# Patient Record
Sex: Female | Born: 1970 | Race: White | Hispanic: No | Smoking: Never smoker
Health system: Southern US, Community
[De-identification: ages and names within clinical notes are randomized; demographics above are authoritative.]

## PROBLEM LIST (undated history)

## (undated) DIAGNOSIS — K219 Gastro-esophageal reflux disease without esophagitis: Secondary | ICD-10-CM

## (undated) DIAGNOSIS — G629 Polyneuropathy, unspecified: Secondary | ICD-10-CM

## (undated) DIAGNOSIS — K589 Irritable bowel syndrome without diarrhea: Secondary | ICD-10-CM

## (undated) DIAGNOSIS — E119 Type 2 diabetes mellitus without complications: Secondary | ICD-10-CM

## (undated) DIAGNOSIS — N200 Calculus of kidney: Secondary | ICD-10-CM

## (undated) DIAGNOSIS — F341 Dysthymic disorder: Secondary | ICD-10-CM

## (undated) DIAGNOSIS — H269 Unspecified cataract: Secondary | ICD-10-CM

## (undated) DIAGNOSIS — E1149 Type 2 diabetes mellitus with other diabetic neurological complication: Secondary | ICD-10-CM

## (undated) DIAGNOSIS — R87629 Unspecified abnormal cytological findings in specimens from vagina: Secondary | ICD-10-CM

## (undated) DIAGNOSIS — E785 Hyperlipidemia, unspecified: Secondary | ICD-10-CM

## (undated) DIAGNOSIS — F419 Anxiety disorder, unspecified: Secondary | ICD-10-CM

## (undated) HISTORY — PX: CHOLECYSTECTOMY: SHX55

## (undated) HISTORY — PX: ABDOMINOPLASTY: SUR9

## (undated) HISTORY — DX: Type 2 diabetes mellitus with other diabetic neurological complication: E11.49

## (undated) HISTORY — DX: Gastro-esophageal reflux disease without esophagitis: K21.9

## (undated) HISTORY — DX: Unspecified abnormal cytological findings in specimens from vagina: R87.629

## (undated) HISTORY — PX: COLONOSCOPY: SHX174

## (undated) HISTORY — DX: Anxiety disorder, unspecified: F41.9

## (undated) HISTORY — DX: Irritable bowel syndrome without diarrhea: K58.9

## (undated) HISTORY — DX: Hyperlipidemia, unspecified: E78.5

## (undated) HISTORY — DX: Polyneuropathy, unspecified: G62.9

## (undated) HISTORY — DX: Unspecified cataract: H26.9

## (undated) HISTORY — DX: Dysthymic disorder: F34.1

## (undated) HISTORY — DX: Calculus of kidney: N20.0

---

## 1997-11-27 ENCOUNTER — Emergency Department (HOSPITAL_COMMUNITY): Admission: EM | Admit: 1997-11-27 | Discharge: 1997-11-27 | Payer: Self-pay | Admitting: Emergency Medicine

## 1998-10-31 ENCOUNTER — Encounter: Payer: Self-pay | Admitting: Gastroenterology

## 1998-10-31 ENCOUNTER — Ambulatory Visit (HOSPITAL_COMMUNITY): Admission: RE | Admit: 1998-10-31 | Discharge: 1998-10-31 | Payer: Self-pay | Admitting: Gastroenterology

## 1998-11-24 ENCOUNTER — Encounter: Payer: Self-pay | Admitting: Emergency Medicine

## 1998-11-24 ENCOUNTER — Emergency Department (HOSPITAL_COMMUNITY): Admission: EM | Admit: 1998-11-24 | Discharge: 1998-11-24 | Payer: Self-pay | Admitting: Emergency Medicine

## 1999-03-22 ENCOUNTER — Emergency Department (HOSPITAL_COMMUNITY): Admission: EM | Admit: 1999-03-22 | Discharge: 1999-03-23 | Payer: Self-pay | Admitting: Emergency Medicine

## 1999-09-05 ENCOUNTER — Encounter: Admission: RE | Admit: 1999-09-05 | Discharge: 1999-12-04 | Payer: Self-pay | Admitting: Internal Medicine

## 1999-09-12 ENCOUNTER — Other Ambulatory Visit: Admission: RE | Admit: 1999-09-12 | Discharge: 1999-09-12 | Payer: Self-pay | Admitting: Gastroenterology

## 2006-01-04 ENCOUNTER — Emergency Department (HOSPITAL_COMMUNITY): Admission: EM | Admit: 2006-01-04 | Discharge: 2006-01-05 | Payer: Self-pay | Admitting: Emergency Medicine

## 2007-04-13 ENCOUNTER — Emergency Department (HOSPITAL_COMMUNITY): Admission: EM | Admit: 2007-04-13 | Discharge: 2007-04-13 | Payer: Self-pay | Admitting: Emergency Medicine

## 2007-07-02 ENCOUNTER — Ambulatory Visit (HOSPITAL_COMMUNITY): Admission: RE | Admit: 2007-07-02 | Discharge: 2007-07-02 | Payer: Self-pay | Admitting: Podiatry

## 2007-07-04 ENCOUNTER — Ambulatory Visit (HOSPITAL_COMMUNITY): Admission: RE | Admit: 2007-07-04 | Discharge: 2007-07-04 | Payer: Self-pay | Admitting: Podiatry

## 2007-11-28 ENCOUNTER — Encounter (HOSPITAL_BASED_OUTPATIENT_CLINIC_OR_DEPARTMENT_OTHER): Admission: RE | Admit: 2007-11-28 | Discharge: 2007-12-24 | Payer: Self-pay | Admitting: Internal Medicine

## 2007-12-25 ENCOUNTER — Encounter (HOSPITAL_BASED_OUTPATIENT_CLINIC_OR_DEPARTMENT_OTHER): Admission: RE | Admit: 2007-12-25 | Discharge: 2008-03-22 | Payer: Self-pay | Admitting: Internal Medicine

## 2008-03-11 ENCOUNTER — Ambulatory Visit (HOSPITAL_COMMUNITY): Admission: RE | Admit: 2008-03-11 | Discharge: 2008-03-11 | Payer: Self-pay | Admitting: Internal Medicine

## 2008-06-29 ENCOUNTER — Encounter (HOSPITAL_BASED_OUTPATIENT_CLINIC_OR_DEPARTMENT_OTHER): Admission: RE | Admit: 2008-06-29 | Discharge: 2008-08-12 | Payer: Self-pay | Admitting: Internal Medicine

## 2008-12-03 ENCOUNTER — Encounter (HOSPITAL_BASED_OUTPATIENT_CLINIC_OR_DEPARTMENT_OTHER): Admission: RE | Admit: 2008-12-03 | Discharge: 2009-01-31 | Payer: Self-pay | Admitting: General Surgery

## 2009-04-29 ENCOUNTER — Encounter
Admission: RE | Admit: 2009-04-29 | Discharge: 2009-07-28 | Payer: Self-pay | Admitting: Physical Medicine & Rehabilitation

## 2009-05-02 ENCOUNTER — Ambulatory Visit: Payer: Self-pay | Admitting: Advanced Practice Midwife

## 2009-05-02 ENCOUNTER — Inpatient Hospital Stay (HOSPITAL_COMMUNITY): Admission: AD | Admit: 2009-05-02 | Discharge: 2009-05-02 | Payer: Self-pay | Admitting: Nephrology

## 2009-05-06 ENCOUNTER — Emergency Department (HOSPITAL_COMMUNITY): Admission: EM | Admit: 2009-05-06 | Discharge: 2009-05-06 | Payer: Self-pay | Admitting: Emergency Medicine

## 2009-05-06 ENCOUNTER — Ambulatory Visit: Payer: Self-pay | Admitting: Physical Medicine & Rehabilitation

## 2009-05-20 ENCOUNTER — Ambulatory Visit: Payer: Self-pay | Admitting: Physical Medicine & Rehabilitation

## 2009-06-28 ENCOUNTER — Ambulatory Visit: Payer: Self-pay | Admitting: Physical Medicine & Rehabilitation

## 2009-07-27 ENCOUNTER — Emergency Department (HOSPITAL_COMMUNITY): Admission: EM | Admit: 2009-07-27 | Discharge: 2009-07-27 | Payer: Self-pay | Admitting: Emergency Medicine

## 2009-08-03 ENCOUNTER — Encounter
Admission: RE | Admit: 2009-08-03 | Discharge: 2009-08-05 | Payer: Self-pay | Admitting: Physical Medicine & Rehabilitation

## 2009-08-05 ENCOUNTER — Ambulatory Visit: Payer: Self-pay | Admitting: Physical Medicine & Rehabilitation

## 2009-08-05 ENCOUNTER — Encounter (HOSPITAL_BASED_OUTPATIENT_CLINIC_OR_DEPARTMENT_OTHER): Admission: RE | Admit: 2009-08-05 | Discharge: 2009-11-03 | Payer: Self-pay | Admitting: Internal Medicine

## 2009-08-08 ENCOUNTER — Encounter: Payer: Self-pay | Admitting: Family Medicine

## 2009-08-08 DIAGNOSIS — F325 Major depressive disorder, single episode, in full remission: Secondary | ICD-10-CM | POA: Insufficient documentation

## 2009-08-08 DIAGNOSIS — E118 Type 2 diabetes mellitus with unspecified complications: Secondary | ICD-10-CM | POA: Insufficient documentation

## 2009-08-08 DIAGNOSIS — E1152 Type 2 diabetes mellitus with diabetic peripheral angiopathy with gangrene: Secondary | ICD-10-CM

## 2009-08-08 DIAGNOSIS — F341 Dysthymic disorder: Secondary | ICD-10-CM

## 2009-08-08 DIAGNOSIS — E1149 Type 2 diabetes mellitus with other diabetic neurological complication: Secondary | ICD-10-CM

## 2009-08-08 HISTORY — DX: Dysthymic disorder: F34.1

## 2009-08-08 HISTORY — DX: Type 2 diabetes mellitus with other diabetic neurological complication: E11.49

## 2009-08-12 ENCOUNTER — Ambulatory Visit (HOSPITAL_COMMUNITY)
Admission: RE | Admit: 2009-08-12 | Discharge: 2009-08-12 | Payer: Self-pay | Source: Home / Self Care | Admitting: Internal Medicine

## 2009-08-19 ENCOUNTER — Ambulatory Visit: Payer: Self-pay | Admitting: Family Medicine

## 2009-08-19 ENCOUNTER — Encounter: Payer: Self-pay | Admitting: Family Medicine

## 2009-09-22 ENCOUNTER — Encounter: Payer: Self-pay | Admitting: *Deleted

## 2009-09-22 ENCOUNTER — Encounter (INDEPENDENT_AMBULATORY_CARE_PROVIDER_SITE_OTHER): Payer: Self-pay | Admitting: Family Medicine

## 2009-10-05 ENCOUNTER — Ambulatory Visit (HOSPITAL_COMMUNITY): Admission: RE | Admit: 2009-10-05 | Discharge: 2009-10-05 | Payer: Self-pay | Admitting: General Surgery

## 2009-10-10 ENCOUNTER — Encounter: Payer: Self-pay | Admitting: Family Medicine

## 2009-10-11 ENCOUNTER — Encounter
Admission: RE | Admit: 2009-10-11 | Discharge: 2009-10-11 | Payer: Self-pay | Source: Home / Self Care | Admitting: Physical Medicine & Rehabilitation

## 2009-10-11 ENCOUNTER — Ambulatory Visit: Payer: Self-pay | Admitting: Physical Medicine & Rehabilitation

## 2009-12-27 ENCOUNTER — Telehealth: Payer: Self-pay | Admitting: *Deleted

## 2009-12-28 ENCOUNTER — Encounter
Admission: RE | Admit: 2009-12-28 | Discharge: 2010-03-22 | Payer: Self-pay | Source: Home / Self Care | Attending: Physical Medicine & Rehabilitation | Admitting: Physical Medicine & Rehabilitation

## 2010-01-13 ENCOUNTER — Ambulatory Visit: Payer: Self-pay | Admitting: Physical Medicine & Rehabilitation

## 2010-02-02 ENCOUNTER — Encounter: Payer: Self-pay | Admitting: Family Medicine

## 2010-02-02 ENCOUNTER — Ambulatory Visit: Payer: Self-pay | Admitting: Family Medicine

## 2010-02-02 DIAGNOSIS — M549 Dorsalgia, unspecified: Secondary | ICD-10-CM | POA: Insufficient documentation

## 2010-02-02 DIAGNOSIS — K589 Irritable bowel syndrome without diarrhea: Secondary | ICD-10-CM

## 2010-02-02 HISTORY — DX: Irritable bowel syndrome, unspecified: K58.9

## 2010-02-14 ENCOUNTER — Ambulatory Visit: Payer: Self-pay | Admitting: Physical Medicine & Rehabilitation

## 2010-02-20 ENCOUNTER — Telehealth (INDEPENDENT_AMBULATORY_CARE_PROVIDER_SITE_OTHER): Payer: Self-pay | Admitting: *Deleted

## 2010-02-23 ENCOUNTER — Encounter: Payer: Self-pay | Admitting: Family Medicine

## 2010-03-08 ENCOUNTER — Ambulatory Visit: Payer: Self-pay | Admitting: Physical Medicine & Rehabilitation

## 2010-03-27 ENCOUNTER — Encounter
Admission: RE | Admit: 2010-03-27 | Discharge: 2010-04-25 | Payer: Self-pay | Source: Home / Self Care | Attending: Physical Medicine & Rehabilitation | Admitting: Physical Medicine & Rehabilitation

## 2010-03-28 ENCOUNTER — Ambulatory Visit
Admission: RE | Admit: 2010-03-28 | Discharge: 2010-03-28 | Payer: Self-pay | Source: Home / Self Care | Attending: Physical Medicine & Rehabilitation | Admitting: Physical Medicine & Rehabilitation

## 2010-04-16 ENCOUNTER — Encounter: Payer: Self-pay | Admitting: Obstetrics

## 2010-04-16 ENCOUNTER — Encounter (HOSPITAL_BASED_OUTPATIENT_CLINIC_OR_DEPARTMENT_OTHER): Payer: Self-pay | Admitting: General Surgery

## 2010-04-16 ENCOUNTER — Encounter: Payer: Self-pay | Admitting: Family Medicine

## 2010-04-17 ENCOUNTER — Encounter: Payer: Self-pay | Admitting: Obstetrics

## 2010-04-18 ENCOUNTER — Ambulatory Visit
Admission: RE | Admit: 2010-04-18 | Discharge: 2010-04-18 | Payer: Self-pay | Source: Home / Self Care | Attending: Physical Medicine & Rehabilitation | Admitting: Physical Medicine & Rehabilitation

## 2010-04-25 NOTE — Progress Notes (Signed)
Summary: results  Phone Note Call from Patient Call back at Home Phone [phone]   Caller: Patient Summary of Call: pt is asking for results labswork Initial call taken by: De Nurse,  February 20, 2010 11:11 AM  Follow-up for Phone Call        Pt calling back to inquire why she hasn't been contacted regarding recent labs,and referrals for cardiology and  gi  Follow-up by: Abundio Miu,  February 22, 2010 3:58 PM  Additional Follow-up for Phone Call Additional follow up Details #1::        settng up the appts now.  Will forward to MD Additional Follow-up by: Jone Baseman CMA,  February 23, 2010 11:42 AM    Additional Follow-up for Phone Call Additional follow up Details #2::    LMOVM for pt to call back.  Her GI appt is @ [facility] for 12.6.11 @ 9:30am.  And we have sent the referral to [facility] and vascular for appt.  They will contact her.  Britta Mccreedy informed pt of the above. Pt awaiting MD call Follow-up by: Jone Baseman CMA,  February 23, 2010 11:56 AM   Sent letter informing her of results

## 2010-04-25 NOTE — Letter (Signed)
Summary: Generic Letter  [facility]  [address]   [place], [place] [address]   Phone: [phone]  Fax: [phone]    02/23/2010  TAMEKO HALDER [address] [place], [place]  [address]  Dear Ms. Papania,    I have reviewed your recent labs and everything looks normal, which is great news!  Your appointment with GI has been scheduled for 02-28-10 at 9:30am.  The cardiology office that we contaced will be calling you to set up an appointment soon.  Please call the office if you have further questions.       Sincerely,   Alvia Grove DO   Appended Document: Generic Letter mailed

## 2010-04-25 NOTE — Progress Notes (Signed)
Summary: refill & referral  Phone Note Call from Patient Call back at Home Phone [phone] Message from:  Patient  Refills Requested: Medication #1:  GLIPIZIDE XL 5 MG XR24H-TAB one daily  Medication #2:  GLUCOPHAGE 1000 MG TABS one by mouth two times a day [facility] [address]   Initial call taken by: De Nurse,  December 27, 2009 3:03 PM Caller: Patient Summary of Call: pt is needing to be refered to GI doc (for colonoscopy) & neurologist at [facility]- Dr Yvonna Alanis - for her diabetic neuropathy [phone]) pt has medicaid and needs auth for her to go  also needs to be referred to sleep clinic for sleep apnea and stress test this is all from previous visit late afternoon preferable  Initial call taken by: De Nurse,  December 27, 2009 3:05 PM  Follow-up for Phone Call        Called and LMOVM for pt to call back and schedule appt for all these referrals requested.  Was last seen in May and noshowed her physical. Follow-up by: Jone Baseman CMA,  December 28, 2009 12:08 PM  Additional Follow-up for Phone Call Additional follow up Details #1::        I refilled 30 days worth & asked front office to make her an appt here asap Additional Follow-up by: Golden Circle RN,  January 02, 2010 3:57 PM

## 2010-04-25 NOTE — Assessment & Plan Note (Signed)
Summary: f/up,tcb   Vital Signs:  Patient profile:   40 year old female Weight:      279.31 pounds Temp:     98.3 degrees F Pulse rate:   81 / minute BP sitting:   135 / 76  Vitals Entered By: Jone Baseman CMA (February 02, 2010 4:20 PM) CC: f/u Is Patient Diabetic? Yes Did you bring your meter with you today? No Pain Assessment Patient in pain? yes     Location: all over Intensity: 4   Primary Care Provider:  . [facility]  CC:  f/u.  History of Present Illness: 40 yo female, last seen by Dr. Donia Pounds several months ago.  Has multiple concerns today.  Ist visit with me. 1.  History of DM2 (dx 10 years ago) with diabetic neuropathy of hands and feet.  Previously seeing Dr. Jeri Cos.  Has not had labs in >1 year.  States A1c has been 5 for last few years.  Fastings 95-109 per patient.   2. Chronic pain: Unknown cause,  sees Pain Mgmt/Dr. Larna Daughters and is on Percocet and Klonopin (did not tolerate Lyrica or Tramadol).   She has a diabetic wound, R great toe. Casted  in wound clinic. No osteomylitis on Xray per patient.   3. Hx of kidney stones, seen at [facility] ER.  Pain resolved but she did not strain her urine so is not sure if stone passed (2x3x71mm).  She was put on Flomax and told to follow up with Urology.  At that ER visit there was an incidental finding of an adrenal mass (17mm, likely adenoma), needs further eval with CT w/o contrast.   4. Patient sees Dr. Clearance Coots for gyn care - due for pap and mammo.   5..Patient also c/o chronic diarrhea - states whatever she eats or drinks, she has a loose watery BM in 1 hour.  Would like GI referral for poss IBS.  She also had a C-scope many years ago which was normal. She has family h/o Colon CA (MGM). 6.. Hx of methadone use.  Requesting cardiology referral.  No chest pain, but feels she may have caused heart problems due to her chronic use.   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 One By Mouth Two Times A Day 2)  Paxil 40 Mg Tabs (Paroxetine Hcl) .Marland Kitchen.. 1 1/2 By Mouth Daily 3)  Enalapril Maleate 20 Mg Tabs (Enalapril Maleate) .... One Daily 4)  Clonazepam 1 Mg Tabs (Clonazepam) .... One Daily 5)  Paxil 40 Mg Tabs (Paroxetine Hcl) .... 1.5 Tabs By Mouth Daily 6)  Hydrocodone-Acetaminophen 5-325 Mg Tabs (Hydrocodone-Acetaminophen) .... One Tab By Mouth Qid 7)  Glipizide 5 Mg Tabs (Glipizide) .Marland Kitchen.. 1 Pill By Mouth Daily  Allergies (verified): 1)  ! Codeine 2)  ! Vancomycin  Past History:  Past Medical History: Last updated: 08/08/2009 h/o methadone use for neuropathic pain, Pain specialist took off and currently  trying new modalities (Kirsteins) T2DM with neuropathy  Cholesterol checked and pap at [facility] 2010. Mammogram at [facility] 1996  Past Surgical History: Last updated: 08/08/2009 Abdominoplasty (2000) Cholecystectomy (2003)  Social History: Last updated: 02/02/2010 Lives in [place] with daughter Lillia Abed 1995) and mother Lurena Joiner [phone]).  Has 2 dogs, 1 cat, vegetarian, caucasian, has BA, separated, renting.  Denies ever EtOH, drugs, tobacco  Risk Factors: Alcohol Use: 0 (02/02/2010)  Risk Factors: Smoking Status: never (02/02/2010)   Prevention & Chronic Care Immunizations   Influenza vaccine: Not documented    Tetanus booster: Not documented    Pneumococcal vaccine: Not  documented  Other Screening   Pap smear: Not documented    Mammogram: Not documented   Mammogram action/deferral: Ordered  (02/02/2010)  Reports requested:   Last colonoscopy report requested.   Last Pap report requested.  Smoking status: never  (02/02/2010)  Diabetes Mellitus   HgbA1C: Not documented    Eye exam: Not documented    Foot exam: Not documented   High risk foot: Not documented   Foot care education: Not documented    Urine microalbumin/creatinine ratio: Not documented    Diabetes flowsheet reviewed?: Yes   Progress toward A1C goal: At goal  Lipids   Total Cholesterol: Not documented   LDL: Not documented   LDL Direct: Not documented   HDL: Not documented   Triglycerides: Not documented  Self-Management Support :    Diabetes self-management support: Not documented   Nursing Instructions: Schedule screening mammogram (see order) Request report of last Pap Request report of last colonoscopy   Social History: Lives in [place] with daughter Lillia Abed 1995) and mother Lurena Joiner [phone]).  Has 2 dogs, 1 cat, vegetarian, caucasian, has BA, separated, renting.  Denies ever EtOH, drugs, tobacco  Review of Systems       see hpi  Physical Exam  General:  vs reviewed alert and well-developed.   Lungs:  Normal respiratory effort, chest expands symmetrically. Lungs are clear to auscultation, no crackles or wheezes. Heart:  Normal rate and regular rhythm. S1 and S2 normal without gallop, murmur, click, rub or other extra sounds. Extremities:  in splint Neurologic:  alert & oriented X3 and cranial nerves II-XII intact.   Psych:  moderately anxious and judgment fair.     Impression & Recommendations:  Problem # 1:  LONG-TERM (CURRENT) USE OF OTHER MEDICATIONS (ICD-V58.69) history of methadone use. Pt has alot of anxiety today.  Complains of chest apin in the past, not currently.  Mother present today feels that most of her pain is situational.   Orders: Cardiology Referral (Cardiology) [facility]- Est  Level 4 ([address])  Problem # 2:  BACK PAIN, CHRONIC (ICD-724.5) per pain clinic Her updated medication list for this problem includes:    Hydrocodone-acetaminophen 5-325 Mg Tabs (Hydrocodone-acetaminophen) ..... One tab by mouth qid  Orders: [facility]- Est  Level 4 ([address])  Problem # 3:  IBS (ICD-564.1) Pt endorses hx of colonc ca in multiple relatives. Referr to GI for colonoscopy. Orders: [facility]- Est  Level 4 ([address])  Problem # 4:  BENIGN NEOPLASM OF ADRENAL GLAND (ICD-227.0) will need f/u CT to re-evaluate Orders: Sed Rate (ESR)-[facility] ([address]657) ANA-[facility] ([id number])  Complete Medication List: 1)  Glucophage 1000 Mg Tabs (Metformin hcl) .... One by mouth two times a day 2)  Paxil 40 Mg Tabs (Paroxetine hcl) .Marland Kitchen.. 1 1/2 by mouth daily 3)  Enalapril Maleate 20 Mg Tabs (Enalapril maleate) .... One daily 4)  Clonazepam 1 Mg Tabs (Clonazepam) .... One  daily 5)  Paxil 40 Mg Tabs (Paroxetine hcl) .... 1.5 tabs by mouth daily 6)  Hydrocodone-acetaminophen 5-325 Mg Tabs (Hydrocodone-acetaminophen) .... One tab by mouth qid 7)  Glipizide 5 Mg Tabs (Glipizide) .Marland Kitchen.. 1 pill by mouth daily  Other Orders: Mammogram (Screening) (Mammo) Gastroenterology Referral (GI)  Patient Instructions: 1)  Please stop the topamax as we discussed. 2)  I am going to draw some labs today.  I will get in touch with you when I get the results, this may take a few days. 3)  Please get your mammogram. 4)  I will send you to GI for a colonoscopy 5)  I will also send you to cardiology for your chest discomfort 6)  Please make an appointment with Dr. Pascal Lux for CBT 7)  Please follow up with me in 4-6 weeks Prescriptions: ENALAPRIL MALEATE 20 MG TABS (ENALAPRIL MALEATE) one daily  #30 x 5   Entered and Authorized by:   Alvia Grove DO   Signed by:   Alvia Grove DO on 02/02/2010   Method used:   Electronically to        [facility] [address] # [phone]* (retail)        [address]       [place], [place]  [address]       Ph: [phone]       Fax: [phone]   RxID:   [id number] PAXIL 40 MG TABS (PAROXETINE HCL) 1 1/2 by mouth daily  #90 x 5   Entered and Authorized by:   Alvia Grove DO   Signed by:   Alvia Grove DO on 02/02/2010   Method used:   Electronically to        [facility] [address] # [phone]* (retail)       [address]       [place], [place]  [address]       Ph: [phone]       Fax: [phone]   RxID:   [id number] GLUCOPHAGE 1000 MG TABS (METFORMIN HCL) one by mouth two times a day  #60 x 5   Entered and Authorized by:   Alvia Grove DO   Signed by:   Alvia Grove DO on 02/02/2010   Method used:   Electronically to        [facility] [address] # [phone]* (retail)       [address]       [place], [place]  [address]       Ph: [phone]       Fax: [phone]   RxID:   [id number] GLIPIZIDE 5 MG TABS (GLIPIZIDE) 1 pill by mouth daily  #30 x 1   Entered and Authorized by:   Alvia Grove DO   Signed by:   Alvia Grove DO on 02/02/2010   Method used:   Electronically to        [facility] [address] # [phone]* (retail)       [address]       [place], [place]  [address]       Ph: [phone]       Fax: [phone]   RxID:   [id number]    Orders Added: 1)  Mammogram (Screening) [Mammo] 2)  Sed Rate (ESR)-[facility] [85651] 3)  [facility] [[id number]] 4)  Gastroenterology Referral [GI] 5)  Cardiology Referral [Cardiology] 6)  [facility]- Est  Level 4 [[address]]

## 2010-04-25 NOTE — Consult Note (Signed)
Summary: [facility]  [facility]   Imported By: Bradly Bienenstock 10/06/2009 13:36:24  _____________________________________________________________________  External Attachment:    Type:   Image     Comment:   External Document

## 2010-04-25 NOTE — Miscellaneous (Signed)
Summary: eye exam  Clinical Lists Changes gave NPI for pt's diabetic eye exam ([phone])...Marland KitchenMarland KitchenGolden Circle RN  September 22, 2009 4:54 PM

## 2010-04-25 NOTE — Assessment & Plan Note (Signed)
Summary: New Patient   Vital Signs:  Patient profile:   40 year old female Temp:     98.6 degrees F oral Pulse rate:   97 / minute BP sitting:   138 / 79  (left arm) Cuff size:   regular  Vitals Entered By: Garen Grams LPN (Aug 19, 2009 3:40 PM) CC: New PAtient Is Patient Diabetic? Yes Did you bring your meter with you today? No Pain Assessment Patient in pain? no        CC:  New PAtient.  History of Present Illness: Patient here to establish care.  History of DM2 (dx 10 years ago) with diabetic neuropathy of hands and feet.  Previously seeing Dr. Jeri Cos.  Has not had labs in >1 year.  States A1c has been 5 for last few years.  Fastings 95-109 per patient.  For neuropathy she sees Pain Mgmt/Dr. Larna Daughters and is on Vicodin and Klonopin (did not tolerate Lyrica or Tramadol).    She has a diabetic wound, R great toe. Casted today in wound clinic. No osteomylitis on Xray per patient.   She recently had a kidney stone, seen at [facility] ER.  Pain resolved but she did not strain her urine so is not sure if stone passed (2x3x63mm).  She was put on Flomax and told to follow up with Urology.  At that ER visit there was an incidental finding of an adrenal mass (17mm, likely adenoma), needs further eval with CT w/o contrast.   Patient sees Dr. Clearance Coots for gyn care - due for pap and mammo.   Patient also c/o chronic diarrhea - states whatever she eats or drinks, she has a loose watery BM in 1 hour.  Would like GI referral for poss IBS.  She also had a C-scope many years ago which was normal. She has family h/o Colon CA (MGM).   Habits & Providers  Alcohol-Tobacco-Diet     Tobacco Status: never  Allergies: 1)  ! Codeine 2)  ! Vancomycin  Past History:  Past Medical History: Last updated: 08/08/2009 h/o methadone use for neuropathic pain, Pain specialist took off and currently trying new modalities (Kirsteins) T2DM with neuropathy  Cholesterol checked and pap at [facility]  2010. Mammogram at [facility] 1996  Past Surgical History: Last updated: 08/08/2009 Abdominoplasty (2000) Cholecystectomy (2003)  Social History: Last updated: 08/08/2009 Lives in [place] with daughter Lillia Abed 1995) and mother Lurena Joiner [phone]).  Has 2 dogs, 1 cat, vegetarian, caucasian, has BA in ___, separated, renting.  Denies ever EtOH, drugs, tobacco   Impression & Recommendations:  Problem # 1:  DIABETES MELLITUS, TYPE II, WITH NEUROLOGICAL COMPLICATIONS (ICD-250.60) Will check labs and advise patient to keep log x 2 weeks. Will follow up in 2 weeks for results.   Her updated medication list for this problem includes:    Glucophage 1000 Mg Tabs (Metformin hcl) ..... One by mouth two times a day    Glipizide Xl 5 Mg Xr24h-tab (Glipizide) ..... One daily    Enalapril Maleate 20 Mg Tabs (Enalapril maleate) ..... One daily  Future Orders: Lipid-[facility] ([id number]) ... 08/20/2010 Comp Met-[facility] ([id number]) ... 08/20/2010 A1C-[facility] (01027) ... 08/20/2010 Vit D, 25 OH-[facility] ([id number]) ... 08/20/2010 TSH-[facility] [phone]) ... 08/20/2010 UA Microalbumin-[facility] (56433) ... 08/20/2010  Problem # 2:  BENIGN NEOPLASM OF ADRENAL GLAND (ICD-227.0) Will evaluate further with CT w/o contrast  Orders: CT without Contrast (CT w/o contrast)  Problem # 3:  IRRITABLE BOWEL SYNDROME (ICD-564.1) Requesting GI referral for poss IBS and desires C-scope  Orders: Gastroenterology Referral (GI)  Problem # 4:  OPEN WOUND OF TOE, COMPLICATED (ICD-893.1) Continue follow up at Wound Clinic  Problem # 5:  DIABETIC PERIPHERAL NEUROPATHY (ICD-250.60) Will refill Topamax  Her updated medication list for this problem includes:    Glucophage 1000 Mg Tabs (Metformin hcl) ..... One by mouth two times a day    Glipizide Xl 5 Mg Xr24h-tab (Glipizide) ..... One daily    Enalapril Maleate 20 Mg Tabs (Enalapril maleate) ..... One daily  Problem # 6:  DEPRESSION/ANXIETY (ICD-300.4) Will refill Paxil. States doing  well. No SI/HI. Also has h/o OCD - med is controlling symptoms well per patient.  Complete Medication List: 1)  Glucophage 1000 Mg Tabs (Metformin hcl) .... One by mouth two times a day 2)  Paxil 40 Mg Tabs (Paroxetine hcl) .Marland Kitchen.. 1 1/2 by mouth daily 3)  Glipizide Xl 5 Mg Xr24h-tab (Glipizide) .... One daily 4)  Enalapril Maleate 20 Mg Tabs (Enalapril maleate) .... One daily 5)  Topamax 200 Mg Tabs (Topiramate) .... One daily 6)  Clonazepam 1 Mg Tabs (Clonazepam) .... One daily 7)  Topamax 200 Mg Tabs (Topiramate) .... One tablet daily 8)  Paxil 40 Mg Tabs (Paroxetine hcl) .... 1.5 tabs by mouth daily 9)  Hydrocodone-acetaminophen 5-325 Mg Tabs (Hydrocodone-acetaminophen) .... One tab by mouth qid Prescriptions: PAXIL 40 MG TABS (PAROXETINE HCL) 1.5 tabs by mouth daily  #45 x 11   Entered and Authorized by:   Jettie Pagan MD   Signed by:   Jettie Pagan MD on 08/19/2009   Method used:   Electronically to        [facility] [address] # [phone]* (retail)       [address]       [place], [place]  [address]       Ph: [phone]       Fax: [phone]   RxID:   [id number] TOPAMAX 200 MG TABS (TOPIRAMATE) one tablet daily  #30 x 11   Entered and Authorized by:   Jettie Pagan MD   Signed by:   Jettie Pagan MD on 08/19/2009   Method used:   Electronically to        [facility] [address] # [phone]* (retail)       [address]       [place], [place]  [address]       Ph: [phone]       Fax: [phone]   RxID:   [id number]

## 2010-04-25 NOTE — Miscellaneous (Signed)
Summary: NP input  Clinical Lists Changes  Problems: Added new problem of DEPRESSION/ANXIETY (ICD-300.4) Added new problem of DIABETES MELLITUS, TYPE II, WITH NEUROLOGICAL COMPLICATIONS (ICD-250.60) Added new problem of DIABETIC PERIPHERAL NEUROPATHY (ICD-250.60) Medications: Added new medication of GLUCOPHAGE 1000 MG TABS (METFORMIN HCL) one by mouth two times a day Added new medication of PAXIL 40 MG TABS (PAROXETINE HCL) 1 1/2 by mouth daily Added new medication of GLIPIZIDE XL 5 MG XR24H-TAB (GLIPIZIDE) one daily Added new medication of ENALAPRIL MALEATE 20 MG TABS (ENALAPRIL MALEATE) one daily Added new medication of TOPAMAX 200 MG TABS (TOPIRAMATE) one daily Added new medication of CLONAZEPAM 1 MG TABS (CLONAZEPAM) one daily Allergies: Added new allergy or adverse reaction of CODEINE Added new allergy or adverse reaction of VANCOMYCIN Observations: Added new observation of STD: never (08/08/2009 17:21) Added new observation of SMOK STATUS: never (08/08/2009 17:21) Added new observation of ALCOHOL USE: 0 /day (08/08/2009 17:21) Added new observation of DRUG USE: never  (08/08/2009 17:21) Added new observation of SEATBELT USE: always % (08/08/2009 17:21) Added new observation of SOCIAL HX: Lives in [place] with daughter Lillia Abed 1995) and mother Lurena Joiner [phone]).  Has 2 dogs, 1 cat, vegetarian, caucasian, has BA in ___, separated, renting.  Denies ever EtOH, drugs, tobacco  (08/08/2009 17:21) Added new observation of PAST SURG HX: Abdominoplasty (2000) Cholecystectomy (2003)  (08/08/2009 17:21) Added new observation of NKA: F  (08/08/2009 17:21) Added new observation of PAST MED HX: h/o methadone use for neuropathic pain, Pain specialist took off and currently trying new modalities (Kirsteins) T2DM with neuropathy  Cholesterol checked and pap at [facility] 2010. Mammogram at [place] 1996  (08/08/2009 17:21) Added new observation of PAINMGMTMD: Kirsteins  (08/08/2009  17:21)      Habits & Providers  Alcohol-Tobacco-Diet     Alcohol drinks/day: 0     Tobacco Status: never  Exercise-Depression-Behavior     STD Risk: never     Drug Use: never     Seat Belt Use: always     Pain Management Provider: Kirsteins    Past History:  Past Medical History: h/o methadone use for neuropathic pain, Pain specialist took off and currently trying new modalities (Kirsteins) T2DM with neuropathy  Cholesterol checked and pap at [facility] 2010. Mammogram at [facility]  Past Surgical History: Abdominoplasty (2000) Cholecystectomy (2003)   Social History: Lives in [place] with daughter Lillia Abed 1995) and mother Lurena Joiner [phone]).  Has 2 dogs, 1 cat, vegetarian, caucasian, has BA in ___, separated, renting.  Denies ever EtOH, drugs, tobaccoSeat Belt Use:  always Drug Use:  never Smoking Status:  never STD Risk:  never

## 2010-04-25 NOTE — Consult Note (Signed)
Summary: [facility]  [facility]   Imported By: Knox Royalty 10/15/2009 13:09:55  _____________________________________________________________________  External Attachment:    Type:   Image     Comment:   External Document

## 2010-05-30 ENCOUNTER — Ambulatory Visit (HOSPITAL_BASED_OUTPATIENT_CLINIC_OR_DEPARTMENT_OTHER): Payer: Medicaid Other | Admitting: Physical Medicine & Rehabilitation

## 2010-05-30 ENCOUNTER — Encounter: Payer: Medicaid Other | Attending: Physical Medicine & Rehabilitation

## 2010-05-30 DIAGNOSIS — E119 Type 2 diabetes mellitus without complications: Secondary | ICD-10-CM | POA: Insufficient documentation

## 2010-05-30 DIAGNOSIS — R209 Unspecified disturbances of skin sensation: Secondary | ICD-10-CM | POA: Insufficient documentation

## 2010-05-30 DIAGNOSIS — M79609 Pain in unspecified limb: Secondary | ICD-10-CM

## 2010-05-30 DIAGNOSIS — G609 Hereditary and idiopathic neuropathy, unspecified: Secondary | ICD-10-CM

## 2010-05-30 DIAGNOSIS — G8929 Other chronic pain: Secondary | ICD-10-CM | POA: Insufficient documentation

## 2010-05-30 DIAGNOSIS — Z79899 Other long term (current) drug therapy: Secondary | ICD-10-CM | POA: Insufficient documentation

## 2010-06-08 ENCOUNTER — Telehealth: Payer: Self-pay | Admitting: Family Medicine

## 2010-06-08 NOTE — Telephone Encounter (Signed)
Pt has medicaid & needs an rx for new machine & test strips, needs to be sure its one medicaid covers.

## 2010-06-09 NOTE — Telephone Encounter (Signed)
Left message on patient voicemail, instructing her to call back and schedule an appointment.

## 2010-06-09 NOTE — Telephone Encounter (Signed)
Last MD seen was Alvia Grove in November of last year, will forward to Dr. Deirdre Priest.

## 2010-06-09 NOTE — Telephone Encounter (Signed)
If she has not been seen since November and has DM she needs an appt first.  Can be sched with any blue team doc thnaks [name] L

## 2010-06-13 LAB — URINE MICROSCOPIC-ADD ON

## 2010-06-13 LAB — URINALYSIS, ROUTINE W REFLEX MICROSCOPIC
Glucose, UA: NEGATIVE mg/dL
Nitrite: NEGATIVE
Protein, ur: NEGATIVE mg/dL
Specific Gravity, Urine: 1.036 — ABNORMAL HIGH (ref 1.005–1.030)
pH: 5.5 (ref 5.0–8.0)

## 2010-06-13 LAB — GC/CHLAMYDIA PROBE AMP, GENITAL: Chlamydia, DNA Probe: NEGATIVE

## 2010-06-13 LAB — CK TOTAL AND CKMB (NOT AT ARMC)
CK, MB: 0.8 ng/mL (ref 0.3–4.0)
Relative Index: INVALID (ref 0.0–2.5)
Total CK: 19 U/L (ref 7–177)

## 2010-06-13 LAB — BASIC METABOLIC PANEL
Calcium: 9.2 mg/dL (ref 8.4–10.5)
Creatinine, Ser: 1.06 mg/dL (ref 0.4–1.2)
GFR calc Af Amer: >60 mL/min (ref >60)
GFR calc non Af Amer: 57 mL/min — ABNORMAL LOW (ref >60)
Potassium: 4.1 mEq/L (ref 3.5–5.1)

## 2010-06-13 LAB — DIFFERENTIAL
Basophils Absolute: 0 10*3/uL (ref 0.0–0.1)
Monocytes Absolute: 0.6 10*3/uL (ref 0.1–1.0)

## 2010-06-13 LAB — WET PREP, GENITAL

## 2010-06-13 LAB — CBC
MCHC: 33.2 g/dL (ref 30.0–36.0)
MCV: 87.1 fL (ref 78.0–100.0)
Platelets: 315 10*3/uL (ref 150–400)
RBC: 4.87 MIL/uL (ref 3.87–5.11)
RDW: 14.6 % (ref 11.5–15.5)
WBC: 14.3 10*3/uL — ABNORMAL HIGH (ref 4.0–10.5)

## 2010-06-13 LAB — TROPONIN I: Troponin I: 0.02 ng/mL (ref 0.00–0.06)

## 2010-06-14 ENCOUNTER — Ambulatory Visit: Payer: Self-pay | Admitting: Family Medicine

## 2010-06-14 LAB — RAPID URINE DRUG SCREEN, HOSP PERFORMED
Amphetamines: NOT DETECTED
Barbiturates: NOT DETECTED
Opiates: POSITIVE — AB
Tetrahydrocannabinol: NOT DETECTED

## 2010-06-15 LAB — URINALYSIS, ROUTINE W REFLEX MICROSCOPIC
Bilirubin Urine: NEGATIVE
Hgb urine dipstick: NEGATIVE
Protein, ur: NEGATIVE mg/dL
Urobilinogen, UA: 0.2 mg/dL (ref 0.0–1.0)
pH: 5 (ref 5.0–8.0)

## 2010-06-15 LAB — DIFFERENTIAL
Basophils Relative: 0 % (ref 0–1)
Lymphocytes Relative: 27 % (ref 12–46)
Lymphs Abs: 2.5 10*3/uL (ref 0.7–4.0)
Monocytes Relative: 5 % (ref 3–12)
Neutro Abs: 6.4 10*3/uL (ref 1.7–7.7)

## 2010-06-15 LAB — D-DIMER, QUANTITATIVE: D-Dimer, Quant: <0.22 ug/mL-FEU (ref 0.00–0.48)

## 2010-06-15 LAB — BASIC METABOLIC PANEL
BUN: 10 mg/dL (ref 6–23)
Chloride: 102 mEq/L (ref 96–112)
GFR calc Af Amer: >60 mL/min (ref >60)
Glucose, Bld: 131 mg/dL — ABNORMAL HIGH (ref 70–99)
Sodium: 136 mEq/L (ref 135–145)

## 2010-06-15 LAB — URINE MICROSCOPIC-ADD ON

## 2010-06-15 LAB — CBC
Hemoglobin: 14.9 g/dL (ref 12.0–15.0)
MCHC: 34 g/dL (ref 30.0–36.0)
MCV: 88 fL (ref 78.0–100.0)
RBC: 4.99 MIL/uL (ref 3.87–5.11)
RDW: 14 % (ref 11.5–15.5)

## 2010-06-15 LAB — POCT PREGNANCY, URINE: Preg Test, Ur: NEGATIVE

## 2010-06-15 LAB — POCT CARDIAC MARKERS
CKMB, poc: <1 ng/mL — ABNORMAL LOW (ref 1.0–8.0)
Myoglobin, poc: 73 ng/mL (ref 12–200)

## 2010-06-20 ENCOUNTER — Telehealth: Payer: Self-pay | Admitting: Family Medicine

## 2010-06-20 NOTE — Telephone Encounter (Signed)
Pt scheduled for an appt on 4/20, but need a rx for a glucometer called [facility] on [address]

## 2010-06-21 MED ORDER — BLOOD GLUCOSE METER KIT: PACK | Status: AC

## 2010-06-21 NOTE — Assessment & Plan Note (Signed)
ADDENDUM  Greater than 50% of the 25-minute visit was spent in counseling and coordination of care.     Erick Colace, M.D. Electronically Signed    AEK/MedQ D:  04/18/2010 16:18:23  T:  04/19/2010 00:28:53  Job #:  [id number]

## 2010-06-30 LAB — GLUCOSE, CAPILLARY: Glucose-Capillary: 154 mg/dL — ABNORMAL HIGH (ref 70–99)

## 2010-07-05 ENCOUNTER — Ambulatory Visit (INDEPENDENT_AMBULATORY_CARE_PROVIDER_SITE_OTHER): Payer: Medicaid Other | Admitting: Family Medicine

## 2010-07-05 ENCOUNTER — Encounter: Payer: Self-pay | Admitting: Family Medicine

## 2010-07-05 VITALS — BP 135/84 | HR 90 | Temp 97.9°F | Ht 72.0 in | Wt 282.0 lb

## 2010-07-05 DIAGNOSIS — Z202 Contact with and (suspected) exposure to infections with a predominantly sexual mode of transmission: Secondary | ICD-10-CM

## 2010-07-05 DIAGNOSIS — E1149 Type 2 diabetes mellitus with other diabetic neurological complication: Secondary | ICD-10-CM

## 2010-07-05 DIAGNOSIS — J029 Acute pharyngitis, unspecified: Secondary | ICD-10-CM

## 2010-07-05 LAB — POCT RAPID STREP A (OFFICE): Rapid Strep A Screen: NEGATIVE

## 2010-07-05 LAB — GLUCOSE, CAPILLARY: Glucose-Capillary: 107 mg/dL — ABNORMAL HIGH (ref 70–99)

## 2010-07-05 MED ORDER — AZITHROMYCIN 1 G PO PACK: 1.0000 g | PACK | Freq: Once | ORAL | Status: DC

## 2010-07-05 MED ORDER — CEFTRIAXONE SODIUM 250 MG IJ SOLR: 250.0000 mg | Freq: Once | INTRAMUSCULAR | Status: DC
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
## 2010-07-05 NOTE — Patient Instructions (Addendum)
Return for your lab work Keep your appt with Dr. Lelon Perla to discuss your chronic medical problems Your A1C was 6.3%

## 2010-07-07 ENCOUNTER — Telehealth: Payer: Self-pay | Admitting: Family Medicine

## 2010-07-07 ENCOUNTER — Encounter: Payer: Medicaid Other | Attending: Physical Medicine & Rehabilitation

## 2010-07-07 ENCOUNTER — Ambulatory Visit (HOSPITAL_BASED_OUTPATIENT_CLINIC_OR_DEPARTMENT_OTHER): Payer: Medicaid Other | Admitting: Physical Medicine & Rehabilitation

## 2010-07-07 DIAGNOSIS — G8929 Other chronic pain: Secondary | ICD-10-CM | POA: Insufficient documentation

## 2010-07-07 DIAGNOSIS — E119 Type 2 diabetes mellitus without complications: Secondary | ICD-10-CM | POA: Insufficient documentation

## 2010-07-07 DIAGNOSIS — G609 Hereditary and idiopathic neuropathy, unspecified: Secondary | ICD-10-CM

## 2010-07-07 DIAGNOSIS — Z79899 Other long term (current) drug therapy: Secondary | ICD-10-CM | POA: Insufficient documentation

## 2010-07-07 DIAGNOSIS — E1349 Other specified diabetes mellitus with other diabetic neurological complication: Secondary | ICD-10-CM

## 2010-07-07 DIAGNOSIS — E1365 Other specified diabetes mellitus with hyperglycemia: Secondary | ICD-10-CM

## 2010-07-07 DIAGNOSIS — R209 Unspecified disturbances of skin sensation: Secondary | ICD-10-CM | POA: Insufficient documentation

## 2010-07-07 NOTE — Telephone Encounter (Signed)
Please let pt know her cultures were  Negative if she returns the call.

## 2010-07-09 LAB — GONOCOCCUS CULTURE: Organism ID, Bacteria: NO GROWTH

## 2010-07-10 ENCOUNTER — Other Ambulatory Visit: Payer: Self-pay | Admitting: Obstetrics

## 2010-07-10 DIAGNOSIS — Z1231 Encounter for screening mammogram for malignant neoplasm of breast: Secondary | ICD-10-CM

## 2010-07-10 NOTE — Assessment & Plan Note (Signed)
CHIEF COMPLAINT:  Pain, numbness, tingling, lower extremities greater than upper extremities.  A 40 year old female with 10-year history of numbness, tingling, started in lower extremities, progressed into the hands.  She has had chronic pain along with this.  She was diagnosed with diabetes; however, hemoglobin A1c levels have been less than 6 since 2006.  She has been on long-term metformin.  She has been experiencing progressive pain and weakness and numbness in her extremities despite good diabetic control. 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  EMG showed absent bilateral superficial peroneal, sural sensory, bilateral peroneal, and tibial motor conductions.  She had prolongation of median nerve sensory and motor distal latencies but normal ulnar, so that is in the upper extremities.  Lower extremity needle exam demonstrated 1+ positive sharp waves in the right tibialis anterior, right medial gastroc, left tibialis anterior, left medial gastroc,  bilaterally EDB, and had normal findings in the hamstring and quadriceps muscles.  She has had normal sed rate, normal ANA, normal CBC, LFTs, kidney function tests, serum B12 was normal.  Her serum protein electrophoresis showed minimal elevation in the beta 1 and beta 2.  Her pain score has been in the 7/10 range.  She has had some increasing problems with depression, anxiety.  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  She needs help with certain bathing, household duties, and shopping but is otherwise independent.  REVIEW OF SYSTEMS:  Numbness, tingling, trouble walking, spasms, depression anxiety but no suicidal thoughts.  She was approved for social security disability since last visit.  PHYSICAL EXAMINATION:  VITAL SIGNS:  Blood pressure is 128/53, pulse 80, respirations 18,  O2 sat 97% room air. GENERAL:  No acute distress.  Mood and affect appropriate. NEUROLOGIC:  She has intact proprioception in bilateral upper and lower extremities.  She has diminished pinprick sensation to the knees bilaterally but normal in the hands.  Her upper extremity strength is 5/5, deltoid, biceps, grip.  Her ankle dorsiflexor strength is 3- bilaterally and quads are 5.  She has bilateral foot and hand intrinsic atrophy with some clawing of the toes.  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  She will be going to [facility] of Neurology to further evaluate.  We will increase her oxycodone to 10 mg five times per day.  She states that her mother was afraid that the morphine was like methadone and she does not want her getting back on that kind of medicine anymore. Discussed with the patient, agrees with plan.  I will see her back in 1 month's time.  We will increase her Topamax to 200 at bedtime.     Erick Colace, M.D. Electronically Signed    AEK/MedQ D:  07/07/2010 16:33:45  T:  07/08/2010 05:07:50  Job #:  [id number]  cc:   Dr. Dan Humphreys  Dr. Leonides Schanz, MD

## 2010-07-11 ENCOUNTER — Telehealth: Payer: Self-pay | Admitting: *Deleted

## 2010-07-11 NOTE — Telephone Encounter (Signed)
Called pt. Informed of neg cx's. Arlyss Repress

## 2010-07-14 ENCOUNTER — Ambulatory Visit: Payer: Medicaid Other | Admitting: Family Medicine

## 2010-08-01 ENCOUNTER — Ambulatory Visit (HOSPITAL_COMMUNITY): Payer: Medicaid Other | Attending: Obstetrics

## 2010-08-03 ENCOUNTER — Ambulatory Visit (HOSPITAL_COMMUNITY): Payer: Medicaid Other | Admitting: Licensed Clinical Social Worker

## 2010-08-08 ENCOUNTER — Encounter: Payer: Medicaid Other | Attending: Physical Medicine & Rehabilitation

## 2010-08-08 ENCOUNTER — Ambulatory Visit (HOSPITAL_BASED_OUTPATIENT_CLINIC_OR_DEPARTMENT_OTHER): Payer: Medicaid Other | Admitting: Physical Medicine & Rehabilitation

## 2010-08-08 DIAGNOSIS — G894 Chronic pain syndrome: Secondary | ICD-10-CM

## 2010-08-08 DIAGNOSIS — R209 Unspecified disturbances of skin sensation: Secondary | ICD-10-CM | POA: Insufficient documentation

## 2010-08-08 DIAGNOSIS — G609 Hereditary and idiopathic neuropathy, unspecified: Secondary | ICD-10-CM

## 2010-08-08 DIAGNOSIS — E119 Type 2 diabetes mellitus without complications: Secondary | ICD-10-CM | POA: Insufficient documentation

## 2010-08-08 DIAGNOSIS — Z79899 Other long term (current) drug therapy: Secondary | ICD-10-CM | POA: Insufficient documentation

## 2010-08-08 DIAGNOSIS — G8929 Other chronic pain: Secondary | ICD-10-CM | POA: Insufficient documentation

## 2010-08-08 NOTE — Assessment & Plan Note (Signed)
[facility]   NAME:  KA, BENCH           ACCOUNT NO.:  [id number]   MEDICAL RECORD NO.:  [id number]      DATE OF BIRTH:  Mar 14, 1971   PHYSICIAN:  Maxwell Caul, M.D. VISIT DATE:  03/11/2008                                   OFFICE VISIT   Ms. Stallings is a lady that we have been following for a Wagner grade  2 diabetic neuropathic ulcer on the plantar aspect of her right great  toe.  She had been treated with an easy cast after the area was covered  with Puracol.  It required some debridement last week; however, she  returned to have [place] to review this today.   On examination, the easy cast was removed.  There is still some callus.  I did probe the callus and there may still be an area here that is  epithelialized, but not completely resolved.  However, this is  relatively minor.   IMPRESSION:  Wagner grade 2 diabetic foot ulcer.  I thought this was  minor enough to allow her to come out of the cast.  We recommended Dial  soap wash, Polysporin, a protective dressing Allevyn here, but she could  do a thick Band-Aid.  I have put her back in a healing sandal with a  felt offloading strip.  We will see this again after the holidays in 2  weeks.            ______________________________  Maxwell Caul, M.D.     MGR/MEDQ  D:  03/11/2008  T:  03/12/2008  Job:  [id number]

## 2010-08-08 NOTE — Group Therapy Note (Signed)
[name], [name]           ACCOUNT NO.:  [id number]   MEDICAL RECORD NO.:  [id number]         PATIENT TYPE:  HREC   LOCATION:                                 FACILITY:   PHYSICIAN:  Lenon Curt. Chilton Si, M.D.  DATE OF BIRTH:  1970-11-28                                 PROGRESS NOTE   HISTORY:  A 40 year old female returns today for further evaluation of a wound  under the right greater toe plantar surface.  The patient has had the  cast and this was removed today.  Wound does seem to have improved quite  a bit according to the patient.  There was no tenderness or pain.   PHYSICAL EXAMINATION:  Temperature 98.5, pulse 92, respirations 18, blood pressure 123/85.   The patient is diabetic.  Blood sugars today was 87 mg percent.  Wound  inspection shows measurements to be 0.2 x 0.3 x 0.2 under the right  great toe on the plantar surface.  There was some surrounding callus and  redundant skin.   Treatment:  The patient had sharp debridement with a scalpel of the  callus and redundant skin.  There was no bleeding.  The patient  tolerated the procedure well.  At the end of the procedure, the wound  was dressed with Puracol for the slight opening that was left a gauze  and Kerlix.  She will reapply bandages as needed at home.  She was  advised to leave the current bandage in place for a week and to protect  bandages against moisture.  We also recommended she use walking shoe for  offloading until seen again in 2 weeks.      Lenon Curt Chilton Si, M.D.  Electronically Signed     AGG/MEDQ  D:  01/09/2008  T:  01/10/2008  Job:  [id number]

## 2010-08-08 NOTE — Consult Note (Signed)
[name], [name]           ACCOUNT NO.:  [id number]   MEDICAL RECORD NO.:  [id number]         PATIENT TYPE:  HREC   LOCATION:  FOOT                         FACILITY:  [facility]   PHYSICIAN:  Jonelle Sports. Sevier, M.D. DATE OF BIRTH:  [birth year]   DATE OF CONSULTATION:  12/10/2007  DATE OF DISCHARGE:                                 CONSULTATION   HISTORY:  This 40 year old white female with type 2 diabetes and  apparently fairly severe peripheral neuropathy, which has been difficult  to manage and finds her currently on a methadone program as a plantar  ulcer underlying the proximal phalanx of her right hallux.  She had been  seen by podiatrist for this and they have noted that she has a tendency  toward clawing of the toes and have recommended straightening these toes  out surgically.  They have debrided the wound from time to time and at  one point, she apparently had MRSA infection and had a PICC line with an  extended series of intravenous antibiotic.  This was in April and May of  this year.   Most recently, she simply had topical management of the wound itself and  has shown no progress.  She has been in a flat healing sandal.   She is referred here now for our evaluation and advice.   PAST MEDICAL HISTORY:  Surgically, she has had cholecystectomy in 2001  and also has had an abdominoplasty for removal of panniculus in 2000.   She has had no other recent hospitalizations.   She has allergy to VANCOMYCIN.   Her regular medications include,  1. Glucophage 1000 mg b.i.d.  2. Enalapril 20 mg daily.  3. Topamax 200 mg daily.  4. Glipizide 5 mg daily.  5. Paxil 60 mg daily.  6. Methadone 10 mg t.i.d.  7. Hydrocodone 7.5/500 on a p.r.n. basis.  8. Klonopin 2 mg nightly at bedtime.   PERSONAL HISTORY:  The patient is on Medicare.  She is currently  unemployed.  She told apparently lives with her mother and is ambulatory  and unable to take care of herself.   She denies  smoking or use of alcohol.  Her methadone situation has  previously discussed.   Family history is not available in detail, but apparently is positive  for type 2 diabetes.  Her mother has also had a heart attack.  There is  history of stroke and thyroid problems in the family.   REVIEW OF SYSTEMS:  The patient is known to have poor venous circulation  on her lower extremities with no ulceration ever on this basis.  She  apparently has some narrowing of her esophagus presumably secondary to  reflux.  She does have some tendency to diarrhea.  She mentions that she  is a little bit short of breath, but apparently this relates simply to  her obese state.   PHYSICAL EXAMINATION:  VITAL SIGNS:  Blood pressure is 113/71, pulse 92,  respirations 16, temperature 98.3, recent hemoglobin A1c said to be  5.5%.  GENERAL:  The patient is obese, but in no immediate distress.  She is  pleasant, alert, adequately nourished, appears to be in no pain at the  moment.  EXTREMITIES:  Her lower extremities are free of significant edema.  Her  pulses are everywhere palpable and adequate as is her capillary filling.  She has no obvious varicosities.   Her feet are characterized by high arches and with some early clawing of  the toes, interestingly simply with support of the arch and particularly  the anterior transverse arch, the toes straightened out very nicely.  They return essentially as a normal on the right foot.  There is some  slight persistence of hammertoe on the left.  She has slight degree of  hallux rigidus at the MP joint on the right, but she is able to bend the  interphalangeal joint.   On the plantar aspect of the right foot, she has an ulcer underlying the  proximal phalanx of the toe, which measures 0.7 x 0.8 x 0.3 cm and has a  very crusty wound margin with surrounding callus.  She also has a callus  underlying the proximal phalanx of the third toe on that foot.   On the left foot,  she has preulcerative callus underlying the first  metatarsal head.   IMPRESSION:  Diabetes mellitus type 2 with peripheral neuropathy and  diabetic foot ulcer, Wagner stage II.   DISPOSITION:  The patient's wound is debrided, partial thickness,  removing all of the crusts and the skin margins to somewhat saucerized,  but previously was a deeper wound.  The adjacent callus is pared away as  well.  The wound base is reasonably clean and there is some bleeding  which requires touching up with a silver nitrate.   The preulcerative callus is underlying the proximal phalanx of the third  toe on the right and the first metatarsal head on the left are in a  preventive fashion and sharply pared.  This is without incident.   After discussion with the patient, I have recommended total contact  casting to get this wound healed, I am not clear what all the factors  have been at home to keep it traumatically healing over such an extended  period of time, but I think total contact casting would be beneficial.  She will return to this clinic this afternoon at 3 o'clock for this  purpose.  Meanwhile, we have simply posted her flat healing sandal with  a double-thickness felt pad and she will use this until return here in  the afternoon.   It is discussed with the patient that in all likelihood with proper  fitting custom inserts, she can avoid any corrective toe surgery at this  time and that we will help her obtain those once her wound is healed.   As stated above the patient will return later this date for cast  application.  She will be seen in 1 week for cast change and wound  dressing by the nurse and in 2 weeks by the physician.           ______________________________  Jonelle Sports. Cheryll Cockayne, M.D.     RES/MEDQ  D:  12/10/2007  T:  12/11/2007  Job:  [id number]   cc:   Jarome Matin, M.D.

## 2010-08-08 NOTE — Assessment & Plan Note (Signed)
[facility]   NAME:  [name], WILLIS           ACCOUNT NO.:  [id number]   MEDICAL RECORD NO.:  [id number]      DATE OF BIRTH:  1970-09-04   PHYSICIAN:  Barry Dienes. Eloise Harman, M.D.     VISIT DATE:                                   OFFICE VISIT   SUBJECTIVE:  The patient is a 40 year old Caucasian woman who has been  followed for a Wagner grade 2 diabetic neuropathic ulcer on the plantar  aspect of the right great toe.  She has been treated with an E-Z cast  after the area was covered with Puracol AG, hydrogel, and a SofSorb pad.  For some reason, she was not at her scheduled followup last week.  Out  of concern that the cast should not be on greater than 1 week.  She  removed the cast with heavy-duty scissors at home without apparent  injury and has been using a healing sandal since that time.  She has not  had significant pain in her feet or fever or chills.   OBJECTIVE:  VITAL SIGNS:  Blood pressure 119/84, pulse 96, respirations  18, temperature 98.4, most recent capillary blood glucose level was 87  with most recent hemoglobin A1c level of 5.5%.   The condition of the wounds is as follows:  Wound #1:  It is located on the plantar aspect of the right great toe  and measures 0.1 cm x 0.2 cm x 0.1 cm.  There is no significant eschar.  There is minimal necrotic skin overlying the area.  The wound base is  red with no significant drainage.  There is no exposed bone, tendon, or  muscle.  There is callus surrounding the ulcer.  Wound #2:  It lies on the plantar aspect of the left great toe.  There  was a moderate amount of callus in this area, but no ulcer was exposed.  A superficial blood blister was present.   ASSESSMENT:  1. Slowly healing right great toe neuropathic ulcer with noncompliance      with offloading measures.  2. Healed left great toe ulcer   PLAN:  After her feet were washed with soap and water, a #15 scalpel was  used to debride the left great  toe callus and the right great toe callus  and ulcer.  This was done without significant pain or bleeding,  following which Puracol covered by hydrogel was applied to the right  great toe ulcer.  An E-Z cast was then applied.  She did not bring her  shoe for the cast, so she was brought by wheelchair to her automobile.  Despite direct instruction about how to not place weight on the right  foot, she transiently did place partial weight on the base of the cast.  She was advised to continue the healing sandal to the left foot.  She  also was advised to continue applying a moisturizer to the left foot  except between the toes once daily.  She should have a physician  reevaluation in approximately 1 week.  Please note that previously her  cast had broken down in the region of the ankle and at this visit a  fiberglass wrap was used to reinforce this area.  The patient was also  given  written instructions as well on proper care of the cast.           ______________________________  Barry Dienes. Eloise Harman, M.D.     DGP/MEDQ  D:  03/02/2008  T:  03/03/2008  Job:  [id number]

## 2010-08-08 NOTE — Assessment & Plan Note (Signed)
[facility]   NAME:  [name], [name]           ACCOUNT NO.:  [id number]   MEDICAL RECORD NO.:  [id number]      DATE OF BIRTH:  [birth year]   PHYSICIAN:  Jonelle Sports. Sevier, M.D.  VISIT DATE:  06/30/2008                                   OFFICE VISIT   HISTORY:  This 40 year old white female is seen with a diabetic foot  ulcer on her right hallux which was followed here in the latter part of  last year.  When she was most recently seen here on March 11, 2009,  there was to have been appointment back in about 17 days as indicated by  the physician, but she never returned somehow thinking she had been  discharged.  Apparently, the wound had improved at that point and she  was being maintained in the healing sandal with some felt pad posting.  The wound had considerable callus around it at that time as she recalls.   Apparently, within 3-4 weeks of that visit, that is to say mid to late  January 2010, the wound broke down again with an open ulcer began to  drain.  She has persisted in treating this at home with Polysporin and  no particular attention to footwear and has noted that it has shown very  little with any improvement whatsoever.   In the past several days, she reports a low-grade fever and is not aware  of any other apparent source for that.   PHYSICAL EXAMINATION:  Blood pressure is 120/81, pulse 98, respirations  20, temperature 99.4, blood glucose at home this morning 109 mg/dL.   On the plantar aspect of the right hallux near the tuft is an ulcer  measuring 0.3 x 0.3 x 0.2 cm covered by a considerable callus  surrounding it and with a fair amount of slough in its base.  There is  some minimal redness in the area of the hallux, but this does not appear  to be any raging infection.   IMPRESSION:  Persistence or recurrence of diabetic foot ulcer Wagner  stage II, right hallux.   DISPOSITION:  The wound is debrided with removal of all the  surrounding  callus and slough from the wound base.  There does not appear to be  penetration to bone or joint.   The wound is then dressed with an application of Neosporin and dry  dressing and she is returned to her posted healing sandal.   Once the wound was debrided, it was indeed cultured and the patient  pending results of that culture has been started on cephalexin 500 mg  t.i.d., given a total of 15 of these which will run out in approximately  24 hours prior her next visit.   She is to return here in 1 week for further evaluation.   Incidentally, we did discuss the possibility of total contact casting  and I frankly made the opinion that this was what it will take to get  this lady satisfactorily healed.   She also indicated that she had previously been given prescription for  custom inserts, but had never had that filled.           ______________________________  Jonelle Sports Cheryll Cockayne, M.D.     RES/MEDQ  D:  06/30/2008  T:  07/01/2008  Job:  [id number]

## 2010-08-08 NOTE — Assessment & Plan Note (Signed)
[facility]   NAME:  [name], [name]           ACCOUNT NO.:  [id number]   MEDICAL RECORD NO.:  [id number]      DATE OF BIRTH:  13-Feb-1971   PHYSICIAN:  Maxwell Caul, M.D.      VISIT DATE:                                   OFFICE VISIT   [name] is a 40 year old woman who has a Wegener grade 2 diabetic  foot wound on the plantar aspect of her right great toe.  The wound was  initially put in a total contact cast and she seemed to be making  progression for reasons that are not really clear to me is right now the  total contact cast was not continued at some time in October.  As far as  I can tell most recently she has been applying Puracol with gauze and  Kerlix.  She is using an offloading healing shoe.   On examination, she is afebrile.  The wound itself measured 0.3 x 0.5 x  0.2.  I manually removed a large amount of redundant callus from around  the wound.  We did not require disturbance of the wound bed itself or  underlying subcutaneous tissue.  There is no evidence of infection here;  however, the wound dimensions appeared to have increased.   IMPRESSION:  Wegener grade 2 diabetic foot wound, plantar aspect of the  right great toe.   I discussed this in detail with the patient.  This seemed to be making  progress while she was in a cast.  I think there might have been  logistic reasons this was not continued rather than medical one.  The  patient agreed to reapplication of a cast for total protection of the  wound bed.  We applied this today in the clinic.  To the wound bed  itself, we continued with Puracol and hydrogel.  We will see her again  next week.           ______________________________  Maxwell Caul, M.D.     MGR/MEDQ  D:  02/06/2008  T:  02/07/2008  Job:  [id number]

## 2010-08-08 NOTE — Assessment & Plan Note (Signed)
[facility]   NAME:  [name], [name]           ACCOUNT NO.:  [id number]   MEDICAL RECORD NO.:  [id number]      DATE OF BIRTH:  October 05, 1970   PHYSICIAN:  Leonie Man, M.D.    VISIT DATE:  07/20/2008                                   OFFICE VISIT   PROBLEM:  Nonhealing diabetic foot ulcer located on the first toe of the  left foot measurement 0.4 x 0.3 x 0.2 cm.   The patient is a 40 year old female first seen in our clinic in  September 2009, and last seen here in April 2010.  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  This is located at the proximal  interphalangeal joint of the hallux.  The patient has been in contact  cast in the past without significant healing, although she thinks that  contact casting was more helpful.  She currently wears an offloading  shoe, but admits that she does not wear it religiously and frequently  walks around without it.   On examination today, the patient appears nontoxic.  Her temperature is  98.4, respirations 18, blood pressure is 90/64.  Capillary blood glucose  is not recorded.  The ulcer is relatively small and the base of the  ulcer is clean without granulations.  Probing on the ulcer does not go  to tendon or bone.   Treatment today will be Bactroban and a padded dressing after selective  debridement of the toe removing partial-thickness skin and callus from  around the ulcer.  The patient is asked to return in 1 week for  followup.      Leonie Man, M.D.  Electronically Signed     PB/MEDQ  D:  07/20/2008  T:  07/21/2008  Job:  [id number]

## 2010-08-08 NOTE — Assessment & Plan Note (Signed)
[facility]   NAME:  [name], [name]           ACCOUNT NO.:  [id number]   MEDICAL RECORD NO.:  [id number]      DATE OF BIRTH:  12-Aug-1970   PHYSICIAN:  Maxwell Caul, M.D. VISIT DATE:  02/17/2008                                   OFFICE VISIT   [name] is a patient here we are following for a Wagner grade 2  diabetic foot wound on the plantar aspect of her right great toe.  She  was put in an easy cast last week.  She tells me the cast cracked on  Sunday and she took it off.  We have been using Puracol and hydrogel in  the wound under the easy cast.   On examination, the wound itself measured 0.3 x 0.5 x 0.2, this is  unchanged from last week.  I manually removed a large amount of callus  from around the wound.  There is no evidence of active infection.  The  wound bed appears to be reasonably healthy.  She also showed me an area  on her left great toe plantar aspect that was callus.  The callus was  removed and underneath was a small open area which is also a small  Wagner grade 2 wound.   IMPRESSION:  Wagner grade 2 wounds diabetic now bilaterally on the  plantar surface of both great toes.  To the one on the right, we  continued with Puracol AG, hydrogel, a SofSorb pad and a easy cast.  The  one on the left, we simply recommended antibacterial soap wash,  Polysporin, a Kerlix wrap with a healing sandal and a felt offloading  strip.  We will see her back in a week's time.           ______________________________  Maxwell Caul, M.D.     MGR/MEDQ  D:  02/17/2008  T:  02/18/2008  Job:  [id number]

## 2010-08-08 NOTE — Assessment & Plan Note (Signed)
[facility]   NAME:  [name], [name]           ACCOUNT NO.:  [id number]   MEDICAL RECORD NO.:  [id number]      DATE OF BIRTH:  December 20, 1970   PHYSICIAN:  Maxwell Caul, M.D. VISIT DATE:  12/17/2007                                   OFFICE VISIT   Mrs. [name] is a lady we saw for the first time on December 10, 2007.  She is a type 2 diabetic with peripheral neuropathy and diabetic  foot ulcer which is on the plantar aspect of her right first toe.  This  area is reasonably insensate and she is apparently had this for a year.  She was placed in a total contact cast last time with the Bactroban and  gauze over the wound itself.  She had a fair amount of callus debrided  from the periwound area.   PHYSICAL EXAMINATION:  She is afebrile.  The wound dimensions are  roughly 0.8 x 0.4 x 0.1, perhaps somewhat improved.  The base of the  wound appears to be satisfactory.  Mild-to-moderate amount of callus was  removed from around the wound edges.  The base of the wound did not need  to be disturbed.  There was no evidence of infection.   IMPRESSION:  Wegener grade 2 diabetic foot wound as described above.  We  have applied Puracol with silver and hydrogel to the wound, a Kerlix  wrap, and she is placed back in the total contact cast.  She expressed  some reservation of the cast based on the usual difficulties with  lifestyle care issues with her children etc.  I emphasized that she had  failed other mechanism of pressure relief over the last year and she  agreed without much difficulty to go back in the cast.  We will see her  again in 1 week.           ______________________________  Maxwell Caul, M.D.     MGR/MEDQ  D:  12/17/2007  T:  12/18/2007  Job:  [id number]

## 2010-08-08 NOTE — Assessment & Plan Note (Signed)
[facility]   NAME:  [name], [name]           ACCOUNT NO.:  [id number]   MEDICAL RECORD NO.:  [id number]      DATE OF BIRTH:  06-28-1970   PHYSICIAN:  Jonelle Sports. Sevier, M.D.  VISIT DATE:  12/31/2007                                   OFFICE VISIT   HISTORY:  This 40 year old white female is followed for a diabetic foot  ulcer on the plantar aspect of her right hallux at the distal  interphalangeal crease.   This ulcer has been present for about a year but has made dramatic  improvement since she began treatments here several weeks ago.  She has  been in total contact casting with her second cast placed 2 weeks ago.  She was to return last week but did not because of transportation  difficulties.  In the meanwhile, the cast either broke or was cut along  the medial margin at the  __________ area and across the tip.  She  denies any significant odor, any drainage, any bleeding, any fever,  chills, or other systemic symptoms.   EXAMINATION:  VITAL SIGNS:  Blood pressure 110/74, pulse 82,  respirations 18, temperature 98.  The wound itself now measures 0.4 x 0.2 x 0.1 cm and this was after  selective debridement of some surrounding callus and so forth.  The  there is no evidence of deep infection.  There is a small rubbed area on  the medial aspect of that hallux and also rubbed area on the medial  aspect of the heel and posteriorly at the heel.   Likely, these latter were caused by some pistoning of the cast after it  broke.   IMPRESSION:  Diabetic foot ulcer, right hallux with improvement.   DISPOSITION:  1. The wound is debrided as indicated above with removal of      surrounding callus and also a small amount of crust extending over      the distal aspect of the wound margin.  The wound base is      relatively clean.  There area is clean, and the wound is      considerably reduced in its dimensions.  2. The wound is then dressed with an application of  Puracol Ag and      covered with a small Allevyn pad.  3. The rubbed area on the medial aspect of that hallux is also covered      with an Allevyn pad, and the heel was placed in an Allevyn heel      cup.  She will then be returned to total contact cast.   Some discussion was held with the patient about post healing footwear.  She had been recommended for a hammertoe surgery by the podiatrist, but  it seems to me it is a bit early for this as her toes straightened  reasonably well with the support of the transverse metatarsal arch.   She will be seen again here in 1 week, sooner if need be.           ______________________________  Jonelle Sports. Cheryll Cockayne, M.D.     RES/MEDQ  D:  12/31/2007  T:  12/31/2007  Job:  [id number]

## 2010-08-09 NOTE — Assessment & Plan Note (Signed)
REASON FOR VISIT:  Peripheral neuropathy pain.  CHIEF COMPLAINT:  Tiredness, depression, question medications.  A 40 year old female with history of diabetes but very well controlled on oral medications with hemoglobin A1c around 6 over the last 10 years. 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  She has decreased sensation up to the knees and has noted distal to the wrist.  EMG NCV has performed, repeat March 28, 2010, showing absent sural sensory, absent peroneal motor, absent superficial peroneal sensory, absent tibial motor but normal peroneal to the TA.  She had evidence of denervation in right TA, right medial gastroc, left TA, medial gastroc, and bilaterally EDB.  She has had normal B12, normal serum protein electrophoresis.  On serum protein electrophoresis, there was mild elevation beta-1 of 7.9 with upper reference of 7.2 in terms of percentage.  The beta-2 was also mildly elevated at 7.0 with upper limit of 6.5%.  Pain level is about 9/10, but also complaining of somnolence and depression.  Her current pain medicine is oxycodone 10 mg, she takes 5 times a day.  She is wondering whether she can take a higher dose but left often.  Her Topamax dose is 200 at bedtime.  She had dizziness, diarrhea alternating constipation.  PAST MEDICAL HISTORY:  Significant for depression and anxiety.  PHYSICAL EXAMINATION:  VITAL SIGNS:  Blood pressure 135/61, pulse 77, respirations 18, and O2 saturation 98% on room air. GENERAL;  This is a depressed appearing female in no acute distress. She appears tired but is appropriate.  She follows commands without difficulty.  She is oriented. NEUROLOGIC:  Her motor strength is give way bilateral upper and lower extremities.  She does have ankle dorsiflexor weakness to 3- bilaterally.  Her sensation is absent at the great toes bilaterally, reduced sensation at the small toes bilaterally.   She has normal sensation above the knees.  She has normal sensation above the wrist, decreased sensation but is able to distinguish which finger is touched in the hand.  She does have signs of intrinsic atrophy in the feet but not in the hands.  IMPRESSION: 1. Axonal polyneuropathy.  As noted, diabetic control has been very     good less than 6 for the hemoglobin A1c on an ongoing basis.  I am     sending her to [facility] to see if there is any other etiology of     axonal polyneuropathy that be might be explaining the progressive     nature of her illness in light of adequate diabetic control. 2. Continue oxycodone 50 mg q.i.d. 3. Wean off Topamax.  We will go down to 75 b.i.d. for day, 50 b.i.d.     for day, 25 b.i.d. for day, 25 for day, and then discontinue.  I     will see her back in 1 month.     Erick Colace, M.D. Electronically Signed    AEK/MedQ D:  08/08/2010 16:53:37  T:  08/09/2010 01:41:13  Job #:  [id number]  cc:   [facility] Department of Neuromuscular Clinic

## 2010-08-14 ENCOUNTER — Ambulatory Visit (INDEPENDENT_AMBULATORY_CARE_PROVIDER_SITE_OTHER): Payer: Medicaid Other | Admitting: Licensed Clinical Social Worker

## 2010-08-14 DIAGNOSIS — F332 Major depressive disorder, recurrent severe without psychotic features: Secondary | ICD-10-CM

## 2010-08-23 ENCOUNTER — Encounter: Payer: Self-pay | Admitting: Family Medicine

## 2010-08-28 ENCOUNTER — Ambulatory Visit: Payer: Medicaid Other | Attending: Physical Medicine & Rehabilitation | Admitting: Occupational Therapy

## 2010-09-01 ENCOUNTER — Encounter (HOSPITAL_COMMUNITY): Payer: Medicaid Other | Admitting: Licensed Clinical Social Worker

## 2010-09-05 ENCOUNTER — Ambulatory Visit (HOSPITAL_BASED_OUTPATIENT_CLINIC_OR_DEPARTMENT_OTHER): Payer: Medicaid Other | Admitting: Physical Medicine & Rehabilitation

## 2010-09-05 ENCOUNTER — Encounter: Payer: Medicaid Other | Attending: Physical Medicine & Rehabilitation

## 2010-09-05 DIAGNOSIS — R209 Unspecified disturbances of skin sensation: Secondary | ICD-10-CM | POA: Insufficient documentation

## 2010-09-05 DIAGNOSIS — Z79899 Other long term (current) drug therapy: Secondary | ICD-10-CM | POA: Insufficient documentation

## 2010-09-05 DIAGNOSIS — G8929 Other chronic pain: Secondary | ICD-10-CM | POA: Insufficient documentation

## 2010-09-05 DIAGNOSIS — G609 Hereditary and idiopathic neuropathy, unspecified: Secondary | ICD-10-CM

## 2010-09-05 DIAGNOSIS — E119 Type 2 diabetes mellitus without complications: Secondary | ICD-10-CM | POA: Insufficient documentation

## 2010-09-06 NOTE — Assessment & Plan Note (Signed)
REASON FOR VISIT:  Chronic neuropathic pain, bilateral feet.  HISTORY:  A 40 year old female, she has a history of very well controlled diabetes.  Her hemoglobin A1c really has not been above the 6 level, has been managed on metformin, glipizide, but has had progressive neuropathic pain in the lower extremities.  She was seen at [facility] Department of Neurology.  They did some additional blood work on her including HIV testing and had some urine heavy metal testing.  She reports that the physician there was asking her about her circulation.  The patient in the past has been seen in [facility] for diabetic ulcers.  She has had no new medical problems in the interval time period.  Her pain level is 8/10, but she states that overall it is better.  She could walk 10 minutes at a time.  She does not climb steps.  She does not drive.  She has numbness, tremor, tingling, trouble walking, spasms, dizziness, confusion, depression and anxiety on her review of systems.  Blood pressure 138/73, pulse 92, respirations 18 and O2 sat 98% on room air.  General, no acute distress.  Her lower extremities have decreased sensation mainly in the big toe greater than the little toe.  She has decreased pedal and posterior tibial pulses, but good skin temperature. No evidence of skin breakdown at the current time.  She has decreased EHL strength bilaterally and mildly decreased ankle dorsiflexor strength bilaterally.  She does have foot intrinsic atrophy bilaterally.  IMPRESSION: 1. Axonal polyneuropathy question etiology. 2. She is undergoing neurologic evaluation. 3. Claudication-type symptoms.  Pain with increased walking.  Given     her reduced pulses, I would like to get a vascular evaluation, Dr.     Fabienne Bruns over at [facility].  I will see her back in 1 month followup on her vascular studies.  We will continue her oxycodone 15 mg q.i.d.  We will continue Topamax 200 nightly, continue  Klonopin 1 mg nightly.  Discussed with the patient, agrees with plan.  She is to followup at [facility] in 6 months.     Erick Colace, M.D. Electronically Signed    AEK/MedQ D:  09/05/2010 16:09:17  T:  09/06/2010 05:30:38  Job #:  [id number]

## 2010-09-07 ENCOUNTER — Encounter (HOSPITAL_COMMUNITY): Payer: Medicaid Other | Admitting: Licensed Clinical Social Worker

## 2010-09-11 ENCOUNTER — Ambulatory Visit: Payer: Medicaid Other | Admitting: Family Medicine

## 2010-09-14 ENCOUNTER — Telehealth: Payer: Self-pay | Admitting: *Deleted

## 2010-09-14 NOTE — Telephone Encounter (Signed)
Received call on Physicians / Pharmacy voicemail from Dr. Carman Ching office  [facility] ) stating patient  "No Showed " for her appointment yesterday with them. Will forward message to Dr. Hulen Luster.

## 2010-09-15 ENCOUNTER — Encounter (HOSPITAL_COMMUNITY): Payer: Medicaid Other | Admitting: Licensed Clinical Social Worker

## 2010-10-03 ENCOUNTER — Ambulatory Visit (HOSPITAL_BASED_OUTPATIENT_CLINIC_OR_DEPARTMENT_OTHER): Payer: Medicaid Other | Admitting: Physical Medicine & Rehabilitation

## 2010-10-03 ENCOUNTER — Encounter: Payer: Medicaid Other | Attending: Physical Medicine & Rehabilitation

## 2010-10-03 DIAGNOSIS — G609 Hereditary and idiopathic neuropathy, unspecified: Secondary | ICD-10-CM

## 2010-10-03 DIAGNOSIS — G8929 Other chronic pain: Secondary | ICD-10-CM | POA: Insufficient documentation

## 2010-10-03 DIAGNOSIS — Z79899 Other long term (current) drug therapy: Secondary | ICD-10-CM | POA: Insufficient documentation

## 2010-10-03 DIAGNOSIS — E119 Type 2 diabetes mellitus without complications: Secondary | ICD-10-CM | POA: Insufficient documentation

## 2010-10-03 DIAGNOSIS — R209 Unspecified disturbances of skin sensation: Secondary | ICD-10-CM | POA: Insufficient documentation

## 2010-10-03 DIAGNOSIS — L8990 Pressure ulcer of unspecified site, unspecified stage: Secondary | ICD-10-CM

## 2010-10-04 NOTE — Assessment & Plan Note (Signed)
The patient is a 40 year old female with chief complaint of pain in bilateral feet.  She did not bring her pain meds today.  She has been receiving oxycodone 50 mg q.i.d.  [facility] has [facility] to [facility] to get some additional pain or neuropathy evaluation.  I have not received any results on this.  She has complained of sharp, burning, dull, stabbing, tingling, aching pain as constant.  She has had a chronically healing right great toe wound.  She has had no other new medical problems in the interval time, has better relief now that she is on oxycodone 15 q.i.d. per her report.  She took her last pill this morning.  She thinks she maybe 1 day short, I think she is 2-day short.  Other medications include Topamax 200 at bedtime as well as Klonopin one p.o. at bedtime.  Her Klonopin was last filled on July 6.  Her examination has right great toe plantar surface at the base right at the MTP open area nondraining, no erythema, some dried blood on the bandage.  She has decreased sensation to mid leg level bilaterally.  She has some intrinsic atrophy of the feet.  IMPRESSION: 1. Polyneuropathy.  Given her fairly normal hemoglobin A1c, I have     been considering other etiologies besides diabetes.  Has been seen     by [facility].  I do not have any follow up on this.     Her basic lab tests such as sed rate, B12 level, CMET, BMET, and     CBC are all normal. 2. Narcotic analgesic compliance.  May be off by 1 day in terms of her     pain medication.  Check urine drug screen.  Has been compliant thus     far, told to bring in pill for pill count in 3 weeks mid-level     clinic eval.     Erick Colace, M.D. Electronically Signed    AEK/MedQ D:  10/03/2010 16:12:09  T:  10/04/2010 00:39:38  Job #:  [id number]  cc:   [facility] Department of Neurology

## 2010-10-06 NOTE — Telephone Encounter (Signed)
noted 

## 2010-10-10 ENCOUNTER — Encounter (INDEPENDENT_AMBULATORY_CARE_PROVIDER_SITE_OTHER): Payer: Medicaid Other

## 2010-10-10 ENCOUNTER — Encounter (INDEPENDENT_AMBULATORY_CARE_PROVIDER_SITE_OTHER): Payer: Medicaid Other | Admitting: Vascular Surgery

## 2010-10-10 DIAGNOSIS — M79609 Pain in unspecified limb: Secondary | ICD-10-CM

## 2010-10-10 DIAGNOSIS — E1159 Type 2 diabetes mellitus with other circulatory complications: Secondary | ICD-10-CM

## 2010-10-10 DIAGNOSIS — L97509 Non-pressure chronic ulcer of other part of unspecified foot with unspecified severity: Secondary | ICD-10-CM

## 2010-10-11 NOTE — Consult Note (Signed)
NEW PATIENT CONSULTATION  KIRRAH, MUSTIN C DOB:  January 16, 1971                                       10/10/2010 CHART#:[id number]  Patient is a 40 year old female with chronic diabetes mellitus for the past 12 years, which is non-insulin dependent.  She has polyneuropathy, not thought to be due to the diabetes.  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  She was referred for possible vascular insufficiency.  She denies specific claudication symptoms, although she does not ambulate long distances.  She has chronic pain in her legs.  She denies any history of gangrene infection or ulceration other than this one ulcer.  CHRONIC MEDICAL PROBLEMS: 1. Type 1 diabetes type 2. 2. Polyneuropathy. 3. Depression, anxiety. 4. Increased triglycerides. 5. Negative for coronary artery disease, hypertension, COPD, or     stroke.  SOCIAL HISTORY:  She is single, has 1 child, is disabled.  Does not use tobacco or alcohol.  FAMILY HISTORY:  Negative for coronary disease, diabetes and stroke.  REVIEW OF SYSTEMS:  Positive for weight gain, dizziness, leg discomfort as noted, arthritis, joint pain, muscle pain, dyspnea on exertion, orthopnea, depression, anxiety, reflux esophagitis, diarrhea.  All other systems are negative in complete review of systems.  PHYSICAL EXAMINATION:  Blood pressure 126/63, heart rate 93, respirations 14.  General:  She is an obese, middle-aged female in no apparent distress, alert and oriented x3.  HEENT:  Normal for age.  EOMs intact.  Lungs:  Clear to auscultation.  No rhonchi or wheezing. Cardiovascular:  Regular rhythm.  No murmurs.  Carotid pulses are 3+. No audible bruits.  Abdomen:  Obese.  No palpable masses. Musculoskeletal:  Free of major deformities.  Neurologic  exam reveals decreased sensation in both lower extremities below the knees.  Skin exam reveals a punctate ulcer on the plantar surface of the right first toe measuring about 1 cm in diameter with no purulent drainage, erythema, or gangrene noted.  Both legs have 3+ femoral and 2+ popliteal pulses palpable.  Pedal pulses are not palpable but have excellent triphasic flow with the Doppler.  Today I ordered lower extremity arterial Dopplers, which I have reviewed and interpreted.  ABIs are normal in both feet with triphasic flow with ABIs greater than 1.  I reassured her regarding these findings.  She does not have a serious circulatory issue and should be treated for this trophic neuropathic ulcer in the usual fashion that she is having done at the present time.    Quita Skye Hart Rochester, M.D. Electronically Signed  JDL/MEDQ  D:  10/10/2010  T:  10/11/2010  Job:  [id number]  cc:   Erick Colace, M.D.

## 2010-10-24 ENCOUNTER — Encounter: Payer: Medicaid Other | Attending: Neurosurgery | Admitting: Neurosurgery

## 2010-10-24 DIAGNOSIS — G609 Hereditary and idiopathic neuropathy, unspecified: Secondary | ICD-10-CM

## 2010-10-24 DIAGNOSIS — M79609 Pain in unspecified limb: Secondary | ICD-10-CM | POA: Insufficient documentation

## 2010-10-25 NOTE — Assessment & Plan Note (Signed)
Account [id number].  [name] is a patient of Dr. Wynn Banker, seen for pain in her feet bilaterally.  He has questioned before whether it was diabetic neuropathy or some kind of polyneuropathy.  He still has not gotten any response from [facility] work up with Neurology.  She states her pain is not any worse.  It is about 8.  She describes this as a sharp burning to a dull stabbing pain.  It is constant and it is a stocking- glove type sensation.  General activity level is 10.  Pain is worse in the morning and evening.  Sleep patterns are poor.  All activities aggravate.  Medication tends to help.  Mobility:  She is independent. Functionality:  She is on disability.  She needs help with some of her ADLs.  REVIEW OF SYSTEMS:  Notable for the difficulties described above, as well as paresthesias, weakness, trouble with ambulation, spasm, dizziness, depression, anxiety, no suicidal thoughts, fevers, chills, and weight gain.  PAST MEDICAL HISTORY, SOCIAL HISTORY, AND FAMILY HISTORY:  Unchanged.  PHYSICAL EXAMINATION:  VITAL SIGNS:  Her blood pressure is 142/70, pulse 93, respirations 16, O2 sat is 99 on room air.  She does have diminished sensation in her lower extremities. Constitutionally, she is within normal limits.  She is oriented x3.  Her affect is bright and alert.  Her gait is mildly altered.  She does have a short pill count.  She was again warned today that if she is short again, she will be discharged.  The patient is short by at least a day and a half to 2 days, doses at a q.i.d. dosing.  IMPRESSION: 1. Polyneuropathy. 2. Question narcotic analgesic compliance.  PLAN:  We went ahead and refilled her oxycodone 50 mg one p.o. q.i.d. 120 with no refill.  Again, she understands that she is short by 1 pill. She will be discharged.  She will follow up here in 1 month.  Her questions were encouraged and answered.     [name] [name] Electronically  Signed    RLW/MedQ D:  10/24/2010 15:19:23  T:  10/25/2010 02:32:00  Job #:  [id number]

## 2010-11-07 ENCOUNTER — Ambulatory Visit (HOSPITAL_COMMUNITY): Payer: Medicaid Other | Attending: Obstetrics

## 2010-11-16 ENCOUNTER — Encounter (HOSPITAL_COMMUNITY): Payer: Medicaid Other | Admitting: Licensed Clinical Social Worker

## 2010-11-21 ENCOUNTER — Ambulatory Visit (HOSPITAL_BASED_OUTPATIENT_CLINIC_OR_DEPARTMENT_OTHER): Payer: Medicaid Other | Admitting: Physical Medicine & Rehabilitation

## 2010-11-21 ENCOUNTER — Encounter: Payer: Medicaid Other | Attending: Physical Medicine & Rehabilitation

## 2010-11-21 DIAGNOSIS — M62838 Other muscle spasm: Secondary | ICD-10-CM | POA: Insufficient documentation

## 2010-11-21 DIAGNOSIS — F341 Dysthymic disorder: Secondary | ICD-10-CM | POA: Insufficient documentation

## 2010-11-21 DIAGNOSIS — G609 Hereditary and idiopathic neuropathy, unspecified: Secondary | ICD-10-CM

## 2010-11-21 DIAGNOSIS — R209 Unspecified disturbances of skin sensation: Secondary | ICD-10-CM | POA: Insufficient documentation

## 2010-11-21 NOTE — Assessment & Plan Note (Signed)
REASON FOR VISIT:  Pain due to polyneuropathy.  A 40 year old female with polyneuropathy, questioned diabetes versus additional etiology.  No history of familial hereditary neuropathy.  She describes pain is sharp, burning, dull, stabbing, tingling and aching. She does have a neuropathic ulcer in the right great toe.  I did send her for vascular studies in bilateral lower extremities and these were reportedly all normal, which was excellent news.  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  REVIEW OF SYSTEMS:  Numbness, tingling, trouble walking to distance, spasms, depression and anxiety.  PRIMARY PHYSICIAN:  Tana Conch, MD  Blood pressure 131/69, pulse 101, respirations 16 and O2 sat 98% on room air.  The patient unable to distinguish light touch on great toe.  She has decreased proprioception at the great toes well.  She does have some light touch at the heel as well as the little toe area.  She does have intrinsic atrophy at the foot.  She does have good pulses in the foot, warm skin temperature and a neurotrophic ulcer in the plantar surface of the right great toe right at the PIP.  IMPRESSION:  Polyneuropathy thus far looking to be diabetic despite fairly normal hemoglobin A1c over time.  Other potential etiology include hereditary axonal polyneuropathy.  I would need genetic testing to confirm, although this would be unlikely to change any of her management.  PLAN:  Given that she wakes up at night with pain despite taking q.i.d. oxycodone, we will switch her to MS Contin 30 mg t.i.d.  This should be a equal analgesic dose.  Discussed with the patient, agrees with plan.  She is off the Topamax. She will continue the Klonopin 1 p.o. nightly.  She will not take the oxycodone while she is taking the MS Contin.     Erick Colace, M.D. Electronically Signed    AEK/MedQ D:  11/21/2010 16:21:48  T:  11/21/2010 20:48:04   Job #:  [id number]  cc:   Tana Conch, MD

## 2010-11-23 ENCOUNTER — Ambulatory Visit: Payer: Medicaid Other | Admitting: Family Medicine

## 2010-12-04 ENCOUNTER — Encounter: Payer: Self-pay | Admitting: Family Medicine

## 2010-12-04 ENCOUNTER — Ambulatory Visit (INDEPENDENT_AMBULATORY_CARE_PROVIDER_SITE_OTHER): Payer: Medicaid Other | Admitting: Family Medicine

## 2010-12-04 VITALS — BP 132/81 | HR 109 | Ht 71.5 in | Wt 283.0 lb

## 2010-12-04 DIAGNOSIS — F341 Dysthymic disorder: Secondary | ICD-10-CM

## 2010-12-04 DIAGNOSIS — G609 Hereditary and idiopathic neuropathy, unspecified: Secondary | ICD-10-CM

## 2010-12-04 DIAGNOSIS — K589 Irritable bowel syndrome without diarrhea: Secondary | ICD-10-CM

## 2010-12-04 DIAGNOSIS — E1140 Type 2 diabetes mellitus with diabetic neuropathy, unspecified: Secondary | ICD-10-CM | POA: Insufficient documentation

## 2010-12-04 DIAGNOSIS — Z23 Encounter for immunization: Secondary | ICD-10-CM

## 2010-12-04 DIAGNOSIS — E119 Type 2 diabetes mellitus without complications: Secondary | ICD-10-CM

## 2010-12-04 DIAGNOSIS — G629 Polyneuropathy, unspecified: Secondary | ICD-10-CM

## 2010-12-04 DIAGNOSIS — E1149 Type 2 diabetes mellitus with other diabetic neurological complication: Secondary | ICD-10-CM

## 2010-12-04 HISTORY — DX: Polyneuropathy, unspecified: G62.9

## 2010-12-04 LAB — COMPREHENSIVE METABOLIC PANEL
ALT: 21 U/L (ref 0–35)
AST: 21 U/L (ref 0–37)
Albumin: 4.2 g/dL (ref 3.5–5.2)
CO2: 22 mEq/L (ref 19–32)
Calcium: 10 mg/dL (ref 8.4–10.5)
Chloride: 100 mEq/L (ref 96–112)
Creat: 0.57 mg/dL (ref 0.50–1.10)
Potassium: 4.3 mEq/L (ref 3.5–5.3)
Total Protein: 7.7 g/dL (ref 6.0–8.3)

## 2010-12-04 LAB — LIPID PANEL: Triglycerides: 805 mg/dL — ABNORMAL HIGH (ref <150)

## 2010-12-04 MED ORDER — PAROXETINE HCL 40 MG PO TABS: ORAL_TABLET | ORAL | Status: DC

## 2010-12-04 MED ORDER — ENALAPRIL MALEATE 5 MG PO TABS: 5.0000 mg | ORAL_TABLET | Freq: Every day | ORAL | Status: DC

## 2010-12-04 MED ORDER — METFORMIN HCL 1000 MG PO TABS: 1000.0000 mg | ORAL_TABLET | Freq: Two times a day (BID) | ORAL | Status: DC

## 2010-12-04 NOTE — Patient Instructions (Signed)
It was good to see you again today. I would like to see you back in about a month. We can talk about your labs at that time (and print them out for you if you like).   I would like for you to set a goal of walking 5-10 minutes 4-5 days a week with your daughter-or consider getting involved in water aerobics. I want you to continue to speak positively about yourself. Please call dr. Pascal Lux to get an appointment.   We can talk about setting up a yearly physical at your next appointment for a full skin exam.

## 2010-12-04 NOTE — Assessment & Plan Note (Addendum)
Will continue to monitor for changes. Managed by pain clinic. Will not change MS contin at any point in order to not break pain contract. Klonopin 1 mg qhs also prescribed by pain clinic so will not change

## 2010-12-04 NOTE — Assessment & Plan Note (Signed)
Encouraged exercising with water aerobics or walking 5x per week for 10 minutes (extensive pain in legs after this time per patient). Also encouraged to increase fiber intake as has not had bloating.

## 2010-12-04 NOTE — Progress Notes (Signed)
  Subjective:    Patient ID: [name], female    DOB: 01/20/1971, 40 y.o.   MRN: [id number]  HPI [name] is a 19F with a history of type II DM with associated extensive polyneuropathhy, depression, and IBS presenting to establish care.   1. DM-last hgb A1c was 6.3 in 06/2010.  Blood Sugars-ranging from 105-112 on AM checks (only checks 2x/month)  +Neuropathy, Paresthesias (extensive). Pain managed by [facility]. Currently controlled on ms contin.  Negative for Polyuria, Polydipsia, Vision changes  Last eye exam-due in a few weeks (had exam 1 year ago). [facility]. [facility] Hypoglycemia symptoms-none  2. Depression-Patient states she has had long term body issues relating to her father yelling at her for being overweight or claiming that she needed to be a size 0. At baseline, she had some amount of fear in public related to this. When she started getting polyneuropathy related to her DM, she has been in intense pain and has become much more secluded/stays in bed. She says recent pain has been well controlled but despite this she has little motivation to get moving. She says she lays in bed all day except for the time she spends homeschooling her daughter. She says that she "feels like my life is over". 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 Feels that she will never get married. Feels judged by her weight when she goes out in public. Feels any female rejection is related to her weight/failure.  No current plans or intent but does occasionally have SI. Desires to follow up with a psychologist here.   3. Health Maintainene-followed by O[facility]/gyn for pap smears. Had a normal pap last year and has had 3 consecutive normal paps.  -Patient desires a skin check and skin tag removal.     Review of Systems-negative except per hpi      Objective:   Physical Exam  Vitals  reviewed. Constitutional: She is oriented to person, place, and time. She appears well-developed and well-nourished. No distress.  HENT:  Head: Normocephalic and atraumatic.  Neck: Normal range of motion. Neck supple.  Cardiovascular: Normal rate, regular rhythm, normal heart sounds and intact distal pulses.  Exam reveals no gallop.   No murmur heard. Pulmonary/Chest: Effort normal and breath sounds normal. No respiratory distress. She has no wheezes. She has no rales. She exhibits no tenderness.  Abdominal: Soft. Bowel sounds are normal. She exhibits no distension. There is no tenderness. There is no rebound.  Neurological: She is alert and oriented to person, place, and time. She has normal reflexes. A sensory deficit is present.       Fine touch sensation only noted on Bilateral heels ans at 10cm below tibial plateau. Gross touch still intact.   Skin: Skin is warm and dry. She is not diaphoretic.     Psychiatric: She has a normal mood and affect. Her behavior is normal. Judgment and thought content normal.          Assessment & Plan:  Will need skin check at physical exam appointment or could consider at next visit.

## 2010-12-04 NOTE — Assessment & Plan Note (Signed)
Will refill paxil today. EXTENSIVE discussion with patient about her past during this visit. Will give phq9 at next visit. Given card to follow up with Dr. Pascal Lux. Will follow up to see if she has called at next visit.

## 2010-12-04 NOTE — Assessment & Plan Note (Signed)
Repeat hgb a1c shows improvement to 6.0 from 6.3. Will continue to monitor. Neuropathy pain managed by pain clinic. Will continue metformin 1g BID. Also encouraged increased activity level for goal of weight loss and decreased insulin resistance during activity.   Discussed potentially starting an ASA but will allow patient to think about it (consider starting at next visit).  Will also check lipid panel and CMET today.

## 2010-12-06 ENCOUNTER — Telehealth: Payer: Self-pay | Admitting: Family Medicine

## 2010-12-06 ENCOUNTER — Encounter: Payer: Self-pay | Admitting: Family Medicine

## 2010-12-06 DIAGNOSIS — E781 Pure hyperglyceridemia: Secondary | ICD-10-CM

## 2010-12-06 MED ORDER — SIMVASTATIN 20 MG PO TABS: 20.0000 mg | ORAL_TABLET | Freq: Every day | ORAL | Status: DC

## 2010-12-06 NOTE — Telephone Encounter (Signed)
Called patient in regards to recent lipid panel. Called cell x2 and home x 2 with no answer. Left message for patient to call back.   Triglycerides on recent labs were approximately 800 so we will start patient on a statin to help prevent pancreatitis. LDL could no be calculated so we will check a direct LDL at next visit. Ordered simvastatin and sent to pharmacy  . Also, when patient calls back please let her know that she had a recent CBC in May which was within normal limits (she had been requesting this lab). Please let her know that her A1c had improved to 6.0 as well. We will discuss all of her labs at her next visit.

## 2010-12-06 NOTE — Telephone Encounter (Signed)
pls call pt after 2:30 on her cell phone.

## 2010-12-06 NOTE — Telephone Encounter (Signed)
Pls call  [phone]

## 2010-12-19 LAB — CBC
Platelets: 275
RDW: 14.3
WBC: 8

## 2010-12-19 LAB — HEMOGLOBIN A1C
Hgb A1c MFr Bld: 5.5
Mean Plasma Glucose: 119

## 2010-12-19 LAB — SEDIMENTATION RATE: Sed Rate: 18

## 2010-12-25 ENCOUNTER — Encounter: Payer: Medicaid Other | Attending: Physical Medicine & Rehabilitation

## 2010-12-25 ENCOUNTER — Ambulatory Visit (HOSPITAL_BASED_OUTPATIENT_CLINIC_OR_DEPARTMENT_OTHER): Payer: Medicaid Other | Admitting: Physical Medicine & Rehabilitation

## 2010-12-25 DIAGNOSIS — G609 Hereditary and idiopathic neuropathy, unspecified: Secondary | ICD-10-CM

## 2010-12-25 DIAGNOSIS — Z79899 Other long term (current) drug therapy: Secondary | ICD-10-CM | POA: Insufficient documentation

## 2010-12-25 DIAGNOSIS — G8929 Other chronic pain: Secondary | ICD-10-CM | POA: Insufficient documentation

## 2010-12-25 DIAGNOSIS — E119 Type 2 diabetes mellitus without complications: Secondary | ICD-10-CM | POA: Insufficient documentation

## 2010-12-25 DIAGNOSIS — R209 Unspecified disturbances of skin sensation: Secondary | ICD-10-CM | POA: Insufficient documentation

## 2010-12-25 NOTE — Assessment & Plan Note (Signed)
REASON FOR VISIT:  Bilateral foot pain.  HISTORY:  A 40 year old female with severe diabetic neuropathy.  She has had foot ulcer, right great toe which is chronic.  She has had EMG documentation.  She has had workup for other types of neuropathy which have been negative.  She has had vascular studies normal.  OTHER PAST HISTORY:  Obstructive uropathy due to ureteral stone.  INTERVAL HISTORY:  She has been trialed on MS Contin 30 mg t.i.d.  She had no difficulty in her transition from one medication to another.  She continues to have poor sleep on Klonopin 1 mg at bedtime.  She ran out a couple of days ago, had some extra problems with falling asleep.  Her average pain is 8.  Her pain interferes with ambulation 5-minute walking tolerance.  SOCIAL HISTORY:  She did a trip to dorm for a concert with her daughter which is the first-time she has gotten out for a long time, I congratulated her on this.  REVIEW OF SYSTEMS:  Positive for depression, anxiety, trouble walking, spasms, dizziness, numbness, tingling.  Other physicians involved in care include [facility] as well as psychologist through that practice.  PHYSICAL EXAMINATION:  VITAL SIGNS:  Her blood pressure 116/72, pulse 89, respirations 16, O2 sat 97% on room air. GENERAL:  No acute distress.  Mood and affect appropriate. MUSCULOSKELETAL:  Her feet have decreased sensation below the ankle to light touch as well as pin.  She has stocking distribution pattern.  She has good strength in the lower extremities with the exception of decreased toe flexion/extension as well as 4+ in ankle dorsiflexors. She has absent ankle jerks, but normal knee jerks bilaterally.  IMPRESSION: 1. Polyneuropathy due to diabetes. 2. Diabetic pressure ulcers, seeing Wound Care. 3. Chronic pain related to above.  PLAN:  I will start some Elavil 10 mg at bedtime in addition to her MS Contin 30 t.i.d. in addition to Klonopin 1  at bedtime.  I will see her back in 1 month, may need to go up to 25 of Elavil at that time.     Erick Colace, M.D. Electronically Signed    AEK/MedQ D:  12/25/2010 16:16:44  T:  12/25/2010 23:12:20  Job #:  [id number]

## 2010-12-26 ENCOUNTER — Ambulatory Visit: Payer: Medicaid Other | Admitting: Physical Medicine & Rehabilitation

## 2010-12-28 ENCOUNTER — Ambulatory Visit: Payer: Medicaid Other | Admitting: Physical Medicine & Rehabilitation

## 2011-01-01 ENCOUNTER — Other Ambulatory Visit: Payer: Self-pay | Admitting: Family Medicine

## 2011-01-19 ENCOUNTER — Ambulatory Visit (HOSPITAL_BASED_OUTPATIENT_CLINIC_OR_DEPARTMENT_OTHER): Payer: Medicaid Other | Admitting: Physical Medicine & Rehabilitation

## 2011-01-19 DIAGNOSIS — G609 Hereditary and idiopathic neuropathy, unspecified: Secondary | ICD-10-CM

## 2011-01-22 ENCOUNTER — Telehealth: Payer: Self-pay | Admitting: Psychology

## 2011-01-22 NOTE — Telephone Encounter (Signed)
Patient left VM to schedule [facility] appt.  Reviewed Dr. Erasmo Leventhal note.  Called patient and left VM.

## 2011-01-22 NOTE — Assessment & Plan Note (Signed)
REASON FOR VISIT:  Bilateral foot pain and numbness.  HISTORY:  A 40 year old female with polyneuropathy in lower extremities most likely etiology is diabetes, however, her diabetes has been well controlled for many years.  She has had a fall about 2 months ago but nothing since I last saw her on November 21, 2010.  She has had trials of narcotic analgesics such as methadone in the past at another clinic, oxycodone at this clinic and now was on MS Contin.  She is doing relatively well on this medication. She is off her Topamax.  We trialed her on Elavil 10 mg, she has not had any beneficial effects from this.  She has been given a prescription in the past for nortriptyline when she went to [facility], did not take the medication.  She continues on Klonopin 1 p.o. at bedtime.  She has had no new medical problems.  Her other medications include Paxil and enalapril as well as simvastatin.  She feels her pain symptoms are progressing with time.  PHYSICAL EXAMINATION:  VITAL SIGNS:  Her blood pressure is 131/73, pulse 96, respirations 16, and O2 saturation 97% on room air. GENERAL:  No acute distress.  Mood and affect appropriate.  Her sensation to vibratory sensory is absent in the toes, present at the ankles, sharp touch is absent in the foot, toe, and ankle, but present at mid calf level.  She has 3/5 ankle dorsiflexor strength bilaterally but when she ambulates she has no evidence of foot slap.  IMPRESSION:  Polyneuropathy primarily affecting the lower extremities with decreased vibratory as well as sharp touch as well as some mild to moderate motor loss.  PLAN: 1. We will continue on MS Contin. 2. Change from amitriptyline to nortriptyline and put her up at a 25     mg dose. 3. No need for AFOs just yet but will monitor for this.  I examined     her shoes, no evidence of abnormal toe wear.  She is getting some     diabetic shoes as well.  I discussed with the patient,  agrees with     plan.  She also has a right great toe ulcer which is not open     mainly callus at this point, she may benefit from debridement from     Podiatry.  She states she has done this for her in the past, she     will call them. 4. I also discussed exercise and now that her toe wound is closed.  I     think she can safely go in a pool, she will try some pool therapy     at the [facility].     Erick Colace, M.D. Electronically Signed    AEK/MedQ D:  01/19/2011 12:38:33  T:  01/19/2011 13:26:35  Job #:  [id number]

## 2011-02-07 ENCOUNTER — Encounter: Payer: Self-pay | Admitting: Family Medicine

## 2011-02-07 ENCOUNTER — Ambulatory Visit (INDEPENDENT_AMBULATORY_CARE_PROVIDER_SITE_OTHER): Payer: Medicaid Other | Admitting: Family Medicine

## 2011-02-07 VITALS — BP 133/84 | HR 121 | Temp 98.6°F | Ht 71.5 in | Wt 279.0 lb

## 2011-02-07 DIAGNOSIS — Z23 Encounter for immunization: Secondary | ICD-10-CM

## 2011-02-07 DIAGNOSIS — G609 Hereditary and idiopathic neuropathy, unspecified: Secondary | ICD-10-CM

## 2011-02-07 DIAGNOSIS — E1149 Type 2 diabetes mellitus with other diabetic neurological complication: Secondary | ICD-10-CM

## 2011-02-07 DIAGNOSIS — E669 Obesity, unspecified: Secondary | ICD-10-CM

## 2011-02-07 DIAGNOSIS — F341 Dysthymic disorder: Secondary | ICD-10-CM

## 2011-02-07 DIAGNOSIS — E119 Type 2 diabetes mellitus without complications: Secondary | ICD-10-CM

## 2011-02-07 DIAGNOSIS — IMO0001 Reserved for inherently not codable concepts without codable children: Secondary | ICD-10-CM

## 2011-02-07 DIAGNOSIS — G629 Polyneuropathy, unspecified: Secondary | ICD-10-CM

## 2011-02-07 DIAGNOSIS — K589 Irritable bowel syndrome without diarrhea: Secondary | ICD-10-CM

## 2011-02-07 DIAGNOSIS — E781 Pure hyperglyceridemia: Secondary | ICD-10-CM

## 2011-02-07 MED ORDER — PRAVASTATIN SODIUM 40 MG PO TABS: 40.0000 mg | ORAL_TABLET | Freq: Every evening | ORAL | Status: DC

## 2011-02-07 MED ORDER — ENALAPRIL MALEATE 5 MG PO TABS: 20.0000 mg | ORAL_TABLET | Freq: Every day | ORAL | Status: DC

## 2011-02-07 NOTE — Patient Instructions (Addendum)
Good to see you again, [name]! To review:  1. We are going to place a referral for physical therapy for you for gait and balance training  -in the mean time we gave you an prescription  for wheelchair and cane for temporary use.  2. You got a flu shot today.  3. Sent in a prescription for a new medication for your high triglycerides 4. Refilled enalapril at 20mg  5. Please come back in 3 months for next diabetic exam 6. Please get your diabetic shoes as prescribed by pain clinic.  7. Please start exercising at the [facility] with water aerobics.  8. Please call Dr. Pascal Lux back for an appointment.   Happy Holidays! Dr. Durene Cal

## 2011-02-10 DIAGNOSIS — IMO0001 Reserved for inherently not codable concepts without codable children: Secondary | ICD-10-CM | POA: Insufficient documentation

## 2011-02-10 DIAGNOSIS — E669 Obesity, unspecified: Secondary | ICD-10-CM | POA: Insufficient documentation

## 2011-02-10 NOTE — Assessment & Plan Note (Addendum)
Patient previously on enalapril 20 and had reported 5 at last visit. Increased to 20mg . Will need BMET on next visit.   Ulcer stable on right first toe. Patient already has referral for diabetic shoes. Encouraged patient to fill this rx.

## 2011-02-10 NOTE — Assessment & Plan Note (Addendum)
Did not tolerate simvastatin. Willing to try pravastatin. Recheck triglycerides at next visit.

## 2011-02-10 NOTE — Assessment & Plan Note (Signed)
Managed by pain clinic. Gave hand written rx for cane and rolling walker. Will refer to pt for gait and balance training.

## 2011-02-10 NOTE — Assessment & Plan Note (Addendum)
Encouraged exercise. Already on SSRI. Will try fiber as no bloating (did not try last time). Wants GI visit but will hold off until maximizing therapy with PCP.   Patient described diarrhea 4-5x per day although on last visit said once per day. Will follow.

## 2011-02-10 NOTE — Assessment & Plan Note (Signed)
Only eats 1 meal per day. Encouraged more frequent meals and increased activity. Encouraged patient to go through with plans at the [facility] for water aerobics.

## 2011-02-10 NOTE — Progress Notes (Signed)
Subjective:    Patient ID: [name], female    DOB: 1970/09/24, 40 y.o.   MRN: [id number]  HPI  [name] is a 40F with a history of type II DM with associated extensive polyneuropathhy, depression, and IBS presenting for a yearly physical but with a number of acute complaints which precludes CPE from being able to be performed today.   1. DM - neuropathy-followed by pain clinic. Patient on MS contin 30mg  TID. She says even with this regimen she has pain with every step. If she walks 10-15 minutes in one day, she says the pain is so severe that she cannot get out of bed . She says because of the pain, she has difficulty walking up the steps or enjoying things like going to a festival (although she rarely gets out of the house due to depression). Patient would like a cane that would help her at her tall stature. Also wants a rolling walker to be able to get around at festivals/shopping etc. She says the pain makes her unsteady on her feet.  -recent eye exam unremarkable.   2. Obesity-certainly contributes to patient's decreased activity level. She plans to try to exercise by starting at the [facility] and doing water aerobics. Didn't walk as previously planned due to pain.   3. Chest pain-patient has had 3/10 twinges in her sternum lasting less than 10 seconds approximately once an hour for several hours after eating last night. Says she thinks she imagined some left arm numbness because she was worried about the pain. She says once she calmed down, numbness and twinges resolved. No nausea, vomiting, shortness of breath, palpitations.   4. IBS-patient complains of several bouts of diarrhea per day. No bloating. She did not start taking fiber as previously recommended. She also has not exercised as recommended due to her pain. She wants to see a GI doctor.    5. Depression-plans to see Dr. Pascal Guerra. Was given referral at last appointment. Patient appears much more focused on polyneuropathy as cause of  her not getting out of the house, but previously mentioned how her depression/fears of people judging her for her weight kept her from leaving her home. She expresses on several occassions her desired to be married again, but still worried that these relationships would fail.   6. Health Maintainene-wants flu shot today  7. Hypertriglyceridemia-patient did not tolerate simvastatin due to leg cramping. . Willing to try Pravastatin      Review of Systems -negative except per hpi      Objective:   Physical Exam  Vitals reviewed. Constitutional: She is oriented to person, place, and time. She appears well-developed and well-nourished. No distress.  HENT:  Head: Normocephalic and atraumatic.  Neck: Normal range of motion. Neck supple.  Cardiovascular: Normal rate, regular rhythm, normal heart sounds and intact distal pulses.  Exam reveals no gallop.   No murmur heard. Pulmonary/Chest: Effort normal and breath sounds normal. No respiratory distress. She has no wheezes. She has no rales. She exhibits no tenderness.  Abdominal: Soft. Bowel sounds are normal. She exhibits no distension. There is no tenderness. There is no rebound.  Neurological: She is alert and oriented to person, place, and time. She has normal reflexes. A sensory deficit is present.       Fine touch sensation only noted on Bilateral heels ans at 10cm below tibial plateau. Gross touch still intact.   Observed patient walk and appeared to be stable on her feet without appearance of antalgic  gait.   5/5 muscle strength in lower extremities. Thin legs with appearance of some muscle wasting  in comparison to arms and central adiposity.   Skin: Skin is warm and dry. She is not diaphoretic.     Psychiatric: She has a normal mood and affect. Her behavior is normal. Judgment and thought content normal.   BP 133/84  Pulse 121  Temp(Src) 98.6 F (37 C) (Oral)  Ht 5' 11.5" (1.816 m)  Wt 279 lb (126.554 kg)  BMI 38.37 kg/m2   LMP 01/30/2011        Assessment & Plan:  Wants to have a skin exam with removal of skin tags in her inguinal area.   Atypical Chest pain-patient admits to strong anxiety component. Reviewed red flags with patient. Resolved on it's own. Not an ongoing issue.

## 2011-02-13 ENCOUNTER — Telehealth: Payer: Self-pay | Admitting: Psychology

## 2011-02-13 ENCOUNTER — Encounter: Payer: Self-pay | Admitting: Family Medicine

## 2011-02-13 NOTE — Telephone Encounter (Signed)
Patient left a VM requesting appt.  Asked that I find one in the afternoon and leave it on her VM as she is usually awake in the middle of the night and not in the day when I call.  First afternoon appt was December 3rd at 3:00.  Left this on her VM and asked her to call to confirm.

## 2011-02-19 ENCOUNTER — Encounter: Payer: Medicaid Other | Attending: Physical Medicine & Rehabilitation

## 2011-02-19 ENCOUNTER — Encounter (HOSPITAL_BASED_OUTPATIENT_CLINIC_OR_DEPARTMENT_OTHER): Payer: Medicaid Other | Attending: Internal Medicine

## 2011-02-19 ENCOUNTER — Ambulatory Visit (HOSPITAL_BASED_OUTPATIENT_CLINIC_OR_DEPARTMENT_OTHER): Payer: Medicaid Other | Admitting: Physical Medicine & Rehabilitation

## 2011-02-19 DIAGNOSIS — E1169 Type 2 diabetes mellitus with other specified complication: Secondary | ICD-10-CM | POA: Insufficient documentation

## 2011-02-19 DIAGNOSIS — G609 Hereditary and idiopathic neuropathy, unspecified: Secondary | ICD-10-CM

## 2011-02-19 DIAGNOSIS — E669 Obesity, unspecified: Secondary | ICD-10-CM | POA: Insufficient documentation

## 2011-02-19 DIAGNOSIS — Z79899 Other long term (current) drug therapy: Secondary | ICD-10-CM | POA: Insufficient documentation

## 2011-02-19 DIAGNOSIS — G8929 Other chronic pain: Secondary | ICD-10-CM | POA: Insufficient documentation

## 2011-02-19 DIAGNOSIS — R209 Unspecified disturbances of skin sensation: Secondary | ICD-10-CM | POA: Insufficient documentation

## 2011-02-19 DIAGNOSIS — L97509 Non-pressure chronic ulcer of other part of unspecified foot with unspecified severity: Secondary | ICD-10-CM | POA: Insufficient documentation

## 2011-02-19 DIAGNOSIS — E119 Type 2 diabetes mellitus without complications: Secondary | ICD-10-CM | POA: Insufficient documentation

## 2011-02-19 DIAGNOSIS — G894 Chronic pain syndrome: Secondary | ICD-10-CM

## 2011-02-19 NOTE — Progress Notes (Unsigned)
[facility]  NAME:  [name], [name]           ACCOUNT NO.:  [id number]  MEDICAL RECORD NO.:  [id number]      DATE OF BIRTH:  07-08-1970  PHYSICIAN:  Jonelle Sports. Kenesha Moshier, M.D.  VISIT DATE:  02/19/2011                                  OFFICE VISIT   HISTORY:  This 40 year old white female with obesity and type 2 diabetes has previously been seen in this clinic for diabetic foot ulcers in association with peripheral neuropathy, her last visit having been here in June 2011.  At that time, she had an infectious ulcer on the hallux of the right foot, and there may have been some question about osteomyelitis.  Hyperbaric oxygen therapy was recommended but never undertaken.  She apparently gradually healed with more conservative measures and was last seen here as mentioned some 18 months ago.  The patient says that during that time the wound never completely healed "on the surface." As a result, she has tended to keep it bandaged and only did check it every several days.  When she undressed the wound 2 nights ago, she noticed blood on the sock and also on the bandage and was surprised to find a major open wound on the plantar aspect of that toe.  She is totally unaware of any trauma and specifically denies excessive walking or anything of that nature. Fortunately, she has had no fever or systemic symptoms or anything else to suggest systemic involvement at this point.  She is here now for our evaluation and advice.  Past medical history, personal history, social history really are essentially unchanged since her last visit here.  She has had no additional surgeries or hospitalizations.  MEDICATIONS:  Remain the same which is to say metformin (she was previously on Glynase also but that has been stopped), Paxil, allopurinol, MS Contin and Klonopin for chronic pain syndrome.  ALLERGIES:  She is allergic to VANCOMYCIN and CODEINE.  The patient reports that her  diabetes is in good control and in fact that she has been recently reduced to metformin alone and has had an A1c of 6.0 within the past 6 weeks.  She denies any recent advances in her neuropathy and says that she has no complications such as nephropathy or retinopathy.  Interestingly, she is on disability.Marland Kitchen  EXAMINATION:  VITAL SIGNS:  Blood pressure is 135/84, pulse is 105 and regular, respirations are 18,  random blood glucose 103 mg/dL. GENERAL:  She is an obese but otherwise healthy-appearing early middle- aged white female in no immediate distress.  General exam is not undertaken. EXTREMITIES:  No edema.  All pulses are palpable.  She does not have a striking neuropathic appearance but does lack protective sensation in her feet.  She has some slight corn in the toes.  The right hallux is slightly rigid.  On the plantar aspect of the proximal phalanx in the right hallux is a large, deep full-thickness skin tear which appears perhaps to have resulted from some kind of shear forces and possible blister formation.  It appears relatively clean, there is no significant drainage.  The toe itself is generally and slightly reddened but there is no warmth.  IMPRESSION:  Diabetic foot ulcer Wagner stage II, right hallux, likely traumatically induced.  DISPOSITION:  The wound is debrided of  this nonviable full-thickness skin flap and the wound margins are cleaned up as well.  The wound base appears reasonably clean and healthy.  Following debridement, the wound is treated with an application of hydrogel and silver collagen and this in turn is covered with a bit of silver alginate.  She is placed in a bulky wrap in a toe sock type and into a healing sandal on that foot.  She will need either an x-ray or MRI for investigation of the question possible osteomyelitis but because she is on Medicaid, we will begin as they require with a routine x-ray.  Meanwhile, we have done a C and S of the  wound following debridement.  Followup visit will be here in 1 week.  ADDENDUM:  The patient was seen for new consultation by Dr. Jerilee Field of the peripheral vascular surgeons on October 10, 2010, at which time, she was found to have ABIs greater than 1 and normal waveforms on his examination suggesting him to conclude there was no significant underlying vascular basis for her problems.          ______________________________ Jonelle Sports Cheryll Cockayne, M.D.     RES/MEDQ  D:  02/19/2011  T:  02/19/2011  Job:  [id number]

## 2011-02-19 NOTE — Assessment & Plan Note (Signed)
This is a patient of Dr. Wynn Banker, with polyneuropathy in the lower extremities due to diabetes.  She has had some wounds on her foot.  She is followed at [facility] for a left foot wound today.  She reports no change in her pain, about 3-4.  General activity level is 10. Pain is worse in the evening and night.  Pain is worse with walking, bending, standing, and activity.  Medication helps.  She can use a cane for ambulation.  She is on disability.  Needs help with ADLs and household duties.  REVIEW OF SYSTEMS:  Notable for difficulties described above as well as some GI issues, depression, anxiety.  No suicidal thoughts or aberrant behaviors.  Last pill count on UDS consistent.  Past medical history, social history, family history unchanged.  PHYSICAL EXAMINATION:  VITAL SIGNS:  Blood pressure 143/80, pulse 115, respirations 18, O2 sats 99 on room air. MUSCULOSKELETAL:  Motor strength and sensation are intact in the upper extremities, diminished in lower extremities with motor strength, plantar flexion are not examined due to the wound on her foot.  ASSESSMENT:  Polyneuropathy with lower extremities, diminished sensation, and wounds.  PLAN:  Refill MS Contin CR 30 mg 1 p.o. t.i.d., 90 with no refill.  We are going to try oxycodone 10 mg once daily for breakthrough pain, 30 with no refill.  Her questions were encouraged and answered.  Dr. Wynn Banker will see her in a month.     [name] [name] Electronically Signed    RLW/MedQ D:  02/19/2011 15:54:31  T:  02/19/2011 23:34:37  Job #:  [id number]

## 2011-02-26 ENCOUNTER — Ambulatory Visit (INDEPENDENT_AMBULATORY_CARE_PROVIDER_SITE_OTHER): Payer: Medicaid Other | Admitting: Psychology

## 2011-02-26 ENCOUNTER — Encounter (HOSPITAL_BASED_OUTPATIENT_CLINIC_OR_DEPARTMENT_OTHER): Payer: Medicaid Other | Attending: Internal Medicine

## 2011-02-26 DIAGNOSIS — L97509 Non-pressure chronic ulcer of other part of unspecified foot with unspecified severity: Secondary | ICD-10-CM | POA: Insufficient documentation

## 2011-02-26 DIAGNOSIS — Z79899 Other long term (current) drug therapy: Secondary | ICD-10-CM | POA: Insufficient documentation

## 2011-02-26 DIAGNOSIS — E1169 Type 2 diabetes mellitus with other specified complication: Secondary | ICD-10-CM | POA: Insufficient documentation

## 2011-02-26 DIAGNOSIS — F341 Dysthymic disorder: Secondary | ICD-10-CM

## 2011-02-26 DIAGNOSIS — G609 Hereditary and idiopathic neuropathy, unspecified: Secondary | ICD-10-CM | POA: Insufficient documentation

## 2011-02-26 NOTE — Patient Instructions (Signed)
Please schedule a follow-up for 12/10 at 2:00. Please consider one or two specific behaviors you would like to change.   On a scale of 1-10, rate how important it is for you to change this behavior.  What makes it meaningful. Same scale, rate how confident you are that you can change this behavior.
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
## 2011-02-27 NOTE — Assessment & Plan Note (Addendum)
Irena is dressed in sweat pants and a loose fitting shirt.  She has a shoe cast on.  Her hair is somewhat disheveled.  She maintains good eye contact and is cooperative and attentive.  Her speech is normal in tone, rate and rhythm.  She did not report on her mood.  Affect was within normal limits.  Thought process is logical and goal directed.  Denied suicidal ideation.  Does not appear to be responding to any internal stimuli.  Able to maintain train of thought and concentrate on the questions.  Judgment and insight are average.  Goal of therapy is to "make me happy if I can and make Lillia Abed happy."  She feels as though she is failing her daughter but attempts to change this have not been successful.  She recognizes that the lack of structure in their daily lives is problematic.  They stay up late and sleep most of the day.  They eat maybe one meal per day and "everything is done in [her] bed" from eating, sleeping to socializing.  Lillia Abed sleeps in bed with her even though she has a separate room.    Agreed to schedule a follow-up appointment for December 10th at 2:00.  See patient instructions for further plan.

## 2011-03-05 ENCOUNTER — Ambulatory Visit: Payer: Medicaid Other | Admitting: Psychology

## 2011-03-14 ENCOUNTER — Ambulatory Visit (INDEPENDENT_AMBULATORY_CARE_PROVIDER_SITE_OTHER): Payer: Medicaid Other | Admitting: Psychology

## 2011-03-14 DIAGNOSIS — F341 Dysthymic disorder: Secondary | ICD-10-CM

## 2011-03-14 NOTE — Progress Notes (Signed)
[name] was about 15 minutes late for [name] appointment.  She had done [name] homework and identified two behaviors she thinks are important.  Sleep schedule is a 10 but she is only at a 4 in terms of confidence she can do it.  The other behavior was developing a study schedule for home-school for [name] daughter.  Currently they are doing nothing.  The plan is for [name] to eventually get [name] GED.  [name] reported some hurtful comments [name] brother made a few months ago and the difficulty she will have interacting with him over the holidays.  Talked a lot about behaviors listed above and what she wants to do about them.

## 2011-03-14 NOTE — Patient Instructions (Signed)
Please schedule a follow-up for:  January 10th at 2:00. You named your top two behaviors that you want to change:  Sleep schedule and study habits for [name].  We focused on the first today. Creating a boundary around your bed seems like a great first step to managing your sleep schedule.  Setting a typical bed time and wake time would also be useful.  Structure around nap times (with alarm set) would be good as well.  I know you have many barriers (pain, medication) to better sleep.  I get that.  You can make changes that should lead to improvements.  Bed is for sleeping.  Sleeping there alone is best.  Consider a family meeting to discuss. The other thing that might be helpful in the short-term is increasing your outside the house activities.  You came up with the following activities: 1) [name] 2) [name] 3) [name]

## 2011-03-14 NOTE — Assessment & Plan Note (Signed)
Report of mood is good today.  Pain is better today too and the the two go together.  She gets off track easily.  Also, is a little defensive around discussing boundaries with her daughter.  Highlighted and validated that as a common reaction to looking at parenting strategies while also providing some reality checks along the way.  She brought up at the end about wanting to meet up with a man as an activity.  Did not have time to address.  Additionally, she had questions about sleep hygeine.  Provided handout.  See patient instructions for further plan.

## 2011-03-15 ENCOUNTER — Encounter: Payer: Medicaid Other | Attending: Physical Medicine & Rehabilitation

## 2011-03-15 ENCOUNTER — Ambulatory Visit (HOSPITAL_BASED_OUTPATIENT_CLINIC_OR_DEPARTMENT_OTHER): Payer: Medicaid Other | Admitting: Physical Medicine & Rehabilitation

## 2011-03-15 DIAGNOSIS — Z79899 Other long term (current) drug therapy: Secondary | ICD-10-CM | POA: Insufficient documentation

## 2011-03-15 DIAGNOSIS — G609 Hereditary and idiopathic neuropathy, unspecified: Secondary | ICD-10-CM

## 2011-03-15 DIAGNOSIS — R209 Unspecified disturbances of skin sensation: Secondary | ICD-10-CM | POA: Insufficient documentation

## 2011-03-15 DIAGNOSIS — G8929 Other chronic pain: Secondary | ICD-10-CM | POA: Insufficient documentation

## 2011-03-15 DIAGNOSIS — E119 Type 2 diabetes mellitus without complications: Secondary | ICD-10-CM | POA: Insufficient documentation

## 2011-03-15 DIAGNOSIS — G894 Chronic pain syndrome: Secondary | ICD-10-CM

## 2011-03-16 NOTE — Assessment & Plan Note (Signed)
This patient of Dr. Wynn Banker on his schedule day he asked me to see due to time constraints.  The patient reports good relief with the oxycodone I started her on last month.  She rates her average pain is a 2.  It is a sharp to dull, stabbing, tingling and aching pain.  General activity level is 10.  Pain is worse in the evening and night.  Sleep patterns are fair.  Walking, sitting, standing and activity aggravate; medication helps.  She walks without assistance.  She does not climb steps.  She does drive functionally.  She is on disability.  She needs help with household duties and shopping.  REVIEW OF SYSTEMS:  Notable for the difficulties described above, otherwise unremarkable.  Last UDS and pill count is correct.  PAST MEDICAL HISTORY, SOCIAL HISTORY AND FAMILY HISTORY:  Unchanged.  PHYSICAL EXAMINATION:  VITAL SIGNS:  Her blood pressure is 123/57, pulse 80, respirations 20 and O2 sats 98 on room air.  EXTREMITIES:  Motor strength and sensation are intact. NEUROLOGIC:  Constitutionally, she is obese.  She is alert and oriented x3.  She has a normal gait except for podiatry issues she has on her right foot.  ASSESSMENT:  Polyneuropathy basically in the lower extremities.  PLAN: 1. Refill Klonopin 1 mg 1 p.o. at bedtime 30 with no refill. 2. OxyContin CR 30 mg 1 p.o. t.i.d. 90 with no refill 3. Oxycodone 10 mg 1 p.o. daily 30 with no refills.  Her questions     were encouraged and answered.  We will see her in a month.     [name] [name] Electronically Signed    RLW/MedQ D:  03/15/2011 13:44:27  T:  03/16/2011 08:47:23  Job #:  [id number]

## 2011-03-19 ENCOUNTER — Encounter (HOSPITAL_BASED_OUTPATIENT_CLINIC_OR_DEPARTMENT_OTHER): Payer: Medicaid Other

## 2011-04-05 ENCOUNTER — Ambulatory Visit: Payer: Medicaid Other | Admitting: Psychology

## 2011-04-09 ENCOUNTER — Encounter (HOSPITAL_BASED_OUTPATIENT_CLINIC_OR_DEPARTMENT_OTHER): Payer: Medicaid Other | Attending: Internal Medicine

## 2011-04-09 ENCOUNTER — Ambulatory Visit (HOSPITAL_COMMUNITY)
Admission: RE | Admit: 2011-04-09 | Discharge: 2011-04-09 | Disposition: A | Discharge status: Home / Self Care | Payer: Medicaid Other | Source: Ambulatory Visit | Attending: Internal Medicine | Admitting: Internal Medicine

## 2011-04-09 ENCOUNTER — Other Ambulatory Visit (HOSPITAL_BASED_OUTPATIENT_CLINIC_OR_DEPARTMENT_OTHER): Payer: Self-pay | Admitting: Internal Medicine

## 2011-04-09 DIAGNOSIS — E1169 Type 2 diabetes mellitus with other specified complication: Secondary | ICD-10-CM | POA: Insufficient documentation

## 2011-04-09 DIAGNOSIS — M869 Osteomyelitis, unspecified: Secondary | ICD-10-CM

## 2011-04-09 DIAGNOSIS — G609 Hereditary and idiopathic neuropathy, unspecified: Secondary | ICD-10-CM | POA: Insufficient documentation

## 2011-04-09 DIAGNOSIS — L97509 Non-pressure chronic ulcer of other part of unspecified foot with unspecified severity: Secondary | ICD-10-CM | POA: Insufficient documentation

## 2011-04-09 DIAGNOSIS — Z79899 Other long term (current) drug therapy: Secondary | ICD-10-CM | POA: Insufficient documentation

## 2011-04-09 DIAGNOSIS — L989 Disorder of the skin and subcutaneous tissue, unspecified: Secondary | ICD-10-CM | POA: Insufficient documentation

## 2011-04-12 ENCOUNTER — Ambulatory Visit: Payer: Medicaid Other | Admitting: Psychology

## 2011-04-12 ENCOUNTER — Telehealth: Payer: Self-pay | Admitting: Psychology

## 2011-04-12 NOTE — Telephone Encounter (Signed)
[name] left a VM canceling her appointment.  This is several in a row.  On the VM she said that she interested in coming to therapy but is not able to do so right now.  She said that she would call to reschedule when her motivation shifted.  Will let PCP know.

## 2011-04-16 ENCOUNTER — Encounter: Payer: Medicaid Other | Attending: Physical Medicine & Rehabilitation

## 2011-04-16 ENCOUNTER — Ambulatory Visit (HOSPITAL_BASED_OUTPATIENT_CLINIC_OR_DEPARTMENT_OTHER): Payer: Medicaid Other | Admitting: Physical Medicine & Rehabilitation

## 2011-04-16 DIAGNOSIS — G894 Chronic pain syndrome: Secondary | ICD-10-CM

## 2011-04-16 DIAGNOSIS — G8929 Other chronic pain: Secondary | ICD-10-CM | POA: Insufficient documentation

## 2011-04-16 DIAGNOSIS — G609 Hereditary and idiopathic neuropathy, unspecified: Secondary | ICD-10-CM

## 2011-04-16 DIAGNOSIS — Z79899 Other long term (current) drug therapy: Secondary | ICD-10-CM | POA: Insufficient documentation

## 2011-04-16 DIAGNOSIS — R209 Unspecified disturbances of skin sensation: Secondary | ICD-10-CM | POA: Insufficient documentation

## 2011-04-16 DIAGNOSIS — E119 Type 2 diabetes mellitus without complications: Secondary | ICD-10-CM | POA: Insufficient documentation

## 2011-04-17 NOTE — Assessment & Plan Note (Signed)
This is a patient of Dr. Wynn Banker seen for bilateral hand and foot pain, polyneuropathy in a stocking glove-type format.  She rates her average pain at a 5, it is a sharp, burning, dull, tingling, aching pain.  General activity level is 10.  Pain is worse in the evening and night.  Sleep patterns are poor to fair.  Pain is worse with walking, standing and activity.  Rest, medication, and massage helps.  She walks with and without assistance.  She then climb steps or drive.  She needs help with transfers.  Functionally, she is on disability and needs help with household duties.  REVIEW OF SYSTEMS:  Notable for the difficulties as described above as well as some GI issues, spasm, dizziness, depression, anxiety.  No suicidal thoughts or aberrant behaviors.  PAST MEDICAL HISTORY, SOCIAL HISTORY, AND FAMILY HISTORY:  Unchanged. Last UDS and pill count consistent.  PHYSICAL EXAMINATION:  Blood pressure is 145/56, pulse 101, respirations 16, O2 sats 95 on room air.  Motor strength is intact in the upper and lower extremities.  Sensation is diminished.  Constitutionally, she is obese.  She is alert and oriented x3.  She has normal gait.  ASSESSMENT:  Polyneuropathy of upper and lower extremities.  PLAN: 1. Refill Klonopin 1 mg 1 p.o. at bedtime, 30 with no refills. 2. MS Contin CR 30 mg 1 p.o. t.i.d., 90 with no refill. 3. Oxycodone 10 mg 1 p.o. daily, 30 with no refills.  Her questions     were encouraged and answered.  She will follow up with Dr.     Wynn Banker in a month.     Samreet Edenfield [name] Electronically Signed    RLW/MedQ D:  04/16/2011 15:51:39  T:  04/17/2011 02:19:42  Job #:  [id number]

## 2011-04-19 ENCOUNTER — Ambulatory Visit: Payer: Medicaid Other | Admitting: Psychology

## 2011-04-23 ENCOUNTER — Encounter: Payer: Self-pay | Admitting: Family Medicine

## 2011-04-23 ENCOUNTER — Other Ambulatory Visit: Payer: Self-pay | Admitting: Family Medicine

## 2011-04-23 DIAGNOSIS — G8929 Other chronic pain: Secondary | ICD-10-CM | POA: Insufficient documentation

## 2011-04-26 ENCOUNTER — Ambulatory Visit: Payer: Medicaid Other | Admitting: Psychology

## 2011-05-07 ENCOUNTER — Encounter (HOSPITAL_BASED_OUTPATIENT_CLINIC_OR_DEPARTMENT_OTHER): Payer: Medicaid Other

## 2011-05-07 DIAGNOSIS — E1169 Type 2 diabetes mellitus with other specified complication: Secondary | ICD-10-CM | POA: Insufficient documentation

## 2011-05-07 DIAGNOSIS — G609 Hereditary and idiopathic neuropathy, unspecified: Secondary | ICD-10-CM | POA: Insufficient documentation

## 2011-05-07 DIAGNOSIS — L97509 Non-pressure chronic ulcer of other part of unspecified foot with unspecified severity: Secondary | ICD-10-CM | POA: Insufficient documentation

## 2011-05-07 DIAGNOSIS — Z79899 Other long term (current) drug therapy: Secondary | ICD-10-CM | POA: Insufficient documentation

## 2011-05-11 ENCOUNTER — Ambulatory Visit (HOSPITAL_BASED_OUTPATIENT_CLINIC_OR_DEPARTMENT_OTHER): Payer: Medicaid Other | Admitting: Physical Medicine & Rehabilitation

## 2011-05-11 ENCOUNTER — Encounter: Payer: Medicaid Other | Attending: Physical Medicine & Rehabilitation

## 2011-05-11 DIAGNOSIS — E1349 Other specified diabetes mellitus with other diabetic neurological complication: Secondary | ICD-10-CM

## 2011-05-11 DIAGNOSIS — Z79899 Other long term (current) drug therapy: Secondary | ICD-10-CM | POA: Insufficient documentation

## 2011-05-11 DIAGNOSIS — E1142 Type 2 diabetes mellitus with diabetic polyneuropathy: Secondary | ICD-10-CM

## 2011-05-11 DIAGNOSIS — E119 Type 2 diabetes mellitus without complications: Secondary | ICD-10-CM | POA: Insufficient documentation

## 2011-05-11 DIAGNOSIS — E1365 Other specified diabetes mellitus with hyperglycemia: Secondary | ICD-10-CM

## 2011-05-11 DIAGNOSIS — G8929 Other chronic pain: Secondary | ICD-10-CM | POA: Insufficient documentation

## 2011-05-11 DIAGNOSIS — R209 Unspecified disturbances of skin sensation: Secondary | ICD-10-CM | POA: Insufficient documentation

## 2011-05-11 DIAGNOSIS — G609 Hereditary and idiopathic neuropathy, unspecified: Secondary | ICD-10-CM | POA: Insufficient documentation

## 2011-05-11 NOTE — Assessment & Plan Note (Signed)
REASON FOR VISIT:  Diabetic neuropathy.  A 41 year old with chronic nonhealing toe wound.  She has had no significant relief with Neurontin, Topamax, and Lyrica.  She has tried other narcotic analgesics such as methadone, Dilaudid, and oxycodone. She has done relatively well with MS Contin, but states that it makes her tired.  Her pain scores have been in the 6-7/10 range which is really at baseline for her.  She has had no new medical issues other than the foot.  She has had no progressive weakness, however, some occasional falls.  REVIEW OF SYSTEMS:  Positive for weakness, numbness, tingling, trouble walking, spasms, dizziness, depression, anxiety, nausea, diarrhea, constipation, and abdominal pain.  She sees a Veterinary surgeon for her depression.  Pain is described as sharp, burning, dull, stabbing, tingling, aching, mainly in the feet and hands.  SOCIAL HISTORY:  Separated, lives with her daughter and her mother.  No drug or alcohol use.  PHYSICAL EXAMINATION:  VITAL SIGNS:  Blood pressure 131/77, pulse 105, respiratory rate 16, O2 saturation 96% on room air, and weight 283 pounds. GENERAL:  An obese female, in no acute distress. MUSCULOSKELETAL:  She has intrinsic minus foot deformities bilaterally. At the base of the right great toe in the plantar surface, There is a grade 2 ulcer.  There is some surrounding erythema of the great toe, some bogginess in the dorsal surface.  Sensation is reduced to pinprick and light touch below the ankle.  She has decreased toe extension and flexion, but she has good ankle dorsiflex strength and plantar flexion strength.  Gait shows no evidence of toe drag or knee instability.  IMPRESSION:  Diabetic neuropathy, severe sensory loss and neuropathic pain.  PLAN:  We will set her up with Wound Center.  We will write some Keflex 500 t.i.d. and we will change her MS Contin to Opana ER 20 mg b.i.d. to see if this helps with her tiredness.  Discussed with  the patient, agrees with plan.  Nursing visit in 1 month.  We will have nursing redress her wound.  Hopefully, she can get into Wound Center soon.     Erick Colace, M.D. Electronically Signed    AEK/MedQ D:  05/11/2011 16:19:45  T:  05/11/2011 21:33:05  Job #:  [id number]  cc:   Wound Center

## 2011-05-15 ENCOUNTER — Telehealth: Payer: Self-pay | Admitting: Physical Medicine & Rehabilitation

## 2011-05-15 NOTE — Telephone Encounter (Signed)
Spoke to pt and she is experiencing some N/D. Opana is also not helping with pain. She is wanting to switch back to MS Contin. Would this be okay with you?

## 2011-05-15 NOTE — Telephone Encounter (Signed)
Opana not agreeing with her and not working.  Wants to go back to what she was on-MS Contin for breakthru pain.  Will bring Opana in that she did not use.

## 2011-05-15 NOTE — Telephone Encounter (Signed)
Lm with pt to call back and let us know exactly what kind of sx she is having.

## 2011-05-16 NOTE — Telephone Encounter (Signed)
By looking her chart I see that she was on MS Contin CR 30mg  1 tab TID #90. Her last fill was on 04/16/11. We can look at this tomorrow while you are in the office, that if you need me to look up anything further I can do that. Thanks.

## 2011-05-17 MED ORDER — MORPHINE SULFATE CR 30 MG PO TB12: 30.0000 mg | ORAL_TABLET | Freq: Three times a day (TID) | ORAL | Status: DC

## 2011-05-17 NOTE — Telephone Encounter (Signed)
Addended by: Caryl Ada on: 05/17/2011 12:38 PM   Modules accepted: Orders

## 2011-05-17 NOTE — Telephone Encounter (Signed)
Lm with patient that she will need to bring in the Opana she has left and then we will give her a refill on the MS Contin per Dr. Wynn Banker.

## 2011-05-18 ENCOUNTER — Telehealth: Payer: Self-pay | Admitting: Physical Medicine & Rehabilitation

## 2011-05-18 NOTE — Telephone Encounter (Signed)
Will be here Friday to p/u MS Contin and drop off Opana.

## 2011-05-28 ENCOUNTER — Encounter: Payer: Self-pay | Admitting: Family Medicine

## 2011-05-28 DIAGNOSIS — G629 Polyneuropathy, unspecified: Secondary | ICD-10-CM

## 2011-05-28 NOTE — Progress Notes (Signed)
Updated narcotic list. Narcotic provided by pain clinic and not to be prescribed by [facility].

## 2011-05-30 ENCOUNTER — Telehealth: Payer: Self-pay | Admitting: Psychology

## 2011-05-30 NOTE — Telephone Encounter (Signed)
[name] called to request an appointment for her daughter stating that her daughter was in a "crisis situation."  I have never met the daughter.  Called her back and left a VM letting her know that I don't see kids or adolescents in my practice here but would be happy to discuss a referral elsewhere.  Also, suggested that if the crisis involved a safety issue, she should take her daughter to [facility] or the ED for an evaluation.  Asked her to call me back at her earliest convenience.

## 2011-06-01 ENCOUNTER — Ambulatory Visit: Payer: Medicaid Other | Admitting: Family Medicine

## 2011-06-07 ENCOUNTER — Telehealth: Payer: Self-pay | Admitting: Family Medicine

## 2011-06-07 NOTE — Telephone Encounter (Signed)
Patient is wanting to speak to the nurse about a referral for a gastroenterologist.  She is schedule to see Dr. Durene Cal on 3/21.

## 2011-06-07 NOTE — Telephone Encounter (Signed)
LMOVM for pt to return call. Please find out what she needs when she calls back. Shadoe Bethel, Maryjo Rochester

## 2011-06-08 ENCOUNTER — Other Ambulatory Visit: Payer: Self-pay | Admitting: *Deleted

## 2011-06-08 MED ORDER — CLONAZEPAM 1 MG PO TABS: 1.0000 mg | ORAL_TABLET | Freq: Every evening | ORAL | Status: DC | PRN

## 2011-06-12 ENCOUNTER — Encounter: Payer: Medicaid Other | Attending: Physical Medicine & Rehabilitation | Admitting: *Deleted

## 2011-06-12 ENCOUNTER — Encounter: Payer: Self-pay | Admitting: *Deleted

## 2011-06-12 VITALS — BP 140/75 | HR 118 | Resp 18 | Ht 72.0 in | Wt 278.0 lb

## 2011-06-12 DIAGNOSIS — L989 Disorder of the skin and subcutaneous tissue, unspecified: Secondary | ICD-10-CM | POA: Insufficient documentation

## 2011-06-12 DIAGNOSIS — E1142 Type 2 diabetes mellitus with diabetic polyneuropathy: Secondary | ICD-10-CM | POA: Insufficient documentation

## 2011-06-12 DIAGNOSIS — E1149 Type 2 diabetes mellitus with other diabetic neurological complication: Secondary | ICD-10-CM

## 2011-06-12 DIAGNOSIS — G629 Polyneuropathy, unspecified: Secondary | ICD-10-CM

## 2011-06-12 DIAGNOSIS — M79609 Pain in unspecified limb: Secondary | ICD-10-CM | POA: Insufficient documentation

## 2011-06-12 DIAGNOSIS — G609 Hereditary and idiopathic neuropathy, unspecified: Secondary | ICD-10-CM

## 2011-06-12 DIAGNOSIS — G894 Chronic pain syndrome: Secondary | ICD-10-CM

## 2011-06-12 MED ORDER — OXYCODONE HCL 10 MG PO TABS: 10.0000 mg | ORAL_TABLET | Freq: Every day | ORAL | Status: DC

## 2011-06-12 MED ORDER — MORPHINE SULFATE CR 30 MG PO TB12: 30.0000 mg | ORAL_TABLET | Freq: Three times a day (TID) | ORAL | Status: DC

## 2011-06-12 NOTE — Progress Notes (Signed)
States foot wound is healing poorly and wound care center are considering casting her lower leg to minimize contact. She has gone through this procedure before. She wants to discuss need for monthly office visits with Dr Wynn Banker next month. No other questions voiced.

## 2011-06-14 ENCOUNTER — Encounter: Payer: Self-pay | Admitting: Family Medicine

## 2011-06-14 ENCOUNTER — Ambulatory Visit (INDEPENDENT_AMBULATORY_CARE_PROVIDER_SITE_OTHER): Payer: Medicaid Other | Admitting: Family Medicine

## 2011-06-14 VITALS — BP 120/82 | HR 105 | Temp 98.7°F | Ht 72.0 in | Wt 277.0 lb

## 2011-06-14 DIAGNOSIS — E781 Pure hyperglyceridemia: Secondary | ICD-10-CM

## 2011-06-14 DIAGNOSIS — E1149 Type 2 diabetes mellitus with other diabetic neurological complication: Secondary | ICD-10-CM

## 2011-06-14 DIAGNOSIS — L97509 Non-pressure chronic ulcer of other part of unspecified foot with unspecified severity: Secondary | ICD-10-CM | POA: Insufficient documentation

## 2011-06-14 DIAGNOSIS — E1142 Type 2 diabetes mellitus with diabetic polyneuropathy: Secondary | ICD-10-CM

## 2011-06-14 DIAGNOSIS — K589 Irritable bowel syndrome without diarrhea: Secondary | ICD-10-CM

## 2011-06-14 DIAGNOSIS — L97409 Non-pressure chronic ulcer of unspecified heel and midfoot with unspecified severity: Secondary | ICD-10-CM

## 2011-06-14 LAB — COMPREHENSIVE METABOLIC PANEL
Alkaline Phosphatase: 48 U/L (ref 39–117)
BUN: 10 mg/dL (ref 6–23)
CO2: 22 mEq/L (ref 19–32)
Creat: 0.53 mg/dL (ref 0.50–1.10)
Glucose, Bld: 124 mg/dL — ABNORMAL HIGH (ref 70–99)
Total Bilirubin: 0.5 mg/dL (ref 0.3–1.2)

## 2011-06-14 NOTE — Assessment & Plan Note (Signed)
a1c at goal of less than 7. Patient will attempt to improve diet as she has been drinking some sodas. Medication compliant. WIll check BMET today as on lisinopril for renal protection.

## 2011-06-14 NOTE — Assessment & Plan Note (Addendum)
Patient adamant about referral and has seen GI previously. Will refer. 4 diarrhea BM per day. Already on SSRI, not willing to exercise, already has high fiber diet.

## 2011-06-14 NOTE — Assessment & Plan Note (Signed)
Will check triglycerides today as well as direct LDL (goal <100). Goal for triglycerides would be less than 500. May need additional therapy.

## 2011-06-14 NOTE — Patient Instructions (Signed)
Dear  Placido Sou,   It was great to see you today. Thank you for coming to clinic.   1. For your chronic diarrhea and possible IBS, I am going to place a referral for gastroenterology.  2. I am going to send you for labs today to check your cholesterol and kidney and liver function. I will send a letter with results or call if any changes need to be made.  3. For your foot wound, I want you to follow up with wound care within the next week. If any expanding redness, increasing pain, or fevers, you should call us to be seen immediately.   Please follow up in clinic in 12 weeks . Please call earlier if you have any questions or concerns.   Sincerely,  Dr. Tana Conch

## 2011-06-14 NOTE — Progress Notes (Signed)
  Subjective:    Patient ID: [name], female    DOB: Jun 14, 1970, 41 y.o.   MRN: [id number]  HPI 1. IBS history-patient on SSRI. Has barriers to exercising and is not willing to attempt. ALready has high fiber diet as eats vegetarian diet. Patient was seen by [facility] in past and requests to see Dr. Randa Evens again. She says she has been having 4 BMs watery per day for last few months. Has some mild cramping which is sometimes relieved by BM. DIscussed making changes from PCP perspective but patient adamant about having GI referral.  2. DIABETES Medications taking and tolerating-yes Blood Sugars per patient-fasting-low 100s Hypoglycemia symptoms (shaky, sweaty, hungry,  anxious, tremor, palpitations, confusion, behavior change)-yes, but anxiety related. Has checked sugars and >100 typically.   Last eye exam- within 7 months Last foot exam-monthly with pain management Last microalbumin/on ace inhibitor-on ace Daily foot monitoring-yes  ROS- (-)Polyuria, (-) Polydipsia,  (+, normal for patient)nocturia,  (-) Vision changes, (+) feet or hand numbness/pain/tingling  Diabetic Labs:  Lab Results  Component Value Date   HGBA1C 6.6 06/14/2011   HGBA1C 6.0 12/04/2010   HGBA1C 6.3 07/05/2010   3. Chronic foot ulcer-patient has been seeing wound care for wound at base of right toe.   4. Hypertriglyceridemia-patient tolerating statin. Interested in knowing if triglycerides have improved.  Review of Systems negative except as noted in HPI      Objective:   Physical Exam BP 120/82  Pulse 105  Temp(Src) 98.7 F (37.1 C) (Oral)  Ht 6' (1.829 m)  Wt 277 lb (125.646 kg)  BMI 37.57 kg/m2  LMP 06/11/2011 Constitutional: She is oriented to person, place, and time. She appears well-developed and well-nourished. No distress.  Head: Normocephalic and atraumatic.  Neck: Normal range of motion. Neck supple.  Cardiovascular: Normal rate, regular rhythm, normal heart sounds and intact distal  pulses.  Exam reveals no gallop.  No murmur heard. Pulmonary/Chest: Effort normal and breath sounds normal. No respiratory distress. She has no wheezes. She has no rales. She exhibits no tenderness.  Abdominal: Soft. Bowel sounds are normal. She exhibits no distension. Very mild diffusee tenderness. There is no rebound.  Neurological: She is alert and oriented to person, place, and time. She has normal reflexes at knees but no reflex at heel elicited. A sensory deficit is present with reduced sensation.      Fine touch sensation only noted on Bilateral heels ans at 10cm below tibial plateau. Gross touch still intact.   Skin: Skin is warm and dry. She is not diaphoretic. At base of right first toe, chronic ulceration noted to be worsened from previous. Epidermis has eroded in a 3 x2 cm area. 1+ pulses DP and PT noted.      Assessment & Plan:

## 2011-06-14 NOTE — Assessment & Plan Note (Signed)
Attempted to upload photo but unable to do so. Patient has been seen by wound care in previous weeks but has decided to stop going. Strongly encouraged patient to follow up with wound care center in next week. Told patient there is potential for her to lose her foot or toes if she doesn't follow up regularly.

## 2011-06-15 ENCOUNTER — Telehealth: Payer: Self-pay | Admitting: Family Medicine

## 2011-06-15 ENCOUNTER — Encounter: Payer: Self-pay | Admitting: Family Medicine

## 2011-06-15 DIAGNOSIS — E781 Pure hyperglyceridemia: Secondary | ICD-10-CM

## 2011-06-15 LAB — TRIGLYCERIDES: Triglycerides: 605 mg/dL — ABNORMAL HIGH (ref <150)

## 2011-06-15 MED ORDER — FENOFIBRATE 48 MG PO TABS: 48.0000 mg | ORAL_TABLET | Freq: Every day | ORAL | Status: DC

## 2011-06-15 NOTE — Assessment & Plan Note (Signed)
Triglyceride elevated still > 500. Will start fenofibrate at this time and recheck in 2-3 months.

## 2011-06-15 NOTE — Telephone Encounter (Signed)
Called patient and left voicemail that would send rx to pharmacy for hypertriglyceridemia.

## 2011-06-22 ENCOUNTER — Other Ambulatory Visit: Payer: Self-pay | Admitting: Family Medicine

## 2011-06-25 NOTE — Telephone Encounter (Signed)
Gave 90 day supply of metformin instead of monthly supply per patient request.

## 2011-07-09 ENCOUNTER — Other Ambulatory Visit: Payer: Self-pay | Admitting: *Deleted

## 2011-07-09 MED ORDER — CLONAZEPAM 1 MG PO TABS: 1.0000 mg | ORAL_TABLET | Freq: Every evening | ORAL | Status: DC | PRN

## 2011-07-13 ENCOUNTER — Ambulatory Visit (HOSPITAL_BASED_OUTPATIENT_CLINIC_OR_DEPARTMENT_OTHER): Payer: Medicaid Other | Admitting: Physical Medicine & Rehabilitation

## 2011-07-13 ENCOUNTER — Encounter (HOSPITAL_BASED_OUTPATIENT_CLINIC_OR_DEPARTMENT_OTHER): Payer: Medicaid Other | Attending: General Surgery

## 2011-07-13 ENCOUNTER — Encounter: Payer: Medicaid Other | Attending: Physical Medicine & Rehabilitation

## 2011-07-13 ENCOUNTER — Encounter: Payer: Self-pay | Admitting: Physical Medicine & Rehabilitation

## 2011-07-13 VITALS — BP 135/58 | HR 103 | Ht 72.0 in | Wt 279.0 lb

## 2011-07-13 DIAGNOSIS — E669 Obesity, unspecified: Secondary | ICD-10-CM | POA: Insufficient documentation

## 2011-07-13 DIAGNOSIS — G609 Hereditary and idiopathic neuropathy, unspecified: Secondary | ICD-10-CM | POA: Insufficient documentation

## 2011-07-13 DIAGNOSIS — Z79899 Other long term (current) drug therapy: Secondary | ICD-10-CM | POA: Insufficient documentation

## 2011-07-13 DIAGNOSIS — G8929 Other chronic pain: Secondary | ICD-10-CM | POA: Insufficient documentation

## 2011-07-13 DIAGNOSIS — L97509 Non-pressure chronic ulcer of other part of unspecified foot with unspecified severity: Secondary | ICD-10-CM | POA: Insufficient documentation

## 2011-07-13 DIAGNOSIS — E1169 Type 2 diabetes mellitus with other specified complication: Secondary | ICD-10-CM | POA: Insufficient documentation

## 2011-07-13 DIAGNOSIS — G629 Polyneuropathy, unspecified: Secondary | ICD-10-CM

## 2011-07-13 DIAGNOSIS — R209 Unspecified disturbances of skin sensation: Secondary | ICD-10-CM | POA: Insufficient documentation

## 2011-07-13 DIAGNOSIS — E119 Type 2 diabetes mellitus without complications: Secondary | ICD-10-CM | POA: Insufficient documentation

## 2011-07-13 MED ORDER — MORPHINE SULFATE CR 30 MG PO TB12: 30.0000 mg | ORAL_TABLET | Freq: Three times a day (TID) | ORAL | Status: DC

## 2011-07-13 NOTE — Progress Notes (Signed)
  Subjective:    Patient ID: [name], female    DOB: [birth year], 41 y.o.   MRN: [id number]  HPI Still problems with sleep. Does better with 2 mg of Clonopin. Has been on this in the past. Taking some breakthrough medicine for the last month. Back on morphine sulfate extended release 30 mg 3 times per day which has been more effective than Opana  Has an appointment with wound clinic today. This is her first appointment recently Pain Inventory Average Pain 5 Pain Right Now 5 My pain is constant, sharp, burning, dull, stabbing, tingling and aching  In the last 24 hours, has pain interfered with the following? General activity 10 Relation with others 10 Enjoyment of life 10 What TIME of day is your pain at its worst? night evening Sleep (in general) Poor  Pain is worse with: walking, standing and some activites Pain improves with: medication Relief from Meds: 8  Mobility walk without assistance walk with assistance use a cane ability to climb steps?  no do you drive?  no  Function disabled: date disabled   Neuro/Psych spasms dizziness depression anxiety  Prior Studies Any changes since last visit?  no  Physicians involved in your care Any changes since last visit?  no  Review of Systems  Constitutional: Negative.   HENT: Negative.   Eyes: Negative.   Respiratory: Negative.   Cardiovascular: Negative.   Gastrointestinal: Negative.   Genitourinary: Negative.   Musculoskeletal: Negative.   Skin: Negative.   Neurological: Positive for dizziness.  Hematological: Negative.   Psychiatric/Behavioral: Positive for dysphoric mood.       Objective:   Physical Exam  Constitutional: She is oriented to person, place, and time. She appears well-developed.  Neurological: She is alert and oriented to person, place, and time. She displays atrophy. A sensory deficit is present. She exhibits normal muscle tone. Gait abnormal.  Reflex Scores:      Patellar reflexes  are 0 on the right side and 0 on the left side.      Achilles reflexes are 0 on the right side and 0 on the left side.      Lateral foot intrinsic atrophy. No tibialis anterior atrophy noted The patient has decreased sensation to pinprick, light touch and proprioception in both feet. Pulses are normal Right great toe is bandaged.  Skin: Skin is warm. No erythema.  Psychiatric: She has a normal mood and affect.          Assessment & Plan:  #1. Severe diabetic polyneuropathy. It is interesting in that her diabetes has been well managed but her neuropathy has been progressive. We've looked for other etiologies and has been [facility] without any additional diagnoses.  She has not done well with the typical medications for neuropathic pain. She's done the best with morphine thus far. Will also bump up her Klonopin to 2 mg at night but discontinued the oxycodone breakthrough medicine.  We discussed the potential interactions between morphine and other CNS depressant medications

## 2011-07-13 NOTE — Progress Notes (Signed)
[facility]  NAME:  [name], [name] NO.:  [id number]  MEDICAL RECORD NO.:  [id number]      DATE OF BIRTH:  1970-09-08  PHYSICIAN:  Ardath Sax, M.D.           VISIT DATE:                                  OFFICE VISIT   [name] is a morbidly obese 41 year old female who is a diabetic and has had problems with a diabetic ulcer on the plantar aspect of her right great toe, right now it is about 7 mm in diameter. She has been treated successfully with collagen and a total contact cast in the past.  Today, I debrided some of the callus, and we put on a collagen dressing, and she will come back Monday for a total contact cast to be applied.  She otherwise is reasonably healthy.  She is on Klonopin and metformin and previous statin.  She says she is allergic to codeine and vancomycin.  She has had her gallbladder removed, and she has been trying to diet and handle her diabetes better, but she is still quite obese.  She is afebrile today, and her blood pressure is 109/74, her temperature is 98.6, pulse is 90, and she was weighed about 300 pounds, so we are going to have her come back Monday and do a total contact cast, and also treat her with collagen.     Ardath Sax, M.D.     PP/MEDQ  D:  07/13/2011  T:  07/13/2011  Job:  [id number]

## 2011-07-13 NOTE — Patient Instructions (Signed)
Followup in wound care center Discontinue oxycodone May increase her Klonopin to 2 tablets at night that is a total of 2 mg. I would expect that you will run out early. Her next prescription for Klonopin should be for 2 mg

## 2011-07-15 ENCOUNTER — Telehealth: Payer: Self-pay | Admitting: Family Medicine

## 2011-07-15 NOTE — Telephone Encounter (Signed)
Left voicemail: called to see if patient had followed up with wound care for chronic diabetic foot ulcer. Emphasized importance of follow up.   Tana Conch, MD, PGY1 07/15/2011 4:03 PM

## 2011-07-23 ENCOUNTER — Telehealth: Payer: Self-pay | Admitting: Physical Medicine & Rehabilitation

## 2011-07-23 NOTE — Telephone Encounter (Signed)
Refill on Klonopin.  There had been a change in Rx.

## 2011-07-24 MED ORDER — CLONAZEPAM 2 MG PO TABS: 2.0000 mg | ORAL_TABLET | Freq: Every evening | ORAL | Status: DC | PRN

## 2011-07-24 NOTE — Telephone Encounter (Signed)
New rx has been called in, pt aware.

## 2011-08-10 ENCOUNTER — Encounter (HOSPITAL_BASED_OUTPATIENT_CLINIC_OR_DEPARTMENT_OTHER): Payer: Medicaid Other | Attending: General Surgery

## 2011-08-10 DIAGNOSIS — E1169 Type 2 diabetes mellitus with other specified complication: Secondary | ICD-10-CM | POA: Insufficient documentation

## 2011-08-10 DIAGNOSIS — L97509 Non-pressure chronic ulcer of other part of unspecified foot with unspecified severity: Secondary | ICD-10-CM | POA: Insufficient documentation

## 2011-08-10 DIAGNOSIS — E669 Obesity, unspecified: Secondary | ICD-10-CM | POA: Insufficient documentation

## 2011-08-11 ENCOUNTER — Other Ambulatory Visit: Payer: Self-pay | Admitting: Family Medicine

## 2011-08-11 DIAGNOSIS — F32A Depression, unspecified: Secondary | ICD-10-CM

## 2011-08-11 DIAGNOSIS — F329 Major depressive disorder, single episode, unspecified: Secondary | ICD-10-CM

## 2011-08-12 NOTE — Telephone Encounter (Signed)
Refilled years worth of Paxil.

## 2011-08-13 ENCOUNTER — Encounter: Payer: Medicaid Other | Attending: Physical Medicine & Rehabilitation | Admitting: Physical Medicine and Rehabilitation

## 2011-08-13 ENCOUNTER — Encounter: Payer: Self-pay | Admitting: Physical Medicine and Rehabilitation

## 2011-08-13 VITALS — BP 142/56 | HR 110 | Resp 14 | Ht 72.0 in | Wt 280.0 lb

## 2011-08-13 DIAGNOSIS — G589 Mononeuropathy, unspecified: Secondary | ICD-10-CM

## 2011-08-13 DIAGNOSIS — L97509 Non-pressure chronic ulcer of other part of unspecified foot with unspecified severity: Secondary | ICD-10-CM | POA: Insufficient documentation

## 2011-08-13 DIAGNOSIS — M79609 Pain in unspecified limb: Secondary | ICD-10-CM | POA: Insufficient documentation

## 2011-08-13 DIAGNOSIS — R29898 Other symptoms and signs involving the musculoskeletal system: Secondary | ICD-10-CM | POA: Insufficient documentation

## 2011-08-13 DIAGNOSIS — R209 Unspecified disturbances of skin sensation: Secondary | ICD-10-CM | POA: Insufficient documentation

## 2011-08-13 DIAGNOSIS — G609 Hereditary and idiopathic neuropathy, unspecified: Secondary | ICD-10-CM | POA: Insufficient documentation

## 2011-08-13 DIAGNOSIS — G629 Polyneuropathy, unspecified: Secondary | ICD-10-CM

## 2011-08-13 MED ORDER — MORPHINE SULFATE ER 30 MG PO TBCR: 30.0000 mg | EXTENDED_RELEASE_TABLET | Freq: Three times a day (TID) | ORAL | Status: DC

## 2011-08-13 MED ORDER — CLONAZEPAM 2 MG PO TABS: 2.0000 mg | ORAL_TABLET | Freq: Every evening | ORAL | Status: DC | PRN

## 2011-08-13 NOTE — Progress Notes (Deleted)
  Subjective:    Patient ID: [name], female    DOB: 1970/11/21, 41 y.o.   MRN: [id number]  HPI Still problems with sleep. Does better with 2 mg of Clonopin. Has been on this in the past.She is taking morphine sulfate extended release 30 mg 3 times per day which has been more effective than Opana. She states, that the current medications controlling her symptoms. She still sees wound care for her open right toe. Her DM is very well controlled.  Pain Inventory Average Pain 5 Pain Right Now 4 My pain is sharp, burning, dull, stabbing, tingling and aching  In the last 24 hours, has pain interfered with the following? General activity 10 Relation with others 10 Enjoyment of life 10 What TIME of day is your pain at its worst? evening Sleep (in general) Poor  Pain is worse with: walking and standing Pain improves with: rest and medication Relief from Meds: 6  Mobility ability to climb steps?  no do you drive?  no needs help with transfers Do you have any goals in this area?  no  Function I need assistance with the following:  bathing, meal prep, household duties and shopping  Neuro/Psych weakness numbness trouble walking spasms dizziness  Prior Studies Any changes since last visit?  no  Physicians involved in your care Any changes since last visit?  no  Review of Systems  HENT: Negative.   Eyes: Negative.   Respiratory: Negative.   Cardiovascular: Negative.   Gastrointestinal: Negative.   Genitourinary: Negative.   Musculoskeletal: Positive for gait problem.  Skin: Negative.   Neurological: Positive for dizziness, weakness and numbness.  Hematological: Negative.   Psychiatric/Behavioral: Negative.        Objective:   Physical Exam  Constitutional: She is oriented to person, place, and time. She appears well-developed.  Neurological: She is alert and oriented to person, place, and time. She displays atrophy. A sensory deficit is present. She exhibits  normal muscle tone. Gait abnormal.  Reflex Scores:  Patellar reflexes are 2+ on the right side and 2+ on the left side.  Achilles reflexes trace on the right side and on the left side. Lateral foot intrinsic atrophy. No tibialis anterior atrophy noted The patient has decreased sensation to pinprick, light touch  in both feet, proprioception ok bilateral. Pulses are normal Right great toe is bandaged.  Skin: Skin is warm. No erythema.  Psychiatric: She has a normal mood and affect Not able to heel or toe walk not able to tandem walk.      Assessment & Plan:  Patient with peripheral neuropathy. Chronic right toe ulcer.  Pain is controlled with current medication. Advised patient to exercise, to improve her walking.

## 2011-08-13 NOTE — Patient Instructions (Signed)
Continue the medication. Try to be as active as pain permits.Follow up in 1 month.

## 2011-08-16 ENCOUNTER — Other Ambulatory Visit: Payer: Self-pay | Admitting: Family Medicine

## 2011-08-16 NOTE — Telephone Encounter (Signed)
Refilled metformin

## 2011-08-22 ENCOUNTER — Other Ambulatory Visit: Payer: Self-pay | Admitting: Physical Medicine & Rehabilitation

## 2011-08-23 ENCOUNTER — Telehealth: Payer: Self-pay

## 2011-08-23 MED ORDER — CLONAZEPAM 2 MG PO TABS: 2.0000 mg | ORAL_TABLET | Freq: Every evening | ORAL | Status: DC | PRN

## 2011-08-23 NOTE — Telephone Encounter (Signed)
Rx has been called in, pt aware. 

## 2011-08-23 NOTE — Telephone Encounter (Signed)
Pt would like refill on klonopin.  

## 2011-09-12 ENCOUNTER — Encounter: Payer: Medicaid Other | Attending: Physical Medicine & Rehabilitation | Admitting: Physical Medicine and Rehabilitation

## 2011-09-12 ENCOUNTER — Encounter: Payer: Self-pay | Admitting: Physical Medicine and Rehabilitation

## 2011-09-12 VITALS — BP 143/66 | HR 100 | Resp 16 | Ht 72.0 in | Wt 274.0 lb

## 2011-09-12 DIAGNOSIS — G609 Hereditary and idiopathic neuropathy, unspecified: Secondary | ICD-10-CM | POA: Insufficient documentation

## 2011-09-12 DIAGNOSIS — F3289 Other specified depressive episodes: Secondary | ICD-10-CM | POA: Insufficient documentation

## 2011-09-12 DIAGNOSIS — E119 Type 2 diabetes mellitus without complications: Secondary | ICD-10-CM | POA: Insufficient documentation

## 2011-09-12 DIAGNOSIS — M79671 Pain in right foot: Secondary | ICD-10-CM

## 2011-09-12 DIAGNOSIS — F329 Major depressive disorder, single episode, unspecified: Secondary | ICD-10-CM | POA: Insufficient documentation

## 2011-09-12 DIAGNOSIS — M79643 Pain in unspecified hand: Secondary | ICD-10-CM

## 2011-09-12 DIAGNOSIS — F411 Generalized anxiety disorder: Secondary | ICD-10-CM | POA: Insufficient documentation

## 2011-09-12 DIAGNOSIS — R209 Unspecified disturbances of skin sensation: Secondary | ICD-10-CM | POA: Insufficient documentation

## 2011-09-12 DIAGNOSIS — M79609 Pain in unspecified limb: Secondary | ICD-10-CM | POA: Insufficient documentation

## 2011-09-12 DIAGNOSIS — M79672 Pain in left foot: Secondary | ICD-10-CM

## 2011-09-12 DIAGNOSIS — L97509 Non-pressure chronic ulcer of other part of unspecified foot with unspecified severity: Secondary | ICD-10-CM | POA: Insufficient documentation

## 2011-09-12 DIAGNOSIS — R279 Unspecified lack of coordination: Secondary | ICD-10-CM | POA: Insufficient documentation

## 2011-09-12 DIAGNOSIS — G894 Chronic pain syndrome: Secondary | ICD-10-CM | POA: Insufficient documentation

## 2011-09-12 MED ORDER — CLONAZEPAM 2 MG PO TABS: 2.0000 mg | ORAL_TABLET | Freq: Every evening | ORAL | Status: DC | PRN

## 2011-09-12 MED ORDER — MORPHINE SULFATE ER 30 MG PO TBCR: 30.0000 mg | EXTENDED_RELEASE_TABLET | Freq: Three times a day (TID) | ORAL | Status: DC

## 2011-09-12 NOTE — Patient Instructions (Signed)
Continue with exercising, start with pilates DVD. Continue with medication

## 2011-09-12 NOTE — Progress Notes (Signed)
Subjective:    Patient ID: [name], female    DOB: 01/24/1971, 41 y.o.   MRN: [id number]  HPI The patient complains about chronic hand and feet pain . The patient also complains about numbness and tingling in her hands and feet.. The problem has been stable. Fell after stepping in a deep hole, without severe injury.States, that she has difficulty with her balance. The patient states that she sees wound care for her chronic ulcer in her toe, and that it has improved some. Pain Inventory Average Pain 4 Pain Right Now 4 My pain is sharp, burning, dull, stabbing, tingling and aching  In the last 24 hours, has pain interfered with the following? General activity 10 Relation with others 10 Enjoyment of life 10 What TIME of day is your pain at its worst? Evening and Night Sleep (in general) Fair  Pain is worse with: walking, standing and some activites Pain improves with: medication Relief from Meds: 8  Mobility walk without assistance walk with assistance use a cane how many minutes can you walk? 15 ability to climb steps?  no do you drive?  no  Function disabled: date disabled  I need assistance with the following:  bathing, meal prep, household duties and shopping  Neuro/Psych weakness numbness tingling trouble walking spasms dizziness confusion  Prior Studies Any changes since last visit?  no  Physicians involved in your care Any changes since last visit?  no   No family history on file. History   Social History  . Marital Status: Legally Separated    Spouse Name: N/A    Number of Children: N/A  . Years of Education: N/A   Social History Main Topics  . Smoking status: Never Smoker   . Smokeless tobacco: None  . Alcohol Use: None  . Drug Use: None  . Sexually Active: None   Other Topics Concern  . None   Social History Narrative  . None   No past surgical history on file. Past Medical History  Diagnosis Date  . Type II or unspecified  type diabetes mellitus with neurological manifestations, not stated as uncontrolled 08/08/2009  . DEPRESSION/ANXIETY 08/08/2009  . IBS 02/02/2010  . Polyneuropathy 12/04/2010   BP 143/66  Pulse 100  Resp 16  Ht 6' (1.829 m)  Wt 274 lb (124.286 kg)  BMI 37.16 kg/m2  SpO2 95%      Review of Systems  HENT: Negative.   Eyes: Negative.   Respiratory: Negative.   Cardiovascular: Negative.   Gastrointestinal: Negative.   Genitourinary: Negative.   Musculoskeletal: Negative.   Skin: Negative.   Neurological: Positive for weakness and numbness.  Hematological: Negative.   Psychiatric/Behavioral: Negative.        Objective:   Physical Exam  Constitutional: She is oriented to person, place, and time. She appears well-developed.  Neurological: She is alert and oriented to person, place, and time. She displays atrophy. A sensory deficit is present. She exhibits normal muscle tone. Gait abnormal.  Reflex Scores:  Patellar reflexes are 2+ on the right side and 2+ on the left side.  Achilles reflexes trace on the right side and on the left side. Lateral foot intrinsic atrophy. No tibialis anterior atrophy noted The patient has decreased sensation to pinprick, light touch in both feet, proprioception ok bilateral. Pulses are normal Right great toe is bandaged.  Skin: Skin is warm. No erythema.  Psychiatric: She has a normal mood and affect  Not able to heel or toe walk not  able to tandem walk.       Assessment & Plan:  This is a 41 year old female with 1. Chronic pain syndrome 2. Polyneuropathy 3. Chronic foot ulcer 4. Diabetes 5. Anxiety,  and depression Plan : Continue with medication, continue with exercising, advised patient to start Pilates with the DVD she owns, would like to order physical therapy for balance training, but the patient feels uncomfortable  and anxious to go out of her house. Patient should followup with wound care for her toe. Follow up in 1 month

## 2011-09-13 NOTE — Progress Notes (Signed)
  Subjective:    Patient ID: [name], female    DOB: Dec 15, 1970, 41 y.o.   MRN: [id number]  Hand Pain  Associated symptoms include numbness.  Foot Pain Associated symptoms include numbness and weakness.   Still problems with sleep. Does better with 2 mg of Clonopin. Has been on this in the past.She is taking morphine sulfate extended release 30 mg 3 times per day which has been more effective than Opana. She states, that the current medications controlling her symptoms. She still sees wound care for her open right toe. Her DM is very well controlled.  Pain Inventory Average Pain 5 Pain Right Now 4 My pain is sharp, burning, dull, stabbing, tingling and aching  In the last 24 hours, has pain interfered with the following? General activity 10 Relation with others 10 Enjoyment of life 10 What TIME of day is your pain at its worst? evening Sleep (in general) Poor  Pain is worse with: walking and standing Pain improves with: rest and medication Relief from Meds: 6  Mobility ability to climb steps?  no do you drive?  no needs help with transfers Do you have any goals in this area?  no  Function I need assistance with the following:  bathing, meal prep, household duties and shopping  Neuro/Psych weakness numbness trouble walking spasms dizziness  Prior Studies Any changes since last visit?  no  Physicians involved in your care Any changes since last visit?  no  Review of Systems  HENT: Negative.   Eyes: Negative.   Respiratory: Negative.   Cardiovascular: Negative.   Gastrointestinal: Negative.   Genitourinary: Negative.   Musculoskeletal: Positive for gait problem.  Skin: Negative.   Neurological: Positive for dizziness, weakness and numbness.  Hematological: Negative.   Psychiatric/Behavioral: Negative.        Objective:   Physical Exam  Constitutional: She is oriented to person, place, and time. She appears well-developed.  Neurological: She  is alert and oriented to person, place, and time. She displays atrophy. A sensory deficit is present. She exhibits normal muscle tone. Gait abnormal.  Reflex Scores:  Patellar reflexes are 2+ on the right side and 2+ on the left side.  Achilles reflexes trace on the right side and on the left side. Lateral foot intrinsic atrophy. No tibialis anterior atrophy noted The patient has decreased sensation to pinprick, light touch  in both feet, proprioception ok bilateral. Pulses are normal Right great toe is bandaged.  Skin: Skin is warm. No erythema.  Psychiatric: She has a normal mood and affect Not able to heel or toe walk not able to tandem walk.      Assessment & Plan:  Patient with peripheral neuropathy. Chronic right toe ulcer.  Pain is controlled with current medication. Advised patient to exercise, to improve her walking.

## 2011-09-16 ENCOUNTER — Other Ambulatory Visit: Payer: Self-pay | Admitting: Nephrology

## 2011-10-10 ENCOUNTER — Encounter: Payer: Medicaid Other | Attending: Family Medicine | Admitting: Physical Medicine and Rehabilitation

## 2011-10-10 ENCOUNTER — Encounter: Payer: Self-pay | Admitting: Physical Medicine and Rehabilitation

## 2011-10-10 VITALS — BP 142/83 | HR 104 | Resp 14 | Ht 72.0 in | Wt 270.0 lb

## 2011-10-10 DIAGNOSIS — M79609 Pain in unspecified limb: Secondary | ICD-10-CM | POA: Insufficient documentation

## 2011-10-10 DIAGNOSIS — E119 Type 2 diabetes mellitus without complications: Secondary | ICD-10-CM | POA: Insufficient documentation

## 2011-10-10 DIAGNOSIS — L97509 Non-pressure chronic ulcer of other part of unspecified foot with unspecified severity: Secondary | ICD-10-CM | POA: Insufficient documentation

## 2011-10-10 DIAGNOSIS — M79643 Pain in unspecified hand: Secondary | ICD-10-CM

## 2011-10-10 DIAGNOSIS — R209 Unspecified disturbances of skin sensation: Secondary | ICD-10-CM | POA: Insufficient documentation

## 2011-10-10 DIAGNOSIS — F329 Major depressive disorder, single episode, unspecified: Secondary | ICD-10-CM | POA: Insufficient documentation

## 2011-10-10 DIAGNOSIS — F3289 Other specified depressive episodes: Secondary | ICD-10-CM | POA: Insufficient documentation

## 2011-10-10 DIAGNOSIS — G609 Hereditary and idiopathic neuropathy, unspecified: Secondary | ICD-10-CM | POA: Insufficient documentation

## 2011-10-10 DIAGNOSIS — G589 Mononeuropathy, unspecified: Secondary | ICD-10-CM

## 2011-10-10 DIAGNOSIS — G629 Polyneuropathy, unspecified: Secondary | ICD-10-CM

## 2011-10-10 DIAGNOSIS — F411 Generalized anxiety disorder: Secondary | ICD-10-CM | POA: Insufficient documentation

## 2011-10-10 DIAGNOSIS — G894 Chronic pain syndrome: Secondary | ICD-10-CM | POA: Insufficient documentation

## 2011-10-10 MED ORDER — MORPHINE SULFATE ER 30 MG PO TBCR: 30.0000 mg | EXTENDED_RELEASE_TABLET | Freq: Three times a day (TID) | ORAL | Status: DC

## 2011-10-10 MED ORDER — CLONAZEPAM 2 MG PO TABS: 2.0000 mg | ORAL_TABLET | Freq: Every evening | ORAL | Status: DC | PRN

## 2011-10-10 NOTE — Progress Notes (Signed)
Subjective:    Patient ID: [name], female    DOB: 1971-03-11, 41 y.o.   MRN: [id number]  HPI The patient complains about chronic hand and feet pain . The patient also complains about numbness and tingling in her hands and feet.  The problem has been stable.  Pain Inventory Average Pain 4 Pain Right Now 4 My pain is sharp, burning, dull, stabbing, tingling and aching  In the last 24 hours, has pain interfered with the following? General activity 9 Relation with others 9 Enjoyment of life 9 What TIME of day is your pain at its worst? night Sleep (in general) Fair  Pain is worse with: walking, standing and some activites Pain improves with: rest, heat/ice and medication Relief from Meds: 8  Mobility walk without assistance walk with assistance use a cane how many minutes can you walk? 10 ability to climb steps?  no do you drive?  yes needs help with transfers Do you have any goals in this area?  yes  Function disabled: date disabled 2000 I need assistance with the following:  bathing, meal prep, household duties and shopping Do you have any goals in this area?  yes  Neuro/Psych weakness numbness tremor tingling trouble walking spasms dizziness depression anxiety  Prior Studies Any changes since last visit?  no  Physicians involved in your care Any changes since last visit?  no   History reviewed. No pertinent family history. History   Social History  . Marital Status: Legally Separated    Spouse Name: N/A    Number of Children: N/A  . Years of Education: N/A   Social History Main Topics  . Smoking status: Never Smoker   . Smokeless tobacco: None  . Alcohol Use: None  . Drug Use: None  . Sexually Active: None   Other Topics Concern  . None   Social History Narrative  . None   History reviewed. No pertinent past surgical history. Past Medical History  Diagnosis Date  . Type II or unspecified type diabetes mellitus with  neurological manifestations, not stated as uncontrolled 08/08/2009  . DEPRESSION/ANXIETY 08/08/2009  . IBS 02/02/2010  . Polyneuropathy 12/04/2010   BP 142/83  Pulse 104  Resp 14  Ht 6' (1.829 m)  Wt 270 lb (122.471 kg)  BMI 36.62 kg/m2  SpO2 97%  LMP 09/21/2011     Review of Systems  Gastrointestinal: Positive for abdominal pain and diarrhea.  Musculoskeletal: Positive for myalgias, arthralgias and gait problem.  Neurological: Positive for dizziness, tremors, weakness and numbness.  Psychiatric/Behavioral: Positive for dysphoric mood. The patient is nervous/anxious.   All other systems reviewed and are negative.       Objective:   Physical Exam Constitutional: She is oriented to person, place, and time. She appears well-developed.  Neurological: She is alert and oriented to person, place, and time. She displays atrophy. A sensory deficit is present. She exhibits normal muscle tone. Gait abnormal.  Reflex Scores:  Patellar reflexes are 2+ on the right side and 2+ on the left side.  Achilles reflexes trace on the right side and on the left side. Lateral foot intrinsic atrophy. No tibialis anterior atrophy noted The patient has decreased sensation to pinprick, light touch in both feet, proprioception ok bilateral. Pulses are normal Right great toe is bandaged.  Skin: Skin is warm. No erythema.  Psychiatric: She has a normal mood and affect  Not able to heel or toe walk not able to tandem walk.  Assessment & Plan:  This is a 41 year old female with  1. Chronic pain syndrome  2. Polyneuropathy  3. Chronic foot ulcer, looks much better today, almost closed completely.  4. Diabetes  5. Anxiety, and depression  Plan :  Continue with medication, continue with exercising, advised patient to start Pilates with the DVD she owns, would like to order physical therapy for balance training, but the patient feels uncomfortable and anxious to go out of her house.  Follow up  in 1 month

## 2011-10-10 NOTE — Patient Instructions (Signed)
Continue with trying to exercises regulary.

## 2011-10-11 ENCOUNTER — Other Ambulatory Visit: Payer: Self-pay | Admitting: Family Medicine

## 2011-10-11 ENCOUNTER — Encounter: Payer: Self-pay | Admitting: *Deleted

## 2011-10-11 MED ORDER — ENALAPRIL MALEATE 20 MG PO TABS: 20.0000 mg | ORAL_TABLET | Freq: Every day | ORAL | Status: DC

## 2011-10-11 NOTE — Telephone Encounter (Signed)
Refilled enalapril

## 2011-10-11 NOTE — Addendum Note (Signed)
Addended by: Caryl Ada on: 10/11/2011 10:49 AM   Modules accepted: Orders

## 2011-10-11 NOTE — Telephone Encounter (Signed)
This encounter was created in error - please disregard.

## 2011-11-05 ENCOUNTER — Encounter (HOSPITAL_BASED_OUTPATIENT_CLINIC_OR_DEPARTMENT_OTHER): Payer: Medicaid Other

## 2011-11-09 ENCOUNTER — Encounter: Payer: Self-pay | Admitting: Physical Medicine and Rehabilitation

## 2011-11-09 ENCOUNTER — Encounter
Payer: Medicaid Other | Attending: Physical Medicine and Rehabilitation | Admitting: Physical Medicine and Rehabilitation

## 2011-11-09 VITALS — BP 125/66 | HR 94 | Resp 14 | Ht 72.0 in | Wt 267.0 lb

## 2011-11-09 DIAGNOSIS — G8929 Other chronic pain: Secondary | ICD-10-CM

## 2011-11-09 DIAGNOSIS — F3289 Other specified depressive episodes: Secondary | ICD-10-CM | POA: Insufficient documentation

## 2011-11-09 DIAGNOSIS — G629 Polyneuropathy, unspecified: Secondary | ICD-10-CM

## 2011-11-09 DIAGNOSIS — G609 Hereditary and idiopathic neuropathy, unspecified: Secondary | ICD-10-CM

## 2011-11-09 DIAGNOSIS — L97509 Non-pressure chronic ulcer of other part of unspecified foot with unspecified severity: Secondary | ICD-10-CM | POA: Insufficient documentation

## 2011-11-09 DIAGNOSIS — F329 Major depressive disorder, single episode, unspecified: Secondary | ICD-10-CM | POA: Insufficient documentation

## 2011-11-09 DIAGNOSIS — F411 Generalized anxiety disorder: Secondary | ICD-10-CM | POA: Insufficient documentation

## 2011-11-09 DIAGNOSIS — E1149 Type 2 diabetes mellitus with other diabetic neurological complication: Secondary | ICD-10-CM | POA: Insufficient documentation

## 2011-11-09 DIAGNOSIS — M79609 Pain in unspecified limb: Secondary | ICD-10-CM | POA: Insufficient documentation

## 2011-11-09 DIAGNOSIS — G5600 Carpal tunnel syndrome, unspecified upper limb: Secondary | ICD-10-CM

## 2011-11-09 DIAGNOSIS — G894 Chronic pain syndrome: Secondary | ICD-10-CM | POA: Insufficient documentation

## 2011-11-09 DIAGNOSIS — R209 Unspecified disturbances of skin sensation: Secondary | ICD-10-CM | POA: Insufficient documentation

## 2011-11-09 DIAGNOSIS — E1142 Type 2 diabetes mellitus with diabetic polyneuropathy: Secondary | ICD-10-CM | POA: Insufficient documentation

## 2011-11-09 MED ORDER — MORPHINE SULFATE ER 30 MG PO TBCR: 30.0000 mg | EXTENDED_RELEASE_TABLET | Freq: Three times a day (TID) | ORAL | Status: DC

## 2011-11-09 NOTE — Progress Notes (Signed)
Subjective:    Patient ID: [name], female    DOB: 1970-08-18, 41 y.o.   MRN: [id number]  HPI The patient complains about chronic hand and feet pain . The patient also complains about numbness and tingling in her hands and feet.  The problem has been stable.Except her carpal tunnel has gotten worse. Patient is in the middle of a move right now.  Pain Inventory Average Pain 4 Pain Right Now 7 My pain is intermittent, constant, sharp, burning, dull, stabbing, tingling and aching  In the last 24 hours, has pain interfered with the following? General activity 10 Relation with others 10 Enjoyment of life 10 What TIME of day is your pain at its worst? evening Sleep (in general) Poor  Pain is worse with: walking, standing and some activites Pain improves with: rest, heat/ice and medication Relief from Meds: 6  Mobility walk without assistance walk with assistance use a cane how many minutes can you walk? 15-20 ability to climb steps?  yes do you drive?  yes needs help with transfers transfers alone Do you have any goals in this area?  yes  Function disabled: date disabled 2005 I need assistance with the following:  bathing, meal prep, household duties and shopping Do you have any goals in this area?  yes  Neuro/Psych weakness numbness tremor tingling trouble walking spasms dizziness depression anxiety  Prior Studies Any changes since last visit?  no  Physicians involved in your care Any changes since last visit?  no   History reviewed. No pertinent family history. History   Social History  . Marital Status: Legally Separated    Spouse Name: N/A    Number of Children: N/A  . Years of Education: N/A   Social History Main Topics  . Smoking status: Never Smoker   . Smokeless tobacco: None  . Alcohol Use: None  . Drug Use: None  . Sexually Active: None   Other Topics Concern  . None   Social History Narrative  . None   Past Surgical  History  Procedure Date  . Cholecystectomy   . Abdominoplasty    Past Medical History  Diagnosis Date  . 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 DEPRESSION/ANXIETY 08/08/2009  . IBS 02/02/2010  . Polyneuropathy 12/04/2010   BP 125/66  Pulse 94  Resp 14  Ht 6' (1.829 m)  Wt 267 lb (121.11 kg)  BMI 36.21 kg/m2  SpO2 97%     Review of Systems  Musculoskeletal: Positive for myalgias and arthralgias.  Neurological: Positive for dizziness, tremors, weakness and numbness.  Psychiatric/Behavioral: Positive for dysphoric mood. The patient is nervous/anxious.   All other systems reviewed and are negative.       Objective:   Physical Exam Constitutional: She is oriented to person, place, and time. She appears well-developed.  Neurological: She is alert and oriented to person, place, and time. She displays atrophy. A sensory deficit is present. She exhibits normal muscle tone. Gait abnormal.  Reflex Scores:  Patellar reflexes are 2+ on the right side and 2+ on the left side.  Achilles reflexes trace on the right side and on the left side. Lateral foot intrinsic atrophy. No tibialis anterior atrophy noted The patient has decreased sensation to pinprick, light touch in both feet, proprioception ok bilateral. Pulses are normal Right great toe is bandaged.  Skin: Skin is warm. No erythema.  Psychiatric: She has a normal mood and affect  Not able to heel or toe walk not able to tandem walk.        Assessment & Plan:  This is a 41 year old female with  1. Chronic pain syndrome  2. Polyneuropathy  3. Chronic foot ulcer, looks much better today, almost closed completely.  4. Diabetes  5. Anxiety, and depression  Plan :  Continue with medication, continue with exercising, advised patient to start Pilates with the DVD she owns, would like to order physical therapy for balance training, but the patient  feels uncomfortable and anxious to go out of her house. Patient is in the middle of a move, and does not have much time to exercises right now. Patient would like to add Oxycodone for breakthrough pain, she wants to discuss this with Dr. Doroteo Bradford. Referral to hand surgery, for increased carpal tunnel symptoms. Follow up in 1 month

## 2011-11-09 NOTE — Patient Instructions (Signed)
Try to start exercising after you moved.

## 2011-11-16 ENCOUNTER — Other Ambulatory Visit: Payer: Self-pay | Admitting: *Deleted

## 2011-11-16 MED ORDER — CLONAZEPAM 2 MG PO TABS: 2.0000 mg | ORAL_TABLET | Freq: Every evening | ORAL | Status: DC | PRN

## 2011-12-07 ENCOUNTER — Encounter: Payer: Medicaid Other | Attending: Physical Medicine & Rehabilitation

## 2011-12-07 ENCOUNTER — Ambulatory Visit (HOSPITAL_BASED_OUTPATIENT_CLINIC_OR_DEPARTMENT_OTHER): Payer: Medicaid Other | Admitting: Physical Medicine & Rehabilitation

## 2011-12-07 ENCOUNTER — Encounter: Payer: Self-pay | Admitting: Physical Medicine & Rehabilitation

## 2011-12-07 VITALS — BP 144/83 | HR 96 | Resp 14 | Ht 72.0 in | Wt 267.0 lb

## 2011-12-07 DIAGNOSIS — L97509 Non-pressure chronic ulcer of other part of unspecified foot with unspecified severity: Secondary | ICD-10-CM | POA: Insufficient documentation

## 2011-12-07 DIAGNOSIS — E119 Type 2 diabetes mellitus without complications: Secondary | ICD-10-CM | POA: Insufficient documentation

## 2011-12-07 DIAGNOSIS — R209 Unspecified disturbances of skin sensation: Secondary | ICD-10-CM | POA: Insufficient documentation

## 2011-12-07 DIAGNOSIS — R279 Unspecified lack of coordination: Secondary | ICD-10-CM | POA: Insufficient documentation

## 2011-12-07 DIAGNOSIS — M79609 Pain in unspecified limb: Secondary | ICD-10-CM | POA: Insufficient documentation

## 2011-12-07 DIAGNOSIS — F329 Major depressive disorder, single episode, unspecified: Secondary | ICD-10-CM | POA: Insufficient documentation

## 2011-12-07 DIAGNOSIS — F3289 Other specified depressive episodes: Secondary | ICD-10-CM | POA: Insufficient documentation

## 2011-12-07 DIAGNOSIS — G894 Chronic pain syndrome: Secondary | ICD-10-CM | POA: Insufficient documentation

## 2011-12-07 DIAGNOSIS — D1801 Hemangioma of skin and subcutaneous tissue: Secondary | ICD-10-CM

## 2011-12-07 DIAGNOSIS — F411 Generalized anxiety disorder: Secondary | ICD-10-CM | POA: Insufficient documentation

## 2011-12-07 DIAGNOSIS — G609 Hereditary and idiopathic neuropathy, unspecified: Secondary | ICD-10-CM | POA: Insufficient documentation

## 2011-12-07 MED ORDER — MORPHINE SULFATE ER 30 MG PO TBCR: 30.0000 mg | EXTENDED_RELEASE_TABLET | Freq: Three times a day (TID) | ORAL | Status: DC

## 2011-12-07 MED ORDER — CLONAZEPAM 2 MG PO TABS: 2.0000 mg | ORAL_TABLET | Freq: Every evening | ORAL | Status: DC | PRN

## 2011-12-07 NOTE — Patient Instructions (Signed)
Cont current meds Vascular surgeon to evaluate leg lesions

## 2011-12-07 NOTE — Progress Notes (Signed)
Subjective:    Patient ID: [name], female    DOB: [birth year], 41 y.o.   MRN: [id number]  HPI No new changes in pain. Her great toe ulcer has healed. She is now in the process of moving to another apartment. She has a new pharmacy. Pain Inventory Average Pain 6 Pain Right Now 6 My pain is intermittent, sharp, burning, dull, stabbing, tingling and aching  In the last 24 hours, has pain interfered with the following? General activity 10 Relation with others 10 Enjoyment of life 10 What TIME of day is your pain at its worst? varies Sleep (in general) Fair  Pain is worse with: some activites Pain improves with: pacing activities Relief from Meds: 7  Mobility walk without assistance walk with assistance use a cane how many minutes can you walk? 10 ability to climb steps?  no do you drive?  no transfers alone Do you have any goals in this area?  yes  Function disabled: date disabled  I need assistance with the following:  bathing, meal prep, household duties and shopping Do you have any goals in this area?  yes  Neuro/Psych weakness numbness tremor tingling trouble walking spasms dizziness depression anxiety  Prior Studies Any changes since last visit?  no  Physicians involved in your care Any changes since last visit?  no   History reviewed. No pertinent family history. History   Social History  . Marital Status: Legally Separated    Spouse Name: N/A    Number of Children: N/A  . Years of Education: N/A   Social History Main Topics  . Smoking status: Never Smoker   . Smokeless tobacco: None  . Alcohol Use: None  . Drug Use: None  . Sexually Active: None   Other Topics Concern  . None   Social History Narrative  . None   Past Surgical History  Procedure Date  . Cholecystectomy   . Abdominoplasty    Past Medical History  Diagnosis Date  . 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  as uncontrolled 08/08/2009  . DEPRESSION/ANXIETY 08/08/2009  . IBS 02/02/2010  . Polyneuropathy 12/04/2010   BP 144/83  Pulse 96  Resp 14  Ht 6' (1.829 m)  Wt 267 lb (121.11 kg)  BMI 36.21 kg/m2  SpO2 97%     Review of Systems  Musculoskeletal: Positive for myalgias and arthralgias.  Neurological: Positive for weakness and numbness.  Psychiatric/Behavioral: Positive for dysphoric mood. The patient is nervous/anxious.   All other systems reviewed and are negative.       Objective:   Physical Exam  Constitutional: She is oriented to person, place, and time. She appears well-developed.  Neurological: She is alert and oriented to person, place, and time. She displays atrophy. A sensory deficit is present. She exhibits normal muscle tone. Gait abnormal.  Reflex Scores:  Patellar reflexes are 0 on the right side and 0 on the left side.  Achilles reflexes are 0 on the right side and 0 on the left side. Lateral foot intrinsic atrophy. No tibialis anterior atrophy noted The patient has decreased sensation to pinprick, light touch and proprioception in both feet. Pulses are normal Right great toe Has a plantar callus but no open areas.  Skin: Skin is warm. No erythema. There is a pretibial lesion that is nontender and soft to palpation approximately 4 cm long 1 cm wide and raised 1/2-1 cm. There is no evidence of skin discoloration. Psychiatric: She has  a normal mood and affect      Assessment & Plan:  1. Severe diabetic polyneuropathy. It is interesting in that her diabetes has been well managed but her neuropathy has been progressive. We've looked for other etiologies and has [facility] without any additional diagnoses.  She has not done well with the typical medications for neuropathic pain. She's done the best with morphine thus far. She is sleeping better with Klonopin 2 mg each bedtime. No signs of them are drug behavior No signs of sedation or  other side effects. PA visit in one month

## 2011-12-19 ENCOUNTER — Telehealth: Payer: Self-pay | Admitting: *Deleted

## 2011-12-19 MED ORDER — CLONAZEPAM 2 MG PO TABS: 2.0000 mg | ORAL_TABLET | Freq: Every evening | ORAL | Status: DC | PRN

## 2011-12-19 NOTE — Telephone Encounter (Signed)
Refill klonopin. [facility]. Medication was refilled but could not reach [facility] because no answer and no VM set up on her new home #

## 2011-12-25 ENCOUNTER — Encounter: Payer: Self-pay | Admitting: Vascular Surgery

## 2011-12-26 ENCOUNTER — Encounter: Payer: Medicaid Other | Admitting: Vascular Surgery

## 2012-01-01 ENCOUNTER — Ambulatory Visit: Payer: Medicaid Other | Admitting: Physical Medicine & Rehabilitation

## 2012-01-02 ENCOUNTER — Encounter: Payer: Self-pay | Admitting: Physical Medicine and Rehabilitation

## 2012-01-02 ENCOUNTER — Encounter
Payer: Medicaid Other | Attending: Physical Medicine and Rehabilitation | Admitting: Physical Medicine and Rehabilitation

## 2012-01-02 VITALS — BP 124/76 | HR 90 | Resp 14 | Ht 72.0 in | Wt 270.6 lb

## 2012-01-02 DIAGNOSIS — G8929 Other chronic pain: Secondary | ICD-10-CM

## 2012-01-02 DIAGNOSIS — F3289 Other specified depressive episodes: Secondary | ICD-10-CM | POA: Insufficient documentation

## 2012-01-02 DIAGNOSIS — F411 Generalized anxiety disorder: Secondary | ICD-10-CM | POA: Insufficient documentation

## 2012-01-02 DIAGNOSIS — G894 Chronic pain syndrome: Secondary | ICD-10-CM | POA: Insufficient documentation

## 2012-01-02 DIAGNOSIS — M79609 Pain in unspecified limb: Secondary | ICD-10-CM | POA: Insufficient documentation

## 2012-01-02 DIAGNOSIS — L97509 Non-pressure chronic ulcer of other part of unspecified foot with unspecified severity: Secondary | ICD-10-CM | POA: Insufficient documentation

## 2012-01-02 DIAGNOSIS — G629 Polyneuropathy, unspecified: Secondary | ICD-10-CM

## 2012-01-02 DIAGNOSIS — E119 Type 2 diabetes mellitus without complications: Secondary | ICD-10-CM | POA: Insufficient documentation

## 2012-01-02 DIAGNOSIS — F329 Major depressive disorder, single episode, unspecified: Secondary | ICD-10-CM | POA: Insufficient documentation

## 2012-01-02 DIAGNOSIS — G609 Hereditary and idiopathic neuropathy, unspecified: Secondary | ICD-10-CM

## 2012-01-02 MED ORDER — CLONAZEPAM 2 MG PO TABS: 2.0000 mg | ORAL_TABLET | Freq: Every evening | ORAL | Status: DC | PRN

## 2012-01-02 MED ORDER — MORPHINE SULFATE ER 30 MG PO TBCR: 30.0000 mg | EXTENDED_RELEASE_TABLET | Freq: Three times a day (TID) | ORAL | Status: DC

## 2012-01-02 NOTE — Progress Notes (Signed)
Subjective:    Patient ID: [name], female    DOB: 1970/08/25, 41 y.o.   MRN: [id number]  HPI The patient complains about chronic hand and feet pain . The patient also complains about numbness and tingling in her hands and feet.  The problem has been stable.Except her carpal tunnel has gotten worse referred her to a hand surgeon 2 month ago, patient has not followed up. Patient is still in the middle of a move right now.  Pain Inventory Average Pain 6 Pain Right Now 4 My pain is constant, sharp, burning, dull, stabbing, tingling and aching  In the last 24 hours, has pain interfered with the following? General activity 10 Relation with others 10 Enjoyment of life 10 What TIME of day is your pain at its worst? evening& night  Sleep (in general) Poor  Pain is worse with: walking, standing and some activites Pain improves with: medication Relief from Meds: 7  Mobility walk without assistance how many minutes can you walk? 15 ability to climb steps?  no do you drive?  no  Function disabled: date disabled 2004 I need assistance with the following:  bathing, meal prep, household duties and shopping  Neuro/Psych weakness numbness tremor tingling trouble walking spasms dizziness confusion depression anxiety  Prior Studies Any changes since last visit?  no  Physicians involved in your care Any changes since last visit?  no   History reviewed. No pertinent family history. History   Social History  . Marital Status: Legally Separated    Spouse Name: N/A    Number of Children: N/A  . Years of Education: N/A   Social History Main Topics  . Smoking status: Never Smoker   . Smokeless tobacco: Never Used  . Alcohol Use: None  . Drug Use: None  . Sexually Active: None   Other Topics Concern  . None   Social History Narrative  . None   Past Surgical History  Procedure Date  . Cholecystectomy   . Abdominoplasty    Past Medical History    Diagnosis Date  . 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) 08/08/2009  . DEPRESSION/ANXIETY 08/08/2009  . IBS 02/02/2010  . Polyneuropathy 12/04/2010   BP 124/76  Pulse 90  Resp 14  Ht 6' (1.829 m)  Wt 270 lb 9.6 oz (122.743 kg)  BMI 36.70 kg/m2  SpO2 97%    Review of Systems  Gastrointestinal: Positive for nausea, abdominal pain and diarrhea.  Musculoskeletal:       Arm and leg pain  Neurological: Positive for dizziness, tremors, weakness and numbness.       Tingling  Psychiatric/Behavioral: Positive for confusion and dysphoric mood. The patient is nervous/anxious.   All other systems reviewed and are negative.       Objective:   Physical Exam Constitutional: She is oriented to person, place, and time. She appears well-developed.  Neurological: She is alert and oriented to person, place, and time. She displays atrophy. A sensory deficit is present. She exhibits normal muscle tone and strength grossly 5/5. Gait abnormal.  Reflex Scores:  Patellar reflexes are 2+ on the right side and 2+ on the left side.  Achilles reflexes trace on the right side and on the left side. Lateral foot intrinsic atrophy. No tibialis anterior atrophy noted The patient has decreased sensation to pinprick, light touch in both feet, proprioception ok bilateral. Pulses are normal Right great toe sore has healed. Skin: Skin is warm.  No erythema.  Psychiatric: She has a normal mood and affect  Not able to heel or toe walk not able to tandem walk.        Assessment & Plan:  This is a 41 year old female with  1. Chronic pain syndrome  2. Polyneuropathy  3. Chronic foot ulcer, looks much better today, almost closed completely.  4. Diabetes  5. Anxiety, and depression  Plan :  Continue with medication, continue with exercising, advised patient to start Pilates with the DVD she owns, would like to order physical therapy for  balance training, but the patient feels uncomfortable and anxious to go out of her house. Patient is in the middle of a move, and does not have much time to exercises right now, but she states, that she has walked some, I encouraged her to continue with her walking and try to do this regularly.    Referral to hand surgery, for increased carpal tunnel symptoms 2 month ago, patient has not followed up on this.  Follow up in 1 month

## 2012-01-02 NOTE — Patient Instructions (Signed)
Try to be more active, with walking and doing exercises.

## 2012-01-29 ENCOUNTER — Ambulatory Visit: Payer: Medicaid Other | Admitting: Physical Medicine & Rehabilitation

## 2012-02-05 ENCOUNTER — Encounter
Payer: Medicaid Other | Attending: Physical Medicine and Rehabilitation | Admitting: Physical Medicine and Rehabilitation

## 2012-02-05 ENCOUNTER — Encounter: Payer: Self-pay | Admitting: Physical Medicine and Rehabilitation

## 2012-02-05 VITALS — BP 141/77 | HR 100 | Resp 14 | Ht 72.0 in | Wt 268.2 lb

## 2012-02-05 DIAGNOSIS — R209 Unspecified disturbances of skin sensation: Secondary | ICD-10-CM | POA: Insufficient documentation

## 2012-02-05 DIAGNOSIS — M79609 Pain in unspecified limb: Secondary | ICD-10-CM | POA: Insufficient documentation

## 2012-02-05 DIAGNOSIS — G629 Polyneuropathy, unspecified: Secondary | ICD-10-CM

## 2012-02-05 DIAGNOSIS — F411 Generalized anxiety disorder: Secondary | ICD-10-CM | POA: Insufficient documentation

## 2012-02-05 DIAGNOSIS — G894 Chronic pain syndrome: Secondary | ICD-10-CM | POA: Insufficient documentation

## 2012-02-05 DIAGNOSIS — G609 Hereditary and idiopathic neuropathy, unspecified: Secondary | ICD-10-CM | POA: Insufficient documentation

## 2012-02-05 DIAGNOSIS — F3289 Other specified depressive episodes: Secondary | ICD-10-CM | POA: Insufficient documentation

## 2012-02-05 DIAGNOSIS — E1149 Type 2 diabetes mellitus with other diabetic neurological complication: Secondary | ICD-10-CM | POA: Insufficient documentation

## 2012-02-05 DIAGNOSIS — G5600 Carpal tunnel syndrome, unspecified upper limb: Secondary | ICD-10-CM | POA: Insufficient documentation

## 2012-02-05 DIAGNOSIS — L97509 Non-pressure chronic ulcer of other part of unspecified foot with unspecified severity: Secondary | ICD-10-CM | POA: Insufficient documentation

## 2012-02-05 DIAGNOSIS — F329 Major depressive disorder, single episode, unspecified: Secondary | ICD-10-CM | POA: Insufficient documentation

## 2012-02-05 MED ORDER — MORPHINE SULFATE ER 30 MG PO TBCR: 30.0000 mg | EXTENDED_RELEASE_TABLET | Freq: Three times a day (TID) | ORAL | Status: DC

## 2012-02-05 NOTE — Patient Instructions (Signed)
Continue walking, try to be as active as tolerated.

## 2012-02-05 NOTE — Progress Notes (Signed)
Subjective:    Patient ID: [name], female    DOB: May 05, 1970, 41 y.o.   MRN: [id number]  HPI The patient complains about chronic hand and feet pain . The patient also complains about numbness and tingling in her hands and feet.  The problem has been stable.Except her carpal tunnel has gotten worse referred her to a hand surgeon 2 month ago, patient has not followed up. She has an appointment for a sleep study, and after this she has to have a colonoscopy.   Pain Inventory Average Pain 5 Pain Right Now 5 My pain is constant, sharp, burning, dull, stabbing, tingling and aching  In the last 24 hours, has pain interfered with the following? General activity 10 Relation with others 10 Enjoyment of life 10 What TIME of day is your pain at its worst? evening and night Sleep (in general) Poor  Pain is worse with: walking, standing and some activites Pain improves with: medication Relief from Meds: 8  Mobility walk without assistance use a cane how many minutes can you walk? 10 ability to climb steps?  no do you drive?  no  Function disabled: date disabled  I need assistance with the following:  bathing, meal prep, household duties and shopping  Neuro/Psych weakness numbness tingling trouble walking spasms dizziness depression anxiety  Prior Studies Any changes since last visit?  no  Physicians involved in your care Any changes since last visit?  no   History reviewed. No pertinent family history. History   Social History  . Marital Status: Legally Separated    Spouse Name: N/A    Number of Children: N/A  . Years of Education: N/A   Social History Main Topics  . Smoking status: Never Smoker   . Smokeless tobacco: Never Used  . Alcohol Use: None  . Drug Use: None  . Sexually Active: None   Other Topics Concern  . None   Social History Narrative  . None   Past Surgical History  Procedure Date  . Cholecystectomy   . Abdominoplasty     Past Medical History  Diagnosis Date  . 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) 08/08/2009  . DEPRESSION/ANXIETY 08/08/2009  . IBS 02/02/2010  . Polyneuropathy 12/04/2010   BP 141/77  Pulse 100  Resp 14  Ht 6' (1.829 m)  Wt 268 lb 3.2 oz (121.655 kg)  BMI 36.37 kg/m2  SpO2 97%   Review of Systems  Gastrointestinal: Positive for abdominal pain, diarrhea and constipation.  Musculoskeletal: Positive for gait problem.       Spasms  Neurological: Positive for dizziness, weakness and numbness.       Tingling  Psychiatric/Behavioral: Positive for dysphoric mood. The patient is nervous/anxious.   All other systems reviewed and are negative.       Objective:   Physical Exam Constitutional: She is oriented to person, place, and time. She appears well-developed.  Neurological: She is alert and oriented to person, place, and time. She displays atrophy. A sensory deficit is present. She exhibits normal muscle tone and strength grossly 5/5. Gait abnormal.  Reflex Scores:  Patellar reflexes are 2+ on the right side and 2+ on the left side.  Achilles reflexes trace on the right side and on the left side. Lateral foot intrinsic atrophy. No tibialis anterior atrophy noted The patient has decreased sensation to pinprick, light touch in both feet, proprioception ok bilateral. Pulses are normal Right great toe sore has healed.  Skin: Skin is warm. No erythema.  Psychiatric: She has a normal mood and affect  Not able to heel or toe walk not able to tandem walk.        Assessment & Plan:  This is a 41 year old female with  1. Chronic pain syndrome  2. Polyneuropathy  3. Chronic foot ulcer, looks much better today, almost closed completely.  4. Diabetes  5. Anxiety, and depression  Plan :  Continue with medication, continue with exercising, advised patient to start Pilates with the DVD she owns, would like to  order physical therapy for balance training, but the patient feels uncomfortable and anxious to go out of her house. Patient is in the middle of a move, and does not have much time to exercises right now, but she states, that she has walked some, I encouraged her to continue with her walking and try to do this regularly.  Referral to hand surgery, for increased carpal tunnel symptoms 2 month ago, patient has not followed up on this. She states, that she has to schedule an appointment for a sleep study and colonoscopy first, because of other issues. Follow up in 1 month

## 2012-02-13 ENCOUNTER — Encounter: Payer: Medicaid Other | Admitting: Family Medicine

## 2012-02-19 ENCOUNTER — Telehealth: Payer: Self-pay | Admitting: *Deleted

## 2012-02-19 MED ORDER — CLONAZEPAM 2 MG PO TABS: 2.0000 mg | ORAL_TABLET | Freq: Every evening | ORAL | Status: DC | PRN

## 2012-02-19 NOTE — Telephone Encounter (Signed)
[facility]  [place]  Notified Laylynn that refilled.

## 2012-02-21 ENCOUNTER — Other Ambulatory Visit: Payer: Self-pay | Admitting: Family Medicine

## 2012-03-05 ENCOUNTER — Encounter: Payer: Self-pay | Admitting: Physical Medicine and Rehabilitation

## 2012-03-05 ENCOUNTER — Encounter
Payer: Medicaid Other | Attending: Physical Medicine and Rehabilitation | Admitting: Physical Medicine and Rehabilitation

## 2012-03-05 ENCOUNTER — Other Ambulatory Visit: Payer: Self-pay | Admitting: Family Medicine

## 2012-03-05 VITALS — BP 146/70 | HR 114 | Resp 14 | Ht 70.0 in | Wt 269.0 lb

## 2012-03-05 DIAGNOSIS — M79609 Pain in unspecified limb: Secondary | ICD-10-CM | POA: Insufficient documentation

## 2012-03-05 DIAGNOSIS — G8929 Other chronic pain: Secondary | ICD-10-CM

## 2012-03-05 DIAGNOSIS — F3289 Other specified depressive episodes: Secondary | ICD-10-CM | POA: Insufficient documentation

## 2012-03-05 DIAGNOSIS — F411 Generalized anxiety disorder: Secondary | ICD-10-CM | POA: Insufficient documentation

## 2012-03-05 DIAGNOSIS — R209 Unspecified disturbances of skin sensation: Secondary | ICD-10-CM | POA: Insufficient documentation

## 2012-03-05 DIAGNOSIS — Z5181 Encounter for therapeutic drug level monitoring: Secondary | ICD-10-CM

## 2012-03-05 DIAGNOSIS — G609 Hereditary and idiopathic neuropathy, unspecified: Secondary | ICD-10-CM

## 2012-03-05 DIAGNOSIS — G629 Polyneuropathy, unspecified: Secondary | ICD-10-CM

## 2012-03-05 DIAGNOSIS — G894 Chronic pain syndrome: Secondary | ICD-10-CM | POA: Insufficient documentation

## 2012-03-05 DIAGNOSIS — L97509 Non-pressure chronic ulcer of other part of unspecified foot with unspecified severity: Secondary | ICD-10-CM | POA: Insufficient documentation

## 2012-03-05 DIAGNOSIS — E119 Type 2 diabetes mellitus without complications: Secondary | ICD-10-CM | POA: Insufficient documentation

## 2012-03-05 DIAGNOSIS — F329 Major depressive disorder, single episode, unspecified: Secondary | ICD-10-CM | POA: Insufficient documentation

## 2012-03-05 MED ORDER — MORPHINE SULFATE ER 30 MG PO TBCR: 30.0000 mg | EXTENDED_RELEASE_TABLET | Freq: Three times a day (TID) | ORAL | Status: DC

## 2012-03-05 NOTE — Patient Instructions (Signed)
Try to stay active, continue with your walking regularly.

## 2012-03-05 NOTE — Progress Notes (Signed)
Subjective:    Patient ID: [name], female    DOB: [birth year], 41 y.o.   MRN: [id number]  HPI The patient complains about chronic hand and feet pain . The patient also complains about numbness and tingling in her hands and feet.  The problem has been stable.Except her carpal tunnel has gotten worse referred her to a hand surgeon 2 month ago, patient has not followed up. She has an appointment for a sleep study, and after this she has to have a colonoscopy, she did not go to her appointment and did not reschedule.   Pain Inventory Average Pain 10 Pain Right Now 10 My pain is constant, sharp, burning, dull, stabbing, tingling and aching  In the last 24 hours, has pain interfered with the following? General activity 10 Relation with others 10 Enjoyment of life 10 What TIME of day is your pain at its worst? night Sleep (in general) Fair  Pain is worse with: walking, bending, sitting, standing and some activites Pain improves with: rest and medication Relief from Meds: 7  Mobility walk without assistance how many minutes can you walk? 10 ability to climb steps?  no do you drive?  no  Function disabled: date disabled  I need assistance with the following:  bathing, meal prep, household duties and shopping  Neuro/Psych weakness numbness tingling trouble walking dizziness depression anxiety  Prior Studies Any changes since last visit?  no  Physicians involved in your care Any changes since last visit?  no   History reviewed. No pertinent family history. History   Social History  . Marital Status: Legally Separated    Spouse Name: N/A    Number of Children: N/A  . Years of Education: N/A   Social History Main Topics  . Smoking status: Never Smoker   . Smokeless tobacco: Never Used  . Alcohol Use: None  . Drug Use: None  . Sexually Active: None   Other Topics Concern  . None   Social History Narrative  . None   Past Surgical History   Procedure Date  . Cholecystectomy   . Abdominoplasty    Past Medical History  Diagnosis Date  . 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) 08/08/2009  . DEPRESSION/ANXIETY 08/08/2009  . IBS 02/02/2010  . Polyneuropathy 12/04/2010   BP 146/70  Pulse 114  Resp 14  Ht 5\' 10"  (1.778 m)  Wt 269 lb (122.018 kg)  BMI 38.60 kg/m2  SpO2 98%   Review of Systems  Musculoskeletal: Positive for gait problem.       Spasms  Neurological: Positive for weakness and numbness.       Tingling  Psychiatric/Behavioral: Positive for dysphoric mood. The patient is nervous/anxious.   All other systems reviewed and are negative.       Objective:   Physical Exam Constitutional: She is oriented to person, place, and time. She appears well-developed.  Neurological: She is alert and oriented to person, place, and time. She displays atrophy. A sensory deficit is present. She exhibits normal muscle tone and strength grossly 5/5. Gait abnormal.  Reflex Scores:  Patellar reflexes are 2+ on the right side and 2+ on the left side.  Achilles reflexes trace on the right side and on the left side. Lateral foot intrinsic atrophy. No tibialis anterior atrophy noted The patient has decreased sensation to pinprick, light touch in both feet, proprioception ok bilateral. Pulses are normal Right great toe sore has healed.  Skin:  Skin is warm. No erythema.  Psychiatric: She has a normal mood and affect  Not able to heel or toe walk not able to tandem walk.        Assessment & Plan:  This is a 41 year old female with  1. Chronic pain syndrome  2. Polyneuropathy  3. Chronic foot ulcer, looks much better today, almost closed completely.  4. Diabetes  5. Anxiety, and depression  Plan :  Continue with medication, continue with exercising, advised patient to start Pilates with the DVD she owns, would like to order physical therapy for  balance training, but the patient feels uncomfortable and anxious to go out of her house. Patient is in the middle of a move, and does not have much time to exercises right now, but she states, that she has walked some, I encouraged her to continue with her walking and try to do this regularly.  Referral to hand surgery, for increased carpal tunnel symptoms 2 month ago, patient has not followed up on this. She states, that she has to schedule an appointment for a sleep study and colonoscopy first, because of other issues, she did not reschedule those appointments. Discussed that she should see a psychologist, to help with her depression and being unmotivated. Follow up in 1 month

## 2012-03-20 ENCOUNTER — Telehealth: Payer: Self-pay | Admitting: *Deleted

## 2012-03-20 MED ORDER — CLONAZEPAM 2 MG PO TABS: 2.0000 mg | ORAL_TABLET | Freq: Every evening | ORAL | Status: DC | PRN

## 2012-03-20 NOTE — Telephone Encounter (Signed)
Refill on Klonopin. Refilled and Verlon Au notified.

## 2012-04-03 ENCOUNTER — Telehealth: Payer: Self-pay | Admitting: *Deleted

## 2012-04-03 ENCOUNTER — Encounter: Payer: Medicaid Other | Admitting: Physical Medicine and Rehabilitation

## 2012-04-03 MED ORDER — MORPHINE SULFATE ER 30 MG PO TBCR: 30.0000 mg | EXTENDED_RELEASE_TABLET | Freq: Three times a day (TID) | ORAL | Status: DC

## 2012-04-03 NOTE — Telephone Encounter (Signed)
Printed for Karen to sign 

## 2012-04-03 NOTE — Telephone Encounter (Signed)
Patient needs prescription for Morphine. Has an appointment on Monday to see Clydie Braun. Will run out of medicine before then.

## 2012-04-03 NOTE — Telephone Encounter (Signed)
Notified of prescription ready

## 2012-04-07 ENCOUNTER — Encounter: Payer: Self-pay | Admitting: Physical Medicine and Rehabilitation

## 2012-04-07 ENCOUNTER — Encounter
Payer: Medicaid Other | Attending: Physical Medicine and Rehabilitation | Admitting: Physical Medicine and Rehabilitation

## 2012-04-07 VITALS — BP 136/78 | HR 102 | Resp 16 | Ht 70.0 in | Wt 271.0 lb

## 2012-04-07 DIAGNOSIS — G8929 Other chronic pain: Secondary | ICD-10-CM

## 2012-04-07 DIAGNOSIS — L97509 Non-pressure chronic ulcer of other part of unspecified foot with unspecified severity: Secondary | ICD-10-CM | POA: Insufficient documentation

## 2012-04-07 DIAGNOSIS — G629 Polyneuropathy, unspecified: Secondary | ICD-10-CM

## 2012-04-07 DIAGNOSIS — F329 Major depressive disorder, single episode, unspecified: Secondary | ICD-10-CM | POA: Insufficient documentation

## 2012-04-07 DIAGNOSIS — E119 Type 2 diabetes mellitus without complications: Secondary | ICD-10-CM | POA: Insufficient documentation

## 2012-04-07 DIAGNOSIS — R229 Localized swelling, mass and lump, unspecified: Secondary | ICD-10-CM | POA: Insufficient documentation

## 2012-04-07 DIAGNOSIS — G894 Chronic pain syndrome: Secondary | ICD-10-CM | POA: Insufficient documentation

## 2012-04-07 DIAGNOSIS — G609 Hereditary and idiopathic neuropathy, unspecified: Secondary | ICD-10-CM

## 2012-04-07 DIAGNOSIS — F3289 Other specified depressive episodes: Secondary | ICD-10-CM | POA: Insufficient documentation

## 2012-04-07 DIAGNOSIS — F411 Generalized anxiety disorder: Secondary | ICD-10-CM | POA: Insufficient documentation

## 2012-04-07 NOTE — Patient Instructions (Signed)
Try to walk more regularly.

## 2012-04-07 NOTE — Progress Notes (Signed)
Subjective:    Patient ID: [name], female    DOB: [birth year], 42 y.o.   MRN: [id number]  HPI The patient complains about chronic hand and feet pain . The patient also complains about numbness and tingling in her hands and feet.  The problem has been stable.Except her carpal tunnel has gotten worse referred her to a hand surgeon 2 month ago, patient has not followed up. She has an appointment for a sleep study, and after this she has to have a colonoscopy, she did not go to her appointment and did not reschedule. The patient complains about a little "bump" in her right medial thigh.  Pain Inventory Average Pain 4 Pain Right Now 4 My pain is sharp, burning, dull, stabbing, tingling and aching  In the last 24 hours, has pain interfered with the following? General activity 10 Relation with others 10 Enjoyment of life 10 What TIME of day is your pain at its worst? evening Sleep (in general) Fair  Pain is worse with: walking, standing and some activites Pain improves with: rest and medication Relief from Meds: 7  Mobility Do you have any goals in this area?  yes  Function disabled: date disabled  I need assistance with the following:  toileting, household duties and shopping Do you have any goals in this area?  yes  Neuro/Psych weakness numbness tremor tingling trouble walking spasms dizziness depression anxiety  Prior Studies Any changes since last visit?  no  Physicians involved in your care Any changes since last visit?  no   History reviewed. No pertinent family history. History   Social History  . Marital Status: Legally Separated    Spouse Name: N/A    Number of Children: N/A  . Years of Education: N/A   Social History Main Topics  . Smoking status: Never Smoker   . Smokeless tobacco: Never Used  . Alcohol Use: None  . Drug Use: None  . Sexually Active: None   Other Topics Concern  . None   Social History Narrative  . None   Past  Surgical History  Procedure Date  . Cholecystectomy   . Abdominoplasty    Past Medical History  Diagnosis Date  . 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) 08/08/2009  . DEPRESSION/ANXIETY 08/08/2009  . IBS 02/02/2010  . Polyneuropathy 12/04/2010   BP 136/78  Pulse 102  Resp 16  Ht 5\' 10"  (1.778 m)  Wt 271 lb (122.925 kg)  BMI 38.88 kg/m2  SpO2 98%  LMP 03/07/2012    Review of Systems  Musculoskeletal: Positive for myalgias, arthralgias and gait problem.  Neurological: Positive for dizziness, tremors, weakness and numbness.       Tingling, spasms  Psychiatric/Behavioral: Positive for dysphoric mood. The patient is nervous/anxious.   All other systems reviewed and are negative.       Objective:   Physical Exam Constitutional: She is oriented to person, place, and time. She appears well-developed.  Neurological: She is alert and oriented to person, place, and time. She displays atrophy. A sensory deficit is present. She exhibits normal muscle tone and strength grossly 5/5. Gait abnormal.  Reflex Scores:  Patellar reflexes are 2+ on the right side and 2+ on the left side.  Achilles reflexes trace on the right side and on the left side. Lateral foot intrinsic atrophy. No tibialis anterior atrophy noted The patient has decreased sensation to pinprick, light touch in both feet, proprioception ok bilateral. Pulses  are normal Right great toe sore has healed.  Skin: Skin is warm. No erythema.  Psychiatric: She has a normal mood and affect  Not able to heel or toe walk not able to tandem walk. Soft small mass in right upper medial thigh,( patient has several small "bumps" on her legs)        Assessment & Plan:  This is a 42 year old female with  1. Chronic pain syndrome  2. Polyneuropathy  3. Chronic foot ulcer, looks much better today, almost closed completely.  4. Diabetes  5. Anxiety, and  depression  6. Soft mass on right medial upper thigh, most likely lipoma, advised patient to follow up on this with her PCP next Wednesday at her appointment, he might refer her to general surgery. Plan :  Continue with medication, continue with exercising, advised patient to start Pilates with the DVD she owns, would like to order physical therapy for balance training, but the patient feels uncomfortable and anxious to go out of her house. I encouraged her to continue with her walking and try to do this regularly.  Referral to hand surgery, for increased carpal tunnel symptoms 2 month ago, patient has not followed up on this. She states, that she has to reschedule an appointment for a sleep study and colonoscopy first, because of other issues, she did not reschedule those appointments. Discussed that she should see a psychologist, to help with her depression and being unmotivated.  Medication was refilled last week Follow up in 1 month

## 2012-04-09 ENCOUNTER — Ambulatory Visit: Payer: Medicaid Other | Admitting: Family Medicine

## 2012-04-12 ENCOUNTER — Other Ambulatory Visit: Payer: Self-pay | Admitting: Family Medicine

## 2012-05-05 ENCOUNTER — Encounter: Payer: Medicaid Other | Attending: Physical Medicine and Rehabilitation

## 2012-05-05 ENCOUNTER — Encounter: Payer: Self-pay | Admitting: Physical Medicine & Rehabilitation

## 2012-05-05 ENCOUNTER — Ambulatory Visit (HOSPITAL_BASED_OUTPATIENT_CLINIC_OR_DEPARTMENT_OTHER): Payer: Medicaid Other | Admitting: Physical Medicine & Rehabilitation

## 2012-05-05 VITALS — BP 142/79 | HR 91 | Resp 14 | Ht 72.0 in | Wt 273.6 lb

## 2012-05-05 DIAGNOSIS — E1149 Type 2 diabetes mellitus with other diabetic neurological complication: Secondary | ICD-10-CM

## 2012-05-05 DIAGNOSIS — E1142 Type 2 diabetes mellitus with diabetic polyneuropathy: Secondary | ICD-10-CM

## 2012-05-05 DIAGNOSIS — F411 Generalized anxiety disorder: Secondary | ICD-10-CM | POA: Insufficient documentation

## 2012-05-05 DIAGNOSIS — G894 Chronic pain syndrome: Secondary | ICD-10-CM | POA: Insufficient documentation

## 2012-05-05 DIAGNOSIS — F329 Major depressive disorder, single episode, unspecified: Secondary | ICD-10-CM | POA: Insufficient documentation

## 2012-05-05 DIAGNOSIS — M79609 Pain in unspecified limb: Secondary | ICD-10-CM | POA: Insufficient documentation

## 2012-05-05 DIAGNOSIS — G609 Hereditary and idiopathic neuropathy, unspecified: Secondary | ICD-10-CM | POA: Insufficient documentation

## 2012-05-05 DIAGNOSIS — F3289 Other specified depressive episodes: Secondary | ICD-10-CM | POA: Insufficient documentation

## 2012-05-05 DIAGNOSIS — E1140 Type 2 diabetes mellitus with diabetic neuropathy, unspecified: Secondary | ICD-10-CM

## 2012-05-05 DIAGNOSIS — G5600 Carpal tunnel syndrome, unspecified upper limb: Secondary | ICD-10-CM | POA: Insufficient documentation

## 2012-05-05 DIAGNOSIS — R209 Unspecified disturbances of skin sensation: Secondary | ICD-10-CM | POA: Insufficient documentation

## 2012-05-05 DIAGNOSIS — L97509 Non-pressure chronic ulcer of other part of unspecified foot with unspecified severity: Secondary | ICD-10-CM | POA: Insufficient documentation

## 2012-05-05 MED ORDER — MORPHINE SULFATE ER 30 MG PO TBCR: 30.0000 mg | EXTENDED_RELEASE_TABLET | Freq: Three times a day (TID) | ORAL | Status: DC

## 2012-05-05 NOTE — Patient Instructions (Signed)
PT for balance exercise and equipment

## 2012-05-05 NOTE — Progress Notes (Signed)
Subjective:    Patient ID: [name], female    DOB: 11-19-1970, 42 y.o.   MRN: [id number]  HPI Increasing problems with balance.  Fearful in the bathroom Still with burning pain in feet only partially relieved with morphine Has moderately severe depression , discussed taper off paxil (takes for OCD) and trial of cymbalta.  Pt is reluctant to do this Pain Inventory Average Pain 7 Pain Right Now 7 My pain is constant, sharp, burning, dull, stabbing, tingling and aching  In the last 24 hours, has pain interfered with the following? General activity 10 Relation with others 10 Enjoyment of life 10 What TIME of day is your pain at its worst? evening Sleep (in general) Fair  Pain is worse with: walking, bending, standing and some activites Pain improves with: rest and medication Relief from Meds: 5  Mobility walk without assistance how many minutes can you walk? 5-10 ability to climb steps?  no do you drive?  no  Function disabled: date disabled see chart I need assistance with the following:  bathing and household duties  Neuro/Psych weakness numbness tremor tingling trouble walking spasms dizziness confusion depression anxiety  Prior Studies Any changes since last visit?  no  Physicians involved in your care Any changes since last visit?  no   History reviewed. No pertinent family history. History   Social History  . Marital Status: Legally Separated    Spouse Name: N/A    Number of Children: N/A  . Years of Education: N/A   Social History Main Topics  . Smoking status: Never Smoker   . Smokeless tobacco: Never Used  . Alcohol Use: None  . Drug Use: None  . Sexually Active: None   Other Topics Concern  . None   Social History Narrative  . None   Past Surgical History  Procedure Laterality Date  . Cholecystectomy    . Abdominoplasty     Past Medical History  Diagnosis Date  . Type II or unspecified type diabetes mellitus with  neurological manifestations, 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) 08/08/2009  . DEPRESSION/ANXIETY 08/08/2009  . IBS 02/02/2010  . Polyneuropathy 12/04/2010   BP 142/79  Pulse 91  Resp 14  Ht 6' (1.829 m)  Wt 273 lb 9.6 oz (124.104 kg)  BMI 37.1 kg/m2  SpO2 97%  LMP 03/07/2012    Review of Systems  Musculoskeletal: Positive for gait problem.       Spasms  Neurological: Positive for tremors, weakness and numbness.       Tingling  Psychiatric/Behavioral: Positive for confusion and dysphoric mood. The patient is nervous/anxious.   All other systems reviewed and are negative.       Objective:   Physical Exam  Constitutional: She is oriented to person, place, and time. She appears well-developed.  Neurological: She is alert and oriented to person, place, and time. She displays atrophy. A sensory deficit is present. She exhibits normal muscle tone. Gait abnormal.  Reflex Scores:  Patellar reflexes are 0 on the right side and 0 on the left side.  Achilles reflexes are 0 on the right side and 0 on the left side. Lateral foot intrinsic atrophy. No tibialis anterior atrophy noted The patient has decreased sensation to pinprick, light touch and proprioception in both feet. Pulses are normal Right great toe Has a plantar callus but no open areas.  Skin: Skin is warm. No erythema. There are multiple pretibial lesions  that is nontender and soft to palpation approximately There is no  evidence of skin discoloration. Psychiatric: She has a normal mood and affect       Assessment & Plan:  1. Severe diabetic polyneuropathy.Declining balanceIt is interesting in that her diabetes has been well managed but her neuropathy has been progressive. We've looked for other etiologies and has [facility] without any additional diagnoses.  She has not done well with the typical medications for neuropathic pain. She's done the best with morphine thus far. She is sleeping  better with Klonopin 2 mg each bedtime.  Will send to PT to eval for adaptive equipment, tub bench cane etc Discussed SCS, DVD given No signs of them are drug behavior  No signs of sedation or other side effects.  PA visit in one month

## 2012-05-06 ENCOUNTER — Other Ambulatory Visit: Payer: Self-pay | Admitting: Family Medicine

## 2012-05-08 ENCOUNTER — Ambulatory Visit: Payer: Medicaid Other | Admitting: Physical Medicine and Rehabilitation

## 2012-05-22 ENCOUNTER — Telehealth: Payer: Self-pay | Admitting: Family Medicine

## 2012-05-22 NOTE — Telephone Encounter (Signed)
Pt is needing test strips & lancets for her meter - she has an appt 3/13 She has not had to get refill in a long time  Accucheck [facility] [address]

## 2012-05-23 MED ORDER — GLUCOSE BLOOD VI STRP: ORAL_STRIP | Status: DC

## 2012-05-23 MED ORDER — ACCU-CHEK MULTICLIX LANCETS MISC: Status: DC

## 2012-05-23 NOTE — Telephone Encounter (Signed)
Rx sent to pharmacy   

## 2012-05-25 ENCOUNTER — Other Ambulatory Visit: Payer: Self-pay | Admitting: Family Medicine

## 2012-05-26 ENCOUNTER — Telehealth: Payer: Self-pay

## 2012-05-26 MED ORDER — CLONAZEPAM 2 MG PO TABS: 2.0000 mg | ORAL_TABLET | Freq: Every evening | ORAL | Status: DC | PRN

## 2012-05-26 NOTE — Telephone Encounter (Signed)
Patient called for klonopin refill.  Refill was called into pharmacy.  Patient aware.

## 2012-05-29 ENCOUNTER — Encounter: Payer: Self-pay | Admitting: Physical Medicine and Rehabilitation

## 2012-05-29 ENCOUNTER — Encounter
Payer: Medicaid Other | Attending: Physical Medicine and Rehabilitation | Admitting: Physical Medicine and Rehabilitation

## 2012-05-29 VITALS — BP 135/84 | HR 100 | Resp 14 | Ht 72.0 in | Wt 273.0 lb

## 2012-05-29 DIAGNOSIS — E1142 Type 2 diabetes mellitus with diabetic polyneuropathy: Secondary | ICD-10-CM

## 2012-05-29 DIAGNOSIS — G5600 Carpal tunnel syndrome, unspecified upper limb: Secondary | ICD-10-CM | POA: Insufficient documentation

## 2012-05-29 DIAGNOSIS — E119 Type 2 diabetes mellitus without complications: Secondary | ICD-10-CM | POA: Insufficient documentation

## 2012-05-29 DIAGNOSIS — F411 Generalized anxiety disorder: Secondary | ICD-10-CM | POA: Insufficient documentation

## 2012-05-29 DIAGNOSIS — R209 Unspecified disturbances of skin sensation: Secondary | ICD-10-CM | POA: Insufficient documentation

## 2012-05-29 DIAGNOSIS — E1149 Type 2 diabetes mellitus with other diabetic neurological complication: Secondary | ICD-10-CM | POA: Insufficient documentation

## 2012-05-29 DIAGNOSIS — G894 Chronic pain syndrome: Secondary | ICD-10-CM | POA: Insufficient documentation

## 2012-05-29 DIAGNOSIS — F329 Major depressive disorder, single episode, unspecified: Secondary | ICD-10-CM | POA: Insufficient documentation

## 2012-05-29 DIAGNOSIS — G609 Hereditary and idiopathic neuropathy, unspecified: Secondary | ICD-10-CM | POA: Insufficient documentation

## 2012-05-29 DIAGNOSIS — M79609 Pain in unspecified limb: Secondary | ICD-10-CM | POA: Insufficient documentation

## 2012-05-29 DIAGNOSIS — L97509 Non-pressure chronic ulcer of other part of unspecified foot with unspecified severity: Secondary | ICD-10-CM | POA: Insufficient documentation

## 2012-05-29 DIAGNOSIS — F3289 Other specified depressive episodes: Secondary | ICD-10-CM | POA: Insufficient documentation

## 2012-05-29 DIAGNOSIS — R229 Localized swelling, mass and lump, unspecified: Secondary | ICD-10-CM | POA: Insufficient documentation

## 2012-05-29 MED ORDER — MORPHINE SULFATE ER 30 MG PO TBCR: 30.0000 mg | EXTENDED_RELEASE_TABLET | Freq: Three times a day (TID) | ORAL | Status: DC

## 2012-05-29 NOTE — Progress Notes (Signed)
Subjective:    Patient ID: SINIYAH EVANGELIST, female    DOB: 1970/10/05, 42 y.o.   MRN: [id number]  HPI The patient complains about chronic hand and feet pain . The patient also complains about numbness and tingling in her hands and feet.  The problem has been stable.Except her carpal tunnel has gotten worse referred her to a hand surgeon 2 month ago, patient has not followed up. She has an appointment for a sleep study, and after this she has to have a colonoscopy, she did not go to her appointment and did not reschedule.  The patient reports that nobody from PT has called her, Dr. Doroteo Bradford ordered PT at her last visit.  Pain Inventory Average Pain 8 Pain Right Now 8 My pain is intermittent, constant, sharp, burning, dull, stabbing, tingling and aching  In the last 24 hours, has pain interfered with the following? General activity 10 Relation with others 10 Enjoyment of life 10 What TIME of day is your pain at its worst? evening Sleep (in general) Poor  Pain is worse with: walking, bending, inactivity and some activites Pain improves with: rest, therapy/exercise and medication Relief from Meds: 7  Mobility walk without assistance walk with assistance use a cane how many minutes can you walk? 5-10 ability to climb steps?  no do you drive?  no needs help with transfers Do you have any goals in this area?  yes  Function disabled: date disabled see chart I need assistance with the following:  bathing, household duties and shopping Do you have any goals in this area?  yes  Neuro/Psych weakness numbness tremor tingling trouble walking spasms dizziness depression anxiety  Prior Studies Any changes since last visit?  no  Physicians involved in your care Any changes since last visit?  no   History reviewed. No pertinent family history. History   Social History  . Marital Status: Legally Separated    Spouse Name: N/A    Number of Children: N/A  . Years of  Education: N/A   Social History Main Topics  . Smoking status: Never Smoker   . Smokeless tobacco: Never Used  . Alcohol Use: None  . Drug Use: None  . Sexually Active: None   Other Topics Concern  . None   Social History Narrative  . None   Past Surgical History  Procedure Laterality Date  . Cholecystectomy    . Abdominoplasty     Past Medical History  Diagnosis Date  . 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) 08/08/2009  . DEPRESSION/ANXIETY 08/08/2009  . IBS 02/02/2010  . Polyneuropathy 12/04/2010   BP 135/84  Pulse 100  Resp 14  Ht 6' (1.829 m)  Wt 273 lb (123.832 kg)  BMI 37.02 kg/m2  SpO2 97%  LMP 05/10/2012     Review of Systems  Musculoskeletal: Positive for myalgias, back pain, arthralgias and gait problem.  Neurological: Positive for tremors, weakness and numbness.  Psychiatric/Behavioral: Positive for dysphoric mood. The patient is nervous/anxious.   All other systems reviewed and are negative.       Objective:   Physical Exam Constitutional: She is oriented to person, place, and time. She appears well-developed.  Neurological: She is alert and oriented to person, place, and time. She displays atrophy. A sensory deficit is present. She exhibits normal muscle tone and strength grossly 5/5. Gait abnormal.  Reflex Scores:  Patellar reflexes are 2+ on the right side and 2+ on the left  side.  Achilles reflexes trace on the right side and on the left side. Lateral foot intrinsic atrophy. No tibialis anterior atrophy noted The patient has decreased sensation to pinprick, light touch in both feet, proprioception ok bilateral. Pulses are normal Right great toe sore has healed.  Skin: Skin is warm. No erythema.  Psychiatric: She has a normal mood and affect  Not able to heel or toe walk not able to tandem walk.  Soft small mass in right upper medial thigh,( patient has several small  "bumps" on her legs)          Assessment & Plan:  This is a 42 year old female with  1. Chronic pain syndrome  2. Polyneuropathy  3. Chronic foot ulcer, looks much better today, almost closed completely.  4. Diabetes  5. Anxiety, and depression  6. Soft mass on right medial upper thigh, most likely lipoma, advised patient to follow up on this with her PCP at her next appointment, he might refer her to general surgery.  Plan :  Continue with medication, continue with exercising, advised patient to start Pilates with the DVD she owns, physical therapy was ordered at her last visit, she states, that she did not get a call from them to make an appointment.  I encouraged her to continue with her walking and try to do this regularly.  Referral to hand surgery, for increased carpal tunnel symptoms 2 month ago, patient has not followed up on this. She states, that she has to reschedule an appointment for a sleep study and colonoscopy first, because of other issues, she did not reschedule those appointments. Discussed that she should see a psychologist, to help with her depression and being unmotivated. Patient has watched the SCS DVD, and is very interested to do the SCS trial. She has medicaid, I will ask Dr. Doroteo Bradford whether he knows to whom we could refer her, basically who would do this trial for a medicaid patient.  Medication refilled.  Follow up in 1 month

## 2012-05-29 NOTE — Patient Instructions (Signed)
Stay as active as pain permits 

## 2012-05-29 NOTE — Progress Notes (Signed)
Clydie Braun, I am starting to do trials in conjunction with Dr Ollen Bowl

## 2012-06-05 ENCOUNTER — Encounter: Payer: Medicaid Other | Admitting: Family Medicine

## 2012-06-25 ENCOUNTER — Other Ambulatory Visit: Payer: Self-pay | Admitting: Family Medicine

## 2012-06-25 NOTE — Telephone Encounter (Signed)
Pt informed and appt made for next Friday. Fleeger, [name]

## 2012-06-26 ENCOUNTER — Encounter
Payer: Medicaid Other | Attending: Physical Medicine and Rehabilitation | Admitting: Physical Medicine and Rehabilitation

## 2012-06-26 DIAGNOSIS — L97509 Non-pressure chronic ulcer of other part of unspecified foot with unspecified severity: Secondary | ICD-10-CM | POA: Insufficient documentation

## 2012-06-26 DIAGNOSIS — G894 Chronic pain syndrome: Secondary | ICD-10-CM | POA: Insufficient documentation

## 2012-06-26 DIAGNOSIS — F329 Major depressive disorder, single episode, unspecified: Secondary | ICD-10-CM | POA: Insufficient documentation

## 2012-06-26 DIAGNOSIS — G609 Hereditary and idiopathic neuropathy, unspecified: Secondary | ICD-10-CM | POA: Insufficient documentation

## 2012-06-26 DIAGNOSIS — E119 Type 2 diabetes mellitus without complications: Secondary | ICD-10-CM | POA: Insufficient documentation

## 2012-06-26 DIAGNOSIS — F411 Generalized anxiety disorder: Secondary | ICD-10-CM | POA: Insufficient documentation

## 2012-06-26 DIAGNOSIS — R229 Localized swelling, mass and lump, unspecified: Secondary | ICD-10-CM | POA: Insufficient documentation

## 2012-06-26 DIAGNOSIS — F3289 Other specified depressive episodes: Secondary | ICD-10-CM | POA: Insufficient documentation

## 2012-07-01 ENCOUNTER — Encounter: Payer: Self-pay | Admitting: Physical Medicine and Rehabilitation

## 2012-07-01 ENCOUNTER — Encounter (HOSPITAL_BASED_OUTPATIENT_CLINIC_OR_DEPARTMENT_OTHER): Payer: Medicaid Other | Admitting: Physical Medicine and Rehabilitation

## 2012-07-01 VITALS — BP 130/73 | HR 105 | Resp 16 | Ht 72.0 in | Wt 272.0 lb

## 2012-07-01 DIAGNOSIS — G894 Chronic pain syndrome: Secondary | ICD-10-CM

## 2012-07-01 DIAGNOSIS — G629 Polyneuropathy, unspecified: Secondary | ICD-10-CM

## 2012-07-01 DIAGNOSIS — G609 Hereditary and idiopathic neuropathy, unspecified: Secondary | ICD-10-CM

## 2012-07-01 MED ORDER — MORPHINE SULFATE ER 30 MG PO TBCR: 30.0000 mg | EXTENDED_RELEASE_TABLET | Freq: Three times a day (TID) | ORAL | Status: DC

## 2012-07-01 NOTE — Patient Instructions (Signed)
Try to stay as active as tolerated 

## 2012-07-01 NOTE — Progress Notes (Signed)
Subjective:    Patient ID: [name], female    DOB: 02-03-1971, 42 y.o.   MRN: [id number]  HPI The patient complains about chronic hand and feet pain . The patient also complains about numbness and tingling in her hands and feet.  The problem has been stable.Except her carpal tunnel has gotten worse referred her to a hand surgeon 2 month ago, patient has not followed up. She has an appointment for a sleep study, and after this she has to have a colonoscopy, she did not go to her appointment and did not reschedule.  The patient reports that nobody from PT has called her again, Dr. Doroteo Bradford ordered PT 2 month ago.   Pain Inventory Average Pain 6 Pain Right Now 4 My pain is intermittent, constant, sharp, burning, dull, stabbing, tingling and aching  In the last 24 hours, has pain interfered with the following? General activity 10 Relation with others 10 Enjoyment of life 10 What TIME of day is your pain at its worst? evening Sleep (in general) Poor  Pain is worse with: walking, bending, standing and some activites Pain improves with: rest and medication Relief from Meds: 6  Mobility walk without assistance walk with assistance use a cane how many minutes can you walk? 10 min. ability to climb steps?  no do you drive?  no  Function disabled: date disabled 2007 I need assistance with the following:  bathing, meal prep, household duties and shopping Do you have any goals in this area?  yes  Neuro/Psych bladder control problems weakness numbness tremor tingling trouble walking spasms dizziness depression anxiety  Prior Studies Any changes since last visit?  no  Physicians involved in your care Any changes since last visit?  no   History reviewed. No pertinent family history. History   Social History  . Marital Status: Legally Separated    Spouse Name: N/A    Number of Children: N/A  . Years of Education: N/A   Social History Main Topics  .  Smoking status: Never Smoker   . Smokeless tobacco: Never Used  . Alcohol Use: None  . Drug Use: None  . Sexually Active: None   Other Topics Concern  . None   Social History Narrative  . None   Past Surgical History  Procedure Laterality Date  . Cholecystectomy    . Abdominoplasty     Past Medical History  Diagnosis Date  . 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) 08/08/2009  . DEPRESSION/ANXIETY 08/08/2009  . IBS 02/02/2010  . Polyneuropathy 12/04/2010   BP 130/73  Pulse 105  Resp 16  Ht 6' (1.829 m)  Wt 292 lb 12.8 oz (132.813 kg)  BMI 39.7 kg/m2  SpO2 95%  LMP 06/07/2012      Review of Systems  Gastrointestinal: Positive for abdominal pain and diarrhea.  Musculoskeletal: Positive for gait problem.  Neurological: Positive for dizziness, tremors, weakness and numbness.       Tingling,spasms  Psychiatric/Behavioral: Positive for dysphoric mood and agitation.  All other systems reviewed and are negative.       Objective:   Physical Exam Constitutional: She is oriented to person, place, and time. She appears well-developed.  Neurological: She is alert and oriented to person, place, and time. She displays atrophy. A sensory deficit is present. She exhibits normal muscle tone and strength grossly 5/5. Gait abnormal.  Reflex Scores:  Patellar reflexes are 2+ on the right side and 2+ on  the left side.  Achilles reflexes trace on the right side and on the left side. Lateral foot intrinsic atrophy. No tibialis anterior atrophy noted The patient has decreased sensation to pinprick, light touch in both feet, proprioception ok bilateral. Pulses are normal Right great toe sore has healed.  Skin: Skin is warm. No erythema.  Psychiatric: She has a normal mood and affect  Not able to heel or toe walk not able to tandem walk.  Soft small mass in right upper medial thigh,( patient has several small  "bumps" on her legs)        Assessment & Plan:  This is a 43 year old female with  1. Chronic pain syndrome  2. Polyneuropathy  3. Chronic foot ulcer, looks much better today, almost closed completely.  4. Diabetes  5. Anxiety, and depression  6. Soft mass on right medial upper thigh, most likely lipoma, advised patient to follow up on this with her PCP at her next appointment, he might refer her to general surgery.  Plan :  Continue with medication, continue with exercising, advised patient to start Pilates with the DVD she owns, physical therapy was ordered at her last visit, she states, that she did not get a call from them to make an appointment.After we followed up on this , PT did not call her again, this time [name] was at the front, and stated that she will make sure , somebody will call the patient to schedule an appointment.  I encouraged her to continue with her walking and try to do this regularly.  Referral to hand surgery, for increased carpal tunnel symptoms 3 month ago, patient has not followed up on this. She states, that she has to reschedule an appointment for a sleep study and colonoscopy first, because of other issues, she did not reschedule those appointments. Discussed that she should see a psychologist, to help with her depression and being unmotivated.  Patient has watched the SCS DVD, and is very interested to do the SCS trial. She has medicaid,at this point I do not know whether somebody would do the trial for a patient who only has medicaid.  Medication refilled.  Follow up in 1 month

## 2012-07-04 ENCOUNTER — Ambulatory Visit: Payer: Medicaid Other | Admitting: Family Medicine

## 2012-07-24 ENCOUNTER — Other Ambulatory Visit: Payer: Self-pay | Admitting: *Deleted

## 2012-07-24 MED ORDER — CLONAZEPAM 2 MG PO TABS: 2.0000 mg | ORAL_TABLET | Freq: Every evening | ORAL | Status: DC | PRN

## 2012-07-24 NOTE — Telephone Encounter (Signed)
Clonazepam refilled per fax request from pharmacy.

## 2012-07-25 ENCOUNTER — Telehealth: Payer: Self-pay | Admitting: Family Medicine

## 2012-07-25 MED ORDER — METFORMIN HCL 1000 MG PO TABS: 1000.0000 mg | ORAL_TABLET | Freq: Every day | ORAL | Status: DC

## 2012-07-25 NOTE — Telephone Encounter (Signed)
1 final refill given. Patient needs to follow up and keep appointment on 5/22.

## 2012-07-25 NOTE — Telephone Encounter (Signed)
Pt advised of med at ph armacy

## 2012-07-25 NOTE — Telephone Encounter (Signed)
Pt has made an appt for 5/22 and will be out of her metformin today - she has promised to come to appt if Dr Durene Cal will refill her med one more time.

## 2012-07-28 ENCOUNTER — Ambulatory Visit (HOSPITAL_BASED_OUTPATIENT_CLINIC_OR_DEPARTMENT_OTHER): Payer: Medicaid Other | Admitting: Physical Medicine & Rehabilitation

## 2012-07-28 ENCOUNTER — Encounter: Payer: Medicaid Other | Attending: Physical Medicine and Rehabilitation

## 2012-07-28 ENCOUNTER — Encounter: Payer: Self-pay | Admitting: Physical Medicine & Rehabilitation

## 2012-07-28 VITALS — BP 147/82 | HR 92 | Resp 17 | Ht 70.0 in | Wt 276.0 lb

## 2012-07-28 DIAGNOSIS — L97509 Non-pressure chronic ulcer of other part of unspecified foot with unspecified severity: Secondary | ICD-10-CM | POA: Insufficient documentation

## 2012-07-28 DIAGNOSIS — G894 Chronic pain syndrome: Secondary | ICD-10-CM

## 2012-07-28 DIAGNOSIS — G609 Hereditary and idiopathic neuropathy, unspecified: Secondary | ICD-10-CM | POA: Insufficient documentation

## 2012-07-28 DIAGNOSIS — R209 Unspecified disturbances of skin sensation: Secondary | ICD-10-CM | POA: Insufficient documentation

## 2012-07-28 DIAGNOSIS — M79609 Pain in unspecified limb: Secondary | ICD-10-CM | POA: Insufficient documentation

## 2012-07-28 DIAGNOSIS — F411 Generalized anxiety disorder: Secondary | ICD-10-CM | POA: Insufficient documentation

## 2012-07-28 DIAGNOSIS — E1149 Type 2 diabetes mellitus with other diabetic neurological complication: Secondary | ICD-10-CM | POA: Insufficient documentation

## 2012-07-28 DIAGNOSIS — Z5181 Encounter for therapeutic drug level monitoring: Secondary | ICD-10-CM

## 2012-07-28 DIAGNOSIS — F329 Major depressive disorder, single episode, unspecified: Secondary | ICD-10-CM | POA: Insufficient documentation

## 2012-07-28 DIAGNOSIS — Z79899 Other long term (current) drug therapy: Secondary | ICD-10-CM

## 2012-07-28 DIAGNOSIS — F3289 Other specified depressive episodes: Secondary | ICD-10-CM | POA: Insufficient documentation

## 2012-07-28 DIAGNOSIS — G629 Polyneuropathy, unspecified: Secondary | ICD-10-CM

## 2012-07-28 DIAGNOSIS — G5600 Carpal tunnel syndrome, unspecified upper limb: Secondary | ICD-10-CM | POA: Insufficient documentation

## 2012-07-28 MED ORDER — MORPHINE SULFATE ER 30 MG PO TBCR: 30.0000 mg | EXTENDED_RELEASE_TABLET | Freq: Three times a day (TID) | ORAL | Status: DC

## 2012-07-28 NOTE — Progress Notes (Signed)
Subjective:    Patient ID: [name], female    DOB: [birth year], 42 y.o.   MRN: [id number]  HPI Has had some back pain over last couple weeks. No apparent injury. No other new medical problems. Still has burning pain and sometimes coldness sensation in the feet. No open areas. Pain Inventory Average Pain 6 Pain Right Now 6 My pain is constant, sharp, burning, dull, stabbing, tingling and aching  In the last 24 hours, has pain interfered with the following? General activity 10 Relation with others 10 Enjoyment of life 10 What TIME of day is your pain at its worst? evening,night Sleep (in general) Poor  Pain is worse with: walking, standing and some activites Pain improves with: rest, heat/ice, medication and injections Relief from Meds: 7  Mobility walk without assistance walk with assistance use a cane how many minutes can you walk? 10 ability to climb steps?  no do you drive?  no needs help with transfers Do you have any goals in this area?  yes  Function disabled: date disabled na I need assistance with the following:  bathing, household duties and shopping Do you have any goals in this area?  yes  Neuro/Psych weakness numbness tingling trouble walking spasms dizziness depression anxiety  Prior Studies Any changes since last visit?  no  Physicians involved in your care Any changes since last visit?  no   History reviewed. No pertinent family history. History   Social History  . Marital Status: Legally Separated    Spouse Name: N/A    Number of Children: N/A  . Years of Education: N/A   Social History Main Topics  . Smoking status: Never Smoker   . Smokeless tobacco: Never Used  . Alcohol Use: None  . Drug Use: None  . Sexually Active: None   Other Topics Concern  . None   Social History Narrative  . None   Past Surgical History  Procedure Laterality Date  . Cholecystectomy    . Abdominoplasty     Past Medical History   Diagnosis Date  . 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) 08/08/2009  . DEPRESSION/ANXIETY 08/08/2009  . IBS 02/02/2010  . Polyneuropathy 12/04/2010   BP 147/82  Pulse 92  Resp 17  Ht 5\' 10"  (1.778 m)  Wt 276 lb (125.193 kg)  BMI 39.6 kg/m2  SpO2 97%  LMP 07/06/2012     Review of Systems  Constitutional: Positive for chills and fatigue.  Gastrointestinal: Positive for nausea, vomiting, abdominal pain, diarrhea and constipation.  Musculoskeletal: Positive for gait problem.  Neurological: Positive for dizziness, weakness and numbness.       Tingling,spasms.  Psychiatric/Behavioral: Positive for dysphoric mood and agitation.  All other systems reviewed and are negative.       Objective:   Physical Exam  Absent pinprick in the right foot ankle and shin area. Does have normal pinprick at the knee. Left foot has decreased pinprick in the great toe but intact in the little toe. Also intact at the ankle but has a  spotty distribution  Intrinsic minus deformity of both feet. Decreased toe flexor and extensor strength. Ankle dorsiflexor and plantar flexor strength is good Skin shows no evidence of breakdown she does have callus on the plantar surface of her right first MTP Ambulates without evidence of toe drag or knee instability    Assessment & Plan:  1. Diabetic polyneuropathy severe with pain. She does not tolerate anticonvulsant-type  medications. She has been functional on morphine sulfate 30 mg 3 times a day. We have discussed spinal cord stimulation, Funding may be an issue. We discussed exercise general fitness. Recommend swimming in the pool walking in the pool or stationary bicycling.

## 2012-07-28 NOTE — Patient Instructions (Signed)
Recommend stationary bicycling or swimming

## 2012-08-07 ENCOUNTER — Other Ambulatory Visit: Payer: Self-pay | Admitting: Family Medicine

## 2012-08-08 ENCOUNTER — Other Ambulatory Visit: Payer: Self-pay | Admitting: *Deleted

## 2012-08-08 DIAGNOSIS — F32A Depression, unspecified: Secondary | ICD-10-CM

## 2012-08-08 DIAGNOSIS — F329 Major depressive disorder, single episode, unspecified: Secondary | ICD-10-CM

## 2012-08-08 MED ORDER — PAROXETINE HCL 40 MG PO TABS: ORAL_TABLET | ORAL | Status: DC

## 2012-08-08 NOTE — Telephone Encounter (Signed)
Covering box for Dr. Durene Cal. Refilling paxil x 2 months and pt has f/u appt with him 5/22.

## 2012-08-08 NOTE — Telephone Encounter (Addendum)
Covering Dr. Erasmo Leventhal box. Patient has appointment 5/22 with Dr. Durene Cal and has been told she needs to follow up prior to more refills. Refilled pravachol x 1 month and refused metformin as just rx'ed metformin 5/2.

## 2012-08-14 ENCOUNTER — Ambulatory Visit (INDEPENDENT_AMBULATORY_CARE_PROVIDER_SITE_OTHER): Payer: Medicaid Other | Admitting: Family Medicine

## 2012-08-14 ENCOUNTER — Encounter: Payer: Self-pay | Admitting: Family Medicine

## 2012-08-14 VITALS — BP 139/80 | HR 99 | Temp 99.3°F | Ht 72.0 in | Wt 270.4 lb

## 2012-08-14 DIAGNOSIS — R5381 Other malaise: Secondary | ICD-10-CM

## 2012-08-14 DIAGNOSIS — F3289 Other specified depressive episodes: Secondary | ICD-10-CM

## 2012-08-14 DIAGNOSIS — F329 Major depressive disorder, single episode, unspecified: Secondary | ICD-10-CM

## 2012-08-14 DIAGNOSIS — F32A Depression, unspecified: Secondary | ICD-10-CM

## 2012-08-14 DIAGNOSIS — R0989 Other specified symptoms and signs involving the circulatory and respiratory systems: Secondary | ICD-10-CM

## 2012-08-14 DIAGNOSIS — E119 Type 2 diabetes mellitus without complications: Secondary | ICD-10-CM

## 2012-08-14 DIAGNOSIS — R0609 Other forms of dyspnea: Secondary | ICD-10-CM

## 2012-08-14 DIAGNOSIS — E1149 Type 2 diabetes mellitus with other diabetic neurological complication: Secondary | ICD-10-CM

## 2012-08-14 DIAGNOSIS — E785 Hyperlipidemia, unspecified: Secondary | ICD-10-CM

## 2012-08-14 DIAGNOSIS — R0683 Snoring: Secondary | ICD-10-CM

## 2012-08-14 DIAGNOSIS — Z23 Encounter for immunization: Secondary | ICD-10-CM

## 2012-08-14 DIAGNOSIS — E1159 Type 2 diabetes mellitus with other circulatory complications: Secondary | ICD-10-CM | POA: Insufficient documentation

## 2012-08-14 DIAGNOSIS — I10 Essential (primary) hypertension: Secondary | ICD-10-CM

## 2012-08-14 MED ORDER — ENALAPRIL MALEATE 20 MG PO TABS: 20.0000 mg | ORAL_TABLET | Freq: Every day | ORAL | Status: DC

## 2012-08-14 MED ORDER — PRAVASTATIN SODIUM 40 MG PO TABS: 40.0000 mg | ORAL_TABLET | Freq: Every evening | ORAL | Status: DC

## 2012-08-14 MED ORDER — METFORMIN HCL 1000 MG PO TABS: 1000.0000 mg | ORAL_TABLET | Freq: Two times a day (BID) | ORAL | Status: DC

## 2012-08-14 MED ORDER — METFORMIN HCL 1000 MG PO TABS: 1000.0000 mg | ORAL_TABLET | Freq: Every day | ORAL | Status: DC

## 2012-08-14 MED ORDER — PAROXETINE HCL 40 MG PO TABS: ORAL_TABLET | ORAL | Status: DC

## 2012-08-14 NOTE — Progress Notes (Signed)
Subjective:   #  DIABETES Type II Medications taking and tolerating-yes, metformin 1000mg  BID Blood Sugars per patient-fasting around 110 Diet-vegetarian but focuses heavily on carbs (lots of pasta as trying to go Vegan) Regular Exercise-no  Last eye exam-12/27/10. Has visit scheduled in 1 week.  Last foot exam-06/13/12, will repeat 08/14/12 Last microalbumin/on ace inhibitor-on ace-Inhibitor Pneumovax-today On Aspirin-will start On statin-yes, few days out of it Daily foot monitoring-yesyes  ROS- drinks a lot of green tea intentionally but then gets up to pee at night. Denies Vision changes. Has known neuropathy so has  feet and hand numbness/pain/tingling. Denies Hypoglycemia symptoms (shaky, sweaty, hungry,  anxious, tremor, palpitations, confusion, behavior change).   Hemoglobin a1c:  Lab Results  Component Value Date   HGBA1C 6.6 06/14/2011   HGBA1C 6.0 12/04/2010   HGBA1C 6.3 07/05/2010   # Hypertension- BP Readings from Last 3 Encounters:  08/14/12 139/80  07/28/12 147/82  07/01/12 130/73  Home BP monitoring-no Compliant with medications-yes without side effects Denies any CP, SOB, blurry vision, LE edema, transient weakness, orthopnea, PND. HA from morphine.   #Snoring/concern for OSA Snores regularly and wakes daughter.  Daytime somnolence/fatigue. Occasionally wakes up gasping for air but denies orthopnea. No shortness of breath as above or DOE.    ROS--See HPI  Past Medical History . Chronic pain 04/23/2011  . Obesity, Class II, BMI 35-39.9, with comorbidity 02/10/2011  . Hypertriglyceridemia 12/06/2010  . Polyneuropathy 12/04/2010  . IBS 02/02/2010  . Diabetes Mellitus Type II, controlled but with neurological manifestations 08/08/2009  . DEPRESSION/ANXIETY 08/08/2009  Reviewed problem list.  Medications- reviewed and updated Chief complaint-noted  Objective: BP 139/80  Pulse 99  Temp(Src) 99.3 F (37.4 C) (Oral)  Ht 6' (1.829 m)  Wt 270 lb 6.4 oz  (122.653 kg)  BMI 36.66 kg/m2  LMP 07/06/2012 Gen: NAD, resting comfortably in chair, obese CV: RRR no murmurs rubs or gallops Lungs: CTAB no crackles, wheeze, rhonchi Skin: warm, dry Neuro: grossly normal, moves all extremities DM foot exam: no sensation to monofilament until about 2 inches above right medial malleolus and about 8 inches above left medial malleolus, DP and PT pulses 1+. Callous formation at base of 1st toe bilaterally. Crusting, dry skin noted at crease of first toe as well.   Assessment/Plan:  Health Maintenance Due  Topic Date Due  . Pneumococcal Polysaccharide Vaccine (#1)-given today 04/12/1970  . Ophthalmology Exam -scheduled for 1 week 12/27/2011  Gyn issues-followed by GYN for breast exams and pap smears. Not sexually active.   #Snoring/concern for OSA Will get split night sleep study to evaluate for OSA.

## 2012-08-14 NOTE — Assessment & Plan Note (Signed)
Well controlled on enalapril but creeping up. Monitor q6 months.

## 2012-08-14 NOTE — Assessment & Plan Note (Addendum)
Well controlled per report. Will get a1c. HM up to date with pneumovax today and if gets eye exam as planned next week. Advised to start aspirin.   Wrote in AVS to at least follow up q6 months

## 2012-08-14 NOTE — Patient Instructions (Addendum)
Great to see you again today!  1. Sounds like your diabetes is well controlled, we will let you know what your a1c is when we get your other labs or you can wait for it.  2. Come back for fasting labs. You will get a letter if your labs are normal or do not require medication changes, if any other follow up is needed, we will call you.  3. You got pneumovax today.  4. I will get you set up for a sleep study.  5. Please call for mammogram 6. Continue enalapril for blood pressure.   See me at my next available to follow up on your abdominal pain-we can possibly cut your skin tags off at that visit,  Dr. Durene Cal

## 2012-08-15 ENCOUNTER — Telehealth: Payer: Self-pay | Admitting: *Deleted

## 2012-08-15 NOTE — Addendum Note (Signed)
Addended by: Farrell Ours on: 08/15/2012 01:45 PM   Modules accepted: Orders

## 2012-08-15 NOTE — Telephone Encounter (Signed)
TC to patient, LMOVM.  See MD note below.  Maryanna Stuber, Darlyne Russian, CMA

## 2012-08-15 NOTE — Telephone Encounter (Signed)
Message copied by Radene Ou on Fri Aug 15, 2012 11:03 AM ------      Message from: Shelva Majestic      Created: Fri Aug 15, 2012  9:36 AM       Alc unchanged from previous at 6.6. No changes to medications. Nursing staff to inform patient. ------

## 2012-08-19 ENCOUNTER — Other Ambulatory Visit: Payer: Medicaid Other

## 2012-08-25 ENCOUNTER — Encounter: Payer: Self-pay | Admitting: Physical Medicine and Rehabilitation

## 2012-08-25 ENCOUNTER — Encounter
Payer: Medicaid Other | Attending: Physical Medicine and Rehabilitation | Admitting: Physical Medicine and Rehabilitation

## 2012-08-25 VITALS — BP 129/74 | HR 91 | Temp 97.6°F | Resp 14 | Ht 72.0 in | Wt 274.0 lb

## 2012-08-25 DIAGNOSIS — F3289 Other specified depressive episodes: Secondary | ICD-10-CM | POA: Insufficient documentation

## 2012-08-25 DIAGNOSIS — F329 Major depressive disorder, single episode, unspecified: Secondary | ICD-10-CM | POA: Insufficient documentation

## 2012-08-25 DIAGNOSIS — E119 Type 2 diabetes mellitus without complications: Secondary | ICD-10-CM | POA: Insufficient documentation

## 2012-08-25 DIAGNOSIS — G5600 Carpal tunnel syndrome, unspecified upper limb: Secondary | ICD-10-CM | POA: Insufficient documentation

## 2012-08-25 DIAGNOSIS — F411 Generalized anxiety disorder: Secondary | ICD-10-CM | POA: Insufficient documentation

## 2012-08-25 DIAGNOSIS — R229 Localized swelling, mass and lump, unspecified: Secondary | ICD-10-CM | POA: Insufficient documentation

## 2012-08-25 DIAGNOSIS — L97509 Non-pressure chronic ulcer of other part of unspecified foot with unspecified severity: Secondary | ICD-10-CM | POA: Insufficient documentation

## 2012-08-25 DIAGNOSIS — G894 Chronic pain syndrome: Secondary | ICD-10-CM | POA: Insufficient documentation

## 2012-08-25 DIAGNOSIS — G609 Hereditary and idiopathic neuropathy, unspecified: Secondary | ICD-10-CM

## 2012-08-25 DIAGNOSIS — R209 Unspecified disturbances of skin sensation: Secondary | ICD-10-CM | POA: Insufficient documentation

## 2012-08-25 DIAGNOSIS — G629 Polyneuropathy, unspecified: Secondary | ICD-10-CM

## 2012-08-25 DIAGNOSIS — M79609 Pain in unspecified limb: Secondary | ICD-10-CM | POA: Insufficient documentation

## 2012-08-25 MED ORDER — MORPHINE SULFATE ER 30 MG PO TBCR: 30.0000 mg | EXTENDED_RELEASE_TABLET | Freq: Three times a day (TID) | ORAL | Status: DC

## 2012-08-25 NOTE — Patient Instructions (Signed)
Try to stay as active as tolerated 

## 2012-08-25 NOTE — Progress Notes (Signed)
Subjective:    Patient ID: [name], female    DOB: [birth year], 41 y.o.   MRN: [id number]  HPI The patient complains about chronic hand and feet pain . The patient also complains about numbness and tingling in her hands and feet.  The problem has been stable.Except her carpal tunnel has gotten worse referred her to a hand surgeon 2 month ago, patient has not followed up. She has an appointment for a sleep study, and after this she has to have a colonoscopy, she did not go to her appointment and did not reschedule.  The patient reports that nobody from PT has called her again, Dr. Doroteo Bradford ordered PT 2 month ago.We talked to [name] about that at her last visit, and [name] stated that she will tell the PT office when she gets over there, the patient states today, that nobody has called her still.   Pain Inventory Average Pain 7 Pain Right Now 6 My pain is constant, sharp, burning, dull, stabbing, tingling and aching  In the last 24 hours, has pain interfered with the following? General activity 10 Relation with others 10 Enjoyment of life 10 What TIME of day is your pain at its worst? evening and night Sleep (in general) Poor  Pain is worse with: walking, standing and some activites Pain improves with: rest, therapy/exercise, medication and massage Relief from Meds: 7  Mobility walk without assistance walk with assistance use a cane how many minutes can you walk? 10 ability to climb steps?  no do you drive?  no Do you have any goals in this area?  yes  Function disabled: date disabled . I need assistance with the following:  bathing, toileting, meal prep, household duties and shopping Do you have any goals in this area?  yes  Neuro/Psych weakness numbness tremor tingling trouble walking spasms dizziness depression anxiety  Prior Studies Any changes since last visit?  no  Physicians involved in your care Any changes since last visit?  no   History  reviewed. No pertinent family history. History   Social History  . Marital Status: Legally Separated    Spouse Name: N/A    Number of Children: N/A  . Years of Education: N/A   Social History Main Topics  . Smoking status: Never Smoker   . Smokeless tobacco: Never Used  . Alcohol Use: None  . Drug Use: None  . Sexually Active: None   Other Topics Concern  . None   Social History Narrative  . None   Past Surgical History  Procedure Laterality Date  . Cholecystectomy    . Abdominoplasty     Past Medical History  Diagnosis Date  . 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) 08/08/2009  . DEPRESSION/ANXIETY 08/08/2009  . IBS 02/02/2010  . Polyneuropathy 12/04/2010   BP 129/74  Pulse 91  Temp(Src) 97.6 F (36.4 C)  Resp 14  Ht 6' (1.829 m)  Wt 274 lb (124.286 kg)  BMI 37.15 kg/m2  SpO2 98%  LMP 07/04/2012     Review of Systems  Constitutional: Positive for diaphoresis.  Gastrointestinal: Positive for nausea, vomiting, abdominal pain and constipation.  Neurological: Positive for dizziness, tremors, weakness and numbness.  Psychiatric/Behavioral: Positive for dysphoric mood. The patient is nervous/anxious.   All other systems reviewed and are negative.       Objective:   Physical Exam Constitutional: She is oriented to person, place, and time. She appears well-developed.  Neurological: She  is alert and oriented to person, place, and time. She displays atrophy. A sensory deficit is present. She exhibits normal muscle tone and strength grossly 5/5. Gait abnormal.  Reflex Scores:  Patellar reflexes are 2+ on the right side and 2+ on the left side.  Achilles reflexes trace on the right side and on the left side. Lateral foot intrinsic atrophy. No tibialis anterior atrophy noted The patient has decreased sensation to pinprick, light touch in both feet, proprioception ok bilateral. Pulses are  normal Right great toe sore has healed.  Skin: Skin is warm. No erythema.  Psychiatric: She has a normal mood and affect  Not able to heel or toe walk not able to tandem walk.  Soft small mass in right upper medial thigh,( patient has several small "bumps" on her legs)        Assessment & Plan:  This is a 42 year old female with  1. Chronic pain syndrome  2. Polyneuropathy  3. Chronic foot ulcer, looks much better today, almost closed completely.  4. Diabetes  5. Anxiety, and depression  6. Soft mass on right medial upper thigh, most likely lipoma, advised patient to follow up on this with her PCP at her next appointment, he might refer her to general surgery.  Plan :  Continue with medication, continue with exercising, advised patient to start Pilates with the DVD she owns, physical therapy was ordered at her last visit, she states, that she did not get a call from them to make an appointment.After we followed up on this , PT did not call her again, this time [name] was at the front, and stated that she will make sure , somebody will call the patient to schedule an appointment. The patient states, that she still did not get a call I encouraged her to continue with her walking and try to do this regularly.  Referral to hand surgery, for increased carpal tunnel symptoms 3 month ago, patient has not followed up on this. She states, that she has to reschedule an appointment for a sleep study and colonoscopy first, because of other issues, she did not reschedule those appointments. Discussed that she should see a psychologist, to help with her depression and being unmotivated.  Patient has watched the SCS DVD, and is very interested to do the SCS trial. She has medicaid,at this point I do not know whether somebody would do the trial for a patient who only has medicaid.  Medication refilled.  Follow up in 1 month

## 2012-09-17 ENCOUNTER — Ambulatory Visit (HOSPITAL_BASED_OUTPATIENT_CLINIC_OR_DEPARTMENT_OTHER): Payer: Medicaid Other

## 2012-09-23 ENCOUNTER — Other Ambulatory Visit: Payer: Self-pay | Admitting: Physical Medicine and Rehabilitation

## 2012-09-24 ENCOUNTER — Encounter: Payer: Medicaid Other | Admitting: Physical Medicine and Rehabilitation

## 2012-09-25 ENCOUNTER — Ambulatory Visit (HOSPITAL_BASED_OUTPATIENT_CLINIC_OR_DEPARTMENT_OTHER): Payer: Medicaid Other | Admitting: Physical Medicine & Rehabilitation

## 2012-09-25 ENCOUNTER — Encounter: Payer: Self-pay | Admitting: Physical Medicine & Rehabilitation

## 2012-09-25 ENCOUNTER — Encounter: Payer: Medicaid Other | Attending: Physical Medicine and Rehabilitation

## 2012-09-25 VITALS — BP 168/71 | HR 98 | Resp 17 | Ht 72.0 in | Wt 275.0 lb

## 2012-09-25 DIAGNOSIS — M545 Low back pain, unspecified: Secondary | ICD-10-CM

## 2012-09-25 DIAGNOSIS — E1149 Type 2 diabetes mellitus with other diabetic neurological complication: Secondary | ICD-10-CM

## 2012-09-25 DIAGNOSIS — G894 Chronic pain syndrome: Secondary | ICD-10-CM | POA: Insufficient documentation

## 2012-09-25 DIAGNOSIS — G5600 Carpal tunnel syndrome, unspecified upper limb: Secondary | ICD-10-CM | POA: Insufficient documentation

## 2012-09-25 DIAGNOSIS — L97509 Non-pressure chronic ulcer of other part of unspecified foot with unspecified severity: Secondary | ICD-10-CM | POA: Insufficient documentation

## 2012-09-25 DIAGNOSIS — M79609 Pain in unspecified limb: Secondary | ICD-10-CM | POA: Insufficient documentation

## 2012-09-25 DIAGNOSIS — R209 Unspecified disturbances of skin sensation: Secondary | ICD-10-CM | POA: Insufficient documentation

## 2012-09-25 DIAGNOSIS — F329 Major depressive disorder, single episode, unspecified: Secondary | ICD-10-CM | POA: Insufficient documentation

## 2012-09-25 DIAGNOSIS — F3289 Other specified depressive episodes: Secondary | ICD-10-CM | POA: Insufficient documentation

## 2012-09-25 DIAGNOSIS — G8929 Other chronic pain: Secondary | ICD-10-CM

## 2012-09-25 DIAGNOSIS — G609 Hereditary and idiopathic neuropathy, unspecified: Secondary | ICD-10-CM | POA: Insufficient documentation

## 2012-09-25 DIAGNOSIS — F411 Generalized anxiety disorder: Secondary | ICD-10-CM | POA: Insufficient documentation

## 2012-09-25 MED ORDER — TOPIRAMATE 25 MG PO TABS: 25.0000 mg | ORAL_TABLET | Freq: Two times a day (BID) | ORAL | Status: DC

## 2012-09-25 MED ORDER — MORPHINE SULFATE ER 30 MG PO TBCR: 30.0000 mg | EXTENDED_RELEASE_TABLET | Freq: Three times a day (TID) | ORAL | Status: DC

## 2012-09-25 NOTE — Progress Notes (Signed)
Subjective:    Patient ID: [name], female    DOB: 04-Jul-1970, 42 y.o.   MRN: [id number]  HPI Scheduled for sleep study 7/20 PT ordered but not scheduled  Intermittent back pain after patient started to mobilize Has access to swimming pool  Balance problems, cannot stand without holding onto something  Pain Inventory Average Pain 6 Pain Right Now 6 My pain is intermittent, constant, sharp, burning, dull, stabbing, tingling and aching  In the last 24 hours, has pain interfered with the following? General activity 10 Relation with others 10 Enjoyment of life 10 What TIME of day is your pain at its worst? evening,night Sleep (in general) Poor  Pain is worse with: walking, inactivity, standing and some activites Pain improves with: rest and medication Relief from Meds: 7  Mobility walk without assistance walk with assistance use a cane how many minutes can you walk? 10 ability to climb steps?  no do you drive?  yes needs help with transfers Do you have any goals in this area?  yes  Function disabled: date disabled na Do you have any goals in this area?  yes  Neuro/Psych No problems in this area  Prior Studies Any changes since last visit?  no  Physicians involved in your care Any changes since last visit?  no   History reviewed. No pertinent family history. History   Social History  . Marital Status: Legally Separated    Spouse Name: N/A    Number of Children: N/A  . Years of Education: N/A   Social History Main Topics  . Smoking status: Never Smoker   . Smokeless tobacco: Never Used  . Alcohol Use: None  . Drug Use: None  . Sexually Active: None   Other Topics Concern  . None   Social History Narrative  . None   Past Surgical History  Procedure Laterality Date  . Cholecystectomy    . Abdominoplasty     Past Medical History  Diagnosis Date  . Type II or unspecified type diabetes mellitus with neurological manifestations, not  stated 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) 08/08/2009  . DEPRESSION/ANXIETY 08/08/2009  . IBS 02/02/2010  . Polyneuropathy 12/04/2010   BP 168/71  Pulse 98  Resp 17  Ht 6' (1.829 m)  Wt 275 lb (124.739 kg)  BMI 37.29 kg/m2  SpO2 97%  LMP 08/28/2012     Review of Systems  All other systems reviewed and are negative.       Objective:   Physical Exam  Nursing note and vitals reviewed. Musculoskeletal:       Lumbar back: She exhibits tenderness.  Lumbar paraspinal tenderness   lumbar range of motion limited by balance Absent pinprick in the right foot ankle and shin area. Does have normal pinprick at the knee.  Left foot has decreased pinprick in the great toe but intact in the little toe. Also intact at the ankle but has a spotty distribution  Intrinsic minus deformity of both feet.  Decreased toe flexor and extensor strength. Ankle dorsiflexor and plantar flexor strength is good  Skin shows no evidence of breakdown she does have callus on the plantar surface of her right first MTP        Assessment & Plan:  1. Severe diabetic polyneuropathy with pain. She has been functional on morphine sulfate controlled-release 30 mg 3 times per day.  Retrial Topamax. She's not sure how much this helps but will track this over time. We discussed increasing activity level.  We discussed using  pull in the summer. Water walking.  2. Low back pain this is mainly muscular. No signs of her date July A. Poor mobility and sedentary lifestyle most obvious cause. We'll send to physical therapy.  3. 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 Refer to physical therapy  4. Morbid obesity contributing to above hopefully Topamax will reduce appetite as a side effect

## 2012-09-25 NOTE — Patient Instructions (Signed)
May need to increase topamax to 50mg  next month

## 2012-10-02 ENCOUNTER — Other Ambulatory Visit: Payer: Self-pay

## 2012-10-09 ENCOUNTER — Other Ambulatory Visit: Payer: Self-pay | Admitting: *Deleted

## 2012-10-09 DIAGNOSIS — IMO0001 Reserved for inherently not codable concepts without codable children: Secondary | ICD-10-CM

## 2012-10-09 MED ORDER — NORGESTIM-ETH ESTRAD TRIPHASIC 0.18/0.215/0.25 MG-25 MCG PO TABS: 1.0000 | ORAL_TABLET | Freq: Every day | ORAL | Status: DC

## 2012-10-12 ENCOUNTER — Ambulatory Visit (HOSPITAL_BASED_OUTPATIENT_CLINIC_OR_DEPARTMENT_OTHER): Payer: Medicaid Other | Attending: Family Medicine

## 2012-10-23 ENCOUNTER — Encounter: Payer: Medicaid Other | Admitting: Physical Medicine and Rehabilitation

## 2012-10-23 ENCOUNTER — Other Ambulatory Visit: Payer: Self-pay

## 2012-10-23 MED ORDER — MORPHINE SULFATE ER 30 MG PO TBCR: 30.0000 mg | EXTENDED_RELEASE_TABLET | Freq: Three times a day (TID) | ORAL | Status: DC

## 2012-10-27 ENCOUNTER — Other Ambulatory Visit: Payer: Self-pay

## 2012-10-27 DIAGNOSIS — Z1231 Encounter for screening mammogram for malignant neoplasm of breast: Secondary | ICD-10-CM

## 2012-10-29 ENCOUNTER — Other Ambulatory Visit: Payer: Self-pay

## 2012-10-29 MED ORDER — TOPIRAMATE 25 MG PO TABS: 25.0000 mg | ORAL_TABLET | Freq: Two times a day (BID) | ORAL | Status: DC

## 2012-10-30 ENCOUNTER — Ambulatory Visit: Payer: Self-pay | Admitting: Obstetrics

## 2012-11-10 ENCOUNTER — Ambulatory Visit: Payer: Medicaid Other

## 2012-11-18 ENCOUNTER — Ambulatory Visit: Payer: Self-pay | Admitting: Obstetrics

## 2012-11-19 ENCOUNTER — Other Ambulatory Visit: Payer: Self-pay | Admitting: Physical Medicine and Rehabilitation

## 2012-11-21 ENCOUNTER — Encounter: Payer: Self-pay | Admitting: Physical Medicine and Rehabilitation

## 2012-11-21 ENCOUNTER — Encounter
Payer: Medicaid Other | Attending: Physical Medicine and Rehabilitation | Admitting: Physical Medicine and Rehabilitation

## 2012-11-21 VITALS — BP 146/80 | HR 96 | Resp 14 | Ht 72.0 in | Wt 275.0 lb

## 2012-11-21 DIAGNOSIS — M79609 Pain in unspecified limb: Secondary | ICD-10-CM | POA: Insufficient documentation

## 2012-11-21 DIAGNOSIS — G629 Polyneuropathy, unspecified: Secondary | ICD-10-CM

## 2012-11-21 DIAGNOSIS — R209 Unspecified disturbances of skin sensation: Secondary | ICD-10-CM | POA: Insufficient documentation

## 2012-11-21 DIAGNOSIS — G609 Hereditary and idiopathic neuropathy, unspecified: Secondary | ICD-10-CM | POA: Insufficient documentation

## 2012-11-21 DIAGNOSIS — F411 Generalized anxiety disorder: Secondary | ICD-10-CM | POA: Insufficient documentation

## 2012-11-21 DIAGNOSIS — L97509 Non-pressure chronic ulcer of other part of unspecified foot with unspecified severity: Secondary | ICD-10-CM | POA: Insufficient documentation

## 2012-11-21 DIAGNOSIS — G894 Chronic pain syndrome: Secondary | ICD-10-CM | POA: Insufficient documentation

## 2012-11-21 DIAGNOSIS — F3289 Other specified depressive episodes: Secondary | ICD-10-CM | POA: Insufficient documentation

## 2012-11-21 DIAGNOSIS — E119 Type 2 diabetes mellitus without complications: Secondary | ICD-10-CM | POA: Insufficient documentation

## 2012-11-21 DIAGNOSIS — G5600 Carpal tunnel syndrome, unspecified upper limb: Secondary | ICD-10-CM | POA: Insufficient documentation

## 2012-11-21 DIAGNOSIS — L989 Disorder of the skin and subcutaneous tissue, unspecified: Secondary | ICD-10-CM | POA: Insufficient documentation

## 2012-11-21 DIAGNOSIS — F329 Major depressive disorder, single episode, unspecified: Secondary | ICD-10-CM | POA: Insufficient documentation

## 2012-11-21 MED ORDER — MORPHINE SULFATE ER 30 MG PO TBCR: 30.0000 mg | EXTENDED_RELEASE_TABLET | Freq: Three times a day (TID) | ORAL | Status: DC

## 2012-11-21 NOTE — Patient Instructions (Signed)
Try to stay as active as tolerated 

## 2012-11-21 NOTE — Progress Notes (Signed)
Subjective:    Patient ID: [name], female    DOB: 20-Nov-1970, 42 y.o.   MRN: [id number]  HPI The patient complains about chronic hand and feet pain . The patient also complains about numbness and tingling in her hands and feet.  The problem has been stable.Except her carpal tunnel has gotten worse referred her to a hand surgeon 2 month ago, patient has not followed up. She has an appointment for a sleep study, and after this she has to have a colonoscopy, she did not go to her appointment and did not reschedule.  The patient reports that nobody from PT has called her again, Dr. Doroteo Bradford ordered PT 2 month ago.We talked to [name] about that at her last visit, and [name] stated that she will tell the PT office when she gets over there, the patient states today, that nobody has called her still. She has not started PT yet.  Pain Inventory Average Pain 8 Pain Right Now 6 My pain is sharp, burning, dull, stabbing, tingling and aching  In the last 24 hours, has pain interfered with the following? General activity 10 Relation with others 10 Enjoyment of life 10 What TIME of day is your pain at its worst? evening, night Sleep (in general) Poor  Pain is worse with: walking, standing and some activites Pain improves with: rest and medication Relief from Meds: 6  Mobility walk without assistance walk with assistance use a cane how many minutes can you walk? 5-10 ability to climb steps?  no do you drive?  no needs help with transfers Do you have any goals in this area?  yes  Function disabled: date disabled na I need assistance with the following:  bathing, meal prep, household duties and shopping Do you have any goals in this area?  yes  Neuro/Psych weakness numbness tingling trouble walking spasms depression anxiety  Prior Studies Any changes since last visit?  no  Physicians involved in your care Any changes since last visit?  no   History reviewed. No  pertinent family history. History   Social History  . Marital Status: Legally Separated    Spouse Name: N/A    Number of Children: N/A  . Years of Education: N/A   Social History Main Topics  . Smoking status: Never Smoker   . Smokeless tobacco: Never Used  . Alcohol Use: None  . Drug Use: None  . Sexual Activity: None   Other Topics Concern  . None   Social History Narrative  . None   Past Surgical History  Procedure Laterality Date  . Cholecystectomy    . Abdominoplasty     Past Medical History  Diagnosis Date  . 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) 08/08/2009  . DEPRESSION/ANXIETY 08/08/2009  . IBS 02/02/2010  . Polyneuropathy 12/04/2010   BP 146/80  Pulse 96  Resp 14  Ht 6' (1.829 m)  Wt 275 lb (124.739 kg)  BMI 37.29 kg/m2  SpO2 98%  LMP 10/25/2012     Review of Systems  Gastrointestinal: Positive for nausea, abdominal pain and diarrhea.  Musculoskeletal: Positive for gait problem.  Neurological: Positive for weakness and numbness.       Spasms, tingling  Psychiatric/Behavioral: Positive for dysphoric mood. The patient is nervous/anxious.   All other systems reviewed and are negative.       Objective:   Physical Exam Constitutional: She is oriented to person, place, and time. She appears well-developed.  Neurological: She is alert and oriented to person, place, and time. She displays atrophy. A sensory deficit is present. She exhibits normal muscle tone and strength grossly 5/5. Gait abnormal.  Reflex Scores:  Patellar reflexes are 2+ on the right side and 2+ on the left side.  Achilles reflexes trace on the right side and on the left side. Lateral foot intrinsic atrophy. No tibialis anterior atrophy noted The patient has decreased sensation to pinprick, light touch in both feet, proprioception ok bilateral. Pulses are normal Left great toe sore has healed, but redness  around it, temperature is increased, and it is swollen..  Skin: Skin is warm. No erythema.  Psychiatric: She has a normal mood and affect  Not able to heel or toe walk not able to tandem walk.  Soft small mass in right upper medial thigh,( patient has several small "bumps" on her legs)        Assessment & Plan:  This is a 42 year old female with  1. Chronic pain syndrome  2. Polyneuropathy  3. Chronic foot ulcer  4. Diabetes  5. Anxiety, and depression  6. Closed sore under left great toe, swollen, redness around it, increased in temperature, Patient states, that she will follow up with the wound care center today, or at lest call them today for an appointment.  Plan :  Continue with medication, continue with exercising, advised patient to start Pilates with the DVD she owns, physical therapy was ordered at her last visit, she states, that she did not get a call from them to make an appointment.After we followed up on this , PT did not call her again, this time [name] was at the front, and stated that she will make sure , somebody will call the patient to schedule an appointment. The patient states, that she still did not get a call , did not start PT I encouraged her to continue with her walking and try to do this regularly.  Referral to hand surgery, for increased carpal tunnel symptoms 3 month ago, patient has not followed up on this. She states, that she has to reschedule an appointment for a sleep study and colonoscopy first, because of other issues, she did not reschedule those appointments. Discussed that she should see a psychologist, to help with her depression and being unmotivated.  Patient has watched the SCS DVD, and is very interested to do the SCS trial. She has medicaid,at this point I do not know whether somebody would do the trial for a patient who only has medicaid.  Medication refilled.  Follow up in 1 month

## 2012-11-25 ENCOUNTER — Ambulatory Visit: Payer: Medicaid Other | Admitting: Family Medicine

## 2012-11-27 ENCOUNTER — Ambulatory Visit (HOSPITAL_BASED_OUTPATIENT_CLINIC_OR_DEPARTMENT_OTHER): Payer: Medicaid Other | Attending: Family Medicine

## 2012-11-27 ENCOUNTER — Other Ambulatory Visit: Payer: Self-pay | Admitting: Family Medicine

## 2012-12-01 ENCOUNTER — Ambulatory Visit: Payer: Medicaid Other

## 2012-12-10 ENCOUNTER — Encounter (HOSPITAL_BASED_OUTPATIENT_CLINIC_OR_DEPARTMENT_OTHER): Payer: Medicaid Other

## 2012-12-11 ENCOUNTER — Encounter: Payer: Self-pay | Admitting: Family Medicine

## 2012-12-11 ENCOUNTER — Ambulatory Visit: Payer: Medicaid Other | Admitting: Family Medicine

## 2012-12-11 ENCOUNTER — Ambulatory Visit (INDEPENDENT_AMBULATORY_CARE_PROVIDER_SITE_OTHER): Payer: Medicaid Other | Admitting: Family Medicine

## 2012-12-11 ENCOUNTER — Other Ambulatory Visit (HOSPITAL_COMMUNITY)
Admission: RE | Admit: 2012-12-11 | Discharge: 2012-12-11 | Disposition: A | Discharge status: Home / Self Care | Payer: Medicaid Other | Source: Ambulatory Visit | Attending: Family Medicine | Admitting: Family Medicine

## 2012-12-11 VITALS — BP 102/78 | HR 72 | Temp 98.4°F | Ht 72.0 in | Wt 268.0 lb

## 2012-12-11 DIAGNOSIS — Z113 Encounter for screening for infections with a predominantly sexual mode of transmission: Secondary | ICD-10-CM | POA: Insufficient documentation

## 2012-12-11 DIAGNOSIS — D179 Benign lipomatous neoplasm, unspecified: Secondary | ICD-10-CM

## 2012-12-11 DIAGNOSIS — Z20828 Contact with and (suspected) exposure to other viral communicable diseases: Secondary | ICD-10-CM

## 2012-12-11 DIAGNOSIS — L97409 Non-pressure chronic ulcer of unspecified heel and midfoot with unspecified severity: Secondary | ICD-10-CM

## 2012-12-11 DIAGNOSIS — Z23 Encounter for immunization: Secondary | ICD-10-CM

## 2012-12-11 DIAGNOSIS — N912 Amenorrhea, unspecified: Secondary | ICD-10-CM

## 2012-12-11 DIAGNOSIS — L97519 Non-pressure chronic ulcer of other part of right foot with unspecified severity: Secondary | ICD-10-CM

## 2012-12-11 DIAGNOSIS — E1149 Type 2 diabetes mellitus with other diabetic neurological complication: Secondary | ICD-10-CM

## 2012-12-11 DIAGNOSIS — E781 Pure hyperglyceridemia: Secondary | ICD-10-CM

## 2012-12-11 DIAGNOSIS — F341 Dysthymic disorder: Secondary | ICD-10-CM

## 2012-12-11 DIAGNOSIS — N760 Acute vaginitis: Secondary | ICD-10-CM | POA: Insufficient documentation

## 2012-12-11 DIAGNOSIS — Z202 Contact with and (suspected) exposure to infections with a predominantly sexual mode of transmission: Secondary | ICD-10-CM

## 2012-12-11 LAB — POCT URINE PREGNANCY: Preg Test, Ur: NEGATIVE

## 2012-12-11 LAB — POCT GLYCOSYLATED HEMOGLOBIN (HGB A1C): Hemoglobin A1C: 5.9

## 2012-12-11 NOTE — Assessment & Plan Note (Signed)
Check lipid profile today. Previously elevated.

## 2012-12-11 NOTE — Assessment & Plan Note (Signed)
Previous ulcer at crease of 1st toe has now healed with slight crusting. Patient now with ulcer at base of 1st toe but very small 2-37mm in size x2 with dried picked at skin surrounding it. Patient states cannot afford diabetic shoes. Needs offloading. She opts to return to wound care center.

## 2012-12-11 NOTE — Assessment & Plan Note (Signed)
Screening phq9 of 14 and extremely difficult rating. Asked patient to return to discuss this.

## 2012-12-11 NOTE — Progress Notes (Signed)
[facility] Tana Conch, MD Phone: [phone]  Subjective:  Chief complaint-noted  # DIABETES Type II/foot ulcer Medications taking and tolerating-yes, metformin alone Diet-vegan Regular Exercise-completely sedentary   On Aspirin-yes On statin-yes Daily foot monitoring-yes, has noted small ulceration at base of right first toe. Patient states no fever/chills/expanding redness. She has picked aroudn the area on multiple occasions and removed dry skin. 2 very small open areas 2-48mm each.   ROS- Denies Polyuria,Polydipsia, nocturia, Vision changes. Denies  Hypoglycemia symptoms (shaky, sweaty, hungry, weak anxious, tremor, palpitations, confusion, behavior change).   Hemoglobin a1c:  Lab Results  Component Value Date   HGBA1C 5.9 12/11/2012   HGBA1C 6.6 08/14/2012   HGBA1C 6.6 06/14/2011   # Hypertriglyceridemia-no chest pain or shortness of breath. COmpliant with statin.   # Enlarging mass right thigh ? lipoma Has bene there for at least 3 years but slowly growing. No pain or tenderness. Seen by pain management and thought to be lipoma. DUe to growth, patient is concerned and would like removed  ROS--See HPI  Past Medical History Patient Active Problem List   Diagnosis Date Noted  . Essential hypertension, benign 08/14/2012    Priority: Medium  . Chronic pain 04/23/2011    Priority: Medium  . Diabetes Mellitus Type II, controlled but with neurological manifestations 08/08/2009    Priority: Medium  . DEPRESSION/ANXIETY 08/08/2009    Priority: Medium  . Chronic foot ulcer 06/14/2011  . Obesity, Class II, BMI 35-39.9, with comorbidity 02/10/2011  . Hypertriglyceridemia 12/06/2010  . Polyneuropathy 12/04/2010  . IBS 02/02/2010   Reviewed problem list.  Medications- reviewed and updated Current Outpatient Prescriptions on File Prior to Visit  Medication Sig Dispense Refill  . aspirin 81 MG tablet Take 81 mg by mouth daily.      Marland Kitchen b complex  vitamins capsule Take 1 capsule by mouth daily.      . clonazePAM (KLONOPIN) 2 MG tablet TAKE 1 TABLET BY MOUTH AT BEDTIME  30 tablet  0  . enalapril (VASOTEC) 20 MG tablet Take 1 tablet (20 mg total) by mouth daily.  90 tablet  3  . glucose blood test strip Check 3 times daily.  100 each  12  . Lancets (ACCU-CHEK MULTICLIX) lancets Check sugar 3 x daily  100 each  11  . metFORMIN (GLUCOPHAGE) 1000 MG tablet Take 1 tablet (1,000 mg total) by mouth 2 (two) times daily with a meal. Please ignore script for daily dosing. Supposed to be BID.  180 tablet  3  . morphine (MS CONTIN) 30 MG 12 hr tablet Take 1 tablet (30 mg total) by mouth 3 (three) times daily.  90 tablet  0  . PARoxetine (PAXIL) 40 MG tablet TAKE ONE AND ONE-HALF TABLETS BY MOUTH ONCE A DAY  45 tablet  11  . pravastatin (PRAVACHOL) 40 MG tablet Take 1 tablet (40 mg total) by mouth every evening.  90 tablet  3   No current facility-administered medications on file prior to visit.    Objective: BP 102/78  Pulse 72  Temp(Src) 98.4 F (36.9 C) (Oral)  Ht 6' (1.829 m)  Wt 268 lb (121.564 kg)  BMI 36.34 kg/m2  LMP 11/24/2012 Gen: NAD, resting comfortably on table CV: RRR no murmurs rubs or gallops Lungs: CTAB no crackles, wheeze, rhonchi Skin: warm, dry Neuro: grossly normal, moves all extremities Ext: right thigh with 6 x 6 cm mobile mass (not discrete like typical lipoma) Foot exam-base of 1st toe with 2-3  mm small ulceration with surrounding dry skin and on outside portion of dry skin 1x2 cm small ulceration.   Assessment/Plan:  Patient also had unprotected sex in last 3 months and >30 days since LMP. Upreg negative. STD testing performed. Labs completed from last visit as well.   # Enlarging mass right thigh ? Lipoma Patient requests surgical referral due to likely enlarging lipoma and desire for removal. I have placed this referral.

## 2012-12-11 NOTE — Assessment & Plan Note (Signed)
Well controlled. Continue current meds. Metformin alone.

## 2012-12-11 NOTE — Patient Instructions (Addendum)
1. For your labs, I will send you a letter if there are no medication changes needed. I will call you if we need to discuss your lab results. For example, if pregnancy test was positive, we would call.  2. We evaluated the area on your leg and we are going to send you to general surgery to consider removal.  3. Please go to the wound care center to follow up on your foot wound.  4. Diabetes looks great with a1c down to 5.9 from 6.6.   Health Maintenance Due  Topic Date Due  . Influenza Vaccine -today 10/24/2012   See me every 3 months at least. If able, I would also like for you to come back before then to discuss your depression.  Dr. Dayna Ramus you have a great day!

## 2012-12-12 LAB — HIV ANTIBODY (ROUTINE TESTING W REFLEX): HIV: NONREACTIVE

## 2012-12-12 NOTE — Progress Notes (Signed)
Referral was placed in [place] [facility] workqueue.  Darlis Wragg,CMA

## 2012-12-15 ENCOUNTER — Other Ambulatory Visit: Payer: Self-pay | Admitting: Physical Medicine and Rehabilitation

## 2012-12-17 ENCOUNTER — Telehealth: Payer: Self-pay

## 2012-12-17 NOTE — Telephone Encounter (Signed)
Patient is requesting a refill on Clonazepam. She said she would be out before her appt. on Friday.

## 2012-12-18 ENCOUNTER — Ambulatory Visit: Payer: Medicaid Other | Admitting: Obstetrics

## 2012-12-18 ENCOUNTER — Ambulatory Visit (INDEPENDENT_AMBULATORY_CARE_PROVIDER_SITE_OTHER): Payer: Medicaid Other | Admitting: Surgery

## 2012-12-18 MED ORDER — CLONAZEPAM 2 MG PO TABS: ORAL_TABLET | ORAL | Status: DC

## 2012-12-18 NOTE — Telephone Encounter (Signed)
Clonazepam refill called into [facility]. Left patient a voicemail to make her aware.

## 2012-12-19 ENCOUNTER — Encounter: Payer: Self-pay | Admitting: Physical Medicine and Rehabilitation

## 2012-12-19 ENCOUNTER — Encounter
Payer: Medicaid Other | Attending: Physical Medicine and Rehabilitation | Admitting: Physical Medicine and Rehabilitation

## 2012-12-19 VITALS — BP 151/80 | HR 101 | Resp 14 | Ht 72.0 in | Wt 270.0 lb

## 2012-12-19 DIAGNOSIS — Z5181 Encounter for therapeutic drug level monitoring: Secondary | ICD-10-CM

## 2012-12-19 DIAGNOSIS — G629 Polyneuropathy, unspecified: Secondary | ICD-10-CM

## 2012-12-19 DIAGNOSIS — M79609 Pain in unspecified limb: Secondary | ICD-10-CM | POA: Insufficient documentation

## 2012-12-19 DIAGNOSIS — Z79899 Other long term (current) drug therapy: Secondary | ICD-10-CM

## 2012-12-19 DIAGNOSIS — F3289 Other specified depressive episodes: Secondary | ICD-10-CM | POA: Insufficient documentation

## 2012-12-19 DIAGNOSIS — F329 Major depressive disorder, single episode, unspecified: Secondary | ICD-10-CM | POA: Insufficient documentation

## 2012-12-19 DIAGNOSIS — G5600 Carpal tunnel syndrome, unspecified upper limb: Secondary | ICD-10-CM | POA: Insufficient documentation

## 2012-12-19 DIAGNOSIS — M545 Low back pain, unspecified: Secondary | ICD-10-CM

## 2012-12-19 DIAGNOSIS — L97509 Non-pressure chronic ulcer of other part of unspecified foot with unspecified severity: Secondary | ICD-10-CM | POA: Insufficient documentation

## 2012-12-19 DIAGNOSIS — R209 Unspecified disturbances of skin sensation: Secondary | ICD-10-CM | POA: Insufficient documentation

## 2012-12-19 DIAGNOSIS — F411 Generalized anxiety disorder: Secondary | ICD-10-CM | POA: Insufficient documentation

## 2012-12-19 DIAGNOSIS — G609 Hereditary and idiopathic neuropathy, unspecified: Secondary | ICD-10-CM

## 2012-12-19 DIAGNOSIS — E1149 Type 2 diabetes mellitus with other diabetic neurological complication: Secondary | ICD-10-CM | POA: Insufficient documentation

## 2012-12-19 DIAGNOSIS — G894 Chronic pain syndrome: Secondary | ICD-10-CM | POA: Insufficient documentation

## 2012-12-19 MED ORDER — MORPHINE SULFATE ER 30 MG PO TBCR: 30.0000 mg | EXTENDED_RELEASE_TABLET | Freq: Three times a day (TID) | ORAL | Status: DC

## 2012-12-19 NOTE — Patient Instructions (Signed)
Continue with staying as active as tolerated 

## 2012-12-19 NOTE — Progress Notes (Signed)
Subjective:    Patient ID: [name], female    DOB: Apr 11, 1970, 42 y.o.   MRN: [id number]  HPI The patient complains about chronic hand and feet pain . The patient also complains about numbness and tingling in her hands and feet.  The problem has been stable.Except her carpal tunnel has gotten worse referred her to a hand surgeon 2 month ago, patient has not followed up. She has an appointment for a sleep study, and after this she has to have a colonoscopy, she did not go to her appointment and did not reschedule.  The patient reports that nobody from PT has called her again, Dr. Doroteo Bradford ordered PT 2 month ago.We talked to [name] about that at her last visit, and [name] stated that she will tell the PT office when she gets over there, the patient states today, that finally somebody from PT has called her .But she has no transportation at this time.   Pain Inventory Average Pain 6 Pain Right Now 6 My pain is constant, sharp, burning, dull, stabbing, tingling and aching  In the last 24 hours, has pain interfered with the following? General activity 10 Relation with others 10 Enjoyment of life 10 What TIME of day is your pain at its worst? evening Sleep (in general) Poor  Pain is worse with: walking, bending, standing and some activites Pain improves with: rest and medication Relief from Meds: 7  Mobility walk without assistance walk with assistance use a cane how many minutes can you walk? 10 ability to climb steps?  no do you drive?  no needs help with transfers Do you have any goals in this area?  yes  Function disabled: date disabled . I need assistance with the following:  bathing, household duties and shopping Do you have any goals in this area?  yes  Neuro/Psych trouble walking  Prior Studies Any changes since last visit?  no  Physicians involved in your care Any changes since last visit?  no   History reviewed. No pertinent family history. History    Social History  . Marital Status: Legally Separated    Spouse Name: N/A    Number of Children: N/A  . Years of Education: N/A   Social History Main Topics  . Smoking status: Never Smoker   . Smokeless tobacco: Never Used  . Alcohol Use: None  . Drug Use: None  . Sexual Activity: None   Other Topics Concern  . None   Social History Narrative  . None   Past Surgical History  Procedure Laterality Date  . Cholecystectomy    . Abdominoplasty     Past Medical History  Diagnosis Date  . 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) 08/08/2009  . DEPRESSION/ANXIETY 08/08/2009  . IBS 02/02/2010  . Polyneuropathy 12/04/2010   BP 151/80  Pulse 101  Resp 14  Ht 6' (1.829 m)  Wt 270 lb (122.471 kg)  BMI 36.61 kg/m2  SpO2 100%  LMP 11/24/2012     Review of Systems  Constitutional: Positive for diaphoresis.  Gastrointestinal: Positive for nausea, abdominal pain and diarrhea.  Genitourinary: Positive for difficulty urinating.  Musculoskeletal: Positive for gait problem.  All other systems reviewed and are negative.       Objective:   Physical Exam Constitutional: She is oriented to person, place, and time. She appears well-developed.  Neurological: She is alert and oriented to person, place, and time. She displays atrophy. A sensory deficit  is present. She exhibits normal muscle tone and strength grossly 5/5. Gait abnormal.  Reflex Scores:  Patellar reflexes are 2+ on the right side and 2+ on the left side.  Achilles reflexes trace on the right side and on the left side. Lateral foot intrinsic atrophy. No tibialis anterior atrophy noted The patient has decreased sensation to pinprick, light touch in both feet, proprioception ok bilateral. Pulses are normal  Skin: Skin is warm. No erythema.  Psychiatric: She has a normal mood and affect  Not able to heel or toe walk not able to tandem walk.  Soft  small mass in right upper medial thigh,( patient has several small "bumps" on her legs)        Assessment & Plan:  This is a 42 year old female with  1. Chronic pain syndrome  2. Polyneuropathy  3. Chronic foot ulcer  4. Diabetes  5. Anxiety, and depression    Plan :  Continue with medication, continue with exercising, advised patient to start Pilates with the DVD she owns, physical therapy was ordered at her last visit, she states, that she did not get a call from them to make an appointment.After we followed up on this , PT did call her, but at this time she has issues to get transportation to the PT office.  I encouraged her to continue with her walking and try to do this regularly.  Referral to hand surgery, for increased carpal tunnel symptoms 3 month ago, patient has not followed up on this. She states, that she has to reschedule an appointment for a sleep study and colonoscopy first, because of other issues, she did not reschedule those appointments. Discussed that she should see a psychologist, to help with her depression and being unmotivated.  Patient has watched the SCS DVD, and is very interested to do the SCS trial. She has medicaid,at this point I do not know whether somebody would do the trial for a patient who only has medicaid.  Medication refilled.  Follow up in 1 month

## 2012-12-22 ENCOUNTER — Telehealth: Payer: Self-pay | Admitting: Family Medicine

## 2012-12-22 NOTE — Telephone Encounter (Signed)
Pt wants lab test reorder such as lipid panel Please advise

## 2012-12-22 NOTE — Telephone Encounter (Signed)
Left message for pt to call back and make a lab appt.  Future order already in for lipid panel.  Dae Highley,CMA

## 2012-12-25 ENCOUNTER — Encounter (INDEPENDENT_AMBULATORY_CARE_PROVIDER_SITE_OTHER): Payer: Self-pay | Admitting: Surgery

## 2012-12-26 ENCOUNTER — Other Ambulatory Visit: Payer: Medicaid Other

## 2012-12-30 ENCOUNTER — Telehealth: Payer: Self-pay

## 2012-12-30 NOTE — Telephone Encounter (Signed)
Left message for patient to call office regarding her urine drug screen.

## 2012-12-30 NOTE — Telephone Encounter (Signed)
Message copied by Judd Gaudier on Tue Dec 30, 2012 12:20 PM ------      Message from: Su Monks      Created: Tue Dec 30, 2012 11:49 AM       Please ask patient for explanation ------

## 2013-01-01 ENCOUNTER — Telehealth: Payer: Self-pay

## 2013-01-01 NOTE — Telephone Encounter (Signed)
Tried to contact patient again regarding her urine drug screen.  Left message for her to return call.

## 2013-01-06 ENCOUNTER — Other Ambulatory Visit: Payer: Medicaid Other

## 2013-01-16 ENCOUNTER — Ambulatory Visit: Payer: Medicaid Other | Admitting: Physical Medicine & Rehabilitation

## 2013-01-20 ENCOUNTER — Encounter
Payer: Medicaid Other | Attending: Physical Medicine and Rehabilitation | Admitting: Physical Medicine and Rehabilitation

## 2013-01-20 ENCOUNTER — Encounter: Payer: Self-pay | Admitting: Physical Medicine and Rehabilitation

## 2013-01-20 VITALS — BP 131/77 | HR 103 | Resp 14 | Ht 72.0 in | Wt 273.0 lb

## 2013-01-20 DIAGNOSIS — F411 Generalized anxiety disorder: Secondary | ICD-10-CM | POA: Insufficient documentation

## 2013-01-20 DIAGNOSIS — G609 Hereditary and idiopathic neuropathy, unspecified: Secondary | ICD-10-CM | POA: Insufficient documentation

## 2013-01-20 DIAGNOSIS — E1140 Type 2 diabetes mellitus with diabetic neuropathy, unspecified: Secondary | ICD-10-CM

## 2013-01-20 DIAGNOSIS — E1142 Type 2 diabetes mellitus with diabetic polyneuropathy: Secondary | ICD-10-CM | POA: Insufficient documentation

## 2013-01-20 DIAGNOSIS — R209 Unspecified disturbances of skin sensation: Secondary | ICD-10-CM | POA: Insufficient documentation

## 2013-01-20 DIAGNOSIS — L97509 Non-pressure chronic ulcer of other part of unspecified foot with unspecified severity: Secondary | ICD-10-CM | POA: Insufficient documentation

## 2013-01-20 DIAGNOSIS — M79609 Pain in unspecified limb: Secondary | ICD-10-CM | POA: Insufficient documentation

## 2013-01-20 DIAGNOSIS — G5600 Carpal tunnel syndrome, unspecified upper limb: Secondary | ICD-10-CM | POA: Insufficient documentation

## 2013-01-20 DIAGNOSIS — F329 Major depressive disorder, single episode, unspecified: Secondary | ICD-10-CM | POA: Insufficient documentation

## 2013-01-20 DIAGNOSIS — G894 Chronic pain syndrome: Secondary | ICD-10-CM

## 2013-01-20 DIAGNOSIS — F3289 Other specified depressive episodes: Secondary | ICD-10-CM | POA: Insufficient documentation

## 2013-01-20 DIAGNOSIS — E1149 Type 2 diabetes mellitus with other diabetic neurological complication: Secondary | ICD-10-CM | POA: Insufficient documentation

## 2013-01-20 MED ORDER — MORPHINE SULFATE ER 30 MG PO TBCR: 30.0000 mg | EXTENDED_RELEASE_TABLET | Freq: Three times a day (TID) | ORAL | Status: DC

## 2013-01-20 NOTE — Patient Instructions (Signed)
Continue with your walking program. 

## 2013-01-20 NOTE — Progress Notes (Signed)
Subjective:    Patient ID: [name], female    DOB: 11-01-1970, 42 y.o.   MRN: [id number]  HPI The patient complains about chronic hand and feet pain . The patient also complains about numbness and tingling in her hands and feet.  The problem has been stable.Except her carpal tunnel has gotten worse referred her to a hand surgeon 2 month ago, patient has not followed up. She has an appointment for a sleep study, and after this she has to have a colonoscopy, she did not go to her appointment and did not reschedule.  The patient reports that nobody from PT has called her again, Dr. Doroteo Bradford ordered PT 2 month ago.We talked to [name] about that at her last visit, and [name] stated that she will tell the PT office when she gets over there, the patient states today, that finally somebody from PT has called her .But she has no transportation at this time.   Pain Inventory Average Pain 7 Pain Right Now 7 My pain is constant, sharp, burning, dull, stabbing, tingling and aching  In the last 24 hours, has pain interfered with the following? General activity 10 Relation with others 10 Enjoyment of life 10 What TIME of day is your pain at its worst? evening Sleep (in general) Poor  Pain is worse with: walking, bending, standing and some activites Pain improves with: medication Relief from Meds: 8  Mobility walk without assistance walk with assistance use a cane how many minutes can you walk? 5-10 ability to climb steps?  no do you drive?  no needs help with transfers transfers alone Do you have any goals in this area?  yes  Function disabled: date disabled . I need assistance with the following:  bathing, household duties and shopping Do you have any goals in this area?  yes  Neuro/Psych numbness spasms dizziness depression anxiety  Prior Studies Any changes since last visit?  no  Physicians involved in your care Any changes since last visit?  no   History  reviewed. No pertinent family history. History   Social History  . Marital Status: Legally Separated    Spouse Name: N/A    Number of Children: N/A  . Years of Education: N/A   Social History Main Topics  . Smoking status: Never Smoker   . Smokeless tobacco: Never Used  . Alcohol Use: None  . Drug Use: None  . Sexual Activity: None   Other Topics Concern  . None   Social History Narrative  . None   Past Surgical History  Procedure Laterality Date  . Cholecystectomy    . Abdominoplasty     Past Medical History  Diagnosis Date  . 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) 08/08/2009  . DEPRESSION/ANXIETY 08/08/2009  . IBS 02/02/2010  . Polyneuropathy 12/04/2010   BP 131/77  Pulse 103  Resp 14  Ht 6' (1.829 m)  Wt 273 lb (123.832 kg)  BMI 37.02 kg/m2  SpO2 97%     Review of Systems  Neurological: Positive for dizziness and numbness.  Psychiatric/Behavioral: Positive for dysphoric mood. The patient is nervous/anxious.   All other systems reviewed and are negative.       Objective:   Physical Exam Constitutional: She is oriented to person, place, and time. She appears well-developed.  Neurological: She is alert and oriented to person, place, and time. She displays atrophy. A sensory deficit is present. She exhibits normal muscle tone and  strength grossly 5/5. Gait abnormal.  Reflex Scores:  Patellar reflexes are 2+ on the right side and 2+ on the left side.  Achilles reflexes trace on the right side and on the left side. Lateral foot intrinsic atrophy. No tibialis anterior atrophy noted The patient has decreased sensation to pinprick, light touch in both feet, proprioception ok bilateral. Pulses are normal  Skin: Skin is warm. No erythema.  Psychiatric: She has a normal mood and affect  Not able to heel or toe walk not able to tandem walk.  Soft small mass in right upper medial thigh,(  patient has several small "bumps" on her legs)        Assessment & Plan:  This is a 42 year old female with  1. Chronic pain syndrome  2. Polyneuropathy  3. Chronic foot ulcer  4. Diabetes  5. Anxiety, and depression  Plan :  Continue with medication, continue with exercising, advised patient to start Pilates with the DVD she owns, physical therapy was ordered at her last visit, she states, that she did not get a call from them to make an appointment.After we followed up on this , PT did call her, but at this time she still has issues to get transportation to the PT office.  I encouraged her to continue with her walking and try to do this regularly.  Referral to hand surgery, for increased carpal tunnel symptoms 3 month ago, patient has not followed up on this. She states, that she has to reschedule an appointment for a sleep study and colonoscopy first, because of other issues, she did not reschedule those appointments. Discussed that she should see a psychologist, to help with her depression and being unmotivated.  The patient had an inconsistent UDS last time, Tramadol showed up in her UDS and she does not have a prescription, she states that her mother might have given her one of hers by accident. Talked to Dr. Doroteo Bradford about this,we will give her another chance, if she ever has any inconsistencies again, we will d/c her. Patient has watched the SCS DVD, and is very interested to do the SCS trial. She has medicaid,at this point I do not know whether somebody would do the trial for a patient who only has medicaid.  Medication refilled.  Follow up in 1 month

## 2013-01-23 ENCOUNTER — Telehealth: Payer: Self-pay

## 2013-01-23 NOTE — Telephone Encounter (Signed)
Ok, thanks for [facility], good job. I will tell her at her next visit that it would be very unlikely that it was a false positive

## 2013-01-23 NOTE — Telephone Encounter (Signed)
Patient called and wanted to speak with you. I told patient I would take a message- patient wanted to inform you that after researching on the Internet she found that Tizanidine could cause a "false-positive" result for Tramadol in her UDS. I called [employer] risk [employer] and a worker in Toxicology told me the test they run on all the UDS are very sensitive and accurate. He also said that it would be very unlikely for a false-positive to occur, and definitely not when patient had Tramadol and the metabolite in urine. Called stated "she just wanted to clear her name".

## 2013-01-29 ENCOUNTER — Other Ambulatory Visit: Payer: Medicaid Other

## 2013-02-17 ENCOUNTER — Encounter: Payer: Self-pay | Admitting: Physical Medicine and Rehabilitation

## 2013-02-17 ENCOUNTER — Encounter
Payer: Medicaid Other | Attending: Physical Medicine and Rehabilitation | Admitting: Physical Medicine and Rehabilitation

## 2013-02-17 VITALS — BP 126/73 | HR 97 | Resp 14 | Ht 72.0 in | Wt 272.2 lb

## 2013-02-17 DIAGNOSIS — F329 Major depressive disorder, single episode, unspecified: Secondary | ICD-10-CM | POA: Insufficient documentation

## 2013-02-17 DIAGNOSIS — G609 Hereditary and idiopathic neuropathy, unspecified: Secondary | ICD-10-CM | POA: Insufficient documentation

## 2013-02-17 DIAGNOSIS — Z5181 Encounter for therapeutic drug level monitoring: Secondary | ICD-10-CM

## 2013-02-17 DIAGNOSIS — E119 Type 2 diabetes mellitus without complications: Secondary | ICD-10-CM | POA: Insufficient documentation

## 2013-02-17 DIAGNOSIS — Z79899 Other long term (current) drug therapy: Secondary | ICD-10-CM

## 2013-02-17 DIAGNOSIS — F3289 Other specified depressive episodes: Secondary | ICD-10-CM | POA: Insufficient documentation

## 2013-02-17 DIAGNOSIS — F411 Generalized anxiety disorder: Secondary | ICD-10-CM | POA: Insufficient documentation

## 2013-02-17 DIAGNOSIS — G894 Chronic pain syndrome: Secondary | ICD-10-CM

## 2013-02-17 DIAGNOSIS — E1149 Type 2 diabetes mellitus with other diabetic neurological complication: Secondary | ICD-10-CM

## 2013-02-17 DIAGNOSIS — E1142 Type 2 diabetes mellitus with diabetic polyneuropathy: Secondary | ICD-10-CM

## 2013-02-17 DIAGNOSIS — L97509 Non-pressure chronic ulcer of other part of unspecified foot with unspecified severity: Secondary | ICD-10-CM | POA: Insufficient documentation

## 2013-02-17 MED ORDER — MORPHINE SULFATE ER 30 MG PO TBCR: 30.0000 mg | EXTENDED_RELEASE_TABLET | Freq: Three times a day (TID) | ORAL | Status: DC

## 2013-02-17 NOTE — Patient Instructions (Signed)
Try to stay as active as tolerated 

## 2013-02-17 NOTE — Progress Notes (Signed)
Subjective:    Patient ID: [name], female    DOB: 12/05/1970, 42 y.o.   MRN: [id number]  HPI The patient complains about chronic hand and feet pain . The patient also complains about numbness and tingling in her hands and feet.  The problem has been stable.Except her carpal tunnel has gotten worse referred her to a hand surgeon 2 month ago, patient has not followed up. She has an appointment for a sleep study, and after this she has to have a colonoscopy, she did not go to her appointment and did not reschedule.  The patient reports that nobody from PT has called her again, Dr. Doroteo Bradford ordered PT 2 month ago.We talked to [name] about that at her last visit, and [name] stated that she will tell the PT office when she gets over there, the patient states today, that finally somebody from PT has called her .But she has no transportation at this time.   Pain Inventory Average Pain 8 Pain Right Now 8 My pain is constant, sharp, burning, dull, stabbing, tingling and aching  In the last 24 hours, has pain interfered with the following? General activity 10 Relation with others 10 Enjoyment of life 10 What TIME of day is your pain at its worst? evening Sleep (in general) Poor  Pain is worse with: walking, bending, standing and some activites Pain improves with: rest, medication and massage Relief from Meds: 5  Mobility walk without assistance walk with assistance use a cane how many minutes can you walk? 5-10 ability to climb steps?  no do you drive?  no needs help with transfers transfers alone Do you have any goals in this area?  yes  Function disabled: date disabled 2005 I need assistance with the following:  bathing, meal prep, household duties and shopping Do you have any goals in this area?  yes  Neuro/Psych weakness numbness tremor tingling trouble walking spasms dizziness depression anxiety  Prior Studies Any changes since last visit?  no  Physicians  involved in your care Any changes since last visit?  no   History reviewed. No pertinent family history. History   Social History  . Marital Status: Legally Separated    Spouse Name: N/A    Number of Children: N/A  . Years of Education: N/A   Social History Main Topics  . Smoking status: Never Smoker   . Smokeless tobacco: Never Used  . Alcohol Use: None  . Drug Use: None  . Sexual Activity: None   Other Topics Concern  . None   Social History Narrative  . None   Past Surgical History  Procedure Laterality Date  . Cholecystectomy    . Abdominoplasty     Past Medical History  Diagnosis Date  . 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) 08/08/2009  . DEPRESSION/ANXIETY 08/08/2009  . IBS 02/02/2010  . Polyneuropathy 12/04/2010   BP 126/73  Pulse 97  Resp 14  Ht 6' (1.829 m)  Wt 272 lb 3.2 oz (123.469 kg)  BMI 36.91 kg/m2  SpO2 99%      Review of Systems  Constitutional: Positive for fever.  Gastrointestinal: Positive for nausea, vomiting, abdominal pain and diarrhea.  Neurological: Positive for tremors, weakness and numbness.       Spasms, tingling  Psychiatric/Behavioral: Positive for dysphoric mood. The patient is nervous/anxious.   All other systems reviewed and are negative.       Objective:   Physical Exam Constitutional:  She is oriented to person, place, and time. She appears well-developed.  Neurological: She is alert and oriented to person, place, and time. She displays atrophy. A sensory deficit is present. She exhibits normal muscle tone and strength grossly 5/5. Gait abnormal.  Reflex Scores:  Patellar reflexes are 2+ on the right side and 2+ on the left side.  Achilles reflexes trace on the right side and on the left side. Lateral foot intrinsic atrophy. No tibialis anterior atrophy noted The patient has decreased sensation to pinprick, light touch in both feet,  proprioception ok bilateral. Pulses are normal  Skin: Skin is warm. No erythema.  Psychiatric: She has a normal mood and affect  Not able to heel or toe walk not able to tandem walk.  Soft small mass in right upper medial thigh,( patient has several small "bumps" on her legs)        Assessment & Plan:  This is a 42 year old female with  1. Chronic pain syndrome  2. Polyneuropathy  3. Chronic foot ulcer  4. Diabetes  5. Anxiety, and depression  Plan :  Continue with medication, continue with exercising, advised patient to start Pilates with the DVD she owns, physical therapy was ordered at her last visit, she states, that she did not get a call from them to make an appointment.After we followed up on this , PT did call her, but at this time she still has issues to get transportation to the PT office.  I encouraged her to continue with her walking and try to do this regularly.  Referral to hand surgery, for increased carpal tunnel symptoms 3 month ago, patient has not followed up on this. She states, that she has to reschedule an appointment for a sleep study and colonoscopy first, because of other issues, she did not reschedule those appointments. Discussed that she should see a psychologist, to help with her depression and being unmotivated.  The patient had an inconsistent UDS last time, Tramadol showed up in her UDS and she does not have a prescription, she states that her mother might have given her one of hers by accident. Talked to Dr. Doroteo Bradford about this,we will give her another chance, if she ever has any inconsistencies again, we will d/c her.  Patient has watched the SCS DVD, and is very interested to do the SCS trial. She has medicaid,at this point I do not know whether somebody would do the trial for a patient who only has medicaid.  Medication refilled.  Follow up in 1 month

## 2013-02-23 ENCOUNTER — Telehealth: Payer: Self-pay

## 2013-02-23 NOTE — Telephone Encounter (Signed)
Chl Mychart After Visit Questionnaire    Question 02/19/2013 4:05 PM   How are you feeling after your recent visit? Still have pain   Does the recommended course of treatment seem to be helping your symptoms? Some but not all   Are you experiencing any side effects from your recommended treatment? Yes. Lethargy, depression.   is there anything else you would like to ask your physician? I would like to discuss getting something for breakthrough pain.     Please advise

## 2013-02-23 NOTE — Telephone Encounter (Signed)
We discussed this several times, she also talked to Dr. Doroteo Bradford about this before, at this time we decided not to increase her pain medication

## 2013-03-12 ENCOUNTER — Other Ambulatory Visit: Payer: Self-pay | Admitting: *Deleted

## 2013-03-12 ENCOUNTER — Other Ambulatory Visit: Payer: Self-pay

## 2013-03-12 ENCOUNTER — Other Ambulatory Visit: Payer: Self-pay | Admitting: Physical Medicine & Rehabilitation

## 2013-03-12 MED ORDER — MORPHINE SULFATE ER 30 MG PO TBCR: 30.0000 mg | EXTENDED_RELEASE_TABLET | Freq: Three times a day (TID) | ORAL | Status: DC

## 2013-03-12 MED ORDER — CLONAZEPAM 2 MG PO TABS: ORAL_TABLET | ORAL | Status: DC

## 2013-03-12 NOTE — Telephone Encounter (Signed)
RX printed early for controlled medication for the visit with RN on 03/23/13 (to be signed by MD) 

## 2013-03-13 ENCOUNTER — Telehealth: Payer: Self-pay | Admitting: *Deleted

## 2013-03-13 NOTE — Telephone Encounter (Signed)
LMOVM for pt to return call.  Please schedule for fasting labs to have her cholesterol checked. Fleeger, Maryjo Rochester

## 2013-03-16 ENCOUNTER — Encounter: Payer: Medicaid Other | Attending: Physical Medicine & Rehabilitation | Admitting: *Deleted

## 2013-03-16 ENCOUNTER — Encounter: Payer: Self-pay | Admitting: *Deleted

## 2013-03-16 ENCOUNTER — Telehealth: Payer: Self-pay

## 2013-03-16 VITALS — BP 136/63 | HR 89 | Resp 14

## 2013-03-16 DIAGNOSIS — G894 Chronic pain syndrome: Secondary | ICD-10-CM | POA: Insufficient documentation

## 2013-03-16 DIAGNOSIS — G629 Polyneuropathy, unspecified: Secondary | ICD-10-CM

## 2013-03-16 DIAGNOSIS — G8929 Other chronic pain: Secondary | ICD-10-CM

## 2013-03-16 DIAGNOSIS — L97509 Non-pressure chronic ulcer of other part of unspecified foot with unspecified severity: Secondary | ICD-10-CM | POA: Insufficient documentation

## 2013-03-16 DIAGNOSIS — G609 Hereditary and idiopathic neuropathy, unspecified: Secondary | ICD-10-CM | POA: Insufficient documentation

## 2013-03-16 DIAGNOSIS — E119 Type 2 diabetes mellitus without complications: Secondary | ICD-10-CM | POA: Insufficient documentation

## 2013-03-16 DIAGNOSIS — F341 Dysthymic disorder: Secondary | ICD-10-CM

## 2013-03-16 NOTE — Progress Notes (Signed)
Here for pill count and medication refills. MS Contin 30 mg # 90 Fill date 02/18/13   Today NV#10.  Pill count appropriate.  No change in pain levels or medication list.  Follow up in one month with RN for med refill and pill count.

## 2013-03-16 NOTE — Telephone Encounter (Signed)
Patient called requesting her morphine refill.  She says she will be out before her next appointment.  Left message for patient to have her come in for RN visit today at 1.

## 2013-03-16 NOTE — Patient Instructions (Signed)
Follow up one month with RN for pill count and med refill 

## 2013-04-09 ENCOUNTER — Other Ambulatory Visit: Payer: Self-pay | Admitting: *Deleted

## 2013-04-09 ENCOUNTER — Ambulatory Visit: Payer: Medicaid Other | Admitting: Obstetrics

## 2013-04-09 MED ORDER — MORPHINE SULFATE ER 30 MG PO TBCR: 30.0000 mg | EXTENDED_RELEASE_TABLET | Freq: Three times a day (TID) | ORAL | Status: DC

## 2013-04-09 NOTE — Telephone Encounter (Signed)
RX printed early for controlled medication for the visit with RN on 04/14/13 (to be signed by MD) 

## 2013-04-16 ENCOUNTER — Encounter: Payer: Medicaid Other | Attending: Physical Medicine and Rehabilitation

## 2013-04-16 ENCOUNTER — Encounter: Payer: Self-pay | Admitting: Physical Medicine & Rehabilitation

## 2013-04-16 ENCOUNTER — Ambulatory Visit (HOSPITAL_BASED_OUTPATIENT_CLINIC_OR_DEPARTMENT_OTHER): Payer: Medicaid Other | Admitting: Physical Medicine & Rehabilitation

## 2013-04-16 VITALS — BP 125/74 | HR 104 | Resp 14 | Ht 72.0 in | Wt 279.0 lb

## 2013-04-16 DIAGNOSIS — F411 Generalized anxiety disorder: Secondary | ICD-10-CM | POA: Insufficient documentation

## 2013-04-16 DIAGNOSIS — E119 Type 2 diabetes mellitus without complications: Secondary | ICD-10-CM | POA: Insufficient documentation

## 2013-04-16 DIAGNOSIS — G609 Hereditary and idiopathic neuropathy, unspecified: Secondary | ICD-10-CM | POA: Insufficient documentation

## 2013-04-16 DIAGNOSIS — F329 Major depressive disorder, single episode, unspecified: Secondary | ICD-10-CM | POA: Insufficient documentation

## 2013-04-16 DIAGNOSIS — L97509 Non-pressure chronic ulcer of other part of unspecified foot with unspecified severity: Secondary | ICD-10-CM | POA: Insufficient documentation

## 2013-04-16 DIAGNOSIS — F3289 Other specified depressive episodes: Secondary | ICD-10-CM | POA: Insufficient documentation

## 2013-04-16 DIAGNOSIS — Z79899 Other long term (current) drug therapy: Secondary | ICD-10-CM

## 2013-04-16 DIAGNOSIS — G894 Chronic pain syndrome: Secondary | ICD-10-CM | POA: Insufficient documentation

## 2013-04-16 DIAGNOSIS — G629 Polyneuropathy, unspecified: Secondary | ICD-10-CM

## 2013-04-16 DIAGNOSIS — Z5181 Encounter for therapeutic drug level monitoring: Secondary | ICD-10-CM

## 2013-04-16 NOTE — Patient Instructions (Signed)
Thyroid studies discuss with PCP  [employer] exercies may do circuit 15 reps, minimal rest break

## 2013-04-16 NOTE — Progress Notes (Signed)
Subjective:    Patient ID: [name], female    DOB: 1971/02/04, 43 y.o.   MRN: [id number]  HPI Returns today with more overall aches and pains. Complains of reducing her activity level slowly over time. No open areas on her feet but she notes increasing callouses on both feet. BMI elevated Pain Inventory Average Pain 8 Pain Right Now 8 My pain is constant, sharp, burning, dull, stabbing, tingling and aching  In the last 24 hours, has pain interfered with the following? General activity 10 Relation with others 10 Enjoyment of life 10 What TIME of day is your pain at its worst? evening Sleep (in general) Poor  Pain is worse with: walking, bending, sitting, standing and some activites Pain improves with: rest and medication Relief from Meds: 5  Mobility walk without assistance walk with assistance how many minutes can you walk? 5 ability to climb steps?  no do you drive?  no needs help with transfers Do you have any goals in this area?  yes  Function disabled: date disabled na I need assistance with the following:  dressing, bathing, meal prep, household duties and shopping Do you have any goals in this area?  yes  Neuro/Psych weakness numbness tremor tingling trouble walking spasms dizziness depression anxiety  Prior Studies Any changes since last visit?  no  Physicians involved in your care Any changes since last visit?  no   History reviewed. No pertinent family history. History   Social History  . Marital Status: Legally Separated    Spouse Name: N/A    Number of Children: N/A  . Years of Education: N/A   Social History Main Topics  . Smoking status: Never Smoker   . Smokeless tobacco: Never Used  . Alcohol Use: None  . Drug Use: None  . Sexual Activity: None   Other Topics Concern  . None   Social History Narrative  . None   Past Surgical History  Procedure Laterality Date  . Cholecystectomy    . Abdominoplasty     Past  Medical History  Diagnosis Date  . 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 DEPRESSION/ANXIETY 08/08/2009  . IBS 02/02/2010  . Polyneuropathy 12/04/2010   BP 125/74  Pulse 104  Resp 14  Ht 6' (1.829 m)  Wt 279 lb (126.554 kg)  BMI 37.83 kg/m2  SpO2 97%     Review of Systems  Gastrointestinal: Positive for nausea, abdominal pain and diarrhea.  Musculoskeletal: Positive for gait problem.  Skin: Positive for rash.  Neurological: Positive for tremors, weakness and numbness.       Tingling, sapsms  Psychiatric/Behavioral: Positive for dysphoric mood. The patient is nervous/anxious.   All other systems reviewed and are negative.       Objective:   Physical Exam  Reduced sensation to pinprick and light touch in both feet with relative sparing of the little toes. Motor strength is 2 minus toe flexion and extension 3 minus ankle dorsiflexion and extension 4/5 bilateral knee extension  Palpation for fibromyalgia tender points 3/18  Straight leg raising test is negative  Extremities show high arched feet with intrinsic atrophy. Heavy calluses on the plantar aspect of the metatarsal heads mainly towards the first MTP bilaterally also at the great toe IP joint right greater than left      Assessment & Plan:   1. Severe peripheral polyneuropathy of undetermined etiology. She has both motor and sensory symptoms.  This is a painful neuropathy. Skin checked looks good today however recommend podiatry for ongoing toenail and foot care  2. Diffuse body pain does not meet diagnostic criteria for fibromyalgia. I feel this is mainly due to reduced activity levels and that she would benefit from aquatic therapy or light resistance circuit training and including some stationary bicycling  Repeat urine drug screen today he had positive tramadol 3 or 4 months ago. Denies any tramadol use. We'll continue MS Contin  30mg  po BID #60

## 2013-04-22 ENCOUNTER — Ambulatory Visit: Payer: Medicaid Other | Admitting: Family Medicine

## 2013-05-14 ENCOUNTER — Encounter: Payer: Medicaid Other | Admitting: Family Medicine

## 2013-05-15 ENCOUNTER — Ambulatory Visit (HOSPITAL_BASED_OUTPATIENT_CLINIC_OR_DEPARTMENT_OTHER): Payer: Medicaid Other | Admitting: Physical Medicine & Rehabilitation

## 2013-05-15 ENCOUNTER — Encounter: Payer: Self-pay | Admitting: Physical Medicine & Rehabilitation

## 2013-05-15 ENCOUNTER — Encounter: Payer: Medicaid Other | Attending: Physical Medicine and Rehabilitation

## 2013-05-15 VITALS — BP 144/82 | HR 100 | Resp 14 | Ht 72.0 in | Wt 278.0 lb

## 2013-05-15 DIAGNOSIS — M79642 Pain in left hand: Secondary | ICD-10-CM

## 2013-05-15 DIAGNOSIS — F411 Generalized anxiety disorder: Secondary | ICD-10-CM | POA: Insufficient documentation

## 2013-05-15 DIAGNOSIS — G609 Hereditary and idiopathic neuropathy, unspecified: Secondary | ICD-10-CM | POA: Insufficient documentation

## 2013-05-15 DIAGNOSIS — G8929 Other chronic pain: Secondary | ICD-10-CM

## 2013-05-15 DIAGNOSIS — L97509 Non-pressure chronic ulcer of other part of unspecified foot with unspecified severity: Secondary | ICD-10-CM | POA: Insufficient documentation

## 2013-05-15 DIAGNOSIS — F329 Major depressive disorder, single episode, unspecified: Secondary | ICD-10-CM | POA: Insufficient documentation

## 2013-05-15 DIAGNOSIS — F3289 Other specified depressive episodes: Secondary | ICD-10-CM | POA: Insufficient documentation

## 2013-05-15 DIAGNOSIS — E1149 Type 2 diabetes mellitus with other diabetic neurological complication: Secondary | ICD-10-CM

## 2013-05-15 DIAGNOSIS — M79641 Pain in right hand: Secondary | ICD-10-CM

## 2013-05-15 DIAGNOSIS — E119 Type 2 diabetes mellitus without complications: Secondary | ICD-10-CM | POA: Insufficient documentation

## 2013-05-15 DIAGNOSIS — G894 Chronic pain syndrome: Secondary | ICD-10-CM | POA: Insufficient documentation

## 2013-05-15 DIAGNOSIS — M79609 Pain in unspecified limb: Secondary | ICD-10-CM

## 2013-05-15 MED ORDER — MORPHINE SULFATE ER 30 MG PO TBCR: 30.0000 mg | EXTENDED_RELEASE_TABLET | Freq: Three times a day (TID) | ORAL | Status: DC

## 2013-05-15 MED ORDER — TIZANIDINE HCL 2 MG PO TABS: 2.0000 mg | ORAL_TABLET | Freq: Two times a day (BID) | ORAL | Status: DC | PRN

## 2013-05-15 NOTE — Patient Instructions (Signed)
Tizanidine tablets or capsules What is this medicine? TIZANIDINE (tye ZAN i deen) helps to relieve muscle spasms. It may be used to help in the treatment of multiple sclerosis and spinal cord injury. This medicine may be used for other purposes; ask your health care provider or pharmacist if you have questions. COMMON BRAND NAME(S): Zanaflex What should I tell my health care provider before I take this medicine? They need to know if you have any of these conditions: -kidney disease -liver disease -low blood pressure -mental disorder -an unusual or allergic reaction to tizanidine, other medicines, lactose (tablets only), foods, dyes, or preservatives -pregnant or trying to get pregnant -breast-feeding How should I use this medicine? Take this medicine by mouth with a full glass of water. Take this medicine on an empty stomach, at least 30 minutes before or 2 hours after food. Do not take with food unless you talk with your doctor. Follow the directions on the prescription label. Take your medicine at regular intervals. Do not take your medicine more often than directed. Do not stop taking except on your doctor's advice. Suddenly stopping the medicine can be very dangerous. Talk to your pediatrician regarding the use of this medicine in children. Patients over 76 years old may have a stronger reaction and need a smaller dose. Overdosage: If you think you have taken too much of this medicine contact a poison control center or emergency room at once. NOTE: This medicine is only for you. Do not share this medicine with others. What if I miss a dose? If you miss a dose, take it as soon as you can. If it is almost time for your next dose, take only that dose. Do not take double or extra doses. What may interact with this medicine? Do not take this medicine with any of the following medications: -ciprofloxacin -clonidine -fluvoxamine -guanabenz -guanfacine -methyldopa This medicine may also  interact with the following medications: -acyclovir -alcohol -antihistamines -baclofen -barbiturates like phenobarbital -benzodiazepines -cimetidine -famotidine -female hormones, like estrogens or progestins and birth control pills -medicines for high blood pressure -medicines for irregular heartbeat -medicines for pain like codeine, morphine, and hydrocodone -medicines for sleep -rofecoxib -some antibiotics like levofloxacin, ofloxacin -ticlopidine -zileuton This list may not describe all possible interactions. Give your health care provider a list of all the medicines, herbs, non-prescription drugs, or dietary supplements you use. Also tell them if you smoke, drink alcohol, or use illegal drugs. Some items may interact with your medicine. What should I watch for while using this medicine? You may get drowsy or dizzy. Do not drive, use machinery, or do anything that needs mental alertness until you know how this medicine affects you. Do not stand or sit up quickly, especially if you are an older patient. This reduces the risk of dizzy or fainting spells. Alcohol may interfere with the effect of this medicine. Avoid alcoholic drinks. Your mouth may get dry. Chewing sugarless gum or sucking hard candy, and drinking plenty of water may help. Contact your doctor if the problem does not go away or is severe. What side effects may I notice from receiving this medicine? Side effects that you should report to your doctor or health care professional as soon as possible: -allergic reactions like skin rash, itching or hives, swelling of the face, lips, or tongue -blurred vision -fainting spells -hallucinations -nausea or vomiting -nervousness -redness, blistering, peeling or loosening of the skin, including inside the mouth -slow or irregular heartbeat, palpitations, or chest pain -yellowing of  the skin or eyes Side effects that usually do not require medical attention (report to your doctor  or health care professional if they continue or are bothersome): -dizziness -drowsiness -dry mouth -tiredness or weakness This list may not describe all possible side effects. Call your doctor for medical advice about side effects. You may report side effects to FDA at 1-800-FDA-1088. Where should I keep my medicine? Keep out of the reach of children. Store at room temperature between 15 and 30 degrees C (59 and 86 degrees F). Throw away any unused medicine after the expiration date. NOTE: This sheet is a summary. It may not cover all possible information. If you have questions about this medicine, talk to your doctor, pharmacist, or health care provider.  2014, Elsevier/Gold Standard. (2007-11-27 12:38:02)  as-needed basis up to twice a day do not take together with Klonopin due to excessive sedation

## 2013-05-15 NOTE — Progress Notes (Signed)
Subjective:    Patient ID: [name], female    DOB: 07-28-1970, 43 y.o.   MRN: [id number]  HPI Patient with abdominal discomfort and diarrhea. History of IBS has followup with gastroenterologist next month.  Burning bilateral foot pain Has aching in both arms, no trauma to that area The arm pain seems different than the leg pain. It involves the hands as well as the forearms and even above the elbow. No neck pain. She gets occasional jumping or twitching of her legs. Increasing numbness and pain  Pain Inventory Average Pain 9 Pain Right Now 10 My pain is constant, sharp, burning, dull, stabbing, tingling, aching and other  In the last 24 hours, has pain interfered with the following? General activity 10 Relation with others 10 Enjoyment of life 10 What TIME of day is your pain at its worst? evening, night Sleep (in general) Poor  Pain is worse with: walking, bending, sitting, standing and some activites Pain improves with: medication Relief from Meds: 5  Mobility walk without assistance walk with assistance use a cane how many minutes can you walk? 5 ability to climb steps?  no do you drive?  no needs help with transfers Do you have any goals in this area?  yes  Function disabled: date disabled na I need assistance with the following:  bathing, meal prep, household duties and shopping Do you have any goals in this area?  yes  Neuro/Psych weakness numbness tremor tingling trouble walking spasms dizziness confusion depression anxiety  Prior Studies Any changes since last visit?  no  Physicians involved in your care Any changes since last visit?  no   History reviewed. No pertinent family history. History   Social History  . Marital Status: Legally Separated    Spouse Name: N/A    Number of Children: N/A  . Years of Education: N/A   Social History Main Topics  . Smoking status: Never Smoker   . Smokeless tobacco: Never Used  .  Alcohol Use: None  . Drug Use: None  . Sexual Activity: None   Other Topics Concern  . None   Social History Narrative  . None   Past Surgical History  Procedure Laterality Date  . Cholecystectomy    . Abdominoplasty     Past Medical History  Diagnosis Date  . 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 DEPRESSION/ANXIETY 08/08/2009  . IBS 02/02/2010  . Polyneuropathy 12/04/2010   BP 144/82  Pulse 100  Resp 14  Ht 6' (1.829 m)  Wt 278 lb (126.1 kg)  BMI 37.70 kg/m2  SpO2 98%  Opioid Risk Score:   Fall Risk Score: High Fall Risk (>13 points) (pt educated on fall risk, pt already given brochure)   Review of Systems  Musculoskeletal: Positive for gait problem.  Neurological: Positive for tremors, weakness and numbness.       Tingling, spasms  Psychiatric/Behavioral: Positive for confusion and dysphoric mood. The patient is nervous/anxious.   All other systems reviewed and are negative.       Objective:   Physical Exam Decreased sensation to pinprick below the knees bilateral  Decreased proprioception right great toe intact at the ankle Normal proprioception left great toe and ankle Skin on the feet shows no evidence of breakdown. There is calcification on the plantar surface as well as around the great toe plantar surface area  Upper extremity sensation reduced in the median nerve distribution on  the right. On the left the entire hand has decreased pinprick sensation  Deep tendon reflexes are absent at the knees and ankles.     Assessment & Plan:  1. Severe peripheral polyneuropathy of undetermined etiology. She has both motor and sensory symptoms. This is a painful neuropathy. Skin checked looks good today however recommend podiatry for ongoing toenail and foot care  2. bilateral progressive upper extremity pain. She has a known history of carpal tunnel however since her polyneuropathy  has progressed to the knees it is also possible that this is starting to affect the upper extremities. We'll need to repeat EMG and CV to further assess Repeat urine drug screen was consistent  We'll continue MS Contin 30mg  po BID #60  Add tizanidine 2mg  BID, patient tries him from her mother and this was beneficial for some of her muscle twitching. We talked about potential risks and side effects Over half of the 25 min visit was spent counseling and coordinating care.

## 2013-05-21 ENCOUNTER — Ambulatory Visit: Payer: Medicaid Other | Admitting: Obstetrics

## 2013-06-05 ENCOUNTER — Other Ambulatory Visit: Payer: Self-pay | Admitting: *Deleted

## 2013-06-05 MED ORDER — MORPHINE SULFATE ER 30 MG PO TBCR: 30.0000 mg | EXTENDED_RELEASE_TABLET | Freq: Three times a day (TID) | ORAL | Status: DC

## 2013-06-05 NOTE — Telephone Encounter (Signed)
RX printed for MD to sign for RN visit 06/09/13 

## 2013-06-10 ENCOUNTER — Telehealth: Payer: Self-pay | Admitting: Family Medicine

## 2013-06-10 NOTE — Telephone Encounter (Signed)
Spoke with patient.  She has old orders from May 2014.  They are CBC, BMET and LIPIDs.  Advised I would message MD and see if he still wanted these or wanted to add anything else.   She gave me permission to leave a detailed message when I call back. [name], Salome Spotted

## 2013-06-10 NOTE — Telephone Encounter (Signed)
Pt would like to have her blood work done prior to her appt 3/21. She says there was an order for blood work to be done sometime ago and it was never done. She would like a nurse to call to discuss this

## 2013-06-12 ENCOUNTER — Encounter: Payer: Self-pay | Admitting: *Deleted

## 2013-06-12 ENCOUNTER — Other Ambulatory Visit: Payer: Self-pay | Admitting: Physical Medicine & Rehabilitation

## 2013-06-12 ENCOUNTER — Encounter: Payer: Medicaid Other | Attending: Physical Medicine & Rehabilitation | Admitting: *Deleted

## 2013-06-12 VITALS — BP 152/75 | HR 98 | Resp 14

## 2013-06-12 DIAGNOSIS — G629 Polyneuropathy, unspecified: Secondary | ICD-10-CM

## 2013-06-12 DIAGNOSIS — E1142 Type 2 diabetes mellitus with diabetic polyneuropathy: Secondary | ICD-10-CM | POA: Insufficient documentation

## 2013-06-12 DIAGNOSIS — E1149 Type 2 diabetes mellitus with other diabetic neurological complication: Secondary | ICD-10-CM | POA: Insufficient documentation

## 2013-06-12 DIAGNOSIS — G5600 Carpal tunnel syndrome, unspecified upper limb: Secondary | ICD-10-CM | POA: Insufficient documentation

## 2013-06-12 DIAGNOSIS — G8929 Other chronic pain: Secondary | ICD-10-CM

## 2013-06-12 NOTE — Patient Instructions (Signed)
Follow up with Dr Letta Pate 07/10/13

## 2013-06-12 NOTE — Progress Notes (Signed)
Here for pill count and medication refills.MS Contin 30 mg # 90   Fill date  05/15/13   Today NV# 5  VSS   Addley has had no recent falls though she has had some in the past.  I have given her the handout for fall prevention in the home.  Her pill count was appropriate and  Refill was given.  She said that the muscle relaxer Dr Letta Pate gave her last has really helped.She will return next month for an appt with Dr Letta Pate

## 2013-06-12 NOTE — Telephone Encounter (Signed)
LM for pt.  Informed her to make a lab appt to get this done. Jazmin Hartsell,CMA

## 2013-06-12 NOTE — Telephone Encounter (Signed)
Those labs are fine as previously ordered. Do not see clear reason for additional labs although they may be found during next visit.

## 2013-06-23 ENCOUNTER — Ambulatory Visit: Payer: Medicaid Other | Admitting: Family Medicine

## 2013-07-02 ENCOUNTER — Other Ambulatory Visit: Payer: Self-pay

## 2013-07-03 ENCOUNTER — Other Ambulatory Visit: Payer: Self-pay

## 2013-07-03 MED ORDER — MORPHINE SULFATE ER 30 MG PO TBCR: 30.0000 mg | EXTENDED_RELEASE_TABLET | Freq: Three times a day (TID) | ORAL | Status: DC

## 2013-07-03 NOTE — Telephone Encounter (Signed)
Morphine rx printed for 07/07/2013 appointment with nurse practitioner. Provider needs to sign due to medicaid.

## 2013-07-07 ENCOUNTER — Encounter: Payer: Medicaid Other | Admitting: Registered Nurse

## 2013-07-10 ENCOUNTER — Encounter: Payer: Medicaid Other | Attending: Physical Medicine & Rehabilitation | Admitting: *Deleted

## 2013-07-10 ENCOUNTER — Encounter: Payer: Self-pay | Admitting: *Deleted

## 2013-07-10 ENCOUNTER — Other Ambulatory Visit: Payer: Medicaid Other

## 2013-07-10 ENCOUNTER — Ambulatory Visit: Payer: Medicaid Other | Admitting: Physical Medicine & Rehabilitation

## 2013-07-10 VITALS — BP 144/78 | HR 94 | Resp 14

## 2013-07-10 DIAGNOSIS — E1149 Type 2 diabetes mellitus with other diabetic neurological complication: Secondary | ICD-10-CM | POA: Insufficient documentation

## 2013-07-10 DIAGNOSIS — G5600 Carpal tunnel syndrome, unspecified upper limb: Secondary | ICD-10-CM | POA: Insufficient documentation

## 2013-07-10 DIAGNOSIS — G8929 Other chronic pain: Secondary | ICD-10-CM

## 2013-07-10 DIAGNOSIS — G629 Polyneuropathy, unspecified: Secondary | ICD-10-CM

## 2013-07-10 DIAGNOSIS — E1142 Type 2 diabetes mellitus with diabetic polyneuropathy: Secondary | ICD-10-CM | POA: Insufficient documentation

## 2013-07-10 MED ORDER — TIZANIDINE HCL 2 MG PO TABS: 2.0000 mg | ORAL_TABLET | Freq: Two times a day (BID) | ORAL | Status: DC | PRN

## 2013-07-10 NOTE — Progress Notes (Signed)
Here for pill count and medication refills. MS Contin 30 mg #90 Fill date 06/14/13   Today NV#  9  Pill count appropriate.  Refill given.  I electronically sent refill on tizanidine to pharmacy.  She has had another fall 2 days ago.  She did not injure herself but says her knees are a little scuffed up. She was educated on fall risks and a handout given for fall prevention in the home at previous visit.  She will return in one month for an appointment already scheduled with Dr Letta Pate 08/11/13.

## 2013-07-10 NOTE — Patient Instructions (Signed)
Fu DR Letta Pate 08/11/13

## 2013-07-20 ENCOUNTER — Ambulatory Visit: Payer: Medicaid Other | Admitting: Registered Nurse

## 2013-08-04 ENCOUNTER — Other Ambulatory Visit: Payer: Self-pay | Admitting: Family Medicine

## 2013-08-11 ENCOUNTER — Ambulatory Visit: Payer: Medicaid Other | Admitting: Physical Medicine & Rehabilitation

## 2013-08-13 ENCOUNTER — Encounter: Payer: Medicaid Other | Attending: Physical Medicine & Rehabilitation | Admitting: Registered Nurse

## 2013-08-13 ENCOUNTER — Encounter: Payer: Self-pay | Admitting: Registered Nurse

## 2013-08-13 VITALS — BP 145/78 | HR 99 | Resp 14 | Ht 72.0 in | Wt 283.0 lb

## 2013-08-13 DIAGNOSIS — M79609 Pain in unspecified limb: Secondary | ICD-10-CM

## 2013-08-13 DIAGNOSIS — E1142 Type 2 diabetes mellitus with diabetic polyneuropathy: Secondary | ICD-10-CM | POA: Insufficient documentation

## 2013-08-13 DIAGNOSIS — G609 Hereditary and idiopathic neuropathy, unspecified: Secondary | ICD-10-CM

## 2013-08-13 DIAGNOSIS — G629 Polyneuropathy, unspecified: Secondary | ICD-10-CM

## 2013-08-13 DIAGNOSIS — F32A Depression, unspecified: Secondary | ICD-10-CM

## 2013-08-13 DIAGNOSIS — Z5181 Encounter for therapeutic drug level monitoring: Secondary | ICD-10-CM

## 2013-08-13 DIAGNOSIS — F329 Major depressive disorder, single episode, unspecified: Secondary | ICD-10-CM

## 2013-08-13 DIAGNOSIS — E1149 Type 2 diabetes mellitus with other diabetic neurological complication: Secondary | ICD-10-CM | POA: Insufficient documentation

## 2013-08-13 DIAGNOSIS — M79641 Pain in right hand: Secondary | ICD-10-CM

## 2013-08-13 DIAGNOSIS — G8929 Other chronic pain: Secondary | ICD-10-CM

## 2013-08-13 DIAGNOSIS — M79642 Pain in left hand: Secondary | ICD-10-CM

## 2013-08-13 DIAGNOSIS — Z79899 Other long term (current) drug therapy: Secondary | ICD-10-CM

## 2013-08-13 DIAGNOSIS — G5600 Carpal tunnel syndrome, unspecified upper limb: Secondary | ICD-10-CM | POA: Insufficient documentation

## 2013-08-13 DIAGNOSIS — F3289 Other specified depressive episodes: Secondary | ICD-10-CM

## 2013-08-13 MED ORDER — MORPHINE SULFATE ER 30 MG PO TBCR: 30.0000 mg | EXTENDED_RELEASE_TABLET | Freq: Three times a day (TID) | ORAL | Status: DC

## 2013-08-13 MED ORDER — TIZANIDINE HCL 2 MG PO TABS: 2.0000 mg | ORAL_TABLET | Freq: Three times a day (TID) | ORAL | Status: DC

## 2013-08-13 NOTE — Progress Notes (Signed)
Subjective:    Patient ID: [name], female    DOB: 1970-04-05, 43 y.o.   MRN: [id number]  HPI: [name] is a 43 year old female who returns for follow up for chronic pain and medication refill. She says her pain is located in her hands, legs and feet. She has been having spasms and states " the zanaflex helps tremendously dose changed to three times a week. She rates her pain 8. She doesn't follow a exercise regime, she walks in her house, mailbox and does her own groceries. She has been encouraged to become more active she lives a sedentary lifestyle. She missed her EMG appointment with Dr. Letta Pate due to chronic diarrhea she will reschedule, Also missed her colonoscopy appointment and will reschedule. She was encouraged to F/U with her GI issues especially with family history of colon cancer, she verbalized understanding. She admits she is more depressed with her overall life and condition, spoke to her about seeking help at [facility] pamphlet give. Emotional support given. She verbalizes understanding. She denies wanting to harm herself or others. Pain Inventory Average Pain 8 Pain Right Now 8 My pain is constant, sharp, burning, dull, stabbing, tingling and aching  In the last 24 hours, has pain interfered with the following? General activity 10 Relation with others 10 Enjoyment of life 10 What TIME of day is your pain at its worst? evening, night Sleep (in general) Poor  Pain is worse with: walking, bending, sitting, standing and some activites Pain improves with: medication Relief from Meds: 6  Mobility walk without assistance walk with assistance use a cane how many minutes can you walk? 5 ability to climb steps?  no do you drive?  no needs help with transfers Do you have any goals in this area?  yes  Function disabled: date disabled na I need assistance with the following:  bathing, household duties and shopping Do you have any goals  in this area?  yes  Neuro/Psych weakness numbness tremor tingling trouble walking spasms dizziness depression anxiety  Prior Studies Any changes since last visit?  no  Physicians involved in your care Any changes since last visit?  no   History reviewed. No pertinent family history. History   Social History  . Marital Status: Legally Separated    Spouse Name: N/A    Number of Children: N/A  . Years of Education: N/A   Social History Main Topics  . Smoking status: Never Smoker   . Smokeless tobacco: Never Used  . Alcohol Use: None  . Drug Use: None  . Sexual Activity: None   Other Topics Concern  . None   Social History Narrative  . None   Past Surgical History  Procedure Laterality Date  . Cholecystectomy    . Abdominoplasty     Past Medical History  Diagnosis Date  . 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 DEPRESSION/ANXIETY 08/08/2009  . IBS 02/02/2010  . Polyneuropathy 12/04/2010   BP 145/78  Pulse 99  Resp 14  Ht 6' (1.829 m)  Wt 283 lb (128.368 kg)  BMI 38.37 kg/m2  SpO2 100%  Opioid Risk Score:   Fall Risk Score: High Fall Risk (>13 points) (pt educated on fall risk, brochure given to pt previously)    Review of Systems  Constitutional: Positive for fever, chills and diaphoresis.  Gastrointestinal: Positive for nausea, vomiting, abdominal pain and diarrhea.  Musculoskeletal: Positive  for back pain.  Neurological: Positive for tremors, weakness and numbness.       Tingling, spasms  Psychiatric/Behavioral: The patient is nervous/anxious.        Depression  All other systems reviewed and are negative.      Objective:   Physical Exam  Nursing note and vitals reviewed. Constitutional: She is oriented to person, place, and time. She appears well-developed and well-nourished.  Obese  HENT:  Head: Normocephalic and atraumatic.  Neck: Normal range of motion.  Neck supple.  Cardiovascular: Normal rate, regular rhythm and normal heart sounds.   Pulmonary/Chest: Effort normal and breath sounds normal.  Musculoskeletal:  Normal Muscle Bulk and Muscle Testing reveals: Upper and Lower Extremities Full ROM and Muscle Strength 5/5. Back without spinal or paraspinal tenderness noted. Arises from chair with ease. Narrow based gait/ walks with straight Cane.   Neurological: She is alert and oriented to person, place, and time.  Skin: Skin is warm and dry.  Psychiatric: She has a normal mood and affect.  Affect flat          Assessment & Plan:  1. Severe peripheral polyneuropathy of undetermined etiology.: Refilled: MS Contin 30 mg one tablet three times a day#90 2. Bilateral progressive upper extremity pain. She has a known history of carpal tunnel: Missed her EMG appointment. She will reschedule today. Continue with current medication regime. 3. Muscle Spasms: Zanaflex has been effective, will increase to 3 x a day.  4. Depression: Pamphlet given for [facility]: Encouraged Counseling: She was open to the suggestion and emotional support given.  30 minutes of face to face patient care time spent during this visit. All questions were encouraged and answered.   F/U in 1 month

## 2013-08-20 ENCOUNTER — Ambulatory Visit (INDEPENDENT_AMBULATORY_CARE_PROVIDER_SITE_OTHER): Payer: Medicaid Other | Admitting: Family Medicine

## 2013-08-20 VITALS — Temp 98.3°F | Wt 274.0 lb

## 2013-08-20 DIAGNOSIS — E781 Pure hyperglyceridemia: Secondary | ICD-10-CM

## 2013-08-20 DIAGNOSIS — R109 Unspecified abdominal pain: Secondary | ICD-10-CM

## 2013-08-20 DIAGNOSIS — E119 Type 2 diabetes mellitus without complications: Secondary | ICD-10-CM

## 2013-08-20 DIAGNOSIS — K589 Irritable bowel syndrome without diarrhea: Secondary | ICD-10-CM

## 2013-08-20 LAB — CBC
HCT: 44.3 % (ref 36.0–46.0)
Hemoglobin: 15.4 g/dL — ABNORMAL HIGH (ref 12.0–15.0)
MCH: 28.9 pg (ref 26.0–34.0)
MCHC: 34.8 g/dL (ref 30.0–36.0)
MCV: 83.1 fL (ref 78.0–100.0)
PLATELETS: 367 10*3/uL (ref 150–400)
RBC: 5.33 MIL/uL — ABNORMAL HIGH (ref 3.87–5.11)
RDW: 15 % (ref 11.5–15.5)
WBC: 10.9 10*3/uL — ABNORMAL HIGH (ref 4.0–10.5)

## 2013-08-20 LAB — LIPASE: LIPASE: 33 U/L (ref 0–75)

## 2013-08-20 LAB — COMPREHENSIVE METABOLIC PANEL
ALBUMIN: 4.6 g/dL (ref 3.5–5.2)
ALK PHOS: 46 U/L (ref 39–117)
ALT: 25 U/L (ref 0–35)
AST: 25 U/L (ref 0–37)
BILIRUBIN TOTAL: 0.5 mg/dL (ref 0.2–1.2)
BUN: 12 mg/dL (ref 6–23)
CO2: 19 mEq/L (ref 19–32)
Calcium: 10.2 mg/dL (ref 8.4–10.5)
Chloride: 98 mEq/L (ref 96–112)
Creat: 0.67 mg/dL (ref 0.50–1.10)
GLUCOSE: 139 mg/dL — AB (ref 70–99)
POTASSIUM: 4.4 meq/L (ref 3.5–5.3)
SODIUM: 135 meq/L (ref 135–145)
Total Protein: 7.7 g/dL (ref 6.0–8.3)

## 2013-08-20 LAB — LIPID PANEL
Cholesterol: 238 mg/dL — ABNORMAL HIGH (ref 0–200)
HDL: 38 mg/dL — ABNORMAL LOW (ref >39)
Total CHOL/HDL Ratio: 6.3 Ratio
Triglycerides: 1085 mg/dL — ABNORMAL HIGH (ref <150)

## 2013-08-20 LAB — POCT GLYCOSYLATED HEMOGLOBIN (HGB A1C): HEMOGLOBIN A1C: 6.8

## 2013-08-20 NOTE — Patient Instructions (Addendum)
I am sorry you are dealing with worsening abdominal pain. I am going to send my thoughts including labs to Dr. Oletta Lamas once available. I am worried about your narcotics as well as your diabetes  For your labs, I will send you a letter if there are no medication changes needed. I will call you if we need to discuss your lab results.  Orders Placed This Encounter  Procedures  . CBC  . Comprehensive metabolic panel  . Lipid panel  . Lipase  . POCT glycosylated hemoglobin (Hb A1C)   We need to speak more about your diabetes as well as your chronic concerns of tinnitus and tingling in R eye. We will likely need 2 separate visits to cover these as well as other  Health Maintenance Due  Topic Date Due  . Pap Smear  01/02/2013  . Hemoglobin A1c  06/10/2013  . Foot Exam  08/14/2013   Thanks, Dr. Yong Channel

## 2013-08-20 NOTE — Assessment & Plan Note (Signed)
I suspect chronic diarrhea may be IBS related but will ask for Dr. Oletta Lamas input on this subject. DId order lipase, cmet, cbc today to evaluate for potential causes. Also hypertriglyceridemia places at risk for pancreatitis but i doubt that diagnosis at this time. With worsening abdominal pain and nausea I am primarily concerned about narcotic bowel syndrome as well as possible diabetic gastroparesis. Would think gastric emptying study would be useful but doubt could get patient off narcotics for this to be helpful. Escalating doses of narcotics are surely not beneficial to patient in this area though and would ask for GI's input on if we need to deescalate through pain managements help to help improve bowel function and chronic pain of GI region. Will have this note as well as labs faxed when available and await follow up with GI.

## 2013-08-20 NOTE — Progress Notes (Signed)
Garret Reddish, MD Phone: [phone]  Subjective:   [name] is a 43 y.o. year old female patient (who has unfortunately cancelled on same day or no showed to last 3 appointments at least due to chronic pain and social phobia and desire to not leave the house) who presents with the following:  Chronic Abdominal Pain and Diarrhea  Patient admits today to me that she does not carry a formal diagnosis of IBS from a physician but instead that she gave herself this diagnosis years ago. SHe had previously told me a prior doctor had diagnosed her. She does states she had a colonoscopy within last 10-15 years by Dr. Oletta Lamas of [facility] and that this was normal. SHe missed one follow up appointment and is scheduled to see him during the first week of June. She states she has had diarrhea een from her teenage years. She states ypically it is 1-2 x a day but recently it has been 5x a day over last 2 months or so. She has had no diet changes. She also has worsening abdominal pain to left in middle of left side of abdomen that will catch her for 30 seconds at least once a day if not several times. She also has pain "over her ribs" on bilateral saides. THe pain on the left side of abdomen rated as severe 8-9/10 pain. It has been progressing over last month but has been a chronic issue. She states she has chronic nausea which makes her eat less but does not vomit.   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 She mentiones to me that pain management doctor may go up on her medicine due to her neuropathy. She also carried diabetes diagnosis for 10-15 years. Her symptoms have worsened on narcotics.   Patient also requests annual labs today.   ROS- no fever/chills/vomiting. No melena or BRBPR. Denies any constipation in last 10 years.  Family History-colon cancer in grandmother (not at young age)  Past Medical History- Patient Active Problem  List   Diagnosis Date Noted  . Essential hypertension, benign 08/14/2012    Priority: Medium  . Chronic pain 04/23/2011    Priority: Medium  . Diabetes Mellitus Type II, controlled but with neurological manifestations 08/08/2009    Priority: Medium  . DEPRESSION/ANXIETY 08/08/2009    Priority: Medium  . Chronic foot ulcer 06/14/2011  . Obesity, Class II, BMI 35-39.9, with comorbidity 02/10/2011  . Hypertriglyceridemia 12/06/2010  . Polyneuropathy 12/04/2010  . IBS 02/02/2010   Medications- reviewed and updated Current Outpatient Prescriptions  Medication Sig Dispense Refill  . ACCU-CHEK AVIVA PLUS test strip CHECK 3 TIMES DAILY.  100 each  3  . aspirin 81 MG tablet Take 81 mg by mouth daily.      Marland Kitchen b complex vitamins capsule Take 1 capsule by mouth daily.      . clonazePAM (KLONOPIN) 2 MG tablet TAKE 1 TABLET BY MOUTH AT BEDTIME  30 tablet  2  . enalapril (VASOTEC) 20 MG tablet Take 1 tablet (20 mg total) by mouth daily.  90 tablet  3  . Lancets (ACCU-CHEK MULTICLIX) lancets CHECK SUGAR 3 X DAILY  102 each  3  . metFORMIN (GLUCOPHAGE) 1000 MG tablet Take 1 tablet (1,000 mg total) by mouth 2 (two) times daily with a meal. Please ignore script for daily dosing. Supposed to be BID.  180 tablet  3  . morphine (MS CONTIN) 30  MG 12 hr tablet Take 1 tablet (30 mg total) by mouth 3 (three) times daily.  90 tablet  0  . PARoxetine (PAXIL) 40 MG tablet TAKE ONE AND ONE-HALF TABLETS BY MOUTH ONCE A DAY  45 tablet  11  . pravastatin (PRAVACHOL) 40 MG tablet Take 1 tablet (40 mg total) by mouth every evening.  90 tablet  3  . tiZANidine (ZANAFLEX) 2 MG tablet Take 1 tablet (2 mg total) by mouth 3 (three) times daily.  90 tablet  3   No current facility-administered medications for this visit.    Objective: Temp(Src) 98.3 F (36.8 C) (Oral)  Wt 274 lb (124.286 kg)  LMP 07/25/2013 Gen: NAD, resting comfortably on table CV: RRR no murmurs rubs or gallops Lungs: CTAB no crackles, wheeze,  rhonchi Abdomen: soft//nondistended/normal bowel sounds. Morbidly obese. No rebound or guarding. Has pain in RUQ and LUQ mild. Has pain to palpation of left side of abdomen at mid portion of abdomen as well. After paplation of this, complains of severe pain through rest of visit Ext: no edema Skin: warm, dry, no rash   Assessment/Plan:  ? IBS vs. Narcotic bowel vs. Diabetic gastroparesis Chronic abdominal pain (recently worsening) Chronic diarrhea (recently worsening) No red flags today for acute issue. I also doubt malignancy. I suspect chronic diarrhea may be IBS related but will ask for Dr. Oletta Lamas input on this subject. DId order lipase, cmet, cbc today to evaluate for potential causes. Also hypertriglyceridemia places at risk for pancreatitis but i doubt that diagnosis at this time. With worsening abdominal pain and nausea I am primarily concerned about narcotic bowel syndrome as well as possible diabetic gastroparesis. Would think gastric emptying study would be useful but doubt could get patient off narcotics for this to be helpful. Escalating doses of narcotics are surely not beneficial to patient in this area though and would ask for GI's input on if we need to deescalate through pain managements help to help improve bowel function and chronic pain of GI region. Will have this note as well as labs faxed when available and await follow up with GI.   See AVS for return visit plans  Orders Placed This Encounter  Procedures  . CBC  . Comprehensive metabolic panel  . Lipid panel  . Lipase  . POCT glycosylated hemoglobin (Hb A1C)

## 2013-08-21 ENCOUNTER — Telehealth: Payer: Self-pay | Admitting: Family Medicine

## 2013-08-21 ENCOUNTER — Other Ambulatory Visit: Payer: Self-pay | Admitting: Family Medicine

## 2013-08-21 DIAGNOSIS — E781 Pure hyperglyceridemia: Secondary | ICD-10-CM

## 2013-08-21 DIAGNOSIS — K589 Irritable bowel syndrome without diarrhea: Secondary | ICD-10-CM

## 2013-08-21 MED ORDER — FENOFIBRATE 40 MG PO TABS: 40.0000 mg | ORAL_TABLET | Freq: Every day | ORAL | Status: DC

## 2013-08-21 MED ORDER — PROMETHAZINE HCL 25 MG PO TABS: 25.0000 mg | ORAL_TABLET | Freq: Three times a day (TID) | ORAL | Status: DC | PRN

## 2013-08-21 NOTE — Telephone Encounter (Signed)
Abd [place] ordered per patient request (will eval gallstones). Blue team to schedule.  Add phenergan Follow up with GI  Triglycerides-see problem oriented charting

## 2013-08-21 NOTE — Assessment & Plan Note (Signed)
Continue pravastatin as well as start fenofibrate given triglcyerides >1000

## 2013-08-22 ENCOUNTER — Other Ambulatory Visit: Payer: Self-pay | Admitting: Family Medicine

## 2013-08-24 ENCOUNTER — Other Ambulatory Visit: Payer: Self-pay | Admitting: *Deleted

## 2013-08-24 MED ORDER — METFORMIN HCL 1000 MG PO TABS: ORAL_TABLET | ORAL | Status: DC

## 2013-08-24 MED ORDER — PRAVASTATIN SODIUM 40 MG PO TABS: ORAL_TABLET | ORAL | Status: DC

## 2013-08-24 NOTE — Telephone Encounter (Signed)
LM for patient to call us back so we can schedule this appt for her at her convenience.  Abbott Jasinski,CMA

## 2013-08-24 NOTE — Telephone Encounter (Signed)
Tried to call patient again to find out available times for u/s but phone was not in service.  Will try again tomorrow and then will send a letter.  Fleeta Kunde,CMA

## 2013-08-25 ENCOUNTER — Encounter: Payer: Self-pay | Admitting: *Deleted

## 2013-08-25 NOTE — Telephone Encounter (Signed)
LM for patient to call back again.  Will send a mychart message to her for available times and dates for ultrasound. Kayler Rise,CMA

## 2013-09-07 ENCOUNTER — Other Ambulatory Visit: Payer: Self-pay | Admitting: Physical Medicine & Rehabilitation

## 2013-09-07 ENCOUNTER — Other Ambulatory Visit: Payer: Self-pay

## 2013-09-07 MED ORDER — CLONAZEPAM 2 MG PO TABS: ORAL_TABLET | ORAL | Status: DC

## 2013-09-09 ENCOUNTER — Telehealth: Payer: Self-pay | Admitting: Physical Medicine & Rehabilitation

## 2013-09-09 NOTE — Telephone Encounter (Signed)
Left message advising patient that the only appointment available is 745 09/10/13.

## 2013-09-09 NOTE — Telephone Encounter (Signed)
Pt will be out of med as of today will like to know if she can come in a day early like 06.18.2015.[name] does not have a afternoon appointment to give the patient... Please call pt to advise

## 2013-09-11 ENCOUNTER — Encounter: Payer: Self-pay | Admitting: Registered Nurse

## 2013-09-11 ENCOUNTER — Encounter: Payer: Medicaid Other | Attending: Physical Medicine & Rehabilitation | Admitting: Registered Nurse

## 2013-09-11 VITALS — BP 159/89 | HR 103 | Resp 14 | Ht 72.0 in | Wt 284.0 lb

## 2013-09-11 DIAGNOSIS — E1142 Type 2 diabetes mellitus with diabetic polyneuropathy: Secondary | ICD-10-CM | POA: Insufficient documentation

## 2013-09-11 DIAGNOSIS — M79641 Pain in right hand: Secondary | ICD-10-CM

## 2013-09-11 DIAGNOSIS — M79609 Pain in unspecified limb: Secondary | ICD-10-CM

## 2013-09-11 DIAGNOSIS — F32A Depression, unspecified: Secondary | ICD-10-CM

## 2013-09-11 DIAGNOSIS — F329 Major depressive disorder, single episode, unspecified: Secondary | ICD-10-CM

## 2013-09-11 DIAGNOSIS — G629 Polyneuropathy, unspecified: Secondary | ICD-10-CM

## 2013-09-11 DIAGNOSIS — G609 Hereditary and idiopathic neuropathy, unspecified: Secondary | ICD-10-CM

## 2013-09-11 DIAGNOSIS — E1149 Type 2 diabetes mellitus with other diabetic neurological complication: Secondary | ICD-10-CM

## 2013-09-11 DIAGNOSIS — Z79899 Other long term (current) drug therapy: Secondary | ICD-10-CM

## 2013-09-11 DIAGNOSIS — G5600 Carpal tunnel syndrome, unspecified upper limb: Secondary | ICD-10-CM | POA: Insufficient documentation

## 2013-09-11 DIAGNOSIS — F3289 Other specified depressive episodes: Secondary | ICD-10-CM

## 2013-09-11 DIAGNOSIS — M79642 Pain in left hand: Secondary | ICD-10-CM

## 2013-09-11 DIAGNOSIS — Z5181 Encounter for therapeutic drug level monitoring: Secondary | ICD-10-CM

## 2013-09-11 DIAGNOSIS — G8929 Other chronic pain: Secondary | ICD-10-CM

## 2013-09-11 MED ORDER — MORPHINE SULFATE ER 30 MG PO TBCR: 30.0000 mg | EXTENDED_RELEASE_TABLET | Freq: Three times a day (TID) | ORAL | Status: DC

## 2013-09-11 NOTE — Progress Notes (Signed)
Subjective:    Patient ID: [name], female    DOB: Oct 11, 1970, 43 y.o.   MRN: [id number]  HPI: [name] is a 43 year old female who returns for follow up for chronic pain and medication refill. She says her pain is located in her bilateral hands and feet. She rates her pain 8. She does not follow an exercise routine. Encouraged her to start becoming more active, demonstrated chair exercises and she verbalized understanding. Encourage her to seek counseling for herself as well as her daughter. [name] admits to feeling depressed and she voiced concerned about her daughter anxiety disorder. She lives a sedentary lifestyle, and was encouraged to start participating with various activities. She verbalizes understanding.  Pain Inventory Average Pain 8 Pain Right Now 8 My pain is constant, sharp, burning, dull, stabbing, tingling and aching  In the last 24 hours, has pain interfered with the following? General activity 10 Relation with others 10 Enjoyment of life 10 What TIME of day is your pain at its worst? evening, night Sleep (in general) Poor  Pain is worse with: walking, bending, sitting, standing and some activites Pain improves with: rest and medication Relief from Meds: 7  Mobility walk without assistance walk with assistance use a cane how many minutes can you walk? 5 ability to climb steps?  no do you drive?  no needs help with transfers Do you have any goals in this area?  yes  Function disabled: date disabled na I need assistance with the following:  bathing, meal prep, household duties and shopping Do you have any goals in this area?  yes  Neuro/Psych bowel control problems weakness numbness tingling trouble walking spasms dizziness depression anxiety  Prior Studies Any changes since last visit?  no  Physicians involved in your care Any changes since last visit?  no   History reviewed. No pertinent family history. History    Social History  . Marital Status: Legally Separated    Spouse Name: N/A    Number of Children: N/A  . Years of Education: N/A   Social History Main Topics  . Smoking status: Never Smoker   . Smokeless tobacco: Never Used  . Alcohol Use: None  . Drug Use: None  . Sexual Activity: None   Other Topics Concern  . None   Social History Narrative  . None   Past Surgical History  Procedure Laterality Date  . Cholecystectomy    . Abdominoplasty     Past Medical History  Diagnosis Date  . 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 DEPRESSION/ANXIETY 08/08/2009  . IBS 02/02/2010  . Polyneuropathy 12/04/2010   BP 159/89  Pulse 103  Resp 14  Ht 6' (1.829 m)  Wt 284 lb (128.822 kg)  BMI 38.51 kg/m2  SpO2 97%  LMP 08/25/2013  Opioid Risk Score:   Fall Risk Score: High Fall Risk (>13 points) (pt educated on fall risk, brochure given to pt previously)   Review of Systems  Constitutional: Positive for chills and diaphoresis.  Respiratory: Positive for shortness of breath.   Gastrointestinal: Positive for nausea, abdominal pain and diarrhea.  Genitourinary:       Bowel control problems  Neurological: Positive for dizziness, weakness and numbness.       Tingling, spasms  Psychiatric/Behavioral: The patient is nervous/anxious.        Depression  All other systems reviewed and are negative.  Objective:   Physical Exam  Nursing note and vitals reviewed. Constitutional: She is oriented to person, place, and time. She appears well-developed and well-nourished.  HENT:  Head: Normocephalic and atraumatic.  Neck: Normal range of motion. Neck supple.  Cardiovascular: Normal rate and regular rhythm.   Pulmonary/Chest: Effort normal and breath sounds normal.  Musculoskeletal:  Normal Muscle Bulk and Muscle Testing Reveals:  Upper Extremities: Full ROM and Muscle Strength  Right 5/5 and Left  4/5 Back without spinal or Paraspinal Tenderness Lower Extremities: Full ROM and Muscle Strength 5/5 Arises from chair with ease Narrow Based gait    Neurological: She is alert and oriented to person, place, and time.  Skin: Skin is warm and dry.  Psychiatric:  Affect Flat          Assessment & Plan:  1. Severe peripheral polyneuropathy of undetermined etiology.:  Refilled: MS Contin 30 mg one tablet three times a day#90  2. Bilateral progressive upper extremity pain. She has a known history of carpal tunnel: EMG scheduled for 7/15 with Dr. Letta Pate. Continue with current medication regime.  3. Muscle Spasms: Continue Zanaflex has been effective 4. Depression:  Encouraged Counseling: She was open to the suggestion and emotional support given.   30 minutes of face to face patient care time spent during this visit. All questions were encouraged and answered.   F/U in 1 month

## 2013-09-15 ENCOUNTER — Encounter: Payer: Self-pay | Admitting: *Deleted

## 2013-09-23 ENCOUNTER — Other Ambulatory Visit: Payer: Self-pay | Admitting: Family Medicine

## 2013-10-08 ENCOUNTER — Encounter: Payer: Medicaid Other | Attending: Physical Medicine & Rehabilitation

## 2013-10-08 ENCOUNTER — Ambulatory Visit (HOSPITAL_BASED_OUTPATIENT_CLINIC_OR_DEPARTMENT_OTHER): Payer: Medicaid Other | Admitting: Physical Medicine & Rehabilitation

## 2013-10-08 ENCOUNTER — Encounter: Payer: Self-pay | Admitting: Physical Medicine & Rehabilitation

## 2013-10-08 DIAGNOSIS — E1149 Type 2 diabetes mellitus with other diabetic neurological complication: Secondary | ICD-10-CM | POA: Insufficient documentation

## 2013-10-08 DIAGNOSIS — Z5181 Encounter for therapeutic drug level monitoring: Secondary | ICD-10-CM

## 2013-10-08 DIAGNOSIS — M79609 Pain in unspecified limb: Secondary | ICD-10-CM

## 2013-10-08 DIAGNOSIS — G5600 Carpal tunnel syndrome, unspecified upper limb: Secondary | ICD-10-CM | POA: Insufficient documentation

## 2013-10-08 DIAGNOSIS — E1142 Type 2 diabetes mellitus with diabetic polyneuropathy: Secondary | ICD-10-CM | POA: Insufficient documentation

## 2013-10-08 DIAGNOSIS — M79643 Pain in unspecified hand: Secondary | ICD-10-CM

## 2013-10-08 DIAGNOSIS — Z79899 Other long term (current) drug therapy: Secondary | ICD-10-CM

## 2013-10-08 DIAGNOSIS — G629 Polyneuropathy, unspecified: Secondary | ICD-10-CM

## 2013-10-08 DIAGNOSIS — G609 Hereditary and idiopathic neuropathy, unspecified: Secondary | ICD-10-CM

## 2013-10-08 DIAGNOSIS — G8929 Other chronic pain: Secondary | ICD-10-CM

## 2013-10-08 MED ORDER — MORPHINE SULFATE ER 30 MG PO TBCR: 30.0000 mg | EXTENDED_RELEASE_TABLET | Freq: Three times a day (TID) | ORAL | Status: DC

## 2013-10-08 NOTE — Patient Instructions (Signed)
There is evidence of polyneuropathy now affecting both hands. This can cause weakness as well as numbness as well as pain in the hands.

## 2013-10-08 NOTE — Progress Notes (Signed)
EMG performed 10/08/2013.  See EMG report under media tab.  Overall impression that there is a progression of the underlying polyneuropathy now involving both median and ulnar nerves sensory and motor are

## 2013-10-20 ENCOUNTER — Ambulatory Visit: Payer: Medicaid Other | Admitting: Obstetrics

## 2013-11-09 ENCOUNTER — Telehealth: Payer: Self-pay | Admitting: *Deleted

## 2013-11-09 NOTE — Telephone Encounter (Signed)
[employer] called because she has discussed with Dr Letta Pate previoulsy about increasing her morphine but opted to wait.  Now she is definitely wanting to increas.  She has an appt with Zella Ball on Wednesday and is hoping she can communicated with Dr Letta Pate before her appt so Zella Ball can given her the new dose.  I explained Kirsteins is out of the office Monday and Tuesday and does not come in routinely on Wednesday but Zella Ball can contact him about changes.

## 2013-11-10 ENCOUNTER — Ambulatory Visit: Payer: Medicaid Other | Admitting: Physical Medicine & Rehabilitation

## 2013-11-10 ENCOUNTER — Emergency Department: Payer: Self-pay | Admitting: Emergency Medicine

## 2013-11-10 LAB — BASIC METABOLIC PANEL
ANION GAP: 16 (ref 7–16)
BUN: 10 mg/dL (ref 7–18)
CO2: 21 mmol/L (ref 21–32)
Calcium, Total: 8.6 mg/dL (ref 8.5–10.1)
Chloride: 102 mmol/L (ref 98–107)
Creatinine: 0.84 mg/dL (ref 0.60–1.30)
EGFR (African American): >60
EGFR (Non-African Amer.): >60
GLUCOSE: 145 mg/dL — AB (ref 65–99)
OSMOLALITY: 279 (ref 275–301)
Potassium: 3.9 mmol/L (ref 3.5–5.1)
Sodium: 139 mmol/L (ref 136–145)

## 2013-11-10 LAB — URINALYSIS, COMPLETE
Bilirubin,UR: NEGATIVE
Glucose,UR: NEGATIVE mg/dL (ref 0–75)
LEUKOCYTE ESTERASE: NEGATIVE
Nitrite: NEGATIVE
PH: 5 (ref 4.5–8.0)
Protein: 100
RBC,UR: 590 /HPF (ref 0–5)
Specific Gravity: 1.03 (ref 1.003–1.030)
Squamous Epithelial: 4

## 2013-11-10 LAB — CBC WITH DIFFERENTIAL/PLATELET
BASOS PCT: 0.6 %
Basophil #: 0.1 10*3/uL (ref 0.0–0.1)
Eosinophil #: 0.3 10*3/uL (ref 0.0–0.7)
Eosinophil %: 3.1 %
HCT: 44.1 % (ref 35.0–47.0)
HGB: 14.2 g/dL (ref 12.0–16.0)
LYMPHS ABS: 1.3 10*3/uL (ref 1.0–3.6)
Lymphocyte %: 14 %
MCH: 28.4 pg (ref 26.0–34.0)
MCHC: 32.1 g/dL (ref 32.0–36.0)
MCV: 89 fL (ref 80–100)
MONO ABS: 0.5 x10 3/mm (ref 0.2–0.9)
Monocyte %: 5.2 %
NEUTROS PCT: 77.1 %
Neutrophil #: 7.2 10*3/uL — ABNORMAL HIGH (ref 1.4–6.5)
PLATELETS: 266 10*3/uL (ref 150–440)
RBC: 4.98 10*6/uL (ref 3.80–5.20)
RDW: 14.7 % — ABNORMAL HIGH (ref 11.5–14.5)
WBC: 9.4 10*3/uL (ref 3.6–11.0)

## 2013-11-11 ENCOUNTER — Other Ambulatory Visit: Payer: Self-pay

## 2013-11-11 ENCOUNTER — Encounter: Payer: Medicaid Other | Admitting: Registered Nurse

## 2013-11-11 ENCOUNTER — Ambulatory Visit: Payer: Medicaid Other | Admitting: Obstetrics

## 2013-11-11 MED ORDER — MORPHINE SULFATE ER 30 MG PO TBCR: 30.0000 mg | EXTENDED_RELEASE_TABLET | Freq: Three times a day (TID) | ORAL | Status: DC

## 2013-11-11 NOTE — Telephone Encounter (Signed)
Patient states she has a kidney stone and is unable to come to her appt today. RX printed for Weinert to sign. Patient's mother will pick up RX for her. Patient is aware she must come to her appt next month.

## 2013-11-26 ENCOUNTER — Telehealth: Payer: Self-pay

## 2013-11-26 NOTE — Telephone Encounter (Signed)
Patient is requesting a increase in dose of Morphine. Please advise.

## 2013-11-26 NOTE — Telephone Encounter (Signed)
Patient has a FU appt with Zella Ball on 9/14. Attempted to contact patient to see if she could come in on 9/18 @ 1:30 to see Dr. Letta Pate. Per Dr. Letta Pate patient would need a FU visit for any medication changes.  Scheduled patient for 9/18. Can cancel appt if she is not able to keep it.

## 2013-11-26 NOTE — Telephone Encounter (Signed)
Needs follow up visit for any med changes

## 2013-11-30 ENCOUNTER — Other Ambulatory Visit: Payer: Self-pay | Admitting: Physical Medicine & Rehabilitation

## 2013-12-01 ENCOUNTER — Other Ambulatory Visit: Payer: Self-pay

## 2013-12-01 MED ORDER — CLONAZEPAM 2 MG PO TABS: ORAL_TABLET | ORAL | Status: DC

## 2013-12-03 ENCOUNTER — Ambulatory Visit: Payer: Medicaid Other | Admitting: Obstetrics

## 2013-12-07 ENCOUNTER — Encounter: Payer: Medicaid Other | Admitting: Registered Nurse

## 2013-12-09 ENCOUNTER — Encounter: Payer: Self-pay | Admitting: Physical Medicine & Rehabilitation

## 2013-12-11 ENCOUNTER — Encounter: Payer: Medicaid Other | Attending: Physical Medicine & Rehabilitation

## 2013-12-11 ENCOUNTER — Ambulatory Visit (HOSPITAL_BASED_OUTPATIENT_CLINIC_OR_DEPARTMENT_OTHER): Payer: Medicaid Other | Admitting: Physical Medicine & Rehabilitation

## 2013-12-11 ENCOUNTER — Encounter: Payer: Self-pay | Admitting: Physical Medicine & Rehabilitation

## 2013-12-11 VITALS — BP 147/74 | HR 98 | Resp 14 | Wt 281.4 lb

## 2013-12-11 DIAGNOSIS — G609 Hereditary and idiopathic neuropathy, unspecified: Secondary | ICD-10-CM

## 2013-12-11 DIAGNOSIS — E1149 Type 2 diabetes mellitus with other diabetic neurological complication: Secondary | ICD-10-CM | POA: Diagnosis not present

## 2013-12-11 DIAGNOSIS — G5600 Carpal tunnel syndrome, unspecified upper limb: Secondary | ICD-10-CM | POA: Insufficient documentation

## 2013-12-11 DIAGNOSIS — E1142 Type 2 diabetes mellitus with diabetic polyneuropathy: Secondary | ICD-10-CM | POA: Insufficient documentation

## 2013-12-11 DIAGNOSIS — G629 Polyneuropathy, unspecified: Secondary | ICD-10-CM

## 2013-12-11 MED ORDER — MORPHINE SULFATE ER 30 MG PO TBCR: 30.0000 mg | EXTENDED_RELEASE_TABLET | Freq: Three times a day (TID) | ORAL | Status: DC

## 2013-12-11 MED ORDER — MORPHINE SULFATE 15 MG PO TABS: 15.0000 mg | ORAL_TABLET | Freq: Four times a day (QID) | ORAL | Status: DC | PRN

## 2013-12-11 NOTE — Progress Notes (Signed)
   Subjective:    Patient ID: [name], female    DOB: 1970-05-01, 43 y.o.   MRN: [id number]  HPI  Patient has increases in pain after certain activities such as walking longer distances. This occurs approximately 4 days per week. Continues to have some numbness in the hands. Discussed EMG results.  Latest hemoglobin A1c 6.8.   Pain Inventory Average Pain 8 Pain Right Now 8 My pain is constant, sharp, burning, dull, stabbing, tingling and aching  In the last 24 hours, has pain interfered with the following? General activity 10 Relation with others 10 Enjoyment of life 10 What TIME of day is your pain at its worst? evening, night Sleep (in general) Poor  Pain is worse with: walking, bending and standing Pain improves with: rest and medication Relief from Meds: 6  Mobility walk without assistance  Function disabled: date disabled 2012  Neuro/Psych weakness numbness tremor tingling spasms dizziness depression anxiety  Prior Studies Any changes since last visit?  no  Physicians involved in your care Any changes since last visit?  no   History reviewed. No pertinent family history. History   Social History  . Marital Status: Legally Separated    Spouse Name: N/A    Number of Children: N/A  . Years of Education: N/A   Social History Main Topics  . Smoking status: Never Smoker   . Smokeless tobacco: Never Used  . Alcohol Use: None  . Drug Use: None  . Sexual Activity: None   Other Topics Concern  . None   Social History Narrative  . None   Past Surgical History  Procedure Laterality Date  . Cholecystectomy    . Abdominoplasty     Past Medical History  Diagnosis Date  . 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 DEPRESSION/ANXIETY 08/08/2009  . IBS 02/02/2010  . Polyneuropathy 12/04/2010   BP 147/74  Pulse 98  Resp 14  Wt 281 lb 6.4 oz (127.642 kg)   SpO2 96%  Opioid Risk Score:   Fall Risk Score: Low Fall Risk (0-5 points)   Review of Systems     Objective:   Physical Exam  Feet show no evidence of open areas. Callus formation on the plantar surface of the right great toe greater than left great toe  Sensation absent to light touch in the toes. Present at the medial malleolus. Proprioception absent at the toes present at the ankle Negative Tinel's at the wrist Motor strength in the hands is 4/5 Motor strength in the lower since 5/5 proximal and trace to extension 3 minus ankle dorsiflexion 3 minus to flexion  Pedal pulses are reduced       Assessment & Plan:  #1. Chronic polyneuropathy with pain. Has not responded well to gabapentin or Lyrica. Has fair relief with MS Contin 30 mg 3 times a day but has some breakthrough pain was several times a week.  Will write a breakthrough dose 20 tablets of MSIR 15 mg per month  May use Zanaflex at night if needed  Followup 1 month  2. Median and ulnar neuropathies. May have carpal tunnel. Will check ultrasound of the median nerve at the wrist, inject if evidence of nerve swelling proximal to the carpal tunnel

## 2013-12-22 ENCOUNTER — Telehealth: Payer: Self-pay | Admitting: *Deleted

## 2013-12-22 MED ORDER — TIZANIDINE HCL 2 MG PO TABS: 2.0000 mg | ORAL_TABLET | Freq: Three times a day (TID) | ORAL | Status: DC

## 2013-12-22 NOTE — Telephone Encounter (Signed)
Called pharmacy for refill - Tizanidine HCL 2mg  tabs. Take 1 tab TID

## 2013-12-30 ENCOUNTER — Ambulatory Visit: Payer: Medicaid Other | Admitting: Family Medicine

## 2014-01-06 ENCOUNTER — Encounter: Payer: Medicaid Other | Admitting: Registered Nurse

## 2014-01-08 ENCOUNTER — Encounter: Payer: Self-pay | Admitting: Registered Nurse

## 2014-01-08 ENCOUNTER — Ambulatory Visit: Payer: Medicaid Other | Admitting: Registered Nurse

## 2014-01-08 ENCOUNTER — Encounter: Payer: Medicaid Other | Attending: Physical Medicine & Rehabilitation | Admitting: Registered Nurse

## 2014-01-08 VITALS — BP 148/94 | HR 86 | Resp 14 | Ht 70.0 in | Wt 280.0 lb

## 2014-01-08 DIAGNOSIS — M25561 Pain in right knee: Secondary | ICD-10-CM | POA: Diagnosis present

## 2014-01-08 DIAGNOSIS — G894 Chronic pain syndrome: Secondary | ICD-10-CM

## 2014-01-08 DIAGNOSIS — Z79899 Other long term (current) drug therapy: Secondary | ICD-10-CM

## 2014-01-08 DIAGNOSIS — G629 Polyneuropathy, unspecified: Secondary | ICD-10-CM

## 2014-01-08 DIAGNOSIS — Z5181 Encounter for therapeutic drug level monitoring: Secondary | ICD-10-CM

## 2014-01-08 DIAGNOSIS — M25562 Pain in left knee: Secondary | ICD-10-CM | POA: Diagnosis present

## 2014-01-08 MED ORDER — MORPHINE SULFATE 15 MG PO TABS: 15.0000 mg | ORAL_TABLET | Freq: Four times a day (QID) | ORAL | Status: DC | PRN

## 2014-01-08 MED ORDER — MORPHINE SULFATE ER 30 MG PO TBCR: 30.0000 mg | EXTENDED_RELEASE_TABLET | Freq: Three times a day (TID) | ORAL | Status: DC

## 2014-01-08 NOTE — Progress Notes (Signed)
Subjective:    Patient ID: [name], female    DOB: 1970-04-14, 43 y.o.   MRN: [id number]  HPI: [name] is a 43 year old female who returns for follow up for chronic pain and medication refill. She says her pain is located in her bilateral hands and feet. She rates her pain 7. She has not followed an exercise routine. Encouraged to increase her activity.  She states she fell 2 weeks ago, tripped over her puppy pads in the kitchen. Landed on her knees and complaining of excruciating pain. X-ray's ordered.  Average Pain 7 Pain Right Now 7 My pain is constant, sharp, burning, dull, stabbing, tingling and aching  In the last 24 hours, has pain interfered with the following? General activity 10 Relation with others 10 Enjoyment of life 10 What TIME of day is your pain at its worst? evening and night Sleep (in general) Poor  Pain is worse with: walking, bending, standing and some activites Pain improves with: rest and medication Relief from Meds: 6  Mobility walk without assistance how many minutes can you walk? 5 minute ability to climb steps?  no do you drive?  no  Function disabled: date disabled .  Neuro/Psych weakness numbness tingling trouble walking spasms dizziness depression anxiety  Prior Studies Any changes since last visit?  no  Physicians involved in your care Any changes since last visit?  no   No family history on file. History   Social History  . Marital Status: Legally Separated    Spouse Name: N/A    Number of Children: N/A  . Years of Education: N/A   Social History Main Topics  . Smoking status: Never Smoker   . Smokeless tobacco: Never Used  . Alcohol Use: None  . Drug Use: None  . Sexual Activity: None   Other Topics Concern  . None   Social History Narrative  . None   Past Surgical History  Procedure Laterality Date  . Cholecystectomy    . Abdominoplasty     Past Medical History  Diagnosis Date    . 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 DEPRESSION/ANXIETY 08/08/2009  . IBS 02/02/2010  . Polyneuropathy 12/04/2010   BP 148/94  Pulse 86  Resp 14  Ht 5\' 10"  (1.778 m)  Wt 280 lb (127.007 kg)  BMI 40.18 kg/m2  SpO2 98%  Opioid Risk Score:   Fall Risk Score: Moderate Fall Risk (6-13 points)   Review of Systems     Objective:   Physical Exam  Nursing note and vitals reviewed. Constitutional: She is oriented to person, place, and time. She appears well-developed and well-nourished.  HENT:  Head: Normocephalic and atraumatic.  Neck: Normal range of motion. Neck supple.  Cardiovascular: Normal rate and regular rhythm.   Pulmonary/Chest: Effort normal and breath sounds normal.  Musculoskeletal:  Normal Muscle Bulk and Muscle Testing Reveals:  Upper Extremities: Full ROM and Muscle strength 5/5 Lower Extremities: Full ROM and Muscle Strength 5/5 Bilateral Lower Extremities Flexion Produces Pain into Patella's Arises from chair with ease Narrow based gait  Neurological: She is alert and oriented to person, place, and time.  Skin: Skin is warm and dry.  Psychiatric: She has a normal mood and affect.          Assessment & Plan:  1. Severe peripheral polyneuropathy of undetermined etiology.:  Refilled: MS Contin 30 mg one tablet three times a  day#90 and MSIR 15 mg one tablet every 6 hours as needed #20. 2. Bilateral progressive upper extremity pain. She has a known history of carpal tunnel: Bilateral US of median nerve with Dr. Letta Pate. Continue with current medication regime.  3. Muscle Spasms: Continue Zanaflex has been effective   20 minutes of face to face patient care time spent during this visit. All questions were encouraged and answered.  F/U in 1 month

## 2014-01-11 LAB — HM DIABETES EYE EXAM

## 2014-01-19 ENCOUNTER — Ambulatory Visit: Payer: Medicaid Other | Admitting: Physical Medicine & Rehabilitation

## 2014-01-26 ENCOUNTER — Other Ambulatory Visit: Payer: Self-pay | Admitting: *Deleted

## 2014-01-26 MED ORDER — PAROXETINE HCL 40 MG PO TABS: ORAL_TABLET | ORAL | Status: DC

## 2014-02-05 ENCOUNTER — Encounter: Payer: Self-pay | Admitting: Registered Nurse

## 2014-02-05 ENCOUNTER — Encounter: Payer: Medicaid Other | Attending: Physical Medicine & Rehabilitation | Admitting: Registered Nurse

## 2014-02-05 ENCOUNTER — Telehealth: Payer: Self-pay | Admitting: *Deleted

## 2014-02-05 VITALS — BP 151/80 | HR 101 | Resp 14 | Ht 72.0 in | Wt 287.0 lb

## 2014-02-05 DIAGNOSIS — Z5181 Encounter for therapeutic drug level monitoring: Secondary | ICD-10-CM | POA: Diagnosis present

## 2014-02-05 DIAGNOSIS — G894 Chronic pain syndrome: Secondary | ICD-10-CM

## 2014-02-05 DIAGNOSIS — M79642 Pain in left hand: Secondary | ICD-10-CM

## 2014-02-05 DIAGNOSIS — M25562 Pain in left knee: Secondary | ICD-10-CM | POA: Insufficient documentation

## 2014-02-05 DIAGNOSIS — G629 Polyneuropathy, unspecified: Secondary | ICD-10-CM

## 2014-02-05 DIAGNOSIS — M25561 Pain in right knee: Secondary | ICD-10-CM | POA: Diagnosis present

## 2014-02-05 DIAGNOSIS — Z79899 Other long term (current) drug therapy: Secondary | ICD-10-CM | POA: Diagnosis present

## 2014-02-05 DIAGNOSIS — M79641 Pain in right hand: Secondary | ICD-10-CM

## 2014-02-05 MED ORDER — MORPHINE SULFATE ER 30 MG PO TBCR: 30.0000 mg | EXTENDED_RELEASE_TABLET | Freq: Three times a day (TID) | ORAL | Status: DC

## 2014-02-05 MED ORDER — MORPHINE SULFATE 15 MG PO TABS: 15.0000 mg | ORAL_TABLET | Freq: Four times a day (QID) | ORAL | Status: DC | PRN

## 2014-02-05 NOTE — Telephone Encounter (Signed)
Called yesterday, has an appt. 02/05/2014 today with Zella Ball at 3:30 p. Was hoping Zella Ball would confer with Dr. Letta Pate about her meds prior to her visit. Breakthrough medication is not working, she is hoping for a dose increase with her morphine 30 mg, requesting one more tablet a day

## 2014-02-05 NOTE — Progress Notes (Signed)
Subjective:    Patient ID: [name], female    DOB: 1970-06-23, 43 y.o.   MRN: [id number]  HPI: [name] is a 43 year old female who returns for follow up for chronic pain and medication refill. She says her pain is located in her bilateral hands, bilateral knees, bilateral lower extremities and feet. She rates her pain 10. She has not followed an exercise routine. She is living a sedentary lifestyle. Encouraged to increase her activity. She called office this week due to increase intensity of pain in her hands. We will increase her  MSIR to 30 tablets. She is scheduled on 02/11/2014 for US of the medial neve (bilaterally)with Dr. Letta Pate. She verbalizes understanding. She didn't go for the x-rays that were ordered on her bilateral knees last month. She states" she will go this weekend".  Pain Inventory Average Pain 10 Pain Right Now 10 My pain is constant, sharp, burning, dull, stabbing, tingling and aching  In the last 24 hours, has pain interfered with the following? General activity 10 Relation with others 10 Enjoyment of life 10 What TIME of day is your pain at its worst? morning, daytime, evening, night Sleep (in general) Poor  Pain is worse with: walking, bending, standing, unsure and some activites Pain improves with: therapy/exercise and medication Relief from Meds: 5  Mobility walk without assistance walk with assistance use a cane how many minutes can you walk? 5 ability to climb steps?  no do you drive?  no needs help with transfers transfers alone Do you have any goals in this area?  yes  Function disabled: date disabled . I need assistance with the following:  bathing, household duties and shopping Do you have any goals in this area?  yes  Neuro/Psych weakness numbness tremor tingling trouble walking spasms dizziness depression anxiety  Prior Studies Any changes since last visit?  no  Physicians involved in your  care Any changes since last visit?  no   History reviewed. No pertinent family history. History   Social History  . Marital Status: Legally Separated    Spouse Name: N/A    Number of Children: N/A  . Years of Education: N/A   Social History Main Topics  . Smoking status: Never Smoker   . Smokeless tobacco: Never Used  . Alcohol Use: None  . Drug Use: None  . Sexual Activity: None   Other Topics Concern  . None   Social History Narrative   Past Surgical History  Procedure Laterality Date  . Cholecystectomy    . Abdominoplasty     Past Medical History  Diagnosis Date  . 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 DEPRESSION/ANXIETY 08/08/2009  . IBS 02/02/2010  . Polyneuropathy 12/04/2010   Pulse 101  Resp 14  Ht 6' (1.829 m)  Wt 287 lb (130.182 kg)  BMI 38.92 kg/m2  SpO2 97%  Opioid Risk Score:   Fall Risk Score: High Fall Risk (>13 points) (pt. states she has received fall safety pamphlets) Review of Systems  Musculoskeletal: Positive for myalgias, joint swelling and arthralgias.  Neurological: Positive for tremors, weakness and numbness.       Spasm, tingling  Psychiatric/Behavioral: Positive for dysphoric mood. The patient is nervous/anxious.        Objective:   Physical Exam  Constitutional: She is oriented to person, place, and time. She appears well-developed and well-nourished.  HENT:  Head: Normocephalic and atraumatic.  Neck: Normal range of motion. Neck supple.  Cardiovascular: Normal rate and regular rhythm.   Pulmonary/Chest: Effort normal and breath sounds normal.  Musculoskeletal:  Normal Muscle Bulk and Muscle Testing Reveals: Upper Extremities: Full ROM and Muscle Strength 5/5 Lumbar Paraspinal Tenderness: L-3- L-5 Lower Extremities: Full ROM and Muscle Strength 5/5 Bilateral Lower Extremities Flexion Produces Pain into Patella. Arises from chair with  ease Narrow Based Gait   Neurological: She is alert and oriented to person, place, and time.  Skin: Skin is warm and dry.  Psychiatric: She has a normal mood and affect.  Nursing note and vitals reviewed.         Assessment & Plan:  1. Severe peripheral polyneuropathy of undetermined etiology.:  Refilled: MS Contin 30 mg one tablet three times a day#90 and MSIR 15 mg one tablet every 6 hours as needed increased #30. 2. Bilateral progressive upper extremity pain. She has a known history of carpal tunnel:On 02/11/2014 Bilateral US of median nerve with Dr. Letta Pate. Continue with current medication regime.  3. Muscle Spasms: Continue Zanaflex.   20 minutes of face to face patient care time spent during this visit. All questions were encouraged and answered.   F/U in 1 month

## 2014-02-09 NOTE — Telephone Encounter (Signed)
Pt's appointments have been made and change talked with patient personally at 145 pm 02/09/2014.Marland Kitchen She asked about the RX dosage. I asked her to please give clinical a little more time they are aware of her message

## 2014-02-09 NOTE — Telephone Encounter (Signed)
According to pt, she says Zella Ball agreed to increase her breakthrough medication (morphine)  to 30 mg per day, however she claims that her rx only supports one tablet a day, She also says she cannot make her ultrasound appt with Dr. Letta Pate, would like to reschedule on another day in the afternoon

## 2014-02-09 NOTE — Telephone Encounter (Signed)
Patient has history of cancelling appointments after getting RX.  On 02/05/14 we wrote her an RX for 30 for the bridge until her next appointment.  I called her back and explained that we will no longer be giving out RX unless patient is seen in the office.  I told her that she needs to keep her appointment on 02/11/14 @ 1:30 with AK for [place] to discuss changes in her medications

## 2014-02-11 ENCOUNTER — Ambulatory Visit: Payer: Medicaid Other | Admitting: Physical Medicine & Rehabilitation

## 2014-02-17 ENCOUNTER — Other Ambulatory Visit: Payer: Self-pay | Admitting: *Deleted

## 2014-02-21 MED ORDER — PAROXETINE HCL 40 MG PO TABS: ORAL_TABLET | ORAL | Status: DC

## 2014-02-22 ENCOUNTER — Other Ambulatory Visit: Payer: Self-pay | Admitting: Physical Medicine & Rehabilitation

## 2014-02-22 ENCOUNTER — Other Ambulatory Visit: Payer: Self-pay | Admitting: *Deleted

## 2014-02-23 ENCOUNTER — Other Ambulatory Visit: Payer: Self-pay | Admitting: *Deleted

## 2014-02-24 ENCOUNTER — Other Ambulatory Visit: Payer: Self-pay | Admitting: Physical Medicine & Rehabilitation

## 2014-02-24 ENCOUNTER — Telehealth: Payer: Self-pay | Admitting: *Deleted

## 2014-02-24 MED ORDER — PRAVASTATIN SODIUM 40 MG PO TABS: ORAL_TABLET | ORAL | Status: DC

## 2014-02-24 NOTE — Telephone Encounter (Signed)
Calling for [place].  She was supposed o be  Increased to 30 mg on her BTM msir and after she got home she had been given the 15 mg .  She took two of the pills to equal 30 mg but is now going to be out of med and will need a new rx.  In looking back at the notes Zella Ball increased the to #30 tablets, not 30 mg  From the #20 tablets she was getting.  I called her back and told her she will need to see Dr Letta Pate.  She has an upcoming appt with Zella Ball on 03/08/14 but she needs to see the MD for changes in her medications.  I gave her a verbal warning about her habit of frequent cancellations and that we have and will discharge patients on that basis and I wanted to caution her about doing this in the future.  She thought because it was sometimes 3 days in advance it was acceptable but I explained that that is not typically the case but even so with a provider booked weeks and months ahead of time and appts hard to schedule for pts to see him, this creates holes in his schedules that often cannot be filled at the last minute.  She verbalized understanding.  I will send a message to the scheduling dept to try and get her in to see Dr Letta Pate asap.

## 2014-02-24 NOTE — Telephone Encounter (Signed)
Please see if you can get Keitha in to see Dr Letta Pate before 03/08/14 appt with Zella Ball.  She is requesting med changes and needs to see MD.  See previous note.

## 2014-02-25 ENCOUNTER — Emergency Department: Payer: Self-pay | Admitting: Emergency Medicine

## 2014-02-28 LAB — BETA STREP CULTURE(ARMC)

## 2014-03-01 ENCOUNTER — Other Ambulatory Visit: Payer: Medicaid Other

## 2014-03-08 ENCOUNTER — Encounter: Payer: Medicaid Other | Attending: Physical Medicine & Rehabilitation | Admitting: Registered Nurse

## 2014-03-08 ENCOUNTER — Encounter: Payer: Self-pay | Admitting: Registered Nurse

## 2014-03-08 VITALS — BP 142/78 | HR 91 | Resp 14 | Ht 72.0 in | Wt 288.0 lb

## 2014-03-08 DIAGNOSIS — G894 Chronic pain syndrome: Secondary | ICD-10-CM | POA: Insufficient documentation

## 2014-03-08 DIAGNOSIS — M25562 Pain in left knee: Secondary | ICD-10-CM | POA: Insufficient documentation

## 2014-03-08 DIAGNOSIS — M25561 Pain in right knee: Secondary | ICD-10-CM | POA: Insufficient documentation

## 2014-03-08 DIAGNOSIS — M79642 Pain in left hand: Secondary | ICD-10-CM

## 2014-03-08 DIAGNOSIS — Z79899 Other long term (current) drug therapy: Secondary | ICD-10-CM

## 2014-03-08 DIAGNOSIS — Z5181 Encounter for therapeutic drug level monitoring: Secondary | ICD-10-CM | POA: Diagnosis present

## 2014-03-08 DIAGNOSIS — M79641 Pain in right hand: Secondary | ICD-10-CM

## 2014-03-08 DIAGNOSIS — G629 Polyneuropathy, unspecified: Secondary | ICD-10-CM

## 2014-03-08 MED ORDER — MORPHINE SULFATE 15 MG PO TABS: 15.0000 mg | ORAL_TABLET | Freq: Four times a day (QID) | ORAL | Status: DC | PRN

## 2014-03-08 MED ORDER — MORPHINE SULFATE ER 30 MG PO TBCR: 30.0000 mg | EXTENDED_RELEASE_TABLET | Freq: Three times a day (TID) | ORAL | Status: DC

## 2014-03-08 NOTE — Progress Notes (Signed)
Subjective:    Patient ID: [name], female    DOB: [birth year], 43 y.o.   MRN: [id number]  HPI:[name] is a 43 year old female who returns for follow up for chronic pain and medication refill. She says her pain is located in her bilateral hands, bilateral lower extremities and feet. She rates her pain 5. Her current exercise regime is walking, she states" she is getting out of the house more". Encouraged to continue to increase her activity and she verbalizes understanding.  Pain Inventory Average Pain 5 Pain Right Now 5 My pain is constant, sharp, burning, dull, stabbing, tingling and aching  In the last 24 hours, has pain interfered with the following? General activity 10 Relation with others 10 Enjoyment of life 10 What TIME of day is your pain at its worst? all Sleep (in general) Poor  Pain is worse with: walking, bending, sitting, standing and some activites Pain improves with: rest and medication Relief from Meds: 5  Mobility walk without assistance walk with assistance use a cane how many minutes can you walk? 5 ability to climb steps?  no do you drive?  no Do you have any goals in this area?  yes  Function disabled: date disabled . I need assistance with the following:  bathing, household duties and shopping  Neuro/Psych weakness numbness tremor tingling trouble walking spasms dizziness confusion depression anxiety  Prior Studies Any changes since last visit?  no  Physicians involved in your care Any changes since last visit?  no   History reviewed. No pertinent family history. History   Social History  . Marital Status: Legally Separated    Spouse Name: N/A    Number of Children: N/A  . Years of Education: N/A   Social History Main Topics  . Smoking status: Never Smoker   . Smokeless tobacco: Never Used  . Alcohol Use: None  . Drug Use: None  . Sexual Activity: None   Other Topics Concern  . None   Social  History Narrative   Past Surgical History  Procedure Laterality Date  . Cholecystectomy    . Abdominoplasty     Past Medical History  Diagnosis Date  . 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 DEPRESSION/ANXIETY 08/08/2009  . IBS 02/02/2010  . Polyneuropathy 12/04/2010   BP 142/78 mmHg  Pulse 91  Resp 14  Ht 6' (1.829 m)  Wt 288 lb (130.636 kg)  BMI 39.05 kg/m2  SpO2 99%  Opioid Risk Score:   Fall Risk Score:   Review of Systems  Constitutional: Positive for unexpected weight change.       Night sweats weght gain  Gastrointestinal: Positive for nausea, vomiting, abdominal pain and diarrhea.  Neurological: Positive for tremors, weakness and numbness.       Tingling spasms  Psychiatric/Behavioral: Positive for confusion and dysphoric mood. The patient is nervous/anxious.   All other systems reviewed and are negative.      Objective:   Physical Exam  Constitutional: She is oriented to person, place, and time. She appears well-developed and well-nourished.  HENT:  Head: Normocephalic and atraumatic.  Neck: Normal range of motion. Neck supple.  Cardiovascular: Normal rate and regular rhythm.   Pulmonary/Chest: Effort normal and breath sounds normal.  Musculoskeletal:  Normal Muscle Bulk and Muscle testing Reveals: Upper Extremities: Full ROM and Muscle strength 5/5 Lower Extremities: Full ROM and Muscle strength 5/5 Arises from chair with  ease Narrow based gait  Neurological: She is alert and oriented to person, place, and time.  Skin: Skin is warm and dry.  Psychiatric: She has a normal mood and affect.  Nursing note and vitals reviewed.         Assessment & Plan:  1. Severe peripheral polyneuropathy of undetermined etiology.:  Refilled: MS Contin 30 mg one tablet three times a day#90 and MSIR 15 mg one tablet every 6 hours as needed increased #30. 2. Bilateral progressive upper  extremity pain. She has a known history of carpal tunnel:On 03/30/2014 scheduled for Bilateral US of median nerve with Dr. Letta Pate. Continue with current medication regime.  3. Muscle Spasms: Continue Zanaflex.   20 minutes of face to face patient care time spent during this visit. All questions were encouraged and answered.   F/U in 1 month

## 2014-03-28 ENCOUNTER — Other Ambulatory Visit: Payer: Self-pay | Admitting: Registered Nurse

## 2014-03-28 ENCOUNTER — Other Ambulatory Visit: Payer: Self-pay | Admitting: Family Medicine

## 2014-03-30 ENCOUNTER — Encounter: Payer: Medicaid Other | Attending: Physical Medicine & Rehabilitation

## 2014-03-30 ENCOUNTER — Other Ambulatory Visit: Payer: Self-pay | Admitting: Physical Medicine & Rehabilitation

## 2014-03-30 ENCOUNTER — Ambulatory Visit (HOSPITAL_BASED_OUTPATIENT_CLINIC_OR_DEPARTMENT_OTHER): Payer: Medicaid Other | Admitting: Physical Medicine & Rehabilitation

## 2014-03-30 ENCOUNTER — Encounter: Payer: Self-pay | Admitting: Physical Medicine & Rehabilitation

## 2014-03-30 VITALS — BP 148/84 | HR 114 | Resp 14

## 2014-03-30 DIAGNOSIS — G894 Chronic pain syndrome: Secondary | ICD-10-CM | POA: Diagnosis present

## 2014-03-30 DIAGNOSIS — Z79899 Other long term (current) drug therapy: Secondary | ICD-10-CM

## 2014-03-30 DIAGNOSIS — M25561 Pain in right knee: Secondary | ICD-10-CM | POA: Diagnosis present

## 2014-03-30 DIAGNOSIS — M25562 Pain in left knee: Secondary | ICD-10-CM | POA: Insufficient documentation

## 2014-03-30 DIAGNOSIS — Z5181 Encounter for therapeutic drug level monitoring: Secondary | ICD-10-CM

## 2014-03-30 DIAGNOSIS — R208 Other disturbances of skin sensation: Secondary | ICD-10-CM | POA: Diagnosis not present

## 2014-03-30 MED ORDER — MORPHINE SULFATE ER 30 MG PO TBCR: 30.0000 mg | EXTENDED_RELEASE_TABLET | Freq: Three times a day (TID) | ORAL | Status: DC

## 2014-03-30 MED ORDER — MORPHINE SULFATE 15 MG PO TABS: 15.0000 mg | ORAL_TABLET | Freq: Every day | ORAL | Status: DC | PRN

## 2014-03-30 NOTE — Patient Instructions (Signed)
The results from today demonstrate that there is no evidence of carpal tunnel compression of the median nerve. Your hand symptoms are most likely related to your polyneuropathy and will not improve after a cortisone injection into the carpal tunnel.  You also have a higher number of tablets for your breakthrough medicine so that you can take 2 tablets on bad days and 1 tablet on good days #45 do not exceed this for 1 month supply

## 2014-03-30 NOTE — Progress Notes (Signed)
Patient returns today complaining of neuropathy pain in hands and feet. She has breakthrough medication that she takes on an as-needed basis. Her regular pain medication is MS Contin 30 g 3 times per day. The MS Contin IR 15 mg 1 tablet per day however she takes sometimes 2 tablets per day. This is much more helpful on her days when she has severe pain. She's been tried previously on medications such as gabapentin and Lyrica without much relief.  Examination intrinsic atrophy of both hands and feet. Ambulates wide-based gait Gen. No acute distress Mental status no evidence of drowsiness she is alert and oriented.  1. Chronic pain syndrome from severe polyneuropathy affecting bilateral lower extremities and starting to affect upper extremities.  2. Increasing bilateral hand pain question carpal tunnel syndrome because of her polyneuropathy cannot accurately assess with EMG/NCV therefore recommend bilateral upper extremity ultrasound to see whether she may benefit from corticosteroid injection of the carpal tunnels.   Bilateral upper extremity ultrasound Indication dysesthesias of both hands in the median nerve distribution Limited images at bilateral wrists. Cross sectional area of the right median nerve at the distal wrist crease 1.37 cmSquared Cross-sectional area of the right median nerve at 10 cm proximal to distal wrist crease 1.54 cmSquared  Cross-sectional area of the left median nerve at the distal wrist crease 1.27 cmSquared Cross-sectional area of the left median nerve X cm proximal to distal wrist crease 1.59 cmSquared  Impression no evidence of median nerve enlargement proximal to flexor retinaculum, therefore no ultrasonic evidence of carpal tunnel compression

## 2014-03-31 LAB — PMP ALCOHOL METABOLITE (ETG): Ethyl Glucuronide (EtG): NEGATIVE ng/mL

## 2014-04-02 LAB — BENZODIAZEPINES (GC/LC/MS), URINE
ALPRAZOLAMU: NEGATIVE ng/mL (ref <25)
CLONAZEPAU: 236 ng/mL (ref <25)
Flurazepam metabolite (GC/LC/MS), ur confirm: NEGATIVE ng/mL (ref <50)
LORAZEPAMU: NEGATIVE ng/mL (ref <50)
Midazolam (GC/LC/MS), ur confirm: NEGATIVE ng/mL (ref <50)
Nordiazepam (GC/LC/MS), ur confirm: NEGATIVE ng/mL (ref <50)
Oxazepam (GC/LC/MS), ur confirm: NEGATIVE ng/mL (ref <50)
TEMAZEPAMU: NEGATIVE ng/mL (ref <50)
TRIAZOLAMU: NEGATIVE ng/mL (ref <50)

## 2014-04-02 LAB — OPIATES/OPIOIDS (LC/MS-MS)
Codeine Urine: NEGATIVE ng/mL (ref <50)
Hydrocodone: NEGATIVE ng/mL (ref <50)
Hydromorphone: 345 ng/mL (ref <50)
Morphine Urine: >75000 ng/mL (ref <50)
NORHYDROCODONE, UR: NEGATIVE ng/mL (ref <50)
Noroxycodone, Ur: NEGATIVE ng/mL (ref <50)
Oxycodone, ur: NEGATIVE ng/mL (ref <50)
Oxymorphone: NEGATIVE ng/mL (ref <50)

## 2014-04-03 LAB — PRESCRIPTION MONITORING PROFILE (SOLSTAS)
Amphetamine/Meth: NEGATIVE ng/mL
BARBITURATE SCREEN, URINE: NEGATIVE ng/mL
BUPRENORPHINE, URINE: NEGATIVE ng/mL
CARISOPRODOL, URINE: NEGATIVE ng/mL
COCAINE METABOLITES: NEGATIVE ng/mL
Cannabinoid Scrn, Ur: NEGATIVE ng/mL
Creatinine, Urine: 209.39 mg/dL (ref >20.0)
FENTANYL URINE: NEGATIVE ng/mL
MDMA URINE: NEGATIVE ng/mL
MEPERIDINE UR: NEGATIVE ng/mL
Methadone Screen, Urine: NEGATIVE ng/mL
Nitrites, Initial: NEGATIVE ug/mL
Oxycodone Screen, Ur: NEGATIVE ng/mL
Propoxyphene: NEGATIVE ng/mL
TRAMADOL UR: NEGATIVE ng/mL
Tapentadol, urine: NEGATIVE ng/mL
Zolpidem, Urine: NEGATIVE ng/mL
pH, Initial: 5.2 pH (ref 4.5–8.9)

## 2014-04-05 ENCOUNTER — Ambulatory Visit: Payer: Medicaid Other | Admitting: Obstetrics

## 2014-04-05 NOTE — Progress Notes (Signed)
Urine drug screen for this encounter is consistent for prescribed medication 

## 2014-04-07 ENCOUNTER — Encounter: Payer: Medicaid Other | Admitting: Family Medicine

## 2014-04-09 ENCOUNTER — Encounter: Payer: Self-pay | Admitting: Physical Medicine & Rehabilitation

## 2014-04-16 ENCOUNTER — Encounter: Payer: Medicaid Other | Admitting: Family Medicine

## 2014-04-19 ENCOUNTER — Other Ambulatory Visit: Payer: Self-pay | Admitting: Registered Nurse

## 2014-04-29 ENCOUNTER — Telehealth: Payer: Self-pay

## 2014-04-29 NOTE — Telephone Encounter (Signed)
[name] [phone]  Magda Paganini left message that she needs a refill on her morphine (MSIR) 15 MG tablet, call her when it is ready to pick up.

## 2014-04-29 NOTE — Telephone Encounter (Signed)
She needs to be seen.  [name] has an appt and she can be put in that spot and cancel the appt for 05/10/14.  (she needs to be sure she is scheduling her follow ups within the 30 day window of her rx)

## 2014-05-04 ENCOUNTER — Encounter: Payer: Self-pay | Admitting: Registered Nurse

## 2014-05-04 ENCOUNTER — Encounter: Payer: Medicaid Other | Attending: Physical Medicine & Rehabilitation | Admitting: Registered Nurse

## 2014-05-04 VITALS — BP 119/68 | HR 100 | Resp 14

## 2014-05-04 DIAGNOSIS — Z5181 Encounter for therapeutic drug level monitoring: Secondary | ICD-10-CM | POA: Diagnosis present

## 2014-05-04 DIAGNOSIS — M25561 Pain in right knee: Secondary | ICD-10-CM | POA: Insufficient documentation

## 2014-05-04 DIAGNOSIS — Z79899 Other long term (current) drug therapy: Secondary | ICD-10-CM | POA: Diagnosis present

## 2014-05-04 DIAGNOSIS — M25562 Pain in left knee: Secondary | ICD-10-CM

## 2014-05-04 DIAGNOSIS — G894 Chronic pain syndrome: Secondary | ICD-10-CM

## 2014-05-04 DIAGNOSIS — M79642 Pain in left hand: Secondary | ICD-10-CM

## 2014-05-04 DIAGNOSIS — M79641 Pain in right hand: Secondary | ICD-10-CM

## 2014-05-04 DIAGNOSIS — G629 Polyneuropathy, unspecified: Secondary | ICD-10-CM

## 2014-05-04 DIAGNOSIS — E1140 Type 2 diabetes mellitus with diabetic neuropathy, unspecified: Secondary | ICD-10-CM

## 2014-05-04 MED ORDER — MORPHINE SULFATE 15 MG PO TABS: 15.0000 mg | ORAL_TABLET | Freq: Every day | ORAL | Status: DC | PRN

## 2014-05-04 MED ORDER — MORPHINE SULFATE ER 30 MG PO TBCR: 30.0000 mg | EXTENDED_RELEASE_TABLET | Freq: Three times a day (TID) | ORAL | Status: DC

## 2014-05-04 NOTE — Progress Notes (Signed)
Subjective:    Patient ID: [name], female    DOB: 06/09/1970, 44 y.o.   MRN: [id number]  HPI: [name] is a 44 year old female who returns for follow up for chronic pain and medication refill. She says her pain is located in her bilateral hands and bilateral feet. She rates her pain 9. Her current exercise regime is walking. Complaining of increase intensity of knee pain she didn't obtain the X-rays that were ordered. Re-ordered today. Encouraged her to become involved with outside activities and counseling. She verbalizes understanding. She states she has a boyfriend now.  S/P Ultrasound of median nerve no evidence of carpal tunnel compression. She verbalizes understanding.  Pain Inventory Average Pain 8 Pain Right Now 9 My pain is constant, sharp, burning, dull, stabbing, tingling and aching  In the last 24 hours, has pain interfered with the following? General activity 9 Relation with others 9 Enjoyment of life 9 What TIME of day is your pain at its worst? evening and night Sleep (in general) Poor  Pain is worse with: walking, bending, sitting, standing and some activites Pain improves with: rest and medication Relief from Meds: 7  Mobility walk without assistance how many minutes can you walk? 5-10  Function disabled: date disabled .  Neuro/Psych weakness numbness tremor tingling trouble walking spasms depression anxiety  Prior Studies Any changes since last visit?  no  Physicians involved in your care Any changes since last visit?  no   History reviewed. No pertinent family history. History   Social History  . Marital Status: Legally Separated    Spouse Name: N/A    Number of Children: N/A  . Years of Education: N/A   Social History Main Topics  . Smoking status: Never Smoker   . Smokeless tobacco: Never Used  . Alcohol Use: None  . Drug Use: None  . Sexual Activity: None   Other Topics Concern  . None   Social  History Narrative   Past Surgical History  Procedure Laterality Date  . Cholecystectomy    . Abdominoplasty     Past Medical History  Diagnosis Date  . 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 DEPRESSION/ANXIETY 08/08/2009  . IBS 02/02/2010  . Polyneuropathy 12/04/2010   BP 119/68 mmHg  Pulse 100  Resp 14  SpO2 98%  Opioid Risk Score:   Fall Risk Score: Low Fall Risk (0-5 points)  Review of Systems  HENT: Negative.   Eyes: Negative.   Respiratory: Negative.   Cardiovascular: Negative.   Gastrointestinal: Negative.   Endocrine: Negative.   Genitourinary: Negative.   Musculoskeletal: Positive for myalgias, back pain and arthralgias.  Allergic/Immunologic: Negative.   Neurological: Positive for tremors, weakness and numbness.       Trouble walking, spasms, tingling  Hematological: Negative.   Psychiatric/Behavioral: Positive for dysphoric mood. The patient is nervous/anxious.        Objective:   Physical Exam  Constitutional: She is oriented to person, place, and time. She appears well-developed and well-nourished.  HENT:  Head: Normocephalic and atraumatic.  Neck: Normal range of motion. Neck supple.  Cardiovascular: Normal rate and regular rhythm.   Pulmonary/Chest: Effort normal and breath sounds normal.  Musculoskeletal:  Normal Muscle Bulk and Muscle Testing Reveals: Upper Extremities: Full ROM and Muscle Strength 5/5 Lower Extremities: Full ROM and Muscle Strength 5/5 Lower Extremities Bilateral Flexion Produces Pain into Patella's Arises from chair with ease  Narrow Based Gait  Neurological: She is alert and oriented to person, place, and time.  Skin: Skin is warm and dry.  Psychiatric: She has a normal mood and affect.  Nursing note and vitals reviewed.         Assessment & Plan:  1. Severe peripheral polyneuropathy of undetermined etiology.:  Refilled: MS Contin 30 mg  one tablet three times a day#90 and MSIR 15 mg one- two tablet's daily as needed # 45. 2. Bilateral progressive upper extremity pain.  Continue with current medication regime.  3. Muscle Spasms: Continue Zanaflex.   20 minutes of face to face patient care time spent during this visit. All questions were encouraged and answered.   F/U in 1 month

## 2014-05-10 ENCOUNTER — Ambulatory Visit: Payer: Medicaid Other | Admitting: Registered Nurse

## 2014-05-20 ENCOUNTER — Encounter: Payer: Medicaid Other | Admitting: Family Medicine

## 2014-06-01 ENCOUNTER — Ambulatory Visit (HOSPITAL_BASED_OUTPATIENT_CLINIC_OR_DEPARTMENT_OTHER): Payer: Medicaid Other | Admitting: Physical Medicine & Rehabilitation

## 2014-06-01 ENCOUNTER — Encounter: Payer: Self-pay | Admitting: Physical Medicine & Rehabilitation

## 2014-06-01 ENCOUNTER — Encounter: Payer: Medicaid Other | Attending: Physical Medicine & Rehabilitation

## 2014-06-01 VITALS — BP 155/82 | HR 105 | Resp 16

## 2014-06-01 DIAGNOSIS — M25561 Pain in right knee: Secondary | ICD-10-CM | POA: Diagnosis present

## 2014-06-01 DIAGNOSIS — Z5181 Encounter for therapeutic drug level monitoring: Secondary | ICD-10-CM | POA: Insufficient documentation

## 2014-06-01 DIAGNOSIS — Z79899 Other long term (current) drug therapy: Secondary | ICD-10-CM | POA: Diagnosis present

## 2014-06-01 DIAGNOSIS — G894 Chronic pain syndrome: Secondary | ICD-10-CM | POA: Insufficient documentation

## 2014-06-01 DIAGNOSIS — M25562 Pain in left knee: Secondary | ICD-10-CM | POA: Diagnosis present

## 2014-06-01 DIAGNOSIS — G629 Polyneuropathy, unspecified: Secondary | ICD-10-CM

## 2014-06-01 MED ORDER — MORPHINE SULFATE ER 30 MG PO TBCR: 30.0000 mg | EXTENDED_RELEASE_TABLET | Freq: Three times a day (TID) | ORAL | Status: DC

## 2014-06-01 MED ORDER — TAPENTADOL HCL 50 MG PO TABS: 50.0000 mg | ORAL_TABLET | Freq: Two times a day (BID) | ORAL | Status: DC

## 2014-06-01 NOTE — Progress Notes (Signed)
Subjective:    Patient ID: [name], female    DOB: 1970-12-07, 44 y.o.   MRN: [id number]  HPI Patient feels like her symptoms have gotten worse in terms of hand and foot pain. We discussed the ultrasound test, there was no evidence of carpal tunnel, the increased symptoms in the upper limbs is likely progression of neuropathy  Patient has been on stable dose of morphine, Pain scores are still around 8 Has been tried in the past on oxycodone, methadone, and hydromorphone.    Has not responded to Lyrica or gabapentin in the past Has not tried Nucynta  No longer requiring wound center, No open areas on feet Pain Inventory Average Pain 8 Pain Right Now 8 My pain is constant, sharp, burning, dull, stabbing, tingling and aching  In the last 24 hours, has pain interfered with the following? General activity 10 Relation with others 10 Enjoyment of life 10 What TIME of day is your pain at its worst? evening and night Sleep (in general) Fair  Pain is worse with: walking, bending, sitting, inactivity, standing and some activites Pain improves with: medication and injections Relief from Meds: 5  Mobility walk without assistance walk with assistance use a cane how many minutes can you walk? 5-10 ability to climb steps?  no do you drive?  yes rarely  Function disabled: date disabled . I need assistance with the following:  meal prep, household duties and shopping  Neuro/Psych weakness numbness tremor tingling trouble walking spasms dizziness depression anxiety  Prior Studies Any changes since last visit?  no  Physicians involved in your care Any changes since last visit?  no   History reviewed. No pertinent family history. History   Social History  . Marital Status: Legally Separated    Spouse Name: N/A  . Number of Children: N/A  . Years of Education: N/A   Social History Main Topics  . Smoking status: Never Smoker   . Smokeless tobacco: Never  Used  . Alcohol Use: Not on file  . Drug Use: Not on file  . Sexual Activity: Not on file   Other Topics Concern  . None   Social History Narrative   Past Surgical History  Procedure Laterality Date  . Cholecystectomy    . Abdominoplasty     Past Medical History  Diagnosis Date  . 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 DEPRESSION/ANXIETY 08/08/2009  . IBS 02/02/2010  . Polyneuropathy 12/04/2010   BP 155/82 mmHg  Pulse 105  Resp 16  SpO2 97%  Opioid Risk Score:   Fall Risk Score: Moderate Fall Risk (6-13 points) (previoulsy educated and given handout)  Review of Systems  Constitutional: Positive for diaphoresis.  Gastrointestinal: Positive for nausea, abdominal pain, diarrhea and constipation.  Endocrine:       High blood sugars  Musculoskeletal: Positive for gait problem.  Neurological: Positive for dizziness, tremors, weakness and numbness.       Tingling  Psychiatric/Behavioral: Positive for dysphoric mood. The patient is nervous/anxious.   All other systems reviewed and are negative.      Objective:   Physical Exam  Constitutional: She is oriented to person, place, and time. She appears well-developed and well-nourished.  Neurological: She is alert and oriented to person, place, and time.  Psychiatric: She has a normal mood and affect.  Nursing note and vitals reviewed.  Skin shows no evidence of breakdown in bilateral lower extremities Extremity shows  intrinsic atrophy of both feet with hammertoes Motor strength is 3 minus in the toe flexors and toe extensors bilaterally Ankle dorsiflexor plantar flexors 5/5 bilaterally Sensation absent to pinprick and light touch bilateral toes intact at medial malleolus Proprioception absent in the great toe       Assessment & Plan:  1. Diabetic polyneuropathy with chronic pain, despite good diabetic control appears to be progressing.  Having increasing upper extremity symptoms, She has mainly sensory symptoms, motor weakness is confined to toe flexors and extensors due to intrinsic foot muscle weakness. Do not think the patient requires ankle-foot orthoses at this time  Advised strict footcare, patient has been doing better with this has not required wound center for at least 6 months  Her chronic neuropathic pain has been difficult to control. She has tried numerous medications including methadone, oxycodone, hydrocodone, hydromorphone as well as the morphine. She has also tried Lyrica and gabapentin without benefit Recommend continue morphine extended release 30 g 3 times a day Will trial Nucynta 50 mg twice a day for breakthrough pain Return to clinic one month  Return to clinic one month

## 2014-06-02 ENCOUNTER — Emergency Department: Payer: Self-pay | Admitting: Emergency Medicine

## 2014-06-14 ENCOUNTER — Ambulatory Visit: Payer: Medicaid Other | Admitting: Obstetrics

## 2014-06-15 ENCOUNTER — Telehealth: Payer: Self-pay | Admitting: Family Medicine

## 2014-06-15 NOTE — Telephone Encounter (Signed)
Will forward to MD to see if he will place referral for her irritable.  This was previously placed in 2013. Jazmin Hartsell,CMA

## 2014-06-15 NOTE — Telephone Encounter (Signed)
Pt wants to know if a new referral can be placed for her to go back to her GI doctor, says its an ongoing issue, pt has an upcoming appointment but was hoping to get in with her GI doctor before her appt here. Pt sees Dr. Oletta Lamas at [place].

## 2014-06-17 ENCOUNTER — Ambulatory Visit: Payer: Medicaid Other | Admitting: Obstetrics

## 2014-06-21 ENCOUNTER — Other Ambulatory Visit: Payer: Self-pay | Admitting: Family Medicine

## 2014-06-21 DIAGNOSIS — K589 Irritable bowel syndrome without diarrhea: Secondary | ICD-10-CM

## 2014-06-21 NOTE — Telephone Encounter (Signed)
Referral placed.

## 2014-06-22 ENCOUNTER — Other Ambulatory Visit: Payer: Self-pay | Admitting: Family Medicine

## 2014-06-22 ENCOUNTER — Other Ambulatory Visit: Payer: Self-pay | Admitting: Physical Medicine & Rehabilitation

## 2014-06-22 DIAGNOSIS — I10 Essential (primary) hypertension: Secondary | ICD-10-CM

## 2014-06-24 ENCOUNTER — Telehealth: Payer: Self-pay | Admitting: *Deleted

## 2014-06-24 DIAGNOSIS — B3731 Acute candidiasis of vulva and vagina: Secondary | ICD-10-CM

## 2014-06-24 DIAGNOSIS — B373 Candidiasis of vulva and vagina: Secondary | ICD-10-CM

## 2014-06-24 MED ORDER — FLUCONAZOLE 150 MG PO TABS: 150.0000 mg | ORAL_TABLET | ORAL | Status: DC

## 2014-06-24 NOTE — Telephone Encounter (Signed)
Patient is calling to request Diflucan for yeast. Patient has an appointment for her annual in June.

## 2014-06-30 ENCOUNTER — Encounter: Payer: Self-pay | Admitting: Registered Nurse

## 2014-06-30 ENCOUNTER — Encounter: Payer: Medicaid Other | Attending: Physical Medicine & Rehabilitation | Admitting: Registered Nurse

## 2014-06-30 VITALS — BP 139/65 | HR 91 | Resp 14

## 2014-06-30 DIAGNOSIS — Z5181 Encounter for therapeutic drug level monitoring: Secondary | ICD-10-CM | POA: Diagnosis present

## 2014-06-30 DIAGNOSIS — M25562 Pain in left knee: Secondary | ICD-10-CM | POA: Insufficient documentation

## 2014-06-30 DIAGNOSIS — M79642 Pain in left hand: Secondary | ICD-10-CM

## 2014-06-30 DIAGNOSIS — M25561 Pain in right knee: Secondary | ICD-10-CM | POA: Insufficient documentation

## 2014-06-30 DIAGNOSIS — M545 Low back pain, unspecified: Secondary | ICD-10-CM

## 2014-06-30 DIAGNOSIS — G629 Polyneuropathy, unspecified: Secondary | ICD-10-CM | POA: Diagnosis not present

## 2014-06-30 DIAGNOSIS — M79641 Pain in right hand: Secondary | ICD-10-CM

## 2014-06-30 DIAGNOSIS — G894 Chronic pain syndrome: Secondary | ICD-10-CM | POA: Insufficient documentation

## 2014-06-30 DIAGNOSIS — E1140 Type 2 diabetes mellitus with diabetic neuropathy, unspecified: Secondary | ICD-10-CM

## 2014-06-30 DIAGNOSIS — Z79899 Other long term (current) drug therapy: Secondary | ICD-10-CM | POA: Insufficient documentation

## 2014-06-30 DIAGNOSIS — G8929 Other chronic pain: Secondary | ICD-10-CM

## 2014-06-30 MED ORDER — MORPHINE SULFATE ER 30 MG PO TBCR: 30.0000 mg | EXTENDED_RELEASE_TABLET | Freq: Three times a day (TID) | ORAL | Status: DC

## 2014-06-30 MED ORDER — MORPHINE SULFATE 15 MG PO TABS: ORAL_TABLET | ORAL | Status: DC

## 2014-06-30 NOTE — Progress Notes (Signed)
Subjective:    Patient ID: [name], female    DOB: 02/18/1971, 44 y.o.   MRN: [id number]  HPI: [name] is a 44 year old female who returns for follow up for chronic pain and medication refill. She says her pain is located in her bilateral hands, lower back and bilateral feet. Lower extremity pain radiating into feet.She rates her pain 7. She's not following her  current exercise regime due to increase intensity of lower back pain. She was started on Nucynta last month her GI distressed increased and she has been experiencing unbearable headaches and nausea. She would like to resume her MSIR. Nucynta was destroyed and MSIR will be resumed. She verbalizes understanding.  She has an appointment with [facility] Dr. Oletta Lamas.  Pain Inventory Average Pain 7 Pain Right Now 7 My pain is constant, sharp, burning, dull, stabbing, tingling and aching  In the last 24 hours, has pain interfered with the following? General activity 10 Relation with others 10 Enjoyment of life 10 What TIME of day is your pain at its worst? evening,night Sleep (in general) Fair  Pain is worse with: walking, bending, sitting, standing and some activites Pain improves with: rest and medication Relief from Meds: 7  Mobility walk without assistance walk with assistance use a cane how many minutes can you walk? 5 ability to climb steps?  no do you drive?  yes Do you have any goals in this area?  yes  Function disabled: date disabled . I need assistance with the following:  meal prep, household duties and shopping Do you have any goals in this area?  yes  Neuro/Psych weakness numbness tremor tingling trouble walking spasms dizziness depression anxiety  Prior Studies Any changes since last visit?  no  Physicians involved in your care Any changes since last visit?  no   History reviewed. No pertinent family history. History   Social History  . Marital  Status: Legally Separated    Spouse Name: N/A  . Number of Children: N/A  . Years of Education: N/A   Social History Main Topics  . Smoking status: Never Smoker   . Smokeless tobacco: Never Used  . Alcohol Use: Not on file  . Drug Use: Not on file  . Sexual Activity: Not on file   Other Topics Concern  . None   Social History Narrative   Past Surgical History  Procedure Laterality Date  . Cholecystectomy    . Abdominoplasty     Past Medical History  Diagnosis Date  . 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 DEPRESSION/ANXIETY 08/08/2009  . IBS 02/02/2010  . Polyneuropathy 12/04/2010   BP 139/65 mmHg  Pulse 91  Resp 14  SpO2 98%  Opioid Risk Score:   Fall Risk Score: Moderate Fall Risk (6-13 points)`1  Depression screen PHQ 2/9  Depression screen PHQ 2/9 12/11/2012  Decreased Interest 3  Down, Depressed, Hopeless 3  PHQ - 2 Score 6     Review of Systems  Constitutional:       Night sweats Weight gain  Gastrointestinal: Positive for nausea, diarrhea and constipation.  Neurological: Positive for dizziness, tremors, weakness and numbness.       Tingling Spasms   Psychiatric/Behavioral: Positive for dysphoric mood. The patient is nervous/anxious.   All other systems reviewed and are negative.      Objective:   Physical Exam  Constitutional: She is oriented to person,  place, and time. She appears well-developed and well-nourished.  HENT:  Head: Normocephalic and atraumatic.  Neck: Normal range of motion. Neck supple.  Cardiovascular: Normal rate and regular rhythm.   Pulmonary/Chest: Effort normal and breath sounds normal.  Musculoskeletal:  Normal Muscle Bulk and Muscle Testing Reveals: Upper Extremities: Full ROM and Muscle strength 5/5 Lumbar Paraspinal Tenderness: L-3- L-5 Lower Extremities: Full ROM and Muscle strength 5/5 Arises from chair with ease Antalgic gait    Neurological: She is alert and oriented to person, place, and time.  Skin: Skin is warm and dry.  Psychiatric: She has a normal mood and affect.  Nursing note and vitals reviewed.         Assessment & Plan:  1. Severe peripheral polyneuropathy of undetermined etiology.:  Refilled: MS Contin 30 mg one tablet three times a day#90 and MSIR 15 mg one- two tablet's daily as needed, Increased to  # 55. 2. Bilateral progressive upper extremity pain. Continue with current medication regime.  3. Muscle Spasms: Continue Zanaflex.  4. Acute Exacerbation of chronic low back pain: RX: Lumbar X-ray 20 minutes of face to face patient care time spent during this visit. All questions were encouraged and answered.   F/U in 1 month

## 2014-07-01 ENCOUNTER — Encounter: Payer: Self-pay | Admitting: Family Medicine

## 2014-07-01 ENCOUNTER — Ambulatory Visit (INDEPENDENT_AMBULATORY_CARE_PROVIDER_SITE_OTHER): Payer: Medicaid Other | Admitting: Family Medicine

## 2014-07-01 VITALS — BP 124/80 | HR 92 | Temp 98.8°F | Ht 72.0 in | Wt 285.2 lb

## 2014-07-01 DIAGNOSIS — E781 Pure hyperglyceridemia: Secondary | ICD-10-CM | POA: Diagnosis not present

## 2014-07-01 DIAGNOSIS — D1720 Benign lipomatous neoplasm of skin and subcutaneous tissue of unspecified limb: Secondary | ICD-10-CM

## 2014-07-01 DIAGNOSIS — I10 Essential (primary) hypertension: Secondary | ICD-10-CM | POA: Diagnosis not present

## 2014-07-01 DIAGNOSIS — E119 Type 2 diabetes mellitus without complications: Secondary | ICD-10-CM

## 2014-07-01 DIAGNOSIS — M799 Soft tissue disorder, unspecified: Secondary | ICD-10-CM | POA: Diagnosis not present

## 2014-07-01 DIAGNOSIS — M7989 Other specified soft tissue disorders: Secondary | ICD-10-CM | POA: Insufficient documentation

## 2014-07-01 LAB — LIPID PANEL
CHOL/HDL RATIO: 4.7 ratio
Cholesterol: 145 mg/dL (ref 0–200)
HDL: 31 mg/dL — AB (ref >=46)
Triglycerides: 575 mg/dL — ABNORMAL HIGH (ref <150)

## 2014-07-01 LAB — COMPREHENSIVE METABOLIC PANEL
ALBUMIN: 4.1 g/dL (ref 3.5–5.2)
ALT: 23 U/L (ref 0–35)
AST: 25 U/L (ref 0–37)
Alkaline Phosphatase: 39 U/L (ref 39–117)
BILIRUBIN TOTAL: 0.3 mg/dL (ref 0.2–1.2)
BUN: 12 mg/dL (ref 6–23)
CO2: 23 meq/L (ref 19–32)
Calcium: 9.1 mg/dL (ref 8.4–10.5)
Chloride: 98 mEq/L (ref 96–112)
Creat: 0.47 mg/dL — ABNORMAL LOW (ref 0.50–1.10)
GLUCOSE: 134 mg/dL — AB (ref 70–99)
POTASSIUM: 5.1 meq/L (ref 3.5–5.3)
SODIUM: 136 meq/L (ref 135–145)
TOTAL PROTEIN: 7.2 g/dL (ref 6.0–8.3)

## 2014-07-01 LAB — CBC
HCT: 41.6 % (ref 36.0–46.0)
Hemoglobin: 13.7 g/dL (ref 12.0–15.0)
MCH: 27.8 pg (ref 26.0–34.0)
MCHC: 32.9 g/dL (ref 30.0–36.0)
MCV: 84.6 fL (ref 78.0–100.0)
MPV: 10.8 fL (ref 8.6–12.4)
Platelets: 282 10*3/uL (ref 150–400)
RBC: 4.92 MIL/uL (ref 3.87–5.11)
RDW: 15.2 % (ref 11.5–15.5)
WBC: 9.1 10*3/uL (ref 4.0–10.5)

## 2014-07-01 LAB — POCT GLYCOSYLATED HEMOGLOBIN (HGB A1C): HEMOGLOBIN A1C: 8.1

## 2014-07-01 MED ORDER — ATORVASTATIN CALCIUM 40 MG PO TABS: 40.0000 mg | ORAL_TABLET | Freq: Every day | ORAL | Status: DC

## 2014-07-01 NOTE — Assessment & Plan Note (Signed)
Most likely benign etiology as unchanged for the past several years - Refer to dermatology for biopsy and further evaluation

## 2014-07-01 NOTE — Patient Instructions (Addendum)
It was great seeing you today.   I have order some labs today to check your diabetes and electrolytes. I will send you a letter with the results, or call you if we need to make any changes to your current therapies.  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  Please take this at night   Please bring all your medications to every doctors visit  Sign up for My Chart to have easy access to your labs results, and communication with your Primary care physician.  Next Appointment  Please make an appointment with Dr Berkley Harvey in 6 months for diabetes and hypertension, or sooner if your have other concerns  Please make appointment for your Pap smear at your convenience   I look forward to talking with you again at our next visit. If you have any questions or concerns before then, please call the clinic at [phone].  Take Care,   Dr Phill Myron

## 2014-07-01 NOTE — Progress Notes (Signed)
  Patient name: [name] MRN [id number]  Date of birth: 1970-10-17  CC & HPI:  [name] is a 44 y.o. female presenting today for DM, HTN, hypertriglyceridemia, and lumps on lower legs.   DIABETES  Lab Results  Component Value Date   HGBA1C 8.1 07/01/2014    Symptoms of Hypoglycemia? no  Comorbid Symptoms:  Yes-  Neuropathy: no Vision problems  CHRONIC HYPERTENSION BP Readings from Last 3 Encounters:  07/01/14 124/80  06/30/14 139/65  06/01/14 155/82    Disease Monitoring  Chest pain: no   Dyspnea: no   Claudication: no   HLD Lab Results  Component Value Date   LDLCALC NOT CALC 08/20/2013    Medication Compliance: yes  Side Effects?: NO RUQ pain; jaundice  Medication compliance: yes   Preventitive Healthcare:  Smoking: non smoker   Diet: Vegetarian  Exercise: Doesn't exercise due to chronic neuropathy   Salt Restriction: < 1500 mg daily  Lumps on lower legs - She complains of multiple small soft subcutaneous lumps on lower extremities - Reports they are stable over the past couple years - Nonpainful - No previous biopsies or evaluations - She requests dermatology referral today  ROS: See HPI    Objective Findings:  Vitals: BP 124/80 mmHg  Pulse 92  Temp(Src) 98.8 F (37.1 C) (Oral)  Ht 6' (1.829 m)  Wt 285 lb 4 oz (129.389 kg)  BMI 38.68 kg/m2  LMP 06/09/2014  Gen: NAD CV: RRR w/o m/r/g, pulses +2 b/l Resp: CTAB w/ normal respiratory effort LE: Multiple bilateral subcutaneous soft tissue masses that are nontender and non-fixed on bilateral lower extremities Feet: Mild calluses on bilateral plantar first metatarsal heads  Assessment & Plan:   Please See Problem Focused Assessment & Plan

## 2014-07-01 NOTE — Assessment & Plan Note (Signed)
Repeat lipid panel; she reports that she is fasting today - Switch statin to Lipitor

## 2014-07-01 NOTE — Assessment & Plan Note (Signed)
BP well controlled today - Check CMET & CBC - Continue enalapril

## 2014-07-01 NOTE — Assessment & Plan Note (Addendum)
Check A1c - Foot exam completed today: Mild calluses on bilateral plantar first metatarsal heads - Patient declines PCV13 - Check urine microalbumin

## 2014-07-02 LAB — MICROALBUMIN, URINE: MICROALB UR: 14.8 mg/dL — AB (ref <2.0)

## 2014-07-02 NOTE — Progress Notes (Signed)
I was preceptor the day of this visit.   

## 2014-07-08 ENCOUNTER — Encounter: Payer: Self-pay | Admitting: Family Medicine

## 2014-07-08 ENCOUNTER — Telehealth: Payer: Self-pay | Admitting: *Deleted

## 2014-07-08 ENCOUNTER — Telehealth: Payer: Self-pay | Admitting: Family Medicine

## 2014-07-08 MED ORDER — MORPHINE SULFATE ER 30 MG PO TBCR: 30.0000 mg | EXTENDED_RELEASE_TABLET | Freq: Three times a day (TID) | ORAL | Status: DC

## 2014-07-08 NOTE — Telephone Encounter (Signed)
Sent letter with lab results indicating blood sugar and TGs remain elevated. Will have patient schedule visit in next 1-2 months to discuss next treatment options.

## 2014-07-08 NOTE — Telephone Encounter (Signed)
Advised pt as directed below and verbalized understanding. She stated that her TGs went down 500pts since last time and that she is not sure about continuing to take Lipitor. She said because she had an episode where she blacked out for 1-2 seconds while lying down and not sure if it was related to Lipitor (only happened once). She wants to try and stick to what she has been doing before to see if this will help. She was concerned about the level for her urine microalbumin and what the number meant as well. Please advise.  Simuel Stebner, CMA.

## 2014-07-08 NOTE — Telephone Encounter (Signed)
James has a problem in that she got the [facility] Rx filled and had cut the extended release off and put the Rx in her night stand but cannot find it now.  She is asking for a new rx be issued.  If she finds it she will turn in the addiotional RX

## 2014-07-08 NOTE — Telephone Encounter (Signed)
Return [name] call. She misplaced her MS Contin 30 mg script. I reviewed the [place] Controlled Substance Reporting System. The last time MS Contin was filled was on on 06/09/14. She was instructed to pick up the script. Also instructed if she finds the previous script  Return it  to the office. She verbalizes understanding. She will pick up script today instructed to come no later than 3:30. She verbalizes understanding.

## 2014-07-26 ENCOUNTER — Other Ambulatory Visit: Payer: Self-pay | Admitting: Family Medicine

## 2014-07-26 ENCOUNTER — Other Ambulatory Visit: Payer: Self-pay | Admitting: Physical Medicine & Rehabilitation

## 2014-07-28 ENCOUNTER — Other Ambulatory Visit: Payer: Self-pay | Admitting: *Deleted

## 2014-07-28 MED ORDER — CLONAZEPAM 2 MG PO TABS: 2.0000 mg | ORAL_TABLET | Freq: Every day | ORAL | Status: DC

## 2014-07-28 NOTE — Telephone Encounter (Signed)
Recd electronic refill request for Klonopin 2 mg tablets - take 1 tablet at bedtime # 30  #RF 2- phoned in

## 2014-08-04 ENCOUNTER — Encounter: Payer: Self-pay | Admitting: Registered Nurse

## 2014-08-04 ENCOUNTER — Encounter: Payer: Medicaid Other | Attending: Physical Medicine & Rehabilitation | Admitting: Registered Nurse

## 2014-08-04 VITALS — BP 148/84 | HR 99 | Resp 14

## 2014-08-04 DIAGNOSIS — E1140 Type 2 diabetes mellitus with diabetic neuropathy, unspecified: Secondary | ICD-10-CM

## 2014-08-04 DIAGNOSIS — Z79899 Other long term (current) drug therapy: Secondary | ICD-10-CM | POA: Diagnosis present

## 2014-08-04 DIAGNOSIS — Z5181 Encounter for therapeutic drug level monitoring: Secondary | ICD-10-CM | POA: Insufficient documentation

## 2014-08-04 DIAGNOSIS — G629 Polyneuropathy, unspecified: Secondary | ICD-10-CM | POA: Diagnosis not present

## 2014-08-04 DIAGNOSIS — G894 Chronic pain syndrome: Secondary | ICD-10-CM | POA: Diagnosis not present

## 2014-08-04 DIAGNOSIS — M25561 Pain in right knee: Secondary | ICD-10-CM | POA: Insufficient documentation

## 2014-08-04 DIAGNOSIS — M25562 Pain in left knee: Secondary | ICD-10-CM | POA: Insufficient documentation

## 2014-08-04 DIAGNOSIS — M79642 Pain in left hand: Secondary | ICD-10-CM

## 2014-08-04 DIAGNOSIS — M79641 Pain in right hand: Secondary | ICD-10-CM

## 2014-08-04 MED ORDER — MORPHINE SULFATE ER 30 MG PO TBCR: 30.0000 mg | EXTENDED_RELEASE_TABLET | Freq: Three times a day (TID) | ORAL | Status: DC

## 2014-08-04 MED ORDER — MORPHINE SULFATE 15 MG PO TABS: ORAL_TABLET | ORAL | Status: DC

## 2014-08-04 NOTE — Progress Notes (Signed)
Subjective:    Patient ID: [name], female    DOB: [birth year], 44 y.o.   MRN: [id number]  HPI: [name] is a 44 year old female who returns for follow up for chronic pain and medication refill. She says her pain is located in her bilateral hands and bilateral feet.She rates her pain 8. Her current exercise regime is performing stretching exercises using the bands . She arrived with elevated blood pressure, blood pressure re-checked 153/73. Encouraged to monitor she's on vasotec and is compliant with her medications.   She is scheduled for endoscopy and colonoscopy she sates in June.  Pain Inventory Average Pain 8 Pain Right Now 8 My pain is constant, sharp, burning, dull, stabbing, tingling and aching  In the last 24 hours, has pain interfered with the following? General activity 10 Relation with others 10 Enjoyment of life 10 What TIME of day is your pain at its worst? evening, night Sleep (in general) Poor  Pain is worse with: walking, bending, sitting, standing and some activites Pain improves with: rest, medication and message Relief from Meds: 7  Mobility walk without assistance walk with assistance use a cane how many minutes can you walk? 5 ability to climb steps?  no needs help with transfers transfers alone Do you have any goals in this area?  yes  Function disabled: date disabled . I need assistance with the following:  bathing, meal prep, household duties and shopping  Neuro/Psych weakness numbness tremor tingling trouble walking spasms dizziness depression anxiety  Prior Studies Any changes since last visit?  no  Physicians involved in your care Any changes since last visit?  yes gastroenteroligist, Dr. Oletta Lamas   History reviewed. No pertinent family history. History   Social History  . Marital Status: Legally Separated    Spouse Name: N/A  . Number of Children: N/A  . Years of Education: N/A   Social History  Main Topics  . Smoking status: Never Smoker   . Smokeless tobacco: Never Used  . Alcohol Use: Not on file  . Drug Use: Not on file  . Sexual Activity: Not on file   Other Topics Concern  . None   Social History Narrative   Past Surgical History  Procedure Laterality Date  . Cholecystectomy    . Abdominoplasty     Past Medical History  Diagnosis Date  . 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 DEPRESSION/ANXIETY 08/08/2009  . IBS 02/02/2010  . Polyneuropathy 12/04/2010   BP 148/84 mmHg  Pulse 99  Resp 14  SpO2 98%  Opioid Risk Score:   Fall Risk Score: Moderate Fall Risk (6-13 points) (patient previously educated)`1  Depression screen PHQ 2/9  Depression screen [facility] 2/9 07/01/2014 12/11/2012  Decreased Interest 2 3  Down, Depressed, Hopeless 2 3  PHQ - 2 Score 4 6  Altered sleeping 3 -  Tired, decreased energy 3 -  Change in appetite 2 -  Feeling bad or failure about yourself  3 -  Trouble concentrating 1 -  Moving slowly or fidgety/restless 0 -  Suicidal thoughts 1 -  PHQ-9 Score 17 -  Difficult doing work/chores Extremely dIfficult -     Review of Systems  Constitutional:       Night sweats  Gastrointestinal: Positive for nausea, abdominal pain, diarrhea and constipation.  Endocrine:       High blood sugar   Neurological: Positive for dizziness, tremors, weakness  and numbness.       Tingling Spasms   Psychiatric/Behavioral: Positive for dysphoric mood. The patient is nervous/anxious.   All other systems reviewed and are negative.      Objective:   Physical Exam  Constitutional: She is oriented to person, place, and time. She appears well-developed and well-nourished.  HENT:  Head: Normocephalic and atraumatic.  Neck: Normal range of motion. Neck supple.  Cardiovascular: Normal rate and regular rhythm.   Pulmonary/Chest: Effort normal and breath sounds normal.    Musculoskeletal:  Normal Muscle Bulk and Muscle Testing Reveals: Upper Extremities: Full ROM and Muscle Strength 5/5 Lower Extremities: Full ROM and Muscle Strength 5/5 Arises from chair with ease Narrow Based gait.  Neurological: She is alert and oriented to person, place, and time.  Skin: Skin is warm and dry.  Nursing note and vitals reviewed.         Assessment & Plan:  1. Severe peripheral polyneuropathy of undetermined etiology.:  Refilled: MS Contin 30 mg one tablet three times a day#90 and MSIR 15 mg one- two tablet's daily as needed # 55. 2. Bilateral progressive upper extremity hand pain. Continue with current medication regime. Tight Glucose Control. Adhere to healthy diet regime. 3. Muscle Spasms: Continue Zanaflex.    20 minutes of face to face patient care time spent during this visit. All questions were encouraged and answered.   F/U in 1 month

## 2014-08-08 ENCOUNTER — Other Ambulatory Visit: Payer: Self-pay | Admitting: Physical Medicine & Rehabilitation

## 2014-08-11 ENCOUNTER — Telehealth: Payer: Self-pay | Admitting: Family Medicine

## 2014-08-11 DIAGNOSIS — E119 Type 2 diabetes mellitus without complications: Secondary | ICD-10-CM

## 2014-08-11 NOTE — Telephone Encounter (Signed)
Blood sugar was 225 fasting, and 325- 2 hrs after eating a banana. Has really been watching what she was eating She is concerned about this Please advise

## 2014-08-11 NOTE — Telephone Encounter (Signed)
Spoke with pt and she stated that she has not been checking BS as often as she should but knows when she was here at office last time A1C was high. She stated that the last few times she checked BS have been higher as stated below. She stated that she has some dry mouth and lightheadedness but not sure if this is related to all the other meds she takes as well. Denies blurred vision. She wants to know if she can try taking Glipizide along with Metformin that she is currently on (as she took Glipizide in the past) to see if this will help with BS. Please advise.  Araminta Zorn,CMA.

## 2014-08-12 ENCOUNTER — Other Ambulatory Visit: Payer: Self-pay | Admitting: *Deleted

## 2014-08-12 MED ORDER — GLUCOSE BLOOD VI STRP: ORAL_STRIP | Status: DC

## 2014-08-12 MED ORDER — GLIPIZIDE 10 MG PO TABS: 10.0000 mg | ORAL_TABLET | Freq: Two times a day (BID) | ORAL | Status: DC

## 2014-08-12 MED ORDER — ACCU-CHEK MULTICLIX LANCETS MISC: Status: DC

## 2014-08-12 NOTE — Telephone Encounter (Signed)
Pt voiced understanding on medication instructions. Jazmin Hartsell,CMA

## 2014-08-12 NOTE — Telephone Encounter (Signed)
LM for patient to call back. Jazmin Hartsell,CMA  

## 2014-08-31 ENCOUNTER — Encounter: Payer: Medicaid Other | Attending: Physical Medicine & Rehabilitation | Admitting: Registered Nurse

## 2014-08-31 ENCOUNTER — Encounter: Payer: Self-pay | Admitting: Registered Nurse

## 2014-08-31 VITALS — BP 119/72 | HR 102 | Resp 16

## 2014-08-31 DIAGNOSIS — Z5181 Encounter for therapeutic drug level monitoring: Secondary | ICD-10-CM | POA: Insufficient documentation

## 2014-08-31 DIAGNOSIS — E1140 Type 2 diabetes mellitus with diabetic neuropathy, unspecified: Secondary | ICD-10-CM | POA: Diagnosis not present

## 2014-08-31 DIAGNOSIS — G629 Polyneuropathy, unspecified: Secondary | ICD-10-CM

## 2014-08-31 DIAGNOSIS — G894 Chronic pain syndrome: Secondary | ICD-10-CM | POA: Insufficient documentation

## 2014-08-31 DIAGNOSIS — Z79899 Other long term (current) drug therapy: Secondary | ICD-10-CM | POA: Diagnosis not present

## 2014-08-31 DIAGNOSIS — M25561 Pain in right knee: Secondary | ICD-10-CM | POA: Diagnosis present

## 2014-08-31 DIAGNOSIS — M79641 Pain in right hand: Secondary | ICD-10-CM

## 2014-08-31 DIAGNOSIS — M25562 Pain in left knee: Secondary | ICD-10-CM | POA: Diagnosis present

## 2014-08-31 DIAGNOSIS — M79642 Pain in left hand: Secondary | ICD-10-CM

## 2014-08-31 MED ORDER — MORPHINE SULFATE ER 30 MG PO TBCR: 30.0000 mg | EXTENDED_RELEASE_TABLET | Freq: Three times a day (TID) | ORAL | Status: DC

## 2014-08-31 MED ORDER — MORPHINE SULFATE 15 MG PO TABS: ORAL_TABLET | ORAL | Status: DC

## 2014-08-31 NOTE — Progress Notes (Signed)
Subjective:    Patient ID: [name], female    DOB: 05/13/1970, 44 y.o.   MRN: [id number]  HPI: [name] is a 44 year old female who returns for follow up for chronic pain and medication refill. She says her pain is located in her bilateral hands and bilateral feet.She rates her pain 8. Her current exercise regime is performing stretching exercises using the bands . Arrived tachycardic apical pulse checked 94.  Pain Inventory Average Pain 8 Pain Right Now 8 My pain is constant, sharp, burning, dull, stabbing, tingling and aching  In the last 24 hours, has pain interfered with the following? General activity 10 Relation with others 10 Enjoyment of life 10 What TIME of day is your pain at its worst? daytime and evening Sleep (in general) Fair  Pain is worse with: walking, bending, sitting, standing and some activites Pain improves with: rest and medication Relief from Meds: 9  Mobility walk without assistance walk with assistance use a cane how many minutes can you walk? 5-10 ability to climb steps?  no do you drive?  yes  Function disabled: date disabled . I need assistance with the following:  bathing, meal prep, household duties and shopping  Neuro/Psych weakness numbness tremor tingling trouble walking spasms dizziness depression anxiety  Prior Studies Any changes since last visit?  no  Physicians involved in your care Any changes since last visit?  no   History reviewed. No pertinent family history. History   Social History  . Marital Status: Legally Separated    Spouse Name: N/A  . Number of Children: N/A  . Years of Education: N/A   Social History Main Topics  . Smoking status: Never Smoker   . Smokeless tobacco: Never Used  . Alcohol Use: Not on file  . Drug Use: Not on file  . Sexual Activity: Not on file   Other Topics Concern  . None   Social History Narrative   Past Surgical History  Procedure Laterality  Date  . Cholecystectomy    . Abdominoplasty     Past Medical History  Diagnosis Date  . 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 DEPRESSION/ANXIETY 08/08/2009  . IBS 02/02/2010  . Polyneuropathy 12/04/2010   BP 119/72 mmHg  Pulse 102  Resp 16  SpO2 97%  Opioid Risk Score:   Fall Risk Score: Moderate Fall Risk (6-13 points) (previously educated and given handout)`1  Depression screen PHQ 2/9  Depression screen [facility] 2/9 07/01/2014 12/11/2012  Decreased Interest 2 3  Down, Depressed, Hopeless 2 3  PHQ - 2 Score 4 6  Altered sleeping 3 -  Tired, decreased energy 3 -  Change in appetite 2 -  Feeling bad or failure about yourself  3 -  Trouble concentrating 1 -  Moving slowly or fidgety/restless 0 -  Suicidal thoughts 1 -  PHQ-9 Score 17 -  Difficult doing work/chores Extremely dIfficult -     Review of Systems  Musculoskeletal: Positive for gait problem.       Spasms  Neurological: Positive for tremors, weakness and numbness.       Tingling  All other systems reviewed and are negative.      Objective:   Physical Exam  Constitutional: She is oriented to person, place, and time. She appears well-developed and well-nourished.  HENT:  Head: Normocephalic and atraumatic.  Neck: Normal range of motion. Neck supple.  Cardiovascular: Normal rate  and regular rhythm.   Pulmonary/Chest: Effort normal and breath sounds normal.  Musculoskeletal:  Normal Muscle Bulk and Muscle Testing Reveals: Upper Extremities: Full ROM and Muscle Strength 5/5 Lower Extremities: Full ROM and Muscle Strength 5/5 Arises from chair with ease Narrow based gait  Neurological: She is alert and oriented to person, place, and time.  Skin: Skin is warm and dry.  Psychiatric: She has a normal mood and affect.  Nursing note and vitals reviewed.         Assessment & Plan:  1. Severe peripheral polyneuropathy of  undetermined etiology.:  Refilled: MS Contin 30 mg one tablet three times a day#90 and MSIR 15 mg one- two tablet's daily as needed # 60.Second script given to accommodate scheduled appointment. 2. Bilateral progressive upper extremity hand pain. Continue with current medication regime. Tight Glucose Control. Adhere to healthy diet regime. 3. Muscle Spasms: Continue Zanaflex.    20 minutes of face to face patient care time spent during this visit. All questions were encouraged and answered.   F/U in 1 month

## 2014-09-01 ENCOUNTER — Other Ambulatory Visit: Payer: Self-pay | Admitting: Physical Medicine & Rehabilitation

## 2014-09-02 ENCOUNTER — Other Ambulatory Visit: Payer: Self-pay | Admitting: Family Medicine

## 2014-09-02 ENCOUNTER — Other Ambulatory Visit: Payer: Self-pay | Admitting: *Deleted

## 2014-09-02 LAB — PMP ALCOHOL METABOLITE (ETG): Ethyl Glucuronide (EtG): NEGATIVE ng/mL

## 2014-09-02 MED ORDER — METFORMIN HCL 1000 MG PO TABS: ORAL_TABLET | ORAL | Status: DC

## 2014-09-02 NOTE — Telephone Encounter (Signed)
Refill given for metformin x 1 month.  Pt is due for 3 month follow up in July.  Derl Barrow, RN

## 2014-09-02 NOTE — Telephone Encounter (Signed)
Pt is out of medication.  Derl Barrow, RN

## 2014-09-03 NOTE — Telephone Encounter (Signed)
Letter mailed to patient for follow up. Madiline Saffran,CMA

## 2014-09-06 LAB — OPIATES/OPIOIDS (LC/MS-MS)
CODEINE URINE: NEGATIVE ng/mL (ref <50)
Hydrocodone: NEGATIVE ng/mL (ref <50)
Hydromorphone: 134 ng/mL (ref <50)
Morphine Urine: >50000 ng/mL (ref <50)
Norhydrocodone, Ur: NEGATIVE ng/mL (ref <50)
Noroxycodone, Ur: NEGATIVE ng/mL (ref <50)
OXYMORPHONE, URINE: NEGATIVE ng/mL (ref <50)
Oxycodone, ur: NEGATIVE ng/mL (ref <50)

## 2014-09-06 LAB — BENZODIAZEPINES (GC/LC/MS), URINE
ALPRAZOLAMU: NEGATIVE ng/mL (ref <25)
Clonazepam metabolite (GC/LC/MS), ur confirm: 356 ng/mL — AB (ref <25)
FLURAZEPAMU: NEGATIVE ng/mL (ref <50)
LORAZEPAMU: NEGATIVE ng/mL (ref <50)
Midazolam (GC/LC/MS), ur confirm: NEGATIVE ng/mL (ref <50)
Nordiazepam (GC/LC/MS), ur confirm: NEGATIVE ng/mL (ref <50)
Oxazepam (GC/LC/MS), ur confirm: NEGATIVE ng/mL (ref <50)
TRIAZOLAMU: NEGATIVE ng/mL (ref <50)
Temazepam (GC/LC/MS), ur confirm: NEGATIVE ng/mL (ref <50)

## 2014-09-07 LAB — PRESCRIPTION MONITORING PROFILE (SOLSTAS)
AMPHETAMINE/METH: NEGATIVE ng/mL
BUPRENORPHINE, URINE: NEGATIVE ng/mL
Barbiturate Screen, Urine: NEGATIVE ng/mL
CANNABINOID SCRN UR: NEGATIVE ng/mL
CARISOPRODOL, URINE: NEGATIVE ng/mL
COCAINE METABOLITES: NEGATIVE ng/mL
CREATININE, URINE: 142.14 mg/dL (ref >20.0)
ECSTASY: NEGATIVE ng/mL
Fentanyl, Ur: NEGATIVE ng/mL
MEPERIDINE UR: NEGATIVE ng/mL
Methadone Screen, Urine: NEGATIVE ng/mL
Nitrites, Initial: NEGATIVE ug/mL
OXYCODONE SCRN UR: NEGATIVE ng/mL
Propoxyphene: NEGATIVE ng/mL
TRAMADOL UR: NEGATIVE ng/mL
Tapentadol, urine: NEGATIVE ng/mL
Zolpidem, Urine: NEGATIVE ng/mL
pH, Initial: 5.2 pH (ref 4.5–8.9)

## 2014-09-08 NOTE — Progress Notes (Signed)
Urine drug screen for this encounter is consistent for prescribed medication 

## 2014-09-15 ENCOUNTER — Ambulatory Visit: Payer: Medicaid Other | Admitting: Obstetrics

## 2014-09-26 ENCOUNTER — Other Ambulatory Visit: Payer: Self-pay | Admitting: Family Medicine

## 2014-09-26 DIAGNOSIS — E118 Type 2 diabetes mellitus with unspecified complications: Secondary | ICD-10-CM

## 2014-10-11 ENCOUNTER — Ambulatory Visit: Payer: Medicaid Other | Admitting: Family Medicine

## 2014-10-12 ENCOUNTER — Encounter: Payer: Medicaid Other | Admitting: Registered Nurse

## 2014-10-13 ENCOUNTER — Encounter: Payer: Self-pay | Admitting: *Deleted

## 2014-10-20 ENCOUNTER — Encounter: Payer: Medicaid Other | Attending: Physical Medicine & Rehabilitation | Admitting: Registered Nurse

## 2014-10-20 ENCOUNTER — Encounter: Payer: Self-pay | Admitting: Registered Nurse

## 2014-10-20 VITALS — BP 128/78 | HR 94 | Resp 14

## 2014-10-20 DIAGNOSIS — Z5181 Encounter for therapeutic drug level monitoring: Secondary | ICD-10-CM | POA: Diagnosis present

## 2014-10-20 DIAGNOSIS — G894 Chronic pain syndrome: Secondary | ICD-10-CM | POA: Diagnosis not present

## 2014-10-20 DIAGNOSIS — E1140 Type 2 diabetes mellitus with diabetic neuropathy, unspecified: Secondary | ICD-10-CM

## 2014-10-20 DIAGNOSIS — M25562 Pain in left knee: Secondary | ICD-10-CM | POA: Insufficient documentation

## 2014-10-20 DIAGNOSIS — G629 Polyneuropathy, unspecified: Secondary | ICD-10-CM | POA: Diagnosis not present

## 2014-10-20 DIAGNOSIS — Z79899 Other long term (current) drug therapy: Secondary | ICD-10-CM | POA: Insufficient documentation

## 2014-10-20 DIAGNOSIS — M25561 Pain in right knee: Secondary | ICD-10-CM | POA: Insufficient documentation

## 2014-10-20 MED ORDER — MORPHINE SULFATE ER 30 MG PO TBCR: 30.0000 mg | EXTENDED_RELEASE_TABLET | Freq: Three times a day (TID) | ORAL | Status: DC

## 2014-10-20 MED ORDER — MORPHINE SULFATE 15 MG PO TABS
ORAL_TABLET | ORAL | Status: DC
Start: 2014-10-20 — End: 2014-11-22

## 2014-10-20 NOTE — Progress Notes (Signed)
Subjective:    Patient ID: [name], female    DOB: 07-29-1970, 44 y.o.   MRN: [id number]  HPI : [name] is a 44 year old female who returns for follow up for chronic pain and medication refill. She says her pain is located in her bilateral hands and bilateral feet.She rates her pain 7. Her current exercise regime is walking. Also states she had a fall last month she was walking in the storage unit lost her footing and fell on her buttocks. Her boyfriend helped her up didn't seek medical attention. Educated on Falls Prevention she verbalizes understanding. Also discuss being on time for appointments and adhere to scheduled appointments she verbalizes understanding. Realizes cancellation of appointment's will result in discharge from office she verbalizes understanding.   Pain Inventory Average Pain 7 Pain Right Now 7 My pain is constant, sharp, burning, dull, stabbing, tingling and aching  In the last 24 hours, has pain interfered with the following? General activity 9 Relation with others 9 Enjoyment of life 9 What TIME of day is your pain at its worst? daytime and evening Sleep (in general) Poor  Pain is worse with: walking, bending, sitting, standing and some activites Pain improves with: medication Relief from Meds: 6  Mobility walk without assistance how many minutes can you walk? 10 ability to climb steps?  no do you drive?  no  Function disabled: date disabled .  Neuro/Psych weakness numbness tremor tingling trouble walking spasms dizziness depression anxiety  Prior Studies Any changes since last visit?  no  Physicians involved in your care Any changes since last visit?  no   Family History  Problem Relation Age of Onset  . Stroke Maternal Grandmother   . Colon cancer Maternal Grandmother   . Heart Problems Paternal Grandmother   . Dementia Paternal Grandmother   . Heart attack Paternal Grandfather    History   Social  History  . Marital Status: Legally Separated    Spouse Name: N/A  . Number of Children: N/A  . Years of Education: N/A   Social History Main Topics  . Smoking status: Never Smoker   . Smokeless tobacco: Never Used  . Alcohol Use: Not on file  . Drug Use: Not on file  . Sexual Activity: Not on file   Other Topics Concern  . None   Social History Narrative   Past Surgical History  Procedure Laterality Date  . Cholecystectomy    . Abdominoplasty     Past Medical History  Diagnosis Date  . 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 DEPRESSION/ANXIETY 08/08/2009  . IBS 02/02/2010  . Polyneuropathy 12/04/2010   BP 128/78 mmHg  Pulse 94  Resp 14  SpO2 97%  Opioid Risk Score:   Fall Risk Score:  `1  Depression screen PHQ 2/9  Depression screen [facility] 2/9 07/01/2014 12/11/2012  Decreased Interest 2 3  Down, Depressed, Hopeless 2 3  PHQ - 2 Score 4 6  Altered sleeping 3 -  Tired, decreased energy 3 -  Change in appetite 2 -  Feeling bad or failure about yourself  3 -  Trouble concentrating 1 -  Moving slowly or fidgety/restless 0 -  Suicidal thoughts 1 -  PHQ-9 Score 17 -  Difficult doing work/chores Extremely dIfficult -     Review of Systems  Constitutional:       Night sweats  HENT: Negative.   Eyes:  Negative.   Respiratory: Negative.   Cardiovascular: Negative.   Gastrointestinal: Positive for nausea, vomiting, abdominal pain, diarrhea and constipation.  Endocrine: Negative.   Genitourinary: Negative.   Musculoskeletal: Positive for myalgias and arthralgias.       Neuropathic pain bilateral hands/feet Chronic knee pain bilateral Left shoulder and arm pain  Skin: Negative.   Allergic/Immunologic: Negative.   Neurological: Positive for dizziness, tremors, weakness and numbness.       Trouble walking, tingling, spasms  Hematological: Negative.   Psychiatric/Behavioral: Positive for  dysphoric mood. The patient is nervous/anxious.        Objective:   Physical Exam  Constitutional: She is oriented to person, place, and time. She appears well-developed and well-nourished.  HENT:  Head: Normocephalic and atraumatic.  Neck: Normal range of motion. Neck supple.  Cardiovascular: Normal rate and regular rhythm.   Pulmonary/Chest: Effort normal and breath sounds normal.  Musculoskeletal:  Normal Muscle Bulk and Muscle Testing Reveals: Upper Extremities: Full ROM and Muscle Strength 5/5 Lower Extremities: Full ROM and Muscle Strength 5/5 Arises from chair with ease Narrow Based Gait  Neurological: She is alert and oriented to person, place, and time.  Skin: Skin is warm and dry.  Psychiatric: She has a normal mood and affect.  Nursing note and vitals reviewed.         Assessment & Plan:  1. Severe peripheral polyneuropathy of undetermined etiology.:  Refilled: MS Contin 30 mg one tablet three times a day#90 and MSIR 15 mg one- two tablet's daily as needed # 60. 2. Bilateral progressive upper extremity hand pain. Continue with current medication regime. Tight Glucose Control. Adhere to healthy diet regime. 3. Muscle Spasms: Continue Zanaflex.    20 minutes of face to face patient care time spent during this visit. All questions were encouraged and answered.   F/U in 1 month

## 2014-10-22 ENCOUNTER — Other Ambulatory Visit: Payer: Self-pay | Admitting: *Deleted

## 2014-10-22 ENCOUNTER — Other Ambulatory Visit: Payer: Self-pay | Admitting: Family Medicine

## 2014-10-22 MED ORDER — ENALAPRIL MALEATE 20 MG PO TABS: ORAL_TABLET | ORAL | Status: DC

## 2014-10-25 ENCOUNTER — Ambulatory Visit: Payer: Medicaid Other | Admitting: Obstetrics

## 2014-11-15 ENCOUNTER — Ambulatory Visit: Payer: Medicaid Other | Admitting: Family Medicine

## 2014-11-15 ENCOUNTER — Other Ambulatory Visit: Payer: Self-pay | Admitting: *Deleted

## 2014-11-15 DIAGNOSIS — E119 Type 2 diabetes mellitus without complications: Secondary | ICD-10-CM

## 2014-11-15 MED ORDER — GLIPIZIDE 10 MG PO TABS: 10.0000 mg | ORAL_TABLET | Freq: Two times a day (BID) | ORAL | Status: DC

## 2014-11-18 ENCOUNTER — Other Ambulatory Visit: Payer: Self-pay | Admitting: Physical Medicine & Rehabilitation

## 2014-11-22 ENCOUNTER — Encounter: Payer: Medicaid Other | Attending: Physical Medicine & Rehabilitation | Admitting: Registered Nurse

## 2014-11-22 ENCOUNTER — Encounter: Payer: Self-pay | Admitting: Registered Nurse

## 2014-11-22 VITALS — BP 142/83 | HR 106

## 2014-11-22 DIAGNOSIS — Z5181 Encounter for therapeutic drug level monitoring: Secondary | ICD-10-CM | POA: Diagnosis present

## 2014-11-22 DIAGNOSIS — E1140 Type 2 diabetes mellitus with diabetic neuropathy, unspecified: Secondary | ICD-10-CM | POA: Diagnosis not present

## 2014-11-22 DIAGNOSIS — M25562 Pain in left knee: Secondary | ICD-10-CM | POA: Insufficient documentation

## 2014-11-22 DIAGNOSIS — G629 Polyneuropathy, unspecified: Secondary | ICD-10-CM | POA: Diagnosis not present

## 2014-11-22 DIAGNOSIS — M25561 Pain in right knee: Secondary | ICD-10-CM | POA: Insufficient documentation

## 2014-11-22 DIAGNOSIS — G894 Chronic pain syndrome: Secondary | ICD-10-CM | POA: Diagnosis present

## 2014-11-22 DIAGNOSIS — Z79899 Other long term (current) drug therapy: Secondary | ICD-10-CM | POA: Diagnosis present

## 2014-11-22 MED ORDER — MORPHINE SULFATE ER 30 MG PO TBCR: 30.0000 mg | EXTENDED_RELEASE_TABLET | Freq: Three times a day (TID) | ORAL | Status: DC

## 2014-11-22 MED ORDER — TIZANIDINE HCL 2 MG PO TABS: ORAL_TABLET | ORAL | Status: DC

## 2014-11-22 MED ORDER — CLONAZEPAM 2 MG PO TABS: 2.0000 mg | ORAL_TABLET | Freq: Every day | ORAL | Status: DC

## 2014-11-22 MED ORDER — MORPHINE SULFATE 15 MG PO TABS: ORAL_TABLET | ORAL | Status: DC

## 2014-11-22 NOTE — Progress Notes (Signed)
Subjective:    Patient ID: [name], female    DOB: 05-01-1970, 44 y.o.   MRN: [id number]  HPI: [name] is a 44 year old female who returns for follow up for chronic pain and medication refill. She says her pain is located in her bilateral hands and bilateral feet.She rates her pain 10. Her current exercise regime is walking. Also states she has become active. Arrived tachycardic apical pulse 100.  Pain Inventory Average Pain 10 Pain Right Now na My pain is constant, sharp, burning, dull, stabbing, tingling and aching  In the last 24 hours, has pain interfered with the following? General activity 10 Relation with others 10 Enjoyment of life 10 What TIME of day is your pain at its worst? morning, daytime, evening, night Sleep (in general) Poor  Pain is worse with: walking, bending and standing Pain improves with: rest and medication Relief from Meds: na  Mobility walk without assistance walk with assistance use a cane how many minutes can you walk? 95min ability to climb steps?  no do you drive?  no Do you have any goals in this area?  yes  Function disabled: date disabled na I need assistance with the following:  meal prep and household duties Do you have any goals in this area?  yes  Neuro/Psych weakness numbness tremor tingling trouble walking spasms dizziness anxiety  Prior Studies Any changes since last visit?  no  Physicians involved in your care Any changes since last visit?  no   Family History  Problem Relation Age of Onset  . Stroke Maternal Grandmother   . Colon cancer Maternal Grandmother   . Heart Problems Paternal Grandmother   . Dementia Paternal Grandmother   . Heart attack Paternal Grandfather    Social History   Social History  . Marital Status: Legally Separated    Spouse Name: N/A  . Number of Children: N/A  . Years of Education: N/A   Social History Main Topics  . Smoking status: Never Smoker   .  Smokeless tobacco: Never Used  . Alcohol Use: None  . Drug Use: None  . Sexual Activity: Not Asked   Other Topics Concern  . None   Social History Narrative   Past Surgical History  Procedure Laterality Date  . Cholecystectomy    . Abdominoplasty     Past Medical History  Diagnosis Date  . 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 DEPRESSION/ANXIETY 08/08/2009  . IBS 02/02/2010  . Polyneuropathy 12/04/2010   BP 142/83 mmHg  Pulse 106  SpO2 96%  Opioid Risk Score:   Fall Risk Score:  `1  Depression screen PHQ 2/9  Depression screen [facility] 2/9 11/22/2014 07/01/2014 12/11/2012  Decreased Interest 3 2 3   Down, Depressed, Hopeless 3 2 3   PHQ - 2 Score 6 4 6   Altered sleeping - 3 -  Tired, decreased energy - 3 -  Change in appetite - 2 -  Feeling bad or failure about yourself  - 3 -  Trouble concentrating - 1 -  Moving slowly or fidgety/restless - 0 -  Suicidal thoughts - 1 -  PHQ-9 Score - 17 -  Difficult doing work/chores - Extremely dIfficult -      Review of Systems  Constitutional:       Night sweats  Gastrointestinal: Positive for nausea, abdominal pain, diarrhea and constipation.  All other systems reviewed and are negative.  Objective:   Physical Exam  Constitutional: She is oriented to person, place, and time. She appears well-developed and well-nourished.  HENT:  Head: Normocephalic and atraumatic.  Neck: Normal range of motion. Neck supple.  Cardiovascular: Normal rate and regular rhythm.   Pulmonary/Chest: Effort normal and breath sounds normal.  Musculoskeletal:  Normal Muscle Bulk and Muscle Testing Reveals: Upper Extremities: Full ROM and Muscle Strength 5/5 Lower Extremities: Full ROM and Muscle Strength 5/5 Arises from chair with ease Narrow Based Gait  Neurological: She is alert and oriented to person, place, and time.  Skin: Skin is warm and dry.  Psychiatric:  She has a normal mood and affect.  Nursing note and vitals reviewed.         Assessment & Plan:  1. Severe peripheral polyneuropathy of undetermined etiology.:  Refilled: MS Contin 30 mg one tablet three times a day#90 and MSIR 15 mg one- two tablet's daily as needed # 60. 2. Bilateral progressive upper extremity hand pain. Continue with current medication regime. Tight Glucose Control. Adhere to healthy diet regime. 3. Muscle Spasms: Continue Zanaflex.    20 minutes of face to face patient care time spent during this visit. All questions were encouraged and answered.   F/U in 1 month

## 2014-11-24 ENCOUNTER — Encounter: Payer: Self-pay | Admitting: Internal Medicine

## 2014-12-13 ENCOUNTER — Encounter: Payer: Medicaid Other | Admitting: Family Medicine

## 2014-12-17 ENCOUNTER — Encounter: Payer: Medicaid Other | Attending: Physical Medicine & Rehabilitation | Admitting: Registered Nurse

## 2014-12-17 ENCOUNTER — Encounter: Payer: Self-pay | Admitting: Registered Nurse

## 2014-12-17 VITALS — BP 141/71 | HR 89

## 2014-12-17 DIAGNOSIS — G629 Polyneuropathy, unspecified: Secondary | ICD-10-CM | POA: Diagnosis not present

## 2014-12-17 DIAGNOSIS — Z79899 Other long term (current) drug therapy: Secondary | ICD-10-CM | POA: Diagnosis present

## 2014-12-17 DIAGNOSIS — G894 Chronic pain syndrome: Secondary | ICD-10-CM | POA: Diagnosis not present

## 2014-12-17 DIAGNOSIS — Z5181 Encounter for therapeutic drug level monitoring: Secondary | ICD-10-CM | POA: Insufficient documentation

## 2014-12-17 DIAGNOSIS — M25561 Pain in right knee: Secondary | ICD-10-CM | POA: Diagnosis present

## 2014-12-17 DIAGNOSIS — M25562 Pain in left knee: Secondary | ICD-10-CM | POA: Diagnosis present

## 2014-12-17 DIAGNOSIS — E1140 Type 2 diabetes mellitus with diabetic neuropathy, unspecified: Secondary | ICD-10-CM | POA: Diagnosis not present

## 2014-12-17 MED ORDER — MORPHINE SULFATE ER 30 MG PO TBCR: 30.0000 mg | EXTENDED_RELEASE_TABLET | Freq: Three times a day (TID) | ORAL | Status: DC

## 2014-12-17 MED ORDER — MORPHINE SULFATE 15 MG PO TABS: ORAL_TABLET | ORAL | Status: DC

## 2014-12-17 NOTE — Progress Notes (Signed)
Subjective:    Patient ID: [name], female    DOB: 11-Aug-1970, 44 y.o.   MRN: [id number]  HPI: [name] is a 44 year old female who returns for follow up for chronic pain and medication refill. She says her pain is located in her bilateral hands and bilateral feet.She rates her pain 8. Her current exercise regime is walking.  Pain Inventory Average Pain 8 Pain Right Now 8 My pain is constant, sharp, burning, dull, stabbing, tingling and aching  In the last 24 hours, has pain interfered with the following? General activity 9 Relation with others 9 Enjoyment of life 9 What TIME of day is your pain at its worst? evening, night Sleep (in general) Poor  Pain is worse with: na Pain improves with: na Relief from Meds: na  Mobility walk with assistance use a cane how many minutes can you walk? 5 ability to climb steps?  no do you drive?  no needs help with transfers transfers alone Do you have any goals in this area?  yes  Function I need assistance with the following:  bathing, meal prep, household duties and shopping Do you have any goals in this area?  yes  Neuro/Psych weakness numbness tremor tingling trouble walking spasms dizziness depression anxiety  Prior Studies Any changes since last visit?  no  Physicians involved in your care Any changes since last visit?  no   Family History  Problem Relation Age of Onset  . Stroke Maternal Grandmother   . Colon cancer Maternal Grandmother   . Heart Problems Paternal Grandmother   . Dementia Paternal Grandmother   . Heart attack Paternal Grandfather    Social History   Social History  . Marital Status: Legally Separated    Spouse Name: N/A  . Number of Children: N/A  . Years of Education: N/A   Social History Main Topics  . Smoking status: Never Smoker   . Smokeless tobacco: Never Used  . Alcohol Use: None  . Drug Use: None  . Sexual Activity: Not Asked   Other Topics  Concern  . None   Social History Narrative   Past Surgical History  Procedure Laterality Date  . Cholecystectomy    . Abdominoplasty     Past Medical History  Diagnosis Date  . 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 DEPRESSION/ANXIETY 08/08/2009  . IBS 02/02/2010  . Polyneuropathy 12/04/2010   BP 141/71 mmHg  Pulse 89  SpO2 99%  Opioid Risk Score:   Fall Risk Score:  `1  Depression screen PHQ 2/9  Depression screen [facility] 2/9 12/17/2014 11/22/2014 07/01/2014 12/11/2012  Decreased Interest 3 3 2 3   Down, Depressed, Hopeless 3 3 2 3   PHQ - 2 Score 6 6 4 6   Altered sleeping - - 3 -  Tired, decreased energy - - 3 -  Change in appetite - - 2 -  Feeling bad or failure about yourself  - - 3 -  Trouble concentrating - - 1 -  Moving slowly or fidgety/restless - - 0 -  Suicidal thoughts - - 1 -  PHQ-9 Score - - 17 -  Difficult doing work/chores - - Extremely dIfficult -     Review of Systems  All other systems reviewed and are negative.      Objective:   Physical Exam  Constitutional: She is oriented to person, place, and time. She appears well-developed and well-nourished.  HENT:  Head: Normocephalic and atraumatic.  Neck: Normal range of motion. Neck supple.  Cardiovascular: Normal rate and regular rhythm.   Pulmonary/Chest: Effort normal and breath sounds normal.  Musculoskeletal:  Normal Muscle Bulk and muscle testing Reveals: Upper Extremities: Full ROM and Muscle Strength 5/5 Lower Extremities: Full ROM and Muscle Strength 5/5 Arises from chair with ease Narrow Based Gait  Neurological: She is alert and oriented to person, place, and time.  Skin: Skin is warm and dry.  Psychiatric: She has a normal mood and affect.  Nursing note and vitals reviewed.         Assessment & Plan:  1. Severe peripheral polyneuropathy of undetermined etiology.:  Refilled: MS Contin 30 mg one tablet  three times a day#90 and MSIR 15 mg one- two tablet's daily as needed # 60. 2. Bilateral progressive upper extremity hand pain. Continue with current medication regime. Tight Glucose Control. Adhere to healthy diet regime. 3. Muscle Spasms: Continue Zanaflex.    20 minutes of face to face patient care time spent during this visit. All questions were encouraged and answered.   F/U in 1 month

## 2014-12-27 ENCOUNTER — Other Ambulatory Visit: Payer: Self-pay | Admitting: *Deleted

## 2014-12-27 MED ORDER — PRAVASTATIN SODIUM 40 MG PO TABS: ORAL_TABLET | ORAL | Status: DC

## 2015-01-02 ENCOUNTER — Other Ambulatory Visit: Payer: Self-pay | Admitting: Family Medicine

## 2015-01-05 ENCOUNTER — Other Ambulatory Visit: Payer: Self-pay | Admitting: Family Medicine

## 2015-01-05 MED ORDER — METFORMIN HCL 1000 MG PO TABS: ORAL_TABLET | ORAL | Status: DC

## 2015-01-05 NOTE — Telephone Encounter (Signed)
Patient has an appt on 02/01/15 with you. Jazmin Hartsell,CMA

## 2015-01-17 ENCOUNTER — Encounter: Payer: Self-pay | Admitting: Registered Nurse

## 2015-01-17 ENCOUNTER — Other Ambulatory Visit: Payer: Self-pay | Admitting: Family Medicine

## 2015-01-17 ENCOUNTER — Encounter: Payer: Medicaid Other | Attending: Physical Medicine & Rehabilitation | Admitting: Registered Nurse

## 2015-01-17 VITALS — BP 149/77 | HR 90 | Resp 14

## 2015-01-17 DIAGNOSIS — G8929 Other chronic pain: Secondary | ICD-10-CM | POA: Diagnosis not present

## 2015-01-17 DIAGNOSIS — G894 Chronic pain syndrome: Secondary | ICD-10-CM | POA: Insufficient documentation

## 2015-01-17 DIAGNOSIS — M25561 Pain in right knee: Secondary | ICD-10-CM | POA: Insufficient documentation

## 2015-01-17 DIAGNOSIS — Z79899 Other long term (current) drug therapy: Secondary | ICD-10-CM | POA: Diagnosis present

## 2015-01-17 DIAGNOSIS — Z5181 Encounter for therapeutic drug level monitoring: Secondary | ICD-10-CM

## 2015-01-17 DIAGNOSIS — G629 Polyneuropathy, unspecified: Secondary | ICD-10-CM

## 2015-01-17 DIAGNOSIS — M25562 Pain in left knee: Secondary | ICD-10-CM | POA: Insufficient documentation

## 2015-01-17 DIAGNOSIS — E1140 Type 2 diabetes mellitus with diabetic neuropathy, unspecified: Secondary | ICD-10-CM | POA: Diagnosis not present

## 2015-01-17 MED ORDER — MORPHINE SULFATE ER 30 MG PO TBCR: 30.0000 mg | EXTENDED_RELEASE_TABLET | Freq: Three times a day (TID) | ORAL | Status: DC

## 2015-01-17 MED ORDER — MORPHINE SULFATE 15 MG PO TABS: ORAL_TABLET | ORAL | Status: DC

## 2015-01-17 NOTE — Progress Notes (Signed)
Subjective:    Patient ID: [name], female    DOB: 12-Jun-1970, 44 y.o.   MRN: [id number]  HPI: [name] is a 44 year old female who returns for follow up for chronic pain and medication refill. She says her pain is located in her bilateral hands and bilateral feet.She rates her pain 7. Her current exercise regime is walking. Ms Contin and MSIR script discarded due to entry error.  Pain Inventory Average Pain 8 Pain Right Now 7 My pain is constant, sharp, burning, dull, stabbing, tingling and aching  In the last 24 hours, has pain interfered with the following? General activity 9 Relation with others 9 Enjoyment of life 9 What TIME of day is your pain at its worst? morning, evening  Sleep (in general) Fair  Pain is worse with: walking, bending, sitting, standing and some activites Pain improves with: medication Relief from Meds: 7  Mobility walk without assistance walk with assistance use a cane how many minutes can you walk? 5-10 ability to climb steps?  no do you drive?  no needs help with transfers transfers alone Do you have any goals in this area?  yes  Function disabled: date disabled . I need assistance with the following:  toileting, meal prep, household duties and shopping Do you have any goals in this area?  yes  Neuro/Psych weakness numbness tremor tingling trouble walking spasms dizziness depression anxiety  Prior Studies Any changes since last visit?  no  Physicians involved in your care Any changes since last visit?  no   Family History  Problem Relation Age of Onset  . Stroke Maternal Grandmother   . Colon cancer Maternal Grandmother   . Heart Problems Paternal Grandmother   . Dementia Paternal Grandmother   . Heart attack Paternal Grandfather    Social History   Social History  . Marital Status: Legally Separated    Spouse Name: N/A  . Number of Children: N/A  . Years of Education: N/A   Social  History Main Topics  . Smoking status: Never Smoker   . Smokeless tobacco: Never Used  . Alcohol Use: None  . Drug Use: None  . Sexual Activity: Not Asked   Other Topics Concern  . None   Social History Narrative   Past Surgical History  Procedure Laterality Date  . Cholecystectomy    . Abdominoplasty     Past Medical History  Diagnosis Date  . 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 DEPRESSION/ANXIETY 08/08/2009  . IBS 02/02/2010  . Polyneuropathy ([place]) 12/04/2010   BP 149/77 mmHg  Pulse 90  Resp 14  SpO2 91%  Opioid Risk Score:   Fall Risk Score:  `1  Depression screen PHQ 2/9  Depression screen [facility] 2/9 12/17/2014 11/22/2014 07/01/2014 12/11/2012  Decreased Interest 3 3 2 3   Down, Depressed, Hopeless 3 3 2 3   PHQ - 2 Score 6 6 4 6   Altered sleeping - - 3 -  Tired, decreased energy - - 3 -  Change in appetite - - 2 -  Feeling bad or failure about yourself  - - 3 -  Trouble concentrating - - 1 -  Moving slowly or fidgety/restless - - 0 -  Suicidal thoughts - - 1 -  PHQ-9 Score - - 17 -  Difficult doing work/chores - - Extremely dIfficult -     Review of Systems  Constitutional: Positive for diaphoresis and  unexpected weight change.  Gastrointestinal: Positive for nausea, vomiting, abdominal pain, diarrhea and constipation.  Musculoskeletal: Positive for gait problem.  Neurological: Positive for dizziness, tremors, weakness and numbness.       Tingling Spasms   Psychiatric/Behavioral: Positive for dysphoric mood. The patient is nervous/anxious.   All other systems reviewed and are negative.      Objective:   Physical Exam  Constitutional: She is oriented to person, place, and time. She appears well-developed and well-nourished.  HENT:  Head: Normocephalic and atraumatic.  Neck: Normal range of motion. Neck supple.  Cardiovascular: Normal rate and regular rhythm.     Pulmonary/Chest: Effort normal and breath sounds normal.  Musculoskeletal:  Normal Muscle Bulk and Muscle Testing Reveals: Upper Extremities: Full ROM and Muscle Strength 5/5 Lower Extremities: Full ROM and Muscle Strength 5/5 Arises from chair slowly Antalgic Gait  Neurological: She is alert and oriented to person, place, and time.  Skin: Skin is warm and dry.  Psychiatric: She has a normal mood and affect.  Nursing note and vitals reviewed.         Assessment & Plan:  1. Severe peripheral polyneuropathy of undetermined etiology.:  Refilled: MS Contin 30 mg one tablet three times a day#90 and MSIR 15 mg one- two tablet's daily as needed # 60. 2. Bilateral progressive upper extremity hand pain. Continue with current medication regime. Tight Glucose Control. Adhere to healthy diet regime. 3. Muscle Spasms: Continue Zanaflex.    20 minutes of face to face patient care time spent during this visit. All questions were encouraged and answered.   F/U in 1 month

## 2015-01-18 ENCOUNTER — Other Ambulatory Visit: Payer: Self-pay | Admitting: Physical Medicine & Rehabilitation

## 2015-01-18 ENCOUNTER — Other Ambulatory Visit: Payer: Self-pay | Admitting: Family Medicine

## 2015-01-18 NOTE — Telephone Encounter (Signed)
Will forward to MD.  Patient was supposed to follow up before any refills last month but didn't show up.  She is scheduled for 02/01/15 with PCP. Jazmin Hartsell,CMA

## 2015-01-19 LAB — PMP ALCOHOL METABOLITE (ETG): Ethyl Glucuronide (EtG): NEGATIVE ng/mL

## 2015-01-22 LAB — BENZODIAZEPINES (GC/LC/MS), URINE
Alprazolam metabolite (GC/LC/MS), ur confirm: NEGATIVE ng/mL (ref <25)
CLONAZEPAU: 399 ng/mL — AB (ref <25)
FLURAZEPAMU: NEGATIVE ng/mL (ref <50)
Lorazepam (GC/LC/MS), ur confirm: NEGATIVE ng/mL (ref <50)
Midazolam (GC/LC/MS), ur confirm: NEGATIVE ng/mL (ref <50)
NORDIAZEPAMU: NEGATIVE ng/mL (ref <50)
Oxazepam (GC/LC/MS), ur confirm: NEGATIVE ng/mL (ref <50)
TRIAZOLAMU: NEGATIVE ng/mL (ref <50)
Temazepam (GC/LC/MS), ur confirm: NEGATIVE ng/mL (ref <50)

## 2015-01-22 LAB — OPIATES/OPIOIDS (LC/MS-MS)
Codeine Urine: NEGATIVE ng/mL (ref <50)
HYDROCODONE: NEGATIVE ng/mL (ref <50)
HYDROMORPHONE: NEGATIVE ng/mL — AB (ref <50)
Morphine Urine: 37708 ng/mL (ref <50)
NORHYDROCODONE, UR: NEGATIVE ng/mL (ref <50)
NOROXYCODONE, UR: NEGATIVE ng/mL (ref <50)
OXYCODONE, UR: NEGATIVE ng/mL (ref <50)
Oxymorphone: NEGATIVE ng/mL (ref <50)

## 2015-01-25 LAB — PRESCRIPTION MONITORING PROFILE (SOLSTAS)
Amphetamine/Meth: NEGATIVE ng/mL
BARBITURATE SCREEN, URINE: NEGATIVE ng/mL
Buprenorphine, Urine: NEGATIVE ng/mL
CARISOPRODOL, URINE: NEGATIVE ng/mL
COCAINE METABOLITES: NEGATIVE ng/mL
Cannabinoid Scrn, Ur: NEGATIVE ng/mL
Creatinine, Urine: 113.66 mg/dL (ref >20.0)
ECSTASY: NEGATIVE ng/mL
FENTANYL URINE: NEGATIVE ng/mL
METHADONE SCREEN, URINE: NEGATIVE ng/mL
Meperidine, Ur: NEGATIVE ng/mL
NITRITES URINE, INITIAL: NEGATIVE ug/mL
OXYCODONE SCRN UR: NEGATIVE ng/mL
PROPOXYPHENE: NEGATIVE ng/mL
TAPENTADOLUR: NEGATIVE ng/mL
TRAMADOL UR: NEGATIVE ng/mL
Zolpidem, Urine: NEGATIVE ng/mL
pH, Initial: 4.9 pH (ref 4.5–8.9)

## 2015-01-26 ENCOUNTER — Telehealth: Payer: Self-pay | Admitting: *Deleted

## 2015-01-26 NOTE — Telephone Encounter (Signed)
Return Ms. Hunkele called no answer. Left message to return the call.

## 2015-01-26 NOTE — Telephone Encounter (Signed)
Call back for [facility], pt reports that new schedule for taking her pain medication is not making a difference.  Please call pt back

## 2015-01-27 ENCOUNTER — Telehealth: Payer: Self-pay | Admitting: Registered Nurse

## 2015-01-27 NOTE — Telephone Encounter (Signed)
Spoke with Dr. Letta Pate regarding [name] question. At this time medications will remain the same. We will repeat her PHQ-9 . Placed a call to Ms. Mehringer left message to return the call.

## 2015-01-27 NOTE — Telephone Encounter (Signed)
[name] return the call. She is aware no changes will be made to analgesics and verbalizes understanding. She has an appointment with Dr. Letta Pate on 11/21.

## 2015-02-01 ENCOUNTER — Encounter: Payer: Self-pay | Admitting: Family Medicine

## 2015-02-01 ENCOUNTER — Ambulatory Visit (INDEPENDENT_AMBULATORY_CARE_PROVIDER_SITE_OTHER): Payer: Medicaid Other | Admitting: Family Medicine

## 2015-02-01 ENCOUNTER — Encounter: Payer: Medicaid Other | Admitting: Family Medicine

## 2015-02-01 VITALS — BP 128/80 | HR 114 | Temp 97.9°F | Ht 72.0 in | Wt 285.0 lb

## 2015-02-01 DIAGNOSIS — F341 Dysthymic disorder: Secondary | ICD-10-CM

## 2015-02-01 DIAGNOSIS — K580 Irritable bowel syndrome with diarrhea: Secondary | ICD-10-CM

## 2015-02-01 DIAGNOSIS — E119 Type 2 diabetes mellitus without complications: Secondary | ICD-10-CM

## 2015-02-01 DIAGNOSIS — R5382 Chronic fatigue, unspecified: Secondary | ICD-10-CM | POA: Diagnosis not present

## 2015-02-01 DIAGNOSIS — I10 Essential (primary) hypertension: Secondary | ICD-10-CM | POA: Diagnosis not present

## 2015-02-01 DIAGNOSIS — N926 Irregular menstruation, unspecified: Secondary | ICD-10-CM | POA: Diagnosis not present

## 2015-02-01 DIAGNOSIS — R5383 Other fatigue: Secondary | ICD-10-CM | POA: Insufficient documentation

## 2015-02-01 DIAGNOSIS — R1084 Generalized abdominal pain: Secondary | ICD-10-CM

## 2015-02-01 DIAGNOSIS — Z23 Encounter for immunization: Secondary | ICD-10-CM | POA: Diagnosis present

## 2015-02-01 LAB — CBC WITH DIFFERENTIAL/PLATELET
BASOS PCT: 0 % (ref 0–1)
Basophils Absolute: 0 10*3/uL (ref 0.0–0.1)
Eosinophils Absolute: 0.3 10*3/uL (ref 0.0–0.7)
Eosinophils Relative: 2 % (ref 0–5)
HCT: 45.1 % (ref 36.0–46.0)
HEMOGLOBIN: 14.7 g/dL (ref 12.0–15.0)
Lymphocytes Relative: 29 % (ref 12–46)
Lymphs Abs: 4.1 10*3/uL — ABNORMAL HIGH (ref 0.7–4.0)
MCH: 28 pg (ref 26.0–34.0)
MCHC: 32.6 g/dL (ref 30.0–36.0)
MCV: 85.9 fL (ref 78.0–100.0)
MONO ABS: 0.7 10*3/uL (ref 0.1–1.0)
MPV: 10.7 fL (ref 8.6–12.4)
Monocytes Relative: 5 % (ref 3–12)
NEUTROS ABS: 9 10*3/uL — AB (ref 1.7–7.7)
NEUTROS PCT: 64 % (ref 43–77)
PLATELETS: 355 10*3/uL (ref 150–400)
RBC: 5.25 MIL/uL — AB (ref 3.87–5.11)
RDW: 14.8 % (ref 11.5–15.5)
WBC: 14.1 10*3/uL — AB (ref 4.0–10.5)

## 2015-02-01 LAB — POCT GLYCOSYLATED HEMOGLOBIN (HGB A1C): HEMOGLOBIN A1C: 7.4

## 2015-02-01 LAB — POCT URINE PREGNANCY: PREG TEST UR: NEGATIVE

## 2015-02-01 NOTE — Patient Instructions (Signed)
It was great seeing you today.   I have order some labs today to check your fatigue. I will send you a letter with the results, or call you if we need to make any changes to your current therapies.  I have referred you to gastroenterology for further assessment of your abdominal pain   Please bring all your medications to every doctors visit  Sign up for My Chart to have easy access to your labs results, and communication with your Primary care physician.  Next Appointment  Please call to make an appointment with Dr Berkley Harvey in 3 months for diabetes   I look forward to talking with you again at our next visit. If you have any questions or concerns before then, please call the clinic at [phone].  Take Care,   Dr Phill Myron

## 2015-02-01 NOTE — Assessment & Plan Note (Signed)
Suspect chronic abdominal pain related to IBS and depression as well as chronic opioid therapy - Referred to GI ([facility]) for further evaluation and possible colonoscopy

## 2015-02-01 NOTE — Assessment & Plan Note (Signed)
Check A1c - Encourage optho follow up

## 2015-02-01 NOTE — Assessment & Plan Note (Signed)
Most likely multifactorial due to anxiety/depression, diabetes, chronic pain / chronic opioid therapy. Could be related to menopause - check upreg, A1c, TSH, CBC, CMP

## 2015-02-01 NOTE — Progress Notes (Signed)
  Patient name: [name] MRN [id number]  Date of birth: Nov 12, 1970  CC & HPI:  [name] is a 44 y.o. female presenting today for fatigue, abdominal pain and desire for pregnancy test.   Fatigue  - reports fatigue for years but has been worse over the past month; stays in bed 20 hours a day - Associated with decreased energy, interest, concentration - Has chronic depression, anxiety and depression - on paxil  - previously seen by psychology but not interested in see anyone right now - her daughter has similar symptoms and refuses to leave the house - on chronic narcotics for neuropathy - Reports LMP is 6 days late and has been having unprotected sex   Abdominal pain - chronic diffuse abdominal pain - previous diagnosed with IBS with diarrhea - denies fevers, chills,  - Reports vomiting 1 week ago but none since - chronic nausea  - previous referred to [facility] but no showed for multiple appointments so discharged from practice  ROS: See HPI   Medical & Surgical Hx:  Reviewed  Medications & Allergies: Reviewed  Social History: Reviewed:   Objective Findings:  Vitals: BP 128/80 mmHg  Pulse 114  Temp(Src) 97.9 F (36.6 C) (Oral)  Ht 6' (1.829 m)  Wt 285 lb (129.275 kg)  BMI 38.64 kg/m2  Gen: NAD; Obese CV: RRR w/o m/r/g, pulses +2 b/l Resp: CTAB w/ normal respiratory effort GI: No skin changes; BS + x 4 quads; Mild diffuse tenderness; NO masses, No CVA tenderness  Assessment & Plan:   Fatigue Most likely multifactorial due to anxiety/depression, diabetes, chronic pain / chronic opioid therapy. Could be related to menopause - check upreg, A1c, TSH, CBC, CMP   IBS Suspect chronic abdominal pain related to IBS and depression as well as chronic opioid therapy - Referred to GI ([facility]) for further evaluation and possible colonoscopy  DM2 (diabetes mellitus, type 2) Check A1c - Encourage optho follow up

## 2015-02-02 ENCOUNTER — Telehealth: Payer: Self-pay | Admitting: Family Medicine

## 2015-02-02 LAB — COMPREHENSIVE METABOLIC PANEL
ALK PHOS: 47 U/L (ref 33–115)
ALT: 20 U/L (ref 6–29)
AST: 16 U/L (ref 10–35)
Albumin: 4.3 g/dL (ref 3.6–5.1)
BUN: 11 mg/dL (ref 7–25)
CALCIUM: 10.2 mg/dL (ref 8.6–10.2)
CO2: 20 mmol/L (ref 20–31)
Chloride: 96 mmol/L — ABNORMAL LOW (ref 98–110)
Creat: 0.49 mg/dL — ABNORMAL LOW (ref 0.50–1.10)
Glucose, Bld: 268 mg/dL — ABNORMAL HIGH (ref 65–99)
POTASSIUM: 4.3 mmol/L (ref 3.5–5.3)
Sodium: 135 mmol/L (ref 135–146)
TOTAL PROTEIN: 7.6 g/dL (ref 6.1–8.1)
Total Bilirubin: 0.3 mg/dL (ref 0.2–1.2)

## 2015-02-02 LAB — TSH: TSH: 2.272 u[IU]/mL (ref 0.350–4.500)

## 2015-02-02 NOTE — Telephone Encounter (Signed)
Patient called back and results given for u-preg and A1C.  She states that we can just send her a mychart message when results from other labs are interpreted. Jazmin Hartsell,CMA

## 2015-02-02 NOTE — Telephone Encounter (Signed)
Will forward to MD. Jaquann Guarisco,CMA  

## 2015-02-02 NOTE — Telephone Encounter (Signed)
Would like results. Can leave message on answering machine

## 2015-02-03 ENCOUNTER — Ambulatory Visit: Payer: Medicaid Other | Admitting: Obstetrics

## 2015-02-03 NOTE — Progress Notes (Signed)
Urine drug screen for this encounter is consistent for prescribed medication 

## 2015-02-06 ENCOUNTER — Encounter: Payer: Self-pay | Admitting: Family Medicine

## 2015-02-08 ENCOUNTER — Encounter: Payer: Self-pay | Admitting: Family Medicine

## 2015-02-08 ENCOUNTER — Other Ambulatory Visit: Payer: Self-pay | Admitting: Family Medicine

## 2015-02-08 NOTE — Telephone Encounter (Signed)
Will forward to PCP for MyChart concern and to referral coordinator about GI referral. Traniya Prichett, CMA.

## 2015-02-10 ENCOUNTER — Other Ambulatory Visit: Payer: Self-pay | Admitting: Family Medicine

## 2015-02-10 ENCOUNTER — Telehealth: Payer: Self-pay | Admitting: Family Medicine

## 2015-02-10 ENCOUNTER — Telehealth: Payer: Self-pay | Admitting: *Deleted

## 2015-02-10 DIAGNOSIS — B373 Candidiasis of vulva and vagina: Secondary | ICD-10-CM

## 2015-02-10 DIAGNOSIS — B3731 Acute candidiasis of vulva and vagina: Secondary | ICD-10-CM

## 2015-02-10 MED ORDER — FLUCONAZOLE 150 MG PO TABS: 150.0000 mg | ORAL_TABLET | ORAL | Status: DC

## 2015-02-10 NOTE — Telephone Encounter (Signed)
Patient would like to receive correspondence from her recent "patient email" regarding a high WBC reading and her lipid panel has not been uploaded to [facility] as of yet. Please contact patient at the earliest convenience. Thank you, Fonda Kinder, ASA

## 2015-02-10 NOTE — Telephone Encounter (Signed)
Patient called and stated she has scheduled an appointment in January and she would like a yeast treatment for now. Patient is requesting Diflucan. OK for 1 refill per Dr Jodi Mourning

## 2015-02-10 NOTE — Telephone Encounter (Signed)
Will forward to PCP and MD covering for PCP for review of recent lab results. Eduar Kumpf, CMA.

## 2015-02-14 ENCOUNTER — Encounter: Payer: Self-pay | Admitting: Physical Medicine & Rehabilitation

## 2015-02-14 ENCOUNTER — Encounter: Payer: Medicaid Other | Attending: Physical Medicine & Rehabilitation

## 2015-02-14 ENCOUNTER — Ambulatory Visit (HOSPITAL_BASED_OUTPATIENT_CLINIC_OR_DEPARTMENT_OTHER): Payer: Medicaid Other | Admitting: Physical Medicine & Rehabilitation

## 2015-02-14 VITALS — BP 147/80 | HR 99

## 2015-02-14 DIAGNOSIS — M25561 Pain in right knee: Secondary | ICD-10-CM | POA: Insufficient documentation

## 2015-02-14 DIAGNOSIS — F341 Dysthymic disorder: Secondary | ICD-10-CM | POA: Diagnosis not present

## 2015-02-14 DIAGNOSIS — Z79899 Other long term (current) drug therapy: Secondary | ICD-10-CM | POA: Insufficient documentation

## 2015-02-14 DIAGNOSIS — G629 Polyneuropathy, unspecified: Secondary | ICD-10-CM | POA: Diagnosis not present

## 2015-02-14 DIAGNOSIS — Z5181 Encounter for therapeutic drug level monitoring: Secondary | ICD-10-CM | POA: Insufficient documentation

## 2015-02-14 DIAGNOSIS — M25562 Pain in left knee: Secondary | ICD-10-CM | POA: Diagnosis present

## 2015-02-14 DIAGNOSIS — G894 Chronic pain syndrome: Secondary | ICD-10-CM | POA: Diagnosis present

## 2015-02-14 MED ORDER — CLONAZEPAM 0.5 MG PO TABS: 1.5000 mg | ORAL_TABLET | Freq: Every day | ORAL | Status: DC

## 2015-02-14 MED ORDER — MORPHINE SULFATE 15 MG PO TABS: ORAL_TABLET | ORAL | Status: DC

## 2015-02-14 MED ORDER — MORPHINE SULFATE ER 30 MG PO TBCR: 30.0000 mg | EXTENDED_RELEASE_TABLET | Freq: Three times a day (TID) | ORAL | Status: DC

## 2015-02-14 NOTE — Telephone Encounter (Signed)
Spoke with patient about her questions.  1. Leukocytosis: I explained to her that this was a marker of sometimes of infection or stress reaction 2. Referral for counseling: Advised that she can either follow-up with Dr. Gwenlyn Saran or schedule a follow-up with Dr. Berkley Harvey and then see one of the PhD candidates in integrative care 3. Referral for IBS: A referral was made to gastroenterology on 11 8 and she has yet to hear from them  Rosemarie Ax, MD PGY-3, [facility] 02/14/2015, 3:30 PM

## 2015-02-14 NOTE — Progress Notes (Signed)
Subjective:    Patient ID: [name], female    DOB: 01/11/1971, 43 y.o.   MRN: [id number]  HPI 44 yo female, With chronic neuropathic pain bilateral lower extremities. She initially had minimal elevation of her blood sugars but has developed increasing problems with blood sugar management. She is on metformin managed by her primary physician. She has been gaining weight over time and probably increased by 10 pounds since my last visit 8 months ago. She's had depressed mood and has spoken to her primary care physician about this. There is apparently a psychologist in the family practice clinic that she can see. She has not received an appointment yet for this.  She had been trialed on Nucynta last time I saw her for neuropathic pain but this was not effective and she had side effect of headache. In addition the patient has some concerns about the Klonopin. She is currently taking 2 mg at night along with morphine 30 mg and Zanaflex 2 mg. We discussed the CDC recommendations of no benzodiazepines with opioids and also her total dose of opioids which exceeds 100 mg of morphine per day     Pain Inventory Average Pain 8 Pain Right Now 9 My pain is constant, sharp, burning, dull, stabbing, tingling and aching  In the last 24 hours, has pain interfered with the following? General activity 9 Relation with others 10 Enjoyment of life 10 What TIME of day is your pain at its worst? evening Sleep (in general) Fair  Pain is worse with: walking, bending, sitting, standing and some activites Pain improves with: NA Relief from Meds: 6  Mobility walk without assistance walk with assistance use a cane how many minutes can you walk? 5-10 ability to climb steps?  no do you drive?  no needs help with transfers Do you have any goals in this area?  yes  Function disabled: date disabled na I need assistance with the following:  bathing, household duties and shopping Do you have any  goals in this area?  yes  Neuro/Psych weakness numbness tingling trouble walking spasms dizziness depression anxiety  Prior Studies Any changes since last visit?  no  Physicians involved in your care Any changes since last visit?  no   Family History  Problem Relation Age of Onset  . Stroke Maternal Grandmother   . Colon cancer Maternal Grandmother   . Heart Problems Paternal Grandmother   . Dementia Paternal Grandmother   . Heart attack Paternal Grandfather    Social History   Social History  . Marital Status: Legally Separated    Spouse Name: N/A  . Number of Children: N/A  . Years of Education: N/A   Social History Main Topics  . Smoking status: Never Smoker   . Smokeless tobacco: Never Used  . Alcohol Use: None  . Drug Use: None  . Sexual Activity: Not Asked   Other Topics Concern  . None   Social History Narrative   Past Surgical History  Procedure Laterality Date  . Cholecystectomy    . Abdominoplasty     Past Medical History  Diagnosis Date  . 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 DEPRESSION/ANXIETY 08/08/2009  . IBS 02/02/2010  . Polyneuropathy ([place]) 12/04/2010   BP 147/80 mmHg  Pulse 99  SpO2 100%  Opioid Risk Score:   Fall Risk Score:  `1  Depression screen PHQ 2/9  Depression screen [facility] 2/9 02/14/2015 02/01/2015 12/17/2014  11/22/2014 07/01/2014 12/11/2012  Decreased Interest 2 0 3 3 2 3   Down, Depressed, Hopeless 3 0 3 3 2 3   PHQ - 2 Score 5 0 6 6 4 6   Altered sleeping 2 - - - 3 -  Tired, decreased energy 3 - - - 3 -  Change in appetite 1 - - - 2 -  Feeling bad or failure about yourself  3 - - - 3 -  Trouble concentrating 1 - - - 1 -  Moving slowly or fidgety/restless 0 - - - 0 -  Suicidal thoughts 1 - - - 1 -  PHQ-9 Score 16 - - - 17 -  Difficult doing work/chores Very difficult - - - Extremely dIfficult -     Review of Systems  Constitutional:  Positive for diaphoresis.  Gastrointestinal: Positive for nausea, vomiting, abdominal pain, diarrhea and constipation.  Endocrine:       High Blood Sugar  Musculoskeletal: Positive for gait problem.       Spasms  Neurological: Positive for dizziness and numbness.       Tingling  Psychiatric/Behavioral: The patient is nervous/anxious.        Depression  All other systems reviewed and are negative.      Objective:   Physical Exam  Constitutional: She appears well-developed.  Obese  HENT:  Head: Normocephalic and atraumatic.  Eyes: Conjunctivae and EOM are normal. Pupils are equal, round, and reactive to light.  Neck: Normal range of motion.  Neurological: She is alert.  4/5 strength bilateral ankle dorsiflexors 4 minus at the toe flexors and extensors 5/5 in the hip flexors knee extensors 5/5 in bilateral upper extremities Sensation is reduced to light touch in bilateral lower limbs Below the knees. She has absent pinprick sensation in the feet including the toes and medial malleolus she does have preserved sensation at the lateral malleolus.  Skin:  Foot inspection no evidence of breakdown. She does have calluses on the plantar aspect of her great toes bilaterally  Psychiatric: She has a normal mood and affect.  Nursing note and vitals reviewed.  Reduced proprioception bilateral great toes       Assessment & Plan:  1.  Painful diabetic neuropathy,She has fair relief from morphine. She has tried Lyrica as well as gabapentin. She has tried other opioids including methadone which caused excessive sedation. No significant improvement with Nucynta and also had headache side effect. At this point I do not have any other medications that she should be trialed on. Given that she is on 120 mg of morphine per day would not recommend increasing this. We'll continue the following regimen morphine extended release 30 mg 3 times a day and morphine immediate release 30 mg once a day. Continue  opioid monitoring program. This consists of regular clinic visits, examinations, urine drug screen, pill counts as well as use of [place] controlled substance reporting System.Last UDS 01/18/2015 was consistent  In regards to her Klonopin this is mainly for sleep and anxiety. Will reduce from 2 mg to 1.5 mg given the associated risks of cope prescribing both benzodiazepines and narcotic  Over half of the 25 min visit was spent counseling and coordinating care. analgesics.  2.  Depression plans to f/u with psych at the [facility] practice center, PHQ-9 still elevated at 16  currently on Paxil 40 mg a day

## 2015-02-16 ENCOUNTER — Other Ambulatory Visit: Payer: Self-pay | Admitting: Family Medicine

## 2015-02-16 NOTE — Telephone Encounter (Signed)
Refill request from pharmacy. Will forward to PCP for review. Ariv Penrod, CMA. 

## 2015-02-22 ENCOUNTER — Telehealth: Payer: Self-pay

## 2015-02-22 ENCOUNTER — Telehealth: Payer: Self-pay | Admitting: Psychology

## 2015-02-22 NOTE — Telephone Encounter (Signed)
Patient called and left a VM requesting an appointment.  Called her back and left a VM.

## 2015-02-22 NOTE — Telephone Encounter (Signed)
-----   Message from Dow Adolph sent at 02/21/2015  4:46 PM EST ----- Rachel Bo put a note in the Patient's referral that he forwarded the records to Dr. Havery Moros on 02-03-15 from [place] ----- Message -----    From: Margreta Journey, [place]: 02/21/2015   4:34 PM      To: Dow Adolph  Have you received records on this patient?   ----- Message -----    From: Dow Adolph    Sent: 02/21/2015   3:54 PM      To: Margreta Journey, CMA  Pt wants to know if Dr. Havery Moros has reviewed her records that were forwarded to him on 02-03-15  [phone]

## 2015-02-22 NOTE — Telephone Encounter (Signed)
Dr. Havery Moros. Have you received and reviewed pt's notes from [facility]?

## 2015-02-23 NOTE — Telephone Encounter (Signed)
I will check on this. No worries. If I need to request it again I will and I will let you know when I receive it.

## 2015-02-23 NOTE — Telephone Encounter (Signed)
When I get back in the office I will have to look at my desk. Can you check and see if you see it there? If not, we may have to put out another request. I am seeing an old note from their clinic but no endoscopy reports, which is what we may need. Thanks

## 2015-03-08 ENCOUNTER — Other Ambulatory Visit: Payer: Self-pay | Admitting: Registered Nurse

## 2015-03-08 ENCOUNTER — Other Ambulatory Visit: Payer: Self-pay | Admitting: Family Medicine

## 2015-03-08 ENCOUNTER — Other Ambulatory Visit: Payer: Self-pay | Admitting: Physical Medicine & Rehabilitation

## 2015-03-11 ENCOUNTER — Ambulatory Visit: Payer: Medicaid Other | Admitting: Family Medicine

## 2015-03-16 ENCOUNTER — Encounter: Payer: Self-pay | Admitting: Registered Nurse

## 2015-03-16 ENCOUNTER — Encounter: Payer: Medicaid Other | Attending: Physical Medicine & Rehabilitation | Admitting: Registered Nurse

## 2015-03-16 VITALS — BP 142/52 | HR 97

## 2015-03-16 DIAGNOSIS — E1140 Type 2 diabetes mellitus with diabetic neuropathy, unspecified: Secondary | ICD-10-CM | POA: Diagnosis not present

## 2015-03-16 DIAGNOSIS — F341 Dysthymic disorder: Secondary | ICD-10-CM

## 2015-03-16 DIAGNOSIS — M25561 Pain in right knee: Secondary | ICD-10-CM | POA: Insufficient documentation

## 2015-03-16 DIAGNOSIS — Z5181 Encounter for therapeutic drug level monitoring: Secondary | ICD-10-CM

## 2015-03-16 DIAGNOSIS — G894 Chronic pain syndrome: Secondary | ICD-10-CM | POA: Diagnosis present

## 2015-03-16 DIAGNOSIS — G8929 Other chronic pain: Secondary | ICD-10-CM | POA: Diagnosis not present

## 2015-03-16 DIAGNOSIS — Z79899 Other long term (current) drug therapy: Secondary | ICD-10-CM | POA: Insufficient documentation

## 2015-03-16 DIAGNOSIS — M25562 Pain in left knee: Secondary | ICD-10-CM | POA: Diagnosis present

## 2015-03-16 MED ORDER — MORPHINE SULFATE ER 30 MG PO TBCR: 30.0000 mg | EXTENDED_RELEASE_TABLET | Freq: Three times a day (TID) | ORAL | Status: DC

## 2015-03-16 MED ORDER — MORPHINE SULFATE 15 MG PO TABS: ORAL_TABLET | ORAL | Status: DC

## 2015-03-16 NOTE — Progress Notes (Signed)
Subjective:    Patient ID: [name], female    DOB: 07/12/1970, 44 y.o.   MRN: [id number]  HPI: [name] is a 44 year old female who returns for follow up for chronic pain and medication refill. She says her pain is located in her bilateral hands and bilateral feet.She rates her pain 8. Her current exercise regime is walking. Also states occassionally she has been having chest pain, she denies chest pain or SOB at this time.No distress noted. Instructed to call 911 if this occurs again and to speak with her PCP she verbalizes understanding. She will be starting counseling in January with Dr. Gwenlyn Saran . Arrived hypertensive she is compliant with her anti-hypertensive's.  Pain Inventory Average Pain 8 Pain Right Now 8 My pain is constant, sharp, burning, dull, stabbing, tingling and aching  In the last 24 hours, has pain interfered with the following? General activity 9 Relation with others 9 Enjoyment of life 9 What TIME of day is your pain at its worst? evening Sleep (in general) Poor  Pain is worse with: walking, bending, standing and some activites Pain improves with: NA Relief from Meds: 9  Mobility walk without assistance walk with assistance use a cane how many minutes can you walk? 5 ability to climb steps?  no do you drive?  no Do you have any goals in this area?  yes  Function disabled: date disabled NA I need assistance with the following:  bathing, household duties and shopping Do you have any goals in this area?  yes  Neuro/Psych weakness numbness tremor tingling trouble walking spasms dizziness depression anxiety suicidal thoughts  Prior Studies Any changes since last visit?  no  Physicians involved in your care Any changes since last visit?  no   Family History  Problem Relation Age of Onset  . Stroke Maternal Grandmother   . Colon cancer Maternal Grandmother   . Heart Problems Paternal Grandmother   . Dementia  Paternal Grandmother   . Heart attack Paternal Grandfather    Social History   Social History  . Marital Status: Legally Separated    Spouse Name: N/A  . Number of Children: N/A  . Years of Education: N/A   Social History Main Topics  . Smoking status: Never Smoker   . Smokeless tobacco: Never Used  . Alcohol Use: None  . Drug Use: None  . Sexual Activity: Not Asked   Other Topics Concern  . None   Social History Narrative   Past Surgical History  Procedure Laterality Date  . Cholecystectomy    . Abdominoplasty     Past Medical History  Diagnosis Date  . 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 DEPRESSION/ANXIETY 08/08/2009  . IBS 02/02/2010  . Polyneuropathy ([place]) 12/04/2010   BP 132/59 mmHg  Pulse 101  SpO2 98%  Opioid Risk Score:   Fall Risk Score:  `1  Depression screen PHQ 2/9  Depression screen [facility] 2/9 02/14/2015 02/01/2015 12/17/2014 11/22/2014 07/01/2014 12/11/2012  Decreased Interest 2 0 3 3 2 3   Down, Depressed, Hopeless 3 0 3 3 2 3   PHQ - 2 Score 5 0 6 6 4 6   Altered sleeping 2 - - - 3 -  Tired, decreased energy 3 - - - 3 -  Change in appetite 1 - - - 2 -  Feeling bad or failure about yourself  3 - - - 3 -  Trouble  concentrating 1 - - - 1 -  Moving slowly or fidgety/restless 0 - - - 0 -  Suicidal thoughts 1 - - - 1 -  PHQ-9 Score 16 - - - 17 -  Difficult doing work/chores Very difficult - - - Extremely dIfficult -     Review of Systems  Constitutional: Positive for diaphoresis.  Gastrointestinal: Positive for nausea, abdominal pain, diarrhea and constipation.  Musculoskeletal: Positive for gait problem.       Spasms  Neurological: Positive for dizziness, tremors, weakness and numbness.       TIngling  Psychiatric/Behavioral: Positive for confusion. The patient is nervous/anxious.        Depression  All other systems reviewed and are negative.      Objective:    Physical Exam  Constitutional: She is oriented to person, place, and time. She appears well-developed and well-nourished.  HENT:  Head: Normocephalic and atraumatic.  Neck: Normal range of motion. Neck supple.  Cardiovascular: Normal rate and regular rhythm.   Pulmonary/Chest: Effort normal and breath sounds normal.  Musculoskeletal:  Normal Muscle Bulk and Muscle testing Reveals: Upper Extremities: Full ROM and Muscle Strength 5/5 Lower Extremities: Full ROM and Muscle Strength 5/5 Arises from chair with ease Narrow Based Gait  Neurological: She is alert and oriented to person, place, and time.  Skin: Skin is warm and dry.  Psychiatric: She has a normal mood and affect.  Nursing note and vitals reviewed.         Assessment & Plan:  1. Severe peripheral polyneuropathy of undetermined etiology.:  Refilled: MS Contin 30 mg one tablet three times a day#90 and MSIR 15 mg one- two tablet's daily as needed # 60. 2. Bilateral progressive upper extremity hand pain. Continue with current medication regime. Tight Glucose Control. Adhere to healthy diet regime. 3. Muscle Spasms: Continue Zanaflex.    20 minutes of face to face patient care time spent during this visit. All questions were encouraged and answered.   F/U in 1 month

## 2015-03-18 ENCOUNTER — Other Ambulatory Visit: Payer: Self-pay | Admitting: Family Medicine

## 2015-03-22 NOTE — Telephone Encounter (Signed)
2nd request.  Christia Coaxum L, RN  

## 2015-04-04 ENCOUNTER — Ambulatory Visit: Payer: Medicaid Other | Admitting: Obstetrics

## 2015-04-07 ENCOUNTER — Telehealth: Payer: Self-pay | Admitting: Gastroenterology

## 2015-04-07 NOTE — Telephone Encounter (Signed)
Dr. Havery Moros has approved records - Needs OV first.  Left message for call back.

## 2015-04-17 ENCOUNTER — Other Ambulatory Visit: Payer: Self-pay | Admitting: Physical Medicine & Rehabilitation

## 2015-04-18 ENCOUNTER — Ambulatory Visit: Payer: Medicaid Other | Admitting: Obstetrics

## 2015-04-19 ENCOUNTER — Other Ambulatory Visit: Payer: Self-pay | Admitting: Physical Medicine & Rehabilitation

## 2015-04-19 NOTE — Telephone Encounter (Signed)
rec'd electronic request for klonopin refill.  Read Dr. Letta Pate last note. Stated that patient had concerns over taking Klonopin. Dr. Letta Pate, equally, has concerns about taking benzodiazepines along with opioids.  Patient has an appointment with Zella Ball tomorrow.   Please advise

## 2015-04-20 ENCOUNTER — Encounter: Payer: Medicaid Other | Attending: Physical Medicine & Rehabilitation | Admitting: Registered Nurse

## 2015-04-20 ENCOUNTER — Encounter: Payer: Self-pay | Admitting: Registered Nurse

## 2015-04-20 VITALS — BP 146/54 | HR 101 | Resp 16

## 2015-04-20 DIAGNOSIS — G894 Chronic pain syndrome: Secondary | ICD-10-CM | POA: Diagnosis not present

## 2015-04-20 DIAGNOSIS — M25562 Pain in left knee: Secondary | ICD-10-CM | POA: Insufficient documentation

## 2015-04-20 DIAGNOSIS — G629 Polyneuropathy, unspecified: Secondary | ICD-10-CM

## 2015-04-20 DIAGNOSIS — Z79899 Other long term (current) drug therapy: Secondary | ICD-10-CM | POA: Insufficient documentation

## 2015-04-20 DIAGNOSIS — F341 Dysthymic disorder: Secondary | ICD-10-CM

## 2015-04-20 DIAGNOSIS — M25561 Pain in right knee: Secondary | ICD-10-CM | POA: Diagnosis present

## 2015-04-20 DIAGNOSIS — Z5181 Encounter for therapeutic drug level monitoring: Secondary | ICD-10-CM | POA: Diagnosis present

## 2015-04-20 MED ORDER — MORPHINE SULFATE ER 30 MG PO TBCR: 30.0000 mg | EXTENDED_RELEASE_TABLET | Freq: Three times a day (TID) | ORAL | Status: DC

## 2015-04-20 MED ORDER — MORPHINE SULFATE 15 MG PO TABS: ORAL_TABLET | ORAL | Status: DC

## 2015-04-20 MED ORDER — CLONAZEPAM 0.5 MG PO TABS: 1.5000 mg | ORAL_TABLET | Freq: Every day | ORAL | Status: DC

## 2015-04-20 NOTE — Progress Notes (Signed)
Subjective:    Patient ID: [name], female    DOB: 22-Jun-1970, 45 y.o.   MRN: [id number]  HPI: [name] is a 45 year old female who returns for follow up for chronic pain and medication refill. She says her pain is located in her bilateral hands, bilateral lower extremities and bilateral feet.She rates her pain 8. Her current exercise regime is walking.  Pain Inventory Average Pain 8 Pain Right Now 8 My pain is constant, sharp, burning, dull, stabbing, tingling and aching  In the last 24 hours, has pain interfered with the following? General activity 9 Relation with others 9 Enjoyment of life 9 What TIME of day is your pain at its worst? morning Sleep (in general) Poor  Pain is worse with: walking, bending, sitting, standing and some activites Pain improves with: medication Relief from Meds: 7  Mobility walk without assistance walk with assistance use a cane how many minutes can you walk? 5 ability to climb steps?  yes do you drive?  yes Do you have any goals in this area?  yes  Function disabled: date disabled . I need assistance with the following:  bathing, meal prep, household duties and shopping Do you have any goals in this area?  yes  Neuro/Psych weakness numbness tremor tingling trouble walking spasms dizziness depression anxiety  Prior Studies Any changes since last visit?  no  Physicians involved in your care Any changes since last visit?  no   Family History  Problem Relation Age of Onset  . Stroke Maternal Grandmother   . Colon cancer Maternal Grandmother   . Heart Problems Paternal Grandmother   . Dementia Paternal Grandmother   . Heart attack Paternal Grandfather    Social History   Social History  . Marital Status: Legally Separated    Spouse Name: N/A  . Number of Children: N/A  . Years of Education: N/A   Social History Main Topics  . Smoking status: Never Smoker   . Smokeless tobacco: Never Used  .  Alcohol Use: None  . Drug Use: None  . Sexual Activity: Not Asked   Other Topics Concern  . None   Social History Narrative   Past Surgical History  Procedure Laterality Date  . Cholecystectomy    . Abdominoplasty     Past Medical History  Diagnosis Date  . 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 DEPRESSION/ANXIETY 08/08/2009  . IBS 02/02/2010  . Polyneuropathy ([place]) 12/04/2010   BP 146/54 mmHg  Pulse 101  Resp 16  SpO2 96%  Opioid Risk Score:   Fall Risk Score:  `1  Depression screen PHQ 2/9  Depression screen [facility] 2/9 02/14/2015 02/01/2015 12/17/2014 11/22/2014 07/01/2014 12/11/2012  Decreased Interest 2 0 3 3 2 3   Down, Depressed, Hopeless 3 0 3 3 2 3   PHQ - 2 Score 5 0 6 6 4 6   Altered sleeping 2 - - - 3 -  Tired, decreased energy 3 - - - 3 -  Change in appetite 1 - - - 2 -  Feeling bad or failure about yourself  3 - - - 3 -  Trouble concentrating 1 - - - 1 -  Moving slowly or fidgety/restless 0 - - - 0 -  Suicidal thoughts 1 - - - 1 -  PHQ-9 Score 16 - - - 17 -  Difficult doing work/chores Very difficult - - - Extremely dIfficult -  Review of Systems  Constitutional: Positive for chills and diaphoresis.  Gastrointestinal: Positive for nausea, abdominal pain, diarrhea and constipation.  Endocrine:       High blood sugar  All other systems reviewed and are negative.      Objective:   Physical Exam  Constitutional: She is oriented to person, place, and time. She appears well-developed and well-nourished.  HENT:  Head: Normocephalic and atraumatic.  Neck: Normal range of motion. Neck supple.  Cardiovascular: Normal rate and regular rhythm.   Pulmonary/Chest: Effort normal and breath sounds normal.  Musculoskeletal:  Normal Muscle Bulk and Muscle Testing Reveals: Upper Extremities: Full ROM and Muscle Strength 5/5 Lumbar Paraspinal Tenderness: L-3- L-5 Lower Extremities: Full  ROM and Muscle Strength 5/5 Arises from chair with ease Narrow Based Gait  Neurological: She is alert and oriented to person, place, and time.  Skin: Skin is warm and dry.  Psychiatric: She has a normal mood and affect.  Nursing note and vitals reviewed.         Assessment & Plan:  1. Severe peripheral polyneuropathy of undetermined etiology.:  Refilled: MS Contin 30 mg one tablet three times a day#90 and MSIR 15 mg one- two tablet's daily as needed # 60. 2. Bilateral progressive upper extremity hand pain. Continue with current medication regime. Tight Glucose Control. Adhere to healthy diet regime. 3. Muscle Spasms: Continue Zanaflex.  4. Depression/Anxiety: Continue Klonopin. Waiting on Psychiatry appointment  20 minutes of face to face patient care time spent during this visit. All questions were encouraged and answered.   F/U in 1 month

## 2015-04-22 ENCOUNTER — Ambulatory Visit: Payer: Medicaid Other | Admitting: Family Medicine

## 2015-04-27 ENCOUNTER — Other Ambulatory Visit: Payer: Self-pay | Admitting: Family Medicine

## 2015-05-05 ENCOUNTER — Ambulatory Visit: Payer: Medicaid Other | Admitting: Obstetrics

## 2015-05-24 ENCOUNTER — Encounter: Payer: Self-pay | Admitting: Registered Nurse

## 2015-05-24 ENCOUNTER — Encounter: Payer: Medicaid Other | Attending: Physical Medicine & Rehabilitation | Admitting: Registered Nurse

## 2015-05-24 VITALS — BP 151/85 | HR 99

## 2015-05-24 DIAGNOSIS — M25561 Pain in right knee: Secondary | ICD-10-CM | POA: Diagnosis present

## 2015-05-24 DIAGNOSIS — M25562 Pain in left knee: Secondary | ICD-10-CM | POA: Insufficient documentation

## 2015-05-24 DIAGNOSIS — G629 Polyneuropathy, unspecified: Secondary | ICD-10-CM | POA: Diagnosis not present

## 2015-05-24 DIAGNOSIS — Z79899 Other long term (current) drug therapy: Secondary | ICD-10-CM | POA: Insufficient documentation

## 2015-05-24 DIAGNOSIS — G894 Chronic pain syndrome: Secondary | ICD-10-CM | POA: Insufficient documentation

## 2015-05-24 DIAGNOSIS — R270 Ataxia, unspecified: Secondary | ICD-10-CM

## 2015-05-24 DIAGNOSIS — R278 Other lack of coordination: Secondary | ICD-10-CM

## 2015-05-24 DIAGNOSIS — Z5181 Encounter for therapeutic drug level monitoring: Secondary | ICD-10-CM | POA: Diagnosis present

## 2015-05-24 DIAGNOSIS — F341 Dysthymic disorder: Secondary | ICD-10-CM

## 2015-05-24 MED ORDER — MORPHINE SULFATE 15 MG PO TABS: ORAL_TABLET | ORAL | Status: DC

## 2015-05-24 MED ORDER — MORPHINE SULFATE ER 30 MG PO TBCR: 30.0000 mg | EXTENDED_RELEASE_TABLET | Freq: Three times a day (TID) | ORAL | Status: DC

## 2015-05-24 NOTE — Progress Notes (Signed)
Subjective:    Patient ID: [name], female    DOB: 03/25/1971, 45 y.o.   MRN: [id number]  HPI: [name] is a 45 year old female who returns for follow up for chronic pain and medication refill. She states her pain is located in her bilateral hands, bilateral lower extremities and bilateral feet.She rates her pain 10. Her current exercise regime is walking. Also states her pain has intensified and her analgesics wears off in a few hours. She has been encouraged to increase her outdoor activity and HEP. She verbalizes understanding. Also states she has noticed increase loss of coordination, she states she has fallen in the past no recent falls at this time will place a referral to neurology. She verbalizes understanding.  Pain Inventory Average Pain 10 Pain Right Now 10 My pain is constant, sharp, burning, dull, stabbing, tingling and aching  In the last 24 hours, has pain interfered with the following? General activity 10 Relation with others 10 Enjoyment of life 10 What TIME of day is your pain at its worst? All times Sleep (in general) Poor  Pain is worse with: walking, bending, sitting, inactivity, standing, unsure, some activites and . Pain improves with: medication Relief from Meds: 3  Mobility Do you have any goals in this area?  yes  Function disabled: date disabled . I need assistance with the following:  bathing, meal prep, household duties and shopping Do you have any goals in this area?  yes  Neuro/Psych weakness numbness tremor tingling trouble walking spasms dizziness depression  Prior Studies Any changes since last visit?  no  Physicians involved in your care Any changes since last visit?  no   Family History  Problem Relation Age of Onset  . Stroke Maternal Grandmother   . Colon cancer Maternal Grandmother   . Heart Problems Paternal Grandmother   . Dementia Paternal Grandmother   . Heart attack Paternal Grandfather     Social History   Social History  . Marital Status: Legally Separated    Spouse Name: N/A  . Number of Children: N/A  . Years of Education: N/A   Social History Main Topics  . Smoking status: Never Smoker   . Smokeless tobacco: Never Used  . Alcohol Use: None  . Drug Use: None  . Sexual Activity: Not Asked   Other Topics Concern  . None   Social History Narrative   Past Surgical History  Procedure Laterality Date  . Cholecystectomy    . Abdominoplasty     Past Medical History  Diagnosis Date  . 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 DEPRESSION/ANXIETY 08/08/2009  . IBS 02/02/2010  . Polyneuropathy ([place]) 12/04/2010   There were no vitals taken for this visit.  Opioid Risk Score:   Fall Risk Score:  `1  Depression screen PHQ 2/9  Depression screen [facility] 2/9 02/14/2015 02/01/2015 12/17/2014 11/22/2014 07/01/2014 12/11/2012  Decreased Interest 2 0 3 3 2 3   Down, Depressed, Hopeless 3 0 3 3 2 3   PHQ - 2 Score 5 0 6 6 4 6   Altered sleeping 2 - - - 3 -  Tired, decreased energy 3 - - - 3 -  Change in appetite 1 - - - 2 -  Feeling bad or failure about yourself  3 - - - 3 -  Trouble concentrating 1 - - - 1 -  Moving slowly or fidgety/restless 0 - - -  0 -  Suicidal thoughts 1 - - - 1 -  PHQ-9 Score 16 - - - 17 -  Difficult doing work/chores Very difficult - - - Extremely dIfficult -     Review of Systems  All other systems reviewed and are negative.      Objective:   Physical Exam  Constitutional: She is oriented to person, place, and time. She appears well-developed and well-nourished.  HENT:  Head: Normocephalic and atraumatic.  Neck: Normal range of motion. Neck supple.  Cardiovascular: Normal rate and regular rhythm.   Pulmonary/Chest: Effort normal and breath sounds normal.  Musculoskeletal:  Normal Muscle Bulk and Muscle Testing Reveals: Upper Extremities: Full ROM and Muscle  Strength 5/5 Lower Extremities: Full ROM and Muscle Strength 5/5 Arises from chair with ease Narrow Based Gait  Neurological: She is alert and oriented to person, place, and time.  Skin: Skin is warm and dry.  Psychiatric: She has a normal mood and affect.  Nursing note and vitals reviewed.         Assessment & Plan:  1. Severe peripheral polyneuropathy of undetermined etiology.:  Refilled: MS Contin 30 mg one tablet three times a day#90 and MSIR 15 mg one- two tablet's daily as needed # 60. 2. Bilateral progressive upper extremity hand pain. Continue with current medication regime. Tight Glucose Control. Adhere to healthy diet regime. 3. Muscle Spasms: Continue Zanaflex.  4. Depression/Anxiety: Continue Klonopin. Waiting on Psychiatry appointment 5. Loss of Coordination: Referral Neurology  20 minutes of face to face patient care time spent during this visit. All questions were encouraged and answered.   F/U in 1 month

## 2015-05-26 ENCOUNTER — Telehealth: Payer: Self-pay | Admitting: Family Medicine

## 2015-05-26 NOTE — Telephone Encounter (Signed)
Need referrals for dermatology at [facility] if possible,orthopedic and sleep study with Elvina Sidle.  Need to have provider give order to have study from home.  Also need orthopedic referral.

## 2015-05-26 NOTE — Telephone Encounter (Signed)
Please call and have her make an appointment for evaluation.  Referrals can be done if needed at that appointment

## 2015-05-26 NOTE — Telephone Encounter (Signed)
Spoke with patient and she scheduled an appt for 06-13-15. Jazmin Hartsell,CMA

## 2015-05-27 ENCOUNTER — Ambulatory Visit: Payer: Medicaid Other | Admitting: Certified Nurse Midwife

## 2015-05-28 ENCOUNTER — Other Ambulatory Visit: Payer: Self-pay | Admitting: Family Medicine

## 2015-05-28 LAB — 6-ACETYLMORPHINE,TOXASSURE ADD
6-ACETYLMORPHINE: NEGATIVE
6-ACETYLMORPHINE: NOT DETECTED ng/mg{creat}

## 2015-05-28 LAB — TOXASSURE SELECT,+ANTIDEPR,UR: PDF: 0

## 2015-06-01 ENCOUNTER — Ambulatory Visit: Payer: Medicaid Other | Admitting: Certified Nurse Midwife

## 2015-06-06 ENCOUNTER — Ambulatory Visit: Payer: Medicaid Other | Admitting: Gastroenterology

## 2015-06-06 ENCOUNTER — Encounter (HOSPITAL_COMMUNITY): Payer: Self-pay | Admitting: Family Medicine

## 2015-06-06 ENCOUNTER — Emergency Department (HOSPITAL_COMMUNITY)
Admission: EM | Admit: 2015-06-06 | Discharge: 2015-06-06 | Disposition: A | Discharge status: Home / Self Care | Payer: Medicaid Other | Attending: Emergency Medicine | Admitting: Emergency Medicine

## 2015-06-06 DIAGNOSIS — E1149 Type 2 diabetes mellitus with other diabetic neurological complication: Secondary | ICD-10-CM | POA: Insufficient documentation

## 2015-06-06 DIAGNOSIS — G8929 Other chronic pain: Secondary | ICD-10-CM | POA: Diagnosis not present

## 2015-06-06 DIAGNOSIS — R109 Unspecified abdominal pain: Secondary | ICD-10-CM | POA: Diagnosis present

## 2015-06-06 DIAGNOSIS — R350 Frequency of micturition: Secondary | ICD-10-CM | POA: Diagnosis not present

## 2015-06-06 DIAGNOSIS — Z8669 Personal history of other diseases of the nervous system and sense organs: Secondary | ICD-10-CM | POA: Insufficient documentation

## 2015-06-06 DIAGNOSIS — Z79899 Other long term (current) drug therapy: Secondary | ICD-10-CM | POA: Insufficient documentation

## 2015-06-06 DIAGNOSIS — M549 Dorsalgia, unspecified: Secondary | ICD-10-CM | POA: Diagnosis not present

## 2015-06-06 DIAGNOSIS — Z9049 Acquired absence of other specified parts of digestive tract: Secondary | ICD-10-CM | POA: Diagnosis not present

## 2015-06-06 DIAGNOSIS — Z8719 Personal history of other diseases of the digestive system: Secondary | ICD-10-CM | POA: Diagnosis not present

## 2015-06-06 DIAGNOSIS — Z7984 Long term (current) use of oral hypoglycemic drugs: Secondary | ICD-10-CM | POA: Insufficient documentation

## 2015-06-06 LAB — COMPREHENSIVE METABOLIC PANEL
ALK PHOS: 42 U/L (ref 38–126)
ALT: 34 U/L (ref 14–54)
ANION GAP: 17 — AB (ref 5–15)
AST: 49 U/L — ABNORMAL HIGH (ref 15–41)
Albumin: 3.6 g/dL (ref 3.5–5.0)
BILIRUBIN TOTAL: 0.4 mg/dL (ref 0.3–1.2)
BUN: 9 mg/dL (ref 6–20)
CALCIUM: 10 mg/dL (ref 8.9–10.3)
CO2: 23 mmol/L (ref 22–32)
CREATININE: 0.49 mg/dL (ref 0.44–1.00)
Chloride: 102 mmol/L (ref 101–111)
Glucose, Bld: 150 mg/dL — ABNORMAL HIGH (ref 65–99)
Potassium: 4.1 mmol/L (ref 3.5–5.1)
SODIUM: 142 mmol/L (ref 135–145)
TOTAL PROTEIN: 7 g/dL (ref 6.5–8.1)

## 2015-06-06 LAB — URINALYSIS, ROUTINE W REFLEX MICROSCOPIC
BILIRUBIN URINE: NEGATIVE
Glucose, UA: NEGATIVE mg/dL
Hgb urine dipstick: NEGATIVE
Ketones, ur: 15 mg/dL — AB
Leukocytes, UA: NEGATIVE
NITRITE: NEGATIVE
PROTEIN: 100 mg/dL — AB
SPECIFIC GRAVITY, URINE: 1.025 (ref 1.005–1.030)
pH: 5 (ref 5.0–8.0)

## 2015-06-06 LAB — CBC
HCT: 43 % (ref 36.0–46.0)
HEMOGLOBIN: 14 g/dL (ref 12.0–15.0)
MCH: 27.8 pg (ref 26.0–34.0)
MCHC: 32.6 g/dL (ref 30.0–36.0)
MCV: 85.3 fL (ref 78.0–100.0)
PLATELETS: 299 10*3/uL (ref 150–400)
RBC: 5.04 MIL/uL (ref 3.87–5.11)
RDW: 14.4 % (ref 11.5–15.5)
WBC: 9.9 10*3/uL (ref 4.0–10.5)

## 2015-06-06 LAB — URINE MICROSCOPIC-ADD ON: RBC / HPF: NONE SEEN RBC/hpf (ref 0–5)

## 2015-06-06 LAB — LIPASE, BLOOD: Lipase: 43 U/L (ref 11–51)

## 2015-06-06 MED ORDER — KETOROLAC TROMETHAMINE 15 MG/ML IJ SOLN: 15.0000 mg | Freq: Once | INTRAMUSCULAR | Status: DC

## 2015-06-06 MED ORDER — KETOROLAC TROMETHAMINE 30 MG/ML IJ SOLN
30.0000 mg | Freq: Once | INTRAMUSCULAR | Status: AC
  Administered 2015-06-06: 30 mg via INTRAMUSCULAR
  Filled 2015-06-06: qty 1

## 2015-06-06 NOTE — ED Notes (Signed)
Pt here for flank pain and frequent urination for a few days. sts hx of stones.

## 2015-06-06 NOTE — ED Notes (Signed)
Explained to patient delay, patient reports being ready to go now. Passed on to provide, he acknowledges.

## 2015-06-06 NOTE — ED Notes (Signed)
Dr. Ashok Cordia at the bedside.

## 2015-06-06 NOTE — Care Management (Signed)
Patient presented to [facility] ED with back pain. ED CM noted patient to be active at the [facility] Dr. Berkley Harvey PCP last seen, 05/26/15 and has appt 3/20 at 3p.  She is also active with [facility] for chronic pain last seen 05/24/15. Patient is covered by Medicaid. ED CM met with patient, confirmed information.  Patient presented to [facility] ED with back pain, ED evaluation is pending. Patient reminded of follow up with [facility] 3/20 verbalized understanding. Updated ED Resident Margaretann Loveless MD.

## 2015-06-06 NOTE — Discharge Instructions (Signed)
Flank Pain °Flank pain is pain in your side. The flank is the area of your side between your upper belly (abdomen) and your back. Pain in this area can be caused by many different things. °HOME CARE °Home care and treatment will depend on the cause of your pain. °· Rest as told by your doctor. °· Drink enough fluids to keep your pee (urine) clear or pale yellow.   °· Only take medicine as told by your doctor. °· Tell your doctor about any changes in your pain. °· Follow up with your doctor. °GET HELP RIGHT AWAY IF:  °· Your pain does not get better with medicine.   °· You have new symptoms or your symptoms get worse. °· Your pain gets worse.   °· You have belly (abdominal) pain.   °· You are short of breath.   °· You always feel sick to your stomach (nauseous).   °· You keep throwing up (vomiting).   °· You have puffiness (swelling) in your belly.   °· You feel light-headed or you pass out (faint).   °· You have blood in your pee. °· You have a fever or lasting symptoms for more than 2-3 days. °· You have a fever and your symptoms suddenly get worse. °MAKE SURE YOU:  °· Understand these instructions. °· Will watch your condition. °· Will get help right away if you are not doing well or get worse. °  °This information is not intended to replace advice given to you by your health care provider. Make sure you discuss any questions you have with your health care provider. °  °Document Released: 12/20/2007 Document Revised: 04/02/2014 Document Reviewed: 10/25/2011 °Elsevier Interactive Patient Education ©2016 [employer] ° °

## 2015-06-06 NOTE — ED Provider Notes (Signed)
CSN: [id number]     Arrival date & time 06/06/15  1224 History   First MD Initiated Contact with Patient 06/06/15 1828     Chief Complaint  Patient presents with  . Flank Pain     (Consider location/radiation/quality/duration/timing/severity/associated sxs/prior Treatment) Patient is a 45 y.o. female presenting with flank pain. The history is provided by the patient.  Flank Pain This is a new problem. Episode onset: 2 days ago. The problem occurs constantly. The problem has been gradually worsening. Associated symptoms include abdominal pain. Pertinent negatives include no arthralgias, chest pain, chills, coughing, diaphoresis, fatigue, fever, headaches, myalgias, nausea, rash, sore throat, vomiting or weakness. Urinary symptoms:  frequency but no dysuria. Exacerbated by: movement, palpation. She has tried nothing for the symptoms.    Past Medical History  Diagnosis Date  . 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 DEPRESSION/ANXIETY 08/08/2009  . IBS 02/02/2010  . Polyneuropathy ([place]) 12/04/2010   Past Surgical History  Procedure Laterality Date  . Cholecystectomy    . Abdominoplasty     Family History  Problem Relation Age of Onset  . Stroke Maternal Grandmother   . Colon cancer Maternal Grandmother   . Heart Problems Paternal Grandmother   . Dementia Paternal Grandmother   . Heart attack Paternal Grandfather    Social History  Substance Use Topics  . Smoking status: Never Smoker   . Smokeless tobacco: Never Used  . Alcohol Use: None   OB History    No data available     Review of Systems  Constitutional: Negative for fever, chills, diaphoresis, activity change, appetite change and fatigue.  HENT: Negative for facial swelling, rhinorrhea, sore throat, trouble swallowing and voice change.   Eyes: Negative for photophobia, pain and visual disturbance.  Respiratory: Negative for cough,  shortness of breath, wheezing and stridor.   Cardiovascular: Negative for chest pain, palpitations and leg swelling.  Gastrointestinal: Positive for abdominal pain. Negative for nausea, vomiting, constipation and anal bleeding.  Endocrine: Negative.   Genitourinary: Positive for frequency and flank pain. Negative for dysuria, urgency, decreased urine volume, vaginal bleeding, vaginal discharge, vaginal pain and pelvic pain.  Musculoskeletal: Positive for back pain. Negative for myalgias and arthralgias.  Skin: Negative.  Negative for rash.  Allergic/Immunologic: Negative.   Neurological: Negative for dizziness, tremors, syncope, weakness and headaches.  Psychiatric/Behavioral: Negative for suicidal ideas, sleep disturbance and self-injury.  All other systems reviewed and are negative.     Allergies  Codeine; Nucynta; and Vancomycin  Home Medications   Prior to Admission medications   Medication Sig Start Date End Date Taking? Authorizing Provider  aspirin 81 MG tablet Take 81 mg by mouth daily.   Yes Historical Provider, MD  b complex vitamins capsule Take 1 capsule by mouth daily.   Yes Historical Provider, MD  clonazePAM (KLONOPIN) 0.5 MG tablet Take 3 tablets (1.5 mg total) by mouth at bedtime. 04/20/15  Yes Bayard Hugger, NP  enalapril (VASOTEC) 20 MG tablet TAKE 1 TABLET (20 MG TOTAL) BY MOUTH DAILY. 06/22/14  Yes Olam Idler, MD  glipiZIDE (GLUCOTROL) 10 MG tablet TAKE 1 TABLET BY MOUTH TWICE DAILY WITH MEALS 02/14/15  Yes Olam Idler, MD  metFORMIN (GLUCOPHAGE) 1000 MG tablet TAKE 1 TABLET BY MOUTH TWICE DAILY WITH MEALS 03/09/15  Yes Olam Idler, MD  morphine (MS CONTIN) 30 MG 12 hr tablet Take 1 tablet (30 mg total) by mouth 3 (three) times daily.  05/24/15  Yes Bayard Hugger, NP  morphine (MSIR) 15 MG tablet Take 1- 2 Tablets  Daily  as needed for severe pain ( 15- 30 mg) 05/24/15  Yes Bayard Hugger, NP  PARoxetine (PAXIL) 40 MG tablet TAKE 1 AND 1/2 TABLETS BY MOUTH  EVERY DAY 02/16/15  Yes Olam Idler, MD  pravastatin (PRAVACHOL) 40 MG tablet TAKE 1 TABLET(40 MG) BY MOUTH EVERY EVENING 05/30/15  Yes Olam Idler, MD  tiZANidine (ZANAFLEX) 2 MG tablet TAKE 1 TABLET(2 MG) BY MOUTH THREE TIMES DAILY 03/09/15  Yes Charlett Blake, MD   BP 143/66 mmHg  Pulse 87  Temp(Src) 98.7 F (37.1 C) (Oral)  Resp 18  Ht 6' (1.829 m)  Wt 131.543 kg  BMI 39.32 kg/m2  SpO2 98% Physical Exam  Constitutional: She is oriented to person, place, and time. She appears well-developed and well-nourished. No distress.  HENT:  Head: Normocephalic and atraumatic.  Right Ear: External ear normal.  Left Ear: External ear normal.  Mouth/Throat: Oropharynx is clear and moist. No oropharyngeal exudate.  Eyes: Conjunctivae and EOM are normal. Pupils are equal, round, and reactive to light. No scleral icterus.  Neck: Normal range of motion. Neck supple. No JVD present. No tracheal deviation present. No thyromegaly present.  Cardiovascular: Normal rate, regular rhythm and intact distal pulses.  Exam reveals no gallop and no friction rub.   No murmur heard. Pulmonary/Chest: Effort normal and breath sounds normal. No respiratory distress. She has no wheezes. She has no rales.  Abdominal: Soft. Bowel sounds are normal. She exhibits no distension. There is tenderness (left CVA tenderness. No other abdominal tenderness).  Musculoskeletal: Normal range of motion. She exhibits no edema.  Neurological: She is alert and oriented to person, place, and time. No cranial nerve deficit. She exhibits normal muscle tone. Coordination normal.  Skin: Skin is warm and dry. She is not diaphoretic. No pallor.  Psychiatric: She has a normal mood and affect. She expresses no homicidal and no suicidal ideation. She expresses no suicidal plans and no homicidal plans.  Nursing note and vitals reviewed.   ED Course  Procedures (including critical care time) Labs Review Labs Reviewed  COMPREHENSIVE  METABOLIC PANEL - Abnormal; Notable for the following:    Glucose, Bld 150 (*)    AST 49 (*)    Anion gap 17 (*)    All other components within normal limits  URINALYSIS, ROUTINE W REFLEX MICROSCOPIC (NOT AT [facility]) - Abnormal; Notable for the following:    APPearance HAZY (*)    Ketones, ur 15 (*)    Protein, ur 100 (*)    All other components within normal limits  URINE MICROSCOPIC-ADD ON - Abnormal; Notable for the following:    Squamous Epithelial / LPF 0-5 (*)    Bacteria, UA RARE (*)    All other components within normal limits  LIPASE, BLOOD  CBC    Imaging Review No results found. I have personally reviewed and evaluated these images and lab results as part of my medical decision-making.   EKG Interpretation None      MDM   Final diagnoses:  Left flank pain    The patient is a 45 year old female with a history of chronic pain who presents with left flank pain for the past 2 days. She denies any trauma or other obvious cause of pain. Patient reports some urinary frequency but denies any dysuria or fever. While patient does have history of kidney stone she reports  this pain is very different from her previous kidney stone pain. Physical exam benign as above with patient with muscular tenderness to palpation but no CVA tenderness. Benign abdomen. Do not suspect kidney stone based on no CVA tenderness and no blood in urine. Kidney function normal and no signs of infection on urinalysis. Patient was given Toradol with moderate relief of pain. Feel patient likely has muscle skeletal pain.  I estimate there is LOW risk for ACUTE APPENDICITIS, BOWEL OBSTRUCTION, CHOLECYSTITIS, DIVERTICULITIS, INCARCERATED HERNIA, MESENTERIC ISCHEMIA, PANCREATITIS, or PERFORATED BOWEL or ULCER, thus I consider the discharge disposition reasonable. Also, there is no evidence or peritonitis, sepsis or toxicity. We have discussed the diagnosis and risks and agree with discharging home to follow-up with  their primary doctor. We also discussed returning to the Emergency Department immediately if new or worsening symptoms occur and have discussed the symptoms which are most concerning (e.g., bloody stool, fever, changing or worsening pain, vomiting) that necessitate immediate return.   Patient seen with attending, Dr. Ashok Cordia, who oversaw clinical decision making.     Margaretann Loveless, MD 06/06/15 2012  Lajean Saver, MD 06/07/15 [phone]

## 2015-06-06 NOTE — ED Notes (Signed)
Dr. Rhina Brackett MD at bedside.

## 2015-06-06 NOTE — ED Notes (Signed)
Pt reports taking 30mg  ER morphine approx 1730.

## 2015-06-07 NOTE — Progress Notes (Signed)
Urine drug screen for this encounter is consistent for prescribed medication 

## 2015-06-13 ENCOUNTER — Ambulatory Visit: Payer: Medicaid Other | Admitting: Family Medicine

## 2015-06-13 ENCOUNTER — Other Ambulatory Visit: Payer: Self-pay | Admitting: Physical Medicine & Rehabilitation

## 2015-06-13 ENCOUNTER — Other Ambulatory Visit: Payer: Self-pay | Admitting: Family Medicine

## 2015-06-16 ENCOUNTER — Other Ambulatory Visit: Payer: Self-pay | Admitting: Family Medicine

## 2015-06-16 ENCOUNTER — Other Ambulatory Visit: Payer: Self-pay | Admitting: Registered Nurse

## 2015-06-19 ENCOUNTER — Other Ambulatory Visit: Payer: Self-pay | Admitting: Family Medicine

## 2015-06-21 ENCOUNTER — Encounter: Payer: Medicaid Other | Attending: Physical Medicine & Rehabilitation | Admitting: Registered Nurse

## 2015-06-21 ENCOUNTER — Encounter: Payer: Self-pay | Admitting: Registered Nurse

## 2015-06-21 VITALS — BP 148/81 | HR 98

## 2015-06-21 DIAGNOSIS — R270 Ataxia, unspecified: Secondary | ICD-10-CM

## 2015-06-21 DIAGNOSIS — M25562 Pain in left knee: Secondary | ICD-10-CM | POA: Diagnosis present

## 2015-06-21 DIAGNOSIS — G894 Chronic pain syndrome: Secondary | ICD-10-CM | POA: Diagnosis not present

## 2015-06-21 DIAGNOSIS — Z79899 Other long term (current) drug therapy: Secondary | ICD-10-CM

## 2015-06-21 DIAGNOSIS — F341 Dysthymic disorder: Secondary | ICD-10-CM | POA: Diagnosis not present

## 2015-06-21 DIAGNOSIS — Z5181 Encounter for therapeutic drug level monitoring: Secondary | ICD-10-CM | POA: Insufficient documentation

## 2015-06-21 DIAGNOSIS — G629 Polyneuropathy, unspecified: Secondary | ICD-10-CM

## 2015-06-21 DIAGNOSIS — M25561 Pain in right knee: Secondary | ICD-10-CM | POA: Diagnosis present

## 2015-06-21 DIAGNOSIS — R278 Other lack of coordination: Secondary | ICD-10-CM

## 2015-06-21 MED ORDER — MORPHINE SULFATE 15 MG PO TABS: ORAL_TABLET | ORAL | Status: DC

## 2015-06-21 MED ORDER — MORPHINE SULFATE ER 30 MG PO TBCR: 30.0000 mg | EXTENDED_RELEASE_TABLET | Freq: Three times a day (TID) | ORAL | Status: DC

## 2015-06-21 MED ORDER — CLONAZEPAM 0.5 MG PO TABS: 1.5000 mg | ORAL_TABLET | Freq: Every day | ORAL | Status: DC

## 2015-06-21 NOTE — Progress Notes (Signed)
Subjective:    Patient ID: [name], female    DOB: 10-Aug-1970, 45 y.o.   MRN: [id number]  HPI: [name] is a 45 year old female who returns for follow up for chronic pain and medication refill. She states her pain is located in her bilateral hands, bilateral lower extremities and bilateral feet.She rates her pain 8. Her current exercise regime is walking. Also states she went to [facility] ED on 06/06/15 for flank pain.  Pain Inventory Average Pain 8 Pain Right Now 8 My pain is constant, sharp, burning, dull, stabbing, tingling and aching  In the last 24 hours, has pain interfered with the following? General activity 8 Relation with others 8 Enjoyment of life 8 What TIME of day is your pain at its worst? morning Sleep (in general) .  Pain is worse with: walking, bending, standing and some activites Pain improves with: rest Relief from Meds: 6  Mobility use a cane how many minutes can you walk? 5 ability to climb steps?  no do you drive?  no use a wheelchair Do you have any goals in this area?  yes  Function disabled: date disabled . I need assistance with the following:  bathing, household duties and shopping Do you have any goals in this area?  yes  Neuro/Psych No problems in this area  Prior Studies Any changes since last visit?  no  Physicians involved in your care Any changes since last visit?  no   Family History  Problem Relation Age of Onset  . Stroke Maternal Grandmother   . Colon cancer Maternal Grandmother   . Heart Problems Paternal Grandmother   . Dementia Paternal Grandmother   . Heart attack Paternal Grandfather    Social History   Social History  . Marital Status: Legally Separated    Spouse Name: N/A  . Number of Children: N/A  . Years of Education: N/A   Social History Main Topics  . Smoking status: Never Smoker   . Smokeless tobacco: Never Used  . Alcohol Use: None  . Drug Use: None  . Sexual Activity:  Not Asked   Other Topics Concern  . None   Social History Narrative   Past Surgical History  Procedure Laterality Date  . Cholecystectomy    . Abdominoplasty     Past Medical History  Diagnosis Date  . 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 DEPRESSION/ANXIETY 08/08/2009  . IBS 02/02/2010  . Polyneuropathy ([place]) 12/04/2010   There were no vitals taken for this visit.  Opioid Risk Score:   Fall Risk Score:  `1  Depression screen PHQ 2/9  Depression screen [facility] 2/9 02/14/2015 02/01/2015 12/17/2014 11/22/2014 07/01/2014 12/11/2012  Decreased Interest 2 0 3 3 2 3   Down, Depressed, Hopeless 3 0 3 3 2 3   PHQ - 2 Score 5 0 6 6 4 6   Altered sleeping 2 - - - 3 -  Tired, decreased energy 3 - - - 3 -  Change in appetite 1 - - - 2 -  Feeling bad or failure about yourself  3 - - - 3 -  Trouble concentrating 1 - - - 1 -  Moving slowly or fidgety/restless 0 - - - 0 -  Suicidal thoughts 1 - - - 1 -  PHQ-9 Score 16 - - - 17 -  Difficult doing work/chores Very difficult - - - Extremely dIfficult -  Review of Systems     Objective:   Physical Exam  Constitutional: She is oriented to person, place, and time. She appears well-developed and well-nourished.  HENT:  Head: Normocephalic and atraumatic.  Neck: Normal range of motion. Neck supple.  Cardiovascular: Normal rate and regular rhythm.   Pulmonary/Chest: Effort normal and breath sounds normal.  Musculoskeletal:  Normal Muscle Bulk and Muscle Testing Reveals: Upper Extremities: Full ROM and Muscle Strength 5/5 Lower Extremities: Full ROM and Muscle Strength 5/5 Arises from chair with ease Narrow Based Gait  Neurological: She is alert and oriented to person, place, and time.  Skin: Skin is warm and dry.  Psychiatric: She has a normal mood and affect.  Nursing note and vitals reviewed.         Assessment & Plan:  1. Severe peripheral  polyneuropathy of undetermined etiology.:  Refilled: MS Contin 30 mg one tablet three times a day#90 and MSIR 15 mg one- two tablet's daily as needed # 60. 2. Bilateral progressive upper extremity hand pain. Continue with current medication regime. Tight Glucose Control. Adhere to healthy diet regime. 3. Muscle Spasms: Continue Zanaflex.  4. Depression/Anxiety: Continue Klonopin. Waiting on Psychiatry appointment 5. Loss of Coordination: Referral Neurology has been made, she is awaiting an appointment.  20 minutes of face to face patient care time spent during this visit. All questions were encouraged and answered.   F/U in 1 month

## 2015-06-21 NOTE — Patient Instructions (Signed)
[facility]  [phone]

## 2015-07-05 NOTE — Telephone Encounter (Signed)
Medication was ordered, at visit.

## 2015-07-13 ENCOUNTER — Encounter: Payer: Self-pay | Admitting: Certified Nurse Midwife

## 2015-07-13 ENCOUNTER — Ambulatory Visit (INDEPENDENT_AMBULATORY_CARE_PROVIDER_SITE_OTHER): Payer: Medicaid Other | Admitting: Certified Nurse Midwife

## 2015-07-13 VITALS — BP 160/92 | Wt 291.0 lb

## 2015-07-13 DIAGNOSIS — N939 Abnormal uterine and vaginal bleeding, unspecified: Secondary | ICD-10-CM | POA: Insufficient documentation

## 2015-07-13 DIAGNOSIS — Z113 Encounter for screening for infections with a predominantly sexual mode of transmission: Secondary | ICD-10-CM

## 2015-07-13 DIAGNOSIS — Z01419 Encounter for gynecological examination (general) (routine) without abnormal findings: Secondary | ICD-10-CM

## 2015-07-13 DIAGNOSIS — Z794 Long term (current) use of insulin: Secondary | ICD-10-CM

## 2015-07-13 DIAGNOSIS — Z0000 Encounter for general adult medical examination without abnormal findings: Secondary | ICD-10-CM | POA: Diagnosis not present

## 2015-07-13 DIAGNOSIS — E1140 Type 2 diabetes mellitus with diabetic neuropathy, unspecified: Secondary | ICD-10-CM

## 2015-07-13 NOTE — Progress Notes (Signed)
Patient ID: [name], female   DOB: [birth year], 45 y.o.   MRN: [id number]    Subjective:     [name] is a 45 y.o. female here for a routine exam.  Current complaints: AUB with periods, large clots.  Has DM and peripheral neuropathy.  Monthly periods, lasting 4-5 days, last period had large clots.    Has had lower left pelvic pain off and on for years.  Desires STD screening without HIV.    Personal health questionnaire:  Is patient Ashkenazi Jewish, have a family history of breast and/or ovarian cancer: no Is there a family history of uterine cancer diagnosed at age < 45, gastrointestinal cancer, urinary tract cancer, family member who is a Field seismologist syndrome-associated carrier: yes Is the patient overweight and hypertensive, family history of diabetes, personal history of gestational diabetes, preeclampsia or PCOS: yes Is patient over 39, have PCOS,  family history of premature CHD under age 61, diabetes, smoke, have hypertension or peripheral artery disease:  yes At any time, has a partner hit, kicked or otherwise hurt or frightened you?: no Over the past 2 weeks, have you felt down, depressed or hopeless?: no Over the past 2 weeks, have you felt little interest or pleasure in doing things?:no   Gynecologic History Patient's last menstrual period was 07/04/2015. Contraception: none Last Pap: unknown. Results were: normal according to the patient Last mammogram: unknown. Results were: normal according to the patient, has not had one since she was 45 years of age.    Obstetric History OB History  No data available    Past Medical History  Diagnosis Date  . 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 DEPRESSION/ANXIETY 08/08/2009  . IBS 02/02/2010  . Polyneuropathy ([place]) 12/04/2010    Past Surgical History  Procedure Laterality Date  . Cholecystectomy    . Abdominoplasty       Current  outpatient prescriptions:  .  aspirin 81 MG tablet, Take 81 mg by mouth daily., Disp: , Rfl:  .  b complex vitamins capsule, Take 1 capsule by mouth daily., Disp: , Rfl:  .  clonazePAM (KLONOPIN) 0.5 MG tablet, Take 3 tablets (1.5 mg total) by mouth at bedtime., Disp: 90 tablet, Rfl: 2 .  clonazePAM (KLONOPIN) 2 MG tablet, TAKE 1 TABLET BY MOUTH AT BEDTIME, Disp: 30 tablet, Rfl: 0 .  enalapril (VASOTEC) 20 MG tablet, TAKE 1 TABLET (20 MG TOTAL) BY MOUTH DAILY., Disp: 90 tablet, Rfl: 0 .  glipiZIDE (GLUCOTROL) 10 MG tablet, TAKE 1 TABLET BY MOUTH TWICE DAILY WITH MEALS, Disp: 180 tablet, Rfl: 1 .  metFORMIN (GLUCOPHAGE) 1000 MG tablet, TAKE 1 TABLET BY MOUTH TWICE DAILY WITH MEALS, Disp: 180 tablet, Rfl: 0 .  morphine (MS CONTIN) 30 MG 12 hr tablet, Take 1 tablet (30 mg total) by mouth 3 (three) times daily., Disp: 90 tablet, Rfl: 0 .  morphine (MSIR) 15 MG tablet, Take 1- 2 Tablets  Daily  as needed for severe pain ( 15- 30 mg), Disp: 60 tablet, Rfl: 0 .  PARoxetine (PAXIL) 40 MG tablet, TAKE 1 AND 1/2 TABLETS BY MOUTH EVERY DAY, Disp: 135 tablet, Rfl: 0 .  pravastatin (PRAVACHOL) 40 MG tablet, TAKE 1 TABLET(40 MG) BY MOUTH EVERY EVENING, Disp: 90 tablet, Rfl: 0 .  tiZANidine (ZANAFLEX) 2 MG tablet, TAKE 1 TABLET BY MOUTH THREE TIMES DAILY, Disp: 270 tablet, Rfl: 0 Allergies  Allergen Reactions  . Codeine  confusion  . Nucynta [Tapentadol] Other (See Comments)    headaches  . 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 Smoking status: Never Smoker   . Smokeless tobacco: Never Used  . Alcohol Use: No    Family History  Problem Relation Age of Onset  . Stroke Maternal Grandmother   . Colon cancer Maternal Grandmother   . Heart Problems Paternal Grandmother   . Dementia Paternal Grandmother   . Heart attack Paternal Grandfather       Review of Systems  Constitutional: negative for fatigue and weight loss Respiratory: negative for cough and  wheezing Cardiovascular: negative for chest pain, fatigue and palpitations Gastrointestinal: negative for abdominal pain and change in bowel habits Musculoskeletal:negative for myalgias Neurological: negative for gait problems and tremors Behavioral/Psych: negative for abusive relationship, depression Endocrine: negative for temperature intolerance   Genitourinary:+ for abnormal menstrual periods, negative for genital lesions, hot flashes, sexual problems and vaginal discharge Integument/breast: negative for breast lump, breast tenderness, nipple discharge and skin lesion(s)    Objective:       Wt 291 lb (131.997 kg)  LMP 07/04/2015 General:   alert  Skin:   no rash or abnormalities  Lungs:   clear to auscultation bilaterally  Heart:   regular rate and rhythm, S1, S2 normal, no murmur, click, rub or gallop  Breasts:   normal without suspicious masses, skin or nipple changes or axillary nodes  Abdomen:  normal findings: no organomegaly, soft, non-tender and no hernia  obese  Pelvis:  External genitalia: normal general appearance Urinary system: urethral meatus normal and bladder without fullness, nontender Vaginal: normal without tenderness, induration or masses Cervix: normal appearance Adnexa: normal bimanual exam Uterus: anteverted and non-tender, slightly enlarged in size   Lab Review Urine pregnancy test Labs reviewed yes Radiologic studies reviewed no  50% of 30 min visit spent on counseling and coordination of care.   Assessment:    Healthy female exam.   STD screening exam  DM2: uncontrolled  H/O neuropathy  AUB  Plan:    Education reviewed: calcium supplements, depression evaluation, low fat, low cholesterol diet, safe sex/STD prevention, self breast exams, skin cancer screening, weight bearing exercise and health maitanence. Contraception: condoms. Mammogram ordered. Follow up in: 1 month after pelvic US.   No orders of the defined types were placed in  this encounter.   Orders Placed This Encounter  Procedures  . NuSwab Vaginitis Plus (VG+)   Need to obtain previous records Possible management options include:  Dr. Zigmund Daniel GYN Follow up as needed.

## 2015-07-14 LAB — HEPATITIS B SURFACE ANTIGEN: Hepatitis B Surface Ag: NEGATIVE

## 2015-07-14 LAB — HEPATITIS C ANTIBODY: Hep C Virus Ab: <0.1 s/co ratio (ref 0.0–0.9)

## 2015-07-14 LAB — RPR: RPR Ser Ql: NONREACTIVE

## 2015-07-15 LAB — PAP IG AND HPV HIGH-RISK
HPV, HIGH-RISK: NEGATIVE
PAP SMEAR COMMENT: 0

## 2015-07-16 LAB — NUSWAB VAGINITIS PLUS (VG+)
CANDIDA GLABRATA, NAA: NEGATIVE
CHLAMYDIA TRACHOMATIS, NAA: NEGATIVE
Candida albicans, NAA: NEGATIVE
Neisseria gonorrhoeae, NAA: NEGATIVE
TRICH VAG BY NAA: NEGATIVE

## 2015-07-18 ENCOUNTER — Ambulatory Visit (INDEPENDENT_AMBULATORY_CARE_PROVIDER_SITE_OTHER): Payer: Medicaid Other | Admitting: Family Medicine

## 2015-07-18 ENCOUNTER — Encounter: Payer: Self-pay | Admitting: Family Medicine

## 2015-07-18 VITALS — BP 126/50 | HR 87 | Temp 99.6°F | Ht 72.0 in | Wt 293.0 lb

## 2015-07-18 DIAGNOSIS — G4710 Hypersomnia, unspecified: Secondary | ICD-10-CM

## 2015-07-18 DIAGNOSIS — K580 Irritable bowel syndrome with diarrhea: Secondary | ICD-10-CM | POA: Diagnosis not present

## 2015-07-18 DIAGNOSIS — E119 Type 2 diabetes mellitus without complications: Secondary | ICD-10-CM | POA: Diagnosis not present

## 2015-07-18 DIAGNOSIS — E781 Pure hyperglyceridemia: Secondary | ICD-10-CM

## 2015-07-18 DIAGNOSIS — IMO0001 Reserved for inherently not codable concepts without codable children: Secondary | ICD-10-CM

## 2015-07-18 DIAGNOSIS — R400 Somnolence: Secondary | ICD-10-CM | POA: Insufficient documentation

## 2015-07-18 LAB — POCT GLYCOSYLATED HEMOGLOBIN (HGB A1C): Hemoglobin A1C: 8.2

## 2015-07-18 NOTE — Patient Instructions (Signed)
I Referred you to gastroenterology for your colonoscopy and evaluation of IBS.   I referred you for the home sleep study.  Please let me know if you have not heard about scheduling this in the next 1-2 weeks.   I ordered blood work to evaluate your cholesterol and diabetes.  I will notify you with these results.   Please make an appointment if you continued to have dizziness and wish to discuss this further.

## 2015-07-19 LAB — LIPID PANEL
Cholesterol: 169 mg/dL (ref 125–200)
HDL: 37 mg/dL — ABNORMAL LOW (ref >=46)
Total CHOL/HDL Ratio: 4.6 Ratio (ref <=5.0)
Triglycerides: 678 mg/dL — ABNORMAL HIGH (ref <150)

## 2015-07-19 LAB — MICROALBUMIN, URINE: MICROALB UR: 9.2 mg/dL

## 2015-07-20 ENCOUNTER — Other Ambulatory Visit: Payer: Self-pay | Admitting: Family Medicine

## 2015-07-20 ENCOUNTER — Ambulatory Visit (HOSPITAL_COMMUNITY): Payer: Medicaid Other

## 2015-07-21 NOTE — Assessment & Plan Note (Signed)
Home sleep study ordered

## 2015-07-21 NOTE — Assessment & Plan Note (Signed)
Rereferred to our GI for colonoscopy and abdominal pain likely secondary to IBS and chronic opioid therapy

## 2015-07-21 NOTE — Progress Notes (Signed)
  Patient name: [name] MRN [id number]  Date of birth: 03-15-1971  CC & HPI:  [name] is a 45 y.o. female presenting today for DM, abdominal pain, daytime somnolence, and Hypertriglyceridemia.   DM - She reports her blood sugars under 200 - Says she was referred to endocrinology by her gynecologist, and has appointment scheduled  Abdominal pain - Reports continued generalize abdominal pain associated with diarrhea - Request referral to our GI for colonoscopy and follow-up for IBS  Daytime somnolence - Reports erratic sleep schedule; says she is unable to perform split night sleep study - Request home sleep study for evaluation of pressure, sleep apnea - Reports nocturnal snoring and choking  Hypertriglyceridemia - Reports compliance with pravastatin 40 mg daily at bedtime  Smoking History Noted  Objective Findings:  Vitals: BP 126/50 mmHg  Pulse 87  Temp(Src) 99.6 F (37.6 C) (Oral)  Ht 6' (1.829 m)  Wt 293 lb (132.904 kg)  BMI 39.73 kg/m2  SpO2 99%  LMP 07/04/2015  Gen: NAD; Obese CV: RRR w/o m/r/g, pulses +2 b/l Resp: CTAB w/ normal respiratory effort  Assessment & Plan:   IBS Rereferred to our GI for colonoscopy and abdominal pain likely secondary to IBS and chronic opioid therapy   DM2 (diabetes mellitus, type 2) She reports she has upcoming appointment with endocrinologist and will follow with them for her diabetes care  Hypertriglyceridemia Lipid panel  Somnolence, daytime Home sleep study ordered

## 2015-07-21 NOTE — Assessment & Plan Note (Signed)
She reports she has upcoming appointment with endocrinologist and will follow with them for her diabetes care

## 2015-07-21 NOTE — Assessment & Plan Note (Signed)
Lipid panel 

## 2015-07-25 ENCOUNTER — Encounter: Payer: Self-pay | Admitting: Family Medicine

## 2015-07-25 ENCOUNTER — Encounter: Payer: Medicaid Other | Attending: Physical Medicine & Rehabilitation | Admitting: Registered Nurse

## 2015-07-25 ENCOUNTER — Encounter: Payer: Self-pay | Admitting: Registered Nurse

## 2015-07-25 VITALS — BP 133/75 | HR 103 | Resp 14

## 2015-07-25 DIAGNOSIS — M25561 Pain in right knee: Secondary | ICD-10-CM | POA: Insufficient documentation

## 2015-07-25 DIAGNOSIS — G894 Chronic pain syndrome: Secondary | ICD-10-CM | POA: Diagnosis not present

## 2015-07-25 DIAGNOSIS — F341 Dysthymic disorder: Secondary | ICD-10-CM | POA: Diagnosis not present

## 2015-07-25 DIAGNOSIS — Z5181 Encounter for therapeutic drug level monitoring: Secondary | ICD-10-CM | POA: Insufficient documentation

## 2015-07-25 DIAGNOSIS — G629 Polyneuropathy, unspecified: Secondary | ICD-10-CM | POA: Diagnosis not present

## 2015-07-25 DIAGNOSIS — M25562 Pain in left knee: Secondary | ICD-10-CM | POA: Diagnosis present

## 2015-07-25 DIAGNOSIS — Z79899 Other long term (current) drug therapy: Secondary | ICD-10-CM | POA: Diagnosis present

## 2015-07-25 MED ORDER — MORPHINE SULFATE 15 MG PO TABS: ORAL_TABLET | ORAL | Status: DC

## 2015-07-25 MED ORDER — MORPHINE SULFATE ER 30 MG PO TBCR: 30.0000 mg | EXTENDED_RELEASE_TABLET | Freq: Three times a day (TID) | ORAL | Status: DC

## 2015-07-25 MED ORDER — MORPHINE SULFATE ER 30 MG PO TBCR
30.0000 mg | EXTENDED_RELEASE_TABLET | Freq: Three times a day (TID) | ORAL | Status: DC
Start: 2015-07-25 — End: 2015-07-25

## 2015-07-25 NOTE — Progress Notes (Signed)
Subjective:    Patient ID: [name], female    DOB: 12-14-1970, 45 y.o.   MRN: [id number]  HPI: [name] is a 45 year old female who returns for follow up for chronic pain and medication refill. She states her pain is located in her bilateral hands, bilateral lower extremities, bilateral knees and bilateral feet.She rates her pain 8. Her current exercise regime is walking.  Pain Inventory Average Pain 7 Pain Right Now 8 My pain is intermittent, constant, sharp, burning, dull, stabbing, tingling and aching  In the last 24 hours, has pain interfered with the following? General activity 10 Relation with others 9 Enjoyment of life 10 What TIME of day is your pain at its worst? all Sleep (in general) Poor  Pain is worse with: walking, sitting, standing and some activites Pain improves with: rest and medication Relief from Meds: 6  Mobility walk without assistance walk with assistance use a cane how many minutes can you walk? 5 ability to climb steps?  no do you drive?  no use a wheelchair needs help with transfers transfers alone Do you have any goals in this area?  yes  Function disabled: date disabled . I need assistance with the following:  meal prep, household duties and shopping Do you have any goals in this area?  yes  Neuro/Psych weakness numbness tingling trouble walking spasms dizziness depression anxiety  Prior Studies Any changes since last visit?  no  Physicians involved in your care Any changes since last visit?  no   Family History  Problem Relation Age of Onset  . Stroke Maternal Grandmother   . Colon cancer Maternal Grandmother   . Heart Problems Paternal Grandmother   . Dementia Paternal Grandmother   . Heart attack Paternal Grandfather    Social History   Social History  . Marital Status: Legally Separated    Spouse Name: N/A  . Number of Children: N/A  . Years of Education: N/A   Social History Main  Topics  . Smoking status: Never Smoker   . Smokeless tobacco: Never Used  . Alcohol Use: No  . Drug Use: No     Comment: rx opiods  . Sexual Activity: Not Asked   Other Topics Concern  . None   Social History Narrative   Past Surgical History  Procedure Laterality Date  . Cholecystectomy    . Abdominoplasty     Past Medical History  Diagnosis Date  . 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 DEPRESSION/ANXIETY 08/08/2009  . IBS 02/02/2010  . Polyneuropathy ([place]) 12/04/2010   BP 133/75 mmHg  Pulse 103  Resp 14  SpO2 98%  LMP 07/04/2015  Opioid Risk Score:   Fall Risk Score:  `1  Depression screen PHQ 2/9  Depression screen [facility] 2/9 07/18/2015 02/14/2015 02/01/2015 12/17/2014 11/22/2014 07/01/2014 12/11/2012  Decreased Interest 0 2 0 3 3 2 3   Down, Depressed, Hopeless 0 3 0 3 3 2 3   PHQ - 2 Score 0 5 0 6 6 4 6   Altered sleeping - 2 - - - 3 -  Tired, decreased energy - 3 - - - 3 -  Change in appetite - 1 - - - 2 -  Feeling bad or failure about yourself  - 3 - - - 3 -  Trouble concentrating - 1 - - - 1 -  Moving slowly or fidgety/restless - 0 - - - 0 -  Suicidal thoughts - 1 - - - 1 -  PHQ-9 Score - 16 - - - 17 -  Difficult doing work/chores - Very difficult - - - Extremely dIfficult -     Review of Systems  Constitutional: Positive for fever, chills and diaphoresis.  Gastrointestinal: Positive for nausea, vomiting, abdominal pain, diarrhea and constipation.  Endocrine:       High blood sugar       Objective:   Physical Exam  Constitutional: She is oriented to person, place, and time. She appears well-developed and well-nourished.  HENT:  Head: Normocephalic and atraumatic.  Neck: Normal range of motion. Neck supple.  Cardiovascular: Normal rate and regular rhythm.   Pulmonary/Chest: Effort normal and breath sounds normal.  Musculoskeletal:  Normal Muscle Bulk and Muscle Testing  Reveals: Upper Extremities: Full ROM and Muscle Strength 5/5 Lower Extremities: Full ROM and Muscle Strength 5/5 Arises from chair with ease using straight cane support Narrow Based Gait  Neurological: She is alert and oriented to person, place, and time.  Skin: Skin is warm and dry.  Psychiatric: She has a normal mood and affect.  Nursing note and vitals reviewed.         Assessment & Plan:  1. Severe peripheral polyneuropathy of undetermined etiology.:  Refilled: MS Contin 30 mg one tablet three times a day#90 and MSIR 15 mg one- two tablet's daily as needed # 60. We will continue the opioid monitoring program, this consists of regular clinic visits, examinations, urine drug screen, pill counts as well as use of [place] Controlled Substance Reporting System. 2. Bilateral progressive upper extremity hand pain. Continue with current medication regime. Tight Glucose Control. Adhere to healthy diet regime. 3. Muscle Spasms: Continue Zanaflex.  4. Depression/Anxiety: Continue Klonopin. Waiting on Psychiatry appointment.  20 minutes of face to face patient care time spent during this visit. All questions were encouraged and answered.   F/U in 1 month

## 2015-07-28 ENCOUNTER — Ambulatory Visit (HOSPITAL_COMMUNITY)
Admission: RE | Admit: 2015-07-28 | Discharge: 2015-07-28 | Disposition: A | Discharge status: Home / Self Care | Payer: Medicaid Other | Source: Ambulatory Visit | Attending: Certified Nurse Midwife | Admitting: Certified Nurse Midwife

## 2015-07-28 DIAGNOSIS — N939 Abnormal uterine and vaginal bleeding, unspecified: Secondary | ICD-10-CM | POA: Diagnosis not present

## 2015-07-28 DIAGNOSIS — D259 Leiomyoma of uterus, unspecified: Secondary | ICD-10-CM | POA: Diagnosis not present

## 2015-08-01 ENCOUNTER — Telehealth: Payer: Self-pay | Admitting: *Deleted

## 2015-08-01 NOTE — Telephone Encounter (Signed)
Patient is calling for her [place] results. Request that you call- permission left to leave message on answering machine.

## 2015-08-02 ENCOUNTER — Other Ambulatory Visit: Payer: Self-pay | Admitting: Certified Nurse Midwife

## 2015-08-02 DIAGNOSIS — D259 Leiomyoma of uterus, unspecified: Secondary | ICD-10-CM

## 2015-08-02 NOTE — Telephone Encounter (Signed)
Left VM message.  Has small fibroid.  Recommended Mirena IUD.

## 2015-08-02 NOTE — Progress Notes (Signed)
Left message on VM about recent pelvic US.  Small fibroid noted.  Recommended Mirena IUD.  Encouraged patient to call clinic back to schedule an appointment to discuss.

## 2015-08-11 ENCOUNTER — Other Ambulatory Visit: Payer: Self-pay | Admitting: Family Medicine

## 2015-08-11 ENCOUNTER — Telehealth: Payer: Self-pay | Admitting: Family Medicine

## 2015-08-11 DIAGNOSIS — R400 Somnolence: Secondary | ICD-10-CM

## 2015-08-11 NOTE — Telephone Encounter (Signed)
Need referral for sleep study at [facility]

## 2015-08-11 NOTE — Telephone Encounter (Signed)
Will forward to MD.  

## 2015-08-17 ENCOUNTER — Ambulatory Visit: Payer: Medicaid Other | Admitting: "Endocrinology

## 2015-08-24 ENCOUNTER — Ambulatory Visit: Payer: Medicaid Other | Admitting: Neurology

## 2015-08-26 ENCOUNTER — Encounter: Payer: Medicaid Other | Attending: Physical Medicine & Rehabilitation | Admitting: Registered Nurse

## 2015-08-26 ENCOUNTER — Encounter: Payer: Self-pay | Admitting: Registered Nurse

## 2015-08-26 VITALS — BP 96/65 | HR 97 | Resp 16

## 2015-08-26 DIAGNOSIS — Z79899 Other long term (current) drug therapy: Secondary | ICD-10-CM | POA: Diagnosis present

## 2015-08-26 DIAGNOSIS — G629 Polyneuropathy, unspecified: Secondary | ICD-10-CM | POA: Diagnosis not present

## 2015-08-26 DIAGNOSIS — M25561 Pain in right knee: Secondary | ICD-10-CM | POA: Insufficient documentation

## 2015-08-26 DIAGNOSIS — M25562 Pain in left knee: Secondary | ICD-10-CM | POA: Diagnosis present

## 2015-08-26 DIAGNOSIS — Z5181 Encounter for therapeutic drug level monitoring: Secondary | ICD-10-CM | POA: Insufficient documentation

## 2015-08-26 DIAGNOSIS — G894 Chronic pain syndrome: Secondary | ICD-10-CM | POA: Diagnosis present

## 2015-08-26 DIAGNOSIS — F341 Dysthymic disorder: Secondary | ICD-10-CM

## 2015-08-26 MED ORDER — MORPHINE SULFATE ER 30 MG PO TBCR: 30.0000 mg | EXTENDED_RELEASE_TABLET | Freq: Three times a day (TID) | ORAL | Status: DC

## 2015-08-26 MED ORDER — MORPHINE SULFATE 15 MG PO TABS: ORAL_TABLET | ORAL | Status: DC

## 2015-08-26 NOTE — Progress Notes (Signed)
Subjective:    Patient ID: [name], female    DOB: [birth year], 45 y.o.   MRN: [id number]  HPI: [name] is a 45 year old female who returns for follow up for chronic pain and medication refill. She states her pain is located in her bilateral hands, bilateral lower extremities, bilateral knees and bilateral feet.She rates her pain 10.  States she and her daughter attended an event in [place] last week and she over extended herself. Encouraged not to over extend herself with outside activities she verbalizes understanding.Her current exercise regime is walking.  Pain Inventory Average Pain 10 Pain Right Now 10 My pain is constant, sharp, burning, dull, stabbing, tingling and aching  In the last 24 hours, has pain interfered with the following? General activity 10 Relation with others 10 Enjoyment of life 10 What TIME of day is your pain at its worst? ALL Sleep (in general) Fair  Pain is worse with: walking, standing and some activites Pain improves with: medication Relief from Meds: 4  Mobility walk without assistance walk with assistance use a cane how many minutes can you walk? 5 ability to climb steps?  yes do you drive?  yes Do you have any goals in this area?  yes  Function disabled: date disabled NA I need assistance with the following:  bathing, meal prep, household duties and shopping Do you have any goals in this area?  yes  Neuro/Psych weakness numbness tremor tingling trouble walking spasms dizziness depression anxiety  Prior Studies Any changes since last visit?  no  Physicians involved in your care Any changes since last visit?  no   Family History  Problem Relation Age of Onset  . Stroke Maternal Grandmother   . Colon cancer Maternal Grandmother   . Heart Problems Paternal Grandmother   . Dementia Paternal Grandmother   . Heart attack Paternal Grandfather    Social History   Social History  . Marital Status:  Legally Separated    Spouse Name: N/A  . Number of Children: N/A  . Years of Education: N/A   Social History Main Topics  . Smoking status: Never Smoker   . Smokeless tobacco: Never Used  . Alcohol Use: No  . Drug Use: No     Comment: rx opiods  . Sexual Activity: Not Asked   Other Topics Concern  . None   Social History Narrative   Past Surgical History  Procedure Laterality Date  . Cholecystectomy    . Abdominoplasty     Past Medical History  Diagnosis Date  . 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 DEPRESSION/ANXIETY 08/08/2009  . IBS 02/02/2010  . Polyneuropathy ([place]) 12/04/2010   BP 96/65 mmHg  Pulse 97  Resp 16  SpO2 98%  Opioid Risk Score:   Fall Risk Score:  `1  Depression screen PHQ 2/9  Depression screen [facility] 2/9 08/26/2015 07/18/2015 02/14/2015 02/01/2015 12/17/2014 11/22/2014 07/01/2014  Decreased Interest 2 0 2 0 3 3 2   Down, Depressed, Hopeless 1 0 3 0 3 3 2   PHQ - 2 Score 3 0 5 0 6 6 4   Altered sleeping - - 2 - - - 3  Tired, decreased energy - - 3 - - - 3  Change in appetite - - 1 - - - 2  Feeling bad or failure about yourself  - - 3 - - - 3  Trouble concentrating - - 1 - - -  1  Moving slowly or fidgety/restless - - 0 - - - 0  Suicidal thoughts - - 1 - - - 1  PHQ-9 Score - - 16 - - - 17  Difficult doing work/chores - - Very difficult - - - Extremely dIfficult       Review of Systems  All other systems reviewed and are negative.      Objective:   Physical Exam  Constitutional: She is oriented to person, place, and time. She appears well-developed and well-nourished.  HENT:  Head: Normocephalic and atraumatic.  Neck: Normal range of motion. Neck supple.  Cardiovascular: Normal rate and regular rhythm.   Pulmonary/Chest: Effort normal and breath sounds normal.  Musculoskeletal:  Normal Muscle Bulk and Muscle Testing Reveals: Upper Extremities: Full ROM and Muscle  Strength 5/5 Lower Extremities: Full ROM and Muscle Strength 5/5 Bilateral Lower Extremities Flexion Produces Pain into Lower Extremities and Bilateral Feet Arises from chair slowly Antalgic Gait transferred to wheelchair Escorted to lobby via wheelchair with staff  Neurological: She is alert and oriented to person, place, and time.  Skin: Skin is warm and dry.  Psychiatric: She has a normal mood and affect.  Nursing note and vitals reviewed.         Assessment & Plan:  1. Severe peripheral polyneuropathy of undetermined etiology.:  Refilled: MS Contin 30 mg one tablet three times a day#90 and MSIR 15 mg one- two tablet's daily as needed # 60. Second script given to accommodate scheduled appointment. We will continue the opioid monitoring program, this consists of regular clinic visits, examinations, urine drug screen, pill counts as well as use of [place] Controlled Substance Reporting System. 2. Bilateral progressive upper extremity hand pain. Continue with current medication regime. Tight Glucose Control. Adhere to healthy diet regime. 3. Muscle Spasms: Continue Zanaflex.   4. Depression/Anxiety: Continue Klonopin. Waiting on Psychiatry appointment.  20 minutes of face to face patient care time spent during this visit. All questions were encouraged and answered.   F/U in 1 month

## 2015-09-01 ENCOUNTER — Other Ambulatory Visit: Payer: Self-pay | Admitting: Family Medicine

## 2015-09-02 LAB — 6-ACETYLMORPHINE,TOXASSURE ADD
6-ACETYLMORPHINE: NEGATIVE
6-acetylmorphine: NOT DETECTED ng/mg creat

## 2015-09-02 LAB — TOXASSURE SELECT,+ANTIDEPR,UR: PDF: 0

## 2015-09-14 NOTE — Progress Notes (Signed)
Urine drug screen for this encounter is consistent for prescribed medication 

## 2015-09-15 ENCOUNTER — Other Ambulatory Visit: Payer: Self-pay | Admitting: Family Medicine

## 2015-09-15 NOTE — Telephone Encounter (Signed)
Will forward to MD to advise. Esther Bradstreet,CMA  

## 2015-09-16 ENCOUNTER — Other Ambulatory Visit: Payer: Self-pay | Admitting: Registered Nurse

## 2015-09-27 ENCOUNTER — Other Ambulatory Visit: Payer: Self-pay | Admitting: Physical Medicine & Rehabilitation

## 2015-09-28 ENCOUNTER — Other Ambulatory Visit (INDEPENDENT_AMBULATORY_CARE_PROVIDER_SITE_OTHER): Payer: Medicaid Other

## 2015-09-28 ENCOUNTER — Encounter: Payer: Self-pay | Admitting: Gastroenterology

## 2015-09-28 ENCOUNTER — Ambulatory Visit (INDEPENDENT_AMBULATORY_CARE_PROVIDER_SITE_OTHER): Payer: Medicaid Other | Admitting: Gastroenterology

## 2015-09-28 VITALS — BP 134/84 | HR 88 | Ht 72.0 in | Wt 293.0 lb

## 2015-09-28 DIAGNOSIS — K529 Noninfective gastroenteritis and colitis, unspecified: Secondary | ICD-10-CM

## 2015-09-28 DIAGNOSIS — R140 Abdominal distension (gaseous): Secondary | ICD-10-CM

## 2015-09-28 DIAGNOSIS — R740 Nonspecific elevation of levels of transaminase and lactic acid dehydrogenase [LDH]: Secondary | ICD-10-CM

## 2015-09-28 DIAGNOSIS — R194 Change in bowel habit: Secondary | ICD-10-CM

## 2015-09-28 DIAGNOSIS — R7401 Elevation of levels of liver transaminase levels: Secondary | ICD-10-CM

## 2015-09-28 LAB — HEPATIC FUNCTION PANEL
ALBUMIN: 4.2 g/dL (ref 3.5–5.2)
ALT: 23 U/L (ref 0–35)
AST: 20 U/L (ref 0–37)
Alkaline Phosphatase: 38 U/L — ABNORMAL LOW (ref 39–117)
BILIRUBIN DIRECT: 0 mg/dL (ref 0.0–0.3)
TOTAL PROTEIN: 7.4 g/dL (ref 6.0–8.3)
Total Bilirubin: 0.3 mg/dL (ref 0.2–1.2)

## 2015-09-28 LAB — IGA: IgA: 312 mg/dL (ref 68–378)

## 2015-09-28 MED ORDER — DICYCLOMINE HCL 10 MG PO CAPS: 10.0000 mg | ORAL_CAPSULE | Freq: Three times a day (TID) | ORAL | Status: DC | PRN

## 2015-09-28 MED ORDER — RIFAXIMIN 550 MG PO TABS: 550.0000 mg | ORAL_TABLET | Freq: Three times a day (TID) | ORAL | Status: DC

## 2015-09-28 NOTE — Patient Instructions (Signed)
Your physician has requested that you go to the basement for lab work before leaving today  We have sent the following medications to your pharmacy for you to pick up at your convenience:  Bentyl, [phone]

## 2015-09-28 NOTE — Progress Notes (Signed)
HPI :  45 y/o female here for evaluation for chronic diarrhea with abdominal pains. New patient to our clinic. She has a history of DM and polyneuropathy with chronic pain for which she takes chronic narcotics,   Patient reports ongoing symptoms for several years, since she was a child. She has chronic loose stools, usually after she eats she has the urge to have a bowel movement. She otherwise has rare constipation. She reports roughly 3-4 BMs when having loose stools, and then other times she won't have a bowel movement for a few days but this is less common. Previously it was mostly just loose and watery stools. She has not had any blood in the stools. She has bloating and cramping with her bowels that bother her, as well as sharp pains in her lower abdomen. She feels this fairly frequently. A bowel movement will reliably relieve her discomfort. She has some ongoing nausea, but no vomiting. She thinks no significant change in weight in the past year. She has had some urgency but no incontinence episodes. She has had tried immodium in the past for which helps slow her down a little.   She has severe polyneuropathy and chronic pain for which she takes narcotics pain. She takes morphine 30mg  TID with breakthrough PRN. On average roughly 120mg  total per day. Most of her pain is in her extremities. She has had diarrhea despite chronic narcotic use.   Grandmother had colon cancer had colon cancer, she thinks diagnosed in her 54-70s or so. Brother had colon polyps.   She has had a prior EGD, colonoscopy remotely in 2001. Colonoscopy pathology is available, not report, with random biopsies obtained showing no evidence of microscopic colitis.   She was seen with Dr. Oletta Lamas at [place] previously who had recommended EGD and colonoscopy last year but she did not follow through with it. She has a sleep study pending prior to having anesthesia done.   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  She has a history of cholecystectomy in 2001.  Past Medical History  Diagnosis Date  . 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 DEPRESSION/ANXIETY 08/08/2009  . IBS 02/02/2010  . Polyneuropathy ([place]) 12/04/2010  . Renal stones      Past Surgical History  Procedure Laterality Date  . Cholecystectomy    . Abdominoplasty     Family History  Problem Relation Age of Onset  . Stroke Maternal Grandmother   . Colon cancer Maternal Grandmother   . Heart Problems Paternal Grandmother   . Dementia Paternal Grandmother   . Heart attack Paternal Grandfather    Social History  Substance Use Topics  . Smoking status: Never Smoker   . Smokeless tobacco: Never Used  . Alcohol Use: No   Current Outpatient Prescriptions  Medication Sig Dispense Refill  . aspirin 81 MG tablet Take 81 mg by mouth daily.    Marland Kitchen b complex vitamins capsule Take 1 capsule by mouth daily.    . clonazePAM (KLONOPIN) 0.5 MG tablet TAKE 3 TABLETS BY MOUTH AT BEDTIME 90 tablet 2  . enalapril (VASOTEC) 20 MG tablet TAKE 1 TABLET (20 MG TOTAL) BY MOUTH DAILY. 90 tablet 0  . glipiZIDE (GLUCOTROL) 10 MG tablet TAKE 1 TABLET BY MOUTH TWICE DAILY WITH MEALS  180 tablet 1  . metFORMIN (GLUCOPHAGE) 1000 MG tablet TAKE 1 TABLET BY MOUTH TWICE DAILY WITH MEALS 180 tablet 0  . morphine (MS CONTIN) 30 MG 12 hr tablet Take 1 tablet (30 mg total) by mouth 3 (three) times daily. 90 tablet 0  . morphine (MSIR) 15 MG tablet Take 1- 2 Tablets  Daily  as needed for severe pain ( 15- 30 mg) 60 tablet 0  . PARoxetine (PAXIL) 40 MG tablet TAKE 1 AND 1/2 TABLETS BY MOUTH EVERY DAY 45 tablet 2  . pravastatin (PRAVACHOL) 40 MG tablet TAKE 1 TABLET BY MOUTH EVERY EVENING 90 tablet 3  . tiZANidine (ZANAFLEX) 2 MG tablet TAKE 1 TABLET BY MOUTH THREE  TIMES DAILY 270 tablet 0  . dicyclomine (BENTYL) 10 MG capsule Take 1 capsule (10 mg total) by mouth every 8 (eight) hours as needed for spasms. 30 capsule 3  . rifaximin (XIFAXAN) 550 MG TABS tablet Take 1 tablet (550 mg total) by mouth 3 (three) times daily. 42 tablet 0   No current facility-administered medications for this visit.   Allergies  Allergen Reactions  . Codeine     confusion  . Nucynta [Tapentadol] Other (See Comments)    headaches  . Vancomycin     Red Man's syndrome     Review of Systems: All systems reviewed and negative except where noted in HPI.   Lab Results  Component Value Date   WBC 9.9 06/06/2015   HGB 14.0 06/06/2015   HCT 43.0 06/06/2015   MCV 85.3 06/06/2015   PLT 299 06/06/2015    Lab Results  Component Value Date   CREATININE 0.49 06/06/2015   BUN 9 06/06/2015   NA 142 06/06/2015   K 4.1 06/06/2015   CL 102 06/06/2015   CO2 23 06/06/2015    Lab Results  Component Value Date   ALT 34 06/06/2015   AST 49* 06/06/2015   ALKPHOS 42 06/06/2015   BILITOT 0.4 06/06/2015     Physical Exam: BP 134/84 mmHg  Pulse 88  Ht 6' (1.829 m)  Wt 293 lb (132.904 kg)  BMI 39.73 kg/m2 Constitutional: Pleasant,well-developed, female in no acute distress. HEENT: Normocephalic and atraumatic. Conjunctivae are normal. No scleral icterus. Neck supple.  Cardiovascular: Normal rate, regular rhythm.  Pulmonary/chest: Effort normal and breath sounds normal. No wheezing, rales or rhonchi.  Abdominal: Soft, obese / protuberant, nontender. Bowel sounds active throughout. There are no masses palpable. No hepatomegaly. Extremities: no edema Lymphadenopathy: No cervical adenopathy noted. Neurological: Alert and oriented to person place and time. Skin: Skin is warm and dry. No rashes noted. Psychiatric: Normal mood and affect. Behavior is normal.   ASSESSMENT AND PLAN: 45 y/o female with history as above, presenting for the following issues:  Altered bowel  habits / bloating: I suspect she has IBS, perhaps a component of narcotic bowel as well. We discussed both of these entities. Symptoms longstanding, colonoscopy in 2001 showed no evidence of IBD or microscopic colitis but she has not had this evaluated since that time. I will check fecal calprotectin to ensure normal and celiac labs to ensure normal. I counseled her I don't think she needs a colonoscopy at this time but will await fecal calprotectin and her course. I recommend treating her for IBS and see how she does. I discussed a low FODMOP diet with her and provided her a handout about this. I will otherwise give her bentyl to use PRN for cramps, and will try a 2 week  course of Rifaximin to see if this helps. If her symptoms persist she should let us know, we will otherwise let her know results of labs. Long term, managing her chronic pain with other modalities than narcotics may help improve her bowel function and pain.   Elevated AST - one time elevation, will repeat to see if this persists, and if so, pursue further workup with labs and imaging. She agreed.   Cheyenne Cellar, MD [facility] Pager [phone]  CC: Olam Idler, MD

## 2015-09-29 ENCOUNTER — Telehealth: Payer: Self-pay | Admitting: Gastroenterology

## 2015-09-29 LAB — TISSUE TRANSGLUTAMINASE, IGA: Tissue Transglutaminase Ab, IgA: 1 U/mL (ref <4)

## 2015-09-29 NOTE — Telephone Encounter (Signed)
Faxed notes and insurance card as requested

## 2015-10-04 ENCOUNTER — Ambulatory Visit (HOSPITAL_BASED_OUTPATIENT_CLINIC_OR_DEPARTMENT_OTHER): Payer: Medicaid Other | Admitting: Physical Medicine & Rehabilitation

## 2015-10-04 ENCOUNTER — Encounter: Payer: Medicaid Other | Attending: Physical Medicine & Rehabilitation

## 2015-10-04 ENCOUNTER — Encounter: Payer: Self-pay | Admitting: Physical Medicine & Rehabilitation

## 2015-10-04 VITALS — BP 131/85 | HR 91 | Resp 16

## 2015-10-04 DIAGNOSIS — M25561 Pain in right knee: Secondary | ICD-10-CM | POA: Insufficient documentation

## 2015-10-04 DIAGNOSIS — M25562 Pain in left knee: Secondary | ICD-10-CM | POA: Insufficient documentation

## 2015-10-04 DIAGNOSIS — G894 Chronic pain syndrome: Secondary | ICD-10-CM | POA: Insufficient documentation

## 2015-10-04 DIAGNOSIS — Z5181 Encounter for therapeutic drug level monitoring: Secondary | ICD-10-CM | POA: Diagnosis present

## 2015-10-04 DIAGNOSIS — Z79899 Other long term (current) drug therapy: Secondary | ICD-10-CM | POA: Insufficient documentation

## 2015-10-04 DIAGNOSIS — G629 Polyneuropathy, unspecified: Secondary | ICD-10-CM | POA: Diagnosis not present

## 2015-10-04 MED ORDER — MORPHINE SULFATE ER 30 MG PO TBCR: 30.0000 mg | EXTENDED_RELEASE_TABLET | Freq: Three times a day (TID) | ORAL | Status: DC

## 2015-10-04 MED ORDER — MORPHINE SULFATE 15 MG PO TABS: ORAL_TABLET | ORAL | Status: DC

## 2015-10-04 NOTE — Patient Instructions (Signed)
Biow[employer] trial Need to check insurance

## 2015-10-04 NOTE — Progress Notes (Signed)
Subjective:    Patient ID: [name], female    DOB: 26-Nov-1970, 45 y.o.   MRN: [id number]  HPI     45 year old female with long history of peripheral polyneuropathy. She is only diagnosed with diabetes several years later. She's had workup for her polyneuropathy without causes other than diabetes. Her hemoglobin A1c has been creeping up lately. She has tried several treatments and medications for her chronic lower extremity pain. She has started to develop hand pain as well.  Interval history, seen by gastroenterology and diagnosed with irritable bowel syndrome  Has previously tried neurontin, amitriptyline, topamax, methadone x 10 years, oxycodone, physical therapy ([facility] about 3 years ago-anadyne? Therapy). Currently on MS contin with moderate relief. She feels like the breakthrough medication is not helping as long as it used to. She takes the MS Contin on a 3 times a day basis. She has also tried Nucynta this gave her a headache. We discussed her high-dose regimen and the increased risk of addiction, side effects, overdose. Also this risk is compounded by being on Klonopin. Pain Inventory Average Pain 10 Pain Right Now 10 My pain is constant, sharp, burning, dull, stabbing, tingling and aching  In the last 24 hours, has pain interfered with the following? General activity 10 Relation with others 10 Enjoyment of life 10 What TIME of day is your pain at its worst? morning, evening  Sleep (in general) Poor  Pain is worse with: walking, bending, sitting, standing and some activites Pain improves with: rest and medication Relief from Meds: 6  Mobility walk without assistance walk with assistance use a cane how many minutes can you walk? 5 ability to climb steps?  no do you drive?  no needs help with transfers transfers alone Do you have any goals in this area?  yes  Function disabled: date disabled . I need assistance with the following:  bathing, meal  prep, household duties and shopping Do you have any goals in this area?  yes  Neuro/Psych weakness numbness tingling trouble walking spasms dizziness depression anxiety  Prior Studies Any changes since last visit?  no  Physicians involved in your care Any changes since last visit?  no   Family History  Problem Relation Age of Onset  . Stroke Maternal Grandmother   . Colon cancer Maternal Grandmother   . Heart Problems Paternal Grandmother   . Dementia Paternal Grandmother   . Heart attack Paternal Grandfather    Social History   Social History  . Marital Status: Legally Separated    Spouse Name: N/A  . Number of Children: N/A  . Years of Education: N/A   Occupational History  . Disabled    Social History Main Topics  . Smoking status: Never Smoker   . Smokeless tobacco: Never Used  . Alcohol Use: No  . Drug Use: No     Comment: rx opiods  . Sexual Activity: Not Asked   Other Topics Concern  . None   Social History Narrative   Past Surgical History  Procedure Laterality Date  . Cholecystectomy    . Abdominoplasty     Past Medical History  Diagnosis Date  . 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 DEPRESSION/ANXIETY 08/08/2009  . IBS 02/02/2010  . Polyneuropathy ([place]) 12/04/2010  . Renal stones    BP 131/85 mmHg  Pulse 91  Resp 16  SpO2 96%  Opioid Risk Score:   Fall Risk  Score:  `1  Depression screen PHQ 2/9  Depression screen [facility] 2/9 08/26/2015 07/18/2015 02/14/2015 02/01/2015 12/17/2014 11/22/2014 07/01/2014  Decreased Interest 2 0 2 0 3 3 2   Down, Depressed, Hopeless 1 0 3 0 3 3 2   PHQ - 2 Score 3 0 5 0 6 6 4   Altered sleeping - - 2 - - - 3  Tired, decreased energy - - 3 - - - 3  Change in appetite - - 1 - - - 2  Feeling bad or failure about yourself  - - 3 - - - 3  Trouble concentrating - - 1 - - - 1  Moving slowly or fidgety/restless - - 0 - - - 0  Suicidal  thoughts - - 1 - - - 1  PHQ-9 Score - - 16 - - - 17  Difficult doing work/chores - - Very difficult - - - Extremely dIfficult     Review of Systems  HENT: Negative.   Eyes: Negative.   Respiratory: Negative.   Cardiovascular: Negative.   Gastrointestinal: Negative.   Endocrine: Negative.   Genitourinary: Negative.   Musculoskeletal: Positive for gait problem.       Spasms  Allergic/Immunologic: Negative.   Neurological: Positive for dizziness, weakness and numbness.       Tingling  Hematological: Negative.   Psychiatric/Behavioral: Positive for dysphoric mood. The patient is nervous/anxious.   All other systems reviewed and are negative.      Objective:   Physical Exam  Constitutional: She is oriented to person, place, and time. She appears well-developed and well-nourished.  HENT:  Head: Normocephalic and atraumatic.  Eyes: Conjunctivae and EOM are normal. Pupils are equal, round, and reactive to light.  Neck: Normal range of motion.  Neurological: She is alert and oriented to person, place, and time.  Psychiatric: She has a normal mood and affect.  Nursing note and vitals reviewed.  Gait is without evidence of toe drag or knee instability. There is no lower extremity swelling.       Assessment & Plan:  1. Peripheral polyneuropathy with chronic lower extremity pain. We spent the majority of her visit discussing treatment options. We discussed her narcotic analgesics and I do not favor going up on any of her total dosage. Could consider switching her from MS Contin 30 mg 3 times a day and MSIR 15 twice a day to MS Contin 30 mg 4 times a day. This is somewhat atypical for a patient to only get about 6 hours of relief from MS Contin.  For now we will keep her regimen unchanged. Would repeat UDS with any change in dosage   We specifically discussed spinal cord stimulation which is somewhat controversial with peripheral neuropathy. We discussed peripheral nerve  stimulation which is available in several newer technologies. I do think she would be a good candidate for this. Overall we are trying to avoid any escalation in her opioid dose given that she is on high dose. She has tried numerous other adjunct medications without much relief. I do not think epidural injections would be of much use. Potentially tibial nerve blocks could be helpful but would not anticipate any long-term relief with these.  We'll get preauthorization for PENS. Nurse practitioner visit one month  Over half of the 25 min visit was spent counseling and coordinating care.

## 2015-10-20 ENCOUNTER — Other Ambulatory Visit: Payer: Self-pay | Admitting: Obstetrics

## 2015-10-20 DIAGNOSIS — B3731 Acute candidiasis of vulva and vagina: Secondary | ICD-10-CM

## 2015-10-20 DIAGNOSIS — B373 Candidiasis of vulva and vagina: Secondary | ICD-10-CM

## 2015-10-21 NOTE — Telephone Encounter (Signed)
Patient is requesting Diflucan refill- she is diabetic. Rx request approved

## 2015-10-24 ENCOUNTER — Encounter: Payer: Self-pay | Admitting: Neurology

## 2015-10-24 ENCOUNTER — Other Ambulatory Visit (INDEPENDENT_AMBULATORY_CARE_PROVIDER_SITE_OTHER): Payer: Medicaid Other

## 2015-10-24 ENCOUNTER — Ambulatory Visit (INDEPENDENT_AMBULATORY_CARE_PROVIDER_SITE_OTHER): Payer: Medicaid Other | Admitting: Neurology

## 2015-10-24 VITALS — BP 126/68 | HR 86 | Ht 72.0 in | Wt 294.0 lb

## 2015-10-24 DIAGNOSIS — E119 Type 2 diabetes mellitus without complications: Secondary | ICD-10-CM

## 2015-10-24 DIAGNOSIS — G603 Idiopathic progressive neuropathy: Secondary | ICD-10-CM

## 2015-10-24 DIAGNOSIS — Z794 Long term (current) use of insulin: Secondary | ICD-10-CM

## 2015-10-24 LAB — FOLATE: Folate: >23.7 ng/mL (ref >5.9)

## 2015-10-24 NOTE — Patient Instructions (Signed)
We will screen for common causes of polyneuropathy, such as ANA, Sed Rate, SPEP/IFE, ACE and folate.  We will repeat NCV-EMG to evaluate progression of peripheral neuropathy  Try to work on getting diabetes under control  Follow up after testing.

## 2015-10-24 NOTE — Progress Notes (Signed)
NEUROLOGY CONSULTATION NOTE  [name] MRN: [id number] DOB: 04/11/1970  Referring provider: Danella Sensing, NP Primary care provider: Olene Floss, MD  Reason for consult:  neuropathy  HISTORY OF PRESENT ILLNESS: [name] is a 45 year old right-handed woman with type 2 diabetes, idiopathic polyneuropathy, chronic pain syndrome, and dysthymic disorder who presents for loss of coordination.  History obtained by patient and PM&R notes.  She began experiencing symptoms of neuropathy around 2000.  At first, she experienced numbness and tingling in the feet.  It subsequently progressed to stabbing pain in the feet.  The pain and numbness has since spread to just below the knees and includes the hands as well..  She had a NCV-EMG of the upper extremities performed on 10/08/13 to evaluate worsening hand numbness and spasms.  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   Hgb A1c from 07/18/15 was 8.2.   Prior labs include HIV negative, RPR nonreactive, Sed Rate 18, B12 900, folate >1348, TSH 3.206.  She also reports Lyme and Celiac panel were negative.  She has remote history of gestational diabetes, so she has been monitored for diabetes for several years.  About a year after she was diagnosed with neuropathy, she was diagnosed with type 2 diabetes.  Diabetes had been controlled for many years with A1c in the 5 range, until a year ago, in which A1c was 8.1.   As per the chart, she had a remote head CT on 11/24/98 for left sided numbness, which was "unremarkable".  She has tried gabapentin, Lyrica and amitriptyline for pain.  She currently sees a pain specialist and takes morphine.  She is hesitant about taking Cymbalta because she doesn't want to discontinue her Paxil. She ambulates with cane.  PAST MEDICAL HISTORY: Past Medical History:  Diagnosis Date  . DEPRESSION/ANXIETY  08/08/2009  . IBS 02/02/2010  . Polyneuropathy ([place]) 12/04/2010  . Renal stones   . 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 as uncontrolled 08/08/2009    PAST SURGICAL HISTORY: Past Surgical History:  Procedure Laterality Date  . ABDOMINOPLASTY    . CHOLECYSTECTOMY      MEDICATIONS: Current Outpatient Prescriptions on File Prior to Visit  Medication Sig Dispense Refill  . aspirin 81 MG tablet Take 81 mg by mouth daily.    Marland Kitchen b complex vitamins capsule Take 1 capsule by mouth daily.    . clonazePAM (KLONOPIN) 0.5 MG tablet TAKE 3 TABLETS BY MOUTH AT BEDTIME 90 tablet 2  . dicyclomine (BENTYL) 10 MG capsule Take 1 capsule (10 mg total) by mouth every 8 (eight) hours as needed for spasms. 30 capsule 3  . enalapril (VASOTEC) 20 MG tablet TAKE 1 TABLET (20 MG TOTAL) BY MOUTH DAILY. 90 tablet 0  . fluconazole (DIFLUCAN) 150 MG tablet TAKE 1 TABLET BY MOUTH EVERY OTHER DAY 2 tablet 0  . glipiZIDE (GLUCOTROL) 10 MG tablet TAKE 1 TABLET BY MOUTH TWICE DAILY WITH MEALS 180 tablet 1  . metFORMIN (GLUCOPHAGE) 1000 MG tablet TAKE 1 TABLET BY MOUTH TWICE DAILY WITH MEALS 180 tablet 0  . morphine (MS CONTIN) 30 MG 12 hr tablet Take 1 tablet (30 mg total) by mouth 3 (three) times daily. 90 tablet 0  . morphine (MSIR) 15 MG tablet Take 1- 2 Tablets  Daily  as needed for severe pain ( 15- 30 mg) 60 tablet 0  . PARoxetine (PAXIL) 40 MG  tablet TAKE 1 AND 1/2 TABLETS BY MOUTH EVERY DAY 45 tablet 2  . pravastatin (PRAVACHOL) 40 MG tablet TAKE 1 TABLET BY MOUTH EVERY EVENING 90 tablet 3  . tiZANidine (ZANAFLEX) 2 MG tablet TAKE 1 TABLET BY MOUTH THREE TIMES DAILY 270 tablet 0  . rifaximin (XIFAXAN) 550 MG TABS tablet Take 1 tablet (550 mg total) by mouth 3 (three) times daily. 42 tablet 0   No current facility-administered medications on file prior to visit.     ALLERGIES: Allergies  Allergen Reactions  . Codeine     confusion  . Nucynta [Tapentadol]  Other (See Comments)    headaches  . Vancomycin     Red Man's syndrome    FAMILY HISTORY: Family History  Problem Relation Age of Onset  . Stroke Maternal Grandmother   . Colon cancer Maternal Grandmother   . Heart Problems Paternal Grandmother   . Dementia Paternal Grandmother   . Heart attack Paternal Grandfather     SOCIAL HISTORY: Social History   Social History  . Marital status: Legally Separated    Spouse name: N/A  . Number of children: N/A  . Years of education: N/A   Occupational History  . Disabled    Social History Main Topics  . Smoking status: Never Smoker  . Smokeless tobacco: Never Used  . Alcohol use No  . Drug use: No     Comment: rx opiods  . Sexual activity: Not on file   Other Topics Concern  . Not on file   Social History Narrative  . No narrative on file    REVIEW OF SYSTEMS: Constitutional: No fevers, chills, or sweats, no generalized fatigue, change in appetite Eyes: No visual changes, double vision, eye pain Ear, nose and throat: No hearing loss, ear pain, nasal congestion, sore throat Cardiovascular: No chest pain, palpitations Respiratory:  No shortness of breath at rest or with exertion, wheezes GastrointestinaI: No nausea, vomiting, diarrhea, abdominal pain, fecal incontinence Genitourinary:  No dysuria, urinary retention or frequency Musculoskeletal:  No neck pain, back pain Integumentary: No rash, pruritus, skin lesions Neurological: as above Psychiatric: No depression, insomnia, anxiety Endocrine: No palpitations, fatigue, diaphoresis, mood swings, change in appetite, change in weight, increased thirst Hematologic/Lymphatic:  No purpura, petechiae. Allergic/Immunologic: no itchy/runny eyes, nasal congestion, recent allergic reactions, rashes  PHYSICAL EXAM: Vitals:   10/24/15 1459  BP: 126/68  Pulse: 86   General: No acute distress.  Patient appears well-groomed.  Head:  Normocephalic/atraumatic Eyes:  fundi examined  but not visualized Neck: supple, no paraspinal tenderness, full range of motion Back: No paraspinal tenderness Heart: regular rate and rhythm Lungs: Clear to auscultation bilaterally. Vascular: No carotid bruits. Neurological Exam: Mental status: alert and oriented to person, place, and time, recent and remote memory intact, fund of knowledge intact, attention and concentration intact, speech fluent and not dysarthric, language intact. Cranial nerves: CN I: not tested CN II: pupils equal, round and reactive to light, visual fields intact CN III, IV, VI:  full range of motion, no nystagmus, no ptosis CN V: facial sensation intact CN VII: upper and lower face symmetric CN VIII: hearing intact CN IX, X: gag intact, uvula midline CN XI: sternocleidomastoid and trapezius muscles intact CN XII: tongue midline Bulk & Tone: normal, no fasciculations. Motor:  5/5 throughout  Sensation:  Decreased pinprick sensation in hands up to wrists and feet up to below knees.  Decreased vibration sensation in toes and ankles.  Proprioception loss in feet. Deep Tendon Reflexes:  2+ throughout, except absent in ankles, toes downgoing.  Finger to nose testing:  Without dysmetria.  Heel to shin:  Without dysmetria.  Gait:  Ataxic.  Able to turn but unable to tandem walk. Romberg positive.  IMPRESSION: Idiopathic polyneuropathy Type 2 diabetes  PLAN: 1.  Will recheck NCV-EMG to evaluate progression and severity 2.  Will check ANA, Sed Rate, SPEP/IFE, folate 3.  Follow up after testing  Thank you for allowing me to take part in the care of this patient.  Metta Clines, DO  CC:  Olene Floss, MD  Danella Sensing, NP

## 2015-10-24 NOTE — Progress Notes (Signed)
Chart forwarded.  

## 2015-10-25 LAB — ANA: Anti Nuclear Antibody(ANA): NEGATIVE

## 2015-10-25 LAB — ANGIOTENSIN CONVERTING ENZYME: Angiotensin-Converting Enzyme: 10 U/L (ref 8–52)

## 2015-10-26 ENCOUNTER — Other Ambulatory Visit: Payer: Self-pay | Admitting: Family Medicine

## 2015-10-26 ENCOUNTER — Telehealth: Payer: Self-pay | Admitting: Gastroenterology

## 2015-10-26 LAB — PROTEIN ELECTROPHORESIS, SERUM
ALBUMIN ELP: 3.9 g/dL (ref 3.8–4.8)
ALPHA-2-GLOBULIN: 0.8 g/dL (ref 0.5–0.9)
Alpha-1-Globulin: 0.3 g/dL (ref 0.2–0.3)
BETA 2: 0.5 g/dL (ref 0.2–0.5)
BETA GLOBULIN: 0.6 g/dL (ref 0.4–0.6)
Gamma Globulin: 0.9 g/dL (ref 0.8–1.7)
Total Protein, Serum Electrophoresis: 6.9 g/dL (ref 6.1–8.1)

## 2015-10-26 LAB — IMMUNOFIXATION ELECTROPHORESIS
IGM, SERUM: 79 mg/dL (ref 48–271)
IgA: 303 mg/dL (ref 81–463)
IgG (Immunoglobin G), Serum: 923 mg/dL (ref 694–1618)

## 2015-10-27 ENCOUNTER — Other Ambulatory Visit: Payer: Self-pay | Admitting: Internal Medicine

## 2015-10-28 ENCOUNTER — Telehealth: Payer: Self-pay

## 2015-10-28 NOTE — Telephone Encounter (Signed)
-----   Message from Pieter Partridge, DO sent at 10/26/2015  6:44 PM EDT ----- Labs are normal

## 2015-10-28 NOTE — Telephone Encounter (Signed)
Sent via mychart

## 2015-10-31 ENCOUNTER — Other Ambulatory Visit: Payer: Self-pay | Admitting: Internal Medicine

## 2015-10-31 MED ORDER — PAROXETINE HCL 40 MG PO TABS: 60.0000 mg | ORAL_TABLET | Freq: Every day | ORAL | 0 refills | Status: DC

## 2015-10-31 NOTE — Progress Notes (Signed)
Patient requested 90 day supply. Filled Rx.

## 2015-10-31 NOTE — Telephone Encounter (Signed)
Contacted pt. She states she has gotten messages from [facility]. She will call them back today.

## 2015-11-01 ENCOUNTER — Encounter: Payer: Medicaid Other | Admitting: Neurology

## 2015-11-03 ENCOUNTER — Other Ambulatory Visit: Payer: Self-pay | Admitting: Family Medicine

## 2015-11-04 ENCOUNTER — Encounter: Payer: Medicaid Other | Admitting: Registered Nurse

## 2015-11-08 ENCOUNTER — Encounter: Payer: Medicaid Other | Attending: Physical Medicine & Rehabilitation | Admitting: Registered Nurse

## 2015-11-08 ENCOUNTER — Encounter: Payer: Self-pay | Admitting: Registered Nurse

## 2015-11-08 VITALS — BP 130/83 | HR 94 | Temp 98.7°F | Resp 16

## 2015-11-08 DIAGNOSIS — G629 Polyneuropathy, unspecified: Secondary | ICD-10-CM | POA: Diagnosis not present

## 2015-11-08 DIAGNOSIS — Z79899 Other long term (current) drug therapy: Secondary | ICD-10-CM | POA: Diagnosis present

## 2015-11-08 DIAGNOSIS — Z5181 Encounter for therapeutic drug level monitoring: Secondary | ICD-10-CM

## 2015-11-08 DIAGNOSIS — F341 Dysthymic disorder: Secondary | ICD-10-CM | POA: Diagnosis not present

## 2015-11-08 DIAGNOSIS — G894 Chronic pain syndrome: Secondary | ICD-10-CM | POA: Diagnosis not present

## 2015-11-08 DIAGNOSIS — M25561 Pain in right knee: Secondary | ICD-10-CM | POA: Diagnosis present

## 2015-11-08 DIAGNOSIS — M25562 Pain in left knee: Secondary | ICD-10-CM | POA: Diagnosis present

## 2015-11-08 MED ORDER — MORPHINE SULFATE 15 MG PO TABS: ORAL_TABLET | ORAL | 0 refills | Status: DC

## 2015-11-08 MED ORDER — MORPHINE SULFATE ER 30 MG PO TBCR
30.0000 mg | EXTENDED_RELEASE_TABLET | Freq: Three times a day (TID) | ORAL | 0 refills | Status: DC
Start: 2015-11-08 — End: 2015-12-19

## 2015-11-08 NOTE — Progress Notes (Signed)
Subjective:    Patient ID: [name], female    DOB: Dec 01, 1970, 45 y.o.   MRN: [id number]  HPI: [name] is a 45 year old female who returns for follow up for chronic pain and medication refill. She states her pain is located in her bilateral hands, bilateral lower extremities and bilateral feet.She rates her pain 7.  Her current exercise regime is walking.  Pain Inventory Average Pain 8 Pain Right Now 7 My pain is constant, sharp, burning, dull, stabbing, tingling and aching  In the last 24 hours, has pain interfered with the following? General activity 10 Relation with others 9 Enjoyment of life 10 What TIME of day is your pain at its worst? morning Sleep (in general) Fair  Pain is worse with: walking, bending, sitting, standing and some activites Pain improves with: rest, medication and TENS Relief from Meds: 5  Mobility walk without assistance walk with assistance use a cane how many minutes can you walk? 5 ability to climb steps?  no do you drive?  no needs help with transfers transfers alone Do you have any goals in this area?  yes  Function disabled: date disabled . Do you have any goals in this area?  yes  Neuro/Psych No problems in this area  Prior Studies Any changes since last visit?  no  Physicians involved in your care Any changes since last visit?  no   Family History  Problem Relation Age of Onset  . Stroke Maternal Grandmother   . Colon cancer Maternal Grandmother   . Heart Problems Paternal Grandmother   . Dementia Paternal Grandmother   . Heart attack Paternal Grandfather    Social History   Social History  . Marital status: Legally Separated    Spouse name: N/A  . Number of children: N/A  . Years of education: N/A   Occupational History  . Disabled    Social History Main Topics  . Smoking status: Never Smoker  . Smokeless tobacco: Never Used  . Alcohol use No  . Drug use: No     Comment: rx opiods    . Sexual activity: Not Asked   Other Topics Concern  . None   Social History Narrative  . None   Past Surgical History:  Procedure Laterality Date  . ABDOMINOPLASTY    . CHOLECYSTECTOMY     Past Medical History:  Diagnosis Date  . DEPRESSION/ANXIETY 08/08/2009  . IBS 02/02/2010  . Polyneuropathy ([place]) 12/04/2010  . Renal stones   . 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 as uncontrolled 08/08/2009   BP 130/83 (BP Location: Right Arm, Patient Position: Sitting, Cuff Size: Large)   Pulse 94   Temp 98.7 F (37.1 C) (Oral)   Resp 16   SpO2 98%   Opioid Risk Score:   Fall Risk Score:  `1  Depression screen PHQ 2/9  Depression screen [facility] 2/9 08/26/2015 07/18/2015 02/14/2015 02/01/2015 12/17/2014 11/22/2014 07/01/2014  Decreased Interest 2 0 2 0 3 3 2   Down, Depressed, Hopeless 1 0 3 0 3 3 2   PHQ - 2 Score 3 0 5 0 6 6 4   Altered sleeping - - 2 - - - 3  Tired, decreased energy - - 3 - - - 3  Change in appetite - - 1 - - - 2  Feeling bad or failure about yourself  - - 3 - - - 3  Trouble concentrating - - 1 - - -  1  Moving slowly or fidgety/restless - - 0 - - - 0  Suicidal thoughts - - 1 - - - 1  PHQ-9 Score - - 16 - - - 17  Difficult doing work/chores - - Very difficult - - - Extremely dIfficult  Some recent data might be hidden    Review of Systems  Constitutional: Negative.   HENT: Negative.   Eyes: Negative.   Respiratory: Negative.   Cardiovascular: Negative.   Gastrointestinal: Negative.   Endocrine: Negative.   Genitourinary: Negative.   Allergic/Immunologic: Negative.   Neurological: Negative.   Hematological: Negative.   Psychiatric/Behavioral: Negative.   All other systems reviewed and are negative.      Objective:   Physical Exam  Constitutional: She is oriented to person, place, and time. She appears well-developed and well-nourished.  HENT:  Head: Normocephalic and atraumatic.  Neck: Normal range of  motion. Neck supple.  Cardiovascular: Normal rate and regular rhythm.   Pulmonary/Chest: Effort normal and breath sounds normal.  Musculoskeletal:  Normal Muscle Bulk and Muscle Testing Reveals: Upper Extremities: Full ROM and Muscle Strength 5/5 Lower Extremities: Full ROM and Muscle Strength 5/5 Arises from Table slowly Narrow Based Gait  Neurological: She is alert and oriented to person, place, and time.  Skin: Skin is warm and dry.  Psychiatric: She has a normal mood and affect.  Nursing note and vitals reviewed.         Assessment & Plan:  1. Severe peripheral polyneuropathy of undetermined etiology.:  Refilled: MS Contin 30 mg one tablet three times a day#90 and MSIR 15 mg one- two tablet's daily as needed # 60.  We will continue the opioid monitoring program, this consists of regular clinic visits, examinations, urine drug screen, pill counts as well as use of [place] Controlled Substance Reporting System. 2. Bilateral progressive upper extremity hand pain. Continue with current medication regime. Tight Glucose Control. Adhere to healthy diet regime. 3. Muscle Spasms: Continue Zanaflex.  4. Depression/Anxiety: Continue Paxil and  Klonopin.  20 minutes of face to face patient care time spent during this visit. All questions were encouraged and answered.   F/U in 1 month

## 2015-11-25 ENCOUNTER — Other Ambulatory Visit: Payer: Self-pay | Admitting: Internal Medicine

## 2015-11-25 MED ORDER — GLUCOSE BLOOD VI STRP: ORAL_STRIP | 3 refills | Status: DC

## 2015-11-25 NOTE — Telephone Encounter (Signed)
Needs refill on diabetic test strips.  [facility] on [address].  If she is ready for a new meter, she would like to get one.

## 2015-11-30 ENCOUNTER — Telehealth: Payer: Self-pay | Admitting: *Deleted

## 2015-11-30 NOTE — Telephone Encounter (Signed)
Submitted prior authorization to [facility] for MSContin 30 mg ER (TID) and MSIR 15mg  (BID). Does not exceed the 120 mg daily maximum

## 2015-12-01 ENCOUNTER — Other Ambulatory Visit: Payer: Self-pay | Admitting: Internal Medicine

## 2015-12-16 ENCOUNTER — Telehealth: Payer: Self-pay | Admitting: Physical Medicine & Rehabilitation

## 2015-12-16 MED ORDER — CLONAZEPAM 0.5 MG PO TABS: 1.5000 mg | ORAL_TABLET | Freq: Every day | ORAL | 0 refills | Status: DC

## 2015-12-16 NOTE — Telephone Encounter (Signed)
Patient needs a refill on Klonopin.  Please call patient when this is called into pharmacy.

## 2015-12-16 NOTE — Telephone Encounter (Signed)
rx phoned in, patient notified

## 2015-12-19 ENCOUNTER — Encounter: Payer: Medicaid Other | Attending: Physical Medicine & Rehabilitation | Admitting: Registered Nurse

## 2015-12-19 ENCOUNTER — Encounter: Payer: Self-pay | Admitting: Registered Nurse

## 2015-12-19 VITALS — BP 123/81 | HR 112 | Resp 16

## 2015-12-19 DIAGNOSIS — G629 Polyneuropathy, unspecified: Secondary | ICD-10-CM | POA: Diagnosis not present

## 2015-12-19 DIAGNOSIS — Z79899 Other long term (current) drug therapy: Secondary | ICD-10-CM | POA: Diagnosis present

## 2015-12-19 DIAGNOSIS — G894 Chronic pain syndrome: Secondary | ICD-10-CM

## 2015-12-19 DIAGNOSIS — M25562 Pain in left knee: Secondary | ICD-10-CM | POA: Diagnosis present

## 2015-12-19 DIAGNOSIS — Z5181 Encounter for therapeutic drug level monitoring: Secondary | ICD-10-CM

## 2015-12-19 DIAGNOSIS — F341 Dysthymic disorder: Secondary | ICD-10-CM | POA: Diagnosis not present

## 2015-12-19 DIAGNOSIS — M25561 Pain in right knee: Secondary | ICD-10-CM | POA: Insufficient documentation

## 2015-12-19 MED ORDER — MORPHINE SULFATE 15 MG PO TABS: ORAL_TABLET | ORAL | 0 refills | Status: DC

## 2015-12-19 MED ORDER — MORPHINE SULFATE ER 30 MG PO TBCR: 30.0000 mg | EXTENDED_RELEASE_TABLET | Freq: Three times a day (TID) | ORAL | 0 refills | Status: DC

## 2015-12-19 NOTE — Progress Notes (Signed)
Subjective:    Patient ID: [name], female    DOB: Jan 31, 1971, 45 y.o.   MRN: [id number]  HPI:  [name] is a 45 year old female who returns for follow up for chronic pain and medication refill. She states her pain is located in her bilateral hands, bilateral lower extremities and bilateral feet.She rates her pain 5. Her current exercise regime is walking.  Pain Inventory Average Pain 7 Pain Right Now 5 My pain is constant, sharp, burning, dull, stabbing, tingling and aching  In the last 24 hours, has pain interfered with the following? General activity 10 Relation with others 9 Enjoyment of life 9 What TIME of day is your pain at its worst? morning, evening  Sleep (in general) Poor  Pain is worse with: walking, bending and standing Pain improves with: rest and medication Relief from Meds: 6  Mobility walk without assistance walk with assistance use a cane how many minutes can you walk? 5 ability to climb steps?  no do you drive?  no Do you have any goals in this area?  yes  Function disabled: date disabled . Do you have any goals in this area?  no  Neuro/Psych weakness numbness tremor tingling trouble walking spasms dizziness depression anxiety  Prior Studies Any changes since last visit?  no  Physicians involved in your care Any changes since last visit?  no   Family History  Problem Relation Age of Onset  . Stroke Maternal Grandmother   . Colon cancer Maternal Grandmother   . Heart Problems Paternal Grandmother   . Dementia Paternal Grandmother   . Heart attack Paternal Grandfather    Social History   Social History  . Marital status: Legally Separated    Spouse name: N/A  . Number of children: N/A  . Years of education: N/A   Occupational History  . Disabled    Social History Main Topics  . Smoking status: Never Smoker  . Smokeless tobacco: Never Used  . Alcohol use No  . Drug use: No     Comment: rx  opiods  . Sexual activity: Not Asked   Other Topics Concern  . None   Social History Narrative  . None   Past Surgical History:  Procedure Laterality Date  . ABDOMINOPLASTY    . CHOLECYSTECTOMY     Past Medical History:  Diagnosis Date  . DEPRESSION/ANXIETY 08/08/2009  . IBS 02/02/2010  . Polyneuropathy ([place]) 12/04/2010  . Renal stones   . 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 as uncontrolled 08/08/2009   BP 123/81 (BP Location: Right Arm, Patient Position: Sitting, Cuff Size: Large)   Pulse (!) 112   Resp 16   SpO2 98%   Opioid Risk Score:   Fall Risk Score:  `1  Depression screen PHQ 2/9  Depression screen [facility] 2/9 08/26/2015 07/18/2015 02/14/2015 02/01/2015 12/17/2014 11/22/2014 07/01/2014  Decreased Interest 2 0 2 0 3 3 2   Down, Depressed, Hopeless 1 0 3 0 3 3 2   PHQ - 2 Score 3 0 5 0 6 6 4   Altered sleeping - - 2 - - - 3  Tired, decreased energy - - 3 - - - 3  Change in appetite - - 1 - - - 2  Feeling bad or failure about yourself  - - 3 - - - 3  Trouble concentrating - - 1 - - - 1  Moving slowly or fidgety/restless - - 0 - - -  0  Suicidal thoughts - - 1 - - - 1  PHQ-9 Score - - 16 - - - 17  Difficult doing work/chores - - Very difficult - - - Extremely dIfficult  Some recent data might be hidden    Review of Systems  HENT: Negative.   Respiratory: Negative.   Cardiovascular: Negative.   Gastrointestinal: Negative.   Genitourinary: Negative.   Musculoskeletal: Positive for arthralgias and gait problem.  Neurological: Positive for dizziness, tremors, weakness and numbness.       Tingling   Psychiatric/Behavioral: Positive for dysphoric mood. The patient is nervous/anxious.   All other systems reviewed and are negative.      Objective:   Physical Exam  Constitutional: She is oriented to person, place, and time. She appears well-developed and well-nourished.  HENT:  Head: Normocephalic and atraumatic.    Neck: Normal range of motion. Neck supple.  Cardiovascular: Normal rate and regular rhythm.   Pulmonary/Chest: Effort normal and breath sounds normal.  Musculoskeletal:  Normal Muscle Bulk and Muscle Testing Reveals: Upper Extremities: Full ROM and Muscle Strength 4/5 Lower Extremities: Full ROM and Muscle Strength 5/5 Arises from table with ease Narrow Based Gait  Neurological: She is alert and oriented to person, place, and time.  Skin: Skin is warm and dry.  Psychiatric: She has a normal mood and affect.  Nursing note and vitals reviewed.         Assessment & Plan:  1. Severe peripheral polyneuropathy of undetermined etiology.:  Refilled: MS Contin 30 mg one tablet three times a day#90 and MSIR 15 mg one- two tablet's daily as needed # 60. We will continue the opioid monitoring program, this consists of regular clinic visits, examinations, urine drug screen, pill counts as well as use of [place] Controlled Substance Reporting System. 2. Bilateral progressive upper extremity hand pain. Continue with current medication regime. Tight Glucose Control. Adhere to healthy diet regime. 3. Muscle Spasms: Continue Zanaflex.  4. Depression/Anxiety: Continue Paxil and  Klonopin.  20 minutes of face to face patient care time spent during this visit. All questions were encouraged and answered.   F/U in 1 month

## 2015-12-24 ENCOUNTER — Other Ambulatory Visit: Payer: Self-pay | Admitting: Physical Medicine & Rehabilitation

## 2015-12-29 ENCOUNTER — Other Ambulatory Visit: Payer: Self-pay | Admitting: Family Medicine

## 2016-01-17 ENCOUNTER — Encounter: Payer: Medicaid Other | Admitting: Registered Nurse

## 2016-01-18 ENCOUNTER — Telehealth: Payer: Self-pay | Admitting: Physical Medicine & Rehabilitation

## 2016-01-18 MED ORDER — CLONAZEPAM 0.5 MG PO TABS: 1.5000 mg | ORAL_TABLET | Freq: Every day | ORAL | 0 refills | Status: DC

## 2016-01-18 NOTE — Telephone Encounter (Signed)
Patient needs a refill on Klonopin called into her pharmacy.  Please let her know when this is done.

## 2016-01-18 NOTE — Telephone Encounter (Signed)
Pt's chart reviewed, rx phoned into pt's preferred pharmacy, pt notified

## 2016-01-23 ENCOUNTER — Ambulatory Visit (HOSPITAL_BASED_OUTPATIENT_CLINIC_OR_DEPARTMENT_OTHER): Payer: Medicaid Other | Admitting: Physical Medicine & Rehabilitation

## 2016-01-23 ENCOUNTER — Encounter: Payer: Medicaid Other | Attending: Physical Medicine & Rehabilitation

## 2016-01-23 ENCOUNTER — Encounter: Payer: Self-pay | Admitting: Physical Medicine & Rehabilitation

## 2016-01-23 VITALS — BP 130/87 | HR 96 | Resp 14

## 2016-01-23 DIAGNOSIS — M25561 Pain in right knee: Secondary | ICD-10-CM | POA: Diagnosis present

## 2016-01-23 DIAGNOSIS — Z79899 Other long term (current) drug therapy: Secondary | ICD-10-CM | POA: Insufficient documentation

## 2016-01-23 DIAGNOSIS — G894 Chronic pain syndrome: Secondary | ICD-10-CM | POA: Insufficient documentation

## 2016-01-23 DIAGNOSIS — R209 Unspecified disturbances of skin sensation: Secondary | ICD-10-CM

## 2016-01-23 DIAGNOSIS — E1140 Type 2 diabetes mellitus with diabetic neuropathy, unspecified: Secondary | ICD-10-CM | POA: Diagnosis not present

## 2016-01-23 DIAGNOSIS — Z5181 Encounter for therapeutic drug level monitoring: Secondary | ICD-10-CM | POA: Diagnosis not present

## 2016-01-23 DIAGNOSIS — M25562 Pain in left knee: Secondary | ICD-10-CM | POA: Diagnosis present

## 2016-01-23 MED ORDER — TAPENTADOL HCL ER 150 MG PO TB12: 150.0000 mg | ORAL_TABLET | Freq: Two times a day (BID) | ORAL | 0 refills | Status: DC

## 2016-01-23 MED ORDER — MORPHINE SULFATE 30 MG PO TABS: ORAL_TABLET | ORAL | 0 refills | Status: DC

## 2016-01-23 NOTE — Patient Instructions (Signed)
Will try Nucynta ER for 1 week. We'll have you come back and we will determine whether this will be continued or whether we will need to switch back to the MS Contin. For now, we'll keep the MSIR at 30 mg per day  Referral placed a vein and vascular surgery specialists to repeat lower extremity vascular studies

## 2016-01-23 NOTE — Progress Notes (Signed)
Subjective:    Patient ID: [name], female    DOB: 1970-08-19, 45 y.o.   MRN: [id number]  HPI Complaining of increasing hand pain. Has not had any trauma. Patient has a history of diabetic neuropathy. She's had some progression in terms of numbness as well as weakness. Over the course of several years. Also notes that her feet are very cold and toes turn bluish. She's had vascular surgery evaluation several years ago, which looked okay. Nonsmoker, diabetic control is worse than it has been. Hemoglobin A1c around 8. EMG 10/08/2013 was reviewed which showed sensorimotor neuropathy affecting both median and ulnar nerves. This was felt to likely be a progression of the polyneuropathy rather than compressive neuropathies.  Patient feet. Her feet are also cold and bluish. She has had prior arterial Dopplers at the vascular surgery office, which showed no evidence of lower extremity arterial stenosis. However, this was several years ago Patient is a nonsmoker Pain Inventory Average Pain 9 Pain Right Now 9 My pain is constant, sharp, burning, dull, stabbing, tingling and aching  In the last 24 hours, has pain interfered with the following? General activity 10 Relation with others 10 Enjoyment of life 10 What TIME of day is your pain at its worst? morning,evening Sleep (in general) Poor  Pain is worse with: walking, bending, sitting and standing Pain improves with: medication Relief from Meds: 5  Mobility walk without assistance walk with assistance use a cane how many minutes can you walk? 5 ability to climb steps?  no do you drive?  no Do you have any goals in this area?  yes  Function disabled: date disabled n/a I need assistance with the following:  meal prep, household duties and shopping Do you have any goals in this area?  yes  Neuro/Psych weakness numbness tremor tingling trouble walking spasms dizziness depression anxiety  Prior Studies Any changes  since last visit?  no  Physicians involved in your care Any changes since last visit?  no   Family History  Problem Relation Age of Onset  . Stroke Maternal Grandmother   . Colon cancer Maternal Grandmother   . Heart Problems Paternal Grandmother   . Dementia Paternal Grandmother   . Heart attack Paternal Grandfather    Social History   Social History  . Marital status: Legally Separated    Spouse name: N/A  . Number of children: N/A  . Years of education: N/A   Occupational History  . Disabled    Social History Main Topics  . Smoking status: Never Smoker  . Smokeless tobacco: Never Used  . Alcohol use No  . Drug use: No     Comment: rx opiods  . Sexual activity: Not Asked   Other Topics Concern  . None   Social History Narrative  . None   Past Surgical History:  Procedure Laterality Date  . ABDOMINOPLASTY    . CHOLECYSTECTOMY     Past Medical History:  Diagnosis Date  . DEPRESSION/ANXIETY 08/08/2009  . IBS 02/02/2010  . Polyneuropathy ([place]) 12/04/2010  . Renal stones   . 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) 08/08/2009   BP 130/87   Pulse 96   Resp 14   SpO2 96%   Opioid Risk Score:   Fall Risk Score:  `1  Depression screen PHQ 2/9  Depression screen [facility] 2/9 08/26/2015 07/18/2015 02/14/2015 02/01/2015 12/17/2014 11/22/2014 07/01/2014  Decreased Interest 2 0 2 0 3 3  2  Down, Depressed, Hopeless 1 0 3 0 3 3 2   PHQ - 2 Score 3 0 5 0 6 6 4   Altered sleeping - - 2 - - - 3  Tired, decreased energy - - 3 - - - 3  Change in appetite - - 1 - - - 2  Feeling bad or failure about yourself  - - 3 - - - 3  Trouble concentrating - - 1 - - - 1  Moving slowly or fidgety/restless - - 0 - - - 0  Suicidal thoughts - - 1 - - - 1  PHQ-9 Score - - 16 - - - 17  Difficult doing work/chores - - Very difficult - - - Extremely dIfficult  Some recent data might be hidden    Review of Systems    Constitutional: Positive for chills.       Night sweats  Gastrointestinal: Positive for abdominal pain, constipation, diarrhea and nausea.  Skin: Positive for rash.  All other systems reviewed and are negative.      Objective:   Physical Exam  Constitutional: She is oriented to person, place, and time. She appears well-developed and well-nourished.  HENT:  Head: Normocephalic and atraumatic.  Eyes: Conjunctivae and EOM are normal. Pupils are equal, round, and reactive to light.  Neck: Normal range of motion.  Neurological: She is alert and oriented to person, place, and time.  Skin: Skin is warm and dry.  Psychiatric: She has a normal mood and affect.  Nursing note and vitals reviewed.  Positive reverse Phalen Bilateral foot intrinsic atrophy. She has minimal toe flexion and extension. She has 3+, ankle dorsiflexion, plantarflexion. Briefly, he is able to come up on tiptoes or rock back on heels  No lesions in the lower extremities Feet have no evidence of pedal edema. There is some cyanosis at the toes, but they blanch. No pain with palpation. Her feet are cool overall, her pedal pulses are reduced  Assessment & Plan:  1. 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 She has tried a number of opioid and non-opioid pain medication such as gabapentin, Lyrica, methadone, oxycodone, and is currently on morphine. We discussed other treatment options. She tried Nucynta in the past and complained of nausea and some headache. However, she was being worked up for GI issues. At the current time and she continued to have similar symptoms even after coming off this medication. She only used it as immediate release breakthrough medication. Have calculated equal analgesic dose of 90 mg of morphine and this would be Nucynta ER 150 mg twice a day, will write for this. We'll need Medicaid prior off. She has a week's worth of morphine extended release 30 mg 3 times a day while this  process takes place   We'll give one week supply and reassess in clinic.  Continue morphine IR. We'll switch from 15 mg twice a day to 30 mg once a day, which is essentially how she takes it anyway.   Over half of the 25 min visit was spent counseling and coordinating care.

## 2016-01-25 ENCOUNTER — Telehealth: Payer: Self-pay | Admitting: Neurology

## 2016-01-25 ENCOUNTER — Other Ambulatory Visit: Payer: Self-pay | Admitting: Certified Nurse Midwife

## 2016-01-25 ENCOUNTER — Telehealth: Payer: Self-pay | Admitting: *Deleted

## 2016-01-25 DIAGNOSIS — Z1231 Encounter for screening mammogram for malignant neoplasm of breast: Secondary | ICD-10-CM

## 2016-01-25 NOTE — Telephone Encounter (Signed)
[name] called and said she read up about Nucynta and she is on an antidepressant and is concerned about the serotonin syndrome side effect. She has not filled and thinks she wants to stay on the what she was taking. Please advise

## 2016-01-26 NOTE — Telephone Encounter (Signed)
I left message for [place] with information

## 2016-01-26 NOTE — Telephone Encounter (Signed)
I have spoken to Dr who has done research with this med and feels it is safe to take with serotonergic agents since its main effect is on norepinephrene

## 2016-01-30 ENCOUNTER — Other Ambulatory Visit: Payer: Self-pay | Admitting: Family Medicine

## 2016-01-30 LAB — TOXASSURE SELECT,+ANTIDEPR,UR

## 2016-01-30 LAB — 6-ACETYLMORPHINE,TOXASSURE ADD
6-ACETYLMORPHINE: NEGATIVE
6-acetylmorphine: NOT DETECTED ng/mg creat

## 2016-01-31 ENCOUNTER — Telehealth: Payer: Self-pay | Admitting: Physical Medicine & Rehabilitation

## 2016-01-31 ENCOUNTER — Telehealth: Payer: Self-pay | Admitting: Internal Medicine

## 2016-01-31 ENCOUNTER — Other Ambulatory Visit: Payer: Self-pay | Admitting: *Deleted

## 2016-01-31 DIAGNOSIS — I10 Essential (primary) hypertension: Secondary | ICD-10-CM

## 2016-01-31 MED ORDER — MORPHINE SULFATE ER 30 MG PO T12A: 30.0000 mg | EXTENDED_RELEASE_TABLET | Freq: Three times a day (TID) | ORAL | 0 refills | Status: DC

## 2016-01-31 NOTE — Telephone Encounter (Signed)
Patient needs prior authorization on Nucynta. States her pharmacy sent it to our office.  Please call patient.

## 2016-01-31 NOTE — Telephone Encounter (Signed)
RX written for small amount of Morphine sulfate ER to cover her until PA approved.

## 2016-01-31 NOTE — Telephone Encounter (Signed)
PA submitted to [facility] Tracks for Nucynta ER 150 mg #60

## 2016-02-01 ENCOUNTER — Ambulatory Visit: Payer: Medicaid Other | Admitting: Neurology

## 2016-02-01 NOTE — Telephone Encounter (Signed)
Nucynta ER approved by [place] TRACK[facility] 02/01/2016-01/26/2017  Left message informing [place] of approval.

## 2016-02-01 NOTE — Telephone Encounter (Signed)
error 

## 2016-02-01 NOTE — Progress Notes (Signed)
Urine drug screen for this encounter is consistent for prescribed medication 

## 2016-02-03 NOTE — Telephone Encounter (Signed)
Pt states medication Nucynta is not working, causing cold sweats. Pt would like to go back on Morphine. Please advise. Thanks! ep

## 2016-02-03 NOTE — Telephone Encounter (Signed)
Returned call to patient.  States she called the wrong office and meant to call the pain clinic (Dr. Letta Pate).  Can disregard message and she will call the pain clinic.  Burna Forts, BSN, RN-BC

## 2016-02-06 ENCOUNTER — Ambulatory Visit: Payer: Medicaid Other | Admitting: Internal Medicine

## 2016-02-06 ENCOUNTER — Telehealth: Payer: Self-pay | Admitting: Physical Medicine & Rehabilitation

## 2016-02-06 NOTE — Telephone Encounter (Signed)
She says nucynta not working for her and wants to go back on her morphine.

## 2016-02-06 NOTE — Telephone Encounter (Signed)
patient is having issues with nucynta - wants to go back on old meds -  Patient stated this was a trial basis and she would prefer not to take the new medication -  Can her old meds be called in today??

## 2016-02-06 NOTE — Telephone Encounter (Signed)
Before we give up on this. I would like her to try to take it 3 times a day

## 2016-02-07 MED ORDER — MORPHINE SULFATE ER 30 MG PO T12A: 30.0000 mg | EXTENDED_RELEASE_TABLET | Freq: Three times a day (TID) | ORAL | 0 refills | Status: DC

## 2016-02-07 MED ORDER — MORPHINE SULFATE 30 MG PO TABS: ORAL_TABLET | ORAL | 0 refills | Status: DC

## 2016-02-07 NOTE — Telephone Encounter (Signed)
[place] notified.

## 2016-02-07 NOTE — Telephone Encounter (Signed)
May resume MS Contin 30 mg 3 times a day, #90. May resume MS IR, 30 mg,#30 one daily at bedtime

## 2016-02-07 NOTE — Telephone Encounter (Signed)
I called [name] and she says she is having itching and headaches and also doesn't have but a couple of pills left anyway (#14 given ) -- by [place] it was filled 02/01/16. Please advise

## 2016-02-09 ENCOUNTER — Encounter: Payer: Medicaid Other | Admitting: Neurology

## 2016-02-10 ENCOUNTER — Other Ambulatory Visit: Payer: Self-pay | Admitting: *Deleted

## 2016-02-10 MED ORDER — MORPHINE SULFATE ER 30 MG PO T12A: 30.0000 mg | EXTENDED_RELEASE_TABLET | Freq: Three times a day (TID) | ORAL | 0 refills | Status: DC

## 2016-02-15 ENCOUNTER — Other Ambulatory Visit: Payer: Self-pay | Admitting: Physical Medicine & Rehabilitation

## 2016-02-15 NOTE — Telephone Encounter (Signed)
[place] [facility]ACK[facility] PA resubmitted for Morphine Sulfate ER 30 mg #90.  Approved 02/10/16-02/04/2017

## 2016-02-21 ENCOUNTER — Ambulatory Visit (HOSPITAL_BASED_OUTPATIENT_CLINIC_OR_DEPARTMENT_OTHER): Payer: Medicaid Other | Admitting: Physical Medicine & Rehabilitation

## 2016-02-21 ENCOUNTER — Encounter: Payer: Medicaid Other | Attending: Physical Medicine & Rehabilitation

## 2016-02-21 ENCOUNTER — Encounter: Payer: Self-pay | Admitting: Physical Medicine & Rehabilitation

## 2016-02-21 VITALS — BP 137/86 | HR 93 | Resp 14

## 2016-02-21 DIAGNOSIS — E1140 Type 2 diabetes mellitus with diabetic neuropathy, unspecified: Secondary | ICD-10-CM

## 2016-02-21 DIAGNOSIS — Z5181 Encounter for therapeutic drug level monitoring: Secondary | ICD-10-CM | POA: Diagnosis present

## 2016-02-21 DIAGNOSIS — M25561 Pain in right knee: Secondary | ICD-10-CM | POA: Diagnosis present

## 2016-02-21 DIAGNOSIS — M25562 Pain in left knee: Secondary | ICD-10-CM | POA: Insufficient documentation

## 2016-02-21 DIAGNOSIS — Z79899 Other long term (current) drug therapy: Secondary | ICD-10-CM | POA: Diagnosis present

## 2016-02-21 DIAGNOSIS — G894 Chronic pain syndrome: Secondary | ICD-10-CM | POA: Diagnosis not present

## 2016-02-21 NOTE — Progress Notes (Signed)
Subjective:    Patient ID: [name], female    DOB: 12-09-1970, 45 y.o.   MRN: [id number]  HPI Tried Nucynta ER 150mg  BID which caused sweats, and headache and itching.  Pain was not relieved as well as with Morphine. Has EMG testing with Neuro but has had to rescheduled Pt no longer drives due to foot numbness and weakness.   Pain Inventory Average Pain 7 Pain Right Now 7 My pain is constant, sharp, burning, dull, stabbing, tingling and aching  In the last 24 hours, has pain interfered with the following? General activity 9 Relation with others 9 Enjoyment of life 9 What TIME of day is your pain at its worst? evening Sleep (in general) Fair  Pain is worse with: walking, bending, sitting, inactivity, standing and some activites Pain improves with: rest and medication Relief from Meds: 6  Mobility walk without assistance walk with assistance use a cane ability to climb steps?  no do you drive?  no  Function disabled: date disabled .  Neuro/Psych weakness numbness tremor tingling trouble walking spasms dizziness confusion depression anxiety  Prior Studies Any changes since last visit?  no  Physicians involved in your care Any changes since last visit?  no   Family History  Problem Relation Age of Onset  . Stroke Maternal Grandmother   . Colon cancer Maternal Grandmother   . Heart Problems Paternal Grandmother   . Dementia Paternal Grandmother   . Heart attack Paternal Grandfather    Social History   Social History  . Marital status: Legally Separated    Spouse name: N/A  . Number of children: N/A  . Years of education: N/A   Occupational History  . Disabled    Social History Main Topics  . Smoking status: Never Smoker  . Smokeless tobacco: Never Used  . Alcohol use No  . Drug use: No     Comment: rx opiods  . Sexual activity: Not Asked   Other Topics Concern  . None   Social History Narrative  . None   Past Surgical  History:  Procedure Laterality Date  . ABDOMINOPLASTY    . CHOLECYSTECTOMY     Past Medical History:  Diagnosis Date  . DEPRESSION/ANXIETY 08/08/2009  . IBS 02/02/2010  . Polyneuropathy ([place]) 12/04/2010  . Renal stones   . 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) 08/08/2009   BP 137/86   Pulse 93   Resp 14   SpO2 96%   Opioid Risk Score:   Fall Risk Score:  `1  Depression screen PHQ 2/9  Depression screen [facility] 2/9 08/26/2015 07/18/2015 02/14/2015 02/01/2015 12/17/2014 11/22/2014 07/01/2014  Decreased Interest 2 0 2 0 3 3 2   Down, Depressed, Hopeless 1 0 3 0 3 3 2   PHQ - 2 Score 3 0 5 0 6 6 4   Altered sleeping - - 2 - - - 3  Tired, decreased energy - - 3 - - - 3  Change in appetite - - 1 - - - 2  Feeling bad or failure about yourself  - - 3 - - - 3  Trouble concentrating - - 1 - - - 1  Moving slowly or fidgety/restless - - 0 - - - 0  Suicidal thoughts - - 1 - - - 1  PHQ-9 Score - - 16 - - - 17  Difficult doing work/chores - - Very difficult - - - Extremely dIfficult  Some recent data  might be hidden    Review of Systems  Constitutional: Positive for chills and diaphoresis.  HENT: Negative.   Eyes: Negative.   Respiratory: Positive for apnea.   Cardiovascular: Negative.   Gastrointestinal: Positive for abdominal pain, constipation, diarrhea and nausea.  Endocrine: Negative.   Genitourinary: Negative.   Musculoskeletal: Negative.   Skin: Negative.   Allergic/Immunologic: Negative.   Neurological: Negative.   Hematological: Negative.   Psychiatric/Behavioral: Negative.   All other systems reviewed and are negative.      Objective:   Physical Exam  Constitutional: She is oriented to person, place, and time. She appears well-developed and well-nourished.  HENT:  Head: Normocephalic and atraumatic.  Eyes: Conjunctivae and EOM are normal. Pupils are equal, round, and reactive to light.  Neurological:  She is alert and oriented to person, place, and time.  Psychiatric: She has a normal mood and affect.  Nursing note and vitals reviewed.  Negative tinel's Sensation reduced below the knees bilaterally and below wrist bilaterally Mild hand intrinsic atrophy, moderate foot intrinsic atrophy. Motor strength is 4 minus hand intrinsics 4 at grip 5 at bicep, tricep 5 at hip flexor, knee extensor, 2 minus 2 flexion-extension 3 minus, ankle dorsiflexion, extension     Assessment & Plan:  1.  Progressive polyneuropathy. This predated her diagnosis of diabetes and has worsened despite good diabetic control  She has had progressive weakness and numbness over the period of 6 years She has previously failed trials of gabapentin, Lyrica, Cymbalta, nortriptyline and most recently Nucynta. Patient has been on other narcotic analgesics such as methadone and Dilaudid in the past prescribed by other physicians. Has tried other nonnarcotic medication such as capsaicin cream  We'll continue current dose of MS Contin 30mg , 3 times a day, continue MSIR 30 mg once per day  2. Progressive hand symptoms. Has complained of this for several years going back into the records, but this appears to be worsening. She has clinical exam that is mixed, she does have positive reverse Phalen's, negative Tinel's and diffuse pinprick abnormalities in a glove distribution. As discussed with patient would be difficult to tease this out even with EMG. We'll empirically trial right carpal injection next month  Charlett Blake M.D. [facility] FAAPM&R (Sports Med, Neuromuscular Med) Diplomate [place] Board of Electrodiagnostic Med

## 2016-02-21 NOTE — Patient Instructions (Addendum)
Call [facility] Dr Oneida Alar or Dr Darlin Priestly office  Please ask front desk to check on referral to vascular surgery   Please ask front desk to change the 12/ 19 2017 appointment to a right carpal tunnel injection

## 2016-02-27 ENCOUNTER — Ambulatory Visit: Payer: Medicaid Other

## 2016-03-02 ENCOUNTER — Ambulatory Visit: Payer: Medicaid Other | Admitting: Neurology

## 2016-03-06 ENCOUNTER — Other Ambulatory Visit: Payer: Self-pay | Admitting: *Deleted

## 2016-03-06 DIAGNOSIS — R0989 Other specified symptoms and signs involving the circulatory and respiratory systems: Secondary | ICD-10-CM

## 2016-03-07 ENCOUNTER — Other Ambulatory Visit: Payer: Self-pay | Admitting: Internal Medicine

## 2016-03-09 ENCOUNTER — Telehealth: Payer: Self-pay | Admitting: *Deleted

## 2016-03-09 MED ORDER — MORPHINE SULFATE ER 30 MG PO T12A
30.0000 mg | EXTENDED_RELEASE_TABLET | Freq: Three times a day (TID) | ORAL | 0 refills | Status: DC
Start: 2016-03-09 — End: 2016-04-09

## 2016-03-09 NOTE — Telephone Encounter (Signed)
[name] is going to be out of her ER Morphine sulfate before her 12/19 17 appt.  It was filled 02/10/16. I have printed a new Rx for [name] to sign and she will pick it up this afternoon.

## 2016-03-13 ENCOUNTER — Ambulatory Visit (HOSPITAL_BASED_OUTPATIENT_CLINIC_OR_DEPARTMENT_OTHER): Payer: Medicaid Other | Admitting: Physical Medicine & Rehabilitation

## 2016-03-13 ENCOUNTER — Encounter: Payer: Self-pay | Admitting: Physical Medicine & Rehabilitation

## 2016-03-13 ENCOUNTER — Encounter: Payer: Medicaid Other | Attending: Physical Medicine & Rehabilitation

## 2016-03-13 VITALS — BP 140/85 | HR 97

## 2016-03-13 DIAGNOSIS — G5601 Carpal tunnel syndrome, right upper limb: Secondary | ICD-10-CM

## 2016-03-13 DIAGNOSIS — G894 Chronic pain syndrome: Secondary | ICD-10-CM | POA: Insufficient documentation

## 2016-03-13 DIAGNOSIS — Z79899 Other long term (current) drug therapy: Secondary | ICD-10-CM | POA: Diagnosis present

## 2016-03-13 DIAGNOSIS — M25562 Pain in left knee: Secondary | ICD-10-CM | POA: Insufficient documentation

## 2016-03-13 DIAGNOSIS — Z5181 Encounter for therapeutic drug level monitoring: Secondary | ICD-10-CM | POA: Diagnosis present

## 2016-03-13 DIAGNOSIS — M25561 Pain in right knee: Secondary | ICD-10-CM | POA: Insufficient documentation

## 2016-03-13 MED ORDER — MORPHINE SULFATE 30 MG PO TABS: ORAL_TABLET | ORAL | 0 refills | Status: DC

## 2016-03-13 NOTE — Patient Instructions (Signed)
If this injection is helpful, please tell Zella Ball next month and she can set you up with a left-sided carpal tunnel injection

## 2016-03-13 NOTE — Progress Notes (Signed)
Carpal tunnel injection Right with ultrasound guidance)  Indication: Median neuropathy at the wrist documented by ultrasound , cross sectional area of 15 mm and interfering with sleep and other functional activities. Symptoms are not relieved by conservative care.  Informed consent was obtained after describing risks and benefits of the procedure with the patient. These include bleeding bruising and infection as well as nerve injury. Patient elected to proceed and has given written consent. The distal wrist crease was marked and prepped with Betadine. A 30-gauge 1/2 inch needle was inserted and 0.25 ML's of 1% lidocaine injected into the skin and subcutaneous tissue. Then a 27-gauge 1/2 inch needle was inserted into the carpal tunnel and 0.25 mL of depomedrol 40 mg per mL was injected. Patient tolerated procedure well. Post procedure instructions given.

## 2016-03-27 ENCOUNTER — Other Ambulatory Visit: Payer: Self-pay | Admitting: Physical Medicine & Rehabilitation

## 2016-04-02 ENCOUNTER — Ambulatory Visit: Payer: Medicaid Other

## 2016-04-06 NOTE — Telephone Encounter (Signed)
Complete

## 2016-04-07 ENCOUNTER — Other Ambulatory Visit: Payer: Self-pay | Admitting: Physical Medicine & Rehabilitation

## 2016-04-09 ENCOUNTER — Encounter: Payer: Medicaid Other | Attending: Physical Medicine & Rehabilitation | Admitting: Registered Nurse

## 2016-04-09 ENCOUNTER — Encounter: Payer: Self-pay | Admitting: Registered Nurse

## 2016-04-09 VITALS — BP 120/87 | HR 90 | Resp 14

## 2016-04-09 DIAGNOSIS — G894 Chronic pain syndrome: Secondary | ICD-10-CM

## 2016-04-09 DIAGNOSIS — G629 Polyneuropathy, unspecified: Secondary | ICD-10-CM

## 2016-04-09 DIAGNOSIS — M25561 Pain in right knee: Secondary | ICD-10-CM | POA: Diagnosis present

## 2016-04-09 DIAGNOSIS — Z5181 Encounter for therapeutic drug level monitoring: Secondary | ICD-10-CM | POA: Insufficient documentation

## 2016-04-09 DIAGNOSIS — E1140 Type 2 diabetes mellitus with diabetic neuropathy, unspecified: Secondary | ICD-10-CM | POA: Diagnosis not present

## 2016-04-09 DIAGNOSIS — M25562 Pain in left knee: Secondary | ICD-10-CM | POA: Diagnosis present

## 2016-04-09 DIAGNOSIS — Z79899 Other long term (current) drug therapy: Secondary | ICD-10-CM | POA: Insufficient documentation

## 2016-04-09 MED ORDER — MORPHINE SULFATE 30 MG PO TABS: ORAL_TABLET | ORAL | 0 refills | Status: DC

## 2016-04-09 MED ORDER — MORPHINE SULFATE ER 30 MG PO T12A: 30.0000 mg | EXTENDED_RELEASE_TABLET | Freq: Three times a day (TID) | ORAL | 0 refills | Status: DC

## 2016-04-09 NOTE — Progress Notes (Signed)
Subjective:    Patient ID: [name], female    DOB: [birth year], 46 y.o.   MRN: [id number]  HPI: [name] is a 46 year old female who returns for follow up appointment for chronic pain and medication refill. She states her pain is located in her bilateral hands and bilateral lower extremities. She rates her pain 7. Her current exercise regime is walking. S/P Carpal Tunnel Injection with no relief noted.    Pain Inventory Average Pain 7 Pain Right Now 7 My pain is constant, sharp, burning, dull, stabbing, tingling and aching  In the last 24 hours, has pain interfered with the following? General activity 8 Relation with others 8 Enjoyment of life 8 What TIME of day is your pain at its worst? morning, night Sleep (in general) Poor  Pain is worse with: walking, bending, sitting, standing and some activites Pain improves with: rest and medication Relief from Meds: 6  Mobility walk with assistance use a cane Do you have any goals in this area?  yes  Function disabled: date disabled . I need assistance with the following:  meal prep, household duties and shopping Do you have any goals in this area?  yes  Neuro/Psych No problems in this area  Prior Studies Any changes since last visit?  no  Physicians involved in your care Any changes since last visit?  no   Family History  Problem Relation Age of Onset  . Stroke Maternal Grandmother   . Colon cancer Maternal Grandmother   . Heart Problems Paternal Grandmother   . Dementia Paternal Grandmother   . Heart attack Paternal Grandfather    Social History   Social History  . Marital status: Legally Separated    Spouse name: N/A  . Number of children: N/A  . Years of education: N/A   Occupational History  . Disabled    Social History Main Topics  . Smoking status: Never Smoker  . Smokeless tobacco: Never Used  . Alcohol use No  . Drug use: No     Comment: rx opiods  . Sexual activity:  Not Asked   Other Topics Concern  . None   Social History Narrative  . None   Past Surgical History:  Procedure Laterality Date  . ABDOMINOPLASTY    . CHOLECYSTECTOMY     Past Medical History:  Diagnosis Date  . DEPRESSION/ANXIETY 08/08/2009  . IBS 02/02/2010  . Polyneuropathy ([place]) 12/04/2010  . Renal stones   . 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) 08/08/2009   BP 120/87   Pulse 90   Resp 14   SpO2 95%   Opioid Risk Score:   Fall Risk Score:  `1  Depression screen PHQ 2/9  Depression screen [facility] 2/9 08/26/2015 07/18/2015 02/14/2015 02/01/2015 12/17/2014 11/22/2014 07/01/2014  Decreased Interest 2 0 2 0 3 3 2   Down, Depressed, Hopeless 1 0 3 0 3 3 2   PHQ - 2 Score 3 0 5 0 6 6 4   Altered sleeping - - 2 - - - 3  Tired, decreased energy - - 3 - - - 3  Change in appetite - - 1 - - - 2  Feeling bad or failure about yourself  - - 3 - - - 3  Trouble concentrating - - 1 - - - 1  Moving slowly or fidgety/restless - - 0 - - - 0  Suicidal thoughts - - 1 - - - 1  PHQ-9 Score - - 16 - - - 17  Difficult doing work/chores - - Very difficult - - - Extremely dIfficult  Some recent data might be hidden    Review of Systems  Constitutional: Negative.   HENT: Negative.   Eyes: Negative.   Respiratory: Negative.   Cardiovascular: Negative.   Gastrointestinal: Negative.   Endocrine: Negative.   Genitourinary: Negative.   Musculoskeletal: Positive for arthralgias.  Skin: Negative.   Allergic/Immunologic: Negative.   Neurological: Negative.   Hematological: Negative.   Psychiatric/Behavioral: Negative.   All other systems reviewed and are negative.      Objective:   Physical Exam  Constitutional: She is oriented to person, place, and time. She appears well-developed and well-nourished.  HENT:  Head: Normocephalic and atraumatic.  Neck: Normal range of motion. Neck supple.  Cardiovascular: Normal rate and  regular rhythm.   Pulmonary/Chest: Effort normal and breath sounds normal.  Musculoskeletal:  Normal Muscle Bulk and Muscle Testing Reveals: Upper Extremities: Full ROM and Muscle Strength 5/5 Lower Extremities: Full ROM and Muscle Strength 5/5 Arises from Table Slowly  Antalgic Gait  Neurological: She is alert and oriented to person, place, and time.  Skin: Skin is warm and dry.  Psychiatric: She has a normal mood and affect.  Nursing note and vitals reviewed.         Assessment & Plan:  1. Severe peripheral polyneuropathy of undetermined etiology.:  Refilled: MS Contin 30 mg one tablet three times a day#90 and MSIR 30 mg one tablet at HS. We will continue the opioid monitoring program, this consists of regular clinic visits, examinations, urine drug screen, pill counts as well as use of [place] Controlled Substance Reporting System. 2. Bilateral progressive upper extremity hand pain. Continue with current medication regime. Tight Glucose Control. Adhere to healthy diet regime. 3. Muscle Spasms: Continue Zanaflex.  4. Depression/Anxiety: Continue Paxil and Klonopin.  20 minutes of face to face patient care time spent during this visit. All questions were encouraged and answered.   F/U in 1 month

## 2016-04-10 ENCOUNTER — Encounter: Payer: Self-pay | Admitting: Vascular Surgery

## 2016-04-10 ENCOUNTER — Encounter: Payer: Medicaid Other | Admitting: Registered Nurse

## 2016-04-12 ENCOUNTER — Encounter (HOSPITAL_COMMUNITY): Payer: Medicaid Other

## 2016-04-12 ENCOUNTER — Encounter: Payer: Medicaid Other | Admitting: Vascular Surgery

## 2016-04-15 ENCOUNTER — Other Ambulatory Visit: Payer: Self-pay | Admitting: Internal Medicine

## 2016-04-19 ENCOUNTER — Ambulatory Visit: Payer: Medicaid Other | Admitting: Internal Medicine

## 2016-04-19 ENCOUNTER — Other Ambulatory Visit: Payer: Medicaid Other

## 2016-04-19 DIAGNOSIS — R194 Change in bowel habit: Secondary | ICD-10-CM

## 2016-04-19 DIAGNOSIS — R140 Abdominal distension (gaseous): Secondary | ICD-10-CM

## 2016-04-19 DIAGNOSIS — R7401 Elevation of levels of liver transaminase levels: Secondary | ICD-10-CM

## 2016-04-19 DIAGNOSIS — R740 Nonspecific elevation of levels of transaminase and lactic acid dehydrogenase [LDH]: Secondary | ICD-10-CM

## 2016-04-20 ENCOUNTER — Ambulatory Visit: Payer: Medicaid Other | Admitting: Internal Medicine

## 2016-04-20 NOTE — Progress Notes (Deleted)
   [facility] Phone: [phone]   Date of Visit: 04/20/2016   HPI:  [name] is a 46 y.o. female presenting to clinic today for same day appointment. PCP: Darci Needle, MD Concerns today include:    ROS: See HPI.  [place]:  PMH: HTN DM2 HLD Depression/Anxiety Obesity   PHYSICAL EXAM: There were no vitals taken for this visit. Gen: *** HEENT: *** Heart: *** Lungs: *** Neuro: *** Ext: ***  ASSESSMENT/PLAN:  Health maintenance:  -***  No problem-specific Assessment & Plan notes found for this encounter.  FOLLOW UP: Follow up in *** for ***  Smiley Houseman, MD PGY [place]

## 2016-04-25 LAB — CALPROTECTIN, FECAL: Calprotectin, Fecal: 18 ug/g (ref 0–120)

## 2016-04-26 ENCOUNTER — Telehealth: Payer: Self-pay

## 2016-04-26 NOTE — Telephone Encounter (Signed)
Informed pt of the results. She states that she continues to have diarrhea qd-qod and has generalized sharp abdominal pain. She said you had mentioned having a colon and she also states that her brother has a hx of benign colon polyps.

## 2016-04-26 NOTE — Telephone Encounter (Signed)
-----   Message from Manus Gunning, MD sent at 04/26/2016  7:52 AM EST ----- [name] can you please relay to this patient that her fecal calprotectin lab is negative. This was ordered this past July but it appears she just submitted it now. This result makes it very unlikely she has inflammatory diarrhea, and I suspect she more than likely has IBS. I previously treated her with course of Rifaximin over the summer, hopefully this provided some benefit. She tested negative for celiac.   Can you let her know results of her test, and if her symptoms persist she can see me back in the clinic for reassessment. Thanks

## 2016-04-26 NOTE — Telephone Encounter (Signed)
If her symptoms persists and she wishes to have a colonoscopy we can do that, in light of her family history, although lactoferrin makes it unlikely she has inflammatory etiology. I suspect she more likely has IBS and if she would prefer to see me in clinic first we can discuss options. thanks

## 2016-04-27 ENCOUNTER — Telehealth: Payer: Self-pay | Admitting: Registered Nurse

## 2016-04-27 DIAGNOSIS — F3289 Other specified depressive episodes: Secondary | ICD-10-CM

## 2016-04-27 DIAGNOSIS — F411 Generalized anxiety disorder: Secondary | ICD-10-CM

## 2016-04-27 NOTE — Telephone Encounter (Signed)
Left message for pt. Please schedule an appt in office with Dr Havery Moros if she wishes or book her for a colon. Thanks.

## 2016-04-27 NOTE — Telephone Encounter (Signed)
Referral for Dr. Sima Matas

## 2016-04-30 ENCOUNTER — Other Ambulatory Visit: Payer: Self-pay | Admitting: Internal Medicine

## 2016-05-01 ENCOUNTER — Telehealth: Payer: Self-pay

## 2016-05-01 ENCOUNTER — Encounter: Payer: Self-pay | Admitting: Gastroenterology

## 2016-05-01 NOTE — Telephone Encounter (Signed)
Left patient a message to call back. Her actual result is in the normal range, what she is seeing all the ranges.

## 2016-05-02 ENCOUNTER — Encounter (HOSPITAL_COMMUNITY): Payer: Medicaid Other

## 2016-05-04 ENCOUNTER — Encounter: Payer: Self-pay | Admitting: Registered Nurse

## 2016-05-04 ENCOUNTER — Encounter: Payer: Medicaid Other | Attending: Physical Medicine & Rehabilitation | Admitting: Registered Nurse

## 2016-05-04 VITALS — BP 142/75 | HR 98 | Resp 14

## 2016-05-04 DIAGNOSIS — Z5181 Encounter for therapeutic drug level monitoring: Secondary | ICD-10-CM | POA: Diagnosis present

## 2016-05-04 DIAGNOSIS — E1140 Type 2 diabetes mellitus with diabetic neuropathy, unspecified: Secondary | ICD-10-CM

## 2016-05-04 DIAGNOSIS — F411 Generalized anxiety disorder: Secondary | ICD-10-CM

## 2016-05-04 DIAGNOSIS — G629 Polyneuropathy, unspecified: Secondary | ICD-10-CM

## 2016-05-04 DIAGNOSIS — M25561 Pain in right knee: Secondary | ICD-10-CM | POA: Diagnosis present

## 2016-05-04 DIAGNOSIS — M25562 Pain in left knee: Secondary | ICD-10-CM | POA: Diagnosis present

## 2016-05-04 DIAGNOSIS — G894 Chronic pain syndrome: Secondary | ICD-10-CM | POA: Diagnosis not present

## 2016-05-04 DIAGNOSIS — Z79899 Other long term (current) drug therapy: Secondary | ICD-10-CM | POA: Diagnosis present

## 2016-05-04 DIAGNOSIS — M62838 Other muscle spasm: Secondary | ICD-10-CM

## 2016-05-04 DIAGNOSIS — F3289 Other specified depressive episodes: Secondary | ICD-10-CM

## 2016-05-04 MED ORDER — MORPHINE SULFATE ER 30 MG PO T12A: 30.0000 mg | EXTENDED_RELEASE_TABLET | Freq: Three times a day (TID) | ORAL | 0 refills | Status: DC

## 2016-05-04 MED ORDER — MORPHINE SULFATE 30 MG PO TABS: ORAL_TABLET | ORAL | 0 refills | Status: DC

## 2016-05-04 NOTE — Progress Notes (Signed)
Subjective:    Patient ID: [name], female    DOB: 14-Sep-1970, 46 y.o.   MRN: [id number]  HPI: [name] is a 46 year old female who returns for follow up appointment for chronic pain and medication refill. She states her pain is located in her bilateral hands, bilateral lower extremities and bilateral feet. She rates her pain 6. Her current exercise regime is walking.  Pain Inventory Average Pain 7 Pain Right Now 6 My pain is constant, sharp, burning, dull, stabbing, tingling and aching  In the last 24 hours, has pain interfered with the following? General activity 10 Relation with others 10 Enjoyment of life 10 What TIME of day is your pain at its worst? morning, evening Sleep (in general) Fair  Pain is worse with: walking, bending, standing and some activites Pain improves with: rest and medication Relief from Meds: 7  Mobility walk without assistance walk with assistance use a cane how many minutes can you walk? 5-10 ability to climb steps?  no do you drive?  no needs help with transfers transfers alone Do you have any goals in this area?  yes  Function disabled: date disabled . I need assistance with the following:  household duties and shopping Do you have any goals in this area?  yes  Neuro/Psych weakness numbness tingling trouble walking spasms dizziness depression anxiety  Prior Studies Any changes since last visit?  no  Physicians involved in your care Any changes since last visit?  no   Family History  Problem Relation Age of Onset  . Stroke Maternal Grandmother   . Colon cancer Maternal Grandmother   . Heart Problems Paternal Grandmother   . Dementia Paternal Grandmother   . Heart attack Paternal Grandfather    Social History   Social History  . Marital status: Legally Separated    Spouse name: N/A  . Number of children: N/A  . Years of education: N/A   Occupational History  . Disabled    Social History  Main Topics  . Smoking status: Never Smoker  . Smokeless tobacco: Never Used  . Alcohol use No  . Drug use: No     Comment: rx opiods  . Sexual activity: Not on file   Other Topics Concern  . Not on file   Social History Narrative  . No narrative on file   Past Surgical History:  Procedure Laterality Date  . ABDOMINOPLASTY    . CHOLECYSTECTOMY     Past Medical History:  Diagnosis Date  . DEPRESSION/ANXIETY 08/08/2009  . IBS 02/02/2010  . Polyneuropathy ([place]) 12/04/2010  . Renal stones   . 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  Opioid Risk Score:   Fall Risk Score:  `1  Depression screen PHQ 2/9  Depression screen [facility] 2/9 08/26/2015 07/18/2015 02/14/2015 02/01/2015 12/17/2014 11/22/2014 07/01/2014  Decreased Interest 2 0 2 0 3 3 2   Down, Depressed, Hopeless 1 0 3 0 3 3 2   PHQ - 2 Score 3 0 5 0 6 6 4   Altered sleeping - - 2 - - - 3  Tired, decreased energy - - 3 - - - 3  Change in appetite - - 1 - - - 2  Feeling bad or failure about yourself  - - 3 - - - 3  Trouble concentrating - - 1 - - - 1  Moving slowly  or fidgety/restless - - 0 - - - 0  Suicidal thoughts - - 1 - - - 1  PHQ-9 Score - - 16 - - - 17  Difficult doing work/chores - - Very difficult - - - Extremely dIfficult  Some recent data might be hidden    Review of Systems  HENT: Negative.   Eyes: Negative.   Respiratory: Negative.   Cardiovascular: Negative.   Gastrointestinal: Negative.   Endocrine: Negative.   Genitourinary: Negative.   Musculoskeletal: Positive for arthralgias and gait problem.       Spasms   Skin: Negative.   Neurological: Positive for dizziness, weakness and numbness.       Tingling   Psychiatric/Behavioral: The patient is nervous/anxious.   All other systems reviewed and are negative.      Objective:   Physical Exam  Constitutional: She is oriented  to person, place, and time. She appears well-developed and well-nourished.  HENT:  Head: Normocephalic and atraumatic.  Neck: Normal range of motion. Neck supple.  Cardiovascular: Normal rate and regular rhythm.   Pulmonary/Chest: Effort normal and breath sounds normal.  Musculoskeletal:  Normal Muscle Bulk and Muscle Testing Reveals: Upper Extremities: Full ROM and Muscle Strength 5/5 Lower Extremities: Full ROM and Muscle Strength 5/5 Arises from Table Slowly Narrow Based Gait  Neurological: She is alert and oriented to person, place, and time.  Skin: Skin is warm and dry.  Psychiatric: She has a normal mood and affect.  Nursing note and vitals reviewed.         Assessment & Plan:  1. Severe peripheral polyneuropathy of undetermined etiology.: 05/04/2016 Refilled: MS Contin 30 mg one tablet three times a day#90 and MSIR 30 mg one tablet at HS. We will continue the opioid monitoring program, this consists of regular clinic visits, examinations, urine drug screen, pill counts as well as use of [place] Controlled Substance Reporting System. 2. Bilateral progressive upper extremity hand pain/ Polyneuropathy. Continue with current medication regime. Tight Glucose Control. Adhere to healthy diet regime.05/04/2016 3. Muscle Spasms: Continue Zanaflex. 05/04/2016 4. Depression/Anxiety: Continue Paxil and Klonopin. 05/04/2016  20 minutes of face to face patient care time was spent during this visit. All questions were encouraged and answered.  F/U in 1 month

## 2016-05-10 ENCOUNTER — Encounter: Payer: Medicaid Other | Admitting: Neurology

## 2016-05-11 LAB — TOXASSURE SELECT,+ANTIDEPR,UR

## 2016-05-11 LAB — 6-ACETYLMORPHINE,TOXASSURE ADD
6-ACETYLMORPHINE: NEGATIVE
6-ACETYLMORPHINE: NOT DETECTED ng/mg{creat}

## 2016-05-11 NOTE — Telephone Encounter (Signed)
Left message for pt to return call. Does she want to be seen in clinic or book colon?

## 2016-05-15 ENCOUNTER — Ambulatory Visit: Payer: Medicaid Other | Admitting: Obstetrics and Gynecology

## 2016-05-15 ENCOUNTER — Encounter: Payer: Self-pay | Admitting: Obstetrics and Gynecology

## 2016-05-15 NOTE — Progress Notes (Signed)
Patient did not keep GYN appointment for 05/15/2016.  Marilu Rylander, Jr MD Attending [facility] (Faculty Practice)   

## 2016-05-16 ENCOUNTER — Other Ambulatory Visit: Payer: Self-pay | Admitting: Physical Medicine & Rehabilitation

## 2016-05-16 ENCOUNTER — Other Ambulatory Visit: Payer: Self-pay

## 2016-05-16 NOTE — Telephone Encounter (Signed)
Printed script for klonopin was called into the pharmacy.

## 2016-05-18 NOTE — Progress Notes (Signed)
Urine drug screen for this encounter is consistent for prescribed medication 

## 2016-05-20 ENCOUNTER — Other Ambulatory Visit: Payer: Self-pay | Admitting: Internal Medicine

## 2016-05-21 ENCOUNTER — Other Ambulatory Visit: Payer: Self-pay | Admitting: Internal Medicine

## 2016-06-01 ENCOUNTER — Encounter: Payer: Medicaid Other | Admitting: Registered Nurse

## 2016-06-05 ENCOUNTER — Encounter: Payer: Medicaid Other | Attending: Physical Medicine & Rehabilitation | Admitting: Registered Nurse

## 2016-06-05 ENCOUNTER — Encounter: Payer: Self-pay | Admitting: Registered Nurse

## 2016-06-05 VITALS — BP 154/91 | HR 102

## 2016-06-05 DIAGNOSIS — G629 Polyneuropathy, unspecified: Secondary | ICD-10-CM

## 2016-06-05 DIAGNOSIS — F3289 Other specified depressive episodes: Secondary | ICD-10-CM

## 2016-06-05 DIAGNOSIS — F411 Generalized anxiety disorder: Secondary | ICD-10-CM | POA: Diagnosis not present

## 2016-06-05 DIAGNOSIS — E1140 Type 2 diabetes mellitus with diabetic neuropathy, unspecified: Secondary | ICD-10-CM | POA: Diagnosis not present

## 2016-06-05 DIAGNOSIS — Z79899 Other long term (current) drug therapy: Secondary | ICD-10-CM | POA: Diagnosis present

## 2016-06-05 DIAGNOSIS — M25561 Pain in right knee: Secondary | ICD-10-CM | POA: Insufficient documentation

## 2016-06-05 DIAGNOSIS — Z5181 Encounter for therapeutic drug level monitoring: Secondary | ICD-10-CM | POA: Diagnosis present

## 2016-06-05 DIAGNOSIS — G894 Chronic pain syndrome: Secondary | ICD-10-CM | POA: Diagnosis not present

## 2016-06-05 DIAGNOSIS — M25562 Pain in left knee: Secondary | ICD-10-CM | POA: Diagnosis present

## 2016-06-05 MED ORDER — MORPHINE SULFATE 30 MG PO TABS: ORAL_TABLET | ORAL | 0 refills | Status: DC

## 2016-06-05 MED ORDER — MORPHINE SULFATE ER 30 MG PO T12A: 30.0000 mg | EXTENDED_RELEASE_TABLET | Freq: Three times a day (TID) | ORAL | 0 refills | Status: DC

## 2016-06-05 MED ORDER — CLONAZEPAM 0.5 MG PO TABS
1.5000 mg | ORAL_TABLET | Freq: Every day | ORAL | 0 refills | Status: DC
Start: 2016-06-05 — End: 2016-07-04

## 2016-06-05 NOTE — Progress Notes (Signed)
Subjective:    Patient ID: [name], female    DOB: July 08, 1970, 46 y.o.   MRN: [id number]  HPI: [name] is a 46 year old female who returns for follow up appointment for chronic pain and medication refill. She states her pain is located in her bilateral hands, bilateral lower extremities R>L and bilateral feet. She rates her pain 8. Her current exercise regime is walking.  Pain Inventory Average Pain 7 Pain Right Now 8 My pain is constant, sharp, burning, dull, stabbing, tingling and aching  In the last 24 hours, has pain interfered with the following? General activity 10 Relation with others 10 Enjoyment of life 10 What TIME of day is your pain at its worst? morning and evening Sleep (in general) .  Pain is worse with: . Pain improves with: medication Relief from Meds: 5  Mobility walk without assistance walk with assistance use a cane ability to climb steps?  no do you drive?  no  Function disabled: date disabled . Do you have any goals in this area?  no  Neuro/Psych weakness numbness tremor tingling trouble walking spasms dizziness depression anxiety  Prior Studies Any changes since last visit?  no  Physicians involved in your care Any changes since last visit?  no   Family History  Problem Relation Age of Onset  . Stroke Maternal Grandmother   . Colon cancer Maternal Grandmother   . Heart Problems Paternal Grandmother   . Dementia Paternal Grandmother   . Heart attack Paternal Grandfather    Social History   Social History  . Marital status: Legally Separated    Spouse name: N/A  . Number of children: N/A  . Years of education: N/A   Occupational History  . Disabled    Social History Main Topics  . Smoking status: Never Smoker  . Smokeless tobacco: Never Used  . Alcohol use No  . Drug use: No     Comment: rx opiods  . Sexual activity: Not on file   Other Topics Concern  . Not on file   Social History  Narrative  . No narrative on file   Past Surgical History:  Procedure Laterality Date  . ABDOMINOPLASTY    . CHOLECYSTECTOMY     Past Medical History:  Diagnosis Date  . DEPRESSION/ANXIETY 08/08/2009  . IBS 02/02/2010  . Polyneuropathy ([place]) 12/04/2010  . Renal stones   . 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  Opioid Risk Score:   Fall Risk Score:  `1  Depression screen PHQ 2/9  Depression screen [facility] 2/9 08/26/2015 07/18/2015 02/14/2015 02/01/2015 12/17/2014 11/22/2014 07/01/2014  Decreased Interest 2 0 2 0 3 3 2   Down, Depressed, Hopeless 1 0 3 0 3 3 2   PHQ - 2 Score 3 0 5 0 6 6 4   Altered sleeping - - 2 - - - 3  Tired, decreased energy - - 3 - - - 3  Change in appetite - - 1 - - - 2  Feeling bad or failure about yourself  - - 3 - - - 3  Trouble concentrating - - 1 - - - 1  Moving slowly or fidgety/restless - - 0 - - - 0  Suicidal thoughts - - 1 - - - 1  PHQ-9 Score - - 16 - - - 17  Difficult doing work/chores - - Very difficult - - -  Extremely dIfficult  Some recent data might be hidden     Review of Systems  Constitutional: Negative.   HENT: Negative.   Eyes: Negative.   Respiratory: Negative.   Cardiovascular: Negative.   Gastrointestinal: Negative.   Endocrine: Negative.   Genitourinary: Negative.   Musculoskeletal: Negative.   Skin: Negative.   Allergic/Immunologic: Negative.   Neurological: Negative.   Hematological: Negative.   Psychiatric/Behavioral: Negative.   All other systems reviewed and are negative.      Objective:   Physical Exam  Constitutional: She is oriented to person, place, and time. She appears well-developed and well-nourished.  HENT:  Head: Normocephalic and atraumatic.  Neck: Normal range of motion. Neck supple.  Cardiovascular: Normal rate and regular rhythm.   Pulmonary/Chest: Effort normal and breath  sounds normal.  Musculoskeletal:  Normal Muscle Bulk and Muscle Testing Reveals: Upper Extremities: Full ROM and Muscle Strength 5/5 Lower Extremities: Full ROM and Muscle Strength 5/5 Arises from Table with ease Narrow Based Gait  Neurological: She is alert and oriented to person, place, and time.  Skin: Skin is warm and dry.  Psychiatric: She has a normal mood and affect.  Nursing note and vitals reviewed.         Assessment & Plan:  1. Severe peripheral polyneuropathy of undetermined etiology.: 06/06/2016 Refilled: Morphabond 30 mg one tablet three times a day#90 and MSIR 30mg  one tablet at HS. We will continue the opioid monitoring program, this consists of regular clinic visits, examinations, urine drug screen, pill counts as well as use of [place] Controlled Substance Reporting System. 2. Bilateral progressive upper extremity hand pain/ Polyneuropathy. Continue with current medication regime. Tight Glucose Control. Adhere to healthy diet regime.06/06/2016 3. Muscle Spasms: Continue Zanaflex. 06/06/2016 4. Depression/Anxiety: Continue Paxil and Klonopin. 06/06/2016  20 minutes of face to face patient care time was spent during this visit. All questions were encouraged and answered.  F/U in 1 month

## 2016-06-06 ENCOUNTER — Telehealth: Payer: Self-pay | Admitting: Registered Nurse

## 2016-06-06 NOTE — Telephone Encounter (Signed)
On 06/06/2016 the [place] was reviewed no conflict was seen on the [place] with multiple prescribers. Ms. Onofre has a signed narcotic contract with our office. If there were any discrepancies this would have been reported to her physician.

## 2016-06-21 ENCOUNTER — Encounter (HOSPITAL_BASED_OUTPATIENT_CLINIC_OR_DEPARTMENT_OTHER): Payer: Medicaid Other | Admitting: Psychology

## 2016-06-21 DIAGNOSIS — G894 Chronic pain syndrome: Secondary | ICD-10-CM

## 2016-06-21 DIAGNOSIS — F331 Major depressive disorder, recurrent, moderate: Secondary | ICD-10-CM

## 2016-06-21 DIAGNOSIS — G629 Polyneuropathy, unspecified: Secondary | ICD-10-CM | POA: Diagnosis not present

## 2016-06-21 DIAGNOSIS — F411 Generalized anxiety disorder: Secondary | ICD-10-CM

## 2016-06-27 NOTE — Progress Notes (Signed)
Neuropsychological Consultation   Patient:   [name]   DOB:   12/13/1970  MR Number:  [id number]  Location:  [facility] [address] [place] [id number] [place] [id number] Dept: [phone]           Date of Service:   06/21/2016  Start Time:   2 PM End Time:   3 PM  Provider/Observer:  Ilean Skill, Psy.D.       Clinical Neuropsychologist       Billing Code/Service: [phone] 4 Units  Chief Complaint:    The patient describes significant issues related to depression anxiety. She has been dealing with chronic pain disorder and neuropathy producing a lot of pain and discomfort in her feet and hands. The patient has been dealing with anxiety and depression for some time and reports that it really worsened after her daughter was born in 2001. She then developed neuropathy in her hands and feet. The patient reports that numerous interventions have been attempted including medications such as gabapentin and Lyrica. However, she reports that those medications had little or no benefit to her as far as her pain symptoms. She was placed on methadone for some time and felt that it really helped and now she is taking morphine to treat pain. Her treating physicians are doing the best to keep opiate medications to a minimum.  The patient does a long history of disruptive behaviors and depression going back even before her daughter was born. She reports that one time that she was diagnosed with borderline personality disorder. She reports that she was always very reckless and impulsive. The patient reports that she was married for 86 years but that this marriage was quite chaotic. However, she reports that her husband at the time was a substance abuse/attic and a sociopath. She reports that she does have a long and good relationship with her daughter and does have some long-term friends.  She reports that her interpersonal issues go back to her teen years.  Reason for Service:  The patient was referred by Danella Sensing, nurse practitioner for neuropsychological/psychotherapeutic interventions.  Current Status:  The patient reports moderate to significant symptoms of depression, anxiety, mood changes, sleep disturbance, racing thoughts, insomnia, loss of interest, irritability, excessive worrying, suicidal thoughts at times, low energy, panic attacks, ritualized or other obsessive and compulsive behaviors, and poor concentration. She reports that because of her chronic pain she lives a very sedentary life style and is unable to complete goals are stay on schedule.  Reliability of Information: Information is derived from a review of available medical records as well as a 1 hour clinical interview with the patient.  Behavioral Observation: [name]  presents as a 46 y.o.-year-old Right  Female who appeared her stated age. her dress was Appropriate and she was Well Groomed and her manners were Appropriate to the situation.  her participation was indicative of Appropriate behaviors.  There were  physical disabilities noted due to apparent pain symptoms.  she displayed an appropriate level of cooperation and motivation.     Interactions:    Active Appropriate and Attentive  Attention:   within normal limits and attention span and concentration were age appropriate  Memory:   within normal limits; recent and remote memory intact  Visuo-spatial:  within normal limits  Speech (Volume):  normal  Speech:   normal; normal  Thought Process:  Coherent and Relevant  Though  Content:  WNL; thought content was focused on questions at hand but she did acknowledge times of suicidal ideation she denied any current goal, plan, or attention. She does contract for safety and we reviewed appropriate responses of the suicidal ideations or become intrusive.  Orientation:   person, place,  time/date and situation  Judgment:   Fair  Planning:   Fair  Affect:    Anxious, Depressed and Irritable  Mood:    Anxious and Depressed  Insight:   Fair  Intelligence:   normal  Marital Status/Living: The patient is separated from her husband for 5 years. They have not divorce because she does not know exactly where he is and she has had no financial motivation or other reasons to finalizing a divorce. She currently lives with her nephew and her daughter. Her daughter is a 48 year old female. The patient was born in [place] and grew up in [place]. Her parents divorced are both living. Her mother lives in [place] and her father lives in [place]. She reports that she sees her mother often and only sees her father on holidays.  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  Substance Use:  No concerns of substance abuse are reported.  The patient has been treated with opiates for pain in the past and took methadone for some time and is currently taking morphine. However, the patient reports that she is always been quite attentive to following prescribed protocols for these medications. The patient denies other substance abuse.  Education:   Secretary/administrator  the patient received her bachelors degree in [place] from the [employer].  Medical History:   Past Medical History:  Diagnosis Date  . DEPRESSION/ANXIETY 08/08/2009  . IBS 02/02/2010  . Polyneuropathy ([place]) 12/04/2010  . Renal stones   . 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) 08/08/2009            Abuse/Trauma  History: Patient has not been in depth about any history of abuse or trauma but she does acknowledge a very difficult marriage that lasted for 5 years and describes her estranged husband as being an addict and sociopath. She is not legally divorced from him yet because of the lack of a acute knee, financial reasons and the fact that she is not really sure where he lives. She has no contact with  Psychiatric History:  The patient acknowledges a long psychiatric history she has been treated for depression and anxiety and interpersonal issues in the past. The patient reports that she had 2 suicide attempts when she was 46 years old and again when she was 46 years old he was diagnosed with borderline personality disorder. She reports that she is not currently suicidal in any way. She has had numerous psychiatric interventions to the years and the most recent was with Dr. Alvia Grove in [place].  Family Med/Psych History:  Family History  Problem Relation Age of Onset  . Stroke Maternal Grandmother   . Colon cancer Maternal Grandmother   . Heart Problems Paternal Grandmother   . Dementia Paternal Grandmother   . Heart  attack Paternal Grandfather     Risk of Suicide/Violence: low the patient does admit a history of suicidal ideation aside attempts when she was as of age. Time, they diagnosed with borderline personality disorder. She denies any current or recent suicidal ideation or suicide attempts.  Impression/DX:  The patient has a long history of depression anxiety and when she was a teenager and young adult and episodes of severe depression. She been diagnosed with borderline personality disorder in the past and has had 2 previous suicide attempts when she was a teenager or young adult. He denies any recent symptoms of suicidal ideation. The patient does have a long good relationship with her daughter and had a 29 or 5 year marriage even though her husband was described as an addict and  sociopath. The patient has developed significant neuropathy in her hands and feet and is experiencing severe pain that the only intervention that is helped according to the patient has been opiates and they're currently using long acting opiates to treat at the minimal level needed to address her pain.   Disposition/Plan:  We will set the patient up for individual psychotherapeutic interventions to coordinate facilitate ongoing medical interventions for her severe neuropathy.  Diagnosis:    Polyneuropathy (HCC)  Chronic pain syndrome  Generalized anxiety disorder  Moderate episode of recurrent major depressive disorder ([place])         Electronically Signed   _______________________ Ilean Skill, Psy.D.

## 2016-07-02 ENCOUNTER — Other Ambulatory Visit: Payer: Self-pay | Admitting: Physical Medicine & Rehabilitation

## 2016-07-02 ENCOUNTER — Other Ambulatory Visit: Payer: Self-pay | Admitting: Internal Medicine

## 2016-07-02 ENCOUNTER — Encounter: Payer: Medicaid Other | Admitting: Registered Nurse

## 2016-07-04 ENCOUNTER — Encounter: Payer: Medicaid Other | Attending: Physical Medicine & Rehabilitation | Admitting: Registered Nurse

## 2016-07-04 ENCOUNTER — Encounter: Payer: Self-pay | Admitting: Registered Nurse

## 2016-07-04 ENCOUNTER — Other Ambulatory Visit: Payer: Self-pay | Admitting: Internal Medicine

## 2016-07-04 VITALS — BP 142/91 | HR 99

## 2016-07-04 DIAGNOSIS — G629 Polyneuropathy, unspecified: Secondary | ICD-10-CM

## 2016-07-04 DIAGNOSIS — Z79899 Other long term (current) drug therapy: Secondary | ICD-10-CM

## 2016-07-04 DIAGNOSIS — M25561 Pain in right knee: Secondary | ICD-10-CM | POA: Insufficient documentation

## 2016-07-04 DIAGNOSIS — M62838 Other muscle spasm: Secondary | ICD-10-CM | POA: Diagnosis not present

## 2016-07-04 DIAGNOSIS — M25562 Pain in left knee: Secondary | ICD-10-CM | POA: Diagnosis present

## 2016-07-04 DIAGNOSIS — F3289 Other specified depressive episodes: Secondary | ICD-10-CM | POA: Diagnosis not present

## 2016-07-04 DIAGNOSIS — Z5181 Encounter for therapeutic drug level monitoring: Secondary | ICD-10-CM

## 2016-07-04 DIAGNOSIS — F411 Generalized anxiety disorder: Secondary | ICD-10-CM

## 2016-07-04 DIAGNOSIS — G894 Chronic pain syndrome: Secondary | ICD-10-CM

## 2016-07-04 MED ORDER — MORPHINE SULFATE 30 MG PO TABS: ORAL_TABLET | ORAL | 0 refills | Status: DC

## 2016-07-04 MED ORDER — CLONAZEPAM 0.5 MG PO TABS: 1.5000 mg | ORAL_TABLET | Freq: Every day | ORAL | 0 refills | Status: DC

## 2016-07-04 MED ORDER — MORPHINE SULFATE ER 30 MG PO T12A: 30.0000 mg | EXTENDED_RELEASE_TABLET | Freq: Three times a day (TID) | ORAL | 0 refills | Status: DC

## 2016-07-04 NOTE — Progress Notes (Signed)
Subjective:    Patient ID: [name], female    DOB: 04-19-1970, 46 y.o.   MRN: [id number]  HPI: [name] is a 46year old female who returns for follow up appointment for chronic pain and medication refill. She states her pain is located in her bilateral hands,bilateral lower extremities R>L and bilateral feet. She rates her pain 7. Her current exercise regime is walking, also states she has increase her outside activities.  [name] last UDS was on 05/04/2016, it was consistent.  Pain Inventory Average Pain 8 Pain Right Now 7 My pain is constant, sharp, burning, dull, stabbing, tingling and aching  In the last 24 hours, has pain interfered with the following? General activity 9 Relation with others 9 Enjoyment of life 9 What TIME of day is your pain at its worst? morning and evening Sleep (in general) Fair  Pain is worse with: walking, bending and standing Pain improves with: rest and medication Relief from Meds: 6  Mobility walk without assistance walk with assistance use a cane how many minutes can you walk? 5-10 ability to climb steps?  no do you drive?  no use a wheelchair needs help with transfers transfers alone  Function disabled: date disabled . I need assistance with the following:  meal prep, household duties and shopping  Neuro/Psych weakness numbness tremor tingling trouble walking spasms dizziness depression anxiety  Prior Studies Any changes since last visit?  no  Physicians involved in your care Any changes since last visit?  no   Family History  Problem Relation Age of Onset  . Stroke Maternal Grandmother   . Colon cancer Maternal Grandmother   . Heart Problems Paternal Grandmother   . Dementia Paternal Grandmother   . Heart attack Paternal Grandfather    Social History   Social History  . Marital status: Legally Separated    Spouse name: N/A  . Number of children: N/A  . Years of education:  N/A   Occupational History  . Disabled    Social History Main Topics  . Smoking status: Never Smoker  . Smokeless tobacco: Never Used  . Alcohol use No  . Drug use: No     Comment: rx opiods  . Sexual activity: Not Asked   Other Topics Concern  . None   Social History Narrative  . None   Past Surgical History:  Procedure Laterality Date  . ABDOMINOPLASTY    . CHOLECYSTECTOMY     Past Medical History:  Diagnosis Date  . DEPRESSION/ANXIETY 08/08/2009  . IBS 02/02/2010  . Polyneuropathy ([place]) 12/04/2010  . Renal stones   . 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) 08/08/2009   BP (!) 142/91   Pulse 99   SpO2 98%   Opioid Risk Score:   Fall Risk Score:  `1  Depression screen PHQ 2/9  Depression screen [facility] 2/9 07/04/2016 08/26/2015 07/18/2015 02/14/2015 02/01/2015 12/17/2014 11/22/2014  Decreased Interest 2 2 0 2 0 3 3  Down, Depressed, Hopeless 1 1 0 3 0 3 3  PHQ - 2 Score 3 3 0 5 0 6 6  Altered sleeping - - - 2 - - -  Tired, decreased energy - - - 3 - - -  Change in appetite - - - 1 - - -  Feeling bad or failure about yourself  - - - 3 - - -  Trouble concentrating - - - 1 - - -  Moving  slowly or fidgety/restless - - - 0 - - -  Suicidal thoughts - - - 1 - - -  PHQ-9 Score - - - 16 - - -  Difficult doing work/chores - - - Very difficult - - -  Some recent data might be hidden    Review of Systems  Constitutional: Negative.   HENT: Negative.   Eyes: Negative.   Respiratory: Negative.   Cardiovascular: Negative.   Gastrointestinal: Negative.   Endocrine: Negative.   Genitourinary: Negative.   Musculoskeletal: Negative.   Skin: Negative.   Allergic/Immunologic: Negative.   Neurological: Negative.   Hematological: Negative.   Psychiatric/Behavioral: Negative.   All other systems reviewed and are negative.      Objective:   Physical Exam  Constitutional: She is oriented to person, place,  and time. She appears well-developed and well-nourished.  HENT:  Head: Normocephalic and atraumatic.  Neck: Normal range of motion. Neck supple.  Cardiovascular: Normal rate and regular rhythm.   Pulmonary/Chest: Effort normal and breath sounds normal.  Musculoskeletal:  Normal Muscle Bulk and Muscle Testing Reveals:  Upper Extremities: Full ROM and Muscle Strength 5/5 Lower Extremities: Full ROM and Muscle Strength 5/5 Arises from Table with ease, using straight cane for support Antalgic Gait  Neurological: She is alert and oriented to person, place, and time.  Skin: Skin is warm and dry.  Psychiatric: She has a normal mood and affect.  Nursing note and vitals reviewed.         Assessment & Plan:  1. Severe peripheral polyneuropathy of undetermined etiology.: 07/04/2016 Refilled: Morphabond 30 mg one tablet three times a day#90 and MSIR 30mg  one tablet at HS. We will continue the opioid monitoring program, this consists of regular clinic visits, examinations, urine drug screen, pill counts as well as use of [place] Controlled Substance Reporting System. 2. Bilateral progressive upper extremity hand pain/ Polyneuropathy. Continue with current medication regime. Tight Glucose Control. Adhere to healthy diet regime.07/04/2016 3. Muscle Spasms: Continue Zanaflex. 07/04/2016 4. Depression/Anxiety: Continue Paxil and Klonopin. 07/04/2016  20 minutes of face to face patient care time was spent during this visit. All questions were encouraged and answered.   F/U in 1 month

## 2016-07-13 ENCOUNTER — Other Ambulatory Visit: Payer: Self-pay | Admitting: Internal Medicine

## 2016-07-17 ENCOUNTER — Encounter: Payer: Medicaid Other | Admitting: Internal Medicine

## 2016-07-17 ENCOUNTER — Ambulatory Visit: Payer: Medicaid Other | Admitting: Internal Medicine

## 2016-07-25 ENCOUNTER — Other Ambulatory Visit: Payer: Self-pay | Admitting: Internal Medicine

## 2016-07-31 ENCOUNTER — Other Ambulatory Visit: Payer: Self-pay | Admitting: Internal Medicine

## 2016-08-03 ENCOUNTER — Encounter: Payer: Medicaid Other | Attending: Physical Medicine & Rehabilitation | Admitting: Registered Nurse

## 2016-08-03 ENCOUNTER — Other Ambulatory Visit: Payer: Self-pay | Admitting: *Deleted

## 2016-08-03 ENCOUNTER — Encounter: Payer: Self-pay | Admitting: Registered Nurse

## 2016-08-03 VITALS — BP 134/75 | HR 89

## 2016-08-03 DIAGNOSIS — M25561 Pain in right knee: Secondary | ICD-10-CM | POA: Insufficient documentation

## 2016-08-03 DIAGNOSIS — M62838 Other muscle spasm: Secondary | ICD-10-CM | POA: Diagnosis not present

## 2016-08-03 DIAGNOSIS — F3289 Other specified depressive episodes: Secondary | ICD-10-CM

## 2016-08-03 DIAGNOSIS — F411 Generalized anxiety disorder: Secondary | ICD-10-CM | POA: Diagnosis not present

## 2016-08-03 DIAGNOSIS — Z79899 Other long term (current) drug therapy: Secondary | ICD-10-CM | POA: Diagnosis present

## 2016-08-03 DIAGNOSIS — G629 Polyneuropathy, unspecified: Secondary | ICD-10-CM

## 2016-08-03 DIAGNOSIS — G894 Chronic pain syndrome: Secondary | ICD-10-CM

## 2016-08-03 DIAGNOSIS — M25562 Pain in left knee: Secondary | ICD-10-CM | POA: Diagnosis present

## 2016-08-03 DIAGNOSIS — Z5181 Encounter for therapeutic drug level monitoring: Secondary | ICD-10-CM

## 2016-08-03 MED ORDER — MORPHINE SULFATE 30 MG PO TABS: ORAL_TABLET | ORAL | 0 refills | Status: DC

## 2016-08-03 MED ORDER — MORPHINE SULFATE ER 30 MG PO T12A: 30.0000 mg | EXTENDED_RELEASE_TABLET | Freq: Three times a day (TID) | ORAL | 0 refills | Status: DC

## 2016-08-03 NOTE — Progress Notes (Signed)
Subjective:    Patient ID: [name], female    DOB: [birth year], 46 y.o.   MRN: [id number]  HPI: [name] is a 46year old female who returns for follow up appointment for chronic pain and medication refill. She states her pain is located in her bilateral hands,bilateral lower extremities R>Land bilateral feet. She rates her pain 9. Her current exercise regime is walking.  [name] last UDS was on 05/04/2016, it was consistent.   Pain Inventory Average Pain 8 Pain Right Now 9 My pain is constant, sharp, burning, dull, stabbing and tingling  In the last 24 hours, has pain interfered with the following? General activity 9 Relation with others 9 Enjoyment of life 9 What TIME of day is your pain at its worst? morning and night Sleep (in general) Poor  Pain is worse with: walking, bending, sitting, standing and some activites Pain improves with: rest, medication and other Relief from Meds: 7  Mobility use a cane how many minutes can you walk? 5-10 ability to climb steps?  no do you drive?  no Do you have any goals in this area?  yes  Function disabled: date disabled . I need assistance with the following:  bathing, meal prep, household duties and shopping Do you have any goals in this area?  yes  Neuro/Psych weakness numbness tremor tingling trouble walking spasms dizziness depression anxiety  Prior Studies Any changes since last visit?  no  Physicians involved in your care Any changes since last visit?  no   Family History  Problem Relation Age of Onset  . Stroke Maternal Grandmother   . Colon cancer Maternal Grandmother   . Heart Problems Paternal Grandmother   . Dementia Paternal Grandmother   . Heart attack Paternal Grandfather    Social History   Social History  . Marital status: Legally Separated    Spouse name: N/A  . Number of children: N/A  . Years of education: N/A   Occupational History  . Disabled     Social History Main Topics  . Smoking status: Never Smoker  . Smokeless tobacco: Never Used  . Alcohol use No  . Drug use: No     Comment: rx opiods  . Sexual activity: Not on file   Other Topics Concern  . Not on file   Social History Narrative  . No narrative on file   Past Surgical History:  Procedure Laterality Date  . ABDOMINOPLASTY    . CHOLECYSTECTOMY     Past Medical History:  Diagnosis Date  . DEPRESSION/ANXIETY 08/08/2009  . IBS 02/02/2010  . Polyneuropathy 12/04/2010  . Renal stones   . 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  Opioid Risk Score:   Fall Risk Score:  `1  Depression screen PHQ 2/9  Depression screen [facility] 2/9 07/04/2016 08/26/2015 07/18/2015 02/14/2015 02/01/2015 12/17/2014 11/22/2014  Decreased Interest 2 2 0 2 0 3 3  Down, Depressed, Hopeless 1 1 0 3 0 3 3  PHQ - 2 Score 3 3 0 5 0 6 6  Altered sleeping - - - 2 - - -  Tired, decreased energy - - - 3 - - -  Change in appetite - - - 1 - - -  Feeling bad or failure about yourself  - - - 3 - - -  Trouble concentrating - - - 1 - - -  Moving slowly or fidgety/restless - - - 0 - - -  Suicidal thoughts - - - 1 - - -  PHQ-9 Score - - - 16 - - -  Difficult doing work/chores - - - Very difficult - - -  Some recent data might be hidden    Review of Systems  Constitutional: Positive for chills and diaphoresis.  HENT: Negative.   Eyes: Negative.   Respiratory: Negative.   Cardiovascular: Negative.   Gastrointestinal: Positive for abdominal pain, constipation, diarrhea, nausea and vomiting.  Endocrine: Negative.   Genitourinary: Negative.   Musculoskeletal: Positive for myalgias.  Skin: Positive for rash.  Allergic/Immunologic: Negative.   Neurological: Positive for dizziness, tremors, weakness and numbness.  Hematological: Negative.   Psychiatric/Behavioral: Positive for  dysphoric mood. The patient is nervous/anxious.        Objective:   Physical Exam  Constitutional: She is oriented to person, place, and time. She appears well-developed and well-nourished.  HENT:  Head: Normocephalic and atraumatic.  Neck: Normal range of motion. Neck supple.  Cardiovascular: Normal rate and regular rhythm.   Pulmonary/Chest: Effort normal and breath sounds normal.  Musculoskeletal:  Normal Muscle Bulk and Muscle Testing Reveals: Upper Extremities: Full ROM and Muscle Strength 5/5 Lower Extremities: Full ROM and Muscle Strength 5/5 Arises from Table with ease Narrow Based Gait    Neurological: She is alert and oriented to person, place, and time.  Skin: Skin is warm and dry.  Psychiatric: She has a normal mood and affect.  Nursing note and vitals reviewed.         Assessment & Plan:  1. Severe peripheral polyneuropathy of undetermined etiology.: 08/03/2016 Refilled: Morphabond30 mg one tablet three times a day#90 and MSIR 30mg  one tablet at HS. We will continue the opioid monitoring program, this consists of regular clinic visits, examinations, urine drug screen, pill counts as well as use of [place] Controlled Substance Reporting System. 2. Bilateral progressive upper extremity hand pain/ Polyneuropathy. Continue with current medication regime. Tight Glucose Control. Adhere to healthy diet regime.08/03/2016 3. Muscle Spasms: Continue Zanaflex. 08/03/2016 4. Depression/Anxiety: Continue Paxil and Klonopin. 08/03/2016  20 minutes of face to face patient care time was spent during this visit. All questions were encouraged and answered.  F/U in 1 month

## 2016-08-09 LAB — 6-ACETYLMORPHINE,TOXASSURE ADD
6-ACETYLMORPHINE: NEGATIVE
6-acetylmorphine: NOT DETECTED ng/mg creat

## 2016-08-09 LAB — TOXASSURE SELECT,+ANTIDEPR,UR

## 2016-08-10 ENCOUNTER — Telehealth: Payer: Self-pay | Admitting: *Deleted

## 2016-08-10 NOTE — Telephone Encounter (Signed)
Urine drug screen for this encounter is consistent for prescribed medication 

## 2016-08-17 ENCOUNTER — Other Ambulatory Visit: Payer: Self-pay | Admitting: Registered Nurse

## 2016-08-17 ENCOUNTER — Other Ambulatory Visit: Payer: Self-pay | Admitting: Internal Medicine

## 2016-08-23 ENCOUNTER — Ambulatory Visit: Payer: Medicaid Other | Admitting: Internal Medicine

## 2016-08-27 ENCOUNTER — Other Ambulatory Visit: Payer: Self-pay | Admitting: Family Medicine

## 2016-08-27 ENCOUNTER — Other Ambulatory Visit: Payer: Self-pay | Admitting: Internal Medicine

## 2016-08-30 ENCOUNTER — Encounter: Payer: Self-pay | Admitting: Physical Medicine & Rehabilitation

## 2016-08-30 ENCOUNTER — Encounter: Payer: Medicaid Other | Attending: Physical Medicine & Rehabilitation

## 2016-08-30 ENCOUNTER — Ambulatory Visit (HOSPITAL_BASED_OUTPATIENT_CLINIC_OR_DEPARTMENT_OTHER): Payer: Medicaid Other | Admitting: Physical Medicine & Rehabilitation

## 2016-08-30 VITALS — BP 147/88 | HR 87

## 2016-08-30 DIAGNOSIS — G894 Chronic pain syndrome: Secondary | ICD-10-CM | POA: Insufficient documentation

## 2016-08-30 DIAGNOSIS — Z5181 Encounter for therapeutic drug level monitoring: Secondary | ICD-10-CM | POA: Insufficient documentation

## 2016-08-30 DIAGNOSIS — M25561 Pain in right knee: Secondary | ICD-10-CM | POA: Diagnosis present

## 2016-08-30 DIAGNOSIS — M79641 Pain in right hand: Secondary | ICD-10-CM | POA: Diagnosis not present

## 2016-08-30 DIAGNOSIS — Z79899 Other long term (current) drug therapy: Secondary | ICD-10-CM | POA: Diagnosis present

## 2016-08-30 DIAGNOSIS — M25562 Pain in left knee: Secondary | ICD-10-CM | POA: Insufficient documentation

## 2016-08-30 DIAGNOSIS — G8929 Other chronic pain: Secondary | ICD-10-CM | POA: Diagnosis not present

## 2016-08-30 DIAGNOSIS — E1140 Type 2 diabetes mellitus with diabetic neuropathy, unspecified: Secondary | ICD-10-CM

## 2016-08-30 MED ORDER — MORPHINE SULFATE 30 MG PO TABS: ORAL_TABLET | ORAL | 0 refills | Status: DC

## 2016-08-30 MED ORDER — MORPHINE SULFATE ER 30 MG PO T12A: 30.0000 mg | EXTENDED_RELEASE_TABLET | Freq: Three times a day (TID) | ORAL | 0 refills | Status: DC

## 2016-08-30 NOTE — Patient Instructions (Signed)
Increased risk of side effects and addiction when we exceed 90 mg of morphine per day, Your current dose is 120 mg of morphine per day. This is considered high dose  Ask primary care physician if you can be converted from Paxil to Cymbalta

## 2016-08-30 NOTE — Progress Notes (Signed)
Subjective:    Patient ID: [name], female    DOB: 1970-07-07, 46 y.o.   MRN: [id number]  HPI  46 year old female with history of polyneuropathy. which  was diagnosed prior to the onset of her diabetes. Additional workup by 2 neurologists has not revealed any other etiologies    Patient complains of increasing bilateral hand pain   described as numbness and burning but also feels like deeper bone  pain  No trauma to the hand area. Reviewed EMG/NCV which demonstrated polyneuropathy affecting the upper extremities. Prior carpal tunnel injection on the right, performed 6 months ago was not helpful.   Patient also asking about increase in pain medications. We reviewed that she is already on high dose morphine, 120 mg per day. Discussed that with increased doses there is increased risk of side effects as well as addiction.  Pain Inventory Average Pain 7 Pain Right Now 7 My pain is constant, sharp, burning, dull, stabbing, tingling and aching  In the last 24 hours, has pain interfered with the following? General activity 9 Relation with others 9 Enjoyment of life 9 What TIME of day is your pain at its worst? evening Sleep (in general) Poor  Pain is worse with: walking, bending, standing and some activites Pain improves with: rest and medication Relief from Meds: 5  Mobility walk without assistance walk with assistance use a cane ability to climb steps?  no do you drive?  no  Function disabled: date disabled . I need assistance with the following:  bathing, meal prep, household duties and shopping  Neuro/Psych weakness numbness tremor tingling trouble walking spasms dizziness depression anxiety  Prior Studies Any changes since last visit?  no  Physicians involved in your care Any changes since last visit?  no   Family History  Problem Relation Age of Onset  . Stroke Maternal Grandmother   . Colon cancer Maternal Grandmother   . Heart Problems  Paternal Grandmother   . Dementia Paternal Grandmother   . Heart attack Paternal Grandfather    Social History   Social History  . Marital status: Legally Separated    Spouse name: N/A  . Number of children: N/A  . Years of education: N/A   Occupational History  . Disabled    Social History Main Topics  . Smoking status: Never Smoker  . Smokeless tobacco: Never Used  . Alcohol use No  . Drug use: No     Comment: rx opiods  . Sexual activity: Not on file   Other Topics Concern  . Not on file   Social History Narrative  . No narrative on file   Past Surgical History:  Procedure Laterality Date  . ABDOMINOPLASTY    . CHOLECYSTECTOMY     Past Medical History:  Diagnosis Date  . DEPRESSION/ANXIETY 08/08/2009  . IBS 02/02/2010  . Polyneuropathy 12/04/2010  . Renal stones   . 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) 08/08/2009   BP (!) 147/88   Pulse 87   SpO2 97%   Opioid Risk Score:   Fall Risk Score:  `1  Depression screen PHQ 2/9  Depression screen [facility] 2/9 07/04/2016 08/26/2015 07/18/2015 02/14/2015 02/01/2015 12/17/2014 11/22/2014  Decreased Interest 2 2 0 2 0 3 3  Down, Depressed, Hopeless 1 1 0 3 0 3 3  PHQ - 2 Score 3 3 0 5 0 6 6  Altered sleeping - - - 2 - - -  Tired,  decreased energy - - - 3 - - -  Change in appetite - - - 1 - - -  Feeling bad or failure about yourself  - - - 3 - - -  Trouble concentrating - - - 1 - - -  Moving slowly or fidgety/restless - - - 0 - - -  Suicidal thoughts - - - 1 - - -  PHQ-9 Score - - - 16 - - -  Difficult doing work/chores - - - Very difficult - - -  Some recent data might be hidden    Review of Systems  Constitutional: Positive for diaphoresis.  HENT: Negative.   Eyes: Negative.   Respiratory: Negative.   Cardiovascular: Negative.   Gastrointestinal: Positive for abdominal pain, constipation, diarrhea and nausea.  Endocrine: Negative.     Genitourinary: Negative.   Musculoskeletal: Negative.   Skin: Negative.   Allergic/Immunologic: Negative.   Neurological: Negative.   Hematological: Negative.   Psychiatric/Behavioral: Negative.   All other systems reviewed and are negative.      Objective:   Physical Exam  Constitutional: She is oriented to person, place, and time. She appears well-developed and well-nourished.  HENT:  Head: Normocephalic and atraumatic.  Eyes: Conjunctivae and EOM are normal. Pupils are equal, round, and reactive to light.  Neck: Normal range of motion.  Neurological: She is alert and oriented to person, place, and time.  Absent sensation to pinprick and proprioception in the feet, does have partial sensation to pinprick at the anterior tibial area just below the knee. Above the patella. There is normal sensation to pinprick. Proprioception intact in both upper limbs at the thumbs. Pinprick sensation is reduced in the fingers, partial sensation in the palms and normal sensation at the wrists bilaterally. Motor strength is 5/5 bilateral deltoid, biceps, triceps, grip 5/5 bilateral hip flexor, knee extensor 4/5, bilateral ankle dorsiflexor 3 minus at bilateral toe flexors and extensors.  Psychiatric: She has a normal mood and affect.  Nursing note and vitals reviewed.  Musculoskeletal, hands have no evidence of joint swelling at the DIP or PIP or MCPs. There is full range of motion at the fingers and wrist. There is no evidence or joint swelling at the wrist.Joint deformities. No skin lesions.      Assessment & Plan:    1. Painful diabetic polyneuropathy, progressive.  She is having increased sensory symptoms in the upper extremities. No motor symptoms in the upper extremities. Lower extremity developing weakness distally at the toe flexors and extensors and to a lesser extent at the ankle. We discussed treatment options. She is already tried and failed gabapentin and Lyrica. She has been on  methadone in the past, but risks outweigh benefits. Continue morphine current dose 30 mg extended release abuse to turn formulation 3 times a day as well as morphine sulfate immediate release 30 mg once per day. Do not recommend any dosage escalation. At this time. Currently on Paxil per PCP, may benefit from switch to Cymbalta, but she will discuss this with her PCP. Nurse practitioner. Visit 1 month  Continue opioid monitoring program. This consists of regular clinic visits, examinations, urine drug screen, pill counts as well as use of [place] controlled substance reporting System.  In terms of her hand pain. It is possible she may be developing some early osteoarthritis without obvious joint  deformities. Right hand x-ray

## 2016-09-05 ENCOUNTER — Telehealth: Payer: Self-pay | Admitting: *Deleted

## 2016-09-05 ENCOUNTER — Telehealth: Payer: Self-pay | Admitting: Physical Medicine & Rehabilitation

## 2016-09-05 MED ORDER — MORPHINE SULFATE ER 30 MG PO T12A: 30.0000 mg | EXTENDED_RELEASE_TABLET | Freq: Three times a day (TID) | ORAL | 0 refills | Status: DC

## 2016-09-05 NOTE — Telephone Encounter (Signed)
[name] is having problems getting the morphabond RX from her normal [facility] and the one she was able to get it from only dispensed 10 tablets because they did not have it in stock.  I spoke with the pharmacist and if she gets it 2 months in a row it will auto reorder and stock will be kept but they have to let the pharmacy know a head of time until they have filled twice.  We will have to reissue a prescription for #90

## 2016-09-12 ENCOUNTER — Other Ambulatory Visit: Payer: Self-pay | Admitting: Physical Medicine & Rehabilitation

## 2016-09-12 ENCOUNTER — Other Ambulatory Visit: Payer: Self-pay | Admitting: Internal Medicine

## 2016-09-12 NOTE — Telephone Encounter (Signed)
Pt needs a refill on enalapril. Pt uses [facility]. Scientist, product/process development. ep

## 2016-09-17 ENCOUNTER — Ambulatory Visit (INDEPENDENT_AMBULATORY_CARE_PROVIDER_SITE_OTHER): Payer: Medicaid Other | Admitting: Internal Medicine

## 2016-09-17 ENCOUNTER — Other Ambulatory Visit: Payer: Self-pay | Admitting: Certified Nurse Midwife

## 2016-09-17 ENCOUNTER — Encounter: Payer: Self-pay | Admitting: Internal Medicine

## 2016-09-17 VITALS — BP 140/92 | HR 95 | Temp 98.9°F | Ht 72.0 in | Wt 284.8 lb

## 2016-09-17 DIAGNOSIS — E785 Hyperlipidemia, unspecified: Secondary | ICD-10-CM | POA: Diagnosis not present

## 2016-09-17 DIAGNOSIS — G629 Polyneuropathy, unspecified: Secondary | ICD-10-CM

## 2016-09-17 DIAGNOSIS — H9313 Tinnitus, bilateral: Secondary | ICD-10-CM

## 2016-09-17 DIAGNOSIS — E119 Type 2 diabetes mellitus without complications: Secondary | ICD-10-CM | POA: Diagnosis present

## 2016-09-17 DIAGNOSIS — F341 Dysthymic disorder: Secondary | ICD-10-CM

## 2016-09-17 DIAGNOSIS — I10 Essential (primary) hypertension: Secondary | ICD-10-CM | POA: Diagnosis not present

## 2016-09-17 DIAGNOSIS — E781 Pure hyperglyceridemia: Secondary | ICD-10-CM | POA: Diagnosis not present

## 2016-09-17 LAB — POCT GLYCOSYLATED HEMOGLOBIN (HGB A1C): HEMOGLOBIN A1C: 7.4

## 2016-09-17 MED ORDER — PRAVASTATIN SODIUM 40 MG PO TABS: 40.0000 mg | ORAL_TABLET | Freq: Every evening | ORAL | 3 refills | Status: DC

## 2016-09-17 MED ORDER — ENALAPRIL MALEATE 10 MG PO TABS: 10.0000 mg | ORAL_TABLET | Freq: Every day | ORAL | 3 refills | Status: DC

## 2016-09-17 MED ORDER — GLIPIZIDE 10 MG PO TABS: 10.0000 mg | ORAL_TABLET | Freq: Two times a day (BID) | ORAL | 0 refills | Status: DC

## 2016-09-17 MED ORDER — PAROXETINE HCL 40 MG PO TABS: 40.0000 mg | ORAL_TABLET | Freq: Every day | ORAL | 3 refills | Status: DC

## 2016-09-17 MED ORDER — METFORMIN HCL 1000 MG PO TABS: 1000.0000 mg | ORAL_TABLET | Freq: Two times a day (BID) | ORAL | 0 refills | Status: DC

## 2016-09-17 NOTE — Patient Instructions (Signed)
[name],  I have changed your enalapril to 10 mg daily from 20 mg daily to see if this helps your sensation of dizziness. If you check your blood pressure and consistently see readings above 140/90, then we should increase again.  I will refer you to an Audiologist to check hearing to see if that is causing the ringing in your ears.   If you have sensation of room spinning, please call, and I can have you get vestibular physical therapy.  Let me know if you have any questions about your labs. I'm checking kidney function and your electrolytes and will try to add the lipid panel.  You are due for an eye exam.  Best, Dr. Ola Spurr

## 2016-09-17 NOTE — Progress Notes (Signed)
[facility] Progress Note  Subjective:   [name] is a 46 y.o. female with a history of HTN, polyneuropathy, chronic pain, T2DM, morbid obesity, here for an annual exam.   Gyn concerns/Preventative healthcare  Last menstrual period: Patient's last menstrual period was 08/22/2016 (exact date).  Regular periods: yes  Sexually active: no  Breast mass or concerns: No  Last pap smear: 07/13/15  Due for: Opthalmology Exam, foot exam  T2DM: - Taking metformin 1000 mg BID and glipizide 10 mg BID  HTN: - has been out of enalapril for 2 months - complains of ongoing dizziness that is worse when she stands and says BP tends to be elevated when she comes to doctor - does not have BP cuff to check but her mother does  Tinnitus:  - Has intermittent grinding sounds in ears, occurs bilaterally - No jaw pain or blurry vision - Had 2 month period during which she struggled with vertigo-- was worsened by head movements -- but resolved on its own - Would like ears checked as has been told she has fluid behind her ears in the past  Painful diabetic polyneuropathy: - follows with pain management clinic; currently on morphine after failing gabapentin and lyrica - had discussed switching from paxil to cymbalta but patient thinks paxil at least helping with OCD symptoms now and wants to get off SSRIs in the near future - using a cane to help stability  Depression/Anxiety: - Not regularly seeing a therapist because difficult to leave house - Ultimately wants to wean off her antidepressant but not at this time - Also on klonopin 0.5 mg prescribed by [facility]NR  HLD: - Taking pravastatin 40 mg  PMH, Surgical Hx, Family Hx, Social History reviewed and updated as below.  Past Medical History:  Diagnosis Date  . DEPRESSION/ANXIETY 08/08/2009  . IBS 02/02/2010  . Polyneuropathy 12/04/2010  . Renal stones   . Type II or unspecified type diabetes mellitus with neurological  manifestations, 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) 08/08/2009   Past Surgical History:  Procedure Laterality Date  . ABDOMINOPLASTY    . CHOLECYSTECTOMY     Family History  Problem Relation Age of Onset  . Stroke Maternal Grandmother   . Colon cancer Maternal Grandmother   . Heart Problems Paternal Grandmother   . Dementia Paternal Grandmother   . Heart attack Paternal Grandfather    Social: - Does not drink alcohol, never smoker, does not use illegal drugs - Feels safe in her relationships - Does not regularly exercise. Does not feel her balance is good enough for most exercises and does not like to leave the house. Has been considering recumbent bike.   Review of Systems:  Reports ringing in ears, trouble seeing, palpitations, nausea, diarrhea, constipation, abdominal pain, muscle cramps, joint aches, headaches, weakness, numbness, dizziness, sadness, anxiety, stress, memory problems   Objective:  Blood pressure (!) 140/92, pulse 95, temperature 98.9 F (37.2 C), temperature source Oral, height 6' (1.829 m), weight 284 lb 12.8 oz (129.2 kg), last menstrual period 08/22/2016, SpO2 96 %. Body mass index is 38.63 kg/m. Exam: General: obese female, in NAD HEENT: NCAT. Normal TMs bilaterally.  Cardiovascular: RRR. No murmurs, rubs, or gallops. Respiratory: CTAB. No rales, rhonchi, or wheeze. Abdomen: soft, nontender, nondistended. Extremities: No LE edema. Palpable DP/PT pulses bilaterally.  Skin: Soft, flesh-colored mass of L tibia and R inner thigh without TTP (chronic).  Neuro: Numbness of feet to just above ankle. Normal gait.     Chemistry  Component Value Date/Time   NA 141 09/17/2016 1655   NA 139 11/10/2013 1718   K 4.8 09/17/2016 1655   K 3.9 11/10/2013 1718   CL 97 09/17/2016 1655   CL 102 11/10/2013 1718   CO2 23 09/17/2016 1655   CO2 21 11/10/2013 1718   BUN 7 09/17/2016 1655   BUN 10 11/10/2013 1718   CREATININE 0.52 (L) 09/17/2016 1655   CREATININE  0.49 (L) 02/01/2015 1631      Component Value Date/Time   CALCIUM 10.0 09/17/2016 1655   CALCIUM 8.6 11/10/2013 1718   ALKPHOS 54 09/17/2016 1655   AST 27 09/17/2016 1655   ALT 26 09/17/2016 1655   BILITOT 0.2 09/17/2016 1655     Lab Results  Component Value Date   HGBA1C 7.4 09/17/2016   Depression screen PHQ 2/9 09/17/2016 09/17/2016 07/04/2016 08/26/2015 07/18/2015  Decreased Interest 3 3 2 2  0  Down, Depressed, Hopeless 2 3 1 1  0  PHQ - 2 Score 5 6 3 3  0  Altered sleeping 3 - - - -  Tired, decreased energy 3 - - - -  Change in appetite 0 - - - -  Feeling bad or failure about yourself  3 - - - -  Trouble concentrating 3 - - - -  Moving slowly or fidgety/restless 0 - - - -  Suicidal thoughts 1 - - - -  PHQ-9 Score 18 - - - -  Difficult doing work/chores - - - - -  Some recent data might be hidden    Assessment/Plan:   DM2 (diabetes mellitus, type 2) - A1c improved from last year at 7.4 compared to 8.2 - Continue metformin 1000 mg BID and glipizide 10 mg BID - Encouraged patient to find exercise regimen she can tolerate. Says she may try recumbent bike.  - Patient to schedule appointment with Ophthalmology  Essential hypertension, benign - BP only mildly elevated despite being off antihypertensive medication for over 2 months - Continue enalapril at reduced dose of 10 mg to see if this helps with sensation of dizziness and for continued kidney protective effect  Tinnitus of both ears - Will refer to Audiology to rule-out age-related hearing loss - Advised patient that if vertigo sensation returns could benefit from vestibular PT or might require brain imaging (MRI) if is sustained as she has described previously  Polyneuropathy - Managed by [facility].  - Patient not interested in switching from paxil to cymbalta at this time.   DEPRESSION/ANXIETY - Scores in moderately severe range on PHQ9. No active SI/HI. - Encouraged more consistent follow-up with  counseling, trying to incorporate exercise into her life, and remaining on SSRI at this time  Hypertriglyceridemia - Tolerating pravastatin. - Will check lipid panel (has been fasting)  Follow-up prn if BP not at goal of < 140/90, leg masses change (previously referred to dermatology for biopsy but did make appointment), or if Audiology referral does not shed light on origin of tinnitus for further workup.  Olene Floss, MD [place], PGY-2

## 2016-09-18 LAB — COMPREHENSIVE METABOLIC PANEL
A/G RATIO: 1.4 (ref 1.2–2.2)
ALBUMIN: 4.2 g/dL (ref 3.5–5.5)
ALK PHOS: 54 IU/L (ref 39–117)
ALT: 26 IU/L (ref 0–32)
AST: 27 IU/L (ref 0–40)
BILIRUBIN TOTAL: 0.2 mg/dL (ref 0.0–1.2)
BUN / CREAT RATIO: 13 (ref 9–23)
BUN: 7 mg/dL (ref 6–24)
CHLORIDE: 97 mmol/L (ref 96–106)
CO2: 23 mmol/L (ref 20–29)
Calcium: 10 mg/dL (ref 8.7–10.2)
Creatinine, Ser: 0.52 mg/dL — ABNORMAL LOW (ref 0.57–1.00)
GFR calc Af Amer: 131 mL/min/{1.73_m2} (ref >59)
GFR calc non Af Amer: 113 mL/min/{1.73_m2} (ref >59)
GLOBULIN, TOTAL: 2.9 g/dL (ref 1.5–4.5)
Glucose: 177 mg/dL — ABNORMAL HIGH (ref 65–99)
Potassium: 4.8 mmol/L (ref 3.5–5.2)
SODIUM: 141 mmol/L (ref 134–144)
Total Protein: 7.1 g/dL (ref 6.0–8.5)

## 2016-09-20 LAB — LIPID PANEL
Chol/HDL Ratio: 5.8 ratio — ABNORMAL HIGH (ref 0.0–4.4)
Cholesterol, Total: 191 mg/dL (ref 100–199)
HDL: 33 mg/dL — AB (ref >39)
Triglycerides: 816 mg/dL (ref 0–149)

## 2016-09-20 LAB — SPECIMEN STATUS REPORT

## 2016-09-22 DIAGNOSIS — H9313 Tinnitus, bilateral: Secondary | ICD-10-CM | POA: Insufficient documentation

## 2016-09-22 NOTE — Assessment & Plan Note (Signed)
-   BP only mildly elevated despite being off antihypertensive medication for over 2 months - Continue enalapril at reduced dose of 10 mg to see if this helps with sensation of dizziness and for continued kidney protective effect

## 2016-09-22 NOTE — Assessment & Plan Note (Addendum)
-   Tolerating pravastatin. - Will check lipid panel (has been fasting)

## 2016-09-22 NOTE — Assessment & Plan Note (Addendum)
-   Will refer to Audiology to rule-out age-related hearing loss - Advised patient that if vertigo sensation returns could benefit from vestibular PT or might require brain imaging (MRI) if is sustained as she has described previously

## 2016-09-22 NOTE — Assessment & Plan Note (Signed)
-   Scores in moderately severe range on PHQ9. No active SI/HI. - Encouraged more consistent follow-up with counseling, trying to incorporate exercise into her life, and remaining on SSRI at this time

## 2016-09-22 NOTE — Assessment & Plan Note (Signed)
-   Managed by [facility].  - Patient not interested in switching from paxil to cymbalta at this time.

## 2016-09-22 NOTE — Assessment & Plan Note (Addendum)
-   A1c improved from last year at 7.4 compared to 8.2 - Continue metformin 1000 mg BID and glipizide 10 mg BID - Encouraged patient to find exercise regimen she can tolerate. Says she may try recumbent bike.  - Patient to schedule appointment with Ophthalmology

## 2016-09-24 ENCOUNTER — Encounter: Payer: Self-pay | Admitting: Internal Medicine

## 2016-10-04 ENCOUNTER — Encounter: Payer: Medicaid Other | Attending: Physical Medicine & Rehabilitation | Admitting: Registered Nurse

## 2016-10-04 ENCOUNTER — Encounter: Payer: Self-pay | Admitting: Registered Nurse

## 2016-10-04 VITALS — BP 141/87 | HR 95

## 2016-10-04 DIAGNOSIS — M25561 Pain in right knee: Secondary | ICD-10-CM | POA: Insufficient documentation

## 2016-10-04 DIAGNOSIS — Z79899 Other long term (current) drug therapy: Secondary | ICD-10-CM | POA: Diagnosis present

## 2016-10-04 DIAGNOSIS — G629 Polyneuropathy, unspecified: Secondary | ICD-10-CM | POA: Diagnosis not present

## 2016-10-04 DIAGNOSIS — G894 Chronic pain syndrome: Secondary | ICD-10-CM

## 2016-10-04 DIAGNOSIS — M25562 Pain in left knee: Secondary | ICD-10-CM | POA: Diagnosis present

## 2016-10-04 DIAGNOSIS — F411 Generalized anxiety disorder: Secondary | ICD-10-CM | POA: Diagnosis not present

## 2016-10-04 DIAGNOSIS — F3289 Other specified depressive episodes: Secondary | ICD-10-CM | POA: Diagnosis not present

## 2016-10-04 DIAGNOSIS — Z5181 Encounter for therapeutic drug level monitoring: Secondary | ICD-10-CM | POA: Diagnosis present

## 2016-10-04 DIAGNOSIS — E1140 Type 2 diabetes mellitus with diabetic neuropathy, unspecified: Secondary | ICD-10-CM

## 2016-10-04 MED ORDER — MORPHINE SULFATE ER 30 MG PO T12A: 30.0000 mg | EXTENDED_RELEASE_TABLET | Freq: Three times a day (TID) | ORAL | 0 refills | Status: DC

## 2016-10-04 MED ORDER — MORPHINE SULFATE 30 MG PO TABS: ORAL_TABLET | ORAL | 0 refills | Status: DC

## 2016-10-04 NOTE — Progress Notes (Signed)
Subjective:    Patient ID: [name], female    DOB: 08-24-1970, 46 y.o.   MRN: [id number]  HPI: [name] is a 46year old female who returns for follow up appointment for chronic pain and medication refill. She states her pain is located in her bilateral hands,bilateral lower extremities R>Land bilateral feet. She rates her pain 8. Her current exercise regime is walking.  [name] last UDS was on 08/03/2016, it was consistent.  Pain Inventory Average Pain 6 Pain Right Now 8 My pain is constant, sharp, burning, dull, stabbing, tingling and aching  In the last 24 hours, has pain interfered with the following? General activity 10 Relation with others 10 Enjoyment of life 10 What TIME of day is your pain at its worst? morning and night Sleep (in general) Poor  Pain is worse with: some activites Pain improves with: medication Relief from Meds: no ans  Mobility walk without assistance walk with assistance use a cane how many minutes can you walk? 5 ability to climb steps?  no do you drive?  no  Function disabled: date disabled . Do you have any goals in this area?  yes  Neuro/Psych weakness numbness tremor tingling trouble walking spasms dizziness confusion depression anxiety  Prior Studies Any changes since last visit?  no  Physicians involved in your care Any changes since last visit?  no   Family History  Problem Relation Age of Onset  . Stroke Maternal Grandmother   . Colon cancer Maternal Grandmother   . Heart Problems Paternal Grandmother   . Dementia Paternal Grandmother   . Heart attack Paternal Grandfather    Social History   Social History  . Marital status: Legally Separated    Spouse name: N/A  . Number of children: N/A  . Years of education: N/A   Occupational History  . Disabled    Social History Main Topics  . Smoking status: Never Smoker  . Smokeless tobacco: Never Used  . Alcohol use No  .  Drug use: No     Comment: rx opiods  . Sexual activity: Not Asked   Other Topics Concern  . None   Social History Narrative  . None   Past Surgical History:  Procedure Laterality Date  . ABDOMINOPLASTY    . CHOLECYSTECTOMY     Past Medical History:  Diagnosis Date  . DEPRESSION/ANXIETY 08/08/2009  . IBS 02/02/2010  . Polyneuropathy 12/04/2010  . Renal stones   . 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) 08/08/2009   BP (!) 141/87 (BP Location: Left Arm, Patient Position: Sitting, Cuff Size: Large)   Pulse 95   SpO2 98%   Opioid Risk Score:   Fall Risk Score:  `1  Depression screen PHQ 2/9  Depression screen [facility] 2/9 10/04/2016 09/17/2016 09/17/2016 07/04/2016 08/26/2015 07/18/2015 02/14/2015  Decreased Interest 3 3 3 2 2  0 2  Down, Depressed, Hopeless 3 2 3 1 1  0 3  PHQ - 2 Score 6 5 6 3 3  0 5  Altered sleeping - 3 - - - - 2  Tired, decreased energy - 3 - - - - 3  Change in appetite - 0 - - - - 1  Feeling bad or failure about yourself  - 3 - - - - 3  Trouble concentrating - 3 - - - - 1  Moving slowly or fidgety/restless - 0 - - - - 0  Suicidal thoughts -  1 - - - - 1  PHQ-9 Score - 18 - - - - 16  Difficult doing work/chores - - - - - - Very difficult  Some recent data might be hidden    Review of Systems  Constitutional: Negative.   HENT: Negative.   Eyes: Negative.   Respiratory: Negative.   Cardiovascular: Negative.   Gastrointestinal: Negative.   Endocrine: Negative.   Genitourinary: Negative.   Musculoskeletal: Negative.   Skin: Negative.   Allergic/Immunologic: Negative.   Neurological: Negative.   Hematological: Negative.   Psychiatric/Behavioral: Negative.   All other systems reviewed and are negative.      Objective:   Physical Exam  Constitutional: She is oriented to person, place, and time. She appears well-developed and well-nourished.  HENT:  Head: Normocephalic and  atraumatic.  Neck: Normal range of motion. Neck supple.  Cardiovascular: Normal rate and regular rhythm.   Pulmonary/Chest: Effort normal and breath sounds normal.  Musculoskeletal:  Normal Muscle Bulk and Muscle Testing Reveals:  Upper Extremities: Full ROM and Muscle Strength 5/5  Lower Extremities: Full ROM and Muscle Strength 5/5 Arises from Table with ease  Narrow Based Gait  Neurological: She is alert and oriented to person, place, and time.  Skin: Skin is warm and dry.  Psychiatric: She has a normal mood and affect.  Nursing note and vitals reviewed.         Assessment & Plan:  1. Severe peripheral polyneuropathy of undetermined etiology.: 10/04/2016 Refilled: Morphabond30 mg one tablet three times a day#90 and MSIR 30mg  one tablet at HS. We will continue the opioid monitoring program, this consists of regular clinic visits, examinations, urine drug screen, pill counts as well as use of [place] Controlled Substance Reporting System. 2. Bilateral progressive upper extremity hand pain/ Polyneuropathy. Continue with current medication regime. Tight Glucose Control. Adhere to healthy diet regime.10/04/2016 3. Muscle Spasms: Continue Zanaflex. 10/04/2016 4. Depression/Anxiety: Continue Paxil and Klonopin. 10/04/2016  20 minutes of face to face patient care time was spent during this visit. All questions were encouraged and answered.  F/U in 1 month

## 2016-10-15 ENCOUNTER — Other Ambulatory Visit: Payer: Self-pay | Admitting: Registered Nurse

## 2016-10-22 ENCOUNTER — Telehealth: Payer: Self-pay | Admitting: Registered Nurse

## 2016-10-22 ENCOUNTER — Encounter: Payer: Self-pay | Admitting: Registered Nurse

## 2016-10-22 ENCOUNTER — Encounter: Payer: Self-pay | Admitting: Internal Medicine

## 2016-10-22 NOTE — Telephone Encounter (Signed)
Received a message from Ms. Cunnigham  Stating [facility] dispense MS Contin and not Morphabond. NCCSR was reviewed. MS Contin was dispensed  Placed a call to [facility] on [address] spoke to [place]. Suezanne Jacquet states he will place it in the Centralizing Error Reporting System.

## 2016-10-23 ENCOUNTER — Other Ambulatory Visit: Payer: Self-pay | Admitting: Internal Medicine

## 2016-10-23 MED ORDER — ATORVASTATIN CALCIUM 80 MG PO TABS: 80.0000 mg | ORAL_TABLET | Freq: Every day | ORAL | 3 refills | Status: DC

## 2016-10-30 ENCOUNTER — Encounter: Payer: Self-pay | Admitting: Registered Nurse

## 2016-10-30 ENCOUNTER — Telehealth: Payer: Self-pay | Admitting: Registered Nurse

## 2016-10-30 ENCOUNTER — Encounter: Payer: Medicaid Other | Attending: Physical Medicine & Rehabilitation | Admitting: Registered Nurse

## 2016-10-30 VITALS — BP 141/81 | HR 85

## 2016-10-30 DIAGNOSIS — M25561 Pain in right knee: Secondary | ICD-10-CM | POA: Diagnosis present

## 2016-10-30 DIAGNOSIS — F3289 Other specified depressive episodes: Secondary | ICD-10-CM

## 2016-10-30 DIAGNOSIS — Z79899 Other long term (current) drug therapy: Secondary | ICD-10-CM

## 2016-10-30 DIAGNOSIS — E1140 Type 2 diabetes mellitus with diabetic neuropathy, unspecified: Secondary | ICD-10-CM | POA: Diagnosis not present

## 2016-10-30 DIAGNOSIS — F411 Generalized anxiety disorder: Secondary | ICD-10-CM | POA: Diagnosis not present

## 2016-10-30 DIAGNOSIS — G629 Polyneuropathy, unspecified: Secondary | ICD-10-CM

## 2016-10-30 DIAGNOSIS — M25562 Pain in left knee: Secondary | ICD-10-CM | POA: Insufficient documentation

## 2016-10-30 DIAGNOSIS — Z5181 Encounter for therapeutic drug level monitoring: Secondary | ICD-10-CM

## 2016-10-30 DIAGNOSIS — G894 Chronic pain syndrome: Secondary | ICD-10-CM

## 2016-10-30 MED ORDER — MORPHINE SULFATE 30 MG PO TABS: ORAL_TABLET | ORAL | 0 refills | Status: DC

## 2016-10-30 MED ORDER — MORPHINE SULFATE ER 30 MG PO T12A: 30.0000 mg | EXTENDED_RELEASE_TABLET | Freq: Three times a day (TID) | ORAL | 0 refills | Status: DC

## 2016-10-30 NOTE — Progress Notes (Signed)
Subjective:    Patient ID: [name], female    DOB: [birth year], 46 y.o.   MRN: [id number]  HPI: [name] is a 46year old female who returns for follow up appointment for chronic pain and medication refill. She states her pain is located in her bilateral hands, bilateral flank pain,bilateral lower extremities R>Land bilateral feet. She rates her pain 8. Her current exercise regime is walking.  [name] last UDS was on 08/03/2016, it was consistent.  Pain Inventory Average Pain 7 Pain Right Now 8 My pain is intermittent, constant, sharp, burning, dull, stabbing, tingling and aching  In the last 24 hours, has pain interfered with the following? General activity 9 Relation with others 9 Enjoyment of life 9 What TIME of day is your pain at its worst? night Sleep (in general) Poor  Pain is worse with: walking, bending and standing Pain improves with: medication and injections Relief from Meds: 6  Mobility walk without assistance  Function disabled: date disabled .  Neuro/Psych weakness numbness tingling trouble walking spasms dizziness depression anxiety  Prior Studies Any changes since last visit?  no  Physicians involved in your care Any changes since last visit?  no   Family History  Problem Relation Age of Onset  . Stroke Maternal Grandmother   . Colon cancer Maternal Grandmother   . Heart Problems Paternal Grandmother   . Dementia Paternal Grandmother   . Heart attack Paternal Grandfather    Social History   Social History  . Marital status: Legally Separated    Spouse name: N/A  . Number of children: N/A  . Years of education: N/A   Occupational History  . Disabled    Social History Main Topics  . Smoking status: Never Smoker  . Smokeless tobacco: Never Used  . Alcohol use No  . Drug use: No     Comment: rx opiods  . Sexual activity: Not Asked   Other Topics Concern  . None   Social History Narrative    . None   Past Surgical History:  Procedure Laterality Date  . ABDOMINOPLASTY    . CHOLECYSTECTOMY     Past Medical History:  Diagnosis Date  . DEPRESSION/ANXIETY 08/08/2009  . IBS 02/02/2010  . Polyneuropathy 12/04/2010  . Renal stones   . 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) 08/08/2009   BP (!) 141/81 (BP Location: Left Arm, Patient Position: Sitting, Cuff Size: Normal)   Pulse 85   SpO2 98%   Opioid Risk Score:   Fall Risk Score:  `1  Depression screen PHQ 2/9  Depression screen [facility] 2/9 10/04/2016 09/17/2016 09/17/2016 07/04/2016 08/26/2015 07/18/2015 02/14/2015  Decreased Interest 3 3 3 2 2  0 2  Down, Depressed, Hopeless 3 2 3 1 1  0 3  PHQ - 2 Score 6 5 6 3 3  0 5  Altered sleeping - 3 - - - - 2  Tired, decreased energy - 3 - - - - 3  Change in appetite - 0 - - - - 1  Feeling bad or failure about yourself  - 3 - - - - 3  Trouble concentrating - 3 - - - - 1  Moving slowly or fidgety/restless - 0 - - - - 0  Suicidal thoughts - 1 - - - - 1  PHQ-9 Score - 18 - - - - 16  Difficult doing work/chores - - - - - - Very difficult  Some  recent data might be hidden     Review of Systems  Constitutional: Positive for diaphoresis.  HENT: Negative.   Eyes: Negative.   Respiratory: Negative.   Cardiovascular: Negative.   Gastrointestinal: Positive for abdominal pain, constipation, diarrhea and nausea.  Endocrine: Negative.   Genitourinary: Negative.   Musculoskeletal: Negative.   Skin: Negative.   Allergic/Immunologic: Negative.   Neurological: Negative.   Hematological: Negative.   Psychiatric/Behavioral: Negative.   All other systems reviewed and are negative.      Objective:   Physical Exam  Constitutional: She is oriented to person, place, and time. She appears well-developed and well-nourished.  HENT:  Head: Normocephalic and atraumatic.  Neck: Normal range of motion. Neck supple.   Cardiovascular: Normal rate and regular rhythm.   Pulmonary/Chest: Effort normal and breath sounds normal.  Musculoskeletal:  Normal Muscle Bulk and Muscle Testing Reveals: Upper Extremities: Full ROM and Muscle Strength 5/5 No Flank Tenderness  Back without spinal Tenderness Lower Extremities: Full ROM and Muscle Strength 5/5 Arises from Table slowly using straight cane for support Narrow Based Gait  Neurological: She is alert and oriented to person, place, and time.  Skin: Skin is warm and dry.  Psychiatric: She has a normal mood and affect.  Nursing note and vitals reviewed.         Assessment & Plan:  1. Severe peripheral polyneuropathy of undetermined etiology.: 10/30/2016 Refilled: Morphabond30 mg one tablet three times a day#90 and MSIR 30mg  one tablet at HS. We will continue the opioid monitoring program, this consists of regular clinic visits, examinations, urine drug screen, pill counts as well as use of [place] Controlled Substance Reporting System. 2. Bilateral progressive upper extremity hand pain/ Polyneuropathy. Continue with current medication regime. Tight Glucose Control. Adhere to healthy diet regime.10/30/2016 3. Muscle Spasms: Continue Zanaflex. 10/30/2016 4. Depression/Anxiety: Continue Paxil and Klonopin. 10/30/2016  20 minutes of face to face patient care time was spent during this visit. All questions were encouraged and answered.  F/U in 1 month

## 2016-10-30 NOTE — Telephone Encounter (Signed)
On 10/30/2016 the  [place] was reviewed no conflict was seen on the [place] with multiple prescribers. Ms. Rossano has a signed narcotic contract with our office. If there were any discrepancies this would have been reported to her physician.

## 2016-11-01 ENCOUNTER — Ambulatory Visit: Payer: Medicaid Other | Admitting: Registered Nurse

## 2016-11-09 ENCOUNTER — Telehealth: Payer: Self-pay | Admitting: Physical Medicine & Rehabilitation

## 2016-11-09 NOTE — Telephone Encounter (Signed)
Prior auth submitted to [facility] TRACKS for MSIR 30 mg #30 for #180 days. [name] notified that she must put on her calendar to call us in January to resubmit before the February expiration date to avoid last minute panic like today because she is out of her medication and the expiration on PA was 11/04/16. I also told her to please call us in February of 2019 to renew the [facility] ER.

## 2016-11-09 NOTE — Telephone Encounter (Signed)
Patient is calling to let us know that per her pharmacy she needs a prior authorization on her Morphine ER.  Please get that sent in to her pharmacy [facility] on [address].

## 2016-11-12 NOTE — Telephone Encounter (Signed)
Prior auth approved by [facility] TRACKS  11/09/2016 - 05/08/2017   Confirmation #:[id number]  Prior Approval #:[id number]

## 2016-11-13 ENCOUNTER — Ambulatory Visit: Payer: Medicaid Other | Admitting: Gastroenterology

## 2016-11-13 ENCOUNTER — Other Ambulatory Visit: Payer: Self-pay | Admitting: Registered Nurse

## 2016-11-14 ENCOUNTER — Other Ambulatory Visit: Payer: Self-pay | Admitting: Registered Nurse

## 2016-11-29 ENCOUNTER — Encounter: Payer: Self-pay | Admitting: Registered Nurse

## 2016-11-29 ENCOUNTER — Encounter: Payer: Medicaid Other | Attending: Physical Medicine & Rehabilitation | Admitting: Registered Nurse

## 2016-11-29 VITALS — BP 148/85 | HR 88

## 2016-11-29 DIAGNOSIS — E1140 Type 2 diabetes mellitus with diabetic neuropathy, unspecified: Secondary | ICD-10-CM

## 2016-11-29 DIAGNOSIS — F3289 Other specified depressive episodes: Secondary | ICD-10-CM

## 2016-11-29 DIAGNOSIS — M25562 Pain in left knee: Secondary | ICD-10-CM | POA: Diagnosis present

## 2016-11-29 DIAGNOSIS — Z5181 Encounter for therapeutic drug level monitoring: Secondary | ICD-10-CM

## 2016-11-29 DIAGNOSIS — F411 Generalized anxiety disorder: Secondary | ICD-10-CM

## 2016-11-29 DIAGNOSIS — M25561 Pain in right knee: Secondary | ICD-10-CM | POA: Insufficient documentation

## 2016-11-29 DIAGNOSIS — Z79899 Other long term (current) drug therapy: Secondary | ICD-10-CM | POA: Diagnosis present

## 2016-11-29 DIAGNOSIS — G894 Chronic pain syndrome: Secondary | ICD-10-CM

## 2016-11-29 MED ORDER — MORPHINE SULFATE 30 MG PO TABS: ORAL_TABLET | ORAL | 0 refills | Status: DC

## 2016-11-29 MED ORDER — MORPHINE SULFATE ER 30 MG PO T12A: 30.0000 mg | EXTENDED_RELEASE_TABLET | Freq: Three times a day (TID) | ORAL | 0 refills | Status: DC

## 2016-11-29 NOTE — Progress Notes (Signed)
Subjective:    Patient ID: [name], female    DOB: 1970-11-17, 46 y.o.   MRN: [id number]  HPI: [name] is a 46year old female who returns for follow up appointment for chronic pain and medication refill. She states her pain is located in her bilateral hands, bilateral lower extremities R>Land bilateral feet. She rates her pain 8. Her current exercise regime is walking.  [name] last UDS was on 08/03/2016, it was consistent.  Pain Inventory Average Pain 9 Pain Right Now 8 My pain is intermittent, constant, sharp, burning, dull, stabbing, tingling and aching  In the last 24 hours, has pain interfered with the following? General activity 9 Relation with others 9 Enjoyment of life 9 What TIME of day is your pain at its worst? night Sleep (in general) Poor  Pain is worse with: walking, bending, sitting, inactivity, standing and some activites Pain improves with: rest and medication Relief from Meds: 8  Mobility walk without assistance walk with assistance use a cane how many minutes can you walk? 5-10 ability to climb steps?  no do you drive?  no  Function disabled: date disabled . I need assistance with the following:  meal prep, household duties and shopping  Neuro/Psych weakness numbness tingling trouble walking spasms dizziness depression anxiety  Prior Studies Any changes since last visit?  no  Physicians involved in your care Any changes since last visit?  no   Family History  Problem Relation Age of Onset  . Stroke Maternal Grandmother   . Colon cancer Maternal Grandmother   . Heart Problems Paternal Grandmother   . Dementia Paternal Grandmother   . Heart attack Paternal Grandfather    Social History   Social History  . Marital status: Legally Separated    Spouse name: N/A  . Number of children: N/A  . Years of education: N/A   Occupational History  . Disabled    Social History Main Topics  . Smoking  status: Never Smoker  . Smokeless tobacco: Never Used  . Alcohol use No  . Drug use: No     Comment: rx opiods  . Sexual activity: Not Asked   Other Topics Concern  . None   Social History Narrative  . None   Past Surgical History:  Procedure Laterality Date  . ABDOMINOPLASTY    . CHOLECYSTECTOMY     Past Medical History:  Diagnosis Date  . DEPRESSION/ANXIETY 08/08/2009  . IBS 02/02/2010  . Polyneuropathy 12/04/2010  . Renal stones   . 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) 08/08/2009   BP (!) 148/85   Pulse 88   SpO2 96%   Opioid Risk Score:  6 Fall Risk Score:  `1  Depression screen PHQ 2/9  Depression screen [facility] 2/9 11/29/2016 10/04/2016 09/17/2016 09/17/2016 07/04/2016 08/26/2015 07/18/2015  Decreased Interest 3 3 3 3 2 2  0  Down, Depressed, Hopeless 3 3 2 3 1 1  0  PHQ - 2 Score 6 6 5 6 3 3  0  Altered sleeping - - 3 - - - -  Tired, decreased energy - - 3 - - - -  Change in appetite - - 0 - - - -  Feeling bad or failure about yourself  - - 3 - - - -  Trouble concentrating - - 3 - - - -  Moving slowly or fidgety/restless - - 0 - - - -  Suicidal thoughts - - 1 - - - -  PHQ-9 Score - - 18 - - - -  Difficult doing work/chores - - - - - - -  Some recent data might be hidden    Review of Systems  Constitutional: Positive for diaphoresis.  HENT: Negative.   Eyes: Negative.   Respiratory: Positive for apnea.   Cardiovascular: Negative.   Gastrointestinal: Positive for abdominal pain, constipation, diarrhea and nausea.  Endocrine:       Blood sugars fluctuations  Genitourinary: Negative.   Musculoskeletal: Positive for gait problem.       Spasms  Skin: Negative.   Allergic/Immunologic: Negative.   Neurological: Positive for dizziness, weakness and numbness.       Tingling  Hematological: Negative.   Psychiatric/Behavioral: Positive for dysphoric mood. The patient is nervous/anxious.   All  other systems reviewed and are negative.      Objective:   Physical Exam  Constitutional: She is oriented to person, place, and time. She appears well-developed and well-nourished.  HENT:  Head: Normocephalic and atraumatic.  Neck: Normal range of motion. Neck supple.  Cardiovascular: Normal rate and regular rhythm.   Pulmonary/Chest: Effort normal and breath sounds normal.  Musculoskeletal:  Normal Muscle Bulk and Muscle Testing Reveals: Upper Extremities: Full ROM and Muscle Strength 5/5 Lower Extremities: Full ROM and Muscle Strength 5/5 Bilateral Lower Extremities Flexion Produces Pain into Lower Extremities and Bilateral Feet Arises from Table Slowly using straight cane for support Antalgic Gait  Neurological: She is alert and oriented to person, place, and time.  Skin: Skin is warm and dry.  Psychiatric: She has a normal mood and affect.  Nursing note and vitals reviewed.         Assessment & Plan:  1. Severe peripheral polyneuropathy of undetermined etiology.: 11/29/2016 Refilled: Morphabond30 mg one tablet three times a day#90 and MSIR 30mg  one tablet at HS. We will continue the opioid monitoring program, this consists of regular clinic visits, examinations, urine drug screen, pill counts as well as use of [place] Controlled Substance Reporting System. 2. Bilateral progressive upper extremity hand pain/ Polyneuropathy. Continue with current medication regime. Tight Glucose Control. Adhere to healthy diet regime. We discussed Topamax, [name] would like to wait and speak with Dr, Letta Pate prior to resuming medication, she was on this medication in 2014.Marland Kitchen 11/29/2016 3. Muscle Spasms: Continue Zanaflex. 11/29/2016 4. Depression/Anxiety: Continue Paxil and Klonopin. 11/29/2016  20 minutes of face to face patient care time was spent during this visit. All questions were encouraged and answered.  F/U in 1 month

## 2016-12-22 ENCOUNTER — Other Ambulatory Visit: Payer: Self-pay | Admitting: Physical Medicine & Rehabilitation

## 2016-12-25 ENCOUNTER — Ambulatory Visit (HOSPITAL_BASED_OUTPATIENT_CLINIC_OR_DEPARTMENT_OTHER): Payer: Medicaid Other | Admitting: Physical Medicine & Rehabilitation

## 2016-12-25 ENCOUNTER — Telehealth: Payer: Self-pay | Admitting: *Deleted

## 2016-12-25 ENCOUNTER — Encounter: Payer: Medicaid Other | Attending: Physical Medicine & Rehabilitation

## 2016-12-25 ENCOUNTER — Encounter: Payer: Self-pay | Admitting: Physical Medicine & Rehabilitation

## 2016-12-25 VITALS — BP 138/95 | HR 96

## 2016-12-25 DIAGNOSIS — G894 Chronic pain syndrome: Secondary | ICD-10-CM | POA: Diagnosis present

## 2016-12-25 DIAGNOSIS — Z79899 Other long term (current) drug therapy: Secondary | ICD-10-CM | POA: Diagnosis present

## 2016-12-25 DIAGNOSIS — M25562 Pain in left knee: Secondary | ICD-10-CM | POA: Diagnosis present

## 2016-12-25 DIAGNOSIS — M25561 Pain in right knee: Secondary | ICD-10-CM | POA: Insufficient documentation

## 2016-12-25 DIAGNOSIS — E1140 Type 2 diabetes mellitus with diabetic neuropathy, unspecified: Secondary | ICD-10-CM

## 2016-12-25 DIAGNOSIS — Z5181 Encounter for therapeutic drug level monitoring: Secondary | ICD-10-CM | POA: Diagnosis present

## 2016-12-25 MED ORDER — MORPHINE SULFATE ER 30 MG PO T12A: 30.0000 mg | EXTENDED_RELEASE_TABLET | Freq: Two times a day (BID) | ORAL | 0 refills | Status: DC

## 2016-12-25 MED ORDER — MORPHINE SULFATE 30 MG PO TABS: ORAL_TABLET | ORAL | 0 refills | Status: DC

## 2016-12-25 NOTE — Telephone Encounter (Signed)
Call placed to [facility] to verify that they will have Morphabond in stock. Pharmacist says they do have it.

## 2016-12-25 NOTE — Patient Instructions (Signed)
Please can lace up ankle splints to be worn inside the shoes. This should be available over-the-counter at the [facility] or in the sports section  Please reduce her Klonopin dosage by 1 half tablet at night for 2 weeks and then by 1 tablet at night thereafter

## 2016-12-25 NOTE — Progress Notes (Signed)
Subjective:    Patient ID: [name], female    DOB: 04-Jan-1971, 46 y.o.   MRN: [id number]  HPI  Discussed PMP aware scores Narcotic- top 1% in state  742  Sedative  551  Stimulant  000   OVERDOSE RISK SCORE - 45 fold increase in unintentional overdose death 580  We discussed that the Klonopin has an additive effect on the overdose risk score, and she is taking this now for pain management but overall just for sleep at night. At one point she was taking 2 mg at night and now is down to 1.5 milligrams but we discussed a weaning schedule in which she can reduce by half a tablet every couple weeks  She has been turning her ankle, but fortunately has not sprained her ankle. She has not been dragging her toes. She is now using a cane to ambulate to maintain her balance. She feels like her better since is getting a little bit worse. She is getting some numbness in her hands as well.  Pain Inventory Average Pain 8 Pain Right Now 8 My pain is constant, sharp, burning, dull, stabbing, tingling and aching  In the last 24 hours, has pain interfered with the following? General activity 10 Relation with others 10 Enjoyment of life 10 What TIME of day is your pain at its worst? evening Sleep (in general) Poor  Pain is worse with: . Pain improves with: . Relief from Meds: .  Mobility use a cane ability to climb steps?  no do you drive?  no  Function I need assistance with the following:  meal prep, household duties and shopping  Neuro/Psych No problems in this area  Prior Studies Any changes since last visit?  no  Physicians involved in your care Any changes since last visit?  no   Family History  Problem Relation Age of Onset  . Stroke Maternal Grandmother   . Colon cancer Maternal Grandmother   . Heart Problems Paternal Grandmother   . Dementia Paternal Grandmother   . Heart attack Paternal Grandfather    Social History   Social History  . Marital  status: Legally Separated    Spouse name: N/A  . Number of children: N/A  . Years of education: N/A   Occupational History  . Disabled    Social History Main Topics  . Smoking status: Never Smoker  . Smokeless tobacco: Never Used  . Alcohol use No  . Drug use: No     Comment: rx opiods  . Sexual activity: Not on file   Other Topics Concern  . Not on file   Social History Narrative  . No narrative on file   Past Surgical History:  Procedure Laterality Date  . ABDOMINOPLASTY    . CHOLECYSTECTOMY     Past Medical History:  Diagnosis Date  . DEPRESSION/ANXIETY 08/08/2009  . IBS 02/02/2010  . Polyneuropathy 12/04/2010  . Renal stones   . 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  Opioid Risk Score:   Fall Risk Score:  `1  Depression screen PHQ 2/9  Depression screen [facility] 2/9 11/29/2016 10/04/2016 09/17/2016 09/17/2016 07/04/2016 08/26/2015 07/18/2015  Decreased Interest 3 3 3 3 2 2  0  Down, Depressed, Hopeless 3 3 2 3 1 1  0  PHQ - 2 Score 6 6 5 6 3 3  0  Altered sleeping - - 3 - - - -  Tired, decreased energy - - 3 - - - -  Change in appetite - - 0 - - - -  Feeling bad or failure about yourself  - - 3 - - - -  Trouble concentrating - - 3 - - - -  Moving slowly or fidgety/restless - - 0 - - - -  Suicidal thoughts - - 1 - - - -  PHQ-9 Score - - 18 - - - -  Difficult doing work/chores - - - - - - -  Some recent data might be hidden     Review of Systems  Constitutional: Negative.   HENT: Negative.   Eyes: Negative.   Respiratory: Negative.   Cardiovascular: Negative.   Gastrointestinal: Negative.   Endocrine: Negative.   Genitourinary: Negative.   Musculoskeletal: Negative.   Skin: Negative.   Allergic/Immunologic: Negative.   Neurological: Negative.   Hematological: Negative.   Psychiatric/Behavioral: Negative.   All other systems  reviewed and are negative.      Objective:   Physical Exam  Constitutional: She is oriented to person, place, and time. She appears well-developed and well-nourished.  HENT:  Head: Normocephalic and atraumatic.  Eyes: Pupils are equal, round, and reactive to light. Conjunctivae and EOM are normal.  Neurological: She is alert and oriented to person, place, and time.  Skin: Skin is warm and dry.  Psychiatric: Her affect is blunt.  Nursing note and vitals reviewed.  decreased sensation, left lower extremity to mid calf area. Pinprick Decreased sensation right lower extremity to lower gastroc Decreased sensation pinprick left hand and forearm to an area 10 cm proximal to the wrist crease. Decreased sensation right hand. Pinprick to proximal palm  Motor strength is 4/5, bilateral finger flexors 5/5 in the deltoid, bicep, tricep 5/5 bilateral hip flexor, knee extensor, but 3 minus at the ankle dorsiflexors, inverters and everters bilaterally.  Gait shows no evidence to drag but she does have a mild steppage gait. No foot inversion during ambulation.     Assessment & Plan:  1. Painful diabetic neuropathy. She has tried atypical anticonvulsant such as Lyrica and gabapentin, which have not been helpful. She has tried methadone which was helpful but due to safety factors was converted to morphine. Her current medications have been managing her pain fairly well, we discussed modifications of her current regimen which includes Morpho Bond 30 mg 3 times a day and MSIR 30 mg daily at bedtime Patient will try morphine extended release 30 mg every 12 hours and MSIR 30 mg at bedtime and 30 mg during the day as needed  NP visit in 1 month  2.  Depression, she has had evaluation by neuropsychology at this office, but she prefers to follow up with another provider. No suicidal thoughts. PHQ2=6 , her primary care has a psychologist in the office and she will try to get in with them.

## 2016-12-28 ENCOUNTER — Telehealth: Payer: Self-pay | Admitting: Psychology

## 2016-12-28 NOTE — Telephone Encounter (Signed)
Spoke to patient as requested by Dr. Ola Spurr. Patient was somewhat surprised to hear from [facility] and reported she had told her pain doctor she is not interested in scheduling more appointments because of mobility and pain challenges in getting to appointment. She said she has previously seen Dr. Gwenlyn Saran at [facility] regarding her pain. She is not currently interested in meeting with [facility].  I let her know she can keep [place], [facility], in mind, if at a later date she decided she would like to work with behavioral health on addressing pain.

## 2017-01-09 ENCOUNTER — Other Ambulatory Visit: Payer: Self-pay | Admitting: Registered Nurse

## 2017-01-10 ENCOUNTER — Other Ambulatory Visit: Payer: Self-pay | Admitting: Internal Medicine

## 2017-01-10 NOTE — Telephone Encounter (Signed)
Recieved electronic medication refill request for Clonazepam, according to last note:  Please reduce her Klonopin dosage by 1 half tablet at night for 2 weeks and then by 1 tablet at night thereafter  Was this supposed to be 1 and a half tablet at night for 2 weeks then 1 tablet at night thereafter?  Please advise

## 2017-01-11 ENCOUNTER — Telehealth: Payer: Self-pay | Admitting: *Deleted

## 2017-01-11 ENCOUNTER — Other Ambulatory Visit: Payer: Self-pay | Admitting: Registered Nurse

## 2017-01-11 NOTE — Telephone Encounter (Signed)
[name] called and needs a refill on her klonopin.  She has not reduced her dose as requested by Dr Letta Pate.  I spoke with Dr Letta Pate and she was on 1.5 mg q hs and she is to reduce to 1 mg q hs.  Order called to pharmacy and [facility] notified.

## 2017-01-24 ENCOUNTER — Encounter: Payer: Medicaid Other | Admitting: Registered Nurse

## 2017-01-25 ENCOUNTER — Encounter: Payer: Self-pay | Admitting: Registered Nurse

## 2017-01-25 ENCOUNTER — Encounter: Payer: Medicaid Other | Attending: Physical Medicine & Rehabilitation | Admitting: Registered Nurse

## 2017-01-25 ENCOUNTER — Telehealth: Payer: Self-pay | Admitting: Registered Nurse

## 2017-01-25 VITALS — BP 142/85 | HR 99

## 2017-01-25 DIAGNOSIS — G629 Polyneuropathy, unspecified: Secondary | ICD-10-CM | POA: Diagnosis not present

## 2017-01-25 DIAGNOSIS — Z79899 Other long term (current) drug therapy: Secondary | ICD-10-CM | POA: Diagnosis not present

## 2017-01-25 DIAGNOSIS — M25562 Pain in left knee: Secondary | ICD-10-CM | POA: Insufficient documentation

## 2017-01-25 DIAGNOSIS — M25561 Pain in right knee: Secondary | ICD-10-CM | POA: Diagnosis present

## 2017-01-25 DIAGNOSIS — F3289 Other specified depressive episodes: Secondary | ICD-10-CM

## 2017-01-25 DIAGNOSIS — G894 Chronic pain syndrome: Secondary | ICD-10-CM | POA: Diagnosis present

## 2017-01-25 DIAGNOSIS — E1140 Type 2 diabetes mellitus with diabetic neuropathy, unspecified: Secondary | ICD-10-CM

## 2017-01-25 DIAGNOSIS — F411 Generalized anxiety disorder: Secondary | ICD-10-CM

## 2017-01-25 DIAGNOSIS — Z5181 Encounter for therapeutic drug level monitoring: Secondary | ICD-10-CM | POA: Diagnosis not present

## 2017-01-25 DIAGNOSIS — M62838 Other muscle spasm: Secondary | ICD-10-CM

## 2017-01-25 MED ORDER — MORPHINE SULFATE 30 MG PO TABS: ORAL_TABLET | ORAL | 0 refills | Status: DC

## 2017-01-25 MED ORDER — CLONAZEPAM 1 MG PO TABS: 1.0000 mg | ORAL_TABLET | Freq: Every day | ORAL | 0 refills | Status: DC

## 2017-01-25 MED ORDER — MORPHINE SULFATE ER 30 MG PO T12A: 30.0000 mg | EXTENDED_RELEASE_TABLET | Freq: Two times a day (BID) | ORAL | 0 refills | Status: DC

## 2017-01-25 NOTE — Telephone Encounter (Signed)
On 01/25/2017 the  [place] was reviewed no conflict was seen on the [place] with multiple prescribers. [name] has a signed narcotic contract with our office. If there were any discrepancies this would have been reported to her physician.

## 2017-01-25 NOTE — Progress Notes (Signed)
Subjective:    Patient ID: [name], female    DOB: 05-14-1970, 46 y.o.   MRN: [id number]  HPI: [name] is a 46year old female who returns for follow up appointment for chronic pain and medication refill. She states her pain is located in her bilateral hands, bilateral lower extremities R>Land bilateral feet. She rates her pain 6. Her current exercise regime is walking. [name] trying to adjust to Klonopin 1 mg at [facility], will continue this dose this month and re-evaluate next month. She also reports pain is better controlled with current mediation regime, will continue to monitor.   [name] Morphine equivalent is 120.00 MME.  She is also prescribed Klonopin. We have discussed the black box warning of using opioids and benzodiazepines. I highlighted the dangers of using these drugs together and discussed the adverse events including respiratory suppression, overdose, cognitive impairment and importance of  compliance with current regimen. She verbalizes understanding, we will continue to monitor and adjust as indicated.    [name] last UDS was on 08/03/2016, it was consistent.  Pain Inventory Average Pain 6 Pain Right Now 6 My pain is intermittent, constant, sharp, burning, dull, stabbing, tingling and aching  In the last 24 hours, has pain interfered with the following? General activity 7 Relation with others 7 Enjoyment of life 10 What TIME of day is your pain at its worst? morning and night Sleep (in general) Poor  Pain is worse with: walking, bending, sitting, inactivity and standing Pain improves with: rest and medication Relief from Meds: 6  Mobility walk without assistance walk with assistance use a cane how many minutes can you walk? 5-10 ability to climb steps?  no do you drive?  no  Function disabled: date disabled . I need assistance with the following:  meal prep, household duties and  shopping  Neuro/Psych weakness numbness tremor tingling trouble walking spasms dizziness depression anxiety  Prior Studies Any changes since last visit?  no  Physicians involved in your care Any changes since last visit?  no   Family History  Problem Relation Age of Onset  . Stroke Maternal Grandmother   . Colon cancer Maternal Grandmother   . Heart Problems Paternal Grandmother   . Dementia Paternal Grandmother   . Heart attack Paternal Grandfather    Social History   Social History  . Marital status: Legally Separated    Spouse name: N/A  . Number of children: N/A  . Years of education: N/A   Occupational History  . Disabled    Social History Main Topics  . Smoking status: Never Smoker  . Smokeless tobacco: Never Used  . Alcohol use No  . Drug use: No     Comment: rx opiods  . Sexual activity: Not Asked   Other Topics Concern  . None   Social History Narrative  . None   Past Surgical History:  Procedure Laterality Date  . ABDOMINOPLASTY    . CHOLECYSTECTOMY     Past Medical History:  Diagnosis Date  . DEPRESSION/ANXIETY 08/08/2009  . IBS 02/02/2010  . Polyneuropathy 12/04/2010  . Renal stones   . 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  Opioid Risk Score:  6 Fall Risk Score:  `1  Depression screen PHQ 2/9  Depression screen [facility] 2/9 01/25/2017 11/29/2016 10/04/2016 09/17/2016 09/17/2016 07/04/2016 08/26/2015  Decreased Interest 3 3 3  3 3 2 2   Down, Depressed, Hopeless 3 3 3 2 3 1 1   PHQ - 2 Score 6 6 6 5 6 3 3   Altered sleeping - - - 3 - - -  Tired, decreased energy - - - 3 - - -  Change in appetite - - - 0 - - -  Feeling bad or failure about yourself  - - - 3 - - -  Trouble concentrating - - - 3 - - -  Moving slowly or fidgety/restless - - - 0 - - -  Suicidal thoughts - - - 1 - - -  PHQ-9 Score - - - 18 - - -   Difficult doing work/chores - - - - - - -  Some recent data might be hidden    Review of Systems  Constitutional: Positive for diaphoresis.  HENT: Negative.   Eyes: Negative.   Respiratory: Positive for apnea.   Cardiovascular: Negative.   Gastrointestinal: Positive for abdominal pain, constipation, diarrhea and nausea.  Endocrine:       Blood sugars fluctuations  Genitourinary: Negative.   Musculoskeletal: Positive for gait problem.       Spasms  Skin: Negative.   Allergic/Immunologic: Negative.   Neurological: Positive for dizziness, weakness and numbness.       Tingling  Hematological: Negative.   Psychiatric/Behavioral: Positive for dysphoric mood. The patient is nervous/anxious.   All other systems reviewed and are negative.      Objective:   Physical Exam  Constitutional: She is oriented to person, place, and time. She appears well-developed and well-nourished.  HENT:  Head: Normocephalic and atraumatic.  Neck: Normal range of motion. Neck supple.  Cardiovascular: Normal rate and regular rhythm.   Pulmonary/Chest: Effort normal and breath sounds normal.  Musculoskeletal:  Normal Muscle Bulk and Muscle Testing Reveals: Upper Extremities: Full ROM and Muscle Strength 5/5 Lower Extremities: Full ROM and Muscle Strength 5/5 Bilateral Lower Extremities Flexion Produces Pain into Lower Extremities and Bilateral Feet Arises from Table Slowly using straight cane for support Narrow Based  Gait  Neurological: She is alert and oriented to person, place, and time.  Skin: Skin is warm and dry.  Psychiatric: She has a normal mood and affect.  Nursing note and vitals reviewed.         Assessment & Plan:  1. Severe peripheral polyneuropathy of undetermined etiology.: 01/25/2017 Refilled: Morphabond30 mg one tablet every 12 hours #60 and MSIR 30mg  one tablet at HS, may take a tablet during the day as needed #60. We will continue the opioid monitoring program, this  consists of regular clinic visits, examinations, urine drug screen, pill counts as well as use of [place] Controlled Substance Reporting System. 2. Bilateral progressive upper extremity hand pain/ Polyneuropathy. Continue with current medication regime. Tight Glucose Control. Adhere to healthy diet regime. 01/25/2017 3. Muscle Spasms: Continue Zanaflex. 01/25/2017 4. Depression/Anxiety: Continue Paxil and Klonopin. 11/0/2018  20 minutes of face to face patient care time was spent during this visit. All questions were encouraged and answered.  F/U in 1 month

## 2017-01-30 ENCOUNTER — Other Ambulatory Visit: Payer: Self-pay | Admitting: Internal Medicine

## 2017-01-31 ENCOUNTER — Telehealth: Payer: Self-pay | Admitting: *Deleted

## 2017-01-31 LAB — DRUG TOX MONITOR 1 W/CONF, ORAL FLD
Amphetamines: NEGATIVE ng/mL (ref <10)
Barbiturates: NEGATIVE ng/mL (ref <10)
Benzodiazepines: NEGATIVE ng/mL (ref <0.50)
Buprenorphine: NEGATIVE ng/mL (ref <0.10)
CODEINE: NEGATIVE ng/mL (ref <2.5)
Cocaine: NEGATIVE ng/mL (ref <5.0)
DIHYDROCODEINE: NEGATIVE ng/mL (ref <2.5)
Fentanyl: NEGATIVE ng/mL (ref <0.10)
HYDROCODONE: NEGATIVE ng/mL (ref <2.5)
Heroin Metabolite: NEGATIVE ng/mL (ref <1.0)
Hydromorphone: NEGATIVE ng/mL (ref <2.5)
MARIJUANA: NEGATIVE ng/mL (ref <2.5)
MDMA: NEGATIVE ng/mL (ref <10)
METHADONE: NEGATIVE ng/mL (ref <5.0)
Meprobamate: NEGATIVE ng/mL (ref <2.5)
Morphine: 4 ng/mL — ABNORMAL HIGH (ref <2.5)
NICOTINE METABOLITE: NEGATIVE ng/mL (ref <5.0)
Norhydrocodone: NEGATIVE ng/mL (ref <2.5)
Noroxycodone: NEGATIVE ng/mL (ref <2.5)
OPIATES: POSITIVE ng/mL — AB (ref <2.5)
OXYCODONE: NEGATIVE ng/mL (ref <2.5)
OXYMORPHONE: NEGATIVE ng/mL (ref <2.5)
PHENCYCLIDINE: NEGATIVE ng/mL (ref <10)
TAPENTADOL: NEGATIVE ng/mL (ref <5.0)
TRAMADOL: NEGATIVE ng/mL (ref <5.0)
ZOLPIDEM: NEGATIVE ng/mL (ref <5.0)

## 2017-01-31 LAB — DRUG TOX ALC METAB W/CON, ORAL FLD: Alcohol Metabolite: NEGATIVE ng/mL (ref <25)

## 2017-01-31 NOTE — Telephone Encounter (Signed)
Oral swab drug screen was consistent for prescribed medications.  ?

## 2017-02-25 ENCOUNTER — Other Ambulatory Visit: Payer: Self-pay

## 2017-02-25 ENCOUNTER — Encounter: Payer: Self-pay | Admitting: Registered Nurse

## 2017-02-25 ENCOUNTER — Encounter: Payer: Medicaid Other | Attending: Physical Medicine & Rehabilitation | Admitting: Registered Nurse

## 2017-02-25 VITALS — BP 130/85 | HR 100 | Resp 14

## 2017-02-25 DIAGNOSIS — G629 Polyneuropathy, unspecified: Secondary | ICD-10-CM

## 2017-02-25 DIAGNOSIS — G8929 Other chronic pain: Secondary | ICD-10-CM

## 2017-02-25 DIAGNOSIS — M62838 Other muscle spasm: Secondary | ICD-10-CM

## 2017-02-25 DIAGNOSIS — E1140 Type 2 diabetes mellitus with diabetic neuropathy, unspecified: Secondary | ICD-10-CM | POA: Diagnosis not present

## 2017-02-25 DIAGNOSIS — Z5181 Encounter for therapeutic drug level monitoring: Secondary | ICD-10-CM | POA: Diagnosis present

## 2017-02-25 DIAGNOSIS — Z79899 Other long term (current) drug therapy: Secondary | ICD-10-CM

## 2017-02-25 DIAGNOSIS — M79641 Pain in right hand: Secondary | ICD-10-CM

## 2017-02-25 DIAGNOSIS — M25561 Pain in right knee: Secondary | ICD-10-CM

## 2017-02-25 DIAGNOSIS — F341 Dysthymic disorder: Secondary | ICD-10-CM | POA: Diagnosis not present

## 2017-02-25 DIAGNOSIS — M79642 Pain in left hand: Secondary | ICD-10-CM

## 2017-02-25 DIAGNOSIS — G894 Chronic pain syndrome: Secondary | ICD-10-CM | POA: Diagnosis not present

## 2017-02-25 DIAGNOSIS — M25562 Pain in left knee: Secondary | ICD-10-CM

## 2017-02-25 MED ORDER — MORPHINE SULFATE 30 MG PO TABS: ORAL_TABLET | ORAL | 0 refills | Status: DC

## 2017-02-25 MED ORDER — MORPHINE SULFATE ER 30 MG PO T12A: 30.0000 mg | EXTENDED_RELEASE_TABLET | Freq: Two times a day (BID) | ORAL | 0 refills | Status: DC

## 2017-02-25 NOTE — Progress Notes (Signed)
Subjective:    Patient ID: [name], female    DOB: 08/07/1970, 46 y.o.   MRN: [id number]  HPI: [name] is a 46year old female who returns for follow up appointment for chronic pain and medication refill. She states her pain is located in her bilateral hands, bilateral knees,and bilateral feet, also reports increase intensity of pain in bilateral hands, bilateral knees and bilateral feet with tingling and burning over the last few days.Otherwise her pain has been controlled with new regimen she states. We will order bilateral hand and bilateral knee x-rays, she verbalizes understanding.   She rates her pain 8. Her current exercise regime is walking. [name] states she still having insomnia and  trying to adjust to Klonopin 1 mg at [facility], will continue to monitor.   [name] Morphine equivalent is 120.00 MME.  She is also prescribed Klonopin. We have reviewed the black box warning again in regarding  using opioids and benzodiazepines. I highlighted the dangers of using these drugs together and discussed the adverse events including respiratory suppression, overdose, cognitive impairment and importance of  compliance with current regimen. She verbalizes understanding, we will continue to monitor and adjust as indicated.    [name] last UDS was on 01/25/2017, it was consistent.  Pain Inventory Average Pain 7 Pain Right Now 8 My pain is intermittent, constant, sharp, burning, dull, stabbing, tingling and aching  In the last 24 hours, has pain interfered with the following? General activity 9 Relation with others 9 Enjoyment of life 9 What TIME of day is your pain at its worst? morning and night Sleep (in general) Poor  Pain is worse with: walking, bending, standing, unsure and some activites Pain improves with: rest and medication Relief from Meds: 6  Mobility walk without assistance walk with assistance use a cane how many minutes can you  walk? 10 ability to climb steps?  no do you drive?  no needs help with transfers transfers alone Do you have any goals in this area?  yes  Function disabled: date disabled . I need assistance with the following:  bathing, meal prep, household duties and shopping  Neuro/Psych weakness numbness tremor tingling trouble walking spasms dizziness depression anxiety  Prior Studies Any changes since last visit?  no  Physicians involved in your care Any changes since last visit?  no   Family History  Problem Relation Age of Onset  . Stroke Maternal Grandmother   . Colon cancer Maternal Grandmother   . Heart Problems Paternal Grandmother   . Dementia Paternal Grandmother   . Heart attack Paternal Grandfather    Social History   Socioeconomic History  . Marital status: Legally Separated    Spouse name: None  . Number of children: None  . Years of education: None  . Highest education level: None  Social Needs  . Financial resource strain: None  . Food insecurity - worry: None  . Food insecurity - inability: None  . Transportation needs - medical: None  . Transportation needs - non-medical: None  Occupational History  . Occupation: Disabled  Tobacco Use  . Smoking status: Never Smoker  . Smokeless tobacco: Never Used  Substance and Sexual Activity  . Alcohol use: No    Alcohol/week: 0.0 oz  . Drug use: No    Comment: rx opiods  . Sexual activity: None  Other Topics Concern  . None  Social History Narrative  . None   Past Surgical History:  Procedure  Laterality Date  . ABDOMINOPLASTY    . CHOLECYSTECTOMY     Past Medical History:  Diagnosis Date  . DEPRESSION/ANXIETY 08/08/2009  . IBS 02/02/2010  . Polyneuropathy 12/04/2010  . Renal stones   . 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) 08/08/2009   BP 130/85   Pulse 100   Resp 14   SpO2 98%   Opioid Risk Score:  6 Fall Risk  Score:  `1  Depression screen PHQ 2/9  Depression screen [facility] 2/9 01/25/2017 11/29/2016 10/04/2016 09/17/2016 09/17/2016 07/04/2016 08/26/2015  Decreased Interest 3 3 3 3 3 2 2   Down, Depressed, Hopeless 3 3 3 2 3 1 1   PHQ - 2 Score 6 6 6 5 6 3 3   Altered sleeping - - - 3 - - -  Tired, decreased energy - - - 3 - - -  Change in appetite - - - 0 - - -  Feeling bad or failure about yourself  - - - 3 - - -  Trouble concentrating - - - 3 - - -  Moving slowly or fidgety/restless - - - 0 - - -  Suicidal thoughts - - - 1 - - -  PHQ-9 Score - - - 18 - - -  Difficult doing work/chores - - - - - - -  Some recent data might be hidden    Review of Systems  HENT: Negative.   Eyes: Negative.   Respiratory: Negative.   Cardiovascular: Negative.   Gastrointestinal: Negative.   Endocrine:       Blood sugars fluctuations  Genitourinary: Negative.   Musculoskeletal: Positive for arthralgias and gait problem.       Spasms  Skin: Negative.   Allergic/Immunologic: Negative.   Neurological: Positive for dizziness, weakness and numbness.       Tingling  Hematological: Negative.   Psychiatric/Behavioral: Positive for dysphoric mood. The patient is nervous/anxious.   All other systems reviewed and are negative.      Objective:   Physical Exam  Constitutional: She is oriented to person, place, and time. She appears well-developed and well-nourished.  HENT:  Head: Normocephalic and atraumatic.  Neck: Normal range of motion. Neck supple.  Cardiovascular: Normal rate and regular rhythm.  Pulmonary/Chest: Effort normal and breath sounds normal.  Musculoskeletal:  Normal Muscle Bulk and Muscle Testing Reveals: Upper Extremities: Full ROM and Muscle Strength 5/5 Lower Extremities: Full ROM and Muscle Strength 5/5 Bilateral Lower Extremities Flexion Produces Pain into Lower Extremities and Bilateral Feet Arises from Table Slowly using straight cane for support Narrow Based  Gait  Neurological: She is  alert and oriented to person, place, and time.  Skin: Skin is warm and dry.  Psychiatric: She has a normal mood and affect.  Nursing note and vitals reviewed.         Assessment & Plan:  1. Severe peripheral polyneuropathy of undetermined etiology.: 02/25/2017 Refilled: Morphabond30 mg one tablet every 12 hours #60 and MSIR 30mg  one tablet at HS, may take a tablet during the day as needed #60. We will continue the opioid monitoring program, this consists of regular clinic visits, examinations, urine drug screen, pill counts as well as use of [place] Controlled Substance Reporting System. 2. Bilateral progressive upper extremity hand pain/ Polyneuropathy.Continue with current medication regime. Tight Glucose Control. Adhere to healthy diet regime. 02/25/2017 3. Muscle Spasms: Continue Zanaflex. 02/25/2017 4. Depression/Anxiety: Continue Paxil and Klonopin. 12/0/3018  20 minutes of face to face  patient care time was spent during this visit. All questions were encouraged and answered.  F/U in 1 month

## 2017-03-01 ENCOUNTER — Other Ambulatory Visit: Payer: Self-pay | Admitting: Gastroenterology

## 2017-03-04 ENCOUNTER — Other Ambulatory Visit: Payer: Self-pay | Admitting: Registered Nurse

## 2017-03-08 ENCOUNTER — Encounter: Payer: Medicaid Other | Admitting: Internal Medicine

## 2017-03-14 ENCOUNTER — Encounter: Payer: Self-pay | Admitting: Physical Medicine & Rehabilitation

## 2017-03-14 ENCOUNTER — Encounter: Payer: Self-pay | Admitting: Internal Medicine

## 2017-03-15 ENCOUNTER — Other Ambulatory Visit: Payer: Self-pay | Admitting: *Deleted

## 2017-03-22 ENCOUNTER — Ambulatory Visit (INDEPENDENT_AMBULATORY_CARE_PROVIDER_SITE_OTHER): Payer: Medicaid Other | Admitting: Internal Medicine

## 2017-03-22 ENCOUNTER — Other Ambulatory Visit: Payer: Self-pay

## 2017-03-22 ENCOUNTER — Encounter: Payer: Self-pay | Admitting: Internal Medicine

## 2017-03-22 VITALS — BP 150/88 | HR 95 | Temp 98.0°F | Ht 72.0 in | Wt 286.8 lb

## 2017-03-22 DIAGNOSIS — E781 Pure hyperglyceridemia: Secondary | ICD-10-CM

## 2017-03-22 DIAGNOSIS — G629 Polyneuropathy, unspecified: Secondary | ICD-10-CM | POA: Diagnosis not present

## 2017-03-22 DIAGNOSIS — I10 Essential (primary) hypertension: Secondary | ICD-10-CM | POA: Diagnosis not present

## 2017-03-22 DIAGNOSIS — R5383 Other fatigue: Secondary | ICD-10-CM

## 2017-03-22 DIAGNOSIS — Z5181 Encounter for therapeutic drug level monitoring: Secondary | ICD-10-CM | POA: Diagnosis not present

## 2017-03-22 DIAGNOSIS — H9313 Tinnitus, bilateral: Secondary | ICD-10-CM | POA: Diagnosis not present

## 2017-03-22 DIAGNOSIS — M799 Soft tissue disorder, unspecified: Secondary | ICD-10-CM

## 2017-03-22 DIAGNOSIS — E1142 Type 2 diabetes mellitus with diabetic polyneuropathy: Secondary | ICD-10-CM

## 2017-03-22 DIAGNOSIS — Z23 Encounter for immunization: Secondary | ICD-10-CM

## 2017-03-22 DIAGNOSIS — L84 Corns and callosities: Secondary | ICD-10-CM

## 2017-03-22 DIAGNOSIS — Z789 Other specified health status: Secondary | ICD-10-CM

## 2017-03-22 DIAGNOSIS — M7989 Other specified soft tissue disorders: Secondary | ICD-10-CM

## 2017-03-22 DIAGNOSIS — D1723 Benign lipomatous neoplasm of skin and subcutaneous tissue of right leg: Secondary | ICD-10-CM | POA: Diagnosis not present

## 2017-03-22 LAB — POCT GLYCOSYLATED HEMOGLOBIN (HGB A1C): HEMOGLOBIN A1C: 8.7

## 2017-03-22 NOTE — Patient Instructions (Addendum)
Ms. 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  I will refer you to podiatry.  You should get a phone call about making appointments within the next 2 weeks. Please call our office if you have not heard about these referrals.  I will message you about your lab results.  Try nasal saline twice a day to see if this improves ringing in ears. If not, call and I can refer to ENT.  Please see me back in 6 months or sooner as needed (I will message you if it should be sooner based on A1c).   Best, Dr. Ola Spurr

## 2017-03-23 ENCOUNTER — Encounter: Payer: Self-pay | Admitting: Internal Medicine

## 2017-03-23 LAB — COMPREHENSIVE METABOLIC PANEL
A/G RATIO: 1.4 (ref 1.2–2.2)
ALT: 28 IU/L (ref 0–32)
AST: 21 IU/L (ref 0–40)
Albumin: 4.1 g/dL (ref 3.5–5.5)
Alkaline Phosphatase: 55 IU/L (ref 39–117)
BILIRUBIN TOTAL: 0.2 mg/dL (ref 0.0–1.2)
BUN / CREAT RATIO: 19 (ref 9–23)
BUN: 8 mg/dL (ref 6–24)
CHLORIDE: 98 mmol/L (ref 96–106)
CO2: 25 mmol/L (ref 20–29)
Calcium: 9.6 mg/dL (ref 8.7–10.2)
Creatinine, Ser: 0.42 mg/dL — ABNORMAL LOW (ref 0.57–1.00)
GFR calc non Af Amer: 122 mL/min/{1.73_m2} (ref >59)
GFR, EST AFRICAN AMERICAN: 140 mL/min/{1.73_m2} (ref >59)
Globulin, Total: 2.9 g/dL (ref 1.5–4.5)
Glucose: 128 mg/dL — ABNORMAL HIGH (ref 65–99)
POTASSIUM: 4.9 mmol/L (ref 3.5–5.2)
Sodium: 142 mmol/L (ref 134–144)
TOTAL PROTEIN: 7 g/dL (ref 6.0–8.5)

## 2017-03-23 LAB — TSH: TSH: 1.68 u[IU]/mL (ref 0.450–4.500)

## 2017-03-23 LAB — CBC WITH DIFFERENTIAL
BASOS: 1 %
Basophils Absolute: 0.1 10*3/uL (ref 0.0–0.2)
EOS (ABSOLUTE): 0.3 10*3/uL (ref 0.0–0.4)
EOS: 4 %
HEMOGLOBIN: 13.6 g/dL (ref 11.1–15.9)
Hematocrit: 41.2 % (ref 34.0–46.6)
IMMATURE GRANS (ABS): 0 10*3/uL (ref 0.0–0.1)
IMMATURE GRANULOCYTES: 0 %
LYMPHS: 48 %
Lymphocytes Absolute: 3.8 10*3/uL — ABNORMAL HIGH (ref 0.7–3.1)
MCH: 28 pg (ref 26.6–33.0)
MCHC: 33 g/dL (ref 31.5–35.7)
MCV: 85 fL (ref 79–97)
MONOCYTES: 6 %
Monocytes Absolute: 0.4 10*3/uL (ref 0.1–0.9)
Neutrophils Absolute: 3.2 10*3/uL (ref 1.4–7.0)
Neutrophils: 41 %
RBC: 4.85 x10E6/uL (ref 3.77–5.28)
RDW: 14.1 % (ref 12.3–15.4)
WBC: 7.8 10*3/uL (ref 3.4–10.8)

## 2017-03-23 LAB — VITAMIN B12: Vitamin B-12: 526 pg/mL (ref 232–1245)

## 2017-03-23 LAB — FERRITIN: FERRITIN: 29 ng/mL (ref 15–150)

## 2017-03-23 LAB — IRON: IRON: 52 ug/dL (ref 27–159)

## 2017-03-23 LAB — FOLATE: Folate: >20 ng/mL (ref >3.0)

## 2017-03-23 LAB — VITAMIN D 25 HYDROXY (VIT D DEFICIENCY, FRACTURES): VIT D 25 HYDROXY: 20.4 ng/mL — AB (ref 30.0–100.0)

## 2017-03-23 NOTE — Progress Notes (Signed)
[facility] Progress Note  Subjective:  ROSELLE NORTON is a 46 y.o. female with history of HTN, polyneuropathy, chronic pain, T2DM, morbid obesity, here for follow-up HLD, T2DM, and concerns about vegetarian diet, right thigh mass, tinnitus.  #T2DM: - Takes metformin 1000 mg and glipizide 10 mg BID - Not exercising  - Not eating regular meals - Knows she is due to see her eye doctor - on enalapril 10 mg daily - has concerns about calluses on her feet and would like to see Podiatry - Follows with pain clinic for painful peripheral neuropathy  #HLD: - Did not increase atorvastatin to 80 mg as suggested over the summer - worried about triglycerides, as they have been very high in the past  #Mass on R thigh: - Present for years. Thinks is getting better. Would consider removal. - Does not hurt.  - Is more prominent depending on leg position  #HTN: - decreased enalapril to 10 mg daily from 20 mg daily at last visit in June, as patient was normotensive and said she had been out of medication for months - also complains of occasional dizziness  #Vegetarian diet: - Does eat beans and green leafy vegetables - Worried about vitamin levels; does take a multivitamin. Would like ferritin, iron, folate, B12, and zinc tested.   #Tinnitis: - Reports ongoing whirring she hears in both ears when quiet and crunching sound in neck with movement; present for years - Reports good hearing and did not make appointment with audiology-- says she was never contacted after referral placed - No recent episodes of vertigo  ROS: Ringing in ears, occasional fast heartbeat, numbness of feet and LEs, stress and anzxiety  Social: Does not drink, smoke, or exercise or do illegal drugs  Allergies  Allergen Reactions  . Codeine     confusion  . Vancomycin     Red Man's syndrome  . Nucynta [Tapentadol] Other (See Comments)    headaches    Social History   Tobacco Use  . Smoking  status: Never Smoker  . Smokeless tobacco: Never Used  Substance Use Topics  . Alcohol use: No    Alcohol/week: 0.0 oz    Objective: Blood pressure (!) 164/90, pulse 95, temperature 98 F (36.7 C), temperature source Oral, height 6' (1.829 m), weight 286 lb 12.8 oz (130.1 kg), SpO2 98 %. Body mass index is 38.9 kg/m. Repeat BP was 150/88 Constitutional: Obese female, in NAD HENT: MMM, mild nasal congestion and minimal fluid behind bilateral TMs Cardiovascular: RRR, S1, S2, no m/r/g.  Pulmonary/Chest: Effort normal and breath sounds normal.  Musculoskeletal: ~6 cm x 3 cm soft mass of R inner thigh Neurological: AOx3, cranial nerves intact. No sensation with monofilament testing of feet but proprioception intact.  Skin: Calluses of big toes bilaterally.  Psychiatric: Anxious affect.  Vitals reviewed  Assessment/Plan: DM2 (diabetes mellitus, type 2) - A1c 8.7 compared to 7.4 six months ago. Does not think she's had much change in her diet. Reports taking medications consistently. - Results available after patient left. Will suggest starting trulicity.  - Patient to schedule eye appointment.  - Follows with pain management for peripheral neuropathy - Will refer to podiatry for callus management and consideration of diabetic shoes  Essential hypertension, benign - Not at goal today. - Recommended patient do ambulatory blood pressure monitoring, as she says she can get high readings at doctor's office.  - On enalapril 10 mg  Hypertriglyceridemia - On lipitor 40 mg daily. Had suggested  increase to 80 mg several months ago, but patient did not get message.  - Intended to check lipid panel but order did not go through. Will place as future lab order to patient to have completed prior to appointment for ambulatory blood pressure monitoring.   Soft tissue masses - Present on R inner thigh and L shin. 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 Patient interested in removal. - Will refer to general  surgery  Tinnitus of both ears - Patient with fluid behind TMs today. Recommended trial of nasal steroid. Patient would prefer to use nasal saline regularly. Suggested she try this for a month or so and to let me know if she has improvement. No murmur on exam to suggest cardiac etiology. No history of carotid bruits.   Vegetarian diet - Placed orders for iron, ferritin, B12 and folate as requested. Did not see role for obtaining zinc levels; discussed with patient.   Obesity, Class II, BMI 35-39.9, with comorbidity - Discussed need for some regular exercise. Patient had been considering swimming but is afraid of stairs in pool given her significant neuropathy. Suggested recumbent bike. Patient says her friend has one she could be given. Discussed starting slowing and building up to longer periods of time on machine.  - Patient requests TSH, vitamin D levels. Have not been obtained recently.   Follow-up in 1-2 weeks for ambulatory blood pressure monitoring.  Olene Floss, MD [place], PGY-3

## 2017-03-25 ENCOUNTER — Encounter: Payer: Self-pay | Admitting: Internal Medicine

## 2017-03-25 DIAGNOSIS — Z789 Other specified health status: Secondary | ICD-10-CM | POA: Insufficient documentation

## 2017-03-25 NOTE — Assessment & Plan Note (Signed)
-   Placed orders for iron, ferritin, B12 and folate as requested. Did not see role for obtaining zinc levels; discussed with patient.

## 2017-03-25 NOTE — Assessment & Plan Note (Signed)
-   Present on R inner thigh and L shin. 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 Patient interested in removal. - Will refer to general surgery

## 2017-03-25 NOTE — Assessment & Plan Note (Addendum)
-   Not at goal today. - Recommended patient do ambulatory blood pressure monitoring, as she says she can get high readings at doctor's office.  - On enalapril 10 mg

## 2017-03-25 NOTE — Assessment & Plan Note (Signed)
-   Discussed need for some regular exercise. Patient had been considering swimming but is afraid of stairs in pool given her significant neuropathy. Suggested recumbent bike. Patient says her friend has one she could be given. Discussed starting slowing and building up to longer periods of time on machine.  - Patient requests TSH, vitamin D levels. Have not been obtained recently.

## 2017-03-25 NOTE — Assessment & Plan Note (Signed)
-   Patient with fluid behind TMs today. Recommended trial of nasal steroid. Patient would prefer to use nasal saline regularly. Suggested she try this for a month or so and to let me know if she has improvement. No murmur on exam to suggest cardiac etiology. No history of carotid bruits.

## 2017-03-25 NOTE — Assessment & Plan Note (Signed)
-   On lipitor 40 mg daily. Had suggested increase to 80 mg several months ago, but patient did not get message.  - Intended to check lipid panel but order did not go through. Will place as future lab order to patient to have completed prior to appointment for ambulatory blood pressure monitoring.

## 2017-03-25 NOTE — Assessment & Plan Note (Addendum)
-   A1c 8.7 compared to 7.4 six months ago. Does not think she's had much change in her diet. Reports taking medications consistently. - Results available after patient left. Will suggest starting trulicity.  - Patient to schedule eye appointment.  - Follows with pain management for peripheral neuropathy - Will refer to podiatry for callus management and consideration of diabetic shoes

## 2017-03-27 ENCOUNTER — Encounter: Payer: Self-pay | Admitting: Registered Nurse

## 2017-03-27 ENCOUNTER — Other Ambulatory Visit: Payer: Self-pay | Admitting: Physical Medicine & Rehabilitation

## 2017-03-27 ENCOUNTER — Encounter: Payer: Medicaid Other | Attending: Physical Medicine & Rehabilitation | Admitting: Registered Nurse

## 2017-03-27 VITALS — BP 126/75 | HR 91

## 2017-03-27 DIAGNOSIS — G894 Chronic pain syndrome: Secondary | ICD-10-CM

## 2017-03-27 DIAGNOSIS — E1140 Type 2 diabetes mellitus with diabetic neuropathy, unspecified: Secondary | ICD-10-CM | POA: Diagnosis not present

## 2017-03-27 DIAGNOSIS — Z5181 Encounter for therapeutic drug level monitoring: Secondary | ICD-10-CM | POA: Diagnosis present

## 2017-03-27 DIAGNOSIS — F341 Dysthymic disorder: Secondary | ICD-10-CM

## 2017-03-27 DIAGNOSIS — M25561 Pain in right knee: Secondary | ICD-10-CM | POA: Insufficient documentation

## 2017-03-27 DIAGNOSIS — M25562 Pain in left knee: Secondary | ICD-10-CM | POA: Insufficient documentation

## 2017-03-27 DIAGNOSIS — G629 Polyneuropathy, unspecified: Secondary | ICD-10-CM

## 2017-03-27 DIAGNOSIS — Z79899 Other long term (current) drug therapy: Secondary | ICD-10-CM | POA: Diagnosis present

## 2017-03-27 MED ORDER — MORPHINE SULFATE 30 MG PO TABS: ORAL_TABLET | ORAL | 0 refills | Status: DC

## 2017-03-27 MED ORDER — CLONAZEPAM 0.5 MG PO TABS: ORAL_TABLET | ORAL | 1 refills | Status: DC

## 2017-03-27 MED ORDER — MORPHINE SULFATE ER 30 MG PO T12A: 30.0000 mg | EXTENDED_RELEASE_TABLET | Freq: Two times a day (BID) | ORAL | 0 refills | Status: DC

## 2017-03-27 NOTE — Progress Notes (Signed)
Subjective:    Patient ID: [name], female    DOB: [birth year], 47 y.o.   MRN: [id number]  HPI: [name] is a 47year old female who returns for follow up appointment for chronic pain and medication refill. She states her pain is located in her bilateral hands, bilateral lower extremities and bilateral feet, She rates her pain 8. Her current exercise regime is walking.  [name] Morphine equivalent is 120.00 MME.  She is also prescribed Klonopin. We have reviewed the black box warning again in regarding  using opioids and benzodiazepines. I highlighted the dangers of using these drugs together and discussed the adverse events including respiratory suppression, overdose, cognitive impairment and importance of  compliance with current regimen. She verbalizes understanding, we will continue to monitor and adjust as indicated.    [name] last UDS was on 01/25/2017, it was consistent.  Pain Inventory Average Pain 6 Pain Right Now 8 My pain is intermittent, constant, sharp, burning, dull, stabbing, tingling and aching  In the last 24 hours, has pain interfered with the following? General activity 9 Relation with others 9 Enjoyment of life 9 What TIME of day is your pain at its worst? morning and night Sleep (in general) Poor  Pain is worse with: walking, bending, standing, unsure and some activites Pain improves with: rest and medication Relief from Meds: 6  Mobility walk without assistance walk with assistance use a cane how many minutes can you walk? 10 ability to climb steps?  no do you drive?  no needs help with transfers transfers alone Do you have any goals in this area?  yes  Function disabled: date disabled . I need assistance with the following:  bathing, meal prep, household duties and shopping  Neuro/Psych weakness numbness tremor tingling trouble walking spasms dizziness depression anxiety  Prior Studies Any changes  since last visit?  no  Physicians involved in your care Any changes since last visit?  no   Family History  Problem Relation Age of Onset  . Stroke Maternal Grandmother   . Colon cancer Maternal Grandmother   . Heart Problems Paternal Grandmother   . Dementia Paternal Grandmother   . Heart attack Paternal Grandfather    Social History   Socioeconomic History  . Marital status: Legally Separated    Spouse name: Not on file  . Number of children: Not on file  . Years of education: Not on file  . Highest education level: Not on file  Social Needs  . Financial resource strain: Not on file  . Food insecurity - worry: Not on file  . Food insecurity - inability: Not on file  . Transportation needs - medical: Not on file  . Transportation needs - non-medical: Not on file  Occupational History  . Occupation: Disabled  Tobacco Use  . Smoking status: Never Smoker  . Smokeless tobacco: Never Used  Substance and Sexual Activity  . Alcohol use: No    Alcohol/week: 0.0 oz  . Drug use: No    Comment: rx opiods  . Sexual activity: Not on file  Other Topics Concern  . Not on file  Social History Narrative  . Not on file   Past Surgical History:  Procedure Laterality Date  . ABDOMINOPLASTY    . CHOLECYSTECTOMY     Past Medical History:  Diagnosis Date  . DEPRESSION/ANXIETY 08/08/2009  . IBS 02/02/2010  . Polyneuropathy 12/04/2010  . Renal stones   . 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  Opioid Risk Score:  6 Fall Risk Score:  `1  Depression screen PHQ 2/9  Depression screen [facility] 2/9 03/22/2017 01/25/2017 11/29/2016 10/04/2016 09/17/2016 09/17/2016 07/04/2016  Decreased Interest 3 3 3 3 3 3 2   Down, Depressed, Hopeless 3 3 3 3 2 3 1   PHQ - 2 Score 6 6 6 6 5 6 3   Altered sleeping - - - - 3 - -  Tired, decreased energy - - - - 3 - -  Change in  appetite - - - - 0 - -  Feeling bad or failure about yourself  - - - - 3 - -  Trouble concentrating - - - - 3 - -  Moving slowly or fidgety/restless - - - - 0 - -  Suicidal thoughts - - - - 1 - -  PHQ-9 Score - - - - 18 - -  Difficult doing work/chores - - - - - - -  Some recent data might be hidden    Review of Systems  Constitutional: Positive for chills, diaphoresis and fever.  HENT: Negative.   Eyes: Negative.   Respiratory: Negative.   Cardiovascular: Negative.   Gastrointestinal: Positive for abdominal pain, constipation, diarrhea and nausea.  Endocrine:       Blood sugars fluctuations  Genitourinary: Negative.   Musculoskeletal: Positive for arthralgias and gait problem.       Spasms  Skin: Negative.   Allergic/Immunologic: Negative.   Neurological: Positive for dizziness, weakness and numbness.       Tingling  Hematological: Negative.   Psychiatric/Behavioral: Positive for dysphoric mood. The patient is nervous/anxious.   All other systems reviewed and are negative.      Objective:   Physical Exam  Constitutional: She is oriented to person, place, and time. She appears well-developed and well-nourished.  HENT:  Head: Normocephalic and atraumatic.  Neck: Normal range of motion. Neck supple.  Cardiovascular: Normal rate and regular rhythm.  Pulmonary/Chest: Effort normal and breath sounds normal.  Musculoskeletal:  Normal Muscle Bulk and Muscle Testing Reveals: Upper Extremities: Full ROM and Muscle Strength 5/5 Lower Extremities: Full ROM and Muscle Strength 5/5 Bilateral Lower Extremities Flexion Produces Pain into Lower Extremities and Bilateral Feet Arises from Table Slowly using straight cane for support Narrow Based  Gait  Neurological: She is alert and oriented to person, place, and time.  Skin: Skin is warm and dry.  Psychiatric: She has a normal mood and affect.  Nursing note and vitals reviewed.         Assessment & Plan:  1. Severe peripheral  polyneuropathy of undetermined etiology.: 03/27/2017 Refilled: Morphabond30 mg one tablet every 12 hours #60 and MSIR 30mg  one tablet at HS, may take a tablet during the day as needed #60. We will continue the opioid monitoring program, this consists of regular clinic visits, examinations, urine drug screen, pill counts as well as use of [place] Controlled Substance Reporting System. 2. Bilateral progressive upper extremity hand pain/ Polyneuropathy.Continue with current medication regime. Tight Glucose Control. Adhere to healthy diet regime. 03/27/2017 3. Muscle Spasms: Continue Zanaflex. 03/27/2017 4. Depression/Anxiety: Continue Paxil and Klonopin. 03/27/2017  20 minutes of face to face patient care time was spent during this visit. All questions were encouraged and answered.  F/U in 1 month

## 2017-04-04 ENCOUNTER — Other Ambulatory Visit: Payer: Self-pay | Admitting: Internal Medicine

## 2017-04-22 ENCOUNTER — Encounter: Payer: Self-pay | Admitting: Internal Medicine

## 2017-04-24 ENCOUNTER — Encounter: Payer: Medicaid Other | Admitting: Registered Nurse

## 2017-04-24 ENCOUNTER — Encounter: Payer: Self-pay | Admitting: Registered Nurse

## 2017-04-24 ENCOUNTER — Other Ambulatory Visit: Payer: Self-pay

## 2017-04-24 VITALS — BP 116/72 | HR 96

## 2017-04-24 DIAGNOSIS — Z79899 Other long term (current) drug therapy: Secondary | ICD-10-CM | POA: Diagnosis not present

## 2017-04-24 DIAGNOSIS — G894 Chronic pain syndrome: Secondary | ICD-10-CM

## 2017-04-24 DIAGNOSIS — F341 Dysthymic disorder: Secondary | ICD-10-CM | POA: Diagnosis not present

## 2017-04-24 DIAGNOSIS — G629 Polyneuropathy, unspecified: Secondary | ICD-10-CM

## 2017-04-24 DIAGNOSIS — Z5181 Encounter for therapeutic drug level monitoring: Secondary | ICD-10-CM

## 2017-04-24 DIAGNOSIS — E1140 Type 2 diabetes mellitus with diabetic neuropathy, unspecified: Secondary | ICD-10-CM

## 2017-04-24 DIAGNOSIS — M62838 Other muscle spasm: Secondary | ICD-10-CM | POA: Diagnosis not present

## 2017-04-24 MED ORDER — MORPHINE SULFATE ER 30 MG PO T12A: 30.0000 mg | EXTENDED_RELEASE_TABLET | Freq: Two times a day (BID) | ORAL | 0 refills | Status: DC

## 2017-04-24 MED ORDER — MORPHINE SULFATE 30 MG PO TABS: ORAL_TABLET | ORAL | 0 refills | Status: DC

## 2017-04-24 NOTE — Progress Notes (Signed)
Subjective:    Patient ID: [name], female    DOB: 07/08/1970, 47 y.o.   MRN: [id number]  HPI: [name] is a 47year old female who returns for follow up appointment for chronic pain and medication refill. She states her pain is located in her bilateral hands, bilateral lower extremities and bilateral feet. She rates her pain 8. Her current exercise regime is walking.  [name] equivalent is 120.00 MME.  She is also prescribed Klonopin. We have reviewed the black box warning again regarding  using opioids and benzodiazepines. I highlighted the dangers of using these drugs together and discussed the adverse events including respiratory suppression, overdose, cognitive impairment and importance of  compliance with current regimen. She verbalizes understanding, we will continue to monitor and adjust as indicated.    Ms. Fleishman last UDS was on 01/25/2017, it was consistent.  Pain Inventory Average Pain 8 Pain Right Now 8 My pain is intermittent, constant, sharp, burning, dull, stabbing, tingling and aching  In the last 24 hours, has pain interfered with the following? General activity 9 Relation with others 10 Enjoyment of life 9 What TIME of day is your pain at its worst? morning and night Sleep (in general) Poor  Pain is worse with: walking, bending, standing, unsure and some activites Pain improves with: rest and medication Relief from Meds: 6  Mobility walk without assistance walk with assistance use a cane how many minutes can you walk? 5 ability to climb steps?  no do you drive?  no needs help with transfers transfers alone Do you have any goals in this area?  yes  Function disabled: date disabled . I need assistance with the following:  bathing, meal prep, household duties and shopping  Neuro/Psych weakness numbness tremor tingling trouble walking spasms dizziness depression anxiety  Prior Studies Any changes  since last visit?  no  Physicians involved in your care Any changes since last visit?  no   Family History  Problem Relation Age of Onset  . Stroke Maternal Grandmother   . Colon cancer Maternal Grandmother   . Heart Problems Paternal Grandmother   . Dementia Paternal Grandmother   . Heart attack Paternal Grandfather    Social History   Socioeconomic History  . Marital status: Legally Separated    Spouse name: None  . Number of children: None  . Years of education: None  . Highest education level: None  Social Needs  . Financial resource strain: None  . Food insecurity - worry: None  . Food insecurity - inability: None  . Transportation needs - medical: None  . Transportation needs - non-medical: None  Occupational History  . Occupation: Disabled  Tobacco Use  . Smoking status: Never Smoker  . Smokeless tobacco: Never Used  Substance and Sexual Activity  . Alcohol use: No    Alcohol/week: 0.0 oz  . Drug use: No    Comment: rx opiods  . Sexual activity: None  Other Topics Concern  . None  Social History Narrative  . None   Past Surgical History:  Procedure Laterality Date  . ABDOMINOPLASTY    . CHOLECYSTECTOMY     Past Medical History:  Diagnosis Date  . DEPRESSION/ANXIETY 08/08/2009  . IBS 02/02/2010  . Polyneuropathy 12/04/2010  . Renal stones   . 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) 08/08/2009   BP 116/72   Pulse 96   SpO2 95%   Opioid  Risk Score:  6 Fall Risk Score:  `1  Depression screen PHQ 2/9  Depression screen [facility] 2/9 04/24/2017 03/22/2017 01/25/2017 11/29/2016 10/04/2016 09/17/2016 09/17/2016  Decreased Interest 3 3 3 3 3 3 3   Down, Depressed, Hopeless 3 3 3 3 3 2 3   PHQ - 2 Score 6 6 6 6 6 5 6   Altered sleeping - - - - - 3 -  Tired, decreased energy - - - - - 3 -  Change in appetite - - - - - 0 -  Feeling bad or failure about yourself  - - - - - 3 -  Trouble  concentrating - - - - - 3 -  Moving slowly or fidgety/restless - - - - - 0 -  Suicidal thoughts - - - - - 1 -  PHQ-9 Score - - - - - 18 -  Difficult doing work/chores - - - - - - -  Some recent data might be hidden    Review of Systems  HENT: Negative.   Eyes: Negative.   Respiratory: Negative.   Cardiovascular: Negative.   Gastrointestinal: Negative.   Endocrine:       Blood sugars fluctuations  Genitourinary: Negative.   Musculoskeletal: Positive for arthralgias and gait problem.       Spasms  Skin: Negative.   Allergic/Immunologic: Negative.   Neurological: Positive for dizziness, tremors, weakness and numbness.       Tingling  Hematological: Negative.   Psychiatric/Behavioral: Positive for dysphoric mood. The patient is nervous/anxious.   All other systems reviewed and are negative.      Objective:   Physical Exam  Constitutional: She is oriented to person, place, and time. She appears well-developed and well-nourished.  HENT:  Head: Normocephalic and atraumatic.  Neck: Normal range of motion. Neck supple.  Cardiovascular: Normal rate.  Pulmonary/Chest: Effort normal and breath sounds normal.  Musculoskeletal:  Normal Muscle Bulk and Muscle Testing Reveals: Upper Extremities: Full ROM and Muscle Strength 5/5   Lower Extremities: Full ROM and Muscle Strength 5/5 Arises from Table Slowly using straight cane for support Narrow Based  Gait  Neurological: She is alert and oriented to person, place, and time.  Skin: Skin is warm and dry.  Psychiatric: She has a normal mood and affect.  Nursing note and vitals reviewed.         Assessment & Plan:  1. Severe peripheral polyneuropathy of undetermined etiology.: 04/24/2017 Refilled: Morphabond30 mg one tablet every 12 hours #60 and MSIR 30mg  one tablet at HS, may take a tablet during the day as needed #60. We will continue the opioid monitoring program, this consists of regular clinic visits, examinations, urine  drug screen, pill counts as well as use of [place] Controlled Substance Reporting System. 2. Bilateral progressive upper extremity hand pain/ Polyneuropathy.Continue with current medication regime. Tight Glucose Control. Adhere to healthy diet regime. 04/24/2017 3. Muscle Spasms: Continue Zanaflex. 04/24/2017 4. Depression/Anxiety: Continue Paxil and Klonopin. 04/24/2017  20 minutes of face to face patient care time was spent during this visit. All questions were encouraged and answered.  F/U in 1 month

## 2017-04-29 LAB — DRUG TOX MONITOR 1 W/CONF, ORAL FLD
Amphetamines: NEGATIVE ng/mL (ref <10)
Barbiturates: NEGATIVE ng/mL (ref <10)
Benzodiazepines: NEGATIVE ng/mL (ref <0.50)
Buprenorphine: NEGATIVE ng/mL (ref <0.10)
Cocaine: NEGATIVE ng/mL (ref <5.0)
Codeine: NEGATIVE ng/mL (ref <2.5)
DIHYDROCODEINE: NEGATIVE ng/mL (ref <2.5)
Fentanyl: NEGATIVE ng/mL (ref <0.10)
HEROIN METABOLITE: NEGATIVE ng/mL (ref <1.0)
Hydrocodone: NEGATIVE ng/mL (ref <2.5)
Hydromorphone: NEGATIVE ng/mL (ref <2.5)
MARIJUANA: NEGATIVE ng/mL (ref <2.5)
MDMA: NEGATIVE ng/mL (ref <10)
MEPROBAMATE: NEGATIVE ng/mL (ref <2.5)
METHADONE: NEGATIVE ng/mL (ref <5.0)
MORPHINE: 10.1 ng/mL — AB (ref <2.5)
NOROXYCODONE: NEGATIVE ng/mL (ref <2.5)
Nicotine Metabolite: NEGATIVE ng/mL (ref <5.0)
Norhydrocodone: NEGATIVE ng/mL (ref <2.5)
OXYCODONE: NEGATIVE ng/mL (ref <2.5)
Opiates: POSITIVE ng/mL — AB (ref <2.5)
Oxymorphone: NEGATIVE ng/mL (ref <2.5)
Phencyclidine: NEGATIVE ng/mL (ref <10)
TAPENTADOL: NEGATIVE ng/mL (ref <5.0)
TRAMADOL: NEGATIVE ng/mL (ref <5.0)
Zolpidem: NEGATIVE ng/mL (ref <5.0)

## 2017-04-29 LAB — DRUG TOX ALC METAB W/CON, ORAL FLD: ALCOHOL METABOLITE: NEGATIVE ng/mL (ref <25)

## 2017-05-01 ENCOUNTER — Telehealth: Payer: Self-pay | Admitting: *Deleted

## 2017-05-01 NOTE — Telephone Encounter (Signed)
Oral swab drug screen was consistent for prescribed medications. Clonazepam last taken 04/23/17 and test was done on 04/24/17.

## 2017-05-06 ENCOUNTER — Encounter: Payer: Self-pay | Admitting: Internal Medicine

## 2017-05-06 MED ORDER — CEPHALEXIN 500 MG PO CAPS: 500.0000 mg | ORAL_CAPSULE | Freq: Two times a day (BID) | ORAL | 0 refills | Status: DC

## 2017-05-06 NOTE — Telephone Encounter (Signed)
Called patient and she has been experiencing urinary urgency and frequency since last week. She denies fever but says she general feels not as good as usual. Will prescribe keflex for presumptive UTI but explained she would need to give a urine sample if not having improvement in symptoms.  Olene Floss, MD [place], PGY-3

## 2017-05-13 ENCOUNTER — Ambulatory Visit
Admission: RE | Admit: 2017-05-13 | Discharge: 2017-05-13 | Disposition: A | Discharge status: Home / Self Care | Payer: Medicaid Other | Source: Ambulatory Visit | Attending: Registered Nurse | Admitting: Registered Nurse

## 2017-05-13 ENCOUNTER — Other Ambulatory Visit: Payer: Self-pay | Admitting: Internal Medicine

## 2017-05-23 ENCOUNTER — Encounter: Payer: Medicaid Other | Attending: Physical Medicine & Rehabilitation | Admitting: Registered Nurse

## 2017-05-23 ENCOUNTER — Encounter: Payer: Self-pay | Admitting: Registered Nurse

## 2017-05-23 VITALS — BP 127/85 | HR 84 | Resp 14

## 2017-05-23 DIAGNOSIS — M79642 Pain in left hand: Secondary | ICD-10-CM | POA: Diagnosis not present

## 2017-05-23 DIAGNOSIS — M79641 Pain in right hand: Secondary | ICD-10-CM

## 2017-05-23 DIAGNOSIS — G629 Polyneuropathy, unspecified: Secondary | ICD-10-CM | POA: Diagnosis not present

## 2017-05-23 DIAGNOSIS — G8929 Other chronic pain: Secondary | ICD-10-CM

## 2017-05-23 DIAGNOSIS — F341 Dysthymic disorder: Secondary | ICD-10-CM | POA: Diagnosis not present

## 2017-05-23 DIAGNOSIS — M25561 Pain in right knee: Secondary | ICD-10-CM | POA: Insufficient documentation

## 2017-05-23 DIAGNOSIS — M62838 Other muscle spasm: Secondary | ICD-10-CM | POA: Diagnosis not present

## 2017-05-23 DIAGNOSIS — M25562 Pain in left knee: Secondary | ICD-10-CM | POA: Insufficient documentation

## 2017-05-23 DIAGNOSIS — Z79899 Other long term (current) drug therapy: Secondary | ICD-10-CM | POA: Diagnosis present

## 2017-05-23 DIAGNOSIS — G894 Chronic pain syndrome: Secondary | ICD-10-CM

## 2017-05-23 DIAGNOSIS — E1140 Type 2 diabetes mellitus with diabetic neuropathy, unspecified: Secondary | ICD-10-CM

## 2017-05-23 DIAGNOSIS — Z5181 Encounter for therapeutic drug level monitoring: Secondary | ICD-10-CM | POA: Insufficient documentation

## 2017-05-23 MED ORDER — MORPHINE SULFATE ER 30 MG PO T12A: 30.0000 mg | EXTENDED_RELEASE_TABLET | Freq: Two times a day (BID) | ORAL | 0 refills | Status: DC

## 2017-05-23 MED ORDER — CLONAZEPAM 0.5 MG PO TABS: ORAL_TABLET | ORAL | 2 refills | Status: DC

## 2017-05-23 MED ORDER — MORPHINE SULFATE 30 MG PO TABS: ORAL_TABLET | ORAL | 0 refills | Status: DC

## 2017-05-23 NOTE — Progress Notes (Signed)
Subjective:    Patient ID: [name], female    DOB: [birth year], 47 y.o.   MRN: [id number]  HPI: [name] is a 47year old female who returns for follow up appointment for chronic pain and medication refill. She states her pain is located in her bilateral hands, also reports she's been experiencing some clicking in her ears when she turns her head, PCP has been following. She will be scheduling an appointment with audiologist and if no improvement her PCP will be placing a referral to ENT, she reports.  [name] Morphine equivalent is 128.00 MME.  She is also prescribed Klonopin. We have reviewed the black box warning again regarding  using opioids and benzodiazepines. I highlighted the dangers of using these drugs together and discussed the adverse events including respiratory suppression, overdose, cognitive impairment and importance of  compliance with current regimen. She verbalizes understanding, we will continue to monitor and adjust as indicated.    [name] last UDS was on 01/25/2017, it was consistent.  Pain Inventory Average Pain 6 Pain Right Now 9 My pain is intermittent, constant, sharp, burning, dull, stabbing, tingling and aching  In the last 24 hours, has pain interfered with the following? General activity 9 Relation with others 9 Enjoyment of life 9 What TIME of day is your pain at its worst? morning and evening Sleep (in general) Poor  Pain is worse with: walking, bending, standing and some activites Pain improves with: rest and medication Relief from Meds: 7  Mobility walk without assistance walk with assistance use a cane how many minutes can you walk? 5 ability to climb steps?  no do you drive?  no needs help with transfers Do you have any goals in this area?  yes  Function disabled: date disabled . I need assistance with the following:  meal prep, household duties and shopping Do you have any goals in this area?   yes  Neuro/Psych weakness numbness tremor tingling trouble walking spasms dizziness depression anxiety  Prior Studies Any changes since last visit?  no  Physicians involved in your care Any changes since last visit?  no   Family History  Problem Relation Age of Onset  . Stroke Maternal Grandmother   . Colon cancer Maternal Grandmother   . Heart Problems Paternal Grandmother   . Dementia Paternal Grandmother   . Heart attack Paternal Grandfather    Social History   Socioeconomic History  . Marital status: Legally Separated    Spouse name: None  . Number of children: None  . Years of education: None  . Highest education level: None  Social Needs  . Financial resource strain: None  . Food insecurity - worry: None  . Food insecurity - inability: None  . Transportation needs - medical: None  . Transportation needs - non-medical: None  Occupational History  . Occupation: Disabled  Tobacco Use  . Smoking status: Never Smoker  . Smokeless tobacco: Never Used  Substance and Sexual Activity  . Alcohol use: No    Alcohol/week: 0.0 oz  . Drug use: No    Comment: rx opiods  . Sexual activity: None  Other Topics Concern  . None  Social History Narrative  . None   Past Surgical History:  Procedure Laterality Date  . ABDOMINOPLASTY    . CHOLECYSTECTOMY     Past Medical History:  Diagnosis Date  . DEPRESSION/ANXIETY 08/08/2009  . IBS 02/02/2010  . Polyneuropathy 12/04/2010  . Renal stones   .  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) 08/08/2009   BP 127/85 (BP Location: Left Arm, Patient Position: Sitting, Cuff Size: Normal)   Pulse 84   Resp 14   SpO2 97%   Opioid Risk Score:  6 Fall Risk Score:  `1  Depression screen PHQ 2/9  Depression screen [facility] 2/9 04/24/2017 03/22/2017 01/25/2017 11/29/2016 10/04/2016 09/17/2016 09/17/2016  Decreased Interest 3 3 3 3 3 3 3   Down, Depressed, Hopeless  3 3 3 3 3 2 3   PHQ - 2 Score 6 6 6 6 6 5 6   Altered sleeping - - - - - 3 -  Tired, decreased energy - - - - - 3 -  Change in appetite - - - - - 0 -  Feeling bad or failure about yourself  - - - - - 3 -  Trouble concentrating - - - - - 3 -  Moving slowly or fidgety/restless - - - - - 0 -  Suicidal thoughts - - - - - 1 -  PHQ-9 Score - - - - - 18 -  Difficult doing work/chores - - - - - - -  Some recent data might be hidden    Review of Systems  HENT: Negative.   Eyes: Negative.   Respiratory: Negative.   Cardiovascular: Negative.   Gastrointestinal: Negative.   Endocrine:       Blood sugars fluctuations  Genitourinary: Negative.   Musculoskeletal: Positive for arthralgias and gait problem.       Spasms  Skin: Negative.   Allergic/Immunologic: Negative.   Neurological: Positive for dizziness, tremors, weakness and numbness.       Tingling  Hematological: Negative.   Psychiatric/Behavioral: Positive for dysphoric mood. The patient is nervous/anxious.   All other systems reviewed and are negative.      Objective:   Physical Exam  Constitutional: She is oriented to person, place, and time. She appears well-developed and well-nourished.  HENT:  Head: Normocephalic and atraumatic.  Neck: Normal range of motion. Neck supple.  Cardiovascular: Normal rate and regular rhythm.  Pulmonary/Chest: Effort normal and breath sounds normal.  Musculoskeletal:  Normal Muscle Bulk and Muscle Testing Reveals: Upper Extremities:Full ROM and Muscle Strength 5/5 Lower Extremities:Full ROM and Muscle Strength 5/5 Arises from Table Slowly using straight cane for support Narrow Based  Gait  Neurological: She is alert and oriented to person, place, and time.  Skin: Skin is warm and dry.  Psychiatric: She has a normal mood and affect.  Nursing note and vitals reviewed.         Assessment & Plan:  1.Severe peripheral polyneuropathy of undetermined etiology.: 05/23/2017 Refilled:  Morphabond30 mg one tablet every 12 hours #60 and MSIR 30mg  one tablet at HS, may take a tablet during the day as needed #60. We will continue the opioid monitoring program, this consists of regular clinic visits, examinations, urine drug screen, pill counts as well as use of [place] Controlled Substance Reporting System. 2. Bilateral progressive upper extremity hand pain/ Polyneuropathy.Continue with current medication regime. Tight Glucose Control. Adhere to healthy diet regime. 05/23/2017 3. Muscle Spasms: Continue Zanaflex. 05/23/2017 4. Depression/Anxiety: Continue Paxil and Klonopin. 05/23/2017  20 minutes of face to face patient care time was spent during this visit. All questions were encouraged and answered.  F/U in 1 month

## 2017-05-24 ENCOUNTER — Encounter: Payer: Self-pay | Admitting: Registered Nurse

## 2017-05-28 ENCOUNTER — Encounter: Payer: Self-pay | Admitting: Internal Medicine

## 2017-05-28 ENCOUNTER — Telehealth: Payer: Self-pay | Admitting: *Deleted

## 2017-05-28 NOTE — Telephone Encounter (Signed)
Prior Authorization submitted to [facility] for morphabond 30 mg ER and approved.

## 2017-05-30 ENCOUNTER — Other Ambulatory Visit: Payer: Self-pay | Admitting: Internal Medicine

## 2017-05-30 DIAGNOSIS — H9313 Tinnitus, bilateral: Secondary | ICD-10-CM

## 2017-06-06 ENCOUNTER — Ambulatory Visit: Payer: Medicaid Other | Admitting: Podiatry

## 2017-06-06 ENCOUNTER — Encounter: Payer: Self-pay | Admitting: Podiatry

## 2017-06-18 ENCOUNTER — Encounter: Payer: Self-pay | Admitting: Registered Nurse

## 2017-06-18 ENCOUNTER — Encounter: Payer: Medicaid Other | Attending: Physical Medicine & Rehabilitation | Admitting: Registered Nurse

## 2017-06-18 ENCOUNTER — Other Ambulatory Visit: Payer: Self-pay

## 2017-06-18 VITALS — BP 122/85 | HR 99 | Ht 72.0 in | Wt 280.0 lb

## 2017-06-18 DIAGNOSIS — Z5181 Encounter for therapeutic drug level monitoring: Secondary | ICD-10-CM | POA: Diagnosis present

## 2017-06-18 DIAGNOSIS — G629 Polyneuropathy, unspecified: Secondary | ICD-10-CM

## 2017-06-18 DIAGNOSIS — E1140 Type 2 diabetes mellitus with diabetic neuropathy, unspecified: Secondary | ICD-10-CM | POA: Diagnosis not present

## 2017-06-18 DIAGNOSIS — M79641 Pain in right hand: Secondary | ICD-10-CM

## 2017-06-18 DIAGNOSIS — M79642 Pain in left hand: Secondary | ICD-10-CM | POA: Diagnosis not present

## 2017-06-18 DIAGNOSIS — M25562 Pain in left knee: Secondary | ICD-10-CM

## 2017-06-18 DIAGNOSIS — F341 Dysthymic disorder: Secondary | ICD-10-CM | POA: Diagnosis not present

## 2017-06-18 DIAGNOSIS — M62838 Other muscle spasm: Secondary | ICD-10-CM

## 2017-06-18 DIAGNOSIS — Z79899 Other long term (current) drug therapy: Secondary | ICD-10-CM

## 2017-06-18 DIAGNOSIS — G894 Chronic pain syndrome: Secondary | ICD-10-CM | POA: Diagnosis not present

## 2017-06-18 DIAGNOSIS — G8929 Other chronic pain: Secondary | ICD-10-CM | POA: Diagnosis not present

## 2017-06-18 DIAGNOSIS — M25561 Pain in right knee: Secondary | ICD-10-CM | POA: Insufficient documentation

## 2017-06-18 MED ORDER — MORPHINE SULFATE ER 30 MG PO T12A: 30.0000 mg | EXTENDED_RELEASE_TABLET | Freq: Two times a day (BID) | ORAL | 0 refills | Status: DC

## 2017-06-18 MED ORDER — AMITRIPTYLINE HCL 10 MG PO TABS: 10.0000 mg | ORAL_TABLET | Freq: Every day | ORAL | 2 refills | Status: DC

## 2017-06-18 MED ORDER — MORPHINE SULFATE 30 MG PO TABS: ORAL_TABLET | ORAL | 0 refills | Status: DC

## 2017-06-18 NOTE — Patient Instructions (Signed)
Serotonin Syndrome Serotonin is a brain chemical that regulates the nervous system, which includes the brain, spinal cord, and nerves. Serotonin appears to play a role in all types of behavior, including appetite, emotions, movement, thinking, and response to stress. Excessively high levels of serotonin in the body can cause serotonin syndrome, which is a very dangerous condition. What are the causes? This condition can be caused by taking medicines or drugs that increase the level of serotonin in your body. These include:  Antidepressant medicines.  Migraine medicines.  Certain pain medicines.  Certain recreational drugs, including ecstasy, LSD, cocaine, and amphetamines.  Over-the-counter cough or cold medicines that contain dextromethorphan.  Certain herbal supplements, including St. John's wort, ginseng, and nutmeg.  This condition usually occurs when you take these medicines or drugs in combination, but it can also happen with a high dose of a single medicine or drug. What increases the risk? This condition is more likely to develop in:  People who have recently increased the dosage of medicine that increases the serotonin level.  People who just started taking medicine that increases the serotonin level.  What are the signs or symptoms? Symptoms of this condition usually happens within several hours of a medicine change. Symptoms include:  Headache.  Muscle twitching or stiffness.  Diarrhea.  Confusion.  Restlessness or agitation.  Shivering or goose bumps.  Loss of muscle coordination.  Rapid heart rate.  Sweating.  Severe cases of serotonin syndromecan cause:  Irregular heartbeat.  Seizures.  Loss of consciousness.  High fever.  How is this diagnosed? This condition is diagnosed with a medical history and physical exam. You will be asked aboutyour symptoms and your use of medicines and recreational drugs. Your health care provider may also order lab  work or additional tests to rule out other causes of your symptoms. How is this treated? The treatment for this condition depends on the severity of your symptoms. For mild cases, stopping the medicine that caused your condition is usually all that is needed. For moderate to severe cases, hospitalization is required to monitor you and to prevent further muscle damage. Follow these instructions at home:  Take over-the-counter and prescription medicines only as told by your health care provider. This is important.  Check with your health care provider before you start taking any new prescriptions, over-the-counter medicines, herbs, or supplements.  Avoid combining any medicines that can cause this condition to occur.  Keep all follow-up visits as told by your health care provider.This is important.  Maintain a healthy lifestyle. ? Eat healthy foods. ? Get plenty of sleep. ? Exercise regularly. ? Do not drink alcohol. ? Do not use recreational drugs. Contact a health care provider if:  Medicines do not seem to be helping.  Your symptoms do not improve or they get worse.  You have trouble taking care of yourself. Get help right away if:  You have worsening confusion, severe headache, chest pain, high fever, seizures, or loss of consciousness.  You have serious thoughts about hurting yourself or others.  You experience serious side effects of medicine, such as swelling of your face, lips, tongue, or throat. This information is not intended to replace advice given to you by your health care provider. Make sure you discuss any questions you have with your health care provider. Document Released: 04/19/2004 Document Revised: 11/05/2015 Document Reviewed: 03/25/2014 Elsevier Interactive Patient Education  2018 [employer]  

## 2017-06-18 NOTE — Progress Notes (Signed)
Subjective:    Patient ID: [name], female    DOB: Sep 07, 1970, 47 y.o.   MRN: [id number]  HPI: [name] is a 47 year old female who returns for follow up appointment and medication refill. She states her pain is located in her bilateral hands, bilateral knees and bilateral feet with tingling and burning. [name] repots increase neuropathic pain, we will prescribe amitriptyline, reviewed serotonin syndrome instructed to call office  Also states she would like to see orthopedist, referral will be placed, she verbalizes understanding.   [name] Morphine equivalent is 118.00 MME.  She is also prescribed Klonopin. We have reviewed the black box warning again regarding  using opioids and benzodiazepines. I highlighted the dangers of using these drugs together and discussed the adverse events including respiratory suppression, overdose, cognitive impairment and importance of  compliance with current regimen. She verbalizes understanding, we will continue to monitor and adjust as indicated.   [name] last  Oral Swab was on 04/24/2017, it was consistent.   Pain Inventory Average Pain 6 Pain Right Now 6 My pain is constant, sharp, burning, dull, stabbing, tingling and aching  In the last 24 hours, has pain interfered with the following? General activity 7 Relation with others 7 Enjoyment of life 7 What TIME of day is your pain at its worst? evening Sleep (in general) NA  Pain is worse with:walking bending standing some activities Pain improves with: medication and massage Relief from Meds: 6  Mobility walk without assistance walk with assistance use a cane how many minutes can you walk? 5-10 ability to climb steps?  no do you drive?  no use a wheelchair  Function disabled: date disabled n/a  Neuro/Psych weakness numbness tremor tingling trouble walking spasms dizziness depression anxiety  Prior Studies Any changes since last  visit?  no  Physicians involved in your care Any changes since last visit?  no   Family History  Problem Relation Age of Onset  . Stroke Maternal Grandmother   . Colon cancer Maternal Grandmother   . Heart Problems Paternal Grandmother   . Dementia Paternal Grandmother   . Heart attack Paternal Grandfather    Social History   Socioeconomic History  . Marital status: Legally Separated    Spouse name: Not on file  . Number of children: Not on file  . Years of education: Not on file  . Highest education level: Not on file  Occupational History  . Occupation: Disabled  Social Needs  . Financial resource strain: Not on file  . Food insecurity:    Worry: Not on file    Inability: Not on file  . Transportation needs:    Medical: Not on file    Non-medical: Not on file  Tobacco Use  . Smoking status: Never Smoker  . Smokeless tobacco: Never Used  Substance and Sexual Activity  . Alcohol use: No    Alcohol/week: 0.0 oz  . Drug use: No    Comment: rx opiods  . Sexual activity: Not on file  Lifestyle  . Physical activity:    Days per week: Not on file    Minutes per session: Not on file  . Stress: Not on file  Relationships  . Social connections:    Talks on phone: Not on file    Gets together: Not on file    Attends religious service: Not on file    Active member of club or organization: Not on file  Attends meetings of clubs or organizations: Not on file    Relationship status: Not on file  Other Topics Concern  . Not on file  Social History Narrative  . Not on file   Past Surgical History:  Procedure Laterality Date  . ABDOMINOPLASTY    . CHOLECYSTECTOMY     Past Medical History:  Diagnosis Date  . DEPRESSION/ANXIETY 08/08/2009  . IBS 02/02/2010  . Polyneuropathy 12/04/2010  . Renal stones   . 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) 08/08/2009   BP 122/85   Pulse 99   Ht 6'  (1.829 m)   Wt 280 lb (127 kg)   SpO2 98%   BMI 37.97 kg/m   Opioid Risk Score:   Fall Risk Score:  `1  Depression screen PHQ 2/9  Depression screen [facility] 2/9 06/18/2017 04/24/2017 03/22/2017 01/25/2017 11/29/2016 10/04/2016 09/17/2016  Decreased Interest 1 3 3 3 3 3 3   Down, Depressed, Hopeless 1 3 3 3 3 3 2   PHQ - 2 Score 2 6 6 6 6 6 5   Altered sleeping - - - - - - 3  Tired, decreased energy - - - - - - 3  Change in appetite - - - - - - 0  Feeling bad or failure about yourself  - - - - - - 3  Trouble concentrating - - - - - - 3  Moving slowly or fidgety/restless - - - - - - 0  Suicidal thoughts - - - - - - 1  PHQ-9 Score - - - - - - 18  Difficult doing work/chores - - - - - - -  Some recent data might be hidden   Review of Systems  Constitutional: Negative.   HENT: Negative.   Eyes: Negative.   Respiratory: Negative.   Cardiovascular: Negative.   Endocrine: Negative.   Genitourinary: Negative.   Musculoskeletal: Negative.   Skin: Negative.   Allergic/Immunologic: Negative.   Neurological: Negative.   Hematological: Negative.   Psychiatric/Behavioral: Negative.   All other systems reviewed and are negative.      Objective:   Physical Exam  Constitutional: She is oriented to person, place, and time. She appears well-developed and well-nourished.  HENT:  Head: Normocephalic and atraumatic.  Neck: Normal range of motion. Neck supple.  Cardiovascular: Normal rate and regular rhythm.  Pulmonary/Chest: Breath sounds normal. No respiratory distress.  Musculoskeletal:  Normal Muscle Bulk and Muscle Testing Reveals: Upper Extremities: Full ROM and Muscle Strength 4/5 Lower Extremities: Full ROM and Muscle Strength 5/5 Arises from Table slowly Narrow Based Gait    Neurological: She is alert and oriented to person, place, and time.  Skin: Skin is warm and dry.  Psychiatric: She has a normal mood and affect.  Nursing note and vitals reviewed.         Assessment &  Plan:  1.Severe peripheral polyneuropathy of undetermined etiology.: 06/18/2017 Refilled: Morphabond30 mg one tablet every 12 hours #60 and MSIR 30mg  one tablet at HS, may take a tablet during the day as needed #60. We will continue the opioid monitoring program, this consists of regular clinic visits, examinations, urine drug screen, pill counts as well as use of [place] Controlled Substance Reporting System. 2. Bilateral progressive upper extremity hand pain/ Polyneuropathy.Continue with current medication regime. Tight Glucose Control. Adhere to healthy diet regime. RX: Amitriptyline. 06/18/2017 3. Muscle Spasms: Continue Zanaflex. 06/18/2017 4. Depression/Anxiety: Continue Paxil and Klonopin. 06/18/2017 5. Bilateral Chronic  Knee Pain: orthopedist Referral per Patient's Request.   20 minutes of face to face patient care time was spent during this visit. All questions were encouraged and answered.  F/U in 1 month

## 2017-06-27 ENCOUNTER — Encounter: Payer: Self-pay | Admitting: Registered Nurse

## 2017-07-04 ENCOUNTER — Ambulatory Visit: Payer: Medicaid Other | Admitting: Internal Medicine

## 2017-07-08 ENCOUNTER — Other Ambulatory Visit: Payer: Self-pay | Admitting: Physical Medicine & Rehabilitation

## 2017-07-10 ENCOUNTER — Encounter: Payer: Self-pay | Admitting: Registered Nurse

## 2017-07-11 ENCOUNTER — Other Ambulatory Visit: Payer: Self-pay | Admitting: Internal Medicine

## 2017-07-11 ENCOUNTER — Ambulatory Visit: Payer: Medicaid Other | Admitting: Internal Medicine

## 2017-07-11 ENCOUNTER — Encounter: Payer: Self-pay | Admitting: Internal Medicine

## 2017-07-11 ENCOUNTER — Other Ambulatory Visit: Payer: Self-pay

## 2017-07-11 VITALS — BP 118/72 | HR 85 | Temp 99.1°F | Ht 72.0 in | Wt 277.0 lb

## 2017-07-11 DIAGNOSIS — E781 Pure hyperglyceridemia: Secondary | ICD-10-CM | POA: Diagnosis not present

## 2017-07-11 DIAGNOSIS — R7989 Other specified abnormal findings of blood chemistry: Secondary | ICD-10-CM

## 2017-07-11 DIAGNOSIS — E1165 Type 2 diabetes mellitus with hyperglycemia: Secondary | ICD-10-CM

## 2017-07-11 DIAGNOSIS — E785 Hyperlipidemia, unspecified: Secondary | ICD-10-CM

## 2017-07-11 DIAGNOSIS — R300 Dysuria: Secondary | ICD-10-CM | POA: Diagnosis not present

## 2017-07-11 LAB — POCT URINALYSIS DIP (MANUAL ENTRY)
BILIRUBIN UA: NEGATIVE
Glucose, UA: 100 mg/dL — AB
LEUKOCYTES UA: NEGATIVE
NITRITE UA: NEGATIVE
PH UA: 5.5 (ref 5.0–8.0)
RBC UA: NEGATIVE
Spec Grav, UA: 1.02 (ref 1.010–1.025)
Urobilinogen, UA: 0.2 E.U./dL

## 2017-07-11 LAB — POCT UA - MICROSCOPIC ONLY

## 2017-07-11 LAB — POCT GLYCOSYLATED HEMOGLOBIN (HGB A1C): HEMOGLOBIN A1C: 8.6

## 2017-07-11 MED ORDER — METFORMIN HCL ER (OSM) 1000 MG PO TB24: 2000.0000 mg | ORAL_TABLET | Freq: Every day | ORAL | 1 refills | Status: DC

## 2017-07-11 MED ORDER — GLIPIZIDE 10 MG PO TABS: 10.0000 mg | ORAL_TABLET | Freq: Every day | ORAL | 0 refills | Status: DC

## 2017-07-11 MED ORDER — LIRAGLUTIDE 18 MG/3ML ~~LOC~~ SOPN: 0.6000 mg | PEN_INJECTOR | Freq: Every day | SUBCUTANEOUS | 0 refills | Status: DC

## 2017-07-11 NOTE — Patient Instructions (Signed)
Ms. Lusher,  I will message with lab results.  Start victoza 0.6 mg daily. Start long-acting metformin 2000 mg daily--I may need to do a prior authorization. Decrease glipizide to 10 mg daily at least.  Best, Dr. Ola Spurr

## 2017-07-12 ENCOUNTER — Other Ambulatory Visit: Payer: Self-pay | Admitting: Internal Medicine

## 2017-07-12 LAB — LIPID PANEL
CHOL/HDL RATIO: 3.3 ratio (ref 0.0–4.4)
Cholesterol, Total: 98 mg/dL — ABNORMAL LOW (ref 100–199)
HDL: 30 mg/dL — AB (ref >39)
Triglycerides: 469 mg/dL — ABNORMAL HIGH (ref 0–149)

## 2017-07-12 LAB — VITAMIN D 25 HYDROXY (VIT D DEFICIENCY, FRACTURES): VIT D 25 HYDROXY: 23.6 ng/mL — AB (ref 30.0–100.0)

## 2017-07-13 LAB — URINE CULTURE

## 2017-07-14 ENCOUNTER — Encounter: Payer: Self-pay | Admitting: Internal Medicine

## 2017-07-14 NOTE — Assessment & Plan Note (Signed)
-   Will check lipid panel. 

## 2017-07-14 NOTE — Assessment & Plan Note (Addendum)
-   A1c 8.6 today, compared to 8.7 three months ago and 7.4 about 10 months ago. - Had discussed changes in therapy at last appointment, including trulicity. However, this would not be covered by patient's insurance. Suggested starting victoza 0.6 mg daily for A1c reduction and potential weight and lipid lowering effects. Think this would be better option than SGLT2 inhibitors due to already having issue with dizziness and increased frequency of urination.  - Pharmacy student Gerald Stabs counseled patient on how to use victoza - Decrease glipizide use to once daily. - Will switch metformin to long-acting in hopes of improving reported GI side effects - Ordered UA (and culture) given recent treatment for presumed UTI with ongoing frequency--may be related to worsened CBG control

## 2017-07-14 NOTE — Progress Notes (Signed)
[facility] Progress Note  Subjective:  [name] is a 47 y.o. female with history of HTN, tinnitus, chronic pain, HLD, T2DM and IBS who presents for diabetes follow-up and labs for hypertriglyceridemia.   #T2DM: - Taking metformin 1000 mg BID, glipizide 10 mg BID. Has some GI upset with metformin but thinks may be more related to IBS and has improved since initially started.  - Reports sugars have been higher than usual and recounts some CBGs of 178 and 236 but says fasting sugars are usually around 100 - Has been watching carbohydrate intake more. Because she is a vegetarian, pasta had been a frequent staple but she is now limiting this to 1-2 times a week - Exercise limited by chronic polyneuropathy for which she follows with PMnR - Wonders if recent addition of amitriptyline to pain regimen could be contributing to elevated CBGs ROS: ongoing dizziness but thought related to ongoing tinnitus, some increased urinary frequency  #HLD: - Is on lipitor 80 mg and tolerating well - Has had very elevated triglycerides and wondering if change in therapy needed  Allergies  Allergen Reactions  . Codeine     confusion  . Vancomycin     Red Man's syndrome  . Nucynta [Tapentadol] Other (See Comments)    headaches    Social History   Tobacco Use  . Smoking status: Never Smoker  . Smokeless tobacco: Never Used  Substance Use Topics  . Alcohol use: No    Alcohol/week: 0.0 oz    Objective: Blood pressure 118/72, pulse 85, temperature 99.1 F (37.3 C), temperature source Oral, height 6' (1.829 m), weight 277 lb (125.6 kg), SpO2 97 %. Body mass index is 37.57 kg/m. Constitutional: Pleasant female, sitting in chair HENT: Left TM with perhaps some mild sclerosis, right TM normal Cardiovascular: RRR, S1, S2, no m/r/g.  Pulmonary/Chest: Effort normal and breath sounds normal.  Musculoskeletal: No LE edema Vitals reviewed  Assessment/Plan: DM2 (diabetes  mellitus, type 2) - A1c 8.6 today, compared to 8.7 three months ago and 7.4 about 10 months ago. - Had discussed changes in therapy at last appointment, including trulicity. However, this would not be covered by patient's insurance. Suggested starting victoza 0.6 mg daily for A1c reduction and potential weight and lipid lowering effects. Think this would be better option than SGLT2 inhibitors due to already having issue with dizziness and increased frequency of urination.  - Pharmacy student Gerald Stabs counseled patient on how to use victoza - Decrease glipizide use to once daily. - Will switch metformin to long-acting in hopes of improving reported GI side effects - Ordered UA (and culture) given recent treatment for presumed UTI with ongoing frequency--may be related to worsened CBG control  Hypertriglyceridemia - Will check lipid panel  Will also check vitamin D level to monitor for improvement with supplementation.   Follow-up in about 1 month to discuss blood sugar control.  Olene Floss, MD [place], PGY-3

## 2017-07-15 ENCOUNTER — Other Ambulatory Visit: Payer: Self-pay | Admitting: Internal Medicine

## 2017-07-15 ENCOUNTER — Encounter: Payer: Self-pay | Admitting: Internal Medicine

## 2017-07-15 MED ORDER — INSULIN PEN NEEDLE 32G X 4 MM MISC: 1.0000 | Freq: Every day | 1 refills | Status: DC

## 2017-07-15 MED ORDER — METFORMIN HCL ER 500 MG PO TB24: 2000.0000 mg | ORAL_TABLET | Freq: Every day | ORAL | 1 refills | Status: DC

## 2017-07-16 ENCOUNTER — Other Ambulatory Visit: Payer: Self-pay

## 2017-07-17 ENCOUNTER — Telehealth: Payer: Self-pay | Admitting: Registered Nurse

## 2017-07-17 ENCOUNTER — Encounter: Payer: Self-pay | Admitting: Registered Nurse

## 2017-07-17 MED ORDER — MORPHINE SULFATE 30 MG PO TABS: ORAL_TABLET | ORAL | 0 refills | Status: DC

## 2017-07-17 MED ORDER — LIRAGLUTIDE 18 MG/3ML ~~LOC~~ SOPN: 1.2000 mg | PEN_INJECTOR | Freq: Every day | SUBCUTANEOUS | 0 refills | Status: DC

## 2017-07-17 NOTE — Telephone Encounter (Signed)
MSIR was e-scribe, Message sent to Ms. Cunnigham via [place] Chart Message. She has a scheduled appointment on 07/19/2017. She was instructed to keep her appointment.

## 2017-07-18 ENCOUNTER — Encounter: Payer: Self-pay | Admitting: Registered Nurse

## 2017-07-19 ENCOUNTER — Encounter: Payer: Medicaid Other | Admitting: Registered Nurse

## 2017-07-23 ENCOUNTER — Encounter: Payer: Medicaid Other | Attending: Physical Medicine & Rehabilitation | Admitting: Registered Nurse

## 2017-07-23 ENCOUNTER — Encounter: Payer: Self-pay | Admitting: Registered Nurse

## 2017-07-23 VITALS — BP 129/87 | HR 100 | Ht 71.0 in | Wt 279.0 lb

## 2017-07-23 DIAGNOSIS — G894 Chronic pain syndrome: Secondary | ICD-10-CM | POA: Diagnosis not present

## 2017-07-23 DIAGNOSIS — Z5181 Encounter for therapeutic drug level monitoring: Secondary | ICD-10-CM | POA: Diagnosis present

## 2017-07-23 DIAGNOSIS — M25561 Pain in right knee: Secondary | ICD-10-CM | POA: Insufficient documentation

## 2017-07-23 DIAGNOSIS — M79642 Pain in left hand: Secondary | ICD-10-CM

## 2017-07-23 DIAGNOSIS — Z79899 Other long term (current) drug therapy: Secondary | ICD-10-CM | POA: Diagnosis present

## 2017-07-23 DIAGNOSIS — G629 Polyneuropathy, unspecified: Secondary | ICD-10-CM

## 2017-07-23 DIAGNOSIS — E1140 Type 2 diabetes mellitus with diabetic neuropathy, unspecified: Secondary | ICD-10-CM | POA: Diagnosis not present

## 2017-07-23 DIAGNOSIS — G8929 Other chronic pain: Secondary | ICD-10-CM | POA: Diagnosis not present

## 2017-07-23 DIAGNOSIS — M79641 Pain in right hand: Secondary | ICD-10-CM | POA: Diagnosis not present

## 2017-07-23 DIAGNOSIS — F341 Dysthymic disorder: Secondary | ICD-10-CM

## 2017-07-23 DIAGNOSIS — M25562 Pain in left knee: Secondary | ICD-10-CM | POA: Insufficient documentation

## 2017-07-23 DIAGNOSIS — M62838 Other muscle spasm: Secondary | ICD-10-CM | POA: Diagnosis not present

## 2017-07-23 MED ORDER — AMITRIPTYLINE HCL 25 MG PO TABS: 25.0000 mg | ORAL_TABLET | Freq: Every day | ORAL | 2 refills | Status: DC

## 2017-07-23 MED ORDER — MORPHINE SULFATE ER 30 MG PO T12A: 30.0000 mg | EXTENDED_RELEASE_TABLET | Freq: Two times a day (BID) | ORAL | 0 refills | Status: DC

## 2017-07-23 NOTE — Progress Notes (Signed)
Subjective:    Patient ID: [name], female    DOB: 1970-08-18, 47 y.o.   MRN: [id number]  HPI: [name] is a 47 year old female who returns for follow up appointment for chronic pain and medication refill. She states her pain is located in her bilateral hands, bilateral feet with tingling and numbness and bilateral knee pain. She rates her pain 8. Her current exercise regime is walking.   [name] Morphine equivalent is 124.00 MME. She is also prescribed Klonopin.We have discussed the black box warning of using opioids and benzodiazepines. I highlighted the dangers of using these drugs together and discussed the adverse events including respiratory suppression, overdose, cognitive impairment and importance of  compliance with current regimen. She verbalizes understanding, we will continue to monitor and adjust as indicated.    Last oral Swab was performed on 04/24/2017 it was consistent.    Pain Inventory Average Pain 8 Pain Right Now 8 My pain is constant, sharp, burning, dull, stabbing, tingling and aching  In the last 24 hours, has pain interfered with the following? General activity 8 Relation with others 8 Enjoyment of life 8 What TIME of day is your pain at its worst? morning, night Sleep (in general) Poor  Pain is worse with: walking, bending, standing and some activites Pain improves with: rest and medication Relief from Meds: 6  Mobility walk without assistance walk with assistance use a cane how many minutes can you walk? 5-10 ability to climb steps?  no do you drive?  no Do you have any goals in this area?  yes  Function disabled: date disabled . I need assistance with the following:  meal prep, household duties and shopping  Neuro/Psych weakness numbness tremor tingling trouble walking dizziness depression anxiety  Prior Studies Any changes since last visit?  no  Physicians involved in your care Any changes since  last visit?  no   Family History  Problem Relation Age of Onset  . Stroke Maternal Grandmother   . Colon cancer Maternal Grandmother   . Heart Problems Paternal Grandmother   . Dementia Paternal Grandmother   . Heart attack Paternal Grandfather    Social History   Socioeconomic History  . Marital status: Legally Separated    Spouse name: Not on file  . Number of children: Not on file  . Years of education: Not on file  . Highest education level: Not on file  Occupational History  . Occupation: Disabled  Social Needs  . Financial resource strain: Not on file  . Food insecurity:    Worry: Not on file    Inability: Not on file  . Transportation needs:    Medical: Not on file    Non-medical: Not on file  Tobacco Use  . Smoking status: Never Smoker  . Smokeless tobacco: Never Used  Substance and Sexual Activity  . Alcohol use: No    Alcohol/week: 0.0 oz  . Drug use: No    Comment: rx opiods  . Sexual activity: Not on file  Lifestyle  . Physical activity:    Days per week: Not on file    Minutes per session: Not on file  . Stress: Not on file  Relationships  . Social connections:    Talks on phone: Not on file    Gets together: Not on file    Attends religious service: Not on file    Active member of club or organization: Not on file  Attends meetings of clubs or organizations: Not on file    Relationship status: Not on file  Other Topics Concern  . Not on file  Social History Narrative  . Not on file   Past Surgical History:  Procedure Laterality Date  . ABDOMINOPLASTY    . CHOLECYSTECTOMY     Past Medical History:  Diagnosis Date  . DEPRESSION/ANXIETY 08/08/2009  . IBS 02/02/2010  . Polyneuropathy 12/04/2010  . Renal stones   . 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) 08/08/2009   BP 129/87 (BP Location: Right Arm, Patient Position: Sitting, Cuff Size: Normal)   Pulse 100   Ht  5\' 11"  (1.803 m)   Wt 279 lb (126.6 kg)   SpO2 98%   BMI 38.91 kg/m   Opioid Risk Score:   Fall Risk Score:  `1  Depression screen PHQ 2/9  Depression screen [facility] 2/9 07/11/2017 06/18/2017 04/24/2017 03/22/2017 01/25/2017 11/29/2016 10/04/2016  Decreased Interest 0 1 3 3 3 3 3   Down, Depressed, Hopeless 0 1 3 3 3 3 3   PHQ - 2 Score 0 2 6 6 6 6 6   Altered sleeping - - - - - - -  Tired, decreased energy - - - - - - -  Change in appetite - - - - - - -  Feeling bad or failure about yourself  - - - - - - -  Trouble concentrating - - - - - - -  Moving slowly or fidgety/restless - - - - - - -  Suicidal thoughts - - - - - - -  PHQ-9 Score - - - - - - -  Difficult doing work/chores - - - - - - -  Some recent data might be hidden    Review of Systems  Constitutional: Negative.   HENT: Negative.   Eyes: Negative.   Respiratory: Negative.   Cardiovascular: Negative.   Gastrointestinal: Negative.   Endocrine: Negative.   Genitourinary: Negative.   Musculoskeletal: Positive for arthralgias.  Skin: Negative.   Allergic/Immunologic: Negative.   Neurological: Positive for dizziness, tremors, weakness and numbness.       Tingling  Hematological: Negative.   Psychiatric/Behavioral: Positive for dysphoric mood. The patient is nervous/anxious.        Objective:   Physical Exam  Constitutional: She is oriented to person, place, and time. She appears well-developed and well-nourished.  HENT:  Head: Normocephalic and atraumatic.  Neck: Normal range of motion. Neck supple.  Cardiovascular: Normal rate and regular rhythm.  Pulmonary/Chest: Effort normal and breath sounds normal.  Musculoskeletal:  Normal Muscle Bulk and Muscle Testing Reveals: Upper Extremities: Full ROM and Muscle Strength 5/5 Lower Extremities: Full ROM and Muscle Strength 5/5 Arises from chair with ease Narrow Based gait  Neurological: She is alert and oriented to person, place, and time.  Skin: Skin is warm and dry.    Psychiatric: She has a normal mood and affect.  Nursing note and vitals reviewed.         Assessment & Plan:  1.Severe peripheral polyneuropathy of undetermined etiology.: 07/23/2017 Refilled: Morphabond30 mg one tablet every 12 hours #60 and continue  MSIR 30mg  one tablet at HS, may take a tablet during the day as needed #60. We will continue the opioid monitoring program, this consists of regular clinic visits, examinations, urine drug screen, pill counts as well as use of [place] Controlled Substance Reporting System. 2. Bilateral progressive upper extremity hand pain/ Polyneuropathy.Continue  with current medication regime. Tight Glucose Control. Adhere to healthy diet regime. Continue with current medication regimen with :Amitriptyline: Dosage Increased. 07/23/2017 3. Muscle Spasms: Continue current medication regimen with  Zanaflex. 07/23/2017 4. Depression/Anxiety: Continue current medication regimen with Paxil and Klonopin. 07/23/2017 5. Bilateral Chronic Knee Pain: Continue HEP as Tolerated and Continue to Monitor. 07/23/2017.  20 minutes of face to face patient care time was spent during this visit. All questions were encouraged and answered.  F/U in 1 month

## 2017-07-23 NOTE — Progress Notes (Signed)
   Subjective:    Patient ID: [name], female    DOB: Aug 21, 1970, 47 y.o.   MRN: [id number]  HPI    Review of Systems     Objective:   Physical Exam        Assessment & Plan:

## 2017-07-30 ENCOUNTER — Ambulatory Visit: Payer: Medicaid Other | Admitting: Certified Nurse Midwife

## 2017-08-02 ENCOUNTER — Encounter: Payer: Self-pay | Admitting: Internal Medicine

## 2017-08-06 ENCOUNTER — Encounter: Payer: Self-pay | Admitting: Registered Nurse

## 2017-08-06 ENCOUNTER — Encounter: Payer: Self-pay | Admitting: Internal Medicine

## 2017-08-06 ENCOUNTER — Other Ambulatory Visit: Payer: Self-pay | Admitting: Internal Medicine

## 2017-08-06 MED ORDER — GLIPIZIDE 10 MG PO TABS: 10.0000 mg | ORAL_TABLET | Freq: Two times a day (BID) | ORAL | 3 refills | Status: DC

## 2017-08-06 MED ORDER — METFORMIN HCL 1000 MG PO TABS: 1000.0000 mg | ORAL_TABLET | Freq: Two times a day (BID) | ORAL | 3 refills | Status: DC

## 2017-08-13 ENCOUNTER — Ambulatory Visit: Payer: Medicaid Other | Admitting: Physical Medicine & Rehabilitation

## 2017-08-13 ENCOUNTER — Encounter: Payer: Medicaid Other | Attending: Physical Medicine & Rehabilitation

## 2017-08-13 ENCOUNTER — Encounter: Payer: Self-pay | Admitting: Physical Medicine & Rehabilitation

## 2017-08-13 VITALS — BP 119/77 | HR 102 | Ht 71.0 in | Wt 278.0 lb

## 2017-08-13 DIAGNOSIS — M25562 Pain in left knee: Secondary | ICD-10-CM | POA: Diagnosis present

## 2017-08-13 DIAGNOSIS — M25561 Pain in right knee: Secondary | ICD-10-CM | POA: Diagnosis present

## 2017-08-13 DIAGNOSIS — E1140 Type 2 diabetes mellitus with diabetic neuropathy, unspecified: Secondary | ICD-10-CM | POA: Diagnosis not present

## 2017-08-13 DIAGNOSIS — Z79899 Other long term (current) drug therapy: Secondary | ICD-10-CM

## 2017-08-13 DIAGNOSIS — Z5181 Encounter for therapeutic drug level monitoring: Secondary | ICD-10-CM | POA: Diagnosis not present

## 2017-08-13 DIAGNOSIS — G894 Chronic pain syndrome: Secondary | ICD-10-CM | POA: Diagnosis not present

## 2017-08-13 MED ORDER — MORPHINE SULFATE ER 30 MG PO T12A: 30.0000 mg | EXTENDED_RELEASE_TABLET | Freq: Two times a day (BID) | ORAL | 0 refills | Status: DC

## 2017-08-13 MED ORDER — CLONAZEPAM 0.5 MG PO TABS: ORAL_TABLET | ORAL | 2 refills | Status: DC

## 2017-08-13 MED ORDER — MORPHINE SULFATE 30 MG PO TABS: ORAL_TABLET | ORAL | 0 refills | Status: DC

## 2017-08-13 NOTE — Progress Notes (Signed)
Subjective:    Patient ID: [name], female    DOB: Aug 20, 1970, 47 y.o.   MRN: [id number]  HPI Now on Victoza, working on [employer] housework Drove today  Pain Inventory Average Pain 8 Pain Right Now 8 My pain is constant, sharp, burning, dull, stabbing, tingling and aching  In the last 24 hours, has pain interfered with the following? General activity 7 Relation with others 9 Enjoyment of life 8 What TIME of day is your pain at its worst? evening Sleep (in general) Poor  Pain is worse with: walking, standing, some activites and . Pain improves with: rest and medication Relief from Meds: 6  Mobility walk without assistance walk with assistance use a cane ability to climb steps?  no do you drive?  no  Function disabled: date disabled . I need assistance with the following:  household duties and shopping  Neuro/Psych weakness numbness tremor tingling trouble walking spasms dizziness depression anxiety  Prior Studies Any changes since last visit?  no  Physicians involved in your care Any changes since last visit?  no   Family History  Problem Relation Age of Onset  . Stroke Maternal Grandmother   . Colon cancer Maternal Grandmother   . Heart Problems Paternal Grandmother   . Dementia Paternal Grandmother   . Heart attack Paternal Grandfather    Social History   Socioeconomic History  . Marital status: Legally Separated    Spouse name: Not on file  . Number of children: Not on file  . Years of education: Not on file  . Highest education level: Not on file  Occupational History  . Occupation: Disabled  Social Needs  . Financial resource strain: Not on file  . Food insecurity:    Worry: Not on file    Inability: Not on file  . Transportation needs:    Medical: Not on file    Non-medical: Not on file  Tobacco Use  . Smoking status: Never Smoker  . Smokeless tobacco: Never Used  Substance and Sexual Activity  . Alcohol use: No     Alcohol/week: 0.0 oz  . Drug use: No    Comment: rx opiods  . Sexual activity: Not on file  Lifestyle  . Physical activity:    Days per week: Not on file    Minutes per session: Not on file  . Stress: Not on file  Relationships  . Social connections:    Talks on phone: Not on file    Gets together: Not on file    Attends religious service: Not on file    Active member of club or organization: Not on file    Attends meetings of clubs or organizations: Not on file    Relationship status: Not on file  Other Topics Concern  . Not on file  Social History Narrative  . Not on file   Past Surgical History:  Procedure Laterality Date  . ABDOMINOPLASTY    . CHOLECYSTECTOMY     Past Medical History:  Diagnosis Date  . DEPRESSION/ANXIETY 08/08/2009  . IBS 02/02/2010  . Polyneuropathy 12/04/2010  . Renal stones   . 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) 08/08/2009   Pulse (!) 102   Ht 5\' 11"  (1.803 m)   Wt 278 lb (126.1 kg)   SpO2 97%   BMI 38.77 kg/m   Opioid Risk Score:   Fall Risk Score:  `1  Depression screen [facility] 2/9  Depression screen [facility] 2/9 07/11/2017 06/18/2017 04/24/2017 03/22/2017 01/25/2017 11/29/2016 10/04/2016  Decreased Interest 0 1 3 3 3 3 3   Down, Depressed, Hopeless 0 1 3 3 3 3 3   PHQ - 2 Score 0 2 6 6 6 6 6   Altered sleeping - - - - - - -  Tired, decreased energy - - - - - - -  Change in appetite - - - - - - -  Feeling bad or failure about yourself  - - - - - - -  Trouble concentrating - - - - - - -  Moving slowly or fidgety/restless - - - - - - -  Suicidal thoughts - - - - - - -  PHQ-9 Score - - - - - - -  Difficult doing work/chores - - - - - - -  Some recent data might be hidden     Review of Systems  Constitutional: Negative.   HENT: Negative.   Eyes: Negative.   Respiratory: Negative.   Cardiovascular: Negative.   Gastrointestinal: Negative.   Endocrine: Negative.     Genitourinary: Negative.   Musculoskeletal: Positive for arthralgias, gait problem and myalgias.  Skin: Negative.   Allergic/Immunologic: Negative.   Neurological: Positive for tremors, weakness and numbness.  Hematological: Negative.   Psychiatric/Behavioral: Positive for dysphoric mood. The patient is nervous/anxious.   All other systems reviewed and are negative.      Objective:   Physical Exam  Constitutional: She is oriented to person, place, and time. She appears well-developed and well-nourished. No distress.  HENT:  Head: Normocephalic and atraumatic.  Neurological: She is alert and oriented to person, place, and time.  Skin: Skin is warm and dry. She is not diaphoretic.  Psychiatric: She has a normal mood and affect.  Nursing note and vitals reviewed.  Pp sensation mid calf on Right and upper calf on left reduced pp sensation bilateral hands  Proprioception absent in the great toes bilaterally present in the thumbs  Skin shows calluses at the base of the great toes bilaterally as well as at the first metatarsal head and fifth metatarsal head no open areas        Assessment & Plan:  #1.  Painful diabetic neuropathy her pain is mainly in the hands more than the feet.  Her feet are increasingly numb.  Her diabetic control has not been as strict as of late but she is starting to change her diet and has been recently started on Victoza. She will follow-up with nurse practitioner in 1 month. Continue current medications Indication for chronic opioid: See above Medication and dose: Morphabond 30 mg twice daily, MSIR 30 mg twice daily # pills per month: 60 tablets of Morphabond, 60 tablets of MSIR Last UDS date: 04/24/2017 Opioid Treatment Agreement signed (Y/N): 5/21 2019 Opioid Treatment Agreement last reviewed with patient: 5/21 2019   [place] reviewed this encounter (include red flags):  5/21 2019 is on a steady dose of Klonopin 0.5 mg 3 times daily

## 2017-08-15 ENCOUNTER — Telehealth: Payer: Self-pay | Admitting: *Deleted

## 2017-08-15 NOTE — Telephone Encounter (Signed)
Prior Authorization submitted to [facility] for morphine sulfate and it was approved.

## 2017-08-16 ENCOUNTER — Ambulatory Visit: Payer: Medicaid Other | Admitting: Physical Medicine & Rehabilitation

## 2017-08-16 ENCOUNTER — Ambulatory Visit: Payer: Medicaid Other

## 2017-08-24 ENCOUNTER — Other Ambulatory Visit: Payer: Self-pay | Admitting: Internal Medicine

## 2017-08-26 ENCOUNTER — Other Ambulatory Visit: Payer: Self-pay

## 2017-08-26 MED ORDER — LIRAGLUTIDE 18 MG/3ML ~~LOC~~ SOPN: 1.2000 mg | PEN_INJECTOR | Freq: Every day | SUBCUTANEOUS | 0 refills | Status: DC

## 2017-09-02 ENCOUNTER — Encounter: Payer: Self-pay | Admitting: Internal Medicine

## 2017-09-02 MED ORDER — LIRAGLUTIDE 18 MG/3ML ~~LOC~~ SOPN: 1.8000 mg | PEN_INJECTOR | Freq: Every day | SUBCUTANEOUS | 3 refills | Status: DC

## 2017-09-10 ENCOUNTER — Other Ambulatory Visit: Payer: Self-pay

## 2017-09-10 ENCOUNTER — Encounter: Payer: Medicaid Other | Attending: Physical Medicine & Rehabilitation | Admitting: Registered Nurse

## 2017-09-10 ENCOUNTER — Encounter: Payer: Self-pay | Admitting: Registered Nurse

## 2017-09-10 VITALS — BP 104/73 | HR 101 | Ht 71.0 in | Wt 276.8 lb

## 2017-09-10 DIAGNOSIS — F341 Dysthymic disorder: Secondary | ICD-10-CM | POA: Diagnosis not present

## 2017-09-10 DIAGNOSIS — Z79899 Other long term (current) drug therapy: Secondary | ICD-10-CM | POA: Diagnosis present

## 2017-09-10 DIAGNOSIS — M25561 Pain in right knee: Secondary | ICD-10-CM | POA: Diagnosis present

## 2017-09-10 DIAGNOSIS — Z79891 Long term (current) use of opiate analgesic: Secondary | ICD-10-CM

## 2017-09-10 DIAGNOSIS — M62838 Other muscle spasm: Secondary | ICD-10-CM | POA: Diagnosis not present

## 2017-09-10 DIAGNOSIS — G629 Polyneuropathy, unspecified: Secondary | ICD-10-CM

## 2017-09-10 DIAGNOSIS — E1140 Type 2 diabetes mellitus with diabetic neuropathy, unspecified: Secondary | ICD-10-CM | POA: Diagnosis not present

## 2017-09-10 DIAGNOSIS — M25562 Pain in left knee: Secondary | ICD-10-CM | POA: Diagnosis present

## 2017-09-10 DIAGNOSIS — G8929 Other chronic pain: Secondary | ICD-10-CM

## 2017-09-10 DIAGNOSIS — Z5181 Encounter for therapeutic drug level monitoring: Secondary | ICD-10-CM | POA: Insufficient documentation

## 2017-09-10 DIAGNOSIS — G894 Chronic pain syndrome: Secondary | ICD-10-CM | POA: Diagnosis not present

## 2017-09-10 MED ORDER — MORPHINE SULFATE 30 MG PO TABS
ORAL_TABLET | ORAL | 0 refills | Status: DC
Start: 2017-09-10 — End: 2017-10-08

## 2017-09-10 MED ORDER — MORPHINE SULFATE ER 30 MG PO T12A
30.0000 mg | EXTENDED_RELEASE_TABLET | Freq: Two times a day (BID) | ORAL | 0 refills | Status: DC
Start: 2017-09-10 — End: 2017-10-08

## 2017-09-10 MED ORDER — MORPHINE SULFATE ER 30 MG PO T12A: 30.0000 mg | EXTENDED_RELEASE_TABLET | Freq: Two times a day (BID) | ORAL | 0 refills | Status: DC

## 2017-09-10 NOTE — Progress Notes (Signed)
Subjective:    Patient ID: [name], female    DOB: 1970/03/27, 47 y.o.   MRN: [id number]  HPI: [name] is a 47 year old female who returns for follow up appointment for chronic pain and medication refill. She states her pain is located in her bilateral hands, bilateral knee pain and bilateral feet pain. She rates her pain 6. Her current exercise regime is walking.   [name] Morphine Equivalent is 120.00 MME.She is also prescribed Clonazepam. We have discussed the black box warning of using opioids and benzodiazepines. I highlighted the dangers of using these drugs together and discussed the adverse events including respiratory suppression, overdose, cognitive impairment and importance of compliance with current regimen. We will continue to monitor and adjust as indicated.   Last Oral Swab was Performed on 04/14/2017, it was consistent. Oral Swab Performed Today.   Pain Inventory Average Pain 6 Pain Right Now 6 My pain is constant, sharp, burning, dull, stabbing, tingling and aching  In the last 24 hours, has pain interfered with the following? General activity 9 Relation with others 9 Enjoyment of life 9 What TIME of day is your pain at its worst? evening Sleep (in general) Poor  Pain is worse with: walking, bending, sitting, inactivity, standing and some activites Pain improves with: rest and medication Relief from Meds: 6  Mobility walk without assistance walk with assistance use a cane how many minutes can you walk? 5 ability to climb steps?  no do you drive?  no  Function disabled: date disabled n/a  Neuro/Psych numbness tremor tingling trouble walking spasms dizziness depression anxiety  Prior Studies Any changes since last visit?  no  Physicians involved in your care Any changes since last visit?  no   Family History  Problem Relation Age of Onset  . Stroke Maternal Grandmother   . Colon cancer Maternal Grandmother     . Heart Problems Paternal Grandmother   . Dementia Paternal Grandmother   . Heart attack Paternal Grandfather    Social History   Socioeconomic History  . Marital status: Legally Separated    Spouse name: Not on file  . Number of children: Not on file  . Years of education: Not on file  . Highest education level: Not on file  Occupational History  . Occupation: Disabled  Social Needs  . Financial resource strain: Not on file  . Food insecurity:    Worry: Not on file    Inability: Not on file  . Transportation needs:    Medical: Not on file    Non-medical: Not on file  Tobacco Use  . Smoking status: Never Smoker  . Smokeless tobacco: Never Used  Substance and Sexual Activity  . Alcohol use: No    Alcohol/week: 0.0 oz  . Drug use: No    Comment: rx opiods  . Sexual activity: Not on file  Lifestyle  . Physical activity:    Days per week: Not on file    Minutes per session: Not on file  . Stress: Not on file  Relationships  . Social connections:    Talks on phone: Not on file    Gets together: Not on file    Attends religious service: Not on file    Active member of club or organization: Not on file    Attends meetings of clubs or organizations: Not on file    Relationship status: Not on file  Other Topics Concern  . Not on file  Social History Narrative  . Not on file   Past Surgical History:  Procedure Laterality Date  . ABDOMINOPLASTY    . CHOLECYSTECTOMY     Past Medical History:  Diagnosis Date  . DEPRESSION/ANXIETY 08/08/2009  . IBS 02/02/2010  . Polyneuropathy 12/04/2010  . Renal stones   . 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) 08/08/2009   BP 104/73   Pulse (!) 101   Ht 5\' 11"  (1.803 m)   Wt 276 lb 12.8 oz (125.6 kg)   SpO2 96%   BMI 38.61 kg/m   Opioid Risk Score:   Fall Risk Score:  `1  Depression screen PHQ 2/9  Depression screen [facility] 2/9 09/10/2017 07/11/2017  06/18/2017 04/24/2017 03/22/2017 01/25/2017 11/29/2016  Decreased Interest 1 0 1 3 3 3 3   Down, Depressed, Hopeless 1 0 1 3 3 3 3   PHQ - 2 Score 2 0 2 6 6 6 6   Altered sleeping - - - - - - -  Tired, decreased energy - - - - - - -  Change in appetite - - - - - - -  Feeling bad or failure about yourself  - - - - - - -  Trouble concentrating - - - - - - -  Moving slowly or fidgety/restless - - - - - - -  Suicidal thoughts - - - - - - -  PHQ-9 Score - - - - - - -  Difficult doing work/chores - - - - - - -  Some recent data might be hidden   Review of Systems  Constitutional: Negative.   HENT: Negative.   Eyes: Negative.   Respiratory: Negative.   Cardiovascular: Negative.   Gastrointestinal: Negative.   Endocrine: Negative.   Genitourinary: Negative.   Musculoskeletal: Negative.   Skin: Negative.   Allergic/Immunologic: Negative.   Neurological: Negative.   Hematological: Negative.   Psychiatric/Behavioral: Negative.   All other systems reviewed and are negative.      Objective:   Physical Exam  Constitutional: She is oriented to person, place, and time. She appears well-developed and well-nourished.  HENT:  Head: Normocephalic and atraumatic.  Neck: Normal range of motion. Neck supple.  Cardiovascular: Normal rate and regular rhythm.  Pulmonary/Chest: Effort normal and breath sounds normal.  Musculoskeletal:  Normal Muscle Bulk and Muscle Testing Reveals: Upper Extremities: Full ROM and Muscle Strength 5/5 Lower Extremities: Full ROM and Muscle Strength 5/5 Arises from table with ease Narrow Based Gait  Neurological: She is alert and oriented to person, place, and time.  Skin: Skin is warm and dry.  Psychiatric: She has a normal mood and affect.  Nursing note and vitals reviewed.         Assessment & Plan:  1.Severe peripheral polyneuropathy of undetermined etiology.: 09/10/2017 Refilled: Morphabond30 mg one tablet every 12 hours #60 and continue  MSIR 30mg  one  tablet at HS, may take a tablet during the day as needed #60. We will continue the opioid monitoring program, this consists of regular clinic visits, examinations, urine drug screen, pill counts as well as use of [place] Controlled Substance Reporting System. 2. Bilateral progressive upper extremity hand pain/ Polyneuropathy.Continue with current medication regime. Tight Glucose Control. Adhere to healthy diet regime.Continue with current medication regimen with :Amitriptyline: 09/10/17 3. Muscle Spasms: Continue current medication regimen with  Zanaflex. 09/10/2017 4. Depression/Anxiety: Continue current medication regimen with Paxil and Klonopin. 09/10/2017 5. Bilateral Chronic Knee Pain: Continue HEP as  Tolerated and Continue to Monitor. 09/10/2017.  20 minutes of face to face patient care time was spent during this visit. All questions were encouraged and answered.  F/U in 1 month

## 2017-09-12 LAB — DRUG TOX ALC METAB W/CON, ORAL FLD: Alcohol Metabolite: NEGATIVE ng/mL (ref <25)

## 2017-09-12 LAB — DRUG TOX MONITOR 1 W/CONF, ORAL FLD
AMPHETAMINES: NEGATIVE ng/mL (ref <10)
BARBITURATES: NEGATIVE ng/mL (ref <10)
Benzodiazepines: NEGATIVE ng/mL (ref <0.50)
Buprenorphine: NEGATIVE ng/mL (ref <0.10)
Cocaine: NEGATIVE ng/mL (ref <5.0)
Codeine: NEGATIVE ng/mL (ref <2.5)
Dihydrocodeine: NEGATIVE ng/mL (ref <2.5)
FENTANYL: NEGATIVE ng/mL (ref <0.10)
HEROIN METABOLITE: NEGATIVE ng/mL (ref <1.0)
Hydrocodone: NEGATIVE ng/mL (ref <2.5)
Hydromorphone: NEGATIVE ng/mL (ref <2.5)
MARIJUANA: NEGATIVE ng/mL (ref <2.5)
MDMA: NEGATIVE ng/mL (ref <10)
MEPROBAMATE: NEGATIVE ng/mL (ref <2.5)
Methadone: NEGATIVE ng/mL (ref <5.0)
Morphine: 3.8 ng/mL — ABNORMAL HIGH (ref <2.5)
NICOTINE METABOLITE: NEGATIVE ng/mL (ref <5.0)
NOROXYCODONE: NEGATIVE ng/mL (ref <2.5)
Norhydrocodone: NEGATIVE ng/mL (ref <2.5)
OPIATES: POSITIVE ng/mL — AB (ref <2.5)
OXYMORPHONE: NEGATIVE ng/mL (ref <2.5)
Oxycodone: NEGATIVE ng/mL (ref <2.5)
PHENCYCLIDINE: NEGATIVE ng/mL (ref <10)
Tapentadol: NEGATIVE ng/mL (ref <5.0)
Tramadol: NEGATIVE ng/mL (ref <5.0)
Zolpidem: NEGATIVE ng/mL (ref <5.0)

## 2017-09-16 ENCOUNTER — Telehealth: Payer: Self-pay | Admitting: *Deleted

## 2017-09-16 NOTE — Telephone Encounter (Signed)
Oral swab drug screen is positive for Morphine but clonazepam is absent. Reported last taken pm before test.

## 2017-09-17 ENCOUNTER — Other Ambulatory Visit: Payer: Self-pay | Admitting: Internal Medicine

## 2017-10-02 ENCOUNTER — Other Ambulatory Visit: Payer: Self-pay | Admitting: Obstetrics

## 2017-10-02 DIAGNOSIS — Z1231 Encounter for screening mammogram for malignant neoplasm of breast: Secondary | ICD-10-CM

## 2017-10-03 ENCOUNTER — Other Ambulatory Visit: Payer: Self-pay | Admitting: Internal Medicine

## 2017-10-08 ENCOUNTER — Encounter: Payer: Self-pay | Admitting: Registered Nurse

## 2017-10-08 ENCOUNTER — Encounter: Payer: Medicaid Other | Attending: Physical Medicine & Rehabilitation | Admitting: Registered Nurse

## 2017-10-08 VITALS — BP 122/85 | HR 99 | Resp 14 | Ht 72.0 in | Wt 276.0 lb

## 2017-10-08 DIAGNOSIS — E1140 Type 2 diabetes mellitus with diabetic neuropathy, unspecified: Secondary | ICD-10-CM | POA: Diagnosis not present

## 2017-10-08 DIAGNOSIS — Z79891 Long term (current) use of opiate analgesic: Secondary | ICD-10-CM

## 2017-10-08 DIAGNOSIS — G894 Chronic pain syndrome: Secondary | ICD-10-CM | POA: Diagnosis not present

## 2017-10-08 DIAGNOSIS — M62838 Other muscle spasm: Secondary | ICD-10-CM

## 2017-10-08 DIAGNOSIS — M25562 Pain in left knee: Secondary | ICD-10-CM | POA: Diagnosis not present

## 2017-10-08 DIAGNOSIS — Z79899 Other long term (current) drug therapy: Secondary | ICD-10-CM | POA: Diagnosis not present

## 2017-10-08 DIAGNOSIS — Z5181 Encounter for therapeutic drug level monitoring: Secondary | ICD-10-CM

## 2017-10-08 DIAGNOSIS — M25561 Pain in right knee: Secondary | ICD-10-CM | POA: Diagnosis not present

## 2017-10-08 DIAGNOSIS — F341 Dysthymic disorder: Secondary | ICD-10-CM | POA: Diagnosis not present

## 2017-10-08 DIAGNOSIS — G629 Polyneuropathy, unspecified: Secondary | ICD-10-CM

## 2017-10-08 MED ORDER — MORPHINE SULFATE 30 MG PO TABS
ORAL_TABLET | ORAL | 0 refills | Status: DC
Start: 2017-10-08 — End: 2017-10-08

## 2017-10-08 MED ORDER — MORPHINE SULFATE 30 MG PO TABS: ORAL_TABLET | ORAL | 0 refills | Status: DC

## 2017-10-08 MED ORDER — MORPHINE SULFATE ER 30 MG PO T12A
30.0000 mg | EXTENDED_RELEASE_TABLET | Freq: Two times a day (BID) | ORAL | 0 refills | Status: DC
Start: 2017-10-08 — End: 2017-11-13

## 2017-10-08 NOTE — Progress Notes (Signed)
Subjective:    Patient ID: [name], female    DOB: 03-30-1970, 47 y.o.   MRN: [id number]  HPI: [name] is a 47 year old female who returns for follow up appointment for chronic pain and medication refill. She states her pain is located in her bilateral hands and bilateral feet. She rates her pain 8. Her current exercise regime is walking. [name] reports increase intensity of neuropathic pain in her bilateral hands and bilateral feet.   [name] is 120.00 MME. She is also prescribed Clonazepam. We have discussed the black box warning of using opioids and benzodiazepines. I highlighted the dangers of using these drugs together and discussed the adverse events including respiratory suppression, overdose, cognitive impairment and importance of compliance with current regimen. We will continue to monitor and adjust as indicated.   Last Oral Swab was Performed on 09/10/2017, it was consistent.    Pain Inventory Average Pain 8 Pain Right Now 8 My pain is constant, sharp, burning, dull, stabbing, tingling and aching  In the last 24 hours, has pain interfered with the following? General activity 9 Relation with others 9 Enjoyment of life 9 What TIME of day is your pain at its worst? morning, evening  Sleep (in general) Poor  Pain is worse with: walking, bending, sitting, standing and some activites Pain improves with: rest and medication Relief from Meds: 7  Mobility walk without assistance walk with assistance use a cane how many minutes can you walk? 5 ability to climb steps?  no do you drive?  no use a wheelchair needs help with transfers transfers alone Do you have any goals in this area?  yes  Function disabled: date disabled . I need assistance with the following:  bathing, meal prep, household duties and shopping  Neuro/Psych weakness numbness tremor tingling trouble  walking spasms dizziness depression anxiety  Prior Studies Any changes since last visit?  no  Physicians involved in your care Any changes since last visit?  no   Family History  Problem Relation Age of Onset  . Stroke Maternal Grandmother   . Colon cancer Maternal Grandmother   . Heart Problems Paternal Grandmother   . Dementia Paternal Grandmother   . Heart attack Paternal Grandfather    Social History   Socioeconomic History  . Marital status: Legally Separated    Spouse name: Not on file  . Number of children: Not on file  . Years of education: Not on file  . Highest education level: Not on file  Occupational History  . Occupation: Disabled  Social Needs  . Financial resource strain: Not on file  . Food insecurity:    Worry: Not on file    Inability: Not on file  . Transportation needs:    Medical: Not on file    Non-medical: Not on file  Tobacco Use  . Smoking status: Never Smoker  . Smokeless tobacco: Never Used  Substance and Sexual Activity  . Alcohol use: No    Alcohol/week: 0.0 oz  . Drug use: No    Comment: rx opiods  . Sexual activity: Not on file  Lifestyle  . Physical activity:    Days per week: Not on file    Minutes per session: Not on file  . Stress: Not on file  Relationships  . Social connections:    Talks on phone: Not on file    Gets together: Not on file    Attends religious service: Not  on file    Active member of club or organization: Not on file    Attends meetings of clubs or organizations: Not on file    Relationship status: Not on file  Other Topics Concern  . Not on file  Social History Narrative  . Not on file   Past Surgical History:  Procedure Laterality Date  . ABDOMINOPLASTY    . CHOLECYSTECTOMY     Past Medical History:  Diagnosis Date  . DEPRESSION/ANXIETY 08/08/2009  . IBS 02/02/2010  . Polyneuropathy 12/04/2010  . Renal stones   . Type II or unspecified type diabetes mellitus with neurological  manifestations, 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) 08/08/2009   BP 122/85   Pulse 99   Resp 14   Ht 6' (1.829 m)   Wt 276 lb (125.2 kg)   SpO2 97%   BMI 37.43 kg/m   Opioid Risk Score:   Fall Risk Score:  `1  Depression screen PHQ 2/9  Depression screen [facility] 2/9 09/10/2017 07/11/2017 06/18/2017 04/24/2017 03/22/2017 01/25/2017 11/29/2016  Decreased Interest 1 0 1 3 3 3 3   Down, Depressed, Hopeless 1 0 1 3 3 3 3   PHQ - 2 Score 2 0 2 6 6 6 6   Altered sleeping - - - - - - -  Tired, decreased energy - - - - - - -  Change in appetite - - - - - - -  Feeling bad or failure about yourself  - - - - - - -  Trouble concentrating - - - - - - -  Moving slowly or fidgety/restless - - - - - - -  Suicidal thoughts - - - - - - -  PHQ-9 Score - - - - - - -  Difficult doing work/chores - - - - - - -  Some recent data might be hidden    Review of Systems  Constitutional: Negative.   HENT: Negative.   Eyes: Negative.   Respiratory: Negative.   Cardiovascular: Negative.   Gastrointestinal: Negative.   Endocrine: Negative.   Genitourinary: Negative.   Musculoskeletal: Positive for arthralgias, gait problem and myalgias.       Spasms   Skin: Negative.   Allergic/Immunologic: Negative.   Neurological: Positive for dizziness, tremors, weakness and numbness.       Tingling   Hematological: Negative.   Psychiatric/Behavioral: Positive for dysphoric mood. The patient is nervous/anxious.        Objective:   Physical Exam  Constitutional: She is oriented to person, place, and time. She appears well-developed and well-nourished.  HENT:  Head: Normocephalic and atraumatic.  Neck: Normal range of motion. Neck supple.  Cardiovascular: Normal rate and regular rhythm.  Pulmonary/Chest: Effort normal and breath sounds normal.  Musculoskeletal:  Normal Muscle Bulk and Muscle Testing Reveals: Upper Extremities: Full ROM and Muscle Strength 5/5 Lower Extremities: Full ROM and Muscle Strength  5/5 Arises from Table with ease Narrow Based gait  Neurological: She is alert and oriented to person, place, and time.  Skin: Skin is warm and dry.  Psychiatric: She has a normal mood and affect. Her behavior is normal.  Nursing note and vitals reviewed.         Assessment & Plan:  1.Severe peripheral polyneuropathy of undetermined etiology.: 10/09/2017 Refilled: Morphabond30 mg one tablet every 12 hours #60 andcontinueMSIR 30mg  one tablet at HS, may take a tablet during the day as needed #60. Second script of MSIR E-scribed to accommodate scheduled appointment.  We will continue the opioid  monitoring program, this consists of regular clinic visits, examinations, urine drug screen, pill counts as well as use of [place] Controlled Substance Reporting System. 2. Bilateral progressive upper extremity hand pain/ Polyneuropathy.Continue with current medication regime. Tight Glucose Control. Adhere to healthy diet regime.Continue with current medication regimen with:Amitriptyline: 10/09/17 3. Muscle Spasms: Continuecurrent medication regimen withZanaflex. 10/09/2017 4. Depression/Anxiety: Continuecurrent medication regimen withPaxil and Klonopin. 10/09/2017 5. Bilateral Chronic Knee Pain: No complaints Today.Continue HEP as Tolerated and Continue to Monitor. 10/09/2017.  20 minutes of face to face patient care time was spent during this visit. All questions were encouraged and answered.  F/U in 1 month

## 2017-10-16 ENCOUNTER — Telehealth: Payer: Self-pay | Admitting: *Deleted

## 2017-10-16 NOTE — Telephone Encounter (Signed)
Prior authorizations submitted to [facility] for [employer] and [employer]

## 2017-10-18 ENCOUNTER — Other Ambulatory Visit: Payer: Self-pay | Admitting: Internal Medicine

## 2017-10-30 ENCOUNTER — Ambulatory Visit: Payer: Medicaid Other

## 2017-11-05 ENCOUNTER — Other Ambulatory Visit: Payer: Self-pay | Admitting: Registered Nurse

## 2017-11-13 ENCOUNTER — Telehealth: Payer: Self-pay | Admitting: Physical Medicine & Rehabilitation

## 2017-11-13 MED ORDER — MORPHINE SULFATE ER 30 MG PO T12A
30.0000 mg | EXTENDED_RELEASE_TABLET | Freq: Two times a day (BID) | ORAL | 0 refills | Status: DC
Start: 2017-11-13 — End: 2017-11-20

## 2017-11-13 NOTE — Telephone Encounter (Signed)
Patient needs a refill on medication, we did make her an appt next Wednesday with Zella Ball.

## 2017-11-13 NOTE — Telephone Encounter (Signed)
[name] has a conflict with her scheduled appointment. PMP Aware Website was reviewed, Morphabond was filled on 10/16/2017. Morphabond e-scribed. [name] appointment has been changed to 11/20/2017, she realizes she must keep her scheduled appointment, she verbalizes understanding. April spoke with Ms. Traub.

## 2017-11-14 ENCOUNTER — Encounter: Payer: Medicaid Other | Admitting: Registered Nurse

## 2017-11-20 ENCOUNTER — Encounter: Payer: Medicaid Other | Attending: Physical Medicine & Rehabilitation | Admitting: Registered Nurse

## 2017-11-20 ENCOUNTER — Other Ambulatory Visit: Payer: Self-pay | Admitting: Physical Medicine & Rehabilitation

## 2017-11-20 ENCOUNTER — Encounter: Payer: Self-pay | Admitting: Registered Nurse

## 2017-11-20 VITALS — BP 125/83 | HR 105 | Resp 14 | Ht 72.0 in | Wt 274.0 lb

## 2017-11-20 DIAGNOSIS — M25562 Pain in left knee: Secondary | ICD-10-CM | POA: Insufficient documentation

## 2017-11-20 DIAGNOSIS — M25561 Pain in right knee: Secondary | ICD-10-CM | POA: Insufficient documentation

## 2017-11-20 DIAGNOSIS — Z79899 Other long term (current) drug therapy: Secondary | ICD-10-CM | POA: Insufficient documentation

## 2017-11-20 DIAGNOSIS — G629 Polyneuropathy, unspecified: Secondary | ICD-10-CM | POA: Diagnosis not present

## 2017-11-20 DIAGNOSIS — G8929 Other chronic pain: Secondary | ICD-10-CM | POA: Diagnosis not present

## 2017-11-20 DIAGNOSIS — G894 Chronic pain syndrome: Secondary | ICD-10-CM

## 2017-11-20 DIAGNOSIS — Z5181 Encounter for therapeutic drug level monitoring: Secondary | ICD-10-CM | POA: Diagnosis not present

## 2017-11-20 DIAGNOSIS — M79642 Pain in left hand: Secondary | ICD-10-CM | POA: Diagnosis not present

## 2017-11-20 DIAGNOSIS — Z79891 Long term (current) use of opiate analgesic: Secondary | ICD-10-CM | POA: Diagnosis not present

## 2017-11-20 DIAGNOSIS — R300 Dysuria: Secondary | ICD-10-CM | POA: Diagnosis not present

## 2017-11-20 DIAGNOSIS — M62838 Other muscle spasm: Secondary | ICD-10-CM | POA: Diagnosis not present

## 2017-11-20 DIAGNOSIS — M79641 Pain in right hand: Secondary | ICD-10-CM

## 2017-11-20 DIAGNOSIS — E1140 Type 2 diabetes mellitus with diabetic neuropathy, unspecified: Secondary | ICD-10-CM

## 2017-11-20 DIAGNOSIS — F341 Dysthymic disorder: Secondary | ICD-10-CM

## 2017-11-20 MED ORDER — MORPHINE SULFATE 30 MG PO TABS: ORAL_TABLET | ORAL | 0 refills | Status: DC

## 2017-11-20 MED ORDER — CLONAZEPAM 0.5 MG PO TABS: ORAL_TABLET | ORAL | 0 refills | Status: DC

## 2017-11-20 MED ORDER — MORPHINE SULFATE ER 30 MG PO T12A: 30.0000 mg | EXTENDED_RELEASE_TABLET | Freq: Two times a day (BID) | ORAL | 0 refills | Status: DC

## 2017-11-20 NOTE — Progress Notes (Signed)
Subjective:    Patient ID: [name], female    DOB: Sep 28, 1970, 47 y.o.   MRN: [id number]  HPI: [name] is a 47 year old female who returns for follow up appointment for chronic pain and medication refill. She states her pain is located in her bilateral hands, lower back and bilateral feet. She rates her pain 7. Her current exercise regime is walking.   [name] complaing of UTI symptoms her appointment with a new PCP is on 12/17/2017, will obtain a Urine culture today, she verbalizes understanding.   [name] Morphine Equivalent is  120.00 MME. UDS ordered today.    Pain Inventory Average Pain 7 Pain Right Now 7 My pain is constant, sharp, burning, dull, stabbing, tingling and aching  In the last 24 hours, has pain interfered with the following? General activity 10 Relation with others 10 Enjoyment of life 10 What TIME of day is your pain at its worst? morning, evening  Sleep (in general) Poor  Pain is worse with: walking, bending, sitting, standing and some activites Pain improves with: rest and medication Relief from Meds: 6  Mobility walk without assistance walk with assistance use a cane how many minutes can you walk? 5 ability to climb steps?  no do you drive?  no Do you have any goals in this area?  yes  Function disabled: date disabled . I need assistance with the following:  meal prep, household duties and shopping Do you have any goals in this area?  yes  Neuro/Psych weakness numbness tremor tingling trouble walking spasms dizziness depression anxiety  Prior Studies Any changes since last visit?  no  Physicians involved in your care Any changes since last visit?  no   Family History  Problem Relation Age of Onset  . Stroke Maternal Grandmother   . Colon cancer Maternal Grandmother   . Heart Problems Paternal Grandmother   . Dementia Paternal Grandmother   . Heart attack Paternal Grandfather     Social History   Socioeconomic History  . Marital status: Legally Separated    Spouse name: Not on file  . Number of children: Not on file  . Years of education: Not on file  . Highest education level: Not on file  Occupational History  . Occupation: Disabled  Social Needs  . Financial resource strain: Not on file  . Food insecurity:    Worry: Not on file    Inability: Not on file  . Transportation needs:    Medical: Not on file    Non-medical: Not on file  Tobacco Use  . Smoking status: Never Smoker  . Smokeless tobacco: Never Used  Substance and Sexual Activity  . Alcohol use: No    Alcohol/week: 0.0 standard drinks  . Drug use: No    Comment: rx opiods  . Sexual activity: Not on file  Lifestyle  . Physical activity:    Days per week: Not on file    Minutes per session: Not on file  . Stress: Not on file  Relationships  . Social connections:    Talks on phone: Not on file    Gets together: Not on file    Attends religious service: Not on file    Active member of club or organization: Not on file    Attends meetings of clubs or organizations: Not on file    Relationship status: Not on file  Other Topics Concern  . Not on file  Social History Narrative  .  Not on file   Past Surgical History:  Procedure Laterality Date  . ABDOMINOPLASTY    . CHOLECYSTECTOMY     Past Medical History:  Diagnosis Date  . DEPRESSION/ANXIETY 08/08/2009  . IBS 02/02/2010  . Polyneuropathy 12/04/2010  . Renal stones   . 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) 08/08/2009   BP 125/83   Pulse (!) 105   Resp 14   Ht 6' (1.829 m)   Wt 274 lb (124.3 kg)   SpO2 97%   BMI 37.16 kg/m   Opioid Risk Score:   Fall Risk Score:  `1  Depression screen PHQ 2/9  Depression screen [facility] 2/9 09/10/2017 07/11/2017 06/18/2017 04/24/2017 03/22/2017 01/25/2017 11/29/2016  Decreased Interest 1 0 1 3 3 3 3   Down, Depressed,  Hopeless 1 0 1 3 3 3 3   PHQ - 2 Score 2 0 2 6 6 6 6   Altered sleeping - - - - - - -  Tired, decreased energy - - - - - - -  Change in appetite - - - - - - -  Feeling bad or failure about yourself  - - - - - - -  Trouble concentrating - - - - - - -  Moving slowly or fidgety/restless - - - - - - -  Suicidal thoughts - - - - - - -  PHQ-9 Score - - - - - - -  Difficult doing work/chores - - - - - - -  Some recent data might be hidden    Review of Systems  Constitutional: Negative.   HENT: Negative.   Eyes: Negative.   Respiratory: Negative.   Cardiovascular: Negative.   Gastrointestinal: Negative.   Endocrine: Negative.   Genitourinary: Negative.   Musculoskeletal: Positive for gait problem.       Spasms   Skin: Negative.   Allergic/Immunologic: Negative.   Neurological: Positive for dizziness, tremors, weakness and numbness.       Tingling   Hematological: Negative.   Psychiatric/Behavioral: Positive for dysphoric mood. The patient is nervous/anxious.   All other systems reviewed and are negative.      Objective:   Physical Exam  Constitutional: She is oriented to person, place, and time. She appears well-developed and well-nourished.  HENT:  Head: Normocephalic and atraumatic.  Neck: Normal range of motion. Neck supple.  Cardiovascular: Normal rate and regular rhythm.  Pulmonary/Chest: Effort normal and breath sounds normal.  Musculoskeletal:  Normal Muscle Bulk and Muscle Testing Reveals: Upper Extremities: Full ROM and Muscle Strength 5/5 Lower Extremities: Full ROM and Muscle Strength 5/5 Arises from chair with ease Narrow Based Gait  Neurological: She is alert and oriented to person, place, and time.  Skin: Skin is warm and dry.  Psychiatric: She has a normal mood and affect. Her behavior is normal.          Assessment & Plan:  1.Severe peripheral polyneuropathy of undetermined etiology.: 11/20/2017 Refilled: Morphabond30 mg one tablet every 12 hours  #60 andcontinueMSIR 30mg  one tablet at HS, may take a tablet during the day as needed #60. Second script of MSIR E-scribed to accommodate scheduled appointment.  We will continue the opioid monitoring program, this consists of regular clinic visits, examinations, urine drug screen, pill counts as well as use of [place] Controlled Substance Reporting System. 2. Bilateral progressive upper extremity hand pain/ Polyneuropathy.Continue with current medication regime. Tight Glucose Control. Adhere to healthy diet regime.Continue with current medication regimen  with:Amitriptyline:08/128/19 3. Muscle Spasms: Continuecurrent medication regimen withZanaflex. 11/20/2017 4. Depression/Anxiety: Continuecurrent medication regimen withPaxil and Klonopin. 11/20/2017 5. Bilateral Chronic Knee Pain: No complaints Today.Continue HEP as Tolerated and Continue to Monitor. 11/20/2017. 6. Dysuria: RX: Urine Culture  20 minutes of face to face patient care time was spent during this visit. All questions were encouraged and answered.  F/U in 1 month

## 2017-11-21 LAB — URINALYSIS, ROUTINE W REFLEX MICROSCOPIC
Bilirubin, UA: NEGATIVE
GLUCOSE, UA: NEGATIVE
LEUKOCYTES UA: NEGATIVE
Nitrite, UA: NEGATIVE
PH UA: 5 (ref 5.0–7.5)
RBC, UA: NEGATIVE
Specific Gravity, UA: >=1.03 — AB (ref 1.005–1.030)
Urobilinogen, Ur: 0.2 mg/dL (ref 0.2–1.0)

## 2017-11-21 LAB — MICROSCOPIC EXAMINATION

## 2017-11-22 ENCOUNTER — Telehealth: Payer: Self-pay | Admitting: Registered Nurse

## 2017-11-22 NOTE — Telephone Encounter (Signed)
Placed a call to [name] regarding her urine culture results, she verbalizes understanding.

## 2017-11-24 ENCOUNTER — Other Ambulatory Visit: Payer: Self-pay | Admitting: Internal Medicine

## 2017-11-24 LAB — TOXASSURE SELECT,+ANTIDEPR,UR

## 2017-11-24 LAB — 6-ACETYLMORPHINE,TOXASSURE ADD
6-ACETYLMORPHINE: NEGATIVE
6-ACETYLMORPHINE: NOT DETECTED ng/mg{creat}

## 2017-11-26 ENCOUNTER — Telehealth: Payer: Self-pay | Admitting: *Deleted

## 2017-11-26 NOTE — Telephone Encounter (Signed)
Urine drug screen for this encounter is consistent for prescribed medication 

## 2017-11-28 ENCOUNTER — Other Ambulatory Visit: Payer: Self-pay

## 2017-11-28 ENCOUNTER — Encounter: Payer: Self-pay | Admitting: Student in an Organized Health Care Education/Training Program

## 2017-11-29 ENCOUNTER — Ambulatory Visit: Payer: Medicaid Other | Admitting: Podiatry

## 2017-12-02 MED ORDER — ATORVASTATIN CALCIUM 80 MG PO TABS: 80.0000 mg | ORAL_TABLET | Freq: Every day | ORAL | 3 refills | Status: DC

## 2017-12-02 MED ORDER — PAROXETINE HCL 40 MG PO TABS: 40.0000 mg | ORAL_TABLET | Freq: Every day | ORAL | 1 refills | Status: DC

## 2017-12-17 ENCOUNTER — Ambulatory Visit: Payer: Medicaid Other | Admitting: Student in an Organized Health Care Education/Training Program

## 2017-12-18 ENCOUNTER — Encounter: Payer: Medicaid Other | Admitting: Registered Nurse

## 2017-12-21 ENCOUNTER — Other Ambulatory Visit: Payer: Self-pay | Admitting: Physical Medicine & Rehabilitation

## 2017-12-21 DIAGNOSIS — F341 Dysthymic disorder: Secondary | ICD-10-CM

## 2017-12-23 ENCOUNTER — Encounter: Payer: Self-pay | Admitting: Registered Nurse

## 2017-12-23 ENCOUNTER — Encounter: Payer: Medicaid Other | Attending: Physical Medicine & Rehabilitation | Admitting: Registered Nurse

## 2017-12-23 VITALS — BP 138/83 | HR 90 | Ht 76.0 in | Wt 275.0 lb

## 2017-12-23 DIAGNOSIS — E1140 Type 2 diabetes mellitus with diabetic neuropathy, unspecified: Secondary | ICD-10-CM

## 2017-12-23 DIAGNOSIS — M62838 Other muscle spasm: Secondary | ICD-10-CM

## 2017-12-23 DIAGNOSIS — G629 Polyneuropathy, unspecified: Secondary | ICD-10-CM

## 2017-12-23 DIAGNOSIS — Z79899 Other long term (current) drug therapy: Secondary | ICD-10-CM | POA: Diagnosis not present

## 2017-12-23 DIAGNOSIS — F341 Dysthymic disorder: Secondary | ICD-10-CM

## 2017-12-23 DIAGNOSIS — M25562 Pain in left knee: Secondary | ICD-10-CM | POA: Diagnosis not present

## 2017-12-23 DIAGNOSIS — Z79891 Long term (current) use of opiate analgesic: Secondary | ICD-10-CM | POA: Diagnosis not present

## 2017-12-23 DIAGNOSIS — G894 Chronic pain syndrome: Secondary | ICD-10-CM | POA: Diagnosis not present

## 2017-12-23 DIAGNOSIS — Z5181 Encounter for therapeutic drug level monitoring: Secondary | ICD-10-CM | POA: Insufficient documentation

## 2017-12-23 DIAGNOSIS — M25561 Pain in right knee: Secondary | ICD-10-CM | POA: Insufficient documentation

## 2017-12-23 MED ORDER — MORPHINE SULFATE ER 30 MG PO T12A: 30.0000 mg | EXTENDED_RELEASE_TABLET | Freq: Two times a day (BID) | ORAL | 0 refills | Status: DC

## 2017-12-23 MED ORDER — MORPHINE SULFATE 30 MG PO TABS: ORAL_TABLET | ORAL | 0 refills | Status: DC

## 2017-12-23 NOTE — Progress Notes (Signed)
Subjective:    Patient ID: [name], female    DOB: 03/05/1971, 47 y.o.   MRN: [id number]  HPI: [name] is a 47 year old female who returns for follow up appointment for chronic pain and medication refill. She states her pain is located in her bilateral hands and bilateral feet, also reports increase intensity of neuropathic pain into her her bilateral hands, She rates her pain 8. Her current exercise regime is walking.   [name] is 120.00 MME. She is also prescribed Clonazepam. We have discussed the black box warning of using opioids and benzodiazepines. I highlighted the dangers of using these drugs together and discussed the adverse events including respiratory suppression, overdose, cognitive impairment and importance of compliance with current regimen. We will continue to monitor and adjust as indicated.   Last UDS was Performed on 11/20/2017, it was consistent.   Pain Inventory Average Pain 7 Pain Right Now 8 My pain is intermittent, constant, sharp, burning, dull, stabbing, tingling and aching  In the last 24 hours, has pain interfered with the following? General activity 10 Relation with others 10 Enjoyment of life 10 What TIME of day is your pain at its worst? morning Sleep (in general) Poor  Pain is worse with: walking, bending, standing and some activites Pain improves with: rest and medication Relief from Meds: 6  Mobility walk without assistance walk with assistance use a cane use a walker ability to climb steps?  no do you drive?  no  Function Do you have any goals in this area?  yes  Neuro/Psych weakness numbness tremor tingling trouble walking spasms dizziness depression anxiety  Prior Studies Any changes since last visit?  no  Physicians involved in your care Any changes since last visit?  no   Family History  Problem Relation Age of Onset  . Stroke Maternal Grandmother   . Colon cancer  Maternal Grandmother   . Heart Problems Paternal Grandmother   . Dementia Paternal Grandmother   . Heart attack Paternal Grandfather    Social History   Socioeconomic History  . Marital status: Legally Separated    Spouse name: Not on file  . Number of children: Not on file  . Years of education: Not on file  . Highest education level: Not on file  Occupational History  . Occupation: Disabled  Social Needs  . Financial resource strain: Not on file  . Food insecurity:    Worry: Not on file    Inability: Not on file  . Transportation needs:    Medical: Not on file    Non-medical: Not on file  Tobacco Use  . Smoking status: Never Smoker  . Smokeless tobacco: Never Used  Substance and Sexual Activity  . Alcohol use: No    Alcohol/week: 0.0 standard drinks  . Drug use: No    Comment: rx opiods  . Sexual activity: Not on file  Lifestyle  . Physical activity:    Days per week: Not on file    Minutes per session: Not on file  . Stress: Not on file  Relationships  . Social connections:    Talks on phone: Not on file    Gets together: Not on file    Attends religious service: Not on file    Active member of club or organization: Not on file    Attends meetings of clubs or organizations: Not on file    Relationship status: Not on file  Other Topics  Concern  . Not on file  Social History Narrative  . Not on file   Past Surgical History:  Procedure Laterality Date  . ABDOMINOPLASTY    . CHOLECYSTECTOMY     Past Medical History:  Diagnosis Date  . DEPRESSION/ANXIETY 08/08/2009  . IBS 02/02/2010  . Polyneuropathy 12/04/2010  . Renal stones   . 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  Opioid Risk Score:   Fall Risk Score:  `1  Depression screen PHQ 2/9  Depression screen [facility] 2/9 09/10/2017 07/11/2017 06/18/2017 04/24/2017 03/22/2017  01/25/2017 11/29/2016  Decreased Interest 1 0 1 3 3 3 3   Down, Depressed, Hopeless 1 0 1 3 3 3 3   PHQ - 2 Score 2 0 2 6 6 6 6   Altered sleeping - - - - - - -  Tired, decreased energy - - - - - - -  Change in appetite - - - - - - -  Feeling bad or failure about yourself  - - - - - - -  Trouble concentrating - - - - - - -  Moving slowly or fidgety/restless - - - - - - -  Suicidal thoughts - - - - - - -  PHQ-9 Score - - - - - - -  Difficult doing work/chores - - - - - - -  Some recent data might be hidden     Review of Systems  Constitutional: Negative.   HENT: Negative.   Eyes: Negative.   Respiratory: Negative.   Cardiovascular: Negative.   Gastrointestinal: Negative.   Endocrine: Negative.   Genitourinary: Negative.   Musculoskeletal: Positive for arthralgias, gait problem and myalgias.  Skin: Negative.   Allergic/Immunologic: Negative.   Neurological: Positive for dizziness, weakness and numbness.  Hematological: Negative.   Psychiatric/Behavioral: Positive for dysphoric mood. The patient is nervous/anxious.   All other systems reviewed and are negative.      Objective:   Physical Exam  Constitutional: She is oriented to person, place, and time. She appears well-developed and well-nourished.  HENT:  Head: Normocephalic and atraumatic.  Neck: Normal range of motion. Neck supple.  Cardiovascular: Normal rate and regular rhythm.  Pulmonary/Chest: Effort normal and breath sounds normal.  Musculoskeletal:  Normal Muscle Bulk and Muscle Testing Reveals: Upper Extremities: Full ROM and Muscle Strength 5/5 Lower Extremities: Full ROM and Muscle Strength 5/5 Arises from Table with Ease Narrow Based Gait  Neurological: She is alert and oriented to person, place, and time.  Skin: Skin is warm and dry.  Psychiatric: She has a normal mood and affect. Her behavior is normal.  Nursing note and vitals reviewed.         Assessment & Plan:  1.Severe peripheral polyneuropathy  of undetermined etiology.: 12/23/2017 Refilled: Morphabond30 mg one tablet every 12 hours #60 andcontinueMSIR 30mg  one tablet at HS, may take a tablet during the day as needed #60.  We will continue the opioid monitoring program, this consists of regular clinic visits, examinations, urine drug screen, pill counts as well as use of [place] Controlled Substance Reporting System. 2. Bilateral progressive upper extremity hand pain/ Polyneuropathy.Continue with current medication regime. Tight Glucose Control. Adhere to healthy diet regime.Continue with current medication regimen with:Amitriptyline:12/23/17 3. Muscle Spasms: Continuecurrent medication regimen withZanaflex. 12/23/2017 4. Depression/Anxiety: Continuecurrent medication regimen withPaxil and Klonopin. 12/23/2017 5. Bilateral Chronic Knee Pain: No complaints Today.Continue HEP as Tolerated and Continue  to Monitor. 12/23/2017.   20 minutes of face to face patient care time was spent during this visit. All questions were encouraged and answered.  F/U in 1 month

## 2018-01-10 ENCOUNTER — Ambulatory Visit: Payer: Medicaid Other | Admitting: Podiatry

## 2018-01-20 ENCOUNTER — Other Ambulatory Visit: Payer: Self-pay | Admitting: Physical Medicine & Rehabilitation

## 2018-01-22 ENCOUNTER — Encounter: Payer: Medicaid Other | Admitting: Registered Nurse

## 2018-01-29 ENCOUNTER — Encounter: Payer: Self-pay | Admitting: Registered Nurse

## 2018-01-29 ENCOUNTER — Encounter: Payer: Medicaid Other | Attending: Physical Medicine & Rehabilitation | Admitting: Registered Nurse

## 2018-01-29 VITALS — BP 125/87 | HR 95 | Ht 76.0 in | Wt 275.0 lb

## 2018-01-29 DIAGNOSIS — Z5181 Encounter for therapeutic drug level monitoring: Secondary | ICD-10-CM

## 2018-01-29 DIAGNOSIS — E1140 Type 2 diabetes mellitus with diabetic neuropathy, unspecified: Secondary | ICD-10-CM | POA: Diagnosis not present

## 2018-01-29 DIAGNOSIS — F341 Dysthymic disorder: Secondary | ICD-10-CM

## 2018-01-29 DIAGNOSIS — M25562 Pain in left knee: Secondary | ICD-10-CM | POA: Insufficient documentation

## 2018-01-29 DIAGNOSIS — M62838 Other muscle spasm: Secondary | ICD-10-CM

## 2018-01-29 DIAGNOSIS — G894 Chronic pain syndrome: Secondary | ICD-10-CM | POA: Diagnosis not present

## 2018-01-29 DIAGNOSIS — Z79899 Other long term (current) drug therapy: Secondary | ICD-10-CM | POA: Diagnosis not present

## 2018-01-29 DIAGNOSIS — G629 Polyneuropathy, unspecified: Secondary | ICD-10-CM | POA: Diagnosis not present

## 2018-01-29 DIAGNOSIS — M25561 Pain in right knee: Secondary | ICD-10-CM | POA: Diagnosis not present

## 2018-01-29 DIAGNOSIS — Z79891 Long term (current) use of opiate analgesic: Secondary | ICD-10-CM

## 2018-01-29 MED ORDER — MORPHINE SULFATE 30 MG PO TABS: ORAL_TABLET | ORAL | 0 refills | Status: DC

## 2018-01-29 MED ORDER — MORPHINE SULFATE ER 30 MG PO T12A: 30.0000 mg | EXTENDED_RELEASE_TABLET | Freq: Two times a day (BID) | ORAL | 0 refills | Status: DC

## 2018-01-29 NOTE — Progress Notes (Signed)
Subjective:    Patient ID: [name], female    DOB: [birth year], 47 y.o.   MRN: [id number]  HPI: [name] is a 47 year old female who returns for follow up appointment for chronic pain and medication refill. She states her pain is located in her bilateral hands, bilateral lower extremities and bilateral feet with tingling and burning pain. She rates her pain 7. Her current exercise regime is walking.   [name] Morphine Equivalent is 120.00 MME. She is also prescribed Clonazepam. We have discussed the black box warning of using opioids and benzodiazepines. I highlighted the dangers of using these drugs together and discussed the adverse events including respiratory suppression, overdose, cognitive impairment and importance of compliance with current regimen. We will continue to monitor and adjust as indicated.   Last UDS was Performed on 11/20/2017, it was consistent.    Pain Inventory Average Pain 7 Pain Right Now 7 My pain is constant, sharp, burning, dull, stabbing, tingling and aching  In the last 24 hours, has pain interfered with the following? General activity 10 Relation with others 10 Enjoyment of life 10 What TIME of day is your pain at its worst? morning Sleep (in general) Poor  Pain is worse with: walking, bending, standing and some activites Pain improves with: rest and medication Relief from Meds: 5  Mobility walk without assistance walk with assistance use a cane how many minutes can you walk? 5-10 ability to climb steps?  no do you drive?  no Do you have any goals in this area?  yes  Function disabled: date disabled . I need assistance with the following:  meal prep, household duties and shopping Do you have any goals in this area?  yes  Neuro/Psych weakness numbness tremor tingling trouble walking spasms dizziness depression anxiety  Prior Studies Any changes since last visit?  no  Physicians involved in your  care Any changes since last visit?  no   Family History  Problem Relation Age of Onset  . Stroke Maternal Grandmother   . Colon cancer Maternal Grandmother   . Heart Problems Paternal Grandmother   . Dementia Paternal Grandmother   . Heart attack Paternal Grandfather    Social History   Socioeconomic History  . Marital status: Legally Separated    Spouse name: Not on file  . Number of children: Not on file  . Years of education: Not on file  . Highest education level: Not on file  Occupational History  . Occupation: Disabled  Social Needs  . Financial resource strain: Not on file  . Food insecurity:    Worry: Not on file    Inability: Not on file  . Transportation needs:    Medical: Not on file    Non-medical: Not on file  Tobacco Use  . Smoking status: Never Smoker  . Smokeless tobacco: Never Used  Substance and Sexual Activity  . Alcohol use: No    Alcohol/week: 0.0 standard drinks  . Drug use: No    Comment: rx opiods  . Sexual activity: Not on file  Lifestyle  . Physical activity:    Days per week: Not on file    Minutes per session: Not on file  . Stress: Not on file  Relationships  . Social connections:    Talks on phone: Not on file    Gets together: Not on file    Attends religious service: Not on file    Active member of club or  organization: Not on file    Attends meetings of clubs or organizations: Not on file    Relationship status: Not on file  Other Topics Concern  . Not on file  Social History Narrative  . Not on file   Past Surgical History:  Procedure Laterality Date  . ABDOMINOPLASTY    . CHOLECYSTECTOMY     Past Medical History:  Diagnosis Date  . DEPRESSION/ANXIETY 08/08/2009  . IBS 02/02/2010  . Polyneuropathy 12/04/2010  . Renal stones   . 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) 08/08/2009   BP 125/87   Pulse 95   Ht 6\' 4"  (1.93 m) Comment: previous,  patient declined scale this visit  Wt 275 lb (124.7 kg) Comment: previous, patient declined scale this visit  SpO2 96%   BMI 33.47 kg/m   Opioid Risk Score:   Fall Risk Score:  `1  Depression screen PHQ 2/9  Depression screen [facility] 2/9 09/10/2017 07/11/2017 06/18/2017 04/24/2017 03/22/2017 01/25/2017 11/29/2016  Decreased Interest 1 0 1 3 3 3 3   Down, Depressed, Hopeless 1 0 1 3 3 3 3   PHQ - 2 Score 2 0 2 6 6 6 6   Altered sleeping - - - - - - -  Tired, decreased energy - - - - - - -  Change in appetite - - - - - - -  Feeling bad or failure about yourself  - - - - - - -  Trouble concentrating - - - - - - -  Moving slowly or fidgety/restless - - - - - - -  Suicidal thoughts - - - - - - -  PHQ-9 Score - - - - - - -  Difficult doing work/chores - - - - - - -  Some recent data might be hidden    Review of Systems  Constitutional: Negative.   HENT: Negative.   Eyes: Negative.   Respiratory: Negative.   Cardiovascular: Negative.   Gastrointestinal: Negative.   Endocrine: Negative.   Genitourinary: Negative.   Musculoskeletal: Positive for gait problem.       Spasms   Skin: Negative.   Allergic/Immunologic: Negative.   Neurological: Positive for dizziness, tremors, weakness and numbness.       Tingling  Hematological: Negative.   Psychiatric/Behavioral: Positive for dysphoric mood. The patient is nervous/anxious.   All other systems reviewed and are negative.      Objective:   Physical Exam  Constitutional: She is oriented to person, place, and time. She appears well-developed and well-nourished.  HENT:  Head: Normocephalic and atraumatic.  Neck: Normal range of motion. Neck supple.  Cardiovascular: Normal rate and regular rhythm.  Pulmonary/Chest: Effort normal and breath sounds normal.  Musculoskeletal:  Normal Muscle Bulk and Muscle Testing Reveals: Upper Extremities: Full ROM and Muscle Strength 5/5 Lower Extremities: Full ROM and Muscle Strength 5/5 Arises from Table  with ease Narrow Based Gait  Neurological: She is alert and oriented to person, place, and time.  Skin: Skin is warm and dry.  Psychiatric: She has a normal mood and affect. Her behavior is normal.  Nursing note and vitals reviewed.         Assessment & Plan:  1.Severe peripheral polyneuropathy of undetermined etiology.: 01/29/2018 Refilled: Morphabond30 mg one tablet every 12 hours #60 andcontinueMSIR 30mg  one tablet at HS, may take a tablet during the day as needed #60.   We will continue the opioid monitoring program, this consists of regular  clinic visits, examinations, urine drug screen, pill counts as well as use of [place] Controlled Substance Reporting System. 2. Bilateral progressive upper extremity hand pain/ Polyneuropathy.Continue with current medication regime. Tight Glucose Control. Adhere to healthy diet regime.Continue with current medication regimen with:Amitriptyline:01/29/18 3. Muscle Spasms: Continuecurrent medication regimen withZanaflex. 01/29/2018 4. Depression/Anxiety: Continuecurrent medication regimen withPaxil and Klonopin. 01/29/2018 5. Bilateral Chronic Knee Pain: No complaints Today.Continue HEP as Tolerated and Continue to Monitor. 01/29/2018.   20 minutes of face to face patient care time was spent during this visit. All questions were encouraged and answered.  F/U in 1 month

## 2018-01-30 ENCOUNTER — Telehealth: Payer: Self-pay | Admitting: *Deleted

## 2018-01-30 NOTE — Telephone Encounter (Signed)
Prior authorization sent [place] [facility]  for morphabond

## 2018-02-08 ENCOUNTER — Encounter: Payer: Self-pay | Admitting: Student in an Organized Health Care Education/Training Program

## 2018-02-10 ENCOUNTER — Encounter (HOSPITAL_BASED_OUTPATIENT_CLINIC_OR_DEPARTMENT_OTHER): Payer: Medicaid Other | Attending: Internal Medicine

## 2018-02-10 ENCOUNTER — Other Ambulatory Visit: Payer: Self-pay | Admitting: Internal Medicine

## 2018-02-10 MED ORDER — ENALAPRIL MALEATE 10 MG PO TABS: 10.0000 mg | ORAL_TABLET | Freq: Every day | ORAL | 0 refills | Status: DC

## 2018-02-25 ENCOUNTER — Ambulatory Visit: Payer: Medicaid Other | Admitting: Student in an Organized Health Care Education/Training Program

## 2018-02-25 NOTE — Progress Notes (Deleted)
   Subjective:    Patient ID: [name], female    DOB: 07-10-1970, 47 y.o.   MRN: [id number]   CC: follow up for lab work/diabetes- patient no showed for appointment  HPI:   Smoking status reviewed   ROS: pertinent noted in the HPI   Past Medical History:  Diagnosis Date  . DEPRESSION/ANXIETY 08/08/2009  . IBS 02/02/2010  . Polyneuropathy 12/04/2010  . Renal stones   . 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) 08/08/2009    Past Surgical History:  Procedure Laterality Date  . ABDOMINOPLASTY    . CHOLECYSTECTOMY      Past medical history, surgical, family, and social history reviewed and updated in the EMR as appropriate.  Objective:  There were no vitals taken for this visit.  Vitals and nursing note reviewed    Assessment & Plan:   H/o poorly controlled diabetes with polyneuropathy- last hgb A1c 8.6 06/2017, would recommend A1c and urine microalbumin  H/o diabetic foot ulcers- would recommend foot exam  H/o diabetic reitnopathy- would recommend f/u with ophthalmologist- chart states is overdue  H/o abnormal pap smear- would recommend continue following with OB/GYN  Due for influenza vaccine  Doristine Mango, [facility] PGY-1

## 2018-02-28 ENCOUNTER — Ambulatory Visit: Payer: Medicaid Other | Admitting: Physical Medicine & Rehabilitation

## 2018-02-28 ENCOUNTER — Encounter: Payer: Self-pay | Admitting: Physical Medicine & Rehabilitation

## 2018-02-28 ENCOUNTER — Encounter: Payer: Medicaid Other | Attending: Physical Medicine & Rehabilitation

## 2018-02-28 ENCOUNTER — Encounter: Payer: Self-pay | Admitting: Student in an Organized Health Care Education/Training Program

## 2018-02-28 VITALS — BP 119/79 | HR 104 | Resp 14 | Ht 72.0 in | Wt 275.0 lb

## 2018-02-28 DIAGNOSIS — M25562 Pain in left knee: Secondary | ICD-10-CM | POA: Insufficient documentation

## 2018-02-28 DIAGNOSIS — M25561 Pain in right knee: Secondary | ICD-10-CM | POA: Diagnosis not present

## 2018-02-28 DIAGNOSIS — R269 Unspecified abnormalities of gait and mobility: Secondary | ICD-10-CM | POA: Diagnosis not present

## 2018-02-28 DIAGNOSIS — G894 Chronic pain syndrome: Secondary | ICD-10-CM | POA: Diagnosis not present

## 2018-02-28 DIAGNOSIS — Z5181 Encounter for therapeutic drug level monitoring: Secondary | ICD-10-CM | POA: Diagnosis not present

## 2018-02-28 DIAGNOSIS — E1140 Type 2 diabetes mellitus with diabetic neuropathy, unspecified: Secondary | ICD-10-CM

## 2018-02-28 DIAGNOSIS — Z79899 Other long term (current) drug therapy: Secondary | ICD-10-CM | POA: Diagnosis not present

## 2018-02-28 MED ORDER — MORPHINE SULFATE ER 30 MG PO T12A: 30.0000 mg | EXTENDED_RELEASE_TABLET | Freq: Two times a day (BID) | ORAL | 0 refills | Status: DC

## 2018-02-28 MED ORDER — NORTRIPTYLINE HCL 25 MG PO CAPS: 25.0000 mg | ORAL_CAPSULE | Freq: Every day | ORAL | 2 refills | Status: DC

## 2018-02-28 MED ORDER — MORPHINE SULFATE 30 MG PO TABS: ORAL_TABLET | ORAL | 0 refills | Status: DC

## 2018-02-28 NOTE — Progress Notes (Signed)
Subjective:    Patient ID: [name], female    DOB: 1970/12/10, 47 y.o.   MRN: [id number]  HPI 47 year old female with history of severe diabetic neuropathy with pain.  She has been through polyneuropathy work-[facility].  She has been on multiple narcotic analgesic medications in the past through this and other clinics.  In the past she has been on  methadone, Opana, hydro-morphone, oxycodone, Nucynta (caused HA)  Atypical anticonvulsives including Topamax not helpful but  No improvement with Lyrica or gabapentin Has tried tizanidine for back pain takes it nightly  Amitriptyline helpful for sleep and depression, causes drowsiness so uses every other night  Patient has problems with her balance.  She fell in front of a [employer] on an uneven surface.  She sometimes gets dizzy with position changes but at other times the dizziness seems to come on without any movement. She notes no progressive upper or lower limb weakness.  No abnormality in muscle tone. Pain Inventory Average Pain 6 Pain Right Now 6 My pain is constant, sharp, burning, dull, stabbing, tingling and aching  In the last 24 hours, has pain interfered with the following? General activity 9 Relation with others 9 Enjoyment of life 9 What TIME of day is your pain at its worst? morning Sleep (in general) Fair  Pain is worse with: walking, bending, sitting, standing, unsure and some activites Pain improves with: rest and medication Relief from Meds: 7  Mobility walk without assistance walk with assistance use a cane how many minutes can you walk? 5-10 ability to climb steps?  no Do you have any goals in this area?  yes  Function disabled: date disabled . I need assistance with the following:  meal prep, household duties and shopping Do you have any goals in this area?  yes  Neuro/Psych weakness numbness tremor tingling trouble  walking spasms dizziness depression anxiety  Prior Studies Any changes since last visit?  no  Physicians involved in your care Any changes since last visit?  no   Family History  Problem Relation Age of Onset  . Stroke Maternal Grandmother   . Colon cancer Maternal Grandmother   . Heart Problems Paternal Grandmother   . Dementia Paternal Grandmother   . Heart attack Paternal Grandfather    Social History   Socioeconomic History  . Marital status: Legally Separated    Spouse name: Not on file  . Number of children: Not on file  . Years of education: Not on file  . Highest education level: Not on file  Occupational History  . Occupation: Disabled  Social Needs  . Financial resource strain: Not on file  . Food insecurity:    Worry: Not on file    Inability: Not on file  . Transportation needs:    Medical: Not on file    Non-medical: Not on file  Tobacco Use  . Smoking status: Never Smoker  . Smokeless tobacco: Never Used  Substance and Sexual Activity  . Alcohol use: No    Alcohol/week: 0.0 standard drinks  . Drug use: No    Comment: rx opiods  . Sexual activity: Not on file  Lifestyle  . Physical activity:    Days per week: Not on file    Minutes per session: Not on file  . Stress: Not on file  Relationships  . Social connections:    Talks on phone: Not on file    Gets together: Not on file  Attends religious service: Not on file    Active member of club or organization: Not on file    Attends meetings of clubs or organizations: Not on file    Relationship status: Not on file  Other Topics Concern  . Not on file  Social History Narrative  . Not on file   Past Surgical History:  Procedure Laterality Date  . ABDOMINOPLASTY    . CHOLECYSTECTOMY     Past Medical History:  Diagnosis Date  . DEPRESSION/ANXIETY 08/08/2009  . IBS 02/02/2010  . Polyneuropathy 12/04/2010  . Renal stones   . Type II or unspecified type diabetes mellitus with  neurological manifestations, 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) 08/08/2009   BP 119/79   Pulse (!) 104   Resp 14   Ht 6' (1.829 m)   Wt 275 lb (124.7 kg)   SpO2 96%   BMI 37.30 kg/m   Opioid Risk Score:   Fall Risk Score:  `1  Depression screen PHQ 2/9  Depression screen [facility] 2/9 09/10/2017 07/11/2017 06/18/2017 04/24/2017 03/22/2017 01/25/2017 11/29/2016  Decreased Interest 1 0 1 3 3 3 3   Down, Depressed, Hopeless 1 0 1 3 3 3 3   PHQ - 2 Score 2 0 2 6 6 6 6   Altered sleeping - - - - - - -  Tired, decreased energy - - - - - - -  Change in appetite - - - - - - -  Feeling bad or failure about yourself  - - - - - - -  Trouble concentrating - - - - - - -  Moving slowly or fidgety/restless - - - - - - -  Suicidal thoughts - - - - - - -  PHQ-9 Score - - - - - - -  Difficult doing work/chores - - - - - - -  Some recent data might be hidden    Review of Systems  Constitutional: Positive for chills and diaphoresis.  Eyes: Negative.   Respiratory: Negative.   Gastrointestinal: Positive for abdominal pain, constipation, diarrhea and nausea.  Endocrine: Negative.   Genitourinary: Negative.   Musculoskeletal: Positive for gait problem.       Spasms   Skin: Positive for rash.  Allergic/Immunologic: Negative.   Neurological: Positive for dizziness, tremors, weakness and numbness.       Tingling  Psychiatric/Behavioral: Positive for dysphoric mood. The patient is nervous/anxious.        Objective:   Physical Exam  Constitutional: She appears well-developed and well-nourished. No distress.  HENT:  Head: Normocephalic and atraumatic.  Eyes: Pupils are equal, round, and reactive to light. EOM are normal.  Neck: Normal range of motion.  Neurological: A sensory deficit is present.  Patient has absent light touch sensation in the feet bilaterally intact at bilateral medial malleoli and left lateral malleoli There is absent proprioception at the toes bilaterally but intact proprioception at  the ankle. Motor strength is 5/5 bilateral hip flexor knee extensor and 4/5 at the ankle dorsiflexor and plantar flexor toe flexor and extension is 3/5  Skin: Skin is warm and dry. Capillary refill takes less than 2 seconds. She is not diaphoretic.  Psychiatric: She has a normal mood and affect.  Nursing note and vitals reviewed.  Ambulates without assistive device no evidence of toe drag or knee instability.       Assessment & Plan:  1.  Painful diabetic neuropathy onset was many years prior to the actual diagnosis of her diabetes mellitus.  She has  had work-up for other potential causes of peripheral neuropathy at [facility]. She has not had much benefit from atypical anticonvulsants.  She also did not benefit from Moodus. She is derive the most benefit from morphine and is currently on 30 mg twice a day of short acting +30 mg twice a day of long-acting.  Would not recommend any escalation of current dosing. Other potential treatments would be higher dose buprenorphine.  Indication for chronic opioid: See above Medication and dose: MSIR 30 mill grams twice daily, MS CR 30 mill grams twice a day, we discussed that due to difficulty in obtaining Morphabond she may potentially be switched back to MS Contin but would wait until the new year for prior authorization. # pills per month: MSIR 60 tablets, MS CR 60 tablets Last UDS date: 11/20/2017 Opioid Treatment Agreement signed (Y/N): Yes Opioid Treatment Agreement last reviewed with patient:  November 2019 [place] reviewed this encounter (include red flags):  02/28/2018, on chronic Klonopin with stable dose Re-trialing Topamax may be helpful at a higher dose and certainly may help with some weight loss.  In terms of her skin breakdown as noted mainly on the dorsal aspect of her distal toes, would recommend a wide box shoe. Advised against trimming her own calluses made referral to podiatry for foot care.  2.  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  She plans to make an appointment for follow-up with Dr. Tomi Likens.  I have made referral to outpatient PT for their balance program.

## 2018-02-28 NOTE — Patient Instructions (Addendum)
Dr Tomi Likens for follow up for complaints of dizziness , tinnitius, neuropathy.    Switch from amitriptyline to nortriptyline to reduce side effects

## 2018-03-04 ENCOUNTER — Other Ambulatory Visit: Payer: Self-pay | Admitting: Student in an Organized Health Care Education/Training Program

## 2018-03-04 DIAGNOSIS — E119 Type 2 diabetes mellitus without complications: Secondary | ICD-10-CM

## 2018-03-04 MED ORDER — LIRAGLUTIDE 18 MG/3ML ~~LOC~~ SOPN: 1.8000 mg | PEN_INJECTOR | Freq: Every day | SUBCUTANEOUS | 0 refills | Status: DC

## 2018-03-15 ENCOUNTER — Other Ambulatory Visit: Payer: Self-pay | Admitting: Physical Medicine & Rehabilitation

## 2018-03-15 DIAGNOSIS — F341 Dysthymic disorder: Secondary | ICD-10-CM

## 2018-03-17 ENCOUNTER — Other Ambulatory Visit: Payer: Self-pay | Admitting: Physical Medicine & Rehabilitation

## 2018-03-25 ENCOUNTER — Telehealth: Payer: Self-pay | Admitting: Registered Nurse

## 2018-03-25 MED ORDER — MORPHINE SULFATE 30 MG PO TABS: ORAL_TABLET | ORAL | 0 refills | Status: DC

## 2018-03-25 NOTE — Telephone Encounter (Signed)
Received a My Chart Message from [name] regarding MSIR, last fill was 02/28/18. She has a scheduled appointment on 03/31/18.MSIR e-scribe. Sent [name] message regarding the above.

## 2018-03-25 NOTE — Telephone Encounter (Signed)
Message from patient

## 2018-03-31 ENCOUNTER — Encounter: Payer: Medicaid Other | Admitting: Registered Nurse

## 2018-04-02 ENCOUNTER — Other Ambulatory Visit: Payer: Self-pay

## 2018-04-02 ENCOUNTER — Telehealth: Payer: Self-pay | Admitting: Registered Nurse

## 2018-04-02 ENCOUNTER — Encounter: Payer: Medicaid Other | Admitting: Registered Nurse

## 2018-04-02 DIAGNOSIS — G894 Chronic pain syndrome: Secondary | ICD-10-CM

## 2018-04-02 MED ORDER — MORPHINE SULFATE ER 30 MG PO T12A: 1.0000 | EXTENDED_RELEASE_TABLET | Freq: Two times a day (BID) | ORAL | 0 refills | Status: DC

## 2018-04-02 NOTE — Telephone Encounter (Signed)
Received a My Chart Message from [name] e-scribe. She has a scheduled appointment with her Podiatrist and has a scheduled appointment with our office on Monday. I sent [name] a message regarding the above, in My Chart.

## 2018-04-03 ENCOUNTER — Telehealth: Payer: Self-pay | Admitting: Podiatry

## 2018-04-03 ENCOUNTER — Ambulatory Visit (INDEPENDENT_AMBULATORY_CARE_PROVIDER_SITE_OTHER): Payer: Medicaid Other

## 2018-04-03 ENCOUNTER — Ambulatory Visit: Payer: Medicaid Other | Admitting: Podiatry

## 2018-04-03 VITALS — BP 146/87 | HR 101

## 2018-04-03 DIAGNOSIS — E1142 Type 2 diabetes mellitus with diabetic polyneuropathy: Secondary | ICD-10-CM

## 2018-04-03 DIAGNOSIS — E08621 Diabetes mellitus due to underlying condition with foot ulcer: Secondary | ICD-10-CM

## 2018-04-03 DIAGNOSIS — L97519 Non-pressure chronic ulcer of other part of right foot with unspecified severity: Secondary | ICD-10-CM

## 2018-04-03 DIAGNOSIS — L97529 Non-pressure chronic ulcer of other part of left foot with unspecified severity: Secondary | ICD-10-CM | POA: Diagnosis not present

## 2018-04-03 DIAGNOSIS — R600 Localized edema: Secondary | ICD-10-CM | POA: Diagnosis not present

## 2018-04-03 DIAGNOSIS — L03119 Cellulitis of unspecified part of limb: Secondary | ICD-10-CM | POA: Diagnosis not present

## 2018-04-03 DIAGNOSIS — L02619 Cutaneous abscess of unspecified foot: Secondary | ICD-10-CM

## 2018-04-04 MED ORDER — CEPHALEXIN 500 MG PO CAPS: 500.0000 mg | ORAL_CAPSULE | Freq: Two times a day (BID) | ORAL | 0 refills | Status: DC

## 2018-04-04 NOTE — Telephone Encounter (Signed)
My antibiotics Dr. March Rummage were calling in to [facility] on [address] are not at the pharmacy.

## 2018-04-04 NOTE — Telephone Encounter (Signed)
Sent please inform 

## 2018-04-04 NOTE — Telephone Encounter (Signed)
Left message informing pt the medication had been sent to the [facility].

## 2018-04-06 ENCOUNTER — Other Ambulatory Visit: Payer: Self-pay | Admitting: Student in an Organized Health Care Education/Training Program

## 2018-04-06 DIAGNOSIS — E119 Type 2 diabetes mellitus without complications: Secondary | ICD-10-CM

## 2018-04-06 NOTE — Progress Notes (Signed)
Subjective:  Patient ID: [name], female    DOB: 05-11-1970,  MRN: [id number]  Chief Complaint  Patient presents with  . Diabetes    diabetic foot exam  . Diabetic Ulcer    multiple sites right foot    48 y.o. female presents for wound care.  States that she hit her right foot while vacuuming and has a sore on the top of the foot states that she had blood blisters on the third and fifth toes and on the outside of the right foot complains of diabetic neuropathy chronic   Review of Systems: Negative except as noted in the HPI. Denies N/V/F/Ch.  Past Medical History:  Diagnosis Date  . DEPRESSION/ANXIETY 08/08/2009  . IBS 02/02/2010  . Polyneuropathy 12/04/2010  . Renal stones   . 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) 08/08/2009    Current Outpatient Medications:  .  ACCU-CHEK AVIVA PLUS test strip, CHECK BLOOD SUGAR THREE TIMES DAILY, Disp: 100 each, Rfl: 2 .  aspirin 81 MG tablet, Take 81 mg by mouth daily., Disp: , Rfl:  .  atorvastatin (LIPITOR) 80 MG tablet, Take 1 tablet (80 mg total) by mouth daily., Disp: 90 tablet, Rfl: 3 .  cephALEXin (KEFLEX) 500 MG capsule, Take 1 capsule (500 mg total) by mouth 2 (two) times daily., Disp: 14 capsule, Rfl: 0 .  clonazePAM (KLONOPIN) 0.5 MG tablet, TAKE 3 TABLETS BY MOUTH EVERY NIGHT AT BEDTIME AS NEEDED FOR ANXIETY, Disp: 90 tablet, Rfl: 2 .  dicyclomine (BENTYL) 10 MG capsule, TAKE 1 CAPSULE(10 MG) BY MOUTH EVERY 8 HOURS AS NEEDED FOR SPASMS, Disp: 30 capsule, Rfl: 2 .  enalapril (VASOTEC) 10 MG tablet, Take 1 tablet (10 mg total) by mouth daily., Disp: 90 tablet, Rfl: 0 .  glipiZIDE (GLUCOTROL) 10 MG tablet, Take 1 tablet (10 mg total) by mouth 2 (two) times daily before a meal., Disp: 180 tablet, Rfl: 3 .  Insulin Pen Needle 32G X 4 MM MISC, Inject 1 Device into the skin daily., Disp: 90 each, Rfl: 1 .  liraglutide (VICTOZA) 18 MG/3ML SOPN, Inject 0.3  mLs (1.8 mg total) into the skin daily., Disp: 9 mL, Rfl: 0 .  metFORMIN (GLUCOPHAGE) 1000 MG tablet, Take 1 tablet (1,000 mg total) by mouth 2 (two) times daily with a meal., Disp: 180 tablet, Rfl: 3 .  morphine (MSIR) 30 MG tablet, One tablet at HS, may take a tablet during the day, Disp: 60 tablet, Rfl: 0 .  Morphine Sulfate ER (MORPHABOND ER) 30 MG T12A, Take 1 tablet by mouth every 12 (twelve) hours., Disp: 60 tablet, Rfl: 0 .  Multiple Vitamin (MULTIVITAMIN) tablet, Take 1 tablet by mouth daily., Disp: , Rfl:  .  nortriptyline (PAMELOR) 25 MG capsule, Take 1 capsule (25 mg total) by mouth at bedtime. Stop trazodone, Disp: 30 capsule, Rfl: 2 .  PARoxetine (PAXIL) 40 MG tablet, Take 1 tablet (40 mg total) by mouth daily., Disp: 90 tablet, Rfl: 1 .  tiZANidine (ZANAFLEX) 2 MG tablet, TAKE 1 TABLET BY MOUTH THREE TIMES DAILY, Disp: 270 tablet, Rfl: 1 .  VITAMIN D, CHOLECALCIFEROL, PO, Take 2,400 Units by mouth daily., Disp: , Rfl:   Social History   Tobacco Use  Smoking Status Never Smoker  Smokeless Tobacco Never Used    Allergies  Allergen Reactions  . Codeine     confusion  . Vancomycin     Red Man's syndrome  . Nucynta [Tapentadol] Other (  See Comments)    headaches   Objective:   Vitals:   04/03/18 1508  BP: (!) 146/87  Pulse: (!) 101   There is no height or weight on file to calculate BMI. Constitutional Well developed. Well nourished.  Vascular Dorsalis pedis pulses palpable bilaterally. Posterior tibial pulses palpable bilaterally. Capillary refill normal to all digits.  No cyanosis or clubbing noted. Pedal hair growth normal.  Neurologic Normal speech. Oriented to person, place, and time. Protective sensation absent  Dermatologic Wound Location: Right 3rd toe, dorsal foot Wound Base: Mixed Granular/Fibrotic Peri-wound: Reddened Exudate: None: wound tissue dry   Orthopedic: No pain to palpation either foot.   Radiographs: Taken and reviewed no osseous  erosions or dislocations. Assessment:   1. Bilateral diabetic foot ulcer associated with secondary diabetes mellitus ([place])   2. Cellulitis and abscess of foot, except toes   3. DM type 2 with diabetic peripheral neuropathy ([place])   4. Localized edema    Plan:  Patient was evaluated and treated and all questions answered.  Ulcer Right foot -Rx keflex for periwound erythema.. -Dressed with unna boot -Continue off-loading with surgical shoe. -Dress daily with abx ointment.  Return in about 1 week (around 04/10/2018) for DFU right.

## 2018-04-07 ENCOUNTER — Encounter: Payer: Medicaid Other | Admitting: Registered Nurse

## 2018-04-10 ENCOUNTER — Ambulatory Visit: Payer: Medicaid Other | Admitting: Podiatry

## 2018-04-13 ENCOUNTER — Other Ambulatory Visit: Payer: Self-pay | Admitting: Podiatry

## 2018-04-14 ENCOUNTER — Telehealth: Payer: Self-pay | Admitting: Podiatry

## 2018-04-14 MED ORDER — CEPHALEXIN 500 MG PO CAPS: 500.0000 mg | ORAL_CAPSULE | Freq: Two times a day (BID) | ORAL | 0 refills | Status: DC

## 2018-04-14 NOTE — Telephone Encounter (Signed)
Left message to call with toe status and that the antibiotic had been called in.

## 2018-04-14 NOTE — Addendum Note (Signed)
Addended by: Hardie Pulley on: 04/14/2018 12:56 PM   Modules accepted: Orders

## 2018-04-14 NOTE — Telephone Encounter (Signed)
I need a refill on my antibiotics, I hit my toe yesterday and my ulcers are not looking good.

## 2018-04-15 ENCOUNTER — Encounter: Payer: Self-pay | Admitting: Registered Nurse

## 2018-04-15 ENCOUNTER — Encounter: Payer: Medicaid Other | Attending: Physical Medicine & Rehabilitation | Admitting: Registered Nurse

## 2018-04-15 VITALS — BP 139/87 | HR 112 | Ht 72.0 in | Wt 275.0 lb

## 2018-04-15 DIAGNOSIS — M25561 Pain in right knee: Secondary | ICD-10-CM | POA: Insufficient documentation

## 2018-04-15 DIAGNOSIS — M62838 Other muscle spasm: Secondary | ICD-10-CM

## 2018-04-15 DIAGNOSIS — M792 Neuralgia and neuritis, unspecified: Secondary | ICD-10-CM | POA: Diagnosis not present

## 2018-04-15 DIAGNOSIS — Z79891 Long term (current) use of opiate analgesic: Secondary | ICD-10-CM | POA: Diagnosis not present

## 2018-04-15 DIAGNOSIS — G629 Polyneuropathy, unspecified: Secondary | ICD-10-CM

## 2018-04-15 DIAGNOSIS — M25562 Pain in left knee: Secondary | ICD-10-CM | POA: Insufficient documentation

## 2018-04-15 DIAGNOSIS — G894 Chronic pain syndrome: Secondary | ICD-10-CM | POA: Diagnosis not present

## 2018-04-15 DIAGNOSIS — F341 Dysthymic disorder: Secondary | ICD-10-CM | POA: Diagnosis not present

## 2018-04-15 DIAGNOSIS — Z5181 Encounter for therapeutic drug level monitoring: Secondary | ICD-10-CM | POA: Diagnosis not present

## 2018-04-15 DIAGNOSIS — Z79899 Other long term (current) drug therapy: Secondary | ICD-10-CM | POA: Diagnosis not present

## 2018-04-15 DIAGNOSIS — E1140 Type 2 diabetes mellitus with diabetic neuropathy, unspecified: Secondary | ICD-10-CM | POA: Diagnosis not present

## 2018-04-15 MED ORDER — MORPHINE SULFATE 30 MG PO TABS: ORAL_TABLET | ORAL | 0 refills | Status: DC

## 2018-04-15 MED ORDER — MORPHINE SULFATE ER 30 MG PO TBCR: 30.0000 mg | EXTENDED_RELEASE_TABLET | Freq: Two times a day (BID) | ORAL | 0 refills | Status: DC

## 2018-04-15 NOTE — Progress Notes (Signed)
Subjective:    Patient ID: [name], female    DOB: 04-14-1970, 48 y.o.   MRN: [id number]  HPI: [name] is a 48 y.o. female who returns for follow up appointment for chronic pain and medication refill. She states her pain is located in her bilateral hands and bilateral feet, also reports her bilateral hand neuropathic pain is greater than her bilateral feet neuropathic pain. She rates her pain 7. Her current exercise regime is walking short distances.   [name] seen Dr March Rummage ( Podiatrist) on 04/03/2018, note was reviewed. Diagnosed with Diabetic Ulcer of right foot. She has a F/U appointment with Podiatry on 04/17/2018.  [name] Morphine equivalent is 120.00 MME. She  is also prescribed Clonazepam. .We have discussed the black box warning of using opioids and benzodiazepines. I highlighted the dangers of using these drugs together and discussed the adverse events including respiratory suppression, overdose, cognitive impairment and importance of compliance with current regimen. We will continue to monitor and adjust as indicated.   Pain Inventory Average Pain 7 Pain Right Now 7 My pain is constant, sharp, burning, dull, stabbing, tingling and aching  In the last 24 hours, has pain interfered with the following? General activity 9 Relation with others 9 Enjoyment of life 10 What TIME of day is your pain at its worst? morning and night Sleep (in general) Poor  Pain is worse with: walking, bending, sitting, inactivity, standing, unsure and some activites Pain improves with: rest, medication and massage Relief from Meds: 7  Mobility walk without assistance walk with assistance use a cane how many minutes can you walk? 10 ability to climb steps?  no do you drive?  no  Function disabled: date disabled na I need assistance with the following:  bathing, meal prep, household duties and  shopping  Neuro/Psych weakness numbness tremor tingling trouble walking spasms dizziness depression anxiety  Prior Studies Any changes since last visit?  yes x-rays  Physicians involved in your care Any changes since last visit?  yes Dr. [facility]   Family History  Problem Relation Age of Onset  . Stroke Maternal Grandmother   . Colon cancer Maternal Grandmother   . Heart Problems Paternal Grandmother   . Dementia Paternal Grandmother   . Heart attack Paternal Grandfather    Social History   Socioeconomic History  . Marital status: Legally Separated    Spouse name: Not on file  . Number of children: Not on file  . Years of education: Not on file  . Highest education level: Not on file  Occupational History  . Occupation: Disabled  Social Needs  . Financial resource strain: Not on file  . Food insecurity:    Worry: Not on file    Inability: Not on file  . Transportation needs:    Medical: Not on file    Non-medical: Not on file  Tobacco Use  . Smoking status: Never Smoker  . Smokeless tobacco: Never Used  Substance and Sexual Activity  . Alcohol use: No    Alcohol/week: 0.0 standard drinks  . Drug use: No    Comment: rx opiods  . Sexual activity: Not on file  Lifestyle  . Physical activity:    Days per week: Not on file    Minutes per session: Not on file  . Stress: Not on file  Relationships  . Social connections:    Talks on phone: Not on file    Gets together: Not  on file    Attends religious service: Not on file    Active member of club or organization: Not on file    Attends meetings of clubs or organizations: Not on file    Relationship status: Not on file  Other Topics Concern  . Not on file  Social History Narrative  . Not on file   Past Surgical History:  Procedure Laterality Date  . ABDOMINOPLASTY    . CHOLECYSTECTOMY     Past Medical History:  Diagnosis Date  . DEPRESSION/ANXIETY 08/08/2009  . IBS  02/02/2010  . Polyneuropathy 12/04/2010  . Renal stones   . 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) 08/08/2009   BP 139/87   Pulse (!) 112   Ht 6' (1.829 m)   Wt 275 lb (124.7 kg)   SpO2 97%   BMI 37.30 kg/m   Opioid Risk Score:   Fall Risk Score:  `1  Depression screen PHQ 2/9  Depression screen [facility] 2/9 09/10/2017 07/11/2017 06/18/2017 04/24/2017 03/22/2017 01/25/2017 11/29/2016  Decreased Interest 1 0 1 3 3 3 3   Down, Depressed, Hopeless 1 0 1 3 3 3 3   PHQ - 2 Score 2 0 2 6 6 6 6   Altered sleeping - - - - - - -  Tired, decreased energy - - - - - - -  Change in appetite - - - - - - -  Feeling bad or failure about yourself  - - - - - - -  Trouble concentrating - - - - - - -  Moving slowly or fidgety/restless - - - - - - -  Suicidal thoughts - - - - - - -  PHQ-9 Score - - - - - - -  Difficult doing work/chores - - - - - - -  Some recent data might be hidden    Review of Systems  Constitutional: Positive for diaphoresis.       High blood sugar Low blood sugar  HENT: Negative.   Eyes: Negative.   Respiratory: Negative.   Cardiovascular: Negative.   Gastrointestinal: Positive for abdominal pain, constipation, diarrhea and nausea.  Endocrine: Negative.   Genitourinary: Negative.   Musculoskeletal: Negative.   Skin: Positive for rash.  Allergic/Immunologic: Negative.   Neurological: Negative.   Hematological: Negative.   Psychiatric/Behavioral: Negative.   All other systems reviewed and are negative.      Objective:   Physical Exam Vitals signs and nursing note reviewed.  Constitutional:      Appearance: Normal appearance.     Comments: Morbid Obesity  Neck:     Musculoskeletal: Normal range of motion and neck supple.  Cardiovascular:     Rate and Rhythm: Normal rate and regular rhythm.     Pulses: Normal pulses.     Heart sounds: Normal heart sounds.  Pulmonary:     Effort: Pulmonary  effort is normal.     Breath sounds: Normal breath sounds.  Musculoskeletal:     Comments: Normal Muscle Bulk and Muscle Testing Reveals:  Upper Extremities: Full ROM and Muscle Strength 5/5 Lower Extremities: Full ROM and Muscle Strength on the Right 4/5 and Left 5/5 Wearing Right Foot Unna Boot Arises from Table Slowly using cane for support Antalgic Gait    Skin:    General: Skin is warm and dry.  Neurological:     Mental Status: She is alert and oriented to person, place, and time.  Psychiatric:  Mood and Affect: Mood normal.        Behavior: Behavior normal.           Assessment & Plan:  1.Severe peripheral polyneuropathy of undetermined etiology.: 04/15/2018 Continue: Morphabond30 mg one tablet every 12 hours #60 andcontinueMSIR 30mg  one tablet at HS, may take a tablet during the day as needed #60.   We will continue the opioid monitoring program, this consists of regular clinic visits, examinations, urine drug screen, pill counts as well as use of [place] Controlled Substance Reporting System. 2. Bilateral progressive upper extremity hand pain/ Polyneuropathy.Continue with current medication regime. Tight Glucose Control. Adhere to healthy diet regime.Continue with current medication regimen with:Amitriptyline:04/15/2018 3. Muscle Spasms: Continuecurrent medication regimen withZanaflex. 04/15/2018 4. Depression/Anxiety: Continuecurrent medication regimen withPaxil and Klonopin. 04/15/2018 5. Bilateral Chronic Knee Pain: No complaints Today.Continue HEP as Tolerated and Continue to Monitor. 04/15/2018. 6. Right Diabetic Foot Ulcer: Podiatry Following.   20 minutes of face to face patient care time was spent during this visit. All questions were encouraged and answered.  F/U in 1 month

## 2018-04-17 ENCOUNTER — Encounter: Payer: Self-pay | Admitting: Podiatry

## 2018-04-17 ENCOUNTER — Ambulatory Visit: Payer: Medicaid Other | Admitting: Podiatry

## 2018-04-17 ENCOUNTER — Ambulatory Visit (INDEPENDENT_AMBULATORY_CARE_PROVIDER_SITE_OTHER): Payer: Medicaid Other

## 2018-04-17 VITALS — BP 133/83 | HR 101 | Temp 96.8°F | Resp 16

## 2018-04-17 DIAGNOSIS — R600 Localized edema: Secondary | ICD-10-CM

## 2018-04-17 DIAGNOSIS — E1142 Type 2 diabetes mellitus with diabetic polyneuropathy: Secondary | ICD-10-CM | POA: Diagnosis not present

## 2018-04-17 DIAGNOSIS — L03119 Cellulitis of unspecified part of limb: Secondary | ICD-10-CM | POA: Diagnosis not present

## 2018-04-17 DIAGNOSIS — L97529 Non-pressure chronic ulcer of other part of left foot with unspecified severity: Secondary | ICD-10-CM

## 2018-04-17 DIAGNOSIS — E08621 Diabetes mellitus due to underlying condition with foot ulcer: Secondary | ICD-10-CM

## 2018-04-17 DIAGNOSIS — L97511 Non-pressure chronic ulcer of other part of right foot limited to breakdown of skin: Secondary | ICD-10-CM | POA: Diagnosis not present

## 2018-04-17 DIAGNOSIS — L02619 Cutaneous abscess of unspecified foot: Secondary | ICD-10-CM | POA: Diagnosis not present

## 2018-04-17 DIAGNOSIS — L97519 Non-pressure chronic ulcer of other part of right foot with unspecified severity: Secondary | ICD-10-CM

## 2018-04-17 MED ORDER — CIPROFLOXACIN HCL 250 MG PO TABS: 250.0000 mg | ORAL_TABLET | Freq: Two times a day (BID) | ORAL | 0 refills | Status: DC

## 2018-04-17 MED ORDER — CLINDAMYCIN HCL 300 MG PO CAPS: 300.0000 mg | ORAL_CAPSULE | Freq: Two times a day (BID) | ORAL | 0 refills | Status: DC

## 2018-04-20 NOTE — Progress Notes (Signed)
Subjective:  Patient ID: [name], female    DOB: 1970/06/30,  MRN: [id number]  Chief Complaint  Patient presents with  . Foot Ulcer    F/U R foot ulcer PT. states," still red, swollen and draining (yellow-greenish fluid). Also, they feel sore (3/10 pain.)' -pt denies N/V/F/CH Tx: keflex, post op shoe, and abx ointment  . Diabetes    FBS: 101 A1C: 8.6    48 y.o. female presents for wound care.  States that the toes look more red than they did previously.   Review of Systems: Negative except as noted in the HPI. Denies N/V/F/Ch.  Past Medical History:  Diagnosis Date  . DEPRESSION/ANXIETY 08/08/2009  . IBS 02/02/2010  . Polyneuropathy 12/04/2010  . Renal stones   . 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) 08/08/2009    Current Outpatient Medications:  .  ACCU-CHEK AVIVA PLUS test strip, CHECK BLOOD SUGAR THREE TIMES DAILY, Disp: 100 each, Rfl: 2 .  aspirin 81 MG tablet, Take 81 mg by mouth daily., Disp: , Rfl:  .  atorvastatin (LIPITOR) 80 MG tablet, Take 1 tablet (80 mg total) by mouth daily., Disp: 90 tablet, Rfl: 3 .  cephALEXin (KEFLEX) 500 MG capsule, TAKE 1 CAPSULE(500 MG) BY MOUTH TWICE DAILY, Disp: 14 capsule, Rfl: 0 .  cephALEXin (KEFLEX) 500 MG capsule, Take 1 capsule (500 mg total) by mouth 2 (two) times daily., Disp: 14 capsule, Rfl: 0 .  clonazePAM (KLONOPIN) 0.5 MG tablet, TAKE 3 TABLETS BY MOUTH EVERY NIGHT AT BEDTIME AS NEEDED FOR ANXIETY, Disp: 90 tablet, Rfl: 2 .  dicyclomine (BENTYL) 10 MG capsule, TAKE 1 CAPSULE(10 MG) BY MOUTH EVERY 8 HOURS AS NEEDED FOR SPASMS, Disp: 30 capsule, Rfl: 2 .  enalapril (VASOTEC) 10 MG tablet, Take 1 tablet (10 mg total) by mouth daily., Disp: 90 tablet, Rfl: 0 .  glipiZIDE (GLUCOTROL) 10 MG tablet, Take 1 tablet (10 mg total) by mouth 2 (two) times daily before a meal., Disp: 180 tablet, Rfl: 3 .  Insulin Pen Needle 32G X 4 MM MISC, Inject 1 Device  into the skin daily., Disp: 90 each, Rfl: 1 .  metFORMIN (GLUCOPHAGE) 1000 MG tablet, Take 1 tablet (1,000 mg total) by mouth 2 (two) times daily with a meal., Disp: 180 tablet, Rfl: 3 .  morphine (MS CONTIN) 30 MG 12 hr tablet, Take 1 tablet (30 mg total) by mouth every 12 (twelve) hours., Disp: 60 tablet, Rfl: 0 .  morphine (MSIR) 30 MG tablet, One tablet at HS, may take a tablet during the day, Disp: 60 tablet, Rfl: 0 .  Multiple Vitamin (MULTIVITAMIN) tablet, Take 1 tablet by mouth daily., Disp: , Rfl:  .  nortriptyline (PAMELOR) 25 MG capsule, Take 1 capsule (25 mg total) by mouth at bedtime. Stop trazodone, Disp: 30 capsule, Rfl: 2 .  PARoxetine (PAXIL) 40 MG tablet, Take 1 tablet (40 mg total) by mouth daily., Disp: 90 tablet, Rfl: 1 .  tiZANidine (ZANAFLEX) 2 MG tablet, TAKE 1 TABLET BY MOUTH THREE TIMES DAILY, Disp: 270 tablet, Rfl: 1 .  VICTOZA 18 MG/3ML SOPN, ADMINISTER 1.8 MG UNDER THE SKIN DAILY, Disp: 9 mL, Rfl: 2 .  VITAMIN D, CHOLECALCIFEROL, PO, Take 2,400 Units by mouth daily., Disp: , Rfl:  .  ciprofloxacin (CIPRO) 250 MG tablet, Take 1 tablet (250 mg total) by mouth 2 (two) times daily., Disp: 14 tablet, Rfl: 0 .  clindamycin (CLEOCIN) 300 MG capsule, Take 1 capsule (  300 mg total) by mouth 2 (two) times daily., Disp: 14 capsule, Rfl: 0  Social History   Tobacco Use  Smoking Status Never Smoker  Smokeless Tobacco Never Used    Allergies  Allergen Reactions  . Codeine     confusion  . Vancomycin     Red Man's syndrome  . Nucynta [Tapentadol] Other (See Comments)    headaches   Objective:   Vitals:   04/17/18 1558  BP: 133/83  Pulse: (!) 101  Resp: 16  Temp: (!) 96.8 F (36 C)   There is no height or weight on file to calculate BMI. Constitutional Well developed. Well nourished.  Vascular Dorsalis pedis pulses palpable bilaterally. Posterior tibial pulses palpable bilaterally. Capillary refill normal to all digits.  No cyanosis or clubbing noted. Pedal  hair growth normal.  Neurologic Normal speech. Oriented to person, place, and time. Protective sensation absent  Dermatologic Wound Location: Right 3rd toe, dorsal foot Wound Base: Mixed Granular/Fibrotic Peri-wound: Reddened Exudate: None: wound tissue dry   Orthopedic: No pain to palpation either foot.   Radiographs: Taken and reviewed no osseous erosions or dislocations. Assessment:   1. Cellulitis and abscess of foot, except toes   2. 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)   3. DM type 2 with diabetic peripheral neuropathy ([place])   4. Localized edema    Plan:  Patient was evaluated and treated and all questions answered.  Ulcer Right foot -Worsening cellulitis of the third toe noted.  Will switch to Clinda and Cipro -Advised that should the cellulitis worsen she should proceed to the emergency room -Continue off-loading with surgical shoe. -Dress daily with abx ointment.  No follow-ups on file.

## 2018-04-23 ENCOUNTER — Ambulatory Visit: Payer: Medicaid Other

## 2018-04-24 ENCOUNTER — Ambulatory Visit: Payer: Medicaid Other | Admitting: Podiatry

## 2018-04-24 DIAGNOSIS — E1142 Type 2 diabetes mellitus with diabetic polyneuropathy: Secondary | ICD-10-CM | POA: Diagnosis not present

## 2018-04-24 DIAGNOSIS — L03119 Cellulitis of unspecified part of limb: Principal | ICD-10-CM

## 2018-04-24 DIAGNOSIS — L02619 Cutaneous abscess of unspecified foot: Secondary | ICD-10-CM

## 2018-04-24 DIAGNOSIS — L02611 Cutaneous abscess of right foot: Secondary | ICD-10-CM

## 2018-04-24 DIAGNOSIS — R0989 Other specified symptoms and signs involving the circulatory and respiratory systems: Secondary | ICD-10-CM

## 2018-04-24 DIAGNOSIS — E1151 Type 2 diabetes mellitus with diabetic peripheral angiopathy without gangrene: Secondary | ICD-10-CM | POA: Diagnosis not present

## 2018-04-24 NOTE — Progress Notes (Signed)
Subjective:  Patient ID: [name], female    DOB: [birth year],  MRN: [id number]  Chief Complaint  Patient presents with  . Diabetic Ulcer    right foot - finished antibiotics last night - doesn't see much difference    48 y.o. female presents for wound care.  States that she took all her antibiotics since then made her little nauseous but otherwise denies problems with them.  Review of Systems: Negative except as noted in the HPI. Denies N/V/F/Ch.  Past Medical History:  Diagnosis Date  . DEPRESSION/ANXIETY 08/08/2009  . IBS 02/02/2010  . Polyneuropathy 12/04/2010  . Renal stones   . 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) 08/08/2009    Current Outpatient Medications:  .  ACCU-CHEK AVIVA PLUS test strip, CHECK BLOOD SUGAR THREE TIMES DAILY, Disp: 100 each, Rfl: 2 .  aspirin 81 MG tablet, Take 81 mg by mouth daily., Disp: , Rfl:  .  atorvastatin (LIPITOR) 80 MG tablet, Take 1 tablet (80 mg total) by mouth daily., Disp: 90 tablet, Rfl: 3 .  cephALEXin (KEFLEX) 500 MG capsule, TAKE 1 CAPSULE(500 MG) BY MOUTH TWICE DAILY, Disp: 14 capsule, Rfl: 0 .  cephALEXin (KEFLEX) 500 MG capsule, Take 1 capsule (500 mg total) by mouth 2 (two) times daily., Disp: 14 capsule, Rfl: 0 .  ciprofloxacin (CIPRO) 250 MG tablet, Take 1 tablet (250 mg total) by mouth 2 (two) times daily., Disp: 14 tablet, Rfl: 0 .  clindamycin (CLEOCIN) 300 MG capsule, Take 1 capsule (300 mg total) by mouth 2 (two) times daily., Disp: 14 capsule, Rfl: 0 .  clonazePAM (KLONOPIN) 0.5 MG tablet, TAKE 3 TABLETS BY MOUTH EVERY NIGHT AT BEDTIME AS NEEDED FOR ANXIETY, Disp: 90 tablet, Rfl: 2 .  dicyclomine (BENTYL) 10 MG capsule, TAKE 1 CAPSULE(10 MG) BY MOUTH EVERY 8 HOURS AS NEEDED FOR SPASMS, Disp: 30 capsule, Rfl: 2 .  enalapril (VASOTEC) 10 MG tablet, Take 1 tablet (10 mg total) by mouth daily., Disp: 90 tablet, Rfl: 0 .  glipiZIDE (GLUCOTROL) 10  MG tablet, Take 1 tablet (10 mg total) by mouth 2 (two) times daily before a meal., Disp: 180 tablet, Rfl: 3 .  Insulin Pen Needle 32G X 4 MM MISC, Inject 1 Device into the skin daily., Disp: 90 each, Rfl: 1 .  metFORMIN (GLUCOPHAGE) 1000 MG tablet, Take 1 tablet (1,000 mg total) by mouth 2 (two) times daily with a meal., Disp: 180 tablet, Rfl: 3 .  morphine (MS CONTIN) 30 MG 12 hr tablet, Take 1 tablet (30 mg total) by mouth every 12 (twelve) hours., Disp: 60 tablet, Rfl: 0 .  morphine (MSIR) 30 MG tablet, One tablet at HS, may take a tablet during the day, Disp: 60 tablet, Rfl: 0 .  Multiple Vitamin (MULTIVITAMIN) tablet, Take 1 tablet by mouth daily., Disp: , Rfl:  .  nortriptyline (PAMELOR) 25 MG capsule, Take 1 capsule (25 mg total) by mouth at bedtime. Stop trazodone, Disp: 30 capsule, Rfl: 2 .  PARoxetine (PAXIL) 40 MG tablet, Take 1 tablet (40 mg total) by mouth daily., Disp: 90 tablet, Rfl: 1 .  tiZANidine (ZANAFLEX) 2 MG tablet, TAKE 1 TABLET BY MOUTH THREE TIMES DAILY, Disp: 270 tablet, Rfl: 1 .  VICTOZA 18 MG/3ML SOPN, ADMINISTER 1.8 MG UNDER THE SKIN DAILY, Disp: 9 mL, Rfl: 2 .  VITAMIN D, CHOLECALCIFEROL, PO, Take 2,400 Units by mouth daily., Disp: , Rfl:   Social History   Tobacco Use  Smoking Status Never Smoker  Smokeless Tobacco Never Used    Allergies  Allergen Reactions  . Codeine     confusion  . Vancomycin     Red Man's syndrome  . Nucynta [Tapentadol] Other (See Comments)    headaches   Objective:   There were no vitals filed for this visit. There is no height or weight on file to calculate BMI. Constitutional Well developed. Well nourished.  Vascular Dorsalis pedis pulses palpable bilaterally. Posterior tibial pulses non-palpable bilaterally. Capillary refill normal to all digits.  Pallor noted today.  Neurologic Normal speech. Oriented to person, place, and time. Protective sensation absent  Dermatologic Wound Location: Right 3rd toe, dorsal  foot Wound Base: Mixed Granular/Fibrotic Peri-wound: Reddened Exudate: None: wound tissue dry   Orthopedic: No pain to palpation either foot.   Radiographs: None today Assessment:   1. Cellulitis and abscess of foot, except toes   2. DM type 2 with diabetic peripheral neuropathy ([place])   3. Diabetes mellitus type 2 with peripheral artery disease ([place])   4. Diminished pulse    Plan:  Patient was evaluated and treated and all questions answered.  Ulcer Right foot -Erythema down more pallor noted today.  Will refer for noninvasive vascular studies -Antibiotic ointment and dressing applied to all open ulcerations. -Continue offloading ulcerations in surgical shoe and dressed daily -Follow-up next week for wound check -No need for refill of antibiotics at this time no warmth erythema concerning for acute infection  Return in about 1 week (around 05/01/2018) for Wound Care, Right.

## 2018-04-25 ENCOUNTER — Telehealth: Payer: Self-pay | Admitting: *Deleted

## 2018-04-25 DIAGNOSIS — R0989 Other specified symptoms and signs involving the circulatory and respiratory systems: Secondary | ICD-10-CM

## 2018-04-25 DIAGNOSIS — R600 Localized edema: Secondary | ICD-10-CM

## 2018-04-25 DIAGNOSIS — L03119 Cellulitis of unspecified part of limb: Principal | ICD-10-CM

## 2018-04-25 DIAGNOSIS — L02619 Cutaneous abscess of unspecified foot: Secondary | ICD-10-CM

## 2018-04-25 DIAGNOSIS — E1142 Type 2 diabetes mellitus with diabetic polyneuropathy: Secondary | ICD-10-CM

## 2018-04-25 NOTE — Telephone Encounter (Signed)
[place] [id number] ABI WITH TBI, AUTHORIZATION# [id number], EFFECTIVE: 04/25/2018, END: 05/25/2018.

## 2018-04-25 NOTE — Telephone Encounter (Signed)
Dr. March Rummage ordered ABI with TBI and arterial dopplers, requesting for next week. Orders faxed to [facility] and referral to [place] fax.

## 2018-04-30 ENCOUNTER — Encounter: Payer: Medicaid Other | Admitting: Student in an Organized Health Care Education/Training Program

## 2018-05-02 ENCOUNTER — Ambulatory Visit (HOSPITAL_COMMUNITY): Admission: RE | Admit: 2018-05-02 | Payer: Medicaid Other | Source: Ambulatory Visit

## 2018-05-02 ENCOUNTER — Encounter: Payer: Medicaid Other | Admitting: Podiatry

## 2018-05-08 ENCOUNTER — Inpatient Hospital Stay
Admission: EM | Admit: 2018-05-08 | Discharge: 2018-05-09 | DRG: 638 | Disposition: A | Discharge status: Home / Self Care | Payer: Medicaid Other | Attending: Internal Medicine | Admitting: Internal Medicine

## 2018-05-08 ENCOUNTER — Other Ambulatory Visit: Payer: Self-pay

## 2018-05-08 ENCOUNTER — Encounter: Payer: Self-pay | Admitting: Emergency Medicine

## 2018-05-08 DIAGNOSIS — I96 Gangrene, not elsewhere classified: Secondary | ICD-10-CM | POA: Diagnosis present

## 2018-05-08 DIAGNOSIS — E1149 Type 2 diabetes mellitus with other diabetic neurological complication: Secondary | ICD-10-CM | POA: Diagnosis present

## 2018-05-08 DIAGNOSIS — F419 Anxiety disorder, unspecified: Secondary | ICD-10-CM | POA: Diagnosis present

## 2018-05-08 DIAGNOSIS — L97519 Non-pressure chronic ulcer of other part of right foot with unspecified severity: Secondary | ICD-10-CM | POA: Diagnosis present

## 2018-05-08 DIAGNOSIS — E1151 Type 2 diabetes mellitus with diabetic peripheral angiopathy without gangrene: Secondary | ICD-10-CM | POA: Diagnosis present

## 2018-05-08 DIAGNOSIS — E785 Hyperlipidemia, unspecified: Secondary | ICD-10-CM | POA: Diagnosis present

## 2018-05-08 DIAGNOSIS — L03031 Cellulitis of right toe: Secondary | ICD-10-CM | POA: Diagnosis not present

## 2018-05-08 DIAGNOSIS — Z8249 Family history of ischemic heart disease and other diseases of the circulatory system: Secondary | ICD-10-CM

## 2018-05-08 DIAGNOSIS — Z888 Allergy status to other drugs, medicaments and biological substances status: Secondary | ICD-10-CM

## 2018-05-08 DIAGNOSIS — L02611 Cutaneous abscess of right foot: Secondary | ICD-10-CM | POA: Diagnosis not present

## 2018-05-08 DIAGNOSIS — Z800 Family history of malignant neoplasm of digestive organs: Secondary | ICD-10-CM

## 2018-05-08 DIAGNOSIS — I739 Peripheral vascular disease, unspecified: Secondary | ICD-10-CM | POA: Diagnosis not present

## 2018-05-08 DIAGNOSIS — G8929 Other chronic pain: Secondary | ICD-10-CM | POA: Diagnosis present

## 2018-05-08 DIAGNOSIS — E1152 Type 2 diabetes mellitus with diabetic peripheral angiopathy with gangrene: Secondary | ICD-10-CM

## 2018-05-08 DIAGNOSIS — Z823 Family history of stroke: Secondary | ICD-10-CM

## 2018-05-08 DIAGNOSIS — E7800 Pure hypercholesterolemia, unspecified: Secondary | ICD-10-CM | POA: Diagnosis present

## 2018-05-08 DIAGNOSIS — E118 Type 2 diabetes mellitus with unspecified complications: Secondary | ICD-10-CM

## 2018-05-08 DIAGNOSIS — G629 Polyneuropathy, unspecified: Secondary | ICD-10-CM

## 2018-05-08 DIAGNOSIS — E11621 Type 2 diabetes mellitus with foot ulcer: Principal | ICD-10-CM | POA: Diagnosis present

## 2018-05-08 DIAGNOSIS — I10 Essential (primary) hypertension: Secondary | ICD-10-CM | POA: Diagnosis present

## 2018-05-08 DIAGNOSIS — E11628 Type 2 diabetes mellitus with other skin complications: Secondary | ICD-10-CM | POA: Diagnosis present

## 2018-05-08 DIAGNOSIS — Z7982 Long term (current) use of aspirin: Secondary | ICD-10-CM

## 2018-05-08 DIAGNOSIS — Z794 Long term (current) use of insulin: Secondary | ICD-10-CM

## 2018-05-08 DIAGNOSIS — Z79899 Other long term (current) drug therapy: Secondary | ICD-10-CM

## 2018-05-08 DIAGNOSIS — Z881 Allergy status to other antibiotic agents status: Secondary | ICD-10-CM

## 2018-05-08 HISTORY — DX: Polyneuropathy, unspecified: G62.9

## 2018-05-08 HISTORY — DX: Type 2 diabetes mellitus without complications: E11.9

## 2018-05-08 NOTE — ED Triage Notes (Signed)
Pt presents to ED with worsening foot infection/wound to the inner aspect of her right foot. Pt has been undergoing treatment with podiatrist for the past month for a diabetic foot wound that is not healing well. Has taken 3 different antibiotics with no improvement. Pt states yesterday and today the wound has become more purple and red in color with a blackened center. Denies pain but reports that she does have several neuropathy. Has been referred to "cardiovascular imaging" but has not made appt. Small amount of drainage present.

## 2018-05-09 ENCOUNTER — Telehealth: Payer: Self-pay | Admitting: Registered Nurse

## 2018-05-09 ENCOUNTER — Other Ambulatory Visit: Payer: Self-pay | Admitting: Student in an Organized Health Care Education/Training Program

## 2018-05-09 ENCOUNTER — Emergency Department: Payer: Medicaid Other

## 2018-05-09 ENCOUNTER — Encounter: Payer: Self-pay | Admitting: *Deleted

## 2018-05-09 ENCOUNTER — Encounter
Admission: EM | Disposition: A | Discharge status: Home / Self Care | Payer: Self-pay | Source: Home / Self Care | Attending: Internal Medicine

## 2018-05-09 ENCOUNTER — Telehealth: Payer: Self-pay | Admitting: *Deleted

## 2018-05-09 ENCOUNTER — Other Ambulatory Visit: Payer: Self-pay

## 2018-05-09 DIAGNOSIS — L03115 Cellulitis of right lower limb: Secondary | ICD-10-CM | POA: Diagnosis not present

## 2018-05-09 DIAGNOSIS — L02611 Cutaneous abscess of right foot: Secondary | ICD-10-CM | POA: Diagnosis not present

## 2018-05-09 DIAGNOSIS — E1149 Type 2 diabetes mellitus with other diabetic neurological complication: Secondary | ICD-10-CM | POA: Diagnosis present

## 2018-05-09 DIAGNOSIS — I739 Peripheral vascular disease, unspecified: Secondary | ICD-10-CM | POA: Diagnosis not present

## 2018-05-09 DIAGNOSIS — I96 Gangrene, not elsewhere classified: Secondary | ICD-10-CM | POA: Diagnosis present

## 2018-05-09 DIAGNOSIS — E1151 Type 2 diabetes mellitus with diabetic peripheral angiopathy without gangrene: Secondary | ICD-10-CM

## 2018-05-09 DIAGNOSIS — Z7982 Long term (current) use of aspirin: Secondary | ICD-10-CM | POA: Diagnosis not present

## 2018-05-09 DIAGNOSIS — L97519 Non-pressure chronic ulcer of other part of right foot with unspecified severity: Secondary | ICD-10-CM

## 2018-05-09 DIAGNOSIS — Z888 Allergy status to other drugs, medicaments and biological substances status: Secondary | ICD-10-CM | POA: Diagnosis not present

## 2018-05-09 DIAGNOSIS — Z823 Family history of stroke: Secondary | ICD-10-CM | POA: Diagnosis not present

## 2018-05-09 DIAGNOSIS — E11628 Type 2 diabetes mellitus with other skin complications: Secondary | ICD-10-CM | POA: Diagnosis present

## 2018-05-09 DIAGNOSIS — E11621 Type 2 diabetes mellitus with foot ulcer: Secondary | ICD-10-CM | POA: Diagnosis present

## 2018-05-09 DIAGNOSIS — E785 Hyperlipidemia, unspecified: Secondary | ICD-10-CM

## 2018-05-09 DIAGNOSIS — Z794 Long term (current) use of insulin: Secondary | ICD-10-CM | POA: Diagnosis not present

## 2018-05-09 DIAGNOSIS — I10 Essential (primary) hypertension: Secondary | ICD-10-CM | POA: Diagnosis not present

## 2018-05-09 DIAGNOSIS — Z800 Family history of malignant neoplasm of digestive organs: Secondary | ICD-10-CM | POA: Diagnosis not present

## 2018-05-09 DIAGNOSIS — Z881 Allergy status to other antibiotic agents status: Secondary | ICD-10-CM | POA: Diagnosis not present

## 2018-05-09 DIAGNOSIS — Z79899 Other long term (current) drug therapy: Secondary | ICD-10-CM | POA: Diagnosis not present

## 2018-05-09 DIAGNOSIS — E7800 Pure hypercholesterolemia, unspecified: Secondary | ICD-10-CM | POA: Diagnosis present

## 2018-05-09 DIAGNOSIS — L03031 Cellulitis of right toe: Secondary | ICD-10-CM | POA: Diagnosis not present

## 2018-05-09 DIAGNOSIS — G8929 Other chronic pain: Secondary | ICD-10-CM | POA: Diagnosis present

## 2018-05-09 DIAGNOSIS — F419 Anxiety disorder, unspecified: Secondary | ICD-10-CM | POA: Diagnosis present

## 2018-05-09 DIAGNOSIS — Z8249 Family history of ischemic heart disease and other diseases of the circulatory system: Secondary | ICD-10-CM | POA: Diagnosis not present

## 2018-05-09 LAB — PROTIME-INR
INR: 0.88
PROTHROMBIN TIME: 11.9 s (ref 11.4–15.2)

## 2018-05-09 LAB — BASIC METABOLIC PANEL
Anion gap: 12 (ref 5–15)
BUN: 6 mg/dL (ref 6–20)
CO2: 25 mmol/L (ref 22–32)
CREATININE: 0.6 mg/dL (ref 0.44–1.00)
Calcium: 9.9 mg/dL (ref 8.9–10.3)
Chloride: 100 mmol/L (ref 98–111)
GFR calc Af Amer: >60 mL/min (ref >60)
GFR calc non Af Amer: >60 mL/min (ref >60)
Glucose, Bld: 186 mg/dL — ABNORMAL HIGH (ref 70–99)
Potassium: 4 mmol/L (ref 3.5–5.1)
Sodium: 137 mmol/L (ref 135–145)

## 2018-05-09 LAB — COMPREHENSIVE METABOLIC PANEL
ALT: 32 U/L (ref 0–44)
AST: 35 U/L (ref 15–41)
Albumin: 4.3 g/dL (ref 3.5–5.0)
Alkaline Phosphatase: 45 U/L (ref 38–126)
Anion gap: 13 (ref 5–15)
BUN: 7 mg/dL (ref 6–20)
CO2: 27 mmol/L (ref 22–32)
Calcium: 10.5 mg/dL — ABNORMAL HIGH (ref 8.9–10.3)
Chloride: 99 mmol/L (ref 98–111)
Creatinine, Ser: 0.56 mg/dL (ref 0.44–1.00)
GFR calc Af Amer: >60 mL/min (ref >60)
GFR calc non Af Amer: >60 mL/min (ref >60)
Glucose, Bld: 162 mg/dL — ABNORMAL HIGH (ref 70–99)
Potassium: 3.9 mmol/L (ref 3.5–5.1)
SODIUM: 139 mmol/L (ref 135–145)
Total Bilirubin: 0.5 mg/dL (ref 0.3–1.2)
Total Protein: 8 g/dL (ref 6.5–8.1)

## 2018-05-09 LAB — CBC
HCT: 46.9 % — ABNORMAL HIGH (ref 36.0–46.0)
HEMATOCRIT: 41.7 % (ref 36.0–46.0)
Hemoglobin: 15 g/dL (ref 12.0–15.0)
Hemoglobin: 13.6 g/dL (ref 12.0–15.0)
MCH: 27.3 pg (ref 26.0–34.0)
MCH: 27.5 pg (ref 26.0–34.0)
MCHC: 32 g/dL (ref 30.0–36.0)
MCHC: 32.6 g/dL (ref 30.0–36.0)
MCV: 84.4 fL (ref 80.0–100.0)
MCV: 85.4 fL (ref 80.0–100.0)
Platelets: 317 10*3/uL (ref 150–400)
Platelets: 309 10*3/uL (ref 150–400)
RBC: 5.49 MIL/uL — ABNORMAL HIGH (ref 3.87–5.11)
RBC: 4.94 MIL/uL (ref 3.87–5.11)
RDW: 13.9 % (ref 11.5–15.5)
RDW: 14.1 % (ref 11.5–15.5)
WBC: 10.5 10*3/uL (ref 4.0–10.5)
WBC: 9 10*3/uL (ref 4.0–10.5)
nRBC: 0 % (ref 0.0–0.2)
nRBC: 0 % (ref 0.0–0.2)

## 2018-05-09 LAB — GLUCOSE, CAPILLARY
Glucose-Capillary: 139 mg/dL — ABNORMAL HIGH (ref 70–99)
Glucose-Capillary: 103 mg/dL — ABNORMAL HIGH (ref 70–99)

## 2018-05-09 LAB — APTT: aPTT: 24 seconds (ref 24–36)

## 2018-05-09 LAB — LACTIC ACID, PLASMA: Lactic Acid, Venous: 4.6 mmol/L (ref 0.5–1.9)

## 2018-05-09 SURGERY — LOWER EXTREMITY ANGIOGRAPHY
Anesthesia: Moderate Sedation | Laterality: Right

## 2018-05-09 MED ORDER — DOCUSATE SODIUM 100 MG PO CAPS: 100.0000 mg | ORAL_CAPSULE | Freq: Two times a day (BID) | ORAL | Status: DC | PRN

## 2018-05-09 MED ORDER — SODIUM CHLORIDE 0.9 % IV SOLN
Freq: Once | INTRAVENOUS | Status: AC
  Administered 2018-05-09: 01:00:00 via INTRAVENOUS

## 2018-05-09 MED ORDER — MORPHINE SULFATE 15 MG PO TABS
15.0000 mg | ORAL_TABLET | Freq: Two times a day (BID) | ORAL | Status: DC | PRN
  Administered 2018-05-09: 15 mg via ORAL
  Filled 2018-05-09: qty 1

## 2018-05-09 MED ORDER — SODIUM CHLORIDE 0.9% FLUSH: 10.0000 mL | INTRAVENOUS | Status: DC | PRN

## 2018-05-09 MED ORDER — INSULIN ASPART 100 UNIT/ML ~~LOC~~ SOLN: 0.0000 [IU] | Freq: Every day | SUBCUTANEOUS | Status: DC

## 2018-05-09 MED ORDER — SODIUM CHLORIDE 0.9% FLUSH: 10.0000 mL | Freq: Two times a day (BID) | INTRAVENOUS | Status: DC

## 2018-05-09 MED ORDER — ENALAPRIL MALEATE 10 MG PO TABS
10.0000 mg | ORAL_TABLET | Freq: Every day | ORAL | Status: DC
  Filled 2018-05-09: qty 1

## 2018-05-09 MED ORDER — PAROXETINE HCL 20 MG PO TABS: 40.0000 mg | ORAL_TABLET | Freq: Every day | ORAL | Status: DC

## 2018-05-09 MED ORDER — ENALAPRIL MALEATE 10 MG PO TABS
10.0000 mg | ORAL_TABLET | Freq: Every day | ORAL | Status: DC
  Administered 2018-05-09: 10 mg via ORAL
  Filled 2018-05-09 (×2): qty 1

## 2018-05-09 MED ORDER — MORPHINE SULFATE 30 MG PO TABS: 30.0000 mg | ORAL_TABLET | ORAL | Status: DC | PRN

## 2018-05-09 MED ORDER — TIZANIDINE HCL 2 MG PO TABS
2.0000 mg | ORAL_TABLET | Freq: Three times a day (TID) | ORAL | Status: DC
  Filled 2018-05-09: qty 1

## 2018-05-09 MED ORDER — ATORVASTATIN CALCIUM 20 MG PO TABS
80.0000 mg | ORAL_TABLET | Freq: Every day | ORAL | Status: DC
  Administered 2018-05-09: 80 mg via ORAL
  Filled 2018-05-09: qty 4

## 2018-05-09 MED ORDER — CLINDAMYCIN PHOSPHATE 900 MG/50ML IV SOLN
900.0000 mg | Freq: Once | INTRAVENOUS | Status: AC
  Administered 2018-05-09: 900 mg via INTRAVENOUS
  Filled 2018-05-09: qty 50

## 2018-05-09 MED ORDER — MORPHINE SULFATE ER 30 MG PO TBCR: 30.0000 mg | EXTENDED_RELEASE_TABLET | Freq: Two times a day (BID) | ORAL | Status: DC

## 2018-05-09 MED ORDER — NORTRIPTYLINE HCL 25 MG PO CAPS
25.0000 mg | ORAL_CAPSULE | Freq: Every day | ORAL | Status: DC
  Administered 2018-05-09: 25 mg via ORAL
  Filled 2018-05-09: qty 1

## 2018-05-09 MED ORDER — SODIUM CHLORIDE 0.9 % IV SOLN
INTRAVENOUS | Status: DC
  Administered 2018-05-09: 05:00:00 via INTRAVENOUS

## 2018-05-09 MED ORDER — DICYCLOMINE HCL 10 MG PO CAPS
10.0000 mg | ORAL_CAPSULE | Freq: Three times a day (TID) | ORAL | Status: DC
  Filled 2018-05-09 (×4): qty 1

## 2018-05-09 MED ORDER — CLONAZEPAM 0.5 MG PO TABS
0.2500 mg | ORAL_TABLET | Freq: Two times a day (BID) | ORAL | Status: DC | PRN
  Administered 2018-05-09: 0.25 mg via ORAL
  Filled 2018-05-09 (×3): qty 1

## 2018-05-09 MED ORDER — SODIUM CHLORIDE 0.9 % IV SOLN: INTRAVENOUS | Status: DC

## 2018-05-09 MED ORDER — TIZANIDINE HCL 2 MG PO TABS
2.0000 mg | ORAL_TABLET | Freq: Three times a day (TID) | ORAL | Status: DC
  Administered 2018-05-09: 2 mg via ORAL
  Filled 2018-05-09 (×4): qty 1

## 2018-05-09 MED ORDER — NORTRIPTYLINE HCL 25 MG PO CAPS: 25.0000 mg | ORAL_CAPSULE | Freq: Every day | ORAL | Status: DC

## 2018-05-09 MED ORDER — ASPIRIN EC 81 MG PO TBEC: 81.0000 mg | DELAYED_RELEASE_TABLET | Freq: Every day | ORAL | Status: DC

## 2018-05-09 MED ORDER — ADULT MULTIVITAMIN W/MINERALS CH: 1.0000 | ORAL_TABLET | Freq: Every day | ORAL | Status: DC

## 2018-05-09 MED ORDER — DOXYCYCLINE HYCLATE 100 MG PO CAPS: 100.0000 mg | ORAL_CAPSULE | Freq: Two times a day (BID) | ORAL | 0 refills | Status: AC

## 2018-05-09 MED ORDER — MORPHINE SULFATE ER 30 MG PO TBCR
30.0000 mg | EXTENDED_RELEASE_TABLET | Freq: Two times a day (BID) | ORAL | Status: DC
  Administered 2018-05-09: 30 mg via ORAL
  Filled 2018-05-09: qty 2

## 2018-05-09 MED ORDER — HEPARIN SODIUM (PORCINE) 5000 UNIT/ML IJ SOLN: 5000.0000 [IU] | Freq: Three times a day (TID) | INTRAMUSCULAR | Status: DC

## 2018-05-09 MED ORDER — PAROXETINE HCL 20 MG PO TABS
40.0000 mg | ORAL_TABLET | Freq: Every day | ORAL | Status: DC
  Administered 2018-05-09: 40 mg via ORAL
  Filled 2018-05-09: qty 2

## 2018-05-09 MED ORDER — CLINDAMYCIN PHOSPHATE 600 MG/50ML IV SOLN
600.0000 mg | Freq: Three times a day (TID) | INTRAVENOUS | Status: DC
  Filled 2018-05-09 (×3): qty 50

## 2018-05-09 MED ORDER — HEPARIN BOLUS VIA INFUSION
4000.0000 [IU] | Freq: Once | INTRAVENOUS | Status: AC
  Administered 2018-05-09: 4000 [IU] via INTRAVENOUS
  Filled 2018-05-09: qty 4000

## 2018-05-09 MED ORDER — ASPIRIN EC 81 MG PO TBEC
81.0000 mg | DELAYED_RELEASE_TABLET | Freq: Every day | ORAL | Status: DC
  Filled 2018-05-09: qty 1

## 2018-05-09 MED ORDER — HEPARIN (PORCINE) 25000 UT/250ML-% IV SOLN
1100.0000 [IU]/h | INTRAVENOUS | Status: DC
  Administered 2018-05-09: 1100 [IU]/h via INTRAVENOUS
  Filled 2018-05-09: qty 250

## 2018-05-09 MED ORDER — INSULIN ASPART 100 UNIT/ML ~~LOC~~ SOLN: 0.0000 [IU] | Freq: Three times a day (TID) | SUBCUTANEOUS | Status: DC

## 2018-05-09 MED ORDER — AMOXICILLIN-POT CLAVULANATE 875-125 MG PO TABS: 1.0000 | ORAL_TABLET | Freq: Two times a day (BID) | ORAL | 0 refills | Status: AC

## 2018-05-09 NOTE — ED Notes (Signed)
Second blood culture sent at this time. MD made aware that antibiotic given prior to second culture.

## 2018-05-09 NOTE — Telephone Encounter (Signed)
Spoke with Dr. Letta Pate regarding [name] concern of diabetic ulcer and vascular consult as she requested. He agrees with plan, se was instructed to F/U with her PCP and Podiatrist. Call was placed no answer, left message regarding the above.

## 2018-05-09 NOTE — Discharge Summary (Signed)
[place] at [facility], [place] y.o., DOB Dec 18, 1970, MRN [id number]. Admission date: 05/08/2018 Discharge Date 05/09/2018 Primary MD Richarda Osmond, DO Admitting Physician Vaughan Basta, MD  Admission Diagnosis  PAD (peripheral artery disease) [facility]) [I73.9] Cellulitis and abscess of toe of right foot [L03.031, L02.611]  Discharge Diagnosis   Active Problems: Diabetes foot infection Likely peripheral vascular disease Diabetes type 2 Hyperlipidemia Hypertension Chronic pain with neuropathy  Hospital Course  [name]  is a 48 y.o. female with a known history of DM, IBS, neuropathy, renal stone-had some infection on his right foot-seen by podiatry Dr. few weeks ago and she is on oral antibiotic for last 3 to 4 weeks but foot infection getting worse.  She was also referred to vascular surgeon by podiatrist and she still have to see them next week. Her infection continue to get worse in spite of taking antibiotics so she came to emergency room .  In the emergency room patient had a Doppler of her lower extremity which showed poor pulses in the right foot.  Patient was admitted for further evaluation and a vascular surgery consultation.  She was started on a heparin drip.  And also IV antibiotics.  She was seen by vascular surgery PA she told the vascular surgery PA that she did not want to stay in the hospital hand has an appointment to be followed up with vascular surgery outpatient.  We recommended patient stay for further evaluation.  She is awake and alert and oriented and is able to make decisions.  At patient's request she has been discharged to home with close follow-up with podiatry and vascular surgery.  Patient states that soon as she gets home she will call her foot doctor as well as make an appointment with vascular surgeon whom she is already been referred to.          Consults vascular surgery  significant Tests:   See full reports for all details    Dg Foot Complete Right  Result Date: 05/09/2018 CLINICAL DATA:  Cellulitis.  Question osteomyelitis EXAM: RIGHT FOOT COMPLETE - 3+ VIEW COMPARISON:  04/17/2018 FINDINGS: No acute bony abnormality. Specifically, no fracture, subluxation, or dislocation. No radiographic changes of osteomyelitis. Joint spaces maintained. IMPRESSION: No acute bony abnormality. Electronically Signed   By: Rolm Baptise M.D.   On: 05/09/2018 01:06   Dg Foot Complete Right  Result Date: 04/17/2018 Please see detailed radiograph report in office note.      Today   Subjective:   [name] patient wants to go home  Objective:   Blood pressure 94/62, pulse 80, temperature 98.1 F (36.7 C), temperature source Oral, resp. rate 16, height 6' (1.829 m), weight 124.7 kg, last menstrual period 05/06/2018, SpO2 99 %.  .  Intake/Output Summary (Last 24 hours) at 05/09/2018 2017 Last data filed at 05/09/2018 0539 Gross per 24 hour  Intake 200 ml  Output -  Net 200 ml    Exam VITAL SIGNS: Blood pressure 94/62, pulse 80, temperature 98.1 F (36.7 C), temperature source Oral, resp. rate 16, height 6' (1.829 m), weight 124.7 kg, last menstrual period 05/06/2018, SpO2 99 %.  GENERAL:  48 y.o.-year-old patient lying in the bed with no acute distress.  EYES: Pupils equal, round, reactive to light and accommodation. No scleral icterus. Extraocular muscles intact.  HEENT: Head atraumatic, normocephalic. Oropharynx and nasopharynx clear.  NECK:  Supple, no jugular venous distention. No thyroid enlargement, no tenderness.  LUNGS:  Normal breath sounds bilaterally, no wheezing, rales,rhonchi or crepitation. No use of accessory muscles of respiration.  CARDIOVASCULAR: S1, S2 normal. No murmurs, rubs, or gallops.  ABDOMEN: Soft, nontender, nondistended. Bowel sounds present. No organomegaly or mass.  EXTREMITIES: Right lower extremity diminished pulses.  She also has a black-colored  area on the medial side of her first metatarsal nEUROLOGIC: Cranial nerves II through XII are intact. Muscle strength 5/5 in all extremities. Sensation intact. Gait not checked.  PSYCHIATRIC: The patient is alert and oriented x 3.  SKIN: No obvious rash, lesion, or ulcer.   Data Review     CBC w Diff:  Lab Results  Component Value Date   WBC 9.0 05/09/2018   HGB 13.6 05/09/2018   HGB 13.6 03/22/2017   HCT 41.7 05/09/2018   HCT 41.2 03/22/2017   PLT 309 05/09/2018   PLT 266 11/10/2013   LYMPHOPCT 29 02/01/2015   LYMPHOPCT 14.0 11/10/2013   MONOPCT 5 02/01/2015   MONOPCT 5.2 11/10/2013   EOSPCT 2 02/01/2015   EOSPCT 3.1 11/10/2013   BASOPCT 0 02/01/2015   BASOPCT 0.6 11/10/2013   CMP:  Lab Results  Component Value Date   NA 137 05/09/2018   NA 142 03/22/2017   NA 139 11/10/2013   K 4.0 05/09/2018   K 3.9 11/10/2013   CL 100 05/09/2018   CL 102 11/10/2013   CO2 25 05/09/2018   CO2 21 11/10/2013   BUN 6 05/09/2018   BUN 8 03/22/2017   BUN 10 11/10/2013   CREATININE 0.60 05/09/2018   CREATININE 0.49 (L) 02/01/2015   PROT 8.0 05/08/2018   PROT 7.0 03/22/2017   ALBUMIN 4.3 05/08/2018   ALBUMIN 4.1 03/22/2017   BILITOT 0.5 05/08/2018   BILITOT 0.2 03/22/2017   ALKPHOS 45 05/08/2018   AST 35 05/08/2018   ALT 32 05/08/2018  .  Micro Results Recent Results (from the past 240 hour(s))  Blood culture (routine x 2)     Status: None (Preliminary result)   Collection Time: 05/08/18 11:46 PM  Result Value Ref Range Status   Specimen Description BLOOD LEFT ASSIST CONTROL  Final   Special Requests   Final    BOTTLES DRAWN AEROBIC AND ANAEROBIC Blood Culture results may not be optimal due to an excessive volume of blood received in culture bottles   Culture   Final    NO GROWTH < 12 HOURS Performed at [facility], [address] [place], [place] [address]    Report Status PENDING  Incomplete  Blood culture (routine x 2)     Status: None (Preliminary result)    Collection Time: 05/09/18  2:00 AM  Result Value Ref Range Status   Specimen Description BLOOD LEFT HAND  Final   Special Requests   Final    BOTTLES DRAWN AEROBIC AND ANAEROBIC Blood Culture adequate volume   Culture   Final    NO GROWTH < 12 HOURS Performed at [facility], [address] [place], [place] [address]    Report Status PENDING  Incomplete     Code Status History    Date Active Date Inactive Code Status Order ID Comments User Context   05/09/2018 0228 05/09/2018 1631 Full Code [id number]  Vaughan Basta, MD Inpatient          Follow-up Information    Doristine Mango L, DO Follow up in 5 day(s).   Specialty:  Family Medicine Contact information: [address] [place] [address] [phone]  VASCULAR MD ASAP Follow up.   Contact information: PT STATES SHE HAS INFORMATION          Discharge Medications   Allergies as of 05/09/2018      Reactions   Codeine    confusion   Vancomycin    Red Man's syndrome   Nucynta [tapentadol] Other (See Comments)   headaches      Medication List    STOP taking these medications   cephALEXin 500 MG capsule Commonly known as:  KEFLEX   ciprofloxacin 250 MG tablet Commonly known as:  CIPRO   clindamycin 300 MG capsule Commonly known as:  CLEOCIN     TAKE these medications   ACCU-CHEK AVIVA PLUS test strip Generic drug:  glucose blood CHECK BLOOD SUGAR THREE TIMES DAILY   amoxicillin-clavulanate 875-125 MG tablet Commonly known as:  AUGMENTIN Take 1 tablet by mouth 2 (two) times daily for 7 days.   aspirin 81 MG tablet Take 81 mg by mouth daily.   atorvastatin 80 MG tablet Commonly known as:  LIPITOR Take 1 tablet (80 mg total) by mouth daily.   clonazePAM 0.5 MG tablet Commonly known as:  KLONOPIN TAKE 3 TABLETS BY MOUTH EVERY NIGHT AT BEDTIME AS NEEDED FOR ANXIETY   dicyclomine 10 MG capsule Commonly known as:  BENTYL TAKE 1 CAPSULE(10 MG) BY MOUTH  EVERY 8 HOURS AS NEEDED FOR SPASMS   doxycycline 100 MG capsule Commonly known as:  VIBRAMYCIN Take 1 capsule (100 mg total) by mouth 2 (two) times daily for 7 days.   enalapril 10 MG tablet Commonly known as:  VASOTEC Take 1 tablet (10 mg total) by mouth daily.   glipiZIDE 10 MG tablet Commonly known as:  GLUCOTROL Take 1 tablet (10 mg total) by mouth 2 (two) times daily before a meal.   Insulin Pen Needle 32G X 4 MM Misc Inject 1 Device into the skin daily.   metFORMIN 1000 MG tablet Commonly known as:  GLUCOPHAGE Take 1 tablet (1,000 mg total) by mouth 2 (two) times daily with a meal.   morphine 30 MG 12 hr tablet Commonly known as:  MS CONTIN Take 1 tablet (30 mg total) by mouth every 12 (twelve) hours.   morphine 30 MG tablet Commonly known as:  MSIR One tablet at HS, may take a tablet during the day   multivitamin tablet Take 1 tablet by mouth daily.   nortriptyline 25 MG capsule Commonly known as:  PAMELOR Take 1 capsule (25 mg total) by mouth at bedtime. Stop trazodone   PARoxetine 40 MG tablet Commonly known as:  PAXIL Take 1 tablet (40 mg total) by mouth daily.   tiZANidine 2 MG tablet Commonly known as:  ZANAFLEX TAKE 1 TABLET BY MOUTH THREE TIMES DAILY   VICTOZA 18 MG/3ML Sopn Generic drug:  liraglutide ADMINISTER 1.8 MG UNDER THE SKIN DAILY   VITAMIN D (CHOLECALCIFEROL) PO Take 2,400 Units by mouth daily.          Total Time in preparing paper work, data evaluation and todays exam - 53 minutes  Dustin Flock M.D on 05/09/2018 at 8:17 PM [place]  [phone]

## 2018-05-09 NOTE — ED Notes (Signed)
ED TO INPATIENT HANDOFF REPORT  ED Nurse Name and Phone #: Metta Clines RN [phone]  Name/Age/Gender [name] 48 y.o. female Room/Bed: ED03A/ED03A  Code Status   Code Status: Not on file  Home/SNF/Other Home Patient oriented to: self, place, time and situation Is this baseline? Yes   Triage Complete: Triage complete   Chief Complaint Infected wound  Triage Note Pt presents to ED with worsening foot infection/wound to the inner aspect of her right foot. Pt has been undergoing treatment with podiatrist for the past month for a diabetic foot wound that is not healing well. Has taken 3 different antibiotics with no improvement. Pt states yesterday and today the wound has become more purple and red in color with a blackened center. Denies pain but reports that she does have several neuropathy. Has been referred to "cardiovascular imaging" but has not made appt. Small amount of drainage present.    Allergies Allergies  Allergen Reactions  . Codeine     confusion  . Vancomycin     Red Man's syndrome  . Nucynta [Tapentadol] Other (See Comments)    headaches    Level of Care/Admitting Diagnosis ED Disposition    ED Disposition Condition [facility] Area: [place] [[id number]]  Level of Care: Med-Surg [16]  Diagnosis: Gangrene ([place]) [place].Felt.4.ICD-9-CM]  Admitting Physician: Vaughan Basta [[id number]]  Attending Physician: Vaughan Basta [id number]  Estimated length of stay: past midnight tomorrow  Certification:: I certify this patient will need inpatient services for at least 2 midnights  PT Class (Do Not Modify): Inpatient [101]  PT Acc Code (Do Not Modify): Private [1]       Medical/Surgery History Past Medical History:  Diagnosis Date  . DEPRESSION/ANXIETY 08/08/2009  . Diabetes mellitus without complication ([place])   . IBS 02/02/2010  . Neuropathy   . Polyneuropathy 12/04/2010  . Renal stones   . Type II or  unspecified type diabetes mellitus with neurological manifestations, 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) 08/08/2009   Past Surgical History:  Procedure Laterality Date  . ABDOMINOPLASTY    . CHOLECYSTECTOMY       IV Location/Drains/Wounds Patient Lines/Drains/Airways Status   Active Line/Drains/Airways    Name:   Placement date:   Placement time:   Site:   Days:   Peripheral IV 05/09/18 Left Antecubital   05/09/18    0032    Antecubital   less than 1          Intake/Output Last 24 hours No intake or output data in the 24 hours ending 05/09/18 0137  Labs/Imaging No results found for this or any previous visit (from the past 43 hour(s)). Dg Foot Complete Right  Result Date: 05/09/2018 CLINICAL DATA:  Cellulitis.  Question osteomyelitis EXAM: RIGHT FOOT COMPLETE - 3+ VIEW COMPARISON:  04/17/2018 FINDINGS: No acute bony abnormality. Specifically, no fracture, subluxation, or dislocation. No radiographic changes of osteomyelitis. Joint spaces maintained. IMPRESSION: No acute bony abnormality. Electronically Signed   By: Rolm Baptise M.D.   On: 05/09/2018 01:06    Pending Labs Unresulted Labs (From admission, onward)    Start     Ordered   05/09/18 0041  Comprehensive metabolic panel  ONCE - STAT,   STAT     05/09/18 0040   05/09/18 0041  Protime-INR  ONCE - STAT,   STAT     05/09/18 0040   05/09/18 0041  Lactic acid, plasma  ONCE - STAT,   STAT     05/09/18 0040  05/09/18 0041  Blood culture (routine x 2)  BLOOD CULTURE X 2,   STAT     05/09/18 0040   05/09/18 0041  APTT  ONCE - STAT,   STAT     05/09/18 0041   05/09/18 0040  CBC  ONCE - STAT,   STAT     05/09/18 0040   Signed and Held  HIV antibody (Routine Testing)  Once,   R     Signed and Held   Signed and Held  Basic metabolic panel  Tomorrow morning,   R     Signed and Held   Signed and Held  CBC  Tomorrow morning,   R     Signed and Held   Signed and Held  CBC  (heparin)  Once,   R    Comments:  Baseline for heparin  therapy IF NOT ALREADY DRAWN.  Notify MD if PLT < 100 K.    Signed and Held   Signed and Held  Creatinine, serum  (heparin)  Once,   R    Comments:  Baseline for heparin therapy IF NOT ALREADY DRAWN.    Signed and Held          Vitals/Pain Today's Vitals   05/08/18 2329 05/08/18 2338 05/09/18 0037 05/09/18 0037  BP: (!) 146/88   (!) 155/65  Pulse: (!) 117  (!) 107   Resp: 18     Temp: 98.4 F (36.9 C)     TempSrc: Oral     SpO2: 100%  96%   Weight: 124.7 kg     Height: 6' (1.829 m)     PainSc:  0-No pain      Isolation Precautions No active isolations  Medications Medications  heparin bolus via infusion 4,000 Units (has no administration in time range)  heparin ADULT infusion 100 units/mL (25000 units/250mL sodium chloride 0.45%) (has no administration in time range)  morphine (MS CONTIN) 12 hr tablet 30 mg (30 mg Oral Given 05/09/18 0054)  insulin aspart (novoLOG) injection 0-9 Units (has no administration in time range)  insulin aspart (novoLOG) injection 0-5 Units (has no administration in time range)  clindamycin (CLEOCIN) IVPB 600 mg (has no administration in time range)  clindamycin (CLEOCIN) IVPB 900 mg (900 mg Intravenous New Bag/Given 05/09/18 0059)  0.9 %  sodium chloride infusion ( Intravenous New Bag/Given 05/09/18 0058)    Mobility walks Low fall risk   Focused Assessments R foot wound   Recommendations: See Admitting Provider Note  Report given to:   Additional Notes: R ac 20g IV; neuropathy in feet/hands: morphine home med for this pain; A&Ox4; family at bedside.

## 2018-05-09 NOTE — ED Notes (Signed)
Pt given warm blanket.

## 2018-05-09 NOTE — Consult Note (Signed)
[facility] Consult Note  MRN : [id number]  [name] is a 48 y.o. ([birth year]) female who presents with chief complaint of  Chief Complaint  Patient presents with  . Wound Check   History of Present Illness:  The patient is a 48 year old female with a past medical history of type 2 diabetes, depression, anxiety, IBS, polyneuropathy, hypercholesterolemia, obesity, hypertension, chronic pain issues who presented to the [facility] emergency department last night with a chief complaint of a nonhealing right foot wound.  The patient endorses a history of a right foot wound located on the medial aspect of the dorsum being present for at least a month.  The patient is under the care of a podiatrist who has referred her to a vascular surgeon.  The patient notes that the vascular surgery practice in [place] has been contacting her to make an appointment however she has not made one yet.  Last night, the patient felt that her wound had progressively worsened even though she has been taking oral antibiotics which prompted her to seek medical attention.  The patient does note the discomfort to her right foot has also worsened.  The patient does have bilateral feet neuropathy.  The patient does have chronic pain issues and is on narcotic pain medication.  The patient has had wounds to this foot past which have healed.  This particular wound has taken the longest to heal.  The patient denies any claudication-like symptoms or rest pain.  The patient denies any drainage from this wound.  Patient denies any fever, nausea vomiting.  Vascular surgery was consulted by the emergency department internal medicine Dr. Posey Pronto for further recommendations.  Current Facility-Administered Medications  Medication Dose Route Frequency Provider Last Rate Last Dose  . 0.9 %  sodium chloride infusion   Intravenous Continuous Salary, Montell D, MD 100 mL/hr at  05/09/18 0509    . aspirin EC tablet 81 mg  81 mg Oral Daily Vaughan Basta, MD      . atorvastatin (LIPITOR) tablet 80 mg  80 mg Oral Daily Vaughan Basta, MD   80 mg at 05/09/18 0527  . clindamycin (CLEOCIN) IVPB 600 mg  600 mg Intravenous Q8H Vaughan Basta, MD      . clonazePAM Bobbye [place]) tablet 0.25 mg  0.25 mg Oral BID PRN Vaughan Basta, MD   0.25 mg at 05/09/18 0529  . dicyclomine (BENTYL) capsule 10 mg  10 mg Oral TID AC & HS Vaughan Basta, MD      . docusate sodium (COLACE) capsule 100 mg  100 mg Oral BID PRN Vaughan Basta, MD      . enalapril (VASOTEC) tablet 10 mg  10 mg Oral Daily Vaughan Basta, MD   10 mg at 05/09/18 0527  . heparin ADULT infusion 100 units/mL (25000 units/271mL sodium chloride 0.45%)  1,100 Units/hr Intravenous Continuous Gregor Hams, MD 11 mL/hr at 05/09/18 0244 1,100 Units/hr at 05/09/18 0244  . insulin aspart (novoLOG) injection 0-5 Units  0-5 Units Subcutaneous QHS Vaughan Basta, MD      . insulin aspart (novoLOG) injection 0-9 Units  0-9 Units Subcutaneous TID WC Vaughan Basta, MD      . morphine (MS CONTIN) 12 hr tablet 30 mg  30 mg Oral Q12H Gregor Hams, MD   30 mg at 05/09/18 0054  . morphine (MSIR) tablet 15 mg  15 mg Oral BID PRN Vaughan Basta, MD   15 mg at 05/09/18 0528  .  multivitamin with minerals tablet 1 tablet  1 tablet Oral Daily Vaughan Basta, MD      . nortriptyline (PAMELOR) capsule 25 mg  25 mg Oral QHS Vaughan Basta, MD   25 mg at 05/09/18 0529  . PARoxetine (PAXIL) tablet 40 mg  40 mg Oral Daily Vaughan Basta, MD   40 mg at 05/09/18 0527  . sodium chloride flush (NS) 0.9 % injection 10-40 mL  10-40 mL Intracatheter Q12H Dustin Flock, MD      . sodium chloride flush (NS) 0.9 % injection 10-40 mL  10-40 mL Intracatheter PRN Dustin Flock, MD      . tiZANidine (ZANAFLEX) tablet 2 mg  2 mg Oral TID Vaughan Basta, MD   2 mg at 05/09/18 6213   Past Medical History:  Diagnosis Date  . DEPRESSION/ANXIETY 08/08/2009  . Diabetes mellitus without complication ([place])   . IBS 02/02/2010  . Neuropathy   . Polyneuropathy 12/04/2010  . Renal stones   . 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) 08/08/2009   Past Surgical History:  Procedure Laterality Date  . ABDOMINOPLASTY    . CHOLECYSTECTOMY     Social History Social History   Tobacco Use  . Smoking status: Never Smoker  . Smokeless tobacco: Never Used  Substance Use Topics  . Alcohol use: No    Alcohol/week: 0.0 standard drinks  . Drug use: No    Comment: rx opiods   Family History Family History  Problem Relation Age of Onset  . Fibromyalgia Mother   . Aortic aneurysm Mother   . Stroke Maternal Grandmother   . Colon cancer Maternal Grandmother   . Heart Problems Paternal Grandmother   . Dementia Paternal Grandmother   . Heart attack Paternal Grandfather   Denies any family history of peripheral artery disease, venous disease or bleeding/clotting disorders.  Allergies  Allergen Reactions  . Codeine     confusion  . Vancomycin     Red Man's syndrome  . Nucynta [Tapentadol] Other (See Comments)    headaches   REVIEW OF SYSTEMS (Negative unless checked)  Constitutional: [] Weight loss  [] Fever  [] Chills Cardiac: [] Chest pain   [] Chest pressure   [] Palpitations   [] Shortness of breath when laying flat   [] Shortness of breath at rest   [] Shortness of breath with exertion. Vascular:  [] Pain in legs with walking   [] Pain in legs at rest   [] Pain in legs when laying flat   [] Claudication   [x] Pain in feet when walking  [x] Pain in feet at rest  [x] Pain in feet when laying flat   [] History of DVT   [] Phlebitis   [x] Swelling in legs   [] Varicose veins   [x] Non-healing ulcers Pulmonary:   [] Uses home oxygen   [] Productive cough   [] Hemoptysis   [] Wheeze  [] COPD    [] Asthma Neurologic:  [] Dizziness  [] Blackouts   [] Seizures   [] History of stroke   [] History of TIA  [] Aphasia   [] Temporary blindness   [] Dysphagia   [] Weakness or numbness in arms   [] Weakness or numbness in legs Musculoskeletal:  [] Arthritis   [] Joint swelling   [] Joint pain   [] Low back pain Hematologic:  [] Easy bruising  [] Easy bleeding   [] Hypercoagulable state   [] Anemic  [] Hepatitis Gastrointestinal:  [] Blood in stool   [] Vomiting blood  [] Gastroesophageal reflux/heartburn   [] Difficulty swallowing. Genitourinary:  [] Chronic kidney disease   [] Difficult urination  [] Frequent urination  [] Burning with urination   []   Blood in urine Skin:  [] Rashes   [x] Ulcers   [x] Wounds Psychological:  [] History of anxiety   []  History of major depression.  Physical Examination  Vitals:   05/09/18 0145 05/09/18 0200 05/09/18 0234 05/09/18 0803  BP:  127/74 136/83 94/62  Pulse: (!) 105 (!) 103 (!) 103 80  Resp:   18 16  Temp:   98.5 F (36.9 C) 98.1 F (36.7 C)  TempSrc:   Oral Oral  SpO2: 97% 98% 100% 99%  Weight:      Height:       Body mass index is 37.3 kg/m. Gen:  WD/WN, NAD Head: [place]/AT, No temporalis wasting. Prominent temp pulse not noted. Ear/Nose/Throat: Hearing grossly intact, nares w/o erythema or drainage, oropharynx w/o Erythema/Exudate Eyes: Sclera non-icteric, conjunctiva clear Neck: Trachea midline.  No JVD.  Pulmonary:  Good air movement, respirations not labored, equal bilaterally.  Cardiac: RRR, normal S1, S2. Vascular:  Vessel Right Left  Radial Palpable Palpable  Ulnar Palpable Palpable  Brachial Palpable Palpable  Carotid Palpable, without bruit Palpable, without bruit  Aorta Not palpable N/A  Femoral Palpable Palpable  Popliteal Palpable Palpable  PT Non-Palpable Non-Palpable  DP Non-Palpable Non-Palpable   Right lower extremity: The extremity is warm distally.  The midfoot to the toes do become cooler.  Thigh is soft.  Calf is soft.  Nontender to palpation.   Motor/sensory is intact.  Gastrointestinal: soft, non-tender/non-distended. No guarding/reflex.  Musculoskeletal: M/S 5/5 throughout.  Extremities without ischemic changes.  No deformity or atrophy. No edema. Neurologic: Sensation grossly intact in extremities.  Symmetrical.  Speech is fluent. Motor exam as listed above. Psychiatric: Judgment intact, Mood & affect appropriate for pt's clinical situation. Dermatologic: There is reddish discoloration to the toes on the right foot.  There is a 3 cm x 2 cm dry eschar noted to the medial dorsum of the right foot.  There is mild to moderate edema to that foot. Lymph : No Cervical, Axillary, or Inguinal lymphadenopathy.  CBC Lab Results  Component Value Date   WBC 9.0 05/09/2018   HGB 13.6 05/09/2018   HCT 41.7 05/09/2018   MCV 84.4 05/09/2018   PLT 309 05/09/2018   BMET    Component Value Date/Time   NA 137 05/09/2018 0550   NA 142 03/22/2017 1541   NA 139 11/10/2013 1718   K 4.0 05/09/2018 0550   K 3.9 11/10/2013 1718   CL 100 05/09/2018 0550   CL 102 11/10/2013 1718   CO2 25 05/09/2018 0550   CO2 21 11/10/2013 1718   GLUCOSE 186 (H) 05/09/2018 0550   GLUCOSE 145 (H) 11/10/2013 1718   BUN 6 05/09/2018 0550   BUN 8 03/22/2017 1541   BUN 10 11/10/2013 1718   CREATININE 0.60 05/09/2018 0550   CREATININE 0.49 (L) 02/01/2015 1631   CALCIUM 9.9 05/09/2018 0550   CALCIUM 8.6 11/10/2013 1718   GFRNONAA >60 05/09/2018 0550   GFRNONAA >60 11/10/2013 1718   GFRAA >60 05/09/2018 0550   GFRAA >60 11/10/2013 1718   Estimated Creatinine Clearance: 124.4 mL/min (by C-G formula based on SCr of 0.6 mg/dL).  COAG Lab Results  Component Value Date   INR 0.88 05/09/2018   Radiology Dg Foot Complete Right  Result Date: 05/09/2018 CLINICAL DATA:  Cellulitis.  Question osteomyelitis EXAM: RIGHT FOOT COMPLETE - 3+ VIEW COMPARISON:  04/17/2018 FINDINGS: No acute bony abnormality. Specifically, no fracture, subluxation, or dislocation. No  radiographic changes of osteomyelitis. Joint spaces maintained. IMPRESSION: No acute bony abnormality.  Electronically Signed   By: Rolm Baptise M.D.   On: 05/09/2018 01:06   Dg Foot Complete Right  Result Date: 04/17/2018 Please see detailed radiograph report in office note.  Assessment/Plan The patient is a 48 year old female with a past medical history of type 2 diabetes, depression, anxiety, IBS, polyneuropathy, hypercholesterolemia, obesity, hypertension, chronic pain issues who presented to the [facility] emergency department last night with a chief complaint of a nonhealing right foot wound. 1. PAD: The patient does have multiple risk factors for peripheral artery disease.  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  The patient has been referred to the vascular surgery service in [place].  The patient notes that they have reached out to her to make an appointment however she has not done so.  In the setting of a nonhealing ulceration given her risk factors for peripheral artery disease strongly recommended a right lower extremity angiogram with possible intervention to assess the patient's anatomy and contributing degree of peripheral artery disease.  I explained that if any significant hemodynamically significant disease was found at the time of the angiogram it could be corrected through angioplasty or stent placement.  The procedure, risks and benefits were explained to the patient.  We also discussed the risks of not moving forward with the procedure as worsening of the wound, sepsis, osteomyelitis, tissue loss etc.  At this time, the patient does not wish to move forward with the angiogram I would like to follow-up as an outpatient with the [facility] practice.  Medical management with aspirin and a statin is appropriate.  Since the patient does not wish to move forward with any vascular intervention  vascular surgery will sign off at this time.  Please reconsult if needed. 2. Hyperlipidemia: Patient is on aspirin and statin for medical management of peripheral artery disease. Encouraged good control as its slows the progression of atherosclerotic disease. 3. Diabetes: Encouraged good control as its slows the progression of atherosclerotic disease. 4. Hypertension: Encouraged good control as its slows the progression of atherosclerotic disease.  Discussed with Dr. Francene Castle, PA-C  05/09/2018 10:35 AM  This note was created with Dragon medical transcription system.  Any error is purely unintentional.

## 2018-05-09 NOTE — Plan of Care (Signed)
  Problem: Spiritual Needs Goal: Ability to function at adequate level Outcome: Adequate for Discharge   

## 2018-05-09 NOTE — ED Notes (Addendum)
Per pt blood cultures and lactic acid taken in triage. Per lab only 1 set was collected. Will collect 2nd.

## 2018-05-09 NOTE — ED Notes (Signed)
Pt is being admitted to [facility]. Pt is AOx4, vss, she denies any pain at this time and does not show any signs of distress. PT/INR has been sent to the lab and we are awaiting results and awaiting heparin drip to be sent up from pharmacy. Report was called and given to [facility], RN and she verbalized understanding of the report given.

## 2018-05-09 NOTE — ED Notes (Signed)
EDP notified that only one culture collected before antibiotic was collected. EDP Owens Shark okay with 2nd collection post 1st antibiotic.

## 2018-05-09 NOTE — Telephone Encounter (Signed)
[name] called stating she was visiting her mother yesterday, she noticed foot ulcer on right was black. She went to [facility] ED, they wanted to admit her, and perform a right foot angiogram she states the hospital wouldn;t give her the morphine and she was feeling bad ( possible withdrawal). She was instructed to call her PCP and podiatrist regarding the above. She verbalizes understanding. Also reports she has an appointment with Vascular.

## 2018-05-09 NOTE — ED Provider Notes (Signed)
[name]   First MD Initiated Contact with Patient 05/08/18 2350     (approximate)  I have reviewed the triage vital signs and the nursing notes.   HISTORY  Chief Complaint Wound Check    HPI [name] is a 48 y.o. female below list of chronic medical conditions including diabetes mellitus with diabetic neuropathy as well as sialitis to the right foot with the patient states that is been occurring for the past month.  Patient states that she is taken 3 different antibiotics without any improvement in symptoms.  Patient states that starting today she noticed a purple-black area in the central portion of her right foot as well as purplish discoloration to her right third toe which prompted her visit to the emergency department.  Patient denies any fever.  Patient denies any nausea vomiting.  Patient states that she has no sensation to her right lower extremity and as such does not have any pain in the area.   Past Medical History:  Diagnosis Date  . DEPRESSION/ANXIETY 08/08/2009  . Diabetes mellitus without complication ([place])   . IBS 02/02/2010  . Neuropathy   . Polyneuropathy 12/04/2010  . Renal stones   . 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) 08/08/2009    Patient Active Problem List   Diagnosis Date Noted  . Gangrene ([place]) 05/09/2018  . Vegetarian diet 03/25/2017  . Tinnitus of both ears 09/22/2016  . Somnolence, daytime 07/18/2015  . Abnormal uterine bleeding (AUB) 07/13/2015  . Fatigue 02/01/2015  . Soft tissue masses 07/01/2014  . Essential hypertension, benign 08/14/2012  . Chronic foot ulcer ([place]) 06/14/2011  . Chronic pain 04/23/2011  . Obesity, Class II, BMI 35-39.9, with comorbidity 02/10/2011  . Hypertriglyceridemia 12/06/2010  . Polyneuropathy 12/04/2010  . IBS 02/02/2010  . Type 2 diabetes mellitus with diabetic  peripheral angiopathy with gangrene ([place]) 08/08/2009  . DEPRESSION/ANXIETY 08/08/2009    Past Surgical History:  Procedure Laterality Date  . ABDOMINOPLASTY    . CHOLECYSTECTOMY      Prior to Admission medications   Medication Sig Start Date End Date Taking? Authorizing Provider  ACCU-CHEK AVIVA PLUS test strip CHECK BLOOD SUGAR THREE TIMES DAILY 09/17/17  Yes Rogue Bussing, MD  aspirin 81 MG tablet Take 81 mg by mouth daily.   Yes [provider]  atorvastatin (LIPITOR) 80 MG tablet Take 1 tablet (80 mg total) by mouth daily. 12/02/17  Yes Martyn Malay, MD  clonazePAM (KLONOPIN) 0.5 MG tablet TAKE 3 TABLETS BY MOUTH EVERY NIGHT AT BEDTIME AS NEEDED FOR ANXIETY 03/17/18  Yes Kirsteins, Luanna Salk, MD  dicyclomine (BENTYL) 10 MG capsule TAKE 1 CAPSULE(10 MG) BY MOUTH EVERY 8 HOURS AS NEEDED FOR SPASMS 03/01/17  Yes Armbruster, Carlota Raspberry, MD  enalapril (VASOTEC) 10 MG tablet Take 1 tablet (10 mg total) by mouth daily. 02/10/18  Yes Anderson, Chelsey L, DO  glipiZIDE (GLUCOTROL) 10 MG tablet Take 1 tablet (10 mg total) by mouth 2 (two) times daily before a meal. 08/06/17  Yes Fitzgerald, Sharman Cheek, MD  Insulin Pen Needle 32G X 4 MM MISC Inject 1 Device into the skin daily. 07/15/17  Yes Rogue Bussing, MD  metFORMIN (GLUCOPHAGE) 1000 MG tablet Take 1 tablet (1,000 mg total) by mouth 2 (two) times daily with a meal. 08/06/17  Yes Rogue Bussing, MD  morphine (MS CONTIN) 30 MG 12 hr tablet Take 1 tablet (  30 mg total) by mouth every 12 (twelve) hours. 04/15/18  Yes Bayard Hugger, NP  morphine (MSIR) 30 MG tablet One tablet at HS, may take a tablet during the day 04/15/18  Yes Bayard Hugger, NP  Multiple Vitamin (MULTIVITAMIN) tablet Take 1 tablet by mouth daily.   Yes [provider]  nortriptyline (PAMELOR) 25 MG capsule Take 1 capsule (25 mg total) by mouth at bedtime. Stop trazodone 02/28/18  Yes Kirsteins, Luanna Salk, MD  PARoxetine (PAXIL) 40 MG  tablet Take 1 tablet (40 mg total) by mouth daily. 12/02/17  Yes Martyn Malay, MD  tiZANidine (ZANAFLEX) 2 MG tablet TAKE 1 TABLET BY MOUTH THREE TIMES DAILY 03/17/18  Yes Kirsteins, Luanna Salk, MD  VICTOZA 18 MG/3ML SOPN ADMINISTER 1.8 MG UNDER THE SKIN DAILY 04/07/18  Yes Anderson, Chelsey L, DO  VITAMIN D, CHOLECALCIFEROL, PO Take 2,400 Units by mouth daily.   Yes [provider]  cephALEXin (KEFLEX) 500 MG capsule TAKE 1 CAPSULE(500 MG) BY MOUTH TWICE DAILY Patient not taking: Reported on 05/09/2018 04/14/18   Evelina Bucy, DPM  cephALEXin (KEFLEX) 500 MG capsule Take 1 capsule (500 mg total) by mouth 2 (two) times daily. Patient not taking: Reported on 05/09/2018 04/14/18   Evelina Bucy, DPM  ciprofloxacin (CIPRO) 250 MG tablet Take 1 tablet (250 mg total) by mouth 2 (two) times daily. Patient not taking: Reported on 05/09/2018 04/17/18   Evelina Bucy, DPM  clindamycin (CLEOCIN) 300 MG capsule Take 1 capsule (300 mg total) by mouth 2 (two) times daily. Patient not taking: Reported on 05/09/2018 04/17/18   Evelina Bucy, DPM    Allergies Codeine; Vancomycin; and Nucynta [tapentadol]  Family History  Problem Relation Age of Onset  . Fibromyalgia Mother   . Aortic aneurysm Mother   . Stroke Maternal Grandmother   . Colon cancer Maternal Grandmother   . Heart Problems Paternal Grandmother   . Dementia Paternal Grandmother   . Heart attack Paternal Grandfather     Social History Social History   Tobacco Use  . Smoking status: Never Smoker  . Smokeless tobacco: Never Used  Substance Use Topics  . Alcohol use: No    Alcohol/week: 0.0 standard drinks  . Drug use: No    Comment: rx opiods    Review of Systems Constitutional: No fever/chills Eyes: No visual changes. ENT: No sore throat. Cardiovascular: Denies chest pain. Respiratory: Denies shortness of breath. Gastrointestinal: No abdominal pain.  No nausea, no vomiting.  No diarrhea.  No  constipation. Genitourinary: Negative for dysuria. Musculoskeletal: Negative for neck pain.  Negative for back pain.  Positive for right foot redness/black area. Integumentary: Negative for rash. Neurological: Negative for headaches, focal weakness or numbness.  ____________________________________________   PHYSICAL EXAM:  VITAL SIGNS: ED Triage Vitals  Enc Vitals Group     BP 05/08/18 2329 (!) 146/88     Pulse Rate 05/08/18 2329 (!) 117     Resp 05/08/18 2329 18     Temp 05/08/18 2329 98.4 F (36.9 C)     Temp Source 05/08/18 2329 Oral     SpO2 05/08/18 2329 100 %     Weight 05/08/18 2329 124.7 kg (275 lb)     Height 05/08/18 2329 1.829 m (6')     Head Circumference --      Peak Flow --      Pain Score 05/08/18 2338 0     Pain Loc --      Pain  Edu? --      Excl. in [place]? --     Constitutional: Alert and oriented. Well appearing and in no acute distress. Eyes: Conjunctivae are normal. Mouth/Throat: Mucous membranes are moist. Oropharynx non-erythematous. Neck: No stridor.   Cardiovascular: Normal rate, regular rhythm. Good peripheral circulation. Grossly normal heart sounds. Respiratory: Normal respiratory effort.  No retractions. Lungs CTAB. Gastrointestinal: Soft and nontender. No distention.  Musculoskeletal: Blanching erythema medial aspect of the right foot with gangrenous appearing bulla centrally just inferior to the great toe.  Also erythema noted to the third toe of the right foot with dusky appearance as well please refer to picture below      Neurologic:  Normal speech and language. No gross focal neurologic deficits are appreciated.  Skin: Please refer to pictures above Psychiatric: Mood and affect are normal. Speech and behavior are normal.  ____________________________________________   LABS (all labs ordered are listed, but only abnormal results are displayed)  Labs Reviewed  CBC - Abnormal; Notable for the following components:      Result Value    RBC 5.49 (*)    HCT 46.9 (*)    All other components within normal limits  COMPREHENSIVE METABOLIC PANEL - Abnormal; Notable for the following components:   Glucose, Bld 162 (*)    Calcium 10.5 (*)    All other components within normal limits  LACTIC ACID, PLASMA - Abnormal; Notable for the following components:   Lactic Acid, Venous 4.6 (*)    All other components within normal limits  GLUCOSE, CAPILLARY - Abnormal; Notable for the following components:   Glucose-Capillary 139 (*)    All other components within normal limits  CULTURE, BLOOD (ROUTINE X 2)  CULTURE, BLOOD (ROUTINE X 2)  PROTIME-INR  APTT  HEPARIN LEVEL (UNFRACTIONATED)  HIV ANTIBODY (ROUTINE TESTING W REFLEX)  BASIC METABOLIC PANEL  CBC   ____________  RADIOLOGY I, [place] Lachelle Rissler, personally viewed and evaluated these images (plain radiographs) as part of my medical decision making, as well as reviewing the written report by the radiologist.  ED MD interpretation:  No acute bony abnormality.  Official radiology report(s): Dg Foot Complete Right  Result Date: 05/09/2018 CLINICAL DATA:  Cellulitis.  Question osteomyelitis EXAM: RIGHT FOOT COMPLETE - 3+ VIEW COMPARISON:  04/17/2018 FINDINGS: No acute bony abnormality. Specifically, no fracture, subluxation, or dislocation. No radiographic changes of osteomyelitis. Joint spaces maintained. IMPRESSION: No acute bony abnormality. Electronically Signed   By: Rolm Baptise M.D.   On: 05/09/2018 01:06    Critical Care performed:  .Critical Care Performed by: Vanessa Hams, MD Authorized by: Vanessa Hams, MD   Critical care provider statement:    Critical care time (minutes):  45   Critical care time was exclusive of:  Separately billable procedures and treating other patients (arterial occlusion)   Critical care was time spent personally by me on the following activities:  Development of treatment plan with patient or surrogate, discussions with  consultants, evaluation of patient's response to treatment, examination of patient, obtaining history from patient or surrogate, ordering and performing treatments and interventions, ordering and review of laboratory studies, ordering and review of radiographic studies, pulse oximetry, re-evaluation of patient's condition and review of old charts   I assumed direction of critical care for this patient from another provider in my specialty: no       ____________________________________________   INITIAL IMPRESSION / ASSESSMENT AND PLAN / ED COURSE  As part of my medical decision making, I  reviewed the following data within the [place] NUMBER   48 year old female presents with above-stated history and physical exam with concern for arterial occlusion to the right lower extremity with associated cellulitis.  Laboratory data notable for lactic acid of 4.6.  patient given IV clindamycin secondary to "red man" syndrome which occurred when she received IV vancomycin in the past.  In addition patient discussed with Dr. Delana Meyer vascular surgeon on-call who agreed with plan for heparin administration and as such patient received heparin bolus followed by infusion.  Patient also discussed with Dr. Anselm Jungling hospitalist on-call for hospital admission further evaluation and management.   ____________________________________________  FINAL CLINICAL IMPRESSION(S) / ED DIAGNOSES  Peripheral artery disease with suspicion for arterial occlusion Right foot cellulitis   MEDICATIONS GIVEN DURING THIS VISIT:  Medications  heparin ADULT infusion 100 units/mL (25000 units/262mL sodium chloride 0.45%) (1,100 Units/hr Intravenous New Bag/Given 05/09/18 0244)  morphine (MS CONTIN) 12 hr tablet 30 mg (30 mg Oral Given 05/09/18 0054)  aspirin EC tablet 81 mg (has no administration in time range)  morphine (MSIR) tablet 15 mg (has no administration in time range)  atorvastatin (LIPITOR) tablet 80 mg (has  no administration in time range)  nortriptyline (PAMELOR) capsule 25 mg (has no administration in time range)  dicyclomine (BENTYL) capsule 10 mg (has no administration in time range)  clonazePAM (KLONOPIN) tablet 0.25 mg (has no administration in time range)  multivitamin with minerals tablet 1 tablet (has no administration in time range)  docusate sodium (COLACE) capsule 100 mg (has no administration in time range)  insulin aspart (novoLOG) injection 0-9 Units (has no administration in time range)  insulin aspart (novoLOG) injection 0-5 Units (0 Units Subcutaneous Not Given 05/09/18 0240)  clindamycin (CLEOCIN) IVPB 600 mg (has no administration in time range)  0.9 %  sodium chloride infusion (has no administration in time range)  tiZANidine (ZANAFLEX) tablet 2 mg (has no administration in time range)  enalapril (VASOTEC) tablet 10 mg (has no administration in time range)  PARoxetine (PAXIL) tablet 40 mg (has no administration in time range)  clindamycin (CLEOCIN) IVPB 900 mg (0 mg Intravenous Stopped 05/09/18 0130)  heparin bolus via infusion 4,000 Units (4,000 Units Intravenous Bolus from Bag 05/09/18 0243)  0.9 %  sodium chloride infusion ( Intravenous New Bag/Given 05/09/18 0058)     ED Discharge Orders    None       Guerra:  This document was prepared using Dragon voice recognition software and may include unintentional dictation errors.   Vanessa Hams, MD 05/09/18 [phone]

## 2018-05-09 NOTE — H&P (Signed)
[facility] [place] NAME: [name]    MR#:  [id number]  DATE OF BIRTH:  02/11/1971  DATE OF ADMISSION:  05/08/2018  PRIMARY CARE PHYSICIAN: Richarda Osmond, DO   REQUESTING/REFERRING PHYSICIAN: Owens Shark  CHIEF COMPLAINT:   Chief Complaint  Patient presents with  . Wound Check    HISTORY OF PRESENT ILLNESS: [name]  is a 48 y.o. female with a known history of DM, IBS, neuropathy, renal stone-had some infection on his right foot-seen by podiatry Dr. few weeks ago and she is on oral antibiotic for last 3 to 4 weeks but foot infection getting worse.  She was also referred to vascular surgeon by podiatrist and she still have to see them next week. Her infection continue to get worse in spite of taking antibiotics so she came to emergency room today. She has diabetic neuropathy and she does not feel any pain.  Her pulses were difficult to palpate so ER physician did Doppler and it showed decreased circulation so I spoke to vascular surgeon on call who suggested to start on heparin drip and IV antibiotic for now.  PAST MEDICAL HISTORY:   Past Medical History:  Diagnosis Date  . DEPRESSION/ANXIETY 08/08/2009  . Diabetes mellitus without complication ([place])   . IBS 02/02/2010  . Neuropathy   . Polyneuropathy 12/04/2010  . Renal stones   . 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) 08/08/2009    PAST SURGICAL HISTORY:  Past Surgical History:  Procedure Laterality Date  . ABDOMINOPLASTY    . CHOLECYSTECTOMY      SOCIAL HISTORY:  Social History   Tobacco Use  . Smoking status: Never Smoker  . Smokeless tobacco: Never Used  Substance Use Topics  . Alcohol use: No    Alcohol/week: 0.0 standard drinks    FAMILY HISTORY:  Family History  Problem Relation Age of Onset  . Stroke Maternal Grandmother   . Colon cancer Maternal Grandmother   . Heart  Problems Paternal Grandmother   . Dementia Paternal Grandmother   . Heart attack Paternal Grandfather     DRUG ALLERGIES:  Allergies  Allergen Reactions  . Codeine     confusion  . Vancomycin     Red Man's syndrome  . Nucynta [Tapentadol] Other (See Comments)    headaches    REVIEW OF SYSTEMS:   CONSTITUTIONAL: No fever, fatigue or weakness.  EYES: No blurred or double vision.  EARS, NOSE, AND THROAT: No tinnitus or ear pain.  RESPIRATORY: No cough, shortness of breath, wheezing or hemoptysis.  CARDIOVASCULAR: No chest pain, orthopnea, edema.  GASTROINTESTINAL: No nausea, vomiting, diarrhea or abdominal pain.  GENITOURINARY: No dysuria, hematuria.  ENDOCRINE: No polyuria, nocturia,  HEMATOLOGY: No anemia, easy bruising or bleeding SKIN: No rash or lesion. MUSCULOSKELETAL: Right foot pain. NEUROLOGIC: No tingling, numbness, weakness.  PSYCHIATRY: No anxiety or depression.   MEDICATIONS AT HOME:  Prior to Admission medications   Medication Sig Start Date End Date Taking? Authorizing Provider  ACCU-CHEK AVIVA PLUS test strip CHECK BLOOD SUGAR THREE TIMES DAILY 09/17/17  Yes Rogue Bussing, MD  aspirin 81 MG tablet Take 81 mg by mouth daily.   Yes [provider]  atorvastatin (LIPITOR) 80 MG tablet Take 1 tablet (80 mg total) by mouth daily. 12/02/17  Yes Martyn Malay, MD  clonazePAM (KLONOPIN) 0.5 MG tablet TAKE 3 TABLETS BY MOUTH EVERY NIGHT AT BEDTIME AS NEEDED FOR  ANXIETY 03/17/18  Yes Kirsteins, Luanna Salk, MD  dicyclomine (BENTYL) 10 MG capsule TAKE 1 CAPSULE(10 MG) BY MOUTH EVERY 8 HOURS AS NEEDED FOR SPASMS 03/01/17  Yes Armbruster, Carlota Raspberry, MD  enalapril (VASOTEC) 10 MG tablet Take 1 tablet (10 mg total) by mouth daily. 02/10/18  Yes Anderson, Chelsey L, DO  glipiZIDE (GLUCOTROL) 10 MG tablet Take 1 tablet (10 mg total) by mouth 2 (two) times daily before a meal. 08/06/17  Yes Fitzgerald, Sharman Cheek, MD  Insulin Pen Needle 32G X 4 MM MISC Inject 1  Device into the skin daily. 07/15/17  Yes Rogue Bussing, MD  metFORMIN (GLUCOPHAGE) 1000 MG tablet Take 1 tablet (1,000 mg total) by mouth 2 (two) times daily with a meal. 08/06/17  Yes Rogue Bussing, MD  morphine (MS CONTIN) 30 MG 12 hr tablet Take 1 tablet (30 mg total) by mouth every 12 (twelve) hours. 04/15/18  Yes Bayard Hugger, NP  morphine (MSIR) 30 MG tablet One tablet at HS, may take a tablet during the day 04/15/18  Yes Bayard Hugger, NP  Multiple Vitamin (MULTIVITAMIN) tablet Take 1 tablet by mouth daily.   Yes [provider]  nortriptyline (PAMELOR) 25 MG capsule Take 1 capsule (25 mg total) by mouth at bedtime. Stop trazodone 02/28/18  Yes Kirsteins, Luanna Salk, MD  PARoxetine (PAXIL) 40 MG tablet Take 1 tablet (40 mg total) by mouth daily. 12/02/17  Yes Martyn Malay, MD  tiZANidine (ZANAFLEX) 2 MG tablet TAKE 1 TABLET BY MOUTH THREE TIMES DAILY 03/17/18  Yes Kirsteins, Luanna Salk, MD  VICTOZA 18 MG/3ML SOPN ADMINISTER 1.8 MG UNDER THE SKIN DAILY 04/07/18  Yes Anderson, Chelsey L, DO  VITAMIN D, CHOLECALCIFEROL, PO Take 2,400 Units by mouth daily.   Yes [provider]  cephALEXin (KEFLEX) 500 MG capsule TAKE 1 CAPSULE(500 MG) BY MOUTH TWICE DAILY Patient not taking: Reported on 05/09/2018 04/14/18   Evelina Bucy, DPM  cephALEXin (KEFLEX) 500 MG capsule Take 1 capsule (500 mg total) by mouth 2 (two) times daily. Patient not taking: Reported on 05/09/2018 04/14/18   Evelina Bucy, DPM  ciprofloxacin (CIPRO) 250 MG tablet Take 1 tablet (250 mg total) by mouth 2 (two) times daily. Patient not taking: Reported on 05/09/2018 04/17/18   Evelina Bucy, DPM  clindamycin (CLEOCIN) 300 MG capsule Take 1 capsule (300 mg total) by mouth 2 (two) times daily. Patient not taking: Reported on 05/09/2018 04/17/18   Evelina Bucy, DPM      PHYSICAL EXAMINATION:   VITAL SIGNS: Blood pressure 134/88, pulse (!) 104, temperature 98.4 F (36.9 C), temperature  source Oral, resp. rate 18, height 6' (1.829 m), weight 124.7 kg, last menstrual period 05/06/2018, SpO2 97 %.  GENERAL:  48 y.o.-year-old patient lying in the bed with no acute distress.  EYES: Pupils equal, round, reactive to light and accommodation. No scleral icterus. Extraocular muscles intact.  HEENT: Head atraumatic, normocephalic. Oropharynx and nasopharynx clear.  NECK:  Supple, no jugular venous distention. No thyroid enlargement, no tenderness.  LUNGS: Normal breath sounds bilaterally, no wheezing, rales,rhonchi or crepitation. No use of accessory muscles of respiration.  CARDIOVASCULAR: S1, S2 normal. No murmurs, rubs, or gallops.  ABDOMEN: Soft, nontender, nondistended. Bowel sounds present. No organomegaly or mass.  EXTREMITIES: Right foot has black-colored area on the medial side at the base of first metatarsal.  Her third toe on right foot is slightly discolored.  I could not feel dorsalis pedis pulses.Marland Kitchen  NEUROLOGIC: Cranial nerves II through XII are intact. Muscle strength 5/5 in all extremities. Sensation intact. Gait not checked.  PSYCHIATRIC: The patient is alert and oriented x 3.  SKIN: No obvious rash, lesion, or ulcer.   LABORATORY PANEL:   CBC Recent Labs  Lab 05/08/18 2346  WBC 10.5  HGB 15.0  HCT 46.9*  PLT 317  MCV 85.4  MCH 27.3  MCHC 32.0  RDW 13.9   ------------------------------------------------------------------------------------------------------------------  Chemistries  No results for input(s): NA, K, CL, CO2, GLUCOSE, BUN, CREATININE, CALCIUM, MG, AST, ALT, ALKPHOS, BILITOT in the last 168 hours.  Invalid input(s): GFRCGP ------------------------------------------------------------------------------------------------------------------ CrCl cannot be calculated (Patient's most recent lab result is older than the maximum 21 days  allowed.). ------------------------------------------------------------------------------------------------------------------ No results for input(s): TSH, T4TOTAL, T3FREE, THYROIDAB in the last 72 hours.  Invalid input(s): FREET3   Coagulation profile No results for input(s): INR, PROTIME in the last 168 hours. ------------------------------------------------------------------------------------------------------------------- No results for input(s): DDIMER in the last 72 hours. -------------------------------------------------------------------------------------------------------------------  Cardiac Enzymes No results for input(s): CKMB, TROPONINI, MYOGLOBIN in the last 168 hours.  Invalid input(s): CK ------------------------------------------------------------------------------------------------------------------ Invalid input(s): POCBNP  ---------------------------------------------------------------------------------------------------------------  Urinalysis    Component Value Date/Time   COLORURINE YELLOW 06/06/2015 1425   APPEARANCEUR Clear 11/20/2017 1553   LABSPEC 1.025 06/06/2015 1425   LABSPEC 1.030 11/10/2013 1718   PHURINE 5.0 06/06/2015 1425   GLUCOSEU Negative 11/20/2017 1553   GLUCOSEU Negative 11/10/2013 1718   HGBUR NEGATIVE 06/06/2015 1425   BILIRUBINUR Negative 11/20/2017 1553   BILIRUBINUR Negative 11/10/2013 1718   KETONESUR small (15) (A) 07/11/2017 1512   KETONESUR 15 (A) 06/06/2015 1425   PROTEINUR 1+ (A) 11/20/2017 1553   PROTEINUR 100 (A) 06/06/2015 1425   UROBILINOGEN 0.2 07/11/2017 1512   UROBILINOGEN 0.2 07/27/2009 0945   NITRITE Negative 11/20/2017 1553   NITRITE NEGATIVE 06/06/2015 1425   LEUKOCYTESUR Negative 11/20/2017 1553   LEUKOCYTESUR Negative 11/10/2013 1718     RADIOLOGY: Dg Foot Complete Right  Result Date: 05/09/2018 CLINICAL DATA:  Cellulitis.  Question osteomyelitis EXAM: RIGHT FOOT COMPLETE - 3+ VIEW COMPARISON:   04/17/2018 FINDINGS: No acute bony abnormality. Specifically, no fracture, subluxation, or dislocation. No radiographic changes of osteomyelitis. Joint spaces maintained. IMPRESSION: No acute bony abnormality. Electronically Signed   By: Rolm Baptise M.D.   On: 05/09/2018 01:06    EKG: Orders placed or performed in visit on 11/24/14  . EKG 12-Lead    IMPRESSION AND PLAN:  *Diabetic foot infection IV antibiotic for now.  *Peripheral arterial disease due to diabetes Vascular consult, heparin drip for now.  *Diabetes mellitus type 2 Keep on sliding scale coverage for now.  *Hyperlipidemia Continue Lipitor.  *Hypertension Continue home meds.  *Chronic pain with neuropathy She takes high-dose of long-acting and immediate release morphine, continue the same.   All the records are reviewed and case discussed with ED provider. Management plans discussed with the patient, family and they are in agreement.  CODE STATUS: Full code.    TOTAL TIME TAKING CARE OF THIS PATIENT: 50 minutes.  Her daughter was present in the room during my visit.  Vaughan Basta M.D on 05/09/2018   Between 7am to 6pm - Pager - [phone]  After 6pm go to [email or web address] - password [email or web address] [facility]  Office  [phone]  CC: Primary care physician; Richarda Osmond, DO   Note: This dictation was prepared with Dragon dictation along with smaller phrase technology. Any transcriptional errors that result from this process are unintentional.

## 2018-05-09 NOTE — Telephone Encounter (Signed)
Magda Paganini requests a call back from [place].  Did not say why.

## 2018-05-09 NOTE — ED Notes (Signed)
Doppler pulse in inner R ankle. Foot warm. Deep purple area on top of foot. Red around it. Pt states no pain d/t neuropathy.

## 2018-05-09 NOTE — Progress Notes (Signed)
   05/09/18 1100  Clinical Encounter Type  Visited With Patient and family together  Visit Type Initial  Referral From Nurse  Spiritual Encounters  Spiritual Needs Brochure   OR received to update or complete an AD for the patient. The patient was interested in learning more and desires to complete it at some point. She will be discharged today and will ask her nurse to page the on-call chaplain when/if ready to complete.

## 2018-05-09 NOTE — Progress Notes (Signed)
ANTICOAGULATION CONSULT NOTE - Initial Consult  Pharmacy Consult for heparin drip Indication: peripheral arterial occlusion  Allergies  Allergen Reactions  . Codeine     confusion  . Vancomycin     Red Man's syndrome  . Nucynta [Tapentadol] Other (See Comments)    headaches    Patient Measurements: Height: 6' (182.9 cm) Weight: 275 lb (124.7 kg) IBW/kg (Calculated) : 73.1 Heparin Dosing Weight: 94 kg  Vital Signs: Temp: 98.4 F (36.9 C) (02/13 2329) Temp Source: Oral (02/13 2329) BP: 127/74 (02/14 0200) Pulse Rate: 103 (02/14 0200)  Labs: Recent Labs    05/08/18 2346  HGB 15.0  HCT 46.9*  PLT 317  CREATININE 0.56    Estimated Creatinine Clearance: 124.4 mL/min (by C-G formula based on SCr of 0.56 mg/dL).   Medical History: Past Medical History:  Diagnosis Date  . DEPRESSION/ANXIETY 08/08/2009  . Diabetes mellitus without complication ([place])   . IBS 02/02/2010  . Neuropathy   . Polyneuropathy 12/04/2010  . Renal stones   . 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) 08/08/2009    Medications:  Scheduled:  . heparin  4,000 Units Intravenous Once  . insulin aspart  0-5 Units Subcutaneous QHS  . insulin aspart  0-9 Units Subcutaneous TID WC  . morphine  30 mg Oral Q12H    Assessment: Patient admitted for wound check on right foot w/ possible diminished blood flow; however Xray of foot showed no acute abnormalities. Patient is being started empirically on heparin drip for possible arterial occlusion. Patient not on any PTA anticoagulation.  Goal of Therapy:  Heparin level 0.3-0.7 units/ml Monitor platelets by anticoagulation protocol: Yes   Plan:  Will bolus w/ heparin 4000 units IV x 1 Will start rate at 1100 units/hr  Baseline labs drawn still pending; baseline CBC WNL. Will check anti-Xa @ 0900 Will monitor daily CBC's and adjust per anti-Xa levels.  Tobie Lords, PharmD,  BCPS Clinical Pharmacist 05/09/2018

## 2018-05-10 LAB — HIV ANTIBODY (ROUTINE TESTING W REFLEX): HIV Screen 4th Generation wRfx: NONREACTIVE

## 2018-05-13 NOTE — Care Management (Signed)
Attempted to contact patient for EMMI call, unable to reach, left a VM for a call back

## 2018-05-14 LAB — CULTURE, BLOOD (ROUTINE X 2)
Culture: NO GROWTH
Culture: NO GROWTH
Special Requests: ADEQUATE

## 2018-05-16 ENCOUNTER — Inpatient Hospital Stay (HOSPITAL_COMMUNITY): Admission: RE | Admit: 2018-05-16 | Payer: Medicaid Other | Source: Ambulatory Visit

## 2018-05-20 ENCOUNTER — Other Ambulatory Visit: Payer: Self-pay

## 2018-05-20 ENCOUNTER — Encounter: Payer: Medicaid Other | Attending: Physical Medicine & Rehabilitation | Admitting: Registered Nurse

## 2018-05-20 ENCOUNTER — Ambulatory Visit (HOSPITAL_COMMUNITY): Payer: Medicaid Other

## 2018-05-20 ENCOUNTER — Encounter: Payer: Self-pay | Admitting: Registered Nurse

## 2018-05-20 VITALS — BP 136/81 | HR 99 | Ht 72.0 in | Wt 278.8 lb

## 2018-05-20 DIAGNOSIS — G629 Polyneuropathy, unspecified: Secondary | ICD-10-CM | POA: Diagnosis not present

## 2018-05-20 DIAGNOSIS — Z79891 Long term (current) use of opiate analgesic: Secondary | ICD-10-CM | POA: Diagnosis not present

## 2018-05-20 DIAGNOSIS — F341 Dysthymic disorder: Secondary | ICD-10-CM | POA: Diagnosis not present

## 2018-05-20 DIAGNOSIS — E1140 Type 2 diabetes mellitus with diabetic neuropathy, unspecified: Secondary | ICD-10-CM

## 2018-05-20 DIAGNOSIS — G894 Chronic pain syndrome: Secondary | ICD-10-CM

## 2018-05-20 DIAGNOSIS — M25561 Pain in right knee: Secondary | ICD-10-CM | POA: Diagnosis not present

## 2018-05-20 DIAGNOSIS — M62838 Other muscle spasm: Secondary | ICD-10-CM

## 2018-05-20 DIAGNOSIS — Z5181 Encounter for therapeutic drug level monitoring: Secondary | ICD-10-CM

## 2018-05-20 DIAGNOSIS — Z79899 Other long term (current) drug therapy: Secondary | ICD-10-CM | POA: Insufficient documentation

## 2018-05-20 DIAGNOSIS — M792 Neuralgia and neuritis, unspecified: Secondary | ICD-10-CM

## 2018-05-20 DIAGNOSIS — M25562 Pain in left knee: Secondary | ICD-10-CM | POA: Diagnosis not present

## 2018-05-20 MED ORDER — MORPHINE SULFATE 30 MG PO TABS: ORAL_TABLET | ORAL | 0 refills | Status: DC

## 2018-05-20 MED ORDER — MORPHINE SULFATE ER 30 MG PO TBCR: 30.0000 mg | EXTENDED_RELEASE_TABLET | Freq: Two times a day (BID) | ORAL | 0 refills | Status: DC

## 2018-05-20 NOTE — Progress Notes (Signed)
Subjective:    Patient ID: [name], female    DOB: [birth year], 48 y.o.   MRN: [id number]  HPI: [name] is a 48 y.o. female who returns for follow up appointment for chronic pain and medication refill. She states her pain is located in her bilateral hands and bilateral feet with tingling and numbness. She rates her pain 6. Her current exercise regime is walking.  [name] Morphine equivalent is 120.00 MME. She is also prescribed Clonazepam.We have discussed the black box warning of using opioids and benzodiazepines. I highlighted the dangers of using these drugs together and discussed the adverse events including respiratory suppression, overdose, cognitive impairment and importance of compliance with current regimen. We will continue to monitor and adjust as indicated.   Last UDS was Performed on 11/20/2017, it was consistent.  Pain Inventory Average Pain 6 Pain Right Now 6 My pain is intermittent, constant, sharp, burning, dull, stabbing, tingling and aching  In the last 24 hours, has pain interfered with the following? General activity 10 Relation with others 8 Enjoyment of life 10 What TIME of day is your pain at its worst? morning and night Sleep (in general) Fair  Pain is worse with: walking, bending, standing and some activites Pain improves with: rest and medication Relief from Meds: 6  Mobility walk without assistance walk with assistance use a cane how many minutes can you walk? 10 ability to climb steps?  no do you drive?  no Do you have any goals in this area?  yes  Function disabled: date disabled na I need assistance with the following:  meal prep, household duties and shopping  Neuro/Psych weakness numbness tingling trouble walking spasms dizziness depression anxiety  Prior Studies Any changes since last visit?  no  Physicians involved in your care Any changes since last visit?  no   Family History  Problem  Relation Age of Onset  . Fibromyalgia Mother   . Aortic aneurysm Mother   . Stroke Maternal Grandmother   . Colon cancer Maternal Grandmother   . Heart Problems Paternal Grandmother   . Dementia Paternal Grandmother   . Heart attack Paternal Grandfather    Social History   Socioeconomic History  . Marital status: Legally Separated    Spouse name: Not on file  . Number of children: 1  . Years of education: Not on file  . Highest education level: Bachelor's degree (e.g., BA, AB, BS)  Occupational History  . Occupation: Disabled  Social Needs  . Financial resource strain: Not very hard  . Food insecurity:    Worry: Never true    Inability: Never true  . Transportation needs:    Medical: No    Non-medical: No  Tobacco Use  . Smoking status: Never Smoker  . Smokeless tobacco: Never Used  Substance and Sexual Activity  . Alcohol use: No    Alcohol/week: 0.0 standard drinks  . Drug use: No    Comment: rx opiods  . Sexual activity: Yes    Birth control/protection: Condom  Lifestyle  . Physical activity:    Days per week: 0 days    Minutes per session: 0 min  . Stress: Only a little  Relationships  . Social connections:    Talks on phone: Once a week    Gets together: Once a week    Attends religious service: Patient refused    Active member of club or organization: No    Attends meetings of clubs or organizations:  Never    Relationship status: Separated  Other Topics Concern  . Not on file  Social History Narrative  . Not on file   Past Surgical History:  Procedure Laterality Date  . ABDOMINOPLASTY    . CHOLECYSTECTOMY     Past Medical History:  Diagnosis Date  . DEPRESSION/ANXIETY 08/08/2009  . Diabetes mellitus without complication ([place])   . IBS 02/02/2010  . Neuropathy   . Polyneuropathy 12/04/2010  . Renal stones   . 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) 08/08/2009   BP 136/81    Pulse 99   Ht 6' (1.829 m)   Wt 278 lb 12.8 oz (126.5 kg)   LMP 05/06/2018   SpO2 98%   BMI 37.81 kg/m   Opioid Risk Score:   Fall Risk Score:  `1  Depression screen PHQ 2/9  Depression screen [facility] 2/9 05/20/2018 09/10/2017 07/11/2017 06/18/2017 04/24/2017 03/22/2017 01/25/2017  Decreased Interest 1 1 0 1 3 3 3   Down, Depressed, Hopeless 1 1 0 1 3 3 3   PHQ - 2 Score 2 2 0 2 6 6 6   Altered sleeping - - - - - - -  Tired, decreased energy - - - - - - -  Change in appetite - - - - - - -  Feeling bad or failure about yourself  - - - - - - -  Trouble concentrating - - - - - - -  Moving slowly or fidgety/restless - - - - - - -  Suicidal thoughts - - - - - - -  PHQ-9 Score - - - - - - -  Difficult doing work/chores - - - - - - -  Some recent data might be hidden   Review of Systems  Constitutional: Positive for appetite change and diaphoresis.  HENT: Negative.   Eyes: Negative.   Respiratory: Negative.   Cardiovascular: Negative.   Gastrointestinal: Positive for abdominal pain, constipation, diarrhea and nausea.  Endocrine: Negative.   Genitourinary: Negative.   Musculoskeletal: Negative.   Skin: Negative.   Allergic/Immunologic: Negative.   Neurological: Negative.   Hematological: Negative.   Psychiatric/Behavioral: Negative.   All other systems reviewed and are negative.      Objective:   Physical Exam Vitals signs and nursing note reviewed.  Constitutional:      Appearance: Normal appearance.  Neck:     Musculoskeletal: Normal range of motion and neck supple.  Cardiovascular:     Rate and Rhythm: Normal rate and regular rhythm.     Pulses: Normal pulses.     Heart sounds: Normal heart sounds.  Pulmonary:     Effort: Pulmonary effort is normal.     Breath sounds: Normal breath sounds.  Musculoskeletal:     Comments: Normal Muscle Bulk and Muscle Testing Reveals:  Upper Extremities: Full ROM and Muscle Strength 5/5  Lower Extremities: Full ROM and Muscle Strength  5/5 Arises from Table slowly using cane for support Wearing Right  Post-Op Shoe  Narrow Based Gait   Skin:    General: Skin is warm and dry.  Neurological:     Mental Status: She is alert and oriented to person, place, and time.  Psychiatric:        Mood and Affect: Mood normal.        Behavior: Behavior normal.           Assessment & Plan:  1.Severe peripheral polyneuropathy of undetermined etiology.:05/20/2018 Refilled: MS Contin30  mg one tablet every 12 hours #60 andcontinueMSIR 30mg  one tablet at HS, may take a tablet during the day as needed #60.  We will continue the opioid monitoring program, this consists of regular clinic visits, examinations, urine drug screen, pill counts as well as use of [place] Controlled Substance Reporting System. 2. Bilateral progressive upper extremity hand pain/ Polyneuropathy.Continue with current medication regime. Tight Glucose Control. Adhere to healthy diet regime.Continue with current medication regimen with:Amitriptyline:05/20/2018 3. Muscle Spasms: Continuecurrent medication regimen withZanaflex. 05/20/2018 4. Depression/Anxiety: Continuecurrent medication regimen withPaxil and Klonopin. 05/20/2018 5. Bilateral Chronic Knee Pain: No complaints Today.Continue HEP as Tolerated and Continue to Monitor. 05/20/2018. 6. Right Diabetic Foot Ulcer: Podiatry Following. She has a wound Center appointment on 05/21/2018 and Vascular appointment on 05/28/2018. 05/20/2018  20 minutes of face to face patient care time was spent during this visit. All questions were encouraged and answered.  F/U in 1 month

## 2018-05-21 ENCOUNTER — Encounter (HOSPITAL_BASED_OUTPATIENT_CLINIC_OR_DEPARTMENT_OTHER): Payer: Medicaid Other | Attending: Internal Medicine

## 2018-05-21 DIAGNOSIS — L03031 Cellulitis of right toe: Secondary | ICD-10-CM | POA: Diagnosis not present

## 2018-05-21 DIAGNOSIS — E1151 Type 2 diabetes mellitus with diabetic peripheral angiopathy without gangrene: Secondary | ICD-10-CM | POA: Diagnosis not present

## 2018-05-21 DIAGNOSIS — L97512 Non-pressure chronic ulcer of other part of right foot with fat layer exposed: Secondary | ICD-10-CM | POA: Insufficient documentation

## 2018-05-21 DIAGNOSIS — E11621 Type 2 diabetes mellitus with foot ulcer: Secondary | ICD-10-CM | POA: Insufficient documentation

## 2018-05-21 DIAGNOSIS — I10 Essential (primary) hypertension: Secondary | ICD-10-CM | POA: Insufficient documentation

## 2018-05-21 DIAGNOSIS — L97522 Non-pressure chronic ulcer of other part of left foot with fat layer exposed: Secondary | ICD-10-CM | POA: Insufficient documentation

## 2018-05-21 DIAGNOSIS — L03115 Cellulitis of right lower limb: Secondary | ICD-10-CM | POA: Diagnosis not present

## 2018-05-21 DIAGNOSIS — L03032 Cellulitis of left toe: Secondary | ICD-10-CM | POA: Diagnosis not present

## 2018-05-23 NOTE — Progress Notes (Signed)
Erroneous encounter, please disregard

## 2018-05-26 ENCOUNTER — Other Ambulatory Visit: Payer: Self-pay

## 2018-05-26 MED ORDER — PAROXETINE HCL 40 MG PO TABS: 40.0000 mg | ORAL_TABLET | Freq: Every day | ORAL | 1 refills | Status: DC

## 2018-05-28 ENCOUNTER — Ambulatory Visit (HOSPITAL_COMMUNITY)
Admission: RE | Admit: 2018-05-28 | Discharge: 2018-05-28 | Disposition: A | Discharge status: Home / Self Care | Payer: Medicaid Other | Source: Ambulatory Visit | Attending: Cardiovascular Disease | Admitting: Cardiovascular Disease

## 2018-05-28 DIAGNOSIS — L03119 Cellulitis of unspecified part of limb: Secondary | ICD-10-CM | POA: Diagnosis not present

## 2018-05-28 DIAGNOSIS — R600 Localized edema: Secondary | ICD-10-CM | POA: Insufficient documentation

## 2018-05-28 DIAGNOSIS — L02619 Cutaneous abscess of unspecified foot: Secondary | ICD-10-CM

## 2018-05-28 DIAGNOSIS — R0989 Other specified symptoms and signs involving the circulatory and respiratory systems: Secondary | ICD-10-CM

## 2018-05-28 DIAGNOSIS — E1142 Type 2 diabetes mellitus with diabetic polyneuropathy: Secondary | ICD-10-CM | POA: Insufficient documentation

## 2018-05-30 ENCOUNTER — Ambulatory Visit: Payer: Medicaid Other | Admitting: Student in an Organized Health Care Education/Training Program

## 2018-06-05 ENCOUNTER — Other Ambulatory Visit: Payer: Self-pay | Admitting: Physical Medicine & Rehabilitation

## 2018-06-05 DIAGNOSIS — F341 Dysthymic disorder: Secondary | ICD-10-CM

## 2018-06-06 ENCOUNTER — Other Ambulatory Visit: Payer: Self-pay | Admitting: *Deleted

## 2018-06-06 MED ORDER — CIPROFLOXACIN HCL 250 MG PO TABS: 250.0000 mg | ORAL_TABLET | Freq: Two times a day (BID) | ORAL | 0 refills | Status: AC

## 2018-06-06 NOTE — Progress Notes (Signed)
[facility] sent a refill request for Cipro 250 mg.  Dr. Randie Heinz the refill.

## 2018-06-09 NOTE — Telephone Encounter (Signed)
Message from patient

## 2018-06-10 ENCOUNTER — Telehealth: Payer: Self-pay | Admitting: Registered Nurse

## 2018-06-10 MED ORDER — MORPHINE SULFATE ER 30 MG PO TBCR: 30.0000 mg | EXTENDED_RELEASE_TABLET | Freq: Two times a day (BID) | ORAL | 0 refills | Status: DC

## 2018-06-10 MED ORDER — MORPHINE SULFATE 30 MG PO TABS: ORAL_TABLET | ORAL | 0 refills | Status: DC

## 2018-06-10 NOTE — Telephone Encounter (Signed)
Placed a call to Ms. Howley, no answer. Left message regarding her medications, also sent message via My Chart.

## 2018-06-10 NOTE — Telephone Encounter (Signed)
I spoke with Dr. Letta Pate this morning regarding Ms. Bayless concern regarding the Coronavirus. Medications have been e-scribed. My Chart message sent to Ms. Schrade regarding the above.

## 2018-06-18 ENCOUNTER — Ambulatory Visit: Payer: Medicaid Other | Admitting: Registered Nurse

## 2018-07-09 ENCOUNTER — Encounter: Payer: Self-pay | Admitting: Student in an Organized Health Care Education/Training Program

## 2018-07-10 ENCOUNTER — Encounter: Payer: Medicaid Other | Attending: Physical Medicine & Rehabilitation | Admitting: Registered Nurse

## 2018-07-10 ENCOUNTER — Other Ambulatory Visit: Payer: Self-pay

## 2018-07-10 ENCOUNTER — Encounter: Payer: Self-pay | Admitting: Registered Nurse

## 2018-07-10 VITALS — Ht 72.0 in | Wt 278.0 lb

## 2018-07-10 DIAGNOSIS — Z79891 Long term (current) use of opiate analgesic: Secondary | ICD-10-CM

## 2018-07-10 DIAGNOSIS — M25562 Pain in left knee: Secondary | ICD-10-CM | POA: Insufficient documentation

## 2018-07-10 DIAGNOSIS — M792 Neuralgia and neuritis, unspecified: Secondary | ICD-10-CM

## 2018-07-10 DIAGNOSIS — G629 Polyneuropathy, unspecified: Secondary | ICD-10-CM

## 2018-07-10 DIAGNOSIS — Z79899 Other long term (current) drug therapy: Secondary | ICD-10-CM | POA: Insufficient documentation

## 2018-07-10 DIAGNOSIS — E1140 Type 2 diabetes mellitus with diabetic neuropathy, unspecified: Secondary | ICD-10-CM | POA: Diagnosis not present

## 2018-07-10 DIAGNOSIS — M62838 Other muscle spasm: Secondary | ICD-10-CM | POA: Diagnosis not present

## 2018-07-10 DIAGNOSIS — F341 Dysthymic disorder: Secondary | ICD-10-CM | POA: Diagnosis not present

## 2018-07-10 DIAGNOSIS — M25561 Pain in right knee: Secondary | ICD-10-CM | POA: Insufficient documentation

## 2018-07-10 DIAGNOSIS — G894 Chronic pain syndrome: Secondary | ICD-10-CM

## 2018-07-10 DIAGNOSIS — Z5181 Encounter for therapeutic drug level monitoring: Secondary | ICD-10-CM | POA: Insufficient documentation

## 2018-07-10 MED ORDER — MORPHINE SULFATE 30 MG PO TABS: ORAL_TABLET | ORAL | 0 refills | Status: DC

## 2018-07-10 MED ORDER — MORPHINE SULFATE ER 30 MG PO TBCR: 30.0000 mg | EXTENDED_RELEASE_TABLET | Freq: Two times a day (BID) | ORAL | 0 refills | Status: DC

## 2018-07-10 NOTE — Progress Notes (Signed)
Subjective:    Patient ID: [name], female    DOB: 1970/10/13, 48 y.o.   MRN: [id number]  HPI: [name] is a 48 y.o. female her appointment was changed, due to national recommendations of social distancing due to [place] 19, an audio/video telehealth visit is felt to be most appropriate for this patient at this time.  See Chart message from today for the patient's consent to telehealth from [place].     She states her pain is located in her bilateral hands and bilateral feet. She  rates her pain 4. Her current exercise regime is walking.   [name] Morphine equivalent is 120.00 MME.  Last UDS was Performed on 10/31/2017, it was consistent.   Vanessa Overland RN asked the health and History Questions. This provider and [name] verified we were speaking with the correct person using two identifiers.  Pain Inventory Average Pain 6 Pain Right Now 4 My pain is constant, sharp, burning, dull, stabbing, tingling and aching  In the last 24 hours, has pain interfered with the following? General activity 10 Relation with others 8 Enjoyment of life 10 What TIME of day is your pain at its worst? evening Sleep (in general) Poor  Pain is worse with: walking, standing and some activites Pain improves with: medication and massage Relief from Meds: 2  Mobility walk without assistance use a cane ability to climb steps?  yes do you drive?  no  Function disabled: date disabled . I need assistance with the following:  meal prep, household duties and shopping  Neuro/Psych numbness tingling trouble walking spasms dizziness depression anxiety  Prior Studies Any changes since last visit?  yes vascular studies  Physicians involved in your care Any changes since last visit?  no   Family History  Problem Relation Age of Onset  . Fibromyalgia Mother   . Aortic aneurysm Mother   . Stroke Maternal Grandmother   .  Colon cancer Maternal Grandmother   . Heart Problems Paternal Grandmother   . Dementia Paternal Grandmother   . Heart attack Paternal Grandfather    Social History   Socioeconomic History  . Marital status: Legally Separated    Spouse name: Not on file  . Number of children: 1  . Years of education: Not on file  . Highest education level: Bachelor's degree (e.g., BA, AB, BS)  Occupational History  . Occupation: Disabled  Social Needs  . Financial resource strain: Not very hard  . Food insecurity:    Worry: Never true    Inability: Never true  . Transportation needs:    Medical: No    Non-medical: No  Tobacco Use  . Smoking status: Never Smoker  . Smokeless tobacco: Never Used  Substance and Sexual Activity  . Alcohol use: No    Alcohol/week: 0.0 standard drinks  . Drug use: No    Comment: rx opiods  . Sexual activity: Yes    Birth control/protection: Condom  Lifestyle  . Physical activity:    Days per week: 0 days    Minutes per session: 0 min  . Stress: Only a little  Relationships  . Social connections:    Talks on phone: Once a week    Gets together: Once a week    Attends religious service: Patient refused    Active member of club or organization: No    Attends meetings of clubs or organizations: Never    Relationship status: Separated  Other  Topics Concern  . Not on file  Social History Narrative  . Not on file   Past Surgical History:  Procedure Laterality Date  . ABDOMINOPLASTY    . CHOLECYSTECTOMY     Past Medical History:  Diagnosis Date  . DEPRESSION/ANXIETY 08/08/2009  . Diabetes mellitus without complication ([place])   . IBS 02/02/2010  . Neuropathy   . Polyneuropathy 12/04/2010  . Renal stones   . 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) 08/08/2009   Ht 6' (1.829 m)   Wt 278 lb (126.1 kg)   BMI 37.70 kg/m   Opioid Risk Score:   Fall Risk Score:  `1  Depression  screen PHQ 2/9  Depression screen [facility] 2/9 05/20/2018 09/10/2017 07/11/2017 06/18/2017 04/24/2017 03/22/2017 01/25/2017  Decreased Interest 1 1 0 1 3 3 3   Down, Depressed, Hopeless 1 1 0 1 3 3 3   PHQ - 2 Score 2 2 0 2 6 6 6   Altered sleeping - - - - - - -  Tired, decreased energy - - - - - - -  Change in appetite - - - - - - -  Feeling bad or failure about yourself  - - - - - - -  Trouble concentrating - - - - - - -  Moving slowly or fidgety/restless - - - - - - -  Suicidal thoughts - - - - - - -  PHQ-9 Score - - - - - - -  Difficult doing work/chores - - - - - - -  Some recent data might be hidden   Review of Systems  Constitutional: Negative.   HENT: Negative.   Eyes: Negative.   Respiratory: Negative.   Cardiovascular: Negative.   Gastrointestinal: Negative.   Endocrine: Negative.   Genitourinary: Negative.   Musculoskeletal: Positive for gait problem.       Spasms  Skin: Negative.   Allergic/Immunologic: Negative.   Neurological: Positive for numbness.       Tingling  Hematological: Negative.   Psychiatric/Behavioral: Positive for dysphoric mood. The patient is nervous/anxious.   All other systems reviewed and are negative.      Objective:   Physical Exam Vitals signs and nursing note reviewed.  Musculoskeletal:     Comments: No Physical Exam: Virtual Visit  Neurological:     Mental Status: She is oriented to person, place, and time.           Assessment & Plan:  1.Severe peripheral polyneuropathy of undetermined etiology.:07/10/2018 Refilled: MS Contin30 mg one tablet every 12 hours #60 andcontinueMSIR 30mg  one tablet at HS, may take a tablet during the day as needed #60.  We will continue the opioid monitoring program, this consists of regular clinic visits, examinations, urine drug screen, pill counts as well as use of [place] Controlled Substance Reporting System. 2. Bilateral progressive upper extremity hand pain/ Polyneuropathy.Continue with  current medication regime. Tight Glucose Control. Adhere to healthy diet regime.Continue with current medication regimen with:Amitriptyline:07/10/2018 3. Muscle Spasms: Continuecurrent medication regimen withZanaflex. 07/10/2018 4. Depression/Anxiety: Continuecurrent medication regimen withPaxil and Klonopin. 07/10/2018 5. Bilateral Chronic Knee Pain: No complaints Today.Continue HEP as Tolerated and Continue to Monitor. 07/10/2018. 6. Right Diabetic Foot Ulcer: Podiatry Following. Wound Center Following on and Vascular. 07/10/2018  F/U in 1 month  [place] Location of patient: In her Home Location of provider: Office Established patient Time spent on call: 15 Minutes

## 2018-07-22 ENCOUNTER — Other Ambulatory Visit: Payer: Self-pay

## 2018-07-22 DIAGNOSIS — E119 Type 2 diabetes mellitus without complications: Secondary | ICD-10-CM

## 2018-07-22 NOTE — Telephone Encounter (Signed)
Pt called nurse line stating she has a video apt on Thursday, however will run out of Victoza before then. Pt would like this sent to her pharmacy in the next day or so. Pt plans to keep video apt.

## 2018-07-24 ENCOUNTER — Encounter: Payer: Self-pay | Admitting: Family Medicine

## 2018-07-24 ENCOUNTER — Other Ambulatory Visit: Payer: Self-pay

## 2018-07-24 ENCOUNTER — Telehealth (INDEPENDENT_AMBULATORY_CARE_PROVIDER_SITE_OTHER): Payer: Medicaid Other | Admitting: Family Medicine

## 2018-07-24 ENCOUNTER — Telehealth: Payer: Medicaid Other

## 2018-07-24 DIAGNOSIS — E1169 Type 2 diabetes mellitus with other specified complication: Secondary | ICD-10-CM

## 2018-07-24 DIAGNOSIS — E781 Pure hyperglyceridemia: Secondary | ICD-10-CM

## 2018-07-24 DIAGNOSIS — R109 Unspecified abdominal pain: Secondary | ICD-10-CM

## 2018-07-24 NOTE — Progress Notes (Signed)
[place] Telemedicine Visit  Patient consented to have virtual visit. Method of visit: Video  Encounter participants: Patient: [name] - located at Home Provider: Andrena Mews - located at Office Others (if applicable): NA  Chief Complaint: Flank pain and follow-up  HPI:  Back Pain  This is a new (Left flank pain for 3 weeks) problem. The current episode started 1 to 4 weeks ago (3 weeks). The problem occurs constantly (gets better with tylenol). The problem has been gradually worsening since onset. Pain location: Left flank pain. The quality of the pain is described as aching. The pain does not radiate. The pain is at a severity of 6/10. The pain is moderate. The pain is the same all the time. The symptoms are aggravated by standing (Touch). Pertinent negatives include no abdominal pain, bladder incontinence, bowel incontinence, dysuria or fever. (+Urine hesitancy> She has chronic neuropathy, which has not changed) Risk factors: Denies recent fall or trauma to her back. She has tried muscle relaxant (Tylenol, stretching her back) for the symptoms. The treatment provided moderate relief.  DM2:Compliant with her meds. Asked about lab work. Prefers drive through J2E check. QAS:[id number] with her meds, no concern.   ROS: per HPI  Pertinent PMHx: Problem list reviewed.  Exam:  HEENT:EOMI, no facial asymmetry.  Neuro: Awake and alert, oriented x 3. No slurring of her speech. Respiratory: Not in distress  Assessment/Plan:  Flank pain: ?? MSK vs Pyelonephritis. She has chronic pain on multiple medications. Pyelo is less likely given no fever and no dysuria, although she has hesitancy. As discussed with her, if she had Pyelo, IV A/B is preferred over oral. I also recommended office visit tomorrow for Urine culture and evaluation. She need UA as well to r/o kidney stones which she had in the past. She stated that she prefers to come in next  week. Since this has been ongoing for more than 3 weeks and she does not have a fever, I still doubt Pyelo. PCP appointment made for next Thursday. I did advise her to call to come in sooner or go to the ED if symptoms worsen from baseline or she starts to spike a fever. She verbalized understanding and agreed with plan. Continue pain regimen as well as muscle relaxant as needed.  DM2: Due for A1C. We don't have drive through lab. She agreed to come in for lab work.  HLD: Compliant with meds. Return for FLP next Thursday.   Time spent during visit with patient: 21 minutes

## 2018-07-25 ENCOUNTER — Telehealth: Payer: Self-pay | Admitting: Family Medicine

## 2018-07-25 MED ORDER — LIRAGLUTIDE 18 MG/3ML ~~LOC~~ SOPN: PEN_INJECTOR | SUBCUTANEOUS | 0 refills | Status: DC

## 2018-07-25 NOTE — Telephone Encounter (Signed)
HIPAA compliant message left.   Wanted to check on how she is doing and if we need to move her appointment up. She had requested PCP f/u which was scheduled on May 7th.   She knows to call us if she needed to be seen soon.

## 2018-07-30 ENCOUNTER — Encounter: Payer: Self-pay | Admitting: Student in an Organized Health Care Education/Training Program

## 2018-07-31 ENCOUNTER — Ambulatory Visit: Payer: Medicaid Other | Admitting: Student in an Organized Health Care Education/Training Program

## 2018-08-05 ENCOUNTER — Ambulatory Visit: Payer: Medicaid Other | Admitting: Family Medicine

## 2018-08-14 ENCOUNTER — Encounter: Payer: Self-pay | Admitting: Registered Nurse

## 2018-08-14 ENCOUNTER — Telehealth: Payer: Self-pay

## 2018-08-14 ENCOUNTER — Encounter: Payer: Medicaid Other | Attending: Physical Medicine & Rehabilitation | Admitting: Registered Nurse

## 2018-08-14 ENCOUNTER — Other Ambulatory Visit: Payer: Self-pay

## 2018-08-14 VITALS — Ht 72.0 in | Wt 275.0 lb

## 2018-08-14 DIAGNOSIS — Z79891 Long term (current) use of opiate analgesic: Secondary | ICD-10-CM | POA: Diagnosis not present

## 2018-08-14 DIAGNOSIS — F341 Dysthymic disorder: Secondary | ICD-10-CM | POA: Diagnosis not present

## 2018-08-14 DIAGNOSIS — E1140 Type 2 diabetes mellitus with diabetic neuropathy, unspecified: Secondary | ICD-10-CM | POA: Diagnosis not present

## 2018-08-14 DIAGNOSIS — G894 Chronic pain syndrome: Secondary | ICD-10-CM | POA: Diagnosis not present

## 2018-08-14 DIAGNOSIS — M792 Neuralgia and neuritis, unspecified: Secondary | ICD-10-CM

## 2018-08-14 DIAGNOSIS — M62838 Other muscle spasm: Secondary | ICD-10-CM | POA: Diagnosis not present

## 2018-08-14 DIAGNOSIS — M545 Low back pain, unspecified: Secondary | ICD-10-CM

## 2018-08-14 DIAGNOSIS — G629 Polyneuropathy, unspecified: Secondary | ICD-10-CM

## 2018-08-14 DIAGNOSIS — M25561 Pain in right knee: Secondary | ICD-10-CM | POA: Insufficient documentation

## 2018-08-14 DIAGNOSIS — Z5181 Encounter for therapeutic drug level monitoring: Secondary | ICD-10-CM | POA: Diagnosis not present

## 2018-08-14 DIAGNOSIS — M25562 Pain in left knee: Secondary | ICD-10-CM | POA: Insufficient documentation

## 2018-08-14 DIAGNOSIS — Z79899 Other long term (current) drug therapy: Secondary | ICD-10-CM | POA: Insufficient documentation

## 2018-08-14 MED ORDER — MORPHINE SULFATE 30 MG PO TABS: ORAL_TABLET | ORAL | 0 refills | Status: DC

## 2018-08-14 MED ORDER — MORPHINE SULFATE ER 30 MG PO TBCR: 30.0000 mg | EXTENDED_RELEASE_TABLET | Freq: Two times a day (BID) | ORAL | 0 refills | Status: DC

## 2018-08-14 MED ORDER — CLONAZEPAM 0.5 MG PO TABS: ORAL_TABLET | ORAL | 2 refills | Status: DC

## 2018-08-14 NOTE — Progress Notes (Signed)
Subjective:    Patient ID: [name], female    DOB: 1970/06/09, 48 y.o.   MRN: [id number]  HPI: [name] is a 48 y.o. female her appointment was changed, due to national recommendations of social distancing due to [place] 19, an audio/video telehealth visit is felt to be most appropriate for this patient at this time.  See Chart message from today for the patient's consent to telehealth from [place].     She states her pain is located in her bilateral hands, lower back, bilateral lower extremities and bilateral feet with tingling and numbness. She rates her pain 8. Her  current exercise regime is walking.   Ms. Leverett Morphine equivalent is 120.00 MME. She  is also prescribed Clonazepam. We have discussed the black box warning of using opioids and benzodiazepines. I highlighted the dangers of using these drugs together and discussed the adverse events including respiratory suppression, overdose, cognitive impairment and importance of compliance with current regimen. We will continue to monitor and adjust as indicated.   Marland Mcalpine CMA asked the Health and History questions. This provider and Marland Mcalpine verified we were speaking with the correct person using two identifiers.   Pain Inventory Average Pain 7 Pain Right Now 8 My pain is constant, sharp, burning, dull, stabbing, tingling and aching  In the last 24 hours, has pain interfered with the following? General activity 7 Relation with others 7 Enjoyment of life 7 What TIME of day is your pain at its worst? night Sleep (in general) Poor  Pain is worse with: walking, bending, standing and some activites Pain improves with: rest, therapy/exercise and medication Relief from Meds: 7  Mobility walk without assistance walk with assistance use a cane ability to climb steps?  no do you drive?  yes  Function disabled: date disabled 2007  Neuro/Psych numbness  tingling trouble walking spasms dizziness depression anxiety  Prior Studies Any changes since last visit?  no  Physicians involved in your care Any changes since last visit?  no   Family History  Problem Relation Age of Onset  . Fibromyalgia Mother   . Aortic aneurysm Mother   . Stroke Maternal Grandmother   . Colon cancer Maternal Grandmother   . Heart Problems Paternal Grandmother   . Dementia Paternal Grandmother   . Heart attack Paternal Grandfather    Social History   Socioeconomic History  . Marital status: Legally Separated    Spouse name: Not on file  . Number of children: 1  . Years of education: Not on file  . Highest education level: Bachelor's degree (e.g., BA, AB, BS)  Occupational History  . Occupation: Disabled  Social Needs  . Financial resource strain: Not very hard  . Food insecurity:    Worry: Never true    Inability: Never true  . Transportation needs:    Medical: No    Non-medical: No  Tobacco Use  . Smoking status: Never Smoker  . Smokeless tobacco: Never Used  Substance and Sexual Activity  . Alcohol use: No    Alcohol/week: 0.0 standard drinks  . Drug use: No    Comment: rx opiods  . Sexual activity: Yes    Birth control/protection: Condom  Lifestyle  . Physical activity:    Days per week: 0 days    Minutes per session: 0 min  . Stress: Only a little  Relationships  . Social connections:    Talks on phone: Once a week  Gets together: Once a week    Attends religious service: Patient refused    Active member of club or organization: No    Attends meetings of clubs or organizations: Never    Relationship status: Separated  Other Topics Concern  . Not on file  Social History Narrative  . Not on file   Past Surgical History:  Procedure Laterality Date  . ABDOMINOPLASTY    . CHOLECYSTECTOMY     Past Medical History:  Diagnosis Date  . DEPRESSION/ANXIETY 08/08/2009  . Diabetes mellitus without complication ([place])   .  IBS 02/02/2010  . Neuropathy   . Polyneuropathy 12/04/2010  . Renal stones   . 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  Opioid Risk Score:   Fall Risk Score:  `1  Depression screen PHQ 2/9  Depression screen [facility] 2/9 05/20/2018 09/10/2017 07/11/2017 06/18/2017 04/24/2017 03/22/2017 01/25/2017  Decreased Interest 1 1 0 1 3 3 3   Down, Depressed, Hopeless 1 1 0 1 3 3 3   PHQ - 2 Score 2 2 0 2 6 6 6   Altered sleeping - - - - - - -  Tired, decreased energy - - - - - - -  Change in appetite - - - - - - -  Feeling bad or failure about yourself  - - - - - - -  Trouble concentrating - - - - - - -  Moving slowly or fidgety/restless - - - - - - -  Suicidal thoughts - - - - - - -  PHQ-9 Score - - - - - - -  Difficult doing work/chores - - - - - - -  Some recent data might be hidden     Review of Systems  Constitutional: Negative.   HENT: Negative.   Eyes: Negative.   Respiratory: Negative.   Cardiovascular: Negative.   Gastrointestinal: Negative.   Endocrine: Negative.   Genitourinary: Negative.   Musculoskeletal: Positive for arthralgias, back pain, gait problem and myalgias.  Skin: Negative.   Allergic/Immunologic: Negative.   Neurological: Positive for dizziness and numbness.  Hematological: Negative.   Psychiatric/Behavioral: Positive for dysphoric mood. The patient is nervous/anxious.   All other systems reviewed and are negative.      Objective:   Physical Exam Vitals signs and nursing note reviewed.  Musculoskeletal:     Comments: No Physical Exam Performed: Virtual Visit  Neurological:     Mental Status: She is oriented to person, place, and time.           Assessment & Plan:  1.Severe peripheral polyneuropathy of undetermined etiology.:08/14/2018 Refilled: MS Contin30 mg one tablet every 12 hours #60 andcontinueMSIR 30mg  one  tablet at HS, may take a tablet during the day as needed #60.  We will continue the opioid monitoring program, this consists of regular clinic visits, examinations, urine drug screen, pill counts as well as use of [place] Controlled Substance Reporting System. 2. Bilateral progressive upper extremity hand pain/ Polyneuropathy.Continue with current medication regime. Tight Glucose Control. Adhere to healthy diet regime.Continue with current medication regimen with:Amitriptyline:08/14/2018 3. Muscle Spasms: Continuecurrent medication regimen withZanaflex. 08/14/2018 4. Depression/Anxiety: Continuecurrent medication regimen withPaxil and Klonopin. 08/14/2018 5. Bilateral Chronic Knee Pain: No complaints Today.Continue HEP as Tolerated and Continue to Monitor. 08/14/2018. 6. Right Diabetic Foot Ulcer: Podiatry Following. Wound Center Following on and Vascular. 08/14/2018 7. Acute Left-sided Low Back Pain: Has an  appointment with PCP ? UTI she reports. We will continue to monitor. Continue current medication regimen. Continue to monitor/  F/U in 1 month  Telephone Call  Location of patient: In her Home Location of provider: Office Established patient Time spent on call: 10 Minutes

## 2018-08-14 NOTE — Telephone Encounter (Signed)
Pharmacist from [facility] called in regards to this patient.  Stated that while filling her medications he accidentally hit a wrong button and accidentally deleted the Morphine IR out of the system and has asked if we cab resubmit it.

## 2018-08-15 ENCOUNTER — Other Ambulatory Visit: Payer: Self-pay | Admitting: Physical Medicine & Rehabilitation

## 2018-08-26 ENCOUNTER — Other Ambulatory Visit: Payer: Self-pay | Admitting: Internal Medicine

## 2018-08-26 ENCOUNTER — Other Ambulatory Visit: Payer: Self-pay | Admitting: Student in an Organized Health Care Education/Training Program

## 2018-08-26 DIAGNOSIS — E119 Type 2 diabetes mellitus without complications: Secondary | ICD-10-CM

## 2018-09-03 ENCOUNTER — Ambulatory Visit: Payer: Medicaid Other | Admitting: Student in an Organized Health Care Education/Training Program

## 2018-09-16 ENCOUNTER — Other Ambulatory Visit: Payer: Self-pay

## 2018-09-16 ENCOUNTER — Encounter: Payer: Medicaid Other | Attending: Physical Medicine & Rehabilitation | Admitting: Registered Nurse

## 2018-09-16 ENCOUNTER — Encounter: Payer: Self-pay | Admitting: Registered Nurse

## 2018-09-16 VITALS — BP 144/78 | HR 106 | Temp 98.7°F | Ht 72.0 in

## 2018-09-16 DIAGNOSIS — E1140 Type 2 diabetes mellitus with diabetic neuropathy, unspecified: Secondary | ICD-10-CM | POA: Diagnosis not present

## 2018-09-16 DIAGNOSIS — Z79891 Long term (current) use of opiate analgesic: Secondary | ICD-10-CM | POA: Diagnosis not present

## 2018-09-16 DIAGNOSIS — Z79899 Other long term (current) drug therapy: Secondary | ICD-10-CM | POA: Diagnosis not present

## 2018-09-16 DIAGNOSIS — M545 Low back pain, unspecified: Secondary | ICD-10-CM

## 2018-09-16 DIAGNOSIS — G894 Chronic pain syndrome: Secondary | ICD-10-CM | POA: Diagnosis not present

## 2018-09-16 DIAGNOSIS — M25561 Pain in right knee: Secondary | ICD-10-CM | POA: Insufficient documentation

## 2018-09-16 DIAGNOSIS — F341 Dysthymic disorder: Secondary | ICD-10-CM

## 2018-09-16 DIAGNOSIS — Z5181 Encounter for therapeutic drug level monitoring: Secondary | ICD-10-CM

## 2018-09-16 DIAGNOSIS — M62838 Other muscle spasm: Secondary | ICD-10-CM

## 2018-09-16 DIAGNOSIS — G629 Polyneuropathy, unspecified: Secondary | ICD-10-CM | POA: Diagnosis not present

## 2018-09-16 DIAGNOSIS — G8929 Other chronic pain: Secondary | ICD-10-CM

## 2018-09-16 DIAGNOSIS — M25562 Pain in left knee: Secondary | ICD-10-CM | POA: Diagnosis not present

## 2018-09-16 MED ORDER — MORPHINE SULFATE 30 MG PO TABS: ORAL_TABLET | ORAL | 0 refills | Status: DC

## 2018-09-16 MED ORDER — MORPHINE SULFATE ER 30 MG PO TBCR: 30.0000 mg | EXTENDED_RELEASE_TABLET | Freq: Two times a day (BID) | ORAL | 0 refills | Status: DC

## 2018-09-16 NOTE — Progress Notes (Signed)
Subjective:    Patient ID: [name], female    DOB: 02/24/1971, 48 y.o.   MRN: [id number]  HPI: [name] is a 48 y.o. female who returns for follow up appointment for chronic pain and medication refill. She states her pain is located in her bilateral hands, lower back pain mainly left side and bilateral feet pain. She rates her pain 8. Her current exercise regime is walking.  [name] Morphine equivalent is 120.00 MME.She is also prescribed clonazepam. We have discussed the black box warning of using opioids and benzodiazepines. I highlighted the dangers of using these drugs together and discussed the adverse events including respiratory suppression, overdose, cognitive impairment and importance of compliance with current regimen. We will continue to monitor and adjust as indicated.   Last UDS was Performed on 11/20/2017, it was consistent.    Pain Inventory Average Pain 6 Pain Right Now 8 My pain is constant, sharp, burning, dull, stabbing, tingling and aching  In the last 24 hours, has pain interfered with the following? General activity 8 Relation with others 8 Enjoyment of life 8 What TIME of day is your pain at its worst? morning,evening Sleep (in general) n/a  Pain is worse with: n/a Pain improves with: n/a Relief from Meds: n/a  Mobility walk without assistance walk with assistance use a cane ability to climb steps?  no do you drive?  no Do you have any goals in this area?  yes  Function disabled: date disabled n/a Do you have any goals in this area?  yes  Neuro/Psych weakness numbness tremor tingling trouble walking spasms dizziness depression anxiety  Prior Studies n/a  Physicians involved in your care n/a   Family History  Problem Relation Age of Onset  . Fibromyalgia Mother   . Aortic aneurysm Mother   . Stroke Maternal Grandmother   . Colon cancer Maternal Grandmother   . Heart Problems Paternal Grandmother   .  Dementia Paternal Grandmother   . Heart attack Paternal Grandfather    Social History   Socioeconomic History  . Marital status: Legally Separated    Spouse name: Not on file  . Number of children: 1  . Years of education: Not on file  . Highest education level: Bachelor's degree (e.g., BA, AB, BS)  Occupational History  . Occupation: Disabled  Social Needs  . Financial resource strain: Not very hard  . Food insecurity    Worry: Never true    Inability: Never true  . Transportation needs    Medical: No    Non-medical: No  Tobacco Use  . Smoking status: Never Smoker  . Smokeless tobacco: Never Used  Substance and Sexual Activity  . Alcohol use: No    Alcohol/week: 0.0 standard drinks  . Drug use: No    Comment: rx opiods  . Sexual activity: Yes    Birth control/protection: Condom  Lifestyle  . Physical activity    Days per week: 0 days    Minutes per session: 0 min  . Stress: Only a little  Relationships  . Social Herbalist on phone: Once a week    Gets together: Once a week    Attends religious service: Patient refused    Active member of club or organization: No    Attends meetings of clubs or organizations: Never    Relationship status: Separated  Other Topics Concern  . Not on file  Social History Narrative  . Not on file  Past Surgical History:  Procedure Laterality Date  . ABDOMINOPLASTY    . CHOLECYSTECTOMY     Past Medical History:  Diagnosis Date  . DEPRESSION/ANXIETY 08/08/2009  . Diabetes mellitus without complication ([place])   . IBS 02/02/2010  . Neuropathy   . Polyneuropathy 12/04/2010  . Renal stones   . 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) 08/08/2009   BP (!) 159/97   Pulse (!) 106   Temp 98.6 F (37 C)   Ht 6' (1.829 m)   BMI 37.30 kg/m   Opioid Risk Score:   Fall Risk Score:  `1  Depression screen PHQ 2/9  Depression screen [facility] 2/9 05/20/2018  09/10/2017 07/11/2017 06/18/2017 04/24/2017 03/22/2017 01/25/2017  Decreased Interest 1 1 0 1 3 3 3   Down, Depressed, Hopeless 1 1 0 1 3 3 3   PHQ - 2 Score 2 2 0 2 6 6 6   Altered sleeping - - - - - - -  Tired, decreased energy - - - - - - -  Change in appetite - - - - - - -  Feeling bad or failure about yourself  - - - - - - -  Trouble concentrating - - - - - - -  Moving slowly or fidgety/restless - - - - - - -  Suicidal thoughts - - - - - - -  PHQ-9 Score - - - - - - -  Difficult doing work/chores - - - - - - -  Some recent data might be hidden     Review of Systems     Objective:   Physical Exam Vitals signs and nursing note reviewed.  Constitutional:      Appearance: Normal appearance. She is obese.  Neck:     Musculoskeletal: Normal range of motion and neck supple.  Cardiovascular:     Rate and Rhythm: Normal rate and regular rhythm.     Pulses: Normal pulses.     Heart sounds: Normal heart sounds.  Pulmonary:     Effort: Pulmonary effort is normal.     Breath sounds: Normal breath sounds.  Musculoskeletal:     Comments: Normal Muscle Bulk and Muscle Testing Reveals:  Upper Extremities: Full  ROM and Muscle Strength 5/5  Lumbar Paraspinal Tenderness: L-4-L-5 Lower Extremities: Full ROM and Muscle Strength 5/5 Arises from Table with ease using cane for support Narrow Based  Gait   Skin:    General: Skin is warm and dry.  Neurological:     Mental Status: She is alert and oriented to person, place, and time.  Psychiatric:        Mood and Affect: Mood normal.        Behavior: Behavior normal.           Assessment & Plan:  1.Severe peripheral polyneuropathy of undetermined etiology.:09/16/2018 Refilled: MS Contin30 mg one tablet every 12 hours #60 andcontinueMSIR 30mg  one tablet at HS, may take a tablet during the day as needed #60.  We will continue the opioid monitoring program, this consists of regular clinic visits, examinations, urine drug screen, pill  counts as well as use of [place] Controlled Substance Reporting System. 2. Bilateral progressive upper extremity hand pain/ Polyneuropathy.Continue with current medication regime. Tight Glucose Control. Adhere to healthy diet regime.Continue with current medication regimen with:Amitriptyline:09/16/2018 3. Muscle Spasms: Continuecurrent medication regimen withZanaflex. 09/16/2018 4. Depression/Anxiety: Continuecurrent medication regimen withPaxil and Klonopin. 09/16/2018 5. Bilateral Chronic Knee Pain: No complaints Today.Continue  HEP as Tolerated and Continue to Monitor. 09/16/2018. 6. Right Diabetic Foot Ulcer: Podiatry Following. Wound CenterFollowingon and Vascular. 09/16/2018 7. Left-sided Low Back Pain: She will F/U with her with PCP. Continue current medication regimen. Continue to monitor.  F/U in 1 month

## 2018-09-23 ENCOUNTER — Other Ambulatory Visit: Payer: Self-pay

## 2018-09-23 ENCOUNTER — Ambulatory Visit: Payer: Medicaid Other | Admitting: Student in an Organized Health Care Education/Training Program

## 2018-09-23 VITALS — BP 124/80 | HR 114

## 2018-09-23 DIAGNOSIS — E1165 Type 2 diabetes mellitus with hyperglycemia: Secondary | ICD-10-CM

## 2018-09-23 DIAGNOSIS — E781 Pure hyperglyceridemia: Secondary | ICD-10-CM | POA: Diagnosis not present

## 2018-09-23 DIAGNOSIS — R2681 Unsteadiness on feet: Secondary | ICD-10-CM | POA: Diagnosis not present

## 2018-09-23 DIAGNOSIS — Z0000 Encounter for general adult medical examination without abnormal findings: Secondary | ICD-10-CM | POA: Diagnosis not present

## 2018-09-23 LAB — POCT GLYCOSYLATED HEMOGLOBIN (HGB A1C): HbA1c, POC (controlled diabetic range): 7.5 % — AB (ref 0.0–7.0)

## 2018-09-23 NOTE — Progress Notes (Signed)
   Subjective:    Patient ID: [name], female    DOB: [birth year], 48 y.o.   MRN: [id number]   CC: patient would like her "annual labs" drawn  HPI: Patient complains of chronic pain in lower back.  She has no other specific complaints at this time. She would like her annual labs but doesn't have a specific complaint. She just had CBC and CMP a few months ago while being seen for a procedure.  She is very upset that I don't agree with running them again today.  As I am wrapping up the visit, she mentions that she would like to talk about her chronic dizziness and ringing in her ears. She has had it for many years. Her brother has had similar symptoms in the past. He was negative for menieres dz. She denies hearing loss. I asked patient to make another appointment to talk about this so we can go more in depth.   Smoking status reviewed   ROS: pertinent noted in the HPI   Past medical history, surgical, family, and social history reviewed and updated in the EMR as appropriate.  Objective:  BP 124/80   Pulse (!) 114   SpO2 100%   Vitals and nursing note reviewed  General: NAD, pleasant, able to participate in exam Cardiac: RRR, S1 S2 present. normal heart sounds, no murmurs. Respiratory: CTAB, normal effort, No wheezes, rales or rhonchi Extremities: no edema or cyanosis. Skin: warm and dry, no rashes noted MSK: patient has pain to palpation over lateral lumbar areas. Negative straight leg raise test.  Neuro: alert, no obvious focal deficits Psych: Normal affect and mood   Assessment & Plan:    Hypertriglyceridemia Triglycerides 337 at this visit. Did not tolerate statin in past. Was prescribed a statin in 11/2017 Recommended patient increase physical activity  Wellness examination hgb A1c ordered 7.5 (from 8.6) Referral to physical therapy for back pain. Return for soon follow up to discuss dizziness Due for pap smear- declined today     Doristine Mango, [facility] PGY-1

## 2018-09-23 NOTE — Patient Instructions (Addendum)
It was a pleasure to see you today!  To summarize our discussion for this visit:  Your A1c was 7.5 today. That's great! Keep up the good work  We are going to check your lipid panel today. I can release those results on mychart.   We are going to physical therapy.   Some additional health maintenance measures we should update are: . Another appointment to talk about dizziness and ringing in ears   Please return to our clinic to see me in 3 months for follow up.  Call the clinic at [phone] if your symptoms worsen or you have any concerns.  Thank you for allowing me to take part in your care,  Dr. Doristine Mango   Thanks for choosing [facility] for your primary care.

## 2018-09-24 LAB — LIPID PANEL
Chol/HDL Ratio: 3 ratio (ref 0.0–4.4)
Cholesterol, Total: 107 mg/dL (ref 100–199)
HDL: 36 mg/dL — ABNORMAL LOW (ref >39)
LDL Calculated: 4 mg/dL (ref 0–99)
Triglycerides: 337 mg/dL — ABNORMAL HIGH (ref 0–149)
VLDL Cholesterol Cal: 67 mg/dL — ABNORMAL HIGH (ref 5–40)

## 2018-09-29 DIAGNOSIS — Z0000 Encounter for general adult medical examination without abnormal findings: Secondary | ICD-10-CM | POA: Insufficient documentation

## 2018-09-29 NOTE — Assessment & Plan Note (Addendum)
Triglycerides 337 at this visit. Did not tolerate statin in past. Was prescribed a statin in 11/2017 Recommended patient increase physical activity

## 2018-09-29 NOTE — Assessment & Plan Note (Addendum)
hgb A1c ordered 7.5 (from 8.6) Referral to physical therapy for back pain. Return for soon follow up to discuss dizziness Due for pap smear- declined today

## 2018-10-07 ENCOUNTER — Telehealth: Payer: Self-pay | Admitting: Registered Nurse

## 2018-10-07 NOTE — Telephone Encounter (Signed)
Placed a call to Ms. Puccio she is taking the Nortriptyline. Nortriptyline re-ordered, she verbalizes understanding.

## 2018-10-07 NOTE — Telephone Encounter (Signed)
Recieved electronic medication refill request for nortriptyline medication for this patient.  No mention on any notes about this medication other then to "stop taking trazodone"  Unsure if ok to refill this medication.  Please advise.

## 2018-10-08 ENCOUNTER — Encounter: Payer: Medicaid Other | Attending: Physical Medicine & Rehabilitation | Admitting: Registered Nurse

## 2018-10-08 DIAGNOSIS — Z5181 Encounter for therapeutic drug level monitoring: Secondary | ICD-10-CM | POA: Insufficient documentation

## 2018-10-08 DIAGNOSIS — M25562 Pain in left knee: Secondary | ICD-10-CM | POA: Insufficient documentation

## 2018-10-08 DIAGNOSIS — Z79899 Other long term (current) drug therapy: Secondary | ICD-10-CM | POA: Insufficient documentation

## 2018-10-08 DIAGNOSIS — M25561 Pain in right knee: Secondary | ICD-10-CM | POA: Insufficient documentation

## 2018-10-08 DIAGNOSIS — G894 Chronic pain syndrome: Secondary | ICD-10-CM | POA: Insufficient documentation

## 2018-10-13 ENCOUNTER — Encounter: Payer: Medicaid Other | Admitting: Registered Nurse

## 2018-10-13 ENCOUNTER — Other Ambulatory Visit: Payer: Self-pay

## 2018-10-13 ENCOUNTER — Encounter: Payer: Self-pay | Admitting: Registered Nurse

## 2018-10-13 VITALS — BP 130/70 | HR 90 | Temp 96.6°F | Ht 72.0 in | Wt 283.8 lb

## 2018-10-13 DIAGNOSIS — G8929 Other chronic pain: Secondary | ICD-10-CM

## 2018-10-13 DIAGNOSIS — G629 Polyneuropathy, unspecified: Secondary | ICD-10-CM | POA: Diagnosis not present

## 2018-10-13 DIAGNOSIS — F341 Dysthymic disorder: Secondary | ICD-10-CM

## 2018-10-13 DIAGNOSIS — M545 Low back pain, unspecified: Secondary | ICD-10-CM

## 2018-10-13 DIAGNOSIS — G894 Chronic pain syndrome: Secondary | ICD-10-CM | POA: Diagnosis not present

## 2018-10-13 DIAGNOSIS — M25561 Pain in right knee: Secondary | ICD-10-CM | POA: Diagnosis not present

## 2018-10-13 DIAGNOSIS — E1140 Type 2 diabetes mellitus with diabetic neuropathy, unspecified: Secondary | ICD-10-CM

## 2018-10-13 DIAGNOSIS — Z79891 Long term (current) use of opiate analgesic: Secondary | ICD-10-CM

## 2018-10-13 DIAGNOSIS — M62838 Other muscle spasm: Secondary | ICD-10-CM

## 2018-10-13 DIAGNOSIS — Z79899 Other long term (current) drug therapy: Secondary | ICD-10-CM | POA: Diagnosis not present

## 2018-10-13 DIAGNOSIS — M25562 Pain in left knee: Secondary | ICD-10-CM | POA: Diagnosis not present

## 2018-10-13 DIAGNOSIS — Z5181 Encounter for therapeutic drug level monitoring: Secondary | ICD-10-CM | POA: Diagnosis not present

## 2018-10-13 MED ORDER — MORPHINE SULFATE 30 MG PO TABS: ORAL_TABLET | ORAL | 0 refills | Status: DC

## 2018-10-13 MED ORDER — MORPHINE SULFATE ER 30 MG PO TBCR: 30.0000 mg | EXTENDED_RELEASE_TABLET | Freq: Two times a day (BID) | ORAL | 0 refills | Status: DC

## 2018-10-13 NOTE — Progress Notes (Signed)
Subjective:    Patient ID: [name], female    DOB: 28-Mar-1970, 48 y.o.   MRN: [id number]  HPI: [name] is a 48 y.o. female who returns for follow up appointment for chronic pain and medication refill. She states her pain is located in her bilateral hands, bilateral lower extremities and bilateral feet with tingling and burning. Also reports lower back pain. She rates her pain 8. Her current exercise regime is walking.   Ms. Douglass Morphine equivalent is 120.00 MME. She  is also prescribed clonazepam . We have discussed the black box warning of using opioids and benzodiazepines. I highlighted the dangers of using these drugs together and discussed the adverse events including respiratory suppression, overdose, cognitive impairment and importance of compliance with current regimen. We will continue to monitor and adjust as indicated.   Oral Swab was Performed today.    Pain Inventory Average Pain 7 Pain Right Now 8 My pain is constant, sharp, burning, dull and stabbing  In the last 24 hours, has pain interfered with the following? General activity 10 Relation with others 10 Enjoyment of life 10 What TIME of day is your pain at its worst? morning and night Sleep (in general) Poor  Pain is worse with: walking, bending, sitting and standing Pain improves with: rest, medication and other Relief from Meds: 6  Mobility walk without assistance walk with assistance use a cane how many minutes can you walk? 5-10 ability to climb steps?  no do you drive?  yes  Function disabled: date disabled . I need assistance with the following:  meal prep, household duties and shopping  Neuro/Psych weakness numbness tremor tingling trouble walking spasms dizziness depression anxiety  Prior Studies Any changes since last visit?  no  Physicians involved in your care Any changes since last visit?  no   Family History  Problem Relation Age of Onset  .  Fibromyalgia Mother   . Aortic aneurysm Mother   . Stroke Maternal Grandmother   . Colon cancer Maternal Grandmother   . Heart Problems Paternal Grandmother   . Dementia Paternal Grandmother   . Heart attack Paternal Grandfather    Social History   Socioeconomic History  . Marital status: Legally Separated    Spouse name: Not on file  . Number of children: 1  . Years of education: Not on file  . Highest education level: Bachelor's degree (e.g., BA, AB, BS)  Occupational History  . Occupation: Disabled  Social Needs  . Financial resource strain: Not very hard  . Food insecurity    Worry: Never true    Inability: Never true  . Transportation needs    Medical: No    Non-medical: No  Tobacco Use  . Smoking status: Never Smoker  . Smokeless tobacco: Never Used  Substance and Sexual Activity  . Alcohol use: No    Alcohol/week: 0.0 standard drinks  . Drug use: No    Comment: rx opiods  . Sexual activity: Yes    Birth control/protection: Condom  Lifestyle  . Physical activity    Days per week: 0 days    Minutes per session: 0 min  . Stress: Only a little  Relationships  . Social Herbalist on phone: Once a week    Gets together: Once a week    Attends religious service: Patient refused    Active member of club or organization: No    Attends meetings of clubs or organizations: Never  Relationship status: Separated  Other Topics Concern  . Not on file  Social History Narrative  . Not on file   Past Surgical History:  Procedure Laterality Date  . ABDOMINOPLASTY    . CHOLECYSTECTOMY     Past Medical History:  Diagnosis Date  . DEPRESSION/ANXIETY 08/08/2009  . Diabetes mellitus without complication ([place])   . IBS 02/02/2010  . Neuropathy   . Polyneuropathy 12/04/2010  . Renal stones   . 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) 08/08/2009   BP 130/70   Pulse 90   Temp (!) 96.6 F  (35.9 C)   Ht 6' (1.829 m)   Wt 283 lb 12.8 oz (128.7 kg)   SpO2 98%   BMI 38.49 kg/m   Opioid Risk Score:   Fall Risk Score:  `1  Depression screen PHQ 2/9  Depression screen [facility] 2/9 10/13/2018 05/20/2018 09/10/2017 07/11/2017 06/18/2017 04/24/2017 03/22/2017  Decreased Interest 2 1 1  0 1 3 3   Down, Depressed, Hopeless 2 1 1  0 1 3 3   PHQ - 2 Score 4 2 2  0 2 6 6   Altered sleeping - - - - - - -  Tired, decreased energy - - - - - - -  Change in appetite - - - - - - -  Feeling bad or failure about yourself  - - - - - - -  Trouble concentrating - - - - - - -  Moving slowly or fidgety/restless - - - - - - -  Suicidal thoughts - - - - - - -  PHQ-9 Score - - - - - - -  Difficult doing work/chores - - - - - - -  Some recent data might be hidden    Review of Systems  Constitutional: Positive for diaphoresis.  Eyes: Negative.   Respiratory: Negative.   Cardiovascular: Negative.   Gastrointestinal: Positive for abdominal pain, constipation and diarrhea.  Endocrine: Negative.   Genitourinary: Positive for difficulty urinating.  Musculoskeletal: Positive for back pain and gait problem.       Spasms  Skin: Negative.   Allergic/Immunologic: Negative.   Neurological: Positive for tremors, weakness and numbness.       Tingling  Hematological: Negative.   Psychiatric/Behavioral: Positive for dysphoric mood. The patient is nervous/anxious.   All other systems reviewed and are negative.      Objective:   Physical Exam Vitals signs and nursing note reviewed.  Constitutional:      Appearance: Normal appearance. She is obese.  Neck:     Musculoskeletal: Normal range of motion and neck supple.  Cardiovascular:     Rate and Rhythm: Normal rate and regular rhythm.     Pulses: Normal pulses.     Heart sounds: Normal heart sounds.  Pulmonary:     Effort: Pulmonary effort is normal.     Breath sounds: Normal breath sounds.  Musculoskeletal:     Comments: Normal Muscle Bulk and Muscle  Testing Reveals:  Upper Extremities: Full ROM and Muscle Strength 5/5 , Lumbar Paraspinal Tenderness: L-4-L-5 Lower Extremities: Full ROM and Muscle Strength 4/5 Arises from Table Slowly Antalgic  Gait   Skin:    General: Skin is warm and dry.  Neurological:     Mental Status: She is alert and oriented to person, place, and time.  Psychiatric:        Mood and Affect: Mood normal.        Behavior: Behavior normal.  Assessment & Plan:  1.Severe peripheral polyneuropathy of undetermined etiology.:10/13/2018 Refilled: MS Contin30 mg one tablet every 12 hours #60 andcontinueMSIR 30mg  one tablet at HS, may take a tablet during the day as needed #60.  We will continue the opioid monitoring program, this consists of regular clinic visits, examinations, urine drug screen, pill counts as well as use of [place] Controlled Substance Reporting System. 2. Bilateral progressive upper extremity hand pain/ Polyneuropathy.Continue with current medication regime. Tight Glucose Control. Adhere to healthy diet regime.Continue with current medication regimen with nortriptyline:10/13/2018 3. Muscle Spasms: Continuecurrent medication regimen withZanaflex. 10/13/2018 4. Depression/Anxiety: Continuecurrent medication regimen withPaxil and Klonopin. 10/13/2018 5. Bilateral Chronic Knee Pain: No complaints Today.Continue HEP as Tolerated and Continue to Monitor. 10/13/2018. 6. Right Diabetic Foot Ulcer: Podiatry Following. [facility]Following[facility]. 10/13/2018 7. Left-sided Low Back Pain: Continue current medication regimen. Continue to monitor.10/13/18  F/U in 1 month  20 minutes of face to face patient care time was spent during this visit. All questions were encouraged and answered.

## 2018-10-14 ENCOUNTER — Telehealth: Payer: Self-pay | Admitting: Student in an Organized Health Care Education/Training Program

## 2018-10-14 ENCOUNTER — Telehealth: Payer: Self-pay | Admitting: *Deleted

## 2018-10-14 NOTE — Telephone Encounter (Signed)
Pt is calling and would like to speak to Dr. Tonette Bihari advisor. She has some issues that are concerning her. Please call. [email or web address]

## 2018-10-14 NOTE — Telephone Encounter (Signed)
Prior auth submitted to [facility] TRACKS for Morphine Sulfate ER 30 mg and Morphine Sulfate IR 30 mg.

## 2018-10-15 NOTE — Telephone Encounter (Addendum)
Morphine Sulfate ER 30 mg  Status:APPROVED  Effective Begin Date:10/14/2018  Effective End Date:10/09/2019 Confirmation #:[id number] F Morphine Sulfate IR 30 mg Status: APPROVED Effective Begin Date:07/21/2020Effective End Date:04/12/2019 Confirmation #:[id number] F

## 2018-10-16 ENCOUNTER — Encounter: Payer: Self-pay | Admitting: Family Medicine

## 2018-10-17 ENCOUNTER — Encounter: Payer: Self-pay | Admitting: Family Medicine

## 2018-10-17 ENCOUNTER — Telehealth: Payer: Self-pay | Admitting: Family Medicine

## 2018-10-17 LAB — DRUG TOX MONITOR 1 W/CONF, ORAL FLD
Alprazolam: NEGATIVE ng/mL (ref <0.50)
Amphetamines: NEGATIVE ng/mL (ref <10)
Barbiturates: NEGATIVE ng/mL (ref <10)
Benzodiazepines: POSITIVE ng/mL — AB (ref <0.50)
Buprenorphine: NEGATIVE ng/mL (ref <0.10)
Buprenorphine: NEGATIVE ng/mL (ref <0.10)
Chlordiazepoxide: NEGATIVE ng/mL (ref <0.50)
Clonazepam: 0.61 ng/mL — ABNORMAL HIGH (ref <0.50)
Cocaine: NEGATIVE ng/mL (ref <5.0)
Diazepam: NEGATIVE ng/mL (ref <0.50)
Fentanyl: NEGATIVE ng/mL (ref <0.10)
Fentanyl: NEGATIVE ng/mL (ref <0.10)
Flunitrazepam: NEGATIVE ng/mL (ref <0.50)
Flurazepam: NEGATIVE ng/mL (ref <0.50)
Heroin Metabolite: NEGATIVE ng/mL (ref <1.0)
Lorazepam: NEGATIVE ng/mL (ref <0.50)
MARIJUANA: NEGATIVE ng/mL (ref <2.5)
MDMA: NEGATIVE ng/mL (ref <10)
Meprobamate: NEGATIVE ng/mL (ref <2.5)
Methadone: NEGATIVE ng/mL (ref <5.0)
Midazolam: NEGATIVE ng/mL (ref <0.50)
Naloxone: NEGATIVE ng/mL (ref <0.25)
Nicotine Metabolite: NEGATIVE ng/mL (ref <5.0)
Norbuprenorphine: NEGATIVE ng/mL (ref <0.50)
Nordiazepam: NEGATIVE ng/mL (ref <0.50)
Opiates: NEGATIVE ng/mL (ref <2.5)
Oxazepam: NEGATIVE ng/mL (ref <0.50)
Phencyclidine: NEGATIVE ng/mL (ref <10)
Tapentadol: NEGATIVE ng/mL (ref <5.0)
Temazepam: NEGATIVE ng/mL (ref <0.50)
Tramadol: NEGATIVE ng/mL (ref <5.0)
Triazolam: NEGATIVE ng/mL (ref <0.50)
Zolpidem: NEGATIVE ng/mL (ref <5.0)

## 2018-10-17 LAB — DRUG TOX ALC METAB W/CON, ORAL FLD: Alcohol Metabolite: NEGATIVE ng/mL (ref <25)

## 2018-10-17 NOTE — Telephone Encounter (Signed)
I called patient again with no response.  HIPAA complaint callback message left.  I will talk to her when she calls back.

## 2018-10-17 NOTE — Telephone Encounter (Signed)
HIPAA compliant callback message left.    She emailed me via [place] regarding her last visit with her PCP. She requested for some lab test which was not done.  I have reviewed her chart, and it seems it is appropriate to defer lab for now since she had a recent CBC and Bmet in February i.e 5 months ago.  She however, may recheck her Vit D level since it was previously low and I believe she was started on Vit D  supplement.  I will call her again later today to discuss.

## 2018-10-20 NOTE — Telephone Encounter (Addendum)
Morphine Sulfate IR 30 mg had to be re submitted for high dose opioid and re- approved. I then called and had it back dated so [place] could be reimbursed from the pharmacy. New date is 10/15/18 and exp 04/13/2019. I notified Susanne by [employer].

## 2018-10-21 ENCOUNTER — Telehealth: Payer: Self-pay | Admitting: *Deleted

## 2018-10-21 NOTE — Telephone Encounter (Signed)
Oral swab drug screen was consistent for prescribed medication clonazepam. Her morphine did not show up even though she said she took both the same day as test.

## 2018-10-24 NOTE — Telephone Encounter (Signed)
I discussed case with Dr. Prince Rome and she agreed to switch PCP from her to a different provider.  Dr. Tarry Kos is on red team with [facility]. Hence, it makes sense to switch to her.   Kennyth Lose will work on the switch.

## 2018-11-11 ENCOUNTER — Other Ambulatory Visit: Payer: Self-pay | Admitting: Student in an Organized Health Care Education/Training Program

## 2018-11-18 ENCOUNTER — Telehealth: Payer: Self-pay | Admitting: Registered Nurse

## 2018-11-18 MED ORDER — MORPHINE SULFATE ER 30 MG PO TBCR: 30.0000 mg | EXTENDED_RELEASE_TABLET | Freq: Two times a day (BID) | ORAL | 0 refills | Status: DC

## 2018-11-18 NOTE — Telephone Encounter (Signed)
MS Contin e-scribed. Message sent to Ms. Blecha via My Chart.

## 2018-11-19 ENCOUNTER — Encounter: Payer: Medicaid Other | Admitting: Registered Nurse

## 2018-11-24 ENCOUNTER — Other Ambulatory Visit: Payer: Self-pay | Admitting: Registered Nurse

## 2018-11-24 DIAGNOSIS — F341 Dysthymic disorder: Secondary | ICD-10-CM

## 2018-11-26 ENCOUNTER — Other Ambulatory Visit: Payer: Self-pay

## 2018-11-27 ENCOUNTER — Other Ambulatory Visit: Payer: Self-pay

## 2018-11-27 ENCOUNTER — Encounter: Payer: Medicaid Other | Attending: Physical Medicine & Rehabilitation | Admitting: Registered Nurse

## 2018-11-27 ENCOUNTER — Encounter: Payer: Self-pay | Admitting: Registered Nurse

## 2018-11-27 VITALS — BP 121/82 | HR 115 | Temp 97.6°F | Ht 72.0 in | Wt 285.2 lb

## 2018-11-27 DIAGNOSIS — Z79899 Other long term (current) drug therapy: Secondary | ICD-10-CM | POA: Insufficient documentation

## 2018-11-27 DIAGNOSIS — M25562 Pain in left knee: Secondary | ICD-10-CM | POA: Insufficient documentation

## 2018-11-27 DIAGNOSIS — Z5181 Encounter for therapeutic drug level monitoring: Secondary | ICD-10-CM

## 2018-11-27 DIAGNOSIS — G894 Chronic pain syndrome: Secondary | ICD-10-CM

## 2018-11-27 DIAGNOSIS — Z79891 Long term (current) use of opiate analgesic: Secondary | ICD-10-CM

## 2018-11-27 DIAGNOSIS — M25561 Pain in right knee: Secondary | ICD-10-CM | POA: Diagnosis not present

## 2018-11-27 DIAGNOSIS — M62838 Other muscle spasm: Secondary | ICD-10-CM | POA: Diagnosis not present

## 2018-11-27 DIAGNOSIS — G629 Polyneuropathy, unspecified: Secondary | ICD-10-CM

## 2018-11-27 DIAGNOSIS — M792 Neuralgia and neuritis, unspecified: Secondary | ICD-10-CM | POA: Diagnosis not present

## 2018-11-27 DIAGNOSIS — E1140 Type 2 diabetes mellitus with diabetic neuropathy, unspecified: Secondary | ICD-10-CM | POA: Diagnosis not present

## 2018-11-27 DIAGNOSIS — F341 Dysthymic disorder: Secondary | ICD-10-CM

## 2018-11-27 MED ORDER — CLONAZEPAM 0.5 MG PO TABS: ORAL_TABLET | ORAL | 4 refills | Status: DC

## 2018-11-27 MED ORDER — ATORVASTATIN CALCIUM 80 MG PO TABS: 80.0000 mg | ORAL_TABLET | Freq: Every day | ORAL | 3 refills | Status: DC

## 2018-11-27 MED ORDER — MORPHINE SULFATE 30 MG PO TABS: ORAL_TABLET | ORAL | 0 refills | Status: DC

## 2018-11-27 MED ORDER — MORPHINE SULFATE ER 30 MG PO TBCR: 30.0000 mg | EXTENDED_RELEASE_TABLET | Freq: Two times a day (BID) | ORAL | 0 refills | Status: DC

## 2018-11-27 NOTE — Progress Notes (Signed)
Subjective:    Patient ID: [name], female    DOB: Oct 21, 1970, 48 y.o.   MRN: [id number]  HPI: [name] is a 48 y.o. female who returns for follow up appointment for chronic pain and medication refill. She states her pain is located in her bilateral hands and bilateral feet with tingling and burning. Also reports dental pain, she is in the process scheduling an appointment she states. She rates her  Pain 7. Her current exercise regime is walking and performing stretching exercises.  [name] Morphine equivalent is  120.00  MME.    Pain Inventory Average Pain 7 Pain Right Now 7 My pain is constant, sharp, burning, dull, stabbing, tingling and aching  In the last 24 hours, has pain interfered with the following? General activity 9 Relation with others 9 Enjoyment of life 9 What TIME of day is your pain at its worst? morning and evening  Sleep (in general) Poor  Pain is worse with: walking, bending and standing Pain improves with: rest and medication Relief from Meds: 6  Mobility walk without assistance walk with assistance use a cane how many minutes can you walk? 5-10 ability to climb steps?  no do you drive?  no Do you have any goals in this area?  yes  Function disabled: date disabled . I need assistance with the following:  meal prep, household duties and shopping Do you have any goals in this area?  yes  Neuro/Psych weakness numbness tremor tingling trouble walking spasms dizziness depression anxiety  Prior Studies Any changes since last visit?  no  Physicians involved in your care Any changes since last visit?  no   Family History  Problem Relation Age of Onset  . Fibromyalgia Mother   . Aortic aneurysm Mother   . Stroke Maternal Grandmother   . Colon cancer Maternal Grandmother   . Heart Problems Paternal Grandmother   . Dementia Paternal Grandmother   . Heart attack Paternal Grandfather    Social History    Socioeconomic History  . Marital status: Legally Separated    Spouse name: Not on file  . Number of children: 1  . Years of education: Not on file  . Highest education level: Bachelor's degree (e.g., BA, AB, BS)  Occupational History  . Occupation: Disabled  Social Needs  . Financial resource strain: Not very hard  . Food insecurity    Worry: Never true    Inability: Never true  . Transportation needs    Medical: No    Non-medical: No  Tobacco Use  . Smoking status: Never Smoker  . Smokeless tobacco: Never Used  Substance and Sexual Activity  . Alcohol use: No    Alcohol/week: 0.0 standard drinks  . Drug use: No    Comment: rx opiods  . Sexual activity: Yes    Birth control/protection: Condom  Lifestyle  . Physical activity    Days per week: 0 days    Minutes per session: 0 min  . Stress: Only a little  Relationships  . Social Herbalist on phone: Once a week    Gets together: Once a week    Attends religious service: Patient refused    Active member of club or organization: No    Attends meetings of clubs or organizations: Never    Relationship status: Separated  Other Topics Concern  . Not on file  Social History Narrative  . Not on file   Past Surgical History:  Procedure Laterality Date  . ABDOMINOPLASTY    . CHOLECYSTECTOMY     Past Medical History:  Diagnosis Date  . DEPRESSION/ANXIETY 08/08/2009  . Diabetes mellitus without complication ([place])   . IBS 02/02/2010  . Neuropathy   . Polyneuropathy 12/04/2010  . Renal stones   . 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) 08/08/2009   BP 121/82   Pulse (!) 115   Temp 97.6 F (36.4 C)   Ht 6' (1.829 m)   Wt 285 lb 3.2 oz (129.4 kg)   SpO2 98%   BMI 38.68 kg/m   Opioid Risk Score:   Fall Risk Score:  `1  Depression screen PHQ 2/9  Depression screen [facility] 2/9 10/13/2018 05/20/2018 09/10/2017 07/11/2017 06/18/2017 04/24/2017  03/22/2017  Decreased Interest 2 1 1  0 1 3 3   Down, Depressed, Hopeless 2 1 1  0 1 3 3   PHQ - 2 Score 4 2 2  0 2 6 6   Altered sleeping - - - - - - -  Tired, decreased energy - - - - - - -  Change in appetite - - - - - - -  Feeling bad or failure about yourself  - - - - - - -  Trouble concentrating - - - - - - -  Moving slowly or fidgety/restless - - - - - - -  Suicidal thoughts - - - - - - -  PHQ-9 Score - - - - - - -  Difficult doing work/chores - - - - - - -  Some recent data might be hidden    Review of Systems  Constitutional: Negative.   HENT: Negative.   Eyes: Negative.   Respiratory: Negative.   Cardiovascular: Negative.   Gastrointestinal: Positive for constipation, diarrhea and nausea.  Endocrine: Negative.   Genitourinary: Negative.   Musculoskeletal: Positive for gait problem.  Skin: Negative.   Allergic/Immunologic: Negative.   Neurological: Positive for dizziness, tremors, weakness and numbness.  Hematological: Negative.   Psychiatric/Behavioral: Positive for dysphoric mood. The patient is nervous/anxious.        Objective:   Physical Exam Vitals signs and nursing note reviewed.  Constitutional:      Appearance: Normal appearance.  Neck:     Musculoskeletal: Normal range of motion and neck supple.  Cardiovascular:     Rate and Rhythm: Normal rate and regular rhythm.     Pulses: Normal pulses.     Heart sounds: Normal heart sounds.  Pulmonary:     Effort: Pulmonary effort is normal.     Breath sounds: Normal breath sounds.  Musculoskeletal:     Comments: Normal Muscle Bulk and Muscle Testing Reveals:  Upper Extremities: Full ROM and Muscle Strength 5/5  Lower Extremities: Full ROM and Muscle Strength 5/5 Arises from Table with ease Narrow Based Gait   Skin:    General: Skin is warm and dry.  Neurological:     Mental Status: She is alert and oriented to person, place, and time.  Psychiatric:        Mood and Affect: Mood normal.        Behavior:  Behavior normal.           Assessment & Plan:  1.Severe peripheral polyneuropathy of undetermined etiology.:11/27/2018 Refilled: MS Contin30 mg one tablet every 12 hours #60 andcontinueMSIR 30mg  one tablet at HS, may take a tablet during the day as needed #60.  We will continue the opioid monitoring program, this consists of  regular clinic visits, examinations, urine drug screen, pill counts as well as use of [place] Controlled Substance Reporting System. 2. Bilateral progressive upper extremity hand pain/ Polyneuropathy.Continue with current medication regime. Tight Glucose Control. Adhere to healthy diet regime.Continue with current medication regimen with nortriptyline:11/27/2018 3. Muscle Spasms: Continuecurrent medication regimen withZanaflex. 11/27/2018 4. Depression/Anxiety: Continuecurrent medication regimen withPaxil and Klonopin. 11/27/2018 5. Bilateral Chronic Knee Pain: No complaints Today.Continue HEP as Tolerated and Continue to Monitor. 11/27/2018. 6. Right Diabetic Foot Ulcer: Podiatry Following. Wound CenterFollowingon and Vascular. 10/13/2018 7. Left-sided Low Back Pain:No complaints today. Continue current medication regimen. Continue to monitor.11/27/18  F/U in 1 month  75minutes of face to face patient care time was spent during this visit. All questions were encouraged and answered.

## 2018-12-25 ENCOUNTER — Encounter: Payer: Self-pay | Admitting: Family Medicine

## 2018-12-26 ENCOUNTER — Other Ambulatory Visit: Payer: Self-pay | Admitting: Internal Medicine

## 2018-12-30 ENCOUNTER — Encounter: Payer: Self-pay | Admitting: Family Medicine

## 2019-01-07 ENCOUNTER — Other Ambulatory Visit: Payer: Self-pay | Admitting: Internal Medicine

## 2019-01-07 ENCOUNTER — Telehealth: Payer: Self-pay | Admitting: Registered Nurse

## 2019-01-07 ENCOUNTER — Other Ambulatory Visit: Payer: Self-pay | Admitting: Student in an Organized Health Care Education/Training Program

## 2019-01-07 DIAGNOSIS — E119 Type 2 diabetes mellitus without complications: Secondary | ICD-10-CM

## 2019-01-07 MED ORDER — MORPHINE SULFATE 30 MG PO TABS: ORAL_TABLET | ORAL | 0 refills | Status: DC

## 2019-01-07 NOTE — Telephone Encounter (Signed)
PMP was reviewed, MSIR- e-scribed. Ms. Teigland appoint ment was changed , she is awre of the above.

## 2019-01-07 NOTE — Telephone Encounter (Signed)
Talked to [name] over the phone and she informed me that she has a fever of 101. She thinks it is because of a tooth infection from when she saw the dentist on Monday. Her medication is fixing to run out tomorrow (Morphine IR). Rescheduled for next Thursday at 3 pm

## 2019-01-08 ENCOUNTER — Encounter: Payer: Medicaid Other | Admitting: Registered Nurse

## 2019-01-15 ENCOUNTER — Encounter: Payer: Medicaid Other | Attending: Physical Medicine & Rehabilitation | Admitting: Registered Nurse

## 2019-01-15 ENCOUNTER — Other Ambulatory Visit: Payer: Self-pay

## 2019-01-15 ENCOUNTER — Encounter: Payer: Self-pay | Admitting: Registered Nurse

## 2019-01-15 VITALS — BP 107/74 | HR 100 | Temp 99.8°F | Ht 72.0 in | Wt 285.0 lb

## 2019-01-15 DIAGNOSIS — M792 Neuralgia and neuritis, unspecified: Secondary | ICD-10-CM

## 2019-01-15 DIAGNOSIS — M25562 Pain in left knee: Secondary | ICD-10-CM | POA: Insufficient documentation

## 2019-01-15 DIAGNOSIS — Z79899 Other long term (current) drug therapy: Secondary | ICD-10-CM | POA: Diagnosis not present

## 2019-01-15 DIAGNOSIS — E1140 Type 2 diabetes mellitus with diabetic neuropathy, unspecified: Secondary | ICD-10-CM

## 2019-01-15 DIAGNOSIS — Z79891 Long term (current) use of opiate analgesic: Secondary | ICD-10-CM

## 2019-01-15 DIAGNOSIS — G894 Chronic pain syndrome: Secondary | ICD-10-CM | POA: Diagnosis not present

## 2019-01-15 DIAGNOSIS — Z5181 Encounter for therapeutic drug level monitoring: Secondary | ICD-10-CM | POA: Diagnosis not present

## 2019-01-15 DIAGNOSIS — M62838 Other muscle spasm: Secondary | ICD-10-CM | POA: Diagnosis not present

## 2019-01-15 DIAGNOSIS — F341 Dysthymic disorder: Secondary | ICD-10-CM

## 2019-01-15 DIAGNOSIS — M25561 Pain in right knee: Secondary | ICD-10-CM | POA: Diagnosis not present

## 2019-01-15 DIAGNOSIS — G629 Polyneuropathy, unspecified: Secondary | ICD-10-CM

## 2019-01-15 MED ORDER — MORPHINE SULFATE ER 30 MG PO TBCR: 30.0000 mg | EXTENDED_RELEASE_TABLET | Freq: Two times a day (BID) | ORAL | 0 refills | Status: DC

## 2019-01-15 MED ORDER — MORPHINE SULFATE 30 MG PO TABS: ORAL_TABLET | ORAL | 0 refills | Status: DC

## 2019-01-15 NOTE — Progress Notes (Signed)
Subjective:    Patient ID: [name], female    DOB: November 10, 1970, 48 y.o.   MRN: [id number]  HPI: [name] is a 48 y.o. female who returns for follow up appointment for chronic pain and medication refill. She states her pain is located in her bilateral hands and feet with tingling and burning. Also reports she has generalized pain all over. She rates her  Pain 5. Her  current exercise regime is walking.   [name] temperature was 99.8 today, her temperature was rechecked 99.2, she reports she had dental work last week and on antibiotics. She was instructed to keep close observation on her temperature and follow up with her PCP, she verbalizes understanding.    [name] Morphine equivalent is  120.00 MME.She is also prescribed Clonazepam. We have discussed the black box warning of using opioids and benzodiazepines. I highlighted the dangers of using these drugs together and discussed the adverse events including respiratory suppression, overdose, cognitive impairment and importance of compliance with current regimen. We will continue to monitor and adjust as indicated.   Last Oral Swab was Performed on 10/13/2018, see note for details.   Pain Inventory Average Pain 7 Pain Right Now 5 My pain is constant, sharp, burning, dull, stabbing, tingling and aching  In the last 24 hours, has pain interfered with the following? General activity 9 Relation with others 9 Enjoyment of life 9 What TIME of day is your pain at its worst? . Sleep (in general) Poor  Pain is worse with: walking, bending, standing and some activites Pain improves with: rest and medication Relief from Meds: 7  Mobility walk without assistance use a cane ability to climb steps?  no do you drive?  no  Function disabled: date disabled .  Neuro/Psych weakness numbness tremor tingling trouble walking spasms dizziness depression anxiety  Prior Studies Any changes since last  visit?  no  Physicians involved in your care Any changes since last visit?  no   Family History  Problem Relation Age of Onset  . Fibromyalgia Mother   . Aortic aneurysm Mother   . Stroke Maternal Grandmother   . Colon cancer Maternal Grandmother   . Heart Problems Paternal Grandmother   . Dementia Paternal Grandmother   . Heart attack Paternal Grandfather    Social History   Socioeconomic History  . Marital status: Legally Separated    Spouse name: Not on file  . Number of children: 1  . Years of education: Not on file  . Highest education level: Bachelor's degree (e.g., BA, AB, BS)  Occupational History  . Occupation: Disabled  Social Needs  . Financial resource strain: Not very hard  . Food insecurity    Worry: Never true    Inability: Never true  . Transportation needs    Medical: No    Non-medical: No  Tobacco Use  . Smoking status: Never Smoker  . Smokeless tobacco: Never Used  Substance and Sexual Activity  . Alcohol use: No    Alcohol/week: 0.0 standard drinks  . Drug use: No    Comment: rx opiods  . Sexual activity: Yes    Birth control/protection: Condom  Lifestyle  . Physical activity    Days per week: 0 days    Minutes per session: 0 min  . Stress: Only a little  Relationships  . Social connections    Talks on phone: Once a week    Gets together: Once a week    Attends  religious service: Patient refused    Active member of club or organization: No    Attends meetings of clubs or organizations: Never    Relationship status: Separated  Other Topics Concern  . Not on file  Social History Narrative  . Not on file   Past Surgical History:  Procedure Laterality Date  . ABDOMINOPLASTY    . CHOLECYSTECTOMY     Past Medical History:  Diagnosis Date  . DEPRESSION/ANXIETY 08/08/2009  . Diabetes mellitus without complication ([name])   . IBS 02/02/2010  . Neuropathy   . Polyneuropathy 12/04/2010  . Renal stones   . Type II or unspecified type  diabetes mellitus with neurological manifestations, 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) 08/08/2009   BP 107/74   Pulse 100   Temp 99.8 F (37.7 C)   Ht 6' (1.829 m)   Wt 285 lb (129.3 kg)   SpO2 98%   BMI 38.65 kg/m   Opioid Risk Score:   Fall Risk Score:  `1  Depression screen PHQ 2/9  Depression screen [facility] 2/9 10/13/2018 05/20/2018 09/10/2017 07/11/2017 06/18/2017 04/24/2017 03/22/2017  Decreased Interest 2 1 1  0 1 3 3   Down, Depressed, Hopeless 2 1 1  0 1 3 3   PHQ - 2 Score 4 2 2  0 2 6 6   Altered sleeping - - - - - - -  Tired, decreased energy - - - - - - -  Change in appetite - - - - - - -  Feeling bad or failure about yourself  - - - - - - -  Trouble concentrating - - - - - - -  Moving slowly or fidgety/restless - - - - - - -  Suicidal thoughts - - - - - - -  PHQ-9 Score - - - - - - -  Difficult doing work/chores - - - - - - -  Some recent data might be hidden     Review of Systems  Constitutional: Negative.   HENT: Negative.   Eyes: Negative.   Respiratory: Negative.   Cardiovascular: Negative.   Gastrointestinal: Negative.   Endocrine: Negative.   Genitourinary: Negative.   Musculoskeletal: Positive for gait problem and myalgias.  Skin: Negative.   Allergic/Immunologic: Negative.   Neurological: Positive for dizziness, tremors, weakness and numbness.  Hematological: Negative.   Psychiatric/Behavioral: Positive for dysphoric mood. The patient is nervous/anxious.   All other systems reviewed and are negative.      Objective:   Physical Exam Vitals signs and nursing note reviewed.  Constitutional:      Appearance: Normal appearance. She is obese.  Neck:     Musculoskeletal: Normal range of motion and neck supple.  Cardiovascular:     Rate and Rhythm: Normal rate and regular rhythm.     Pulses: Normal pulses.     Heart sounds: Normal heart sounds.  Pulmonary:     Effort: Pulmonary effort is normal.     Breath sounds: Normal breath sounds.  Musculoskeletal:      Comments: Normal Muscle Bulk and Muscle Testing Reveals:  Upper Extremities: Full ROM and Muscle Strength 5/5 r Lower Extremities: Full ROM and Muscle Strength 5/5 Arises from Table with ease Narrow Based  Gait   Skin:    General: Skin is warm and dry.  Neurological:     Mental Status: She is alert and oriented to person, place, and time.  Psychiatric:        Mood and Affect: Mood normal.  Behavior: Behavior normal.           Assessment & Plan:  1.Severe peripheral polyneuropathy of undetermined etiology.:01/15/2019 Refilled: MS Contin30 mg one tablet every 12 hours #60 andcontinueMSIR 30mg  one tablet at HS, may take a tablet during the day as needed #60.  We will continue the opioid monitoring program, this consists of regular clinic visits, examinations, urine drug screen, pill counts as well as use of [place] Controlled Substance Reporting System. 2. Bilateral progressive upper extremity hand pain/ Polyneuropathy.Continue with current medication regime. Tight Glucose Control. Adhere to healthy diet regime.Continue with current medication regimen withnortriptyline:01/15/2019 3. Muscle Spasms: Continuecurrent medication regimen withZanaflex. 01/15/2019 4. Depression/Anxiety: Continuecurrent medication regimen withPaxil and Klonopin. 01/15/2019 5. Bilateral Chronic Knee Pain: No complaints Today.Continue HEP as Tolerated and Continue to Monitor. 01/15/2019. 6. Right Diabetic Foot Ulcer: Podiatry Following[facility]. 01/15/2019 7. Left-sided Low Back Pain:No complaints today. Continue current medication regimen. Continue to monitor.01/15/19  F/U in 1 month  53minutes of face to face patient care time was spent during this visit. All questions were encouraged and answered.

## 2019-01-23 ENCOUNTER — Other Ambulatory Visit: Payer: Self-pay | Admitting: Registered Nurse

## 2019-01-27 ENCOUNTER — Other Ambulatory Visit: Payer: Self-pay | Admitting: Registered Nurse

## 2019-02-02 ENCOUNTER — Other Ambulatory Visit: Payer: Self-pay | Admitting: *Deleted

## 2019-02-02 MED ORDER — NORTRIPTYLINE HCL 50 MG PO CAPS: 50.0000 mg | ORAL_CAPSULE | Freq: Every day | ORAL | 1 refills | Status: DC

## 2019-02-05 ENCOUNTER — Telehealth: Payer: Self-pay | Admitting: Registered Nurse

## 2019-02-05 NOTE — Telephone Encounter (Signed)
[name] sent a My-chart message requesting MSIR refill. Prescription was already sent to pharmacy.

## 2019-02-09 ENCOUNTER — Encounter: Payer: Medicaid Other | Attending: Physical Medicine & Rehabilitation | Admitting: Registered Nurse

## 2019-02-09 ENCOUNTER — Encounter: Payer: Self-pay | Admitting: Registered Nurse

## 2019-02-09 ENCOUNTER — Other Ambulatory Visit: Payer: Self-pay

## 2019-02-09 VITALS — BP 146/87 | HR 103 | Temp 98.1°F | Ht 72.0 in | Wt 285.0 lb

## 2019-02-09 DIAGNOSIS — G894 Chronic pain syndrome: Secondary | ICD-10-CM | POA: Insufficient documentation

## 2019-02-09 DIAGNOSIS — M25561 Pain in right knee: Secondary | ICD-10-CM | POA: Diagnosis not present

## 2019-02-09 DIAGNOSIS — Z5181 Encounter for therapeutic drug level monitoring: Secondary | ICD-10-CM | POA: Diagnosis not present

## 2019-02-09 DIAGNOSIS — M25562 Pain in left knee: Secondary | ICD-10-CM | POA: Diagnosis not present

## 2019-02-09 DIAGNOSIS — G629 Polyneuropathy, unspecified: Secondary | ICD-10-CM

## 2019-02-09 DIAGNOSIS — Z79899 Other long term (current) drug therapy: Secondary | ICD-10-CM | POA: Diagnosis not present

## 2019-02-09 DIAGNOSIS — F341 Dysthymic disorder: Secondary | ICD-10-CM

## 2019-02-09 DIAGNOSIS — E1140 Type 2 diabetes mellitus with diabetic neuropathy, unspecified: Secondary | ICD-10-CM | POA: Diagnosis not present

## 2019-02-09 DIAGNOSIS — M62838 Other muscle spasm: Secondary | ICD-10-CM | POA: Diagnosis not present

## 2019-02-09 DIAGNOSIS — Z79891 Long term (current) use of opiate analgesic: Secondary | ICD-10-CM

## 2019-02-09 MED ORDER — MORPHINE SULFATE 30 MG PO TABS: ORAL_TABLET | ORAL | 0 refills | Status: DC

## 2019-02-09 MED ORDER — MORPHINE SULFATE ER 30 MG PO TBCR: 30.0000 mg | EXTENDED_RELEASE_TABLET | Freq: Two times a day (BID) | ORAL | 0 refills | Status: DC

## 2019-02-09 NOTE — Progress Notes (Signed)
Subjective:    Patient ID: [name], female    DOB: October 30, 1970, 48 y.o.   MRN: [id number]  HPI: [name] is a 48 y.o. female who returns for follow up appointment for chronic pain and medication refill. She states her  pain is located in her bilateral hands and bilateral feet with tingling and burning. She rates her pain 7. Her current exercise regime is walking, she was encouraged to increase HEP as tolerated, she verbalizes understanding.   [name] Morphine equivalent is 120.00  MME. She is also prescribed Clonazepam. We have discussed the black box warning of using opioids and benzodiazepines. I highlighted the dangers of using these drugs together and discussed the adverse events including respiratory suppression, overdose, cognitive impairment and importance of compliance with current regimen. We will continue to monitor and adjust as indicated.   UDS ordered today.   Pain Inventory Average Pain 7 Pain Right Now 7 My pain is constant, sharp, burning, dull, stabbing, tingling and aching  In the last 24 hours, has pain interfered with the following? General activity 9 Relation with others 9 Enjoyment of life 9 What TIME of day is your pain at its worst? all Sleep (in general) Poor  Pain is worse with: walking, standing and some activites Pain improves with: medication Relief from Meds: 7  Mobility walk without assistance walk with assistance use a cane how many minutes can you walk? 5 ability to climb steps?  no do you drive?  no Do you have any goals in this area?  yes  Function disabled: date disabled . Do you have any goals in this area?  yes  Neuro/Psych No problems in this area  Prior Studies Any changes since last visit?  no  Physicians involved in your care Any changes since last visit?  no   Family History  Problem Relation Age of Onset  . Fibromyalgia Mother   . Aortic aneurysm Mother   . Stroke Maternal Grandmother   .  Colon cancer Maternal Grandmother   . Heart Problems Paternal Grandmother   . Dementia Paternal Grandmother   . Heart attack Paternal Grandfather    Social History   Socioeconomic History  . Marital status: Legally Separated    Spouse name: Not on file  . Number of children: 1  . Years of education: Not on file  . Highest education level: Bachelor's degree (e.g., BA, AB, BS)  Occupational History  . Occupation: Disabled  Social Needs  . Financial resource strain: Not very hard  . Food insecurity    Worry: Never true    Inability: Never true  . Transportation needs    Medical: No    Non-medical: No  Tobacco Use  . Smoking status: Never Smoker  . Smokeless tobacco: Never Used  Substance and Sexual Activity  . Alcohol use: No    Alcohol/week: 0.0 standard drinks  . Drug use: No    Comment: rx opiods  . Sexual activity: Yes    Birth control/protection: Condom  Lifestyle  . Physical activity    Days per week: 0 days    Minutes per session: 0 min  . Stress: Only a little  Relationships  . Social Herbalist on phone: Once a week    Gets together: Once a week    Attends religious service: Patient refused    Active member of club or organization: No    Attends meetings of clubs or organizations: Never  Relationship status: Separated  Other Topics Concern  . Not on file  Social History Narrative  . Not on file   Past Surgical History:  Procedure Laterality Date  . ABDOMINOPLASTY    . CHOLECYSTECTOMY     Past Medical History:  Diagnosis Date  . DEPRESSION/ANXIETY 08/08/2009  . Diabetes mellitus without complication ([place])   . IBS 02/02/2010  . Neuropathy   . Polyneuropathy 12/04/2010  . Renal stones   . 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) 08/08/2009   Temp 98.1 F (36.7 C)   Ht 6' (1.829 m)   Wt 285 lb (129.3 kg)   BMI 38.65 kg/m   Opioid Risk Score:   Fall Risk Score:   `1  Depression screen PHQ 2/9  Depression screen [facility] 2/9 10/13/2018 05/20/2018 09/10/2017 07/11/2017 06/18/2017 04/24/2017 03/22/2017  Decreased Interest 2 1 1  0 1 3 3   Down, Depressed, Hopeless 2 1 1  0 1 3 3   PHQ - 2 Score 4 2 2  0 2 6 6   Altered sleeping - - - - - - -  Tired, decreased energy - - - - - - -  Change in appetite - - - - - - -  Feeling bad or failure about yourself  - - - - - - -  Trouble concentrating - - - - - - -  Moving slowly or fidgety/restless - - - - - - -  Suicidal thoughts - - - - - - -  PHQ-9 Score - - - - - - -  Difficult doing work/chores - - - - - - -  Some recent data might be hidden    Review of Systems  Constitutional: Negative.   HENT: Negative.   Eyes: Negative.   Respiratory: Negative.   Cardiovascular: Negative.   Gastrointestinal: Negative.   Endocrine: Negative.   Genitourinary: Negative.   Musculoskeletal: Negative.   Skin: Negative.   Allergic/Immunologic: Negative.   Neurological: Negative.   Hematological: Negative.   Psychiatric/Behavioral: Negative.   All other systems reviewed and are negative.      Objective:   Physical Exam Vitals signs and nursing note reviewed.  Constitutional:      Appearance: Normal appearance.  Neck:     Musculoskeletal: Normal range of motion and neck supple.  Cardiovascular:     Rate and Rhythm: Normal rate and regular rhythm.     Pulses: Normal pulses.     Heart sounds: Normal heart sounds.  Pulmonary:     Effort: Pulmonary effort is normal.     Breath sounds: Normal breath sounds.  Musculoskeletal:     Comments: Normal Muscle Bulk and Muscle Testing Reveals:  Upper Extremities: Full ROM and Muscle Strength 5/5  Lower Extremities: Full ROM and Muscle Strength 5/5 Arises from Table with ease using cane for support Narrow Based Gait   Skin:    General: Skin is warm and dry.  Neurological:     Mental Status: She is alert and oriented to person, place, and time.  Psychiatric:        Mood and  Affect: Mood normal.        Behavior: Behavior normal.           Assessment & Plan:  1.Severe peripheral polyneuropathy of undetermined etiology.:02/09/2019 Refilled: MS Contin30 mg one tablet every 12 hours #60 andcontinueMSIR 30mg  one tablet at HS, may take a tablet during the day as needed #60.  We will continue the opioid  monitoring program, this consists of regular clinic visits, examinations, urine drug screen, pill counts as well as use of [place] Controlled Substance Reporting System. 2. Bilateral progressive upper extremity hand pain/ Polyneuropathy.Continue with current medication regime. Tight Glucose Control. Adhere to healthy diet regime.Continue with current medication regimen withnortriptyline:02/09/2019 3. Muscle Spasms: Continuecurrent medication regimen withZanaflex. 02/09/2019 4. Depression/Anxiety: Continuecurrent medication regimen withPaxil and Klonopin. 02/09/2019 5. Bilateral Chronic Knee Pain: No complaints Today.Continue HEP as Tolerated and Continue to Monitor. 02/09/2019. 6. Right Diabetic Foot Ulcer: No complaints today. Podiatry, [place] and Vascular Following.. 02/09/2019 7. Left-sided Low Back Pain:No complaints today.Continue current medication regimen. Continue to monitor.02/09/19  F/U in 1 month  55minutes of face to face patient care time was spent during this visit. All questions were encouraged and answered.

## 2019-02-11 ENCOUNTER — Telehealth: Payer: Self-pay | Admitting: *Deleted

## 2019-02-11 NOTE — Telephone Encounter (Addendum)
Prior authorization submitted to [place] [facility] for morphine sulfate IR.  Approved 02/12/2019-08/11/2019

## 2019-02-12 LAB — TOXASSURE SELECT,+ANTIDEPR,UR

## 2019-02-13 ENCOUNTER — Telehealth: Payer: Self-pay | Admitting: *Deleted

## 2019-02-13 NOTE — Telephone Encounter (Signed)
Urine drug screen for this encounter is consistent for prescribed medication 

## 2019-03-12 ENCOUNTER — Other Ambulatory Visit: Payer: Self-pay

## 2019-03-12 ENCOUNTER — Encounter: Payer: Self-pay | Admitting: Registered Nurse

## 2019-03-12 ENCOUNTER — Encounter: Payer: Medicaid Other | Attending: Physical Medicine & Rehabilitation | Admitting: Registered Nurse

## 2019-03-12 VITALS — BP 143/80 | HR 112 | Temp 97.2°F | Ht 72.0 in | Wt 285.0 lb

## 2019-03-12 DIAGNOSIS — M792 Neuralgia and neuritis, unspecified: Secondary | ICD-10-CM

## 2019-03-12 DIAGNOSIS — Z79891 Long term (current) use of opiate analgesic: Secondary | ICD-10-CM | POA: Diagnosis not present

## 2019-03-12 DIAGNOSIS — G894 Chronic pain syndrome: Secondary | ICD-10-CM | POA: Diagnosis not present

## 2019-03-12 DIAGNOSIS — M62838 Other muscle spasm: Secondary | ICD-10-CM

## 2019-03-12 DIAGNOSIS — M25562 Pain in left knee: Secondary | ICD-10-CM | POA: Insufficient documentation

## 2019-03-12 DIAGNOSIS — Z79899 Other long term (current) drug therapy: Secondary | ICD-10-CM | POA: Insufficient documentation

## 2019-03-12 DIAGNOSIS — M25561 Pain in right knee: Secondary | ICD-10-CM | POA: Insufficient documentation

## 2019-03-12 DIAGNOSIS — G629 Polyneuropathy, unspecified: Secondary | ICD-10-CM

## 2019-03-12 DIAGNOSIS — E1140 Type 2 diabetes mellitus with diabetic neuropathy, unspecified: Secondary | ICD-10-CM

## 2019-03-12 DIAGNOSIS — F341 Dysthymic disorder: Secondary | ICD-10-CM

## 2019-03-12 DIAGNOSIS — Z5181 Encounter for therapeutic drug level monitoring: Secondary | ICD-10-CM | POA: Diagnosis not present

## 2019-03-12 MED ORDER — MORPHINE SULFATE 30 MG PO TABS: ORAL_TABLET | ORAL | 0 refills | Status: DC

## 2019-03-12 MED ORDER — MORPHINE SULFATE ER 30 MG PO TBCR: 30.0000 mg | EXTENDED_RELEASE_TABLET | Freq: Two times a day (BID) | ORAL | 0 refills | Status: DC

## 2019-03-12 NOTE — Progress Notes (Signed)
Subjective:    Patient ID: [name], female    DOB: Apr 09, 1970, 48 y.o.   MRN: [id number]  HPI: [name] is a 48 y.o. female who returns for follow up appointment for chronic pain and medication refill. She states her pain is located in her bilateral hands and bilateral feet with tingling and burning. She rates her pain 8. Her current exercise regime is walking.  Ms. Constantinides Morphine equivalent is 120.00 MME. She  is also prescribed Diazepam. We have discussed the black box warning of using opioids and benzodiazepines. I highlighted the dangers of using these drugs together and discussed the adverse events including respiratory suppression, overdose, cognitive impairment and importance of compliance with current regimen. We will continue to monitor and adjust as indicated.   Last UDS was Performed on 02/09/2019, it was consistent.    Pain Inventory Average Pain 8 Pain Right Now 8 My pain is constant, sharp, burning, dull, stabbing, tingling and aching  In the last 24 hours, has pain interfered with the following? General activity 8 Relation with others 8 Enjoyment of life 8 What TIME of day is your pain at its worst? morning and evening Sleep (in general) Fair  Pain is worse with: walking, bending, sitting, inactivity, standing and some activites Pain improves with: rest and medication Relief from Meds: 8  Mobility walk without assistance walk with assistance use a cane how many minutes can you walk? 5 ability to climb steps?  no do you drive?  no  Function disabled: date disabled . I need assistance with the following:  meal prep, household duties and shopping  Neuro/Psych weakness numbness tremor tingling trouble walking spasms dizziness depression anxiety  Prior Studies Any changes since last visit?  no  Physicians involved in your care Any changes since last visit?  no   Family History  Problem Relation Age of Onset  .  Fibromyalgia Mother   . Aortic aneurysm Mother   . Stroke Maternal Grandmother   . Colon cancer Maternal Grandmother   . Heart Problems Paternal Grandmother   . Dementia Paternal Grandmother   . Heart attack Paternal Grandfather    Social History   Socioeconomic History  . Marital status: Legally Separated    Spouse name: Not on file  . Number of children: 1  . Years of education: Not on file  . Highest education level: Bachelor's degree (e.g., BA, AB, BS)  Occupational History  . Occupation: Disabled  Tobacco Use  . Smoking status: Never Smoker  . Smokeless tobacco: Never Used  Substance and Sexual Activity  . Alcohol use: No    Alcohol/week: 0.0 standard drinks  . Drug use: No    Comment: rx opiods  . Sexual activity: Yes    Birth control/protection: Condom  Other Topics Concern  . Not on file  Social History Narrative  . Not on file   Social Determinants of Health   Financial Resource Strain: Low Risk   . Difficulty of Paying Living Expenses: Not very hard  Food Insecurity: No Food Insecurity  . Worried About Charity fundraiser in the Last Year: Never true  . Ran Out of Food in the Last Year: Never true  Transportation Needs: No Transportation Needs  . Lack of Transportation (Medical): No  . Lack of Transportation (Non-Medical): No  Physical Activity: Inactive  . Days of Exercise per Week: 0 days  . Minutes of Exercise per Session: 0 min  Stress: No Stress Concern Present  .  Feeling of Stress : Only a little  Social Connections: Unknown  . Frequency of Communication with Friends and Family: Once a week  . Frequency of Social Gatherings with Friends and Family: Once a week  . Attends Religious Services: Patient refused  . Active Member of Clubs or Organizations: No  . Attends Archivist Meetings: Never  . Marital Status: Separated   Past Surgical History:  Procedure Laterality Date  . ABDOMINOPLASTY    . CHOLECYSTECTOMY     Past Medical  History:  Diagnosis Date  . DEPRESSION/ANXIETY 08/08/2009  . Diabetes mellitus without complication ([place])   . IBS 02/02/2010  . Neuropathy   . Polyneuropathy 12/04/2010  . Renal stones   . 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) 08/08/2009   BP (!) 143/80   Pulse (!) 112   Temp (!) 97.2 F (36.2 C)   Ht 6' (1.829 m)   Wt 285 lb (129.3 kg)   SpO2 97%   BMI 38.65 kg/m   Opioid Risk Score:   Fall Risk Score:  `1  Depression screen PHQ 2/9  Depression screen [facility] 2/9 10/13/2018 05/20/2018 09/10/2017 07/11/2017 06/18/2017 04/24/2017 03/22/2017  Decreased Interest 2 1 1  0 1 3 3   Down, Depressed, Hopeless 2 1 1  0 1 3 3   PHQ - 2 Score 4 2 2  0 2 6 6   Altered sleeping - - - - - - -  Tired, decreased energy - - - - - - -  Change in appetite - - - - - - -  Feeling bad or failure about yourself  - - - - - - -  Trouble concentrating - - - - - - -  Moving slowly or fidgety/restless - - - - - - -  Suicidal thoughts - - - - - - -  PHQ-9 Score - - - - - - -  Difficult doing work/chores - - - - - - -  Some recent data might be hidden     Review of Systems  Neurological: Positive for dizziness, tremors, weakness and numbness.  Psychiatric/Behavioral: Positive for dysphoric mood. The patient is nervous/anxious.   All other systems reviewed and are negative.      Objective:   Physical Exam Vitals and nursing note reviewed.  Constitutional:      Appearance: Normal appearance. She is obese.  Cardiovascular:     Rate and Rhythm: Normal rate and regular rhythm.     Pulses: Normal pulses.     Heart sounds: Normal heart sounds.  Pulmonary:     Effort: Pulmonary effort is normal.     Breath sounds: Normal breath sounds.  Musculoskeletal:     Cervical back: Normal range of motion and neck supple.     Comments: Normal Muscle Bulk and Muscle Testing Reveals:  Upper Extremities: Full ROM and Muscle Strength 5/5 Lower  Extremities: Full ROM and Muscle Strength 5/5 Arises from Table with ease using cane for support Narrow Based  Gait   Skin:    General: Skin is warm and dry.  Neurological:     Mental Status: She is oriented to person, place, and time.  Psychiatric:        Mood and Affect: Mood normal.        Behavior: Behavior normal.           Assessment & Plan:  1.Severe peripheral polyneuropathy of undetermined etiology.:03/12/2019 Refilled: MS Contin30 mg one tablet every 12 hours #  60 andcontinueMSIR 30mg  one tablet at HS, may take a tablet during the day as needed #60.  We will continue the opioid monitoring program, this consists of regular clinic visits, examinations, urine drug screen, pill counts as well as use of [place] Controlled Substance Reporting System. 2. Bilateral progressive upper extremity hand pain/ Polyneuropathy.Continue with current medication regime. Tight Glucose Control. Adhere to healthy diet regime.Continue with current medication regimen withnortriptyline:03/12/2019 3. Muscle Spasms: Continuecurrent medication regimen withZanaflex. 03/12/2019 4. Depression/Anxiety: Continuecurrent medication regimen withPaxil and Klonopin. 03/12/2019 5. Bilateral Chronic Knee Pain: No complaints Today.Continue HEP as Tolerated and Continue to Monitor. 03/12/2019. 6. Right Diabetic Foot Ulcer: Podiatry Following. Wound CenterFollowing[facility] and V[facility]. 03/12/2019 7. Left-sided Low Back Pain:No complaints today.Continue current medication regimen. Continue to monitor.03/12/19  F/U in 1 month  5minutes of face to face patient care time was spent during this visit. All questions were encouraged and answered.

## 2019-04-03 ENCOUNTER — Other Ambulatory Visit: Payer: Self-pay | Admitting: Registered Nurse

## 2019-04-10 ENCOUNTER — Encounter: Payer: Medicaid Other | Admitting: Registered Nurse

## 2019-04-13 NOTE — Telephone Encounter (Signed)
Request from patient 

## 2019-04-14 ENCOUNTER — Other Ambulatory Visit: Payer: Self-pay

## 2019-04-14 ENCOUNTER — Encounter: Payer: Medicaid Other | Attending: Physical Medicine & Rehabilitation | Admitting: Registered Nurse

## 2019-04-14 VITALS — Temp 97.5°F | Ht 72.0 in | Wt 285.0 lb

## 2019-04-14 DIAGNOSIS — G894 Chronic pain syndrome: Secondary | ICD-10-CM | POA: Diagnosis not present

## 2019-04-14 DIAGNOSIS — G629 Polyneuropathy, unspecified: Secondary | ICD-10-CM

## 2019-04-14 DIAGNOSIS — Z5181 Encounter for therapeutic drug level monitoring: Secondary | ICD-10-CM

## 2019-04-14 DIAGNOSIS — M792 Neuralgia and neuritis, unspecified: Secondary | ICD-10-CM | POA: Diagnosis not present

## 2019-04-14 DIAGNOSIS — F341 Dysthymic disorder: Secondary | ICD-10-CM | POA: Diagnosis not present

## 2019-04-14 DIAGNOSIS — Z79891 Long term (current) use of opiate analgesic: Secondary | ICD-10-CM | POA: Diagnosis not present

## 2019-04-14 DIAGNOSIS — M62838 Other muscle spasm: Secondary | ICD-10-CM

## 2019-04-14 DIAGNOSIS — E1140 Type 2 diabetes mellitus with diabetic neuropathy, unspecified: Secondary | ICD-10-CM | POA: Diagnosis not present

## 2019-04-14 DIAGNOSIS — M25562 Pain in left knee: Secondary | ICD-10-CM | POA: Insufficient documentation

## 2019-04-14 DIAGNOSIS — M25561 Pain in right knee: Secondary | ICD-10-CM | POA: Insufficient documentation

## 2019-04-14 DIAGNOSIS — Z79899 Other long term (current) drug therapy: Secondary | ICD-10-CM | POA: Insufficient documentation

## 2019-04-14 MED ORDER — MORPHINE SULFATE ER 30 MG PO TBCR: 30.0000 mg | EXTENDED_RELEASE_TABLET | Freq: Two times a day (BID) | ORAL | 0 refills | Status: DC

## 2019-04-14 MED ORDER — CLONAZEPAM 0.5 MG PO TABS: ORAL_TABLET | ORAL | 4 refills | Status: DC

## 2019-04-14 MED ORDER — MORPHINE SULFATE 30 MG PO TABS: ORAL_TABLET | ORAL | 0 refills | Status: DC

## 2019-04-14 NOTE — Progress Notes (Signed)
Subjective:    Patient ID: [name], female    DOB: November 13, 1970, 49 y.o.   MRN: [id number]  HPI: [name] is a 49 y.o. female whose appointment was changed to a virtual office visit to reduce the risk of exposure to the COVID-19 virus and to help [name] remain healthy and safe. The virtual visit will also provide continuity of care. [name] agrees with tele-health visit verbalizes understanding.  She states her pain is located in her bilateral hands and bilateral feet. She rates her pain 6. His current exercise regime is walking and performing stretching exercises.  Ms. Rolison Morphine equivalent is 120.00  MME. She is also prescribed Clonazepam. We have discussed the black box warning of using opioids and benzodiazepines. I highlighted the dangers of using these drugs together and discussed the adverse events including respiratory suppression, overdose, cognitive impairment and importance of compliance with current regimen. We will continue to monitor and adjust as indicated.    Last UDS was Performed on 02/09/2019, it was consistent.  Pain Inventory Average Pain 7 Pain Right Now 6 My pain is sharp, burning, dull, stabbing, tingling and aching  In the last 24 hours, has pain interfered with the following? General activity 6 Relation with others 6 Enjoyment of life 6 What TIME of day is your pain at its worst? morning and evening Sleep (in general) Poor  Pain is worse with: walking and some activites Pain improves with: therapy/exercise and medication Relief from Meds: 9  Mobility use a cane how many minutes can you walk? 5 ability to climb steps?  no do you drive?  no  Function disabled: date disabled . I need assistance with the following:  meal prep and shopping  Neuro/Psych bladder control problems weakness numbness tingling trouble walking  Prior Studies Any changes since last visit?  no  Physicians involved in your  care Any changes since last visit?  no   Family History  Problem Relation Age of Onset  . Fibromyalgia Mother   . Aortic aneurysm Mother   . Stroke Maternal Grandmother   . Colon cancer Maternal Grandmother   . Heart Problems Paternal Grandmother   . Dementia Paternal Grandmother   . Heart attack Paternal Grandfather    Social History   Socioeconomic History  . Marital status: Legally Separated    Spouse name: Not on file  . Number of children: 1  . Years of education: Not on file  . Highest education level: Bachelor's degree (e.g., BA, AB, BS)  Occupational History  . Occupation: Disabled  Tobacco Use  . Smoking status: Never Smoker  . Smokeless tobacco: Never Used  Substance and Sexual Activity  . Alcohol use: No    Alcohol/week: 0.0 standard drinks  . Drug use: No    Comment: rx opiods  . Sexual activity: Yes    Birth control/protection: Condom  Other Topics Concern  . Not on file  Social History Narrative  . Not on file   Social Determinants of Health   Financial Resource Strain: Low Risk   . Difficulty of Paying Living Expenses: Not very hard  Food Insecurity: No Food Insecurity  . Worried About Charity fundraiser in the Last Year: Never true  . Ran Out of Food in the Last Year: Never true  Transportation Needs: No Transportation Needs  . Lack of Transportation (Medical): No  . Lack of Transportation (Non-Medical): No  Physical Activity: Inactive  . Days of Exercise per Week:  0 days  . Minutes of Exercise per Session: 0 min  Stress: No Stress Concern Present  . Feeling of Stress : Only a little  Social Connections: Unknown  . Frequency of Communication with Friends and Family: Once a week  . Frequency of Social Gatherings with Friends and Family: Once a week  . Attends Religious Services: Patient refused  . Active Member of Clubs or Organizations: No  . Attends Archivist Meetings: Never  . Marital Status: Separated   Past Surgical  History:  Procedure Laterality Date  . ABDOMINOPLASTY    . CHOLECYSTECTOMY     Past Medical History:  Diagnosis Date  . DEPRESSION/ANXIETY 08/08/2009  . Diabetes mellitus without complication ([facility])   . IBS 02/02/2010  . Neuropathy   . Polyneuropathy 12/04/2010  . Renal stones   . 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) 08/08/2009   Temp (!) 97.5 F (36.4 C) Comment: pt reported virtual visit  Ht 6' (1.829 m) Comment: pt reported, virtual visit  Wt 285 lb (129.3 kg) Comment: pt reported, virtual visit  BMI 38.65 kg/m   Opioid Risk Score:   Fall Risk Score:  `1  Depression screen PHQ 2/9  Depression screen [facility] 2/9 10/13/2018 05/20/2018 09/10/2017 07/11/2017 06/18/2017 04/24/2017 03/22/2017  Decreased Interest 2 1 1  0 1 3 3   Down, Depressed, Hopeless 2 1 1  0 1 3 3   PHQ - 2 Score 4 2 2  0 2 6 6   Altered sleeping - - - - - - -  Tired, decreased energy - - - - - - -  Change in appetite - - - - - - -  Feeling bad or failure about yourself  - - - - - - -  Trouble concentrating - - - - - - -  Moving slowly or fidgety/restless - - - - - - -  Suicidal thoughts - - - - - - -  PHQ-9 Score - - - - - - -  Difficult doing work/chores - - - - - - -  Some recent data might be hidden    Review of Systems  HENT: Positive for sore throat.   Gastrointestinal: Positive for abdominal pain.  Musculoskeletal:       Hands feet and leg pain spasms  Neurological: Positive for weakness and numbness.       Tingling       Objective:   Physical Exam Vitals and nursing note reviewed.  Musculoskeletal:     Comments: No physical exam performed: virtual visit.           Assessment & Plan:  1.Severe peripheral polyneuropathy of undetermined etiology.:04/14/2019 Refilled: MS Contin30 mg one tablet every 12 hours #60 andcontinueMSIR 30mg  one tablet at HS, may take a tablet during the day as needed #60.  We will  continue the opioid monitoring program, this consists of regular clinic visits, examinations, urine drug screen, pill counts as well as use of [place] Controlled Substance Reporting System. 2. Bilateral progressive upper extremity hand pain/ Polyneuropathy.Continue with current medication regime. Tight Glucose Control. Adhere to healthy diet regime.Continue with current medication regimen withnortriptyline:04/14/2019 3. Muscle Spasms: Continuecurrent medication regimen withZanaflex. 04/14/2019 4. Depression/Anxiety: Continuecurrent medication regimen withPaxil and Klonopin.04/14/2019 5. Bilateral Chronic Knee Pain: No complaints Today.Continue HEP as Tolerated and Continue to Monitor.04/14/2019. 6. Right Diabetic Foot Ulcer: Podiatry Following. Wound CenterFollowingon and Vascular.04/14/2019 7. Left-sided Low Back Pain:No complaints today.Continue current medication regimen. Continue to monitor.04/14/2019.  F/U in 1 month  73minutes of face to face patient care time was spent during this visit. All questions were encouraged and answered.

## 2019-04-15 ENCOUNTER — Encounter: Payer: Self-pay | Admitting: Registered Nurse

## 2019-05-08 ENCOUNTER — Ambulatory Visit (INDEPENDENT_AMBULATORY_CARE_PROVIDER_SITE_OTHER): Payer: Medicaid Other | Admitting: Family Medicine

## 2019-05-08 ENCOUNTER — Other Ambulatory Visit: Payer: Self-pay

## 2019-05-08 VITALS — BP 127/80 | HR 111 | Wt 287.2 lb

## 2019-05-08 DIAGNOSIS — E1152 Type 2 diabetes mellitus with diabetic peripheral angiopathy with gangrene: Secondary | ICD-10-CM

## 2019-05-08 DIAGNOSIS — I10 Essential (primary) hypertension: Secondary | ICD-10-CM | POA: Diagnosis not present

## 2019-05-08 DIAGNOSIS — Z789 Other specified health status: Secondary | ICD-10-CM

## 2019-05-08 DIAGNOSIS — E781 Pure hyperglyceridemia: Secondary | ICD-10-CM | POA: Diagnosis not present

## 2019-05-08 DIAGNOSIS — Z1211 Encounter for screening for malignant neoplasm of colon: Secondary | ICD-10-CM

## 2019-05-08 DIAGNOSIS — H9313 Tinnitus, bilateral: Secondary | ICD-10-CM | POA: Diagnosis not present

## 2019-05-08 DIAGNOSIS — Z8639 Personal history of other endocrine, nutritional and metabolic disease: Secondary | ICD-10-CM

## 2019-05-08 LAB — POCT GLYCOSYLATED HEMOGLOBIN (HGB A1C): HbA1c, POC (controlled diabetic range): 8.3 % — AB (ref 0.0–7.0)

## 2019-05-08 MED ORDER — ACCU-CHEK GUIDE W/DEVICE KIT: 1.0000 | PACK | Freq: Every day | 1 refills | Status: DC

## 2019-05-08 MED ORDER — EMPAGLIFLOZIN 10 MG PO TABS: 10.0000 mg | ORAL_TABLET | Freq: Every day | ORAL | 3 refills | Status: DC

## 2019-05-08 NOTE — Progress Notes (Signed)
Subjective:   Patient ID: BRITTINY LEVITZ    DOB: [birth year], 49 y.o. female   MRN: [id number]  [name] is a 49 y.o. female with a history of HTN, T2DM, IBS, polyneuropathy, chronic pain, depression/anxiety, hypertriglyceridemia, obesity, vegetarian diet here for follow up.  Diabetes: Last A1C 8.6, A1C today 8.3. Currently on Glipizide 18m, Liraglutide 1.840m Metformin 100021mID. Endorses compliance. Notes CBGs range 200-300 but she doesn't check her sugars that often. She thinks she needs a new meter as the readings are often inconsistent. Notes occasional times of feeling lightheaded. Denies any polyuria, polydipsia, polyphagia. Due for diabetic foot exam, diabetic eye exam.  HTN:  BP 127/80 today. Currently on Enalapril 40m59m. Endorses compliance. Denies any chest pain, SOB, vision changes, or headaches.   HLD: Last lipid panel on 09/23/18 notable for Chol 107, Trig 337, HDL 36, VLDL 67, LDL 4. Currently on Atorvastatin 80mg37mdorses compliance. Denies any muscles aches or weakness.  Health Maintenance: Due for diabetic eye exam and foot exam. Due for mammogram and colonoscopy. Due for pap-smear, flu shot.  Review of Systems:  Per HPI.   PMFSHLos Altosications and smoking status reviewed.  Objective:   BP 127/80   Pulse (!) 111   Wt 287 lb 3.2 oz (130.3 kg)   SpO2 100%   BMI 38.95 kg/m  Vitals and nursing note reviewed.  General: pleasant, well appearing, sitting comfortably on exam bed, in no acute distress with non-toxic appearance Resp: Breathing comfortably on room air, speaking in full sentences Skin: warm, dry Extremities: warm and well perfused MSK: gait normal Neuro:  speech normal  Diabetic Foot Exam - Simple   Simple Foot Form Diabetic Foot exam was performed with the following findings: Yes   Visual Inspection See comments: Yes Sensation Testing See comments: Yes Pulse Check Posterior Tibialis and Dorsalis pulse intact bilaterally:  Yes Comments Small erythematous and violaceous spot on right dorsal foot without signs of infection Unable to detect any monofilament sensation to feet diffusely bilaterally.     Assessment & Plan:   Type 2 diabetes mellitus with diabetic peripheral angiopathy with gangrene (HCC) A1C only slightly improved today at 8.3, down from 8.6 from previous visit. Will stop Glipizide and transition to Jardiance 40mg 62mContinue Metformin 1000mg B34mLiraglutide 1.8mg QD.15mcommended patient monitor blood sugars daily (preferably at different times a day) and keep a log so we can get an idea of daily blood sugars. Will increase Jardiance to 25mg QD 56mppears CBGs are elevated.   - Diabetic foot exam performed today with significant neuropathy present. Patient instructed to evaluate feet daily for any new wounds and see medical provider for care immediately upon inspection of new wound. She understood and agreed to plan.  - CMP ordered and kidney function is stable.  Will plan to follow up in 1 month via virtual appointment to see how patient is tolerating medication.  - Patient to return in 2-3 weeks for lab visit for BMP to monitor kidney function.  - New glucose meter sent to pharmacy.  Essential hypertension, benign Normotensive today and at goal with Enalapril 40mg QD. 54m continue current treatment.  Hypertriglyceridemia Lipid panel obtained. LDL level well below goal. Triglycerides remain very elevated which appears to be chronic for her x years. Will discuss starting a fenofibrate to help lower her cholesterol levels given inadequate improvement with statin and lifestyle modifications. She is considered high risk for cardiovascular disease given diabetes and known retinopathy. Will also  consider referral to Lipid clinic for further evaluation as well. Will trial TriCor: 145 mg once daily. Monitor lipid panel in 2-3 months to evaluate for efficacy.   Vegetarian diet Obtain Vitamin B12 levels  given history of vegetarian diet. Labs were WNL.   History of vitamin D deficiency History of Vitamin D deficiency. Vit D level obtained and WNL. Recommend continuing daily OTC Vitamin D supplement.    ENT Referral: Per patient request, ENT referral placed.  Health Maintenance:  Referral for colonoscopy placed. Patient to call to schedule Mammogram at the [place] Patient to call to schedule diabetic eye exam  Diabetic foot exam performed today.    Orders Placed This Encounter  Procedures  . Lipid Panel  . Comprehensive metabolic panel  . Vitamin B12  . Vitamin D, 25-hydroxy  . Basic Metabolic Panel    Standing Status:   Future    Standing Expiration Date:   05/09/2020  . Ambulatory referral to Gastroenterology    Referral Priority:   Routine    Referral Type:   Consultation    Referral Reason:   Specialty Services Required    Number of Visits Requested:   1  . Ambulatory referral to ENT    Referral Priority:   Routine    Referral Type:   Consultation    Referral Reason:   Specialty Services Required    Requested Specialty:   Otolaryngology    Number of Visits Requested:   1  . HgB A1c   Meds ordered this encounter  Medications  . empagliflozin (JARDIANCE) 10 MG TABS tablet    Sig: Take 10 mg by mouth daily.    Dispense:  90 tablet    Refill:  3  . Blood Glucose Monitoring Suppl (ACCU-CHEK GUIDE) w/Device KIT    Sig: 1 Device by Does not apply route daily.    Dispense:  1 kit    Refill:  1  . fenofibrate (TRICOR) 145 MG tablet    Sig: Take 1 tablet (145 mg total) by mouth daily.    Dispense:  30 tablet    Refill:  2    Vanessa Marble, DO PGY-2, [facility] 05/10/2019 4:03 PM

## 2019-05-08 NOTE — Patient Instructions (Addendum)
Please call you eye doctor to schedule a diabetic eye exam. Please have these results faxed to our office for your records.  I have placed a referral for a mammogram and colonoscopy. Please call and schedule these at your earliest convenience.   Referral placed to ENT per your request. You may call to schedule at your convenience.  Please stop taking the Glipizide. Start taking the Jardiance 10mg  daily. Please be sure to drink lots of water. We will plan to follow up via virtual on 06/05/19  I have obtained labs. I will contact you if anything is positive.   Take care,  Dr. Tarry Kos

## 2019-05-09 LAB — COMPREHENSIVE METABOLIC PANEL
ALT: 24 IU/L (ref 0–32)
AST: 19 IU/L (ref 0–40)
Albumin/Globulin Ratio: 1.4 (ref 1.2–2.2)
Albumin: 4.2 g/dL (ref 3.8–4.9)
Alkaline Phosphatase: 57 IU/L (ref 39–117)
BUN/Creatinine Ratio: 20 (ref 9–23)
BUN: 11 mg/dL (ref 6–24)
Bilirubin Total: 0.2 mg/dL (ref 0.0–1.2)
CO2: 22 mmol/L (ref 20–29)
Calcium: 9.6 mg/dL (ref 8.7–10.2)
Chloride: 99 mmol/L (ref 96–106)
Creatinine, Ser: 0.55 mg/dL — ABNORMAL LOW (ref 0.57–1.00)
GFR calc Af Amer: 126 mL/min/{1.73_m2} (ref >59)
GFR calc non Af Amer: 109 mL/min/{1.73_m2} (ref >59)
Globulin, Total: 3.1 g/dL (ref 1.5–4.5)
Glucose: 118 mg/dL — ABNORMAL HIGH (ref 65–99)
Potassium: 4.5 mmol/L (ref 3.5–5.2)
Sodium: 140 mmol/L (ref 134–144)
Total Protein: 7.3 g/dL (ref 6.0–8.5)

## 2019-05-09 LAB — LIPID PANEL
Chol/HDL Ratio: 3.7 ratio (ref 0.0–4.4)
Cholesterol, Total: 118 mg/dL (ref 100–199)
HDL: 32 mg/dL — ABNORMAL LOW (ref >39)
LDL Chol Calc (NIH): 12 mg/dL (ref 0–99)
Triglycerides: 577 mg/dL (ref 0–149)
VLDL Cholesterol Cal: 74 mg/dL — ABNORMAL HIGH (ref 5–40)

## 2019-05-09 LAB — VITAMIN B12: Vitamin B-12: 1066 pg/mL (ref 232–1245)

## 2019-05-09 LAB — VITAMIN D 25 HYDROXY (VIT D DEFICIENCY, FRACTURES): Vit D, 25-Hydroxy: 34.8 ng/mL (ref 30.0–100.0)

## 2019-05-10 DIAGNOSIS — Z8639 Personal history of other endocrine, nutritional and metabolic disease: Secondary | ICD-10-CM | POA: Insufficient documentation

## 2019-05-10 MED ORDER — FENOFIBRATE 145 MG PO TABS: 145.0000 mg | ORAL_TABLET | Freq: Every day | ORAL | 2 refills | Status: DC

## 2019-05-10 NOTE — Assessment & Plan Note (Signed)
History of Vitamin D deficiency. Vit D level obtained and WNL. Recommend continuing daily OTC Vitamin D supplement.

## 2019-05-10 NOTE — Assessment & Plan Note (Addendum)
A1C only slightly improved today at 8.3, down from 8.6 from previous visit. Will stop Glipizide and transition to Jardiance 10mg  QD. Continue Metformin 1000mg  BID, Liraglutide 1.8mg  QD. Recommended patient monitor blood sugars daily (preferably at different times a day) and keep a log so we can get an idea of daily blood sugars. Will increase Jardiance to 25mg  QD if appears CBGs are elevated.   - Diabetic foot exam performed today with significant neuropathy present. Patient instructed to evaluate feet daily for any new wounds and see medical provider for care immediately upon inspection of new wound. She understood and agreed to plan.  - CMP ordered and kidney function is stable.  Will plan to follow up in 1 month via virtual appointment to see how patient is tolerating medication.  - Patient to return in 2-3 weeks for lab visit for BMP to monitor kidney function.  - New glucose meter sent to pharmacy.

## 2019-05-10 NOTE — Assessment & Plan Note (Signed)
Obtain Vitamin B12 levels given history of vegetarian diet. Labs were WNL.

## 2019-05-10 NOTE — Assessment & Plan Note (Addendum)
Lipid panel obtained. LDL level well below goal. Triglycerides remain very elevated which appears to be chronic for her x years. Will discuss starting a fenofibrate to help lower her cholesterol levels given inadequate improvement with statin and lifestyle modifications. She is considered high risk for cardiovascular disease given diabetes and known retinopathy. Will also consider referral to Lipid clinic for further evaluation as well. Will trial TriCor: 145 mg once daily. Monitor lipid panel in 2-3 months to evaluate for efficacy.

## 2019-05-10 NOTE — Assessment & Plan Note (Signed)
Normotensive today and at goal with Enalapril 10mg  QD. Will continue current treatment.

## 2019-05-11 ENCOUNTER — Encounter: Payer: Self-pay | Admitting: Registered Nurse

## 2019-05-11 ENCOUNTER — Other Ambulatory Visit: Payer: Self-pay

## 2019-05-11 ENCOUNTER — Encounter: Payer: Medicaid Other | Attending: Physical Medicine & Rehabilitation | Admitting: Registered Nurse

## 2019-05-11 VITALS — BP 135/85 | HR 101 | Temp 97.5°F | Ht 72.0 in | Wt 287.0 lb

## 2019-05-11 DIAGNOSIS — Z79891 Long term (current) use of opiate analgesic: Secondary | ICD-10-CM | POA: Diagnosis not present

## 2019-05-11 DIAGNOSIS — G629 Polyneuropathy, unspecified: Secondary | ICD-10-CM | POA: Diagnosis not present

## 2019-05-11 DIAGNOSIS — E1140 Type 2 diabetes mellitus with diabetic neuropathy, unspecified: Secondary | ICD-10-CM | POA: Diagnosis not present

## 2019-05-11 DIAGNOSIS — Z79899 Other long term (current) drug therapy: Secondary | ICD-10-CM | POA: Diagnosis not present

## 2019-05-11 DIAGNOSIS — M25561 Pain in right knee: Secondary | ICD-10-CM | POA: Insufficient documentation

## 2019-05-11 DIAGNOSIS — G894 Chronic pain syndrome: Secondary | ICD-10-CM | POA: Diagnosis not present

## 2019-05-11 DIAGNOSIS — M25562 Pain in left knee: Secondary | ICD-10-CM | POA: Insufficient documentation

## 2019-05-11 DIAGNOSIS — Z5181 Encounter for therapeutic drug level monitoring: Secondary | ICD-10-CM | POA: Insufficient documentation

## 2019-05-11 DIAGNOSIS — F341 Dysthymic disorder: Secondary | ICD-10-CM | POA: Diagnosis not present

## 2019-05-11 DIAGNOSIS — M792 Neuralgia and neuritis, unspecified: Secondary | ICD-10-CM | POA: Diagnosis not present

## 2019-05-11 DIAGNOSIS — M62838 Other muscle spasm: Secondary | ICD-10-CM

## 2019-05-11 MED ORDER — MORPHINE SULFATE ER 30 MG PO TBCR: 30.0000 mg | EXTENDED_RELEASE_TABLET | Freq: Two times a day (BID) | ORAL | 0 refills | Status: DC

## 2019-05-11 MED ORDER — MORPHINE SULFATE 30 MG PO TABS: ORAL_TABLET | ORAL | 0 refills | Status: DC

## 2019-05-11 NOTE — Progress Notes (Signed)
Subjective:    Patient ID: [name], female    DOB: 1971-03-01, 49 y.o.   MRN: [id number]  HPI: [name] is a 49 y.o. female who returns for follow up appointment for chronic pain and medication refill. She states her pain is located in her bilateral hands and bilateral feet. She rates her pain 6. Her current exercise regime is walking.  [name] Morphine equivalent is 120.00 MME. She is also prescribed Clonazepam. We have discussed the black box warning of using opioids and benzodiazepines. I highlighted the dangers of using these drugs together and discussed the adverse events including respiratory suppression, overdose, cognitive impairment and importance of compliance with current regimen. We will continue to monitor and adjust as indicated.   Last UDS was Performed on 02/09/2019, it was consistent.    Pain Inventory Average Pain 6 Pain Right Now 6 My pain is constant, sharp, burning, dull, stabbing, tingling and aching  In the last 24 hours, has pain interfered with the following? General activity 9 Relation with others 9 Enjoyment of life 9 What TIME of day is your pain at its worst? morning, evening Sleep (in general) Poor  Pain is worse with: walking, bending, standing, unsure and some activites Pain improves with: rest and medication Relief from Meds: 6  Mobility walk with assistance use a cane how many minutes can you walk? 10 ability to climb steps?  no do you drive?  no Do you have any goals in this area?  yes  Function I need assistance with the following:  meal prep, household duties and shopping Do you have any goals in this area?  yes  Neuro/Psych weakness numbness tremor tingling trouble walking spasms dizziness depression anxiety  Prior Studies Any changes since last visit?  no  Physicians involved in your care Any changes since last visit?  no   Family History  Problem Relation Age of Onset  . Fibromyalgia  Mother   . Aortic aneurysm Mother   . Stroke Maternal Grandmother   . Colon cancer Maternal Grandmother   . Heart Problems Paternal Grandmother   . Dementia Paternal Grandmother   . Heart attack Paternal Grandfather    Social History   Socioeconomic History  . Marital status: Legally Separated    Spouse name: Not on file  . Number of children: 1  . Years of education: Not on file  . Highest education level: Bachelor's degree (e.g., BA, AB, BS)  Occupational History  . Occupation: Disabled  Tobacco Use  . Smoking status: Never Smoker  . Smokeless tobacco: Never Used  Substance and Sexual Activity  . Alcohol use: No    Alcohol/week: 0.0 standard drinks  . Drug use: No    Comment: rx opiods  . Sexual activity: Yes    Birth control/protection: Condom  Other Topics Concern  . Not on file  Social History Narrative  . Not on file   Social Determinants of Health   Financial Resource Strain:   . Difficulty of Paying Living Expenses: Not on file  Food Insecurity:   . Worried About Charity fundraiser in the Last Year: Not on file  . Ran Out of Food in the Last Year: Not on file  Transportation Needs:   . Lack of Transportation (Medical): Not on file  . Lack of Transportation (Non-Medical): Not on file  Physical Activity:   . Days of Exercise per Week: Not on file  . Minutes of Exercise per Session: Not on  file  Stress:   . Feeling of Stress : Not on file  Social Connections:   . Frequency of Communication with Friends and Family: Not on file  . Frequency of Social Gatherings with Friends and Family: Not on file  . Attends Religious Services: Not on file  . Active Member of Clubs or Organizations: Not on file  . Attends Archivist Meetings: Not on file  . Marital Status: Not on file   Past Surgical History:  Procedure Laterality Date  . ABDOMINOPLASTY    . CHOLECYSTECTOMY     Past Medical History:  Diagnosis Date  . DEPRESSION/ANXIETY 08/08/2009  .  Diabetes mellitus without complication ([place])   . IBS 02/02/2010  . Neuropathy   . Polyneuropathy 12/04/2010  . Renal stones   . 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) 08/08/2009   BP 135/85   Pulse (!) 101   Temp (!) 97.5 F (36.4 C)   Ht 6' (1.829 m)   Wt 287 lb (130.2 kg)   SpO2 99%   BMI 38.92 kg/m   Opioid Risk Score:   Fall Risk Score:  `1  Depression screen PHQ 2/9  Depression screen [facility] 2/9 10/13/2018 05/20/2018 09/10/2017 07/11/2017 06/18/2017 04/24/2017 03/22/2017  Decreased Interest 2 1 1  0 1 3 3   Down, Depressed, Hopeless 2 1 1  0 1 3 3   PHQ - 2 Score 4 2 2  0 2 6 6   Altered sleeping - - - - - - -  Tired, decreased energy - - - - - - -  Change in appetite - - - - - - -  Feeling bad or failure about yourself  - - - - - - -  Trouble concentrating - - - - - - -  Moving slowly or fidgety/restless - - - - - - -  Suicidal thoughts - - - - - - -  PHQ-9 Score - - - - - - -  Difficult doing work/chores - - - - - - -  Some recent data might be hidden    Review of Systems  Constitutional: Negative.   HENT: Negative.   Eyes: Negative.   Respiratory: Negative.   Cardiovascular: Negative.   Gastrointestinal: Negative.   Endocrine: Negative.   Genitourinary: Negative.   Musculoskeletal: Negative.        Spasms   Skin: Negative.   Allergic/Immunologic: Negative.   Neurological: Positive for dizziness, tremors, weakness and numbness.       Tingling  Hematological: Negative.   Psychiatric/Behavioral: Positive for dysphoric mood. The patient is nervous/anxious.   All other systems reviewed and are negative.      Objective:   Physical Exam Vitals and nursing note reviewed.  Constitutional:      Appearance: Normal appearance. She is obese.  Cardiovascular:     Rate and Rhythm: Normal rate and regular rhythm.     Pulses: Normal pulses.     Heart sounds: Normal heart sounds.  Pulmonary:      Effort: Pulmonary effort is normal.     Breath sounds: Normal breath sounds.  Musculoskeletal:     Cervical back: Normal range of motion and neck supple.     Comments: Normal Muscle Bulk and Muscle Testing Reveals:  Upper Extremities: Full ROM and Muscle Strength 5/5 Lower Extremities: Full ROM and Muscle Strength 5/5 Arises from Table Slowly using cane for support Narrow Based  Gait   Skin:    General: Skin  is warm and dry.  Neurological:     Mental Status: She is alert and oriented to person, place, and time.  Psychiatric:        Mood and Affect: Mood normal.        Behavior: Behavior normal.           Assessment & Plan:  1.Severe peripheral polyneuropathy of undetermined etiology.:05/11/2019 Refilled: MS Contin30 mg one tablet every 12 hours #60 andcontinueMSIR 30mg  one tablet at HS, may take a tablet during the day as needed #60.  We will continue the opioid monitoring program, this consists of regular clinic visits, examinations, urine drug screen, pill counts as well as use of [place] Controlled Substance Reporting System. 2. Bilateral progressive upper extremity hand pain/ Polyneuropathy.Continue with current medication regime. Tight Glucose Control. Adhere to healthy diet regime.Continue with current medication regimen withnortriptyline:05/11/2019 3. Muscle Spasms: Continuecurrent medication regimen withZanaflex. 05/11/2019 4. Depression/Anxiety: Continuecurrent medication regimen withPaxil and Klonopin.05/11/2019 5. Bilateral Chronic Knee Pain: No complaints Today.Continue HEP as Tolerated and Continue to Monitor.05/11/2019. 6. Right Diabetic Foot Ulcer: Podiatry Following. Wound CenterFollowingon and Vascular.05/11/2019 7. Left-sided Low Back Pain:No complaints today.Continue current medication regimen. Continue to monitor.05/11/2019.  F/U in 1 month  52minutes of face to face patient care time was spent during this visit. All  questions were encouraged and answered.

## 2019-05-12 ENCOUNTER — Other Ambulatory Visit: Payer: Self-pay | Admitting: Family Medicine

## 2019-05-12 ENCOUNTER — Encounter: Payer: Self-pay | Admitting: Family Medicine

## 2019-05-12 DIAGNOSIS — E1139 Type 2 diabetes mellitus with other diabetic ophthalmic complication: Secondary | ICD-10-CM | POA: Insufficient documentation

## 2019-05-12 DIAGNOSIS — E118 Type 2 diabetes mellitus with unspecified complications: Secondary | ICD-10-CM

## 2019-05-12 DIAGNOSIS — E11319 Type 2 diabetes mellitus with unspecified diabetic retinopathy without macular edema: Secondary | ICD-10-CM

## 2019-05-12 MED ORDER — ACCU-CHEK GUIDE VI STRP: ORAL_STRIP | 12 refills | Status: DC

## 2019-05-12 MED ORDER — ACCU-CHEK SOFTCLIX LANCETS MISC: 12 refills | Status: DC

## 2019-05-12 MED ORDER — ACCU-CHEK SOFTCLIX LANCET DEV KIT: 1.0000 | PACK | Freq: Three times a day (TID) | 1 refills | Status: AC

## 2019-05-13 ENCOUNTER — Other Ambulatory Visit: Payer: Self-pay

## 2019-05-13 DIAGNOSIS — E119 Type 2 diabetes mellitus without complications: Secondary | ICD-10-CM

## 2019-05-13 MED ORDER — VICTOZA 18 MG/3ML ~~LOC~~ SOPN: PEN_INJECTOR | SUBCUTANEOUS | 3 refills | Status: DC

## 2019-05-13 MED ORDER — ENALAPRIL MALEATE 10 MG PO TABS: ORAL_TABLET | ORAL | 1 refills | Status: DC

## 2019-05-15 ENCOUNTER — Other Ambulatory Visit: Payer: Self-pay

## 2019-05-15 ENCOUNTER — Encounter: Payer: Self-pay | Admitting: Family Medicine

## 2019-05-15 MED ORDER — PAROXETINE HCL 40 MG PO TABS: ORAL_TABLET | ORAL | 1 refills | Status: DC

## 2019-05-18 ENCOUNTER — Encounter: Payer: Self-pay | Admitting: Family Medicine

## 2019-05-21 ENCOUNTER — Telehealth: Payer: Self-pay | Admitting: *Deleted

## 2019-05-21 NOTE — Telephone Encounter (Signed)
LVM to call office to go over screening questions prior to visit.[name], CMA  

## 2019-05-22 ENCOUNTER — Ambulatory Visit: Payer: Medicaid Other

## 2019-05-22 ENCOUNTER — Other Ambulatory Visit: Payer: Medicaid Other

## 2019-06-02 ENCOUNTER — Encounter: Payer: Self-pay | Admitting: Gastroenterology

## 2019-06-02 ENCOUNTER — Other Ambulatory Visit: Payer: Self-pay | Admitting: Family Medicine

## 2019-06-02 DIAGNOSIS — Z1231 Encounter for screening mammogram for malignant neoplasm of breast: Secondary | ICD-10-CM

## 2019-06-05 ENCOUNTER — Other Ambulatory Visit: Payer: Self-pay

## 2019-06-05 ENCOUNTER — Telehealth (INDEPENDENT_AMBULATORY_CARE_PROVIDER_SITE_OTHER): Payer: Medicaid Other | Admitting: Family Medicine

## 2019-06-05 NOTE — Progress Notes (Deleted)
[facility] Telemedicine Visit  Patient consented to have virtual visit. Method of visit: {TELEPHONE VS [id number]  Encounter participants: Patient: [name] - located at *** Provider: Danna Hefty - located at *** Others (if applicable): ***  Chief Complaint: ***  HPI:  ***  ROS: per HPI  Pertinent PMHx: ***  Exam:  Respiratory: ***  Assessment/Plan:  No problem-specific Assessment & Plan notes found for this encounter.    Time spent during visit with patient: *** minutes  Mina Marble, [place], PGY2 06/05/19

## 2019-06-08 ENCOUNTER — Other Ambulatory Visit: Payer: Self-pay | Admitting: Family Medicine

## 2019-06-08 ENCOUNTER — Other Ambulatory Visit: Payer: Self-pay | Admitting: Student in an Organized Health Care Education/Training Program

## 2019-06-08 ENCOUNTER — Other Ambulatory Visit: Payer: Self-pay | Admitting: Registered Nurse

## 2019-06-08 ENCOUNTER — Encounter: Payer: Medicaid Other | Admitting: Registered Nurse

## 2019-06-08 ENCOUNTER — Encounter: Payer: Self-pay | Admitting: Family Medicine

## 2019-06-08 DIAGNOSIS — N644 Mastodynia: Secondary | ICD-10-CM

## 2019-06-08 DIAGNOSIS — E118 Type 2 diabetes mellitus with unspecified complications: Secondary | ICD-10-CM

## 2019-06-08 DIAGNOSIS — Z1231 Encounter for screening mammogram for malignant neoplasm of breast: Secondary | ICD-10-CM

## 2019-06-08 MED ORDER — DAPAGLIFLOZIN PROPANEDIOL 5 MG PO TABS: 5.0000 mg | ORAL_TABLET | Freq: Every day | ORAL | 3 refills | Status: DC

## 2019-06-08 NOTE — Telephone Encounter (Signed)
Spoke to patient over telephone. Discussed recent change in diabetes management. She experienced GI upset when starting Fenofibrate and Jardiance together and thus discontinued. She restarted Fenofibrate without recurrence of symptoms. She is reluctant to restart the SGLT-2. Patient amendable to transitioning to alternative SGLT-2. Will call in [place] 5mg  QD. Patient to continue to hold Glipizide. Will obtain BMP in 1-2 weeks. Lab order placed and lab visit scheduled. Patient to follow up in 1 month for diabetes follow up. Patient to call if any concerns or issues with new medicine.

## 2019-06-08 NOTE — Telephone Encounter (Signed)
message from patient

## 2019-06-08 NOTE — Progress Notes (Signed)
Opened in error. Appointment rescheduled for 07/21/19.

## 2019-06-10 ENCOUNTER — Telehealth: Payer: Self-pay

## 2019-06-10 MED ORDER — MORPHINE SULFATE ER 30 MG PO TBCR: 30.0000 mg | EXTENDED_RELEASE_TABLET | Freq: Two times a day (BID) | ORAL | 0 refills | Status: DC

## 2019-06-10 NOTE — Telephone Encounter (Signed)
Patient called stating she had to change her appt and will be out of Morphine ER. Next appt 06/18/2019

## 2019-06-10 NOTE — Telephone Encounter (Signed)
PMP was Reviewed: Morphine ER was e-scribed today, she has a scheduled appointment on 06/18/2019. Ms. Delles was called regarding the above and verbalizes understanding.

## 2019-06-12 ENCOUNTER — Other Ambulatory Visit: Payer: Self-pay | Admitting: Podiatry

## 2019-06-12 ENCOUNTER — Encounter: Payer: Self-pay | Admitting: Family Medicine

## 2019-06-14 ENCOUNTER — Telehealth: Payer: Self-pay | Admitting: Family Medicine

## 2019-06-14 NOTE — Telephone Encounter (Signed)
Attempted to reach patient to check in on symptoms and obtain more information. Unable to gain connection. Will attempt again at a later time.

## 2019-06-15 ENCOUNTER — Encounter: Payer: Self-pay | Admitting: Family Medicine

## 2019-06-16 NOTE — Telephone Encounter (Signed)
Attempted to reach patient to further investigate her symptoms. Unable to reach her. Left message on voicemail. Recommended she schedule a virtual appointment to allow her to better discuss her symptoms and determine the best management. Will await call back.

## 2019-06-18 ENCOUNTER — Encounter: Payer: Self-pay | Admitting: Registered Nurse

## 2019-06-18 ENCOUNTER — Encounter: Payer: Medicaid Other | Attending: Physical Medicine & Rehabilitation | Admitting: Registered Nurse

## 2019-06-18 ENCOUNTER — Other Ambulatory Visit: Payer: Self-pay

## 2019-06-18 VITALS — BP 137/87 | HR 112 | Temp 97.0°F | Ht 72.0 in | Wt 274.2 lb

## 2019-06-18 DIAGNOSIS — G629 Polyneuropathy, unspecified: Secondary | ICD-10-CM | POA: Diagnosis not present

## 2019-06-18 DIAGNOSIS — F341 Dysthymic disorder: Secondary | ICD-10-CM | POA: Insufficient documentation

## 2019-06-18 DIAGNOSIS — E1140 Type 2 diabetes mellitus with diabetic neuropathy, unspecified: Secondary | ICD-10-CM | POA: Diagnosis not present

## 2019-06-18 DIAGNOSIS — M792 Neuralgia and neuritis, unspecified: Secondary | ICD-10-CM

## 2019-06-18 DIAGNOSIS — Z5181 Encounter for therapeutic drug level monitoring: Secondary | ICD-10-CM | POA: Insufficient documentation

## 2019-06-18 DIAGNOSIS — M25562 Pain in left knee: Secondary | ICD-10-CM | POA: Insufficient documentation

## 2019-06-18 DIAGNOSIS — M25561 Pain in right knee: Secondary | ICD-10-CM | POA: Insufficient documentation

## 2019-06-18 DIAGNOSIS — Z79891 Long term (current) use of opiate analgesic: Secondary | ICD-10-CM | POA: Diagnosis not present

## 2019-06-18 DIAGNOSIS — Z79899 Other long term (current) drug therapy: Secondary | ICD-10-CM | POA: Diagnosis not present

## 2019-06-18 DIAGNOSIS — G894 Chronic pain syndrome: Secondary | ICD-10-CM

## 2019-06-18 DIAGNOSIS — M62838 Other muscle spasm: Secondary | ICD-10-CM

## 2019-06-18 MED ORDER — MORPHINE SULFATE 30 MG PO TABS: ORAL_TABLET | ORAL | 0 refills | Status: DC

## 2019-06-18 NOTE — Progress Notes (Signed)
Subjective:    Patient ID: [name], female    DOB: [birth year], 49 y.o.   MRN: [id number]  HPI: [name] is a 49 y.o. female who returns for follow up appointment for chronic pain and medication refill. She states her pain is located in her bilateral hands and bilateral feet. She rates her pain 6. Her current exercise regime is walking.  Ms. Lovingood Morphine equivalent is 120.00 MME.  UDS ordered today.   Pain Inventory Average Pain 6 Pain Right Now 6 My pain is constant, sharp, burning, stabbing and aching  In the last 24 hours, has pain interfered with the following? General activity 8 Relation with others 8 Enjoyment of life 9 What TIME of day is your pain at its worst? evening Sleep (in general) Poor  Pain is worse with: walking Pain improves with: rest and medication Relief from Meds: 8  Mobility walk without assistance walk with assistance use a cane ability to climb steps?  no do you drive?  no  Function disabled: date disabled 2000  Neuro/Psych numbness tingling trouble walking spasms depression anxiety  Prior Studies none  Physicians involved in your care Primary care .   Family History  Problem Relation Age of Onset  . Fibromyalgia Mother   . Aortic aneurysm Mother   . Stroke Maternal Grandmother   . Colon cancer Maternal Grandmother   . Heart Problems Paternal Grandmother   . Dementia Paternal Grandmother   . Heart attack Paternal Grandfather    Social History   Socioeconomic History  . Marital status: Legally Separated    Spouse name: Not on file  . Number of children: 1  . Years of education: Not on file  . Highest education level: Bachelor's degree (e.g., BA, AB, BS)  Occupational History  . Occupation: Disabled  Tobacco Use  . Smoking status: Never Smoker  . Smokeless tobacco: Never Used  Substance and Sexual Activity  . Alcohol use: No    Alcohol/week: 0.0 standard drinks  . Drug use: No    Comment:  rx opiods  . Sexual activity: Yes    Birth control/protection: Condom  Other Topics Concern  . Not on file  Social History Narrative  . Not on file   Social Determinants of Health   Financial Resource Strain:   . Difficulty of Paying Living Expenses:   Food Insecurity:   . Worried About Charity fundraiser in the Last Year:   . Arboriculturist in the Last Year:   Transportation Needs:   . Film/video editor (Medical):   Marland Kitchen Lack of Transportation (Non-Medical):   Physical Activity:   . Days of Exercise per Week:   . Minutes of Exercise per Session:   Stress:   . Feeling of Stress :   Social Connections:   . Frequency of Communication with Friends and Family:   . Frequency of Social Gatherings with Friends and Family:   . Attends Religious Services:   . Active Member of Clubs or Organizations:   . Attends Archivist Meetings:   Marland Kitchen Marital Status:    Past Surgical History:  Procedure Laterality Date  . ABDOMINOPLASTY    . CHOLECYSTECTOMY     Past Medical History:  Diagnosis Date  . DEPRESSION/ANXIETY 08/08/2009  . Diabetes mellitus without complication ([place])   . IBS 02/02/2010  . Neuropathy   . Polyneuropathy 12/04/2010  . Renal stones   . Type II or unspecified type diabetes mellitus with  neurological manifestations, 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) 08/08/2009   Temp (!) 97 F (36.1 C)   Ht 6' (1.829 m)   Wt 274 lb 3.2 oz (124.4 kg)   BMI 37.19 kg/m   Opioid Risk Score:   Fall Risk Score:  `1  Depression screen PHQ 2/9  Depression screen [facility] 2/9 06/05/2019 10/13/2018 05/20/2018 09/10/2017 07/11/2017 06/18/2017 04/24/2017  Decreased Interest 3 2 1 1  0 1 3  Down, Depressed, Hopeless 3 2 1 1  0 1 3  PHQ - 2 Score 6 4 2 2  0 2 6  Altered sleeping 3 - - - - - -  Tired, decreased energy 3 - - - - - -  Change in appetite 0 - - - - - -  Feeling bad or failure about yourself  3 - - - - - -  Trouble concentrating 0 - - - - - -  Moving slowly or  fidgety/restless 0 - - - - - -  Suicidal thoughts 0 - - - - - -  PHQ-9 Score 15 - - - - - -  Difficult doing work/chores Somewhat difficult - - - - - -  Some recent data might be hidden     Review of Systems  All other systems reviewed and are negative.      Objective:   Physical Exam Vitals and nursing note reviewed.  Constitutional:      Appearance: Normal appearance.  Cardiovascular:     Rate and Rhythm: Normal rate and regular rhythm.     Pulses: Normal pulses.     Heart sounds: Normal heart sounds.  Pulmonary:     Effort: Pulmonary effort is normal.     Breath sounds: Normal breath sounds.  Musculoskeletal:     Cervical back: Normal range of motion and neck supple.     Comments: Normal Muscle Bulk and Muscle Testing Reveals:  Upper Extremities: Full ROM and Muscle Strength 5/5  Lower Extremities: Full ROM and Muscle Strength 5/5 Arises from Table with ease Narrow Based  Gait   Skin:    General: Skin is warm and dry.  Neurological:     Mental Status: She is alert and oriented to person, place, and time.  Psychiatric:        Mood and Affect: Mood normal.        Behavior: Behavior normal.           Assessment & Plan:  1.Severe peripheral polyneuropathy of undetermined etiology.:06/18/2019 Refilled: MS Contin30 mg one tablet every 12 hours #60 andcontinueMSIR 30mg  one tablet at HS, may take a tablet during the day as needed #60.  We will continue the opioid monitoring program, this consists of regular clinic visits, examinations, urine drug screen, pill counts as well as use of [place] Controlled Substance Reporting System. 2. Bilateral progressive upper extremity hand pain/ Polyneuropathy.Continue with current medication regime. Tight Glucose Control. Adhere to healthy diet regime.Continue with current medication regimen withnortriptyline:06/18/2019 3. Muscle Spasms: Continuecurrent medication regimen withZanaflex. 06/18/2019 4.  Depression/Anxiety: Continuecurrent medication regimen withPaxil and Klonopin.06/18/2019 5. Bilateral Chronic Knee Pain: No complaints Today.Continue HEP as Tolerated and Continue to Monitor.06/18/2019. 6. Right Diabetic Foot Ulcer: Podiatry Following. Wound CenterFollowing[facility] and Vascular.06/18/2019 7. Left-sided Low Back Pain:No complaints today.Continue current medication regimen. Continue to monitor.06/18/2019.  F/U in 1 month  110minutes of face to face patient care time was spent during this visit. All questions were encouraged and answered.

## 2019-06-19 ENCOUNTER — Other Ambulatory Visit: Payer: Medicaid Other

## 2019-06-19 ENCOUNTER — Ambulatory Visit: Payer: Medicaid Other

## 2019-06-22 LAB — TOXASSURE SELECT,+ANTIDEPR,UR

## 2019-06-24 DIAGNOSIS — E113293 Type 2 diabetes mellitus with mild nonproliferative diabetic retinopathy without macular edema, bilateral: Secondary | ICD-10-CM | POA: Diagnosis not present

## 2019-06-26 ENCOUNTER — Other Ambulatory Visit: Payer: Medicaid Other

## 2019-06-29 ENCOUNTER — Telehealth: Payer: Self-pay | Admitting: *Deleted

## 2019-06-29 NOTE — Telephone Encounter (Signed)
Urine drug screen for this encounter is consistent for prescribed medication 

## 2019-06-30 ENCOUNTER — Telehealth: Payer: Self-pay | Admitting: *Deleted

## 2019-06-30 NOTE — Telephone Encounter (Signed)
Pt NS her 330 pm PV- called pt at 344- no answer- LM to return call by 5 pm today to RS or her PV and colon would be canceled No call or RS by 5 pm CAnceled PV and Colon Mailed NS letter

## 2019-07-14 ENCOUNTER — Encounter: Payer: Medicaid Other | Admitting: Registered Nurse

## 2019-07-15 ENCOUNTER — Encounter: Payer: Medicaid Other | Admitting: Gastroenterology

## 2019-07-16 ENCOUNTER — Encounter: Payer: Self-pay | Admitting: Registered Nurse

## 2019-07-16 ENCOUNTER — Encounter: Payer: Self-pay | Admitting: Family Medicine

## 2019-07-16 ENCOUNTER — Other Ambulatory Visit: Payer: Self-pay

## 2019-07-16 ENCOUNTER — Other Ambulatory Visit: Payer: Self-pay | Admitting: Family Medicine

## 2019-07-16 ENCOUNTER — Encounter: Payer: Medicaid Other | Attending: Physical Medicine & Rehabilitation | Admitting: Registered Nurse

## 2019-07-16 VITALS — BP 111/66 | HR 98 | Temp 97.5°F | Ht 72.0 in | Wt 283.0 lb

## 2019-07-16 DIAGNOSIS — G629 Polyneuropathy, unspecified: Secondary | ICD-10-CM

## 2019-07-16 DIAGNOSIS — G894 Chronic pain syndrome: Secondary | ICD-10-CM

## 2019-07-16 DIAGNOSIS — F341 Dysthymic disorder: Secondary | ICD-10-CM

## 2019-07-16 DIAGNOSIS — M25562 Pain in left knee: Secondary | ICD-10-CM | POA: Insufficient documentation

## 2019-07-16 DIAGNOSIS — Z5181 Encounter for therapeutic drug level monitoring: Secondary | ICD-10-CM | POA: Diagnosis not present

## 2019-07-16 DIAGNOSIS — Z79891 Long term (current) use of opiate analgesic: Secondary | ICD-10-CM | POA: Diagnosis not present

## 2019-07-16 DIAGNOSIS — M62838 Other muscle spasm: Secondary | ICD-10-CM

## 2019-07-16 DIAGNOSIS — E118 Type 2 diabetes mellitus with unspecified complications: Secondary | ICD-10-CM

## 2019-07-16 DIAGNOSIS — E1140 Type 2 diabetes mellitus with diabetic neuropathy, unspecified: Secondary | ICD-10-CM

## 2019-07-16 DIAGNOSIS — M25561 Pain in right knee: Secondary | ICD-10-CM | POA: Diagnosis not present

## 2019-07-16 DIAGNOSIS — Z79899 Other long term (current) drug therapy: Secondary | ICD-10-CM | POA: Diagnosis not present

## 2019-07-16 DIAGNOSIS — M792 Neuralgia and neuritis, unspecified: Secondary | ICD-10-CM

## 2019-07-16 MED ORDER — GLIPIZIDE 10 MG PO TABS: ORAL_TABLET | ORAL | 2 refills | Status: DC

## 2019-07-16 MED ORDER — MORPHINE SULFATE 30 MG PO TABS: ORAL_TABLET | ORAL | 0 refills | Status: DC

## 2019-07-16 MED ORDER — MORPHINE SULFATE ER 30 MG PO TBCR: 30.0000 mg | EXTENDED_RELEASE_TABLET | Freq: Two times a day (BID) | ORAL | 0 refills | Status: DC

## 2019-07-16 NOTE — Telephone Encounter (Signed)
Spoke to patient to further discuss diabetes management.  She has endorsed intolerance to SGLT2 recurrent yeast infections.  She would like to discontinue this medication.  Patient currently on Metformin and GLP-1.  Will restart glipizide at this time given she has been tolerant to this medication in the past.  Follow-up scheduled for 4/27.

## 2019-07-16 NOTE — Progress Notes (Signed)
Subjective:    Patient ID: [name], female    DOB: 1971-02-23, 49 y.o.   MRN: [id number]  HPI: [name] is a 49 y.o. female who returns for follow up appointment for chronic pain and medication refill. She states her pain is located in her bilateral hands and bilateral feet with tingling and burning. She rates her pain 7. Her current exercise regime is walking  Also reports she bought a recumbent bicycle will began using it on Saturday.   Ms. Hausch Morphine equivalent is 120.00  MME.  She  is also prescribed Clonazepam. We have discussed the black box warning of using opioids and benzodiazepines. I highlighted the dangers of using these drugs together and discussed the adverse events including respiratory suppression, overdose, cognitive impairment and importance of compliance with current regimen. We will continue to monitor and adjust as indicated.   Last UDS was Performed on 06/18/2019, it was consistent.     Pain Inventory Average Pain 7 Pain Right Now 7 My pain is constant, sharp, burning, dull, stabbing, tingling and aching  In the last 24 hours, has pain interfered with the following? General activity 9 Relation with others 9 Enjoyment of life 9 What TIME of day is your pain at its worst? daytime Sleep (in general) Poor  Pain is worse with: walking, bending and standing Pain improves with: rest and medication Relief from Meds: 7  Mobility walk without assistance walk with assistance use a cane how many minutes can you walk? 10 ability to climb steps?  no do you drive?  no Do you have any goals in this area?  yes  Function disabled: date disabled . I need assistance with the following:  meal prep, household duties and shopping  Neuro/Psych weakness numbness tremor tingling trouble walking spasms dizziness depression anxiety  Prior Studies Any changes since last visit?  no  Physicians involved in your care Any changes since  last visit?  no   Family History  Problem Relation Age of Onset  . Fibromyalgia Mother   . Aortic aneurysm Mother   . Stroke Maternal Grandmother   . Colon cancer Maternal Grandmother   . Heart Problems Paternal Grandmother   . Dementia Paternal Grandmother   . Heart attack Paternal Grandfather    Social History   Socioeconomic History  . Marital status: Legally Separated    Spouse name: Not on file  . Number of children: 1  . Years of education: Not on file  . Highest education level: Bachelor's degree (e.g., BA, AB, BS)  Occupational History  . Occupation: Disabled  Tobacco Use  . Smoking status: Never Smoker  . Smokeless tobacco: Never Used  Substance and Sexual Activity  . Alcohol use: No    Alcohol/week: 0.0 standard drinks  . Drug use: No    Comment: rx opiods  . Sexual activity: Yes    Birth control/protection: Condom  Other Topics Concern  . Not on file  Social History Narrative  . Not on file   Social Determinants of Health   Financial Resource Strain:   . Difficulty of Paying Living Expenses:   Food Insecurity:   . Worried About Charity fundraiser in the Last Year:   . Arboriculturist in the Last Year:   Transportation Needs:   . Film/video editor (Medical):   Marland Kitchen Lack of Transportation (Non-Medical):   Physical Activity:   . Days of Exercise per Week:   . Minutes of Exercise  per Session:   Stress:   . Feeling of Stress :   Social Connections:   . Frequency of Communication with Friends and Family:   . Frequency of Social Gatherings with Friends and Family:   . Attends Religious Services:   . Active Member of Clubs or Organizations:   . Attends Archivist Meetings:   Marland Kitchen Marital Status:    Past Surgical History:  Procedure Laterality Date  . ABDOMINOPLASTY    . CHOLECYSTECTOMY     Past Medical History:  Diagnosis Date  . DEPRESSION/ANXIETY 08/08/2009  . Diabetes mellitus without complication ([place])   . IBS 02/02/2010  .  Neuropathy   . Polyneuropathy 12/04/2010  . Renal stones   . 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) 08/08/2009   BP 111/66   Pulse 98   Temp (!) 97.5 F (36.4 C)   Ht 6' (1.829 m)   Wt 283 lb (128.4 kg)   SpO2 97%   BMI 38.38 kg/m   Opioid Risk Score:   Fall Risk Score:  `1  Depression screen PHQ 2/9  Depression screen [facility] 2/9 06/05/2019 10/13/2018 05/20/2018 09/10/2017 07/11/2017 06/18/2017 04/24/2017  Decreased Interest 3 2 1 1  0 1 3  Down, Depressed, Hopeless 3 2 1 1  0 1 3  PHQ - 2 Score 6 4 2 2  0 2 6  Altered sleeping 3 - - - - - -  Tired, decreased energy 3 - - - - - -  Change in appetite 0 - - - - - -  Feeling bad or failure about yourself  3 - - - - - -  Trouble concentrating 0 - - - - - -  Moving slowly or fidgety/restless 0 - - - - - -  Suicidal thoughts 0 - - - - - -  PHQ-9 Score 15 - - - - - -  Difficult doing work/chores Somewhat difficult - - - - - -  Some recent data might be hidden    Review of Systems  Constitutional: Positive for diaphoresis.  HENT: Negative.   Eyes: Negative.   Respiratory: Positive for apnea.   Cardiovascular: Negative.   Gastrointestinal: Positive for abdominal pain, constipation and diarrhea.  Endocrine:       High blood sugar  Musculoskeletal: Negative.        Spasms   Skin: Negative.   Allergic/Immunologic: Negative.   Neurological: Positive for tremors, weakness and numbness.       Tingling  Psychiatric/Behavioral: Positive for dysphoric mood. The patient is nervous/anxious.   All other systems reviewed and are negative.      Objective:   Physical Exam Vitals and nursing note reviewed.  Constitutional:      Appearance: Normal appearance. She is obese.  Cardiovascular:     Rate and Rhythm: Normal rate and regular rhythm.     Pulses: Normal pulses.     Heart sounds: Normal heart sounds.  Pulmonary:     Effort: Pulmonary effort is normal.      Breath sounds: Normal breath sounds.  Musculoskeletal:     Cervical back: Normal range of motion and neck supple.     Comments: Normal Muscle Bulk and Muscle Testing Reveals:  Upper Extremities: Full ROM and Muscle Strength 5/5  Lower Extremities: Full ROM and Muscle Strength 5/5 Arises from Table with ease using cane for support Narrow Based Gait   Skin:    General: Skin is warm and dry.  Neurological:     Mental Status: She is alert and oriented to person, place, and time.  Psychiatric:        Mood and Affect: Mood normal.        Behavior: Behavior normal.           Assessment & Plan:  1.Severe peripheral polyneuropathy of undetermined etiology.:07/16/2019 Refilled: MS Contin30 mg one tablet every 12 hours #60 andcontinueMSIR 30mg  one tablet at HS, may take a tablet during the day as needed #60.  We will continue the opioid monitoring program, this consists of regular clinic visits, examinations, urine drug screen, pill counts as well as use of [place] Controlled Substance Reporting System. 2. Bilateral progressive upper extremity hand pain/ Polyneuropathy.Continue with current medication regime. Tight Glucose Control. Adhere to healthy diet regime.Continue with current medication regimen withnortriptyline:07/16/2019 3. Muscle Spasms: Continuecurrent medication regimen withZanaflex. 07/16/2019 4. Depression/Anxiety: Continuecurrent medication regimen withPaxil and Klonopin.07/16/2019 5. Bilateral Chronic Knee Pain: No complaints Today.Continue HEP as Tolerated and Continue to Monitor.07/16/2019. 6. Right Diabetic Foot Ulcer: No complaints today. Podiatry Following. Wound CenterFollowingon and Vascular.07/16/2019 7. Left-sided Low Back Pain:No complaints today.Continue current medication regimen. Continue to monitor.07/16/2019.  F/U in 1 month  76minutes of face to face patient care time was spent during this visit. All questions were  encouraged and answered.

## 2019-07-21 ENCOUNTER — Other Ambulatory Visit: Payer: Self-pay

## 2019-07-21 ENCOUNTER — Telehealth (INDEPENDENT_AMBULATORY_CARE_PROVIDER_SITE_OTHER): Payer: Medicaid Other | Admitting: Family Medicine

## 2019-07-21 DIAGNOSIS — E11319 Type 2 diabetes mellitus with unspecified diabetic retinopathy without macular edema: Secondary | ICD-10-CM

## 2019-07-21 DIAGNOSIS — E118 Type 2 diabetes mellitus with unspecified complications: Secondary | ICD-10-CM | POA: Diagnosis not present

## 2019-07-21 NOTE — Progress Notes (Signed)
[place] Telemedicine Visit  Patient consented to have virtual visit and was identified by name and date of birth. Method of visit: Video  Encounter participants: Patient: [name] - located at home Provider: Danna Hefty - located at [facility] Others (if applicable): None  Chief Complaint: Diabetes follow up  HPI:  Diabetes: Last three A1C's below. Currently on glipizide 10 mg daily, liraglutide 1.8 mg daily, Metformin at 1000 mg twice daily.  Was intolerant to SGLT2 inhibitors secondary to frequent and recurrent yeast infections.  She was restarted on her previous glipizide as she was very tolerant to this prior. Endorses compliance. Denies any hypoglycemia. Denies any polyuria, polydipsia, polyphagia. Due for diabetic eye exam. Patient notes she just bought an exercise bike and planning on starting to use it more. She notes she is vegetarian and often resorts to pasta which she knows is something she needs to cut back on.   Lab Results  Component Value Date   HGBA1C 8.3 (A) 05/08/2019   HGBA1C 7.5 (A) 09/23/2018   HGBA1C 8.6 07/11/2017   Health Maintenance: Due for mammogram and colonoscopy, COVID19 vaccine and Pap smear.  Due for diabetic eye exam Patient has significant anxiety that hinders her from undergoing invasive procedures.    ROS: per HPI  Pertinent PMHx: type 2 diabetes, anxiety   Exam:  Respiratory: breathing comfortably on room air, speaking in full sentences  Assessment/Plan:  Type 2 diabetes mellitus with complication, without long-term current use of insulin (HCC) Follow-up for diabetes after recent change from SGLT2 back to glipizide due to intolerance to both [place].  She endorses tolerance without any hypoglycemic symptoms.  He has been having trouble with her glucose meter and plans to look into it.  Recommended intermittent CBG checks; daily monitoring is not indicated.  Discussed lifestyle modifications  including weight loss and exercise.  Patient is motivated.  Patient to get COVID-19 vaccine soon.   -Continue Metformin 1000 mg twice daily, liraglutide, and glipizide -Patient to schedule diabetic eye exam at earliest convenience (plan to schedule after fully vaccinated) -Continue statin and ACE inhibitor as prescribed -Plan to follow-up in June 2021 for repeat A1c -Encourage diet and exercise to help with weight loss and cardiovascular health   Health Maintenance: Due for mammogram and colonoscopy, COVID19 vaccine and Pap smear. Patient has significant anxiety that hinders her from undergoing more invasive procedures.  She is planning on scheduling her COVID vaccine soon. She would like to wait on the other health maintenance requirements for when she is fully vaccinated. Will continue to recommend and counsel.  Time spent during visit with patient: 20 minutes  Mina Marble, [place], PGY2 07/22/2019 2:50 PM

## 2019-07-22 NOTE — Assessment & Plan Note (Addendum)
Follow-up for diabetes after recent change from SGLT2 back to glipizide due to intolerance to both [place].  She endorses tolerance without any hypoglycemic symptoms.  He has been having trouble with her glucose meter and plans to look into it.  Recommended intermittent CBG checks; daily monitoring is not indicated.  Discussed lifestyle modifications including weight loss and exercise.  Patient is motivated.  Patient to get COVID-19 vaccine soon.   -Continue Metformin 1000 mg twice daily, liraglutide, and glipizide -Patient to schedule diabetic eye exam at earliest convenience (plan to schedule after fully vaccinated) -Continue statin and ACE inhibitor as prescribed -Plan to follow-up in June 2021 for repeat A1c -Encourage diet and exercise to help with weight loss and cardiovascular health

## 2019-07-28 ENCOUNTER — Other Ambulatory Visit: Payer: Self-pay | Admitting: Physical Medicine & Rehabilitation

## 2019-07-30 NOTE — Telephone Encounter (Signed)
Opened in error

## 2019-08-11 ENCOUNTER — Encounter: Payer: Medicaid Other | Attending: Physical Medicine & Rehabilitation | Admitting: Registered Nurse

## 2019-08-11 ENCOUNTER — Encounter: Payer: Self-pay | Admitting: Registered Nurse

## 2019-08-11 ENCOUNTER — Other Ambulatory Visit: Payer: Self-pay

## 2019-08-11 VITALS — BP 132/83 | HR 101 | Temp 98.7°F | Ht 72.0 in | Wt 280.4 lb

## 2019-08-11 DIAGNOSIS — G894 Chronic pain syndrome: Secondary | ICD-10-CM | POA: Insufficient documentation

## 2019-08-11 DIAGNOSIS — Z79891 Long term (current) use of opiate analgesic: Secondary | ICD-10-CM | POA: Diagnosis not present

## 2019-08-11 DIAGNOSIS — M25561 Pain in right knee: Secondary | ICD-10-CM | POA: Diagnosis not present

## 2019-08-11 DIAGNOSIS — F341 Dysthymic disorder: Secondary | ICD-10-CM | POA: Diagnosis not present

## 2019-08-11 DIAGNOSIS — G629 Polyneuropathy, unspecified: Secondary | ICD-10-CM

## 2019-08-11 DIAGNOSIS — M25562 Pain in left knee: Secondary | ICD-10-CM | POA: Diagnosis not present

## 2019-08-11 DIAGNOSIS — E1140 Type 2 diabetes mellitus with diabetic neuropathy, unspecified: Secondary | ICD-10-CM

## 2019-08-11 DIAGNOSIS — Z79899 Other long term (current) drug therapy: Secondary | ICD-10-CM | POA: Insufficient documentation

## 2019-08-11 DIAGNOSIS — Z5181 Encounter for therapeutic drug level monitoring: Secondary | ICD-10-CM | POA: Diagnosis not present

## 2019-08-11 DIAGNOSIS — Z23 Encounter for immunization: Secondary | ICD-10-CM | POA: Diagnosis not present

## 2019-08-11 MED ORDER — MORPHINE SULFATE ER 30 MG PO TBCR: 30.0000 mg | EXTENDED_RELEASE_TABLET | Freq: Two times a day (BID) | ORAL | 0 refills | Status: DC

## 2019-08-11 MED ORDER — MORPHINE SULFATE 30 MG PO TABS: ORAL_TABLET | ORAL | 0 refills | Status: DC

## 2019-08-11 NOTE — Progress Notes (Signed)
Subjective:    Patient ID: [name], female    DOB: 10-28-1970, 49 y.o.   MRN: [id number]  HPI: [name] is a 49 y.o. female who returns for follow up appointment for chronic pain and medication refill. She states her pain is located in her bilateral hands, bilateral lower extremities and bilateral feet with tingling and burning. She rates her pain 7. Her current exercise regime is walking and riding her stationary bicycle QOD for 10 minutes.   Ms. Plock Morphine equivalent is 120.00 MME.  She  is also prescribed  Clonazepam We have discussed the black box warning of using opioids and benzodiazepines. I highlighted the dangers of using these drugs together and discussed the adverse events including respiratory suppression, overdose, cognitive impairment and importance of compliance with current regimen. We will continue to monitor and adjust as indicated.    Last UDS was Performed on 06/18/2019, it was consistent.     Pain Inventory Average Pain 6 Pain Right Now 7 My pain is constant, sharp, burning, dull, stabbing, tingling and aching  In the last 24 hours, has pain interfered with the following? General activity 7 Relation with others 7 Enjoyment of life 7 What TIME of day is your pain at its worst? daytime Sleep (in general) Poor  Pain is worse with: walking, bending and standing Pain improves with: rest and medication Relief from Meds: 7  Mobility use a cane ability to climb steps?  no do you drive?  no  Function disabled: date disabled . I need assistance with the following:  household duties and shopping  Neuro/Psych weakness numbness tremor tingling trouble walking spasms dizziness depression anxiety  Prior Studies Any changes since last visit?  no  Physicians involved in your care Any changes since last visit?  no   Family History  Problem Relation Age of Onset  . Fibromyalgia Mother   . Aortic aneurysm Mother   . Stroke  Maternal Grandmother   . Colon cancer Maternal Grandmother   . Heart Problems Paternal Grandmother   . Dementia Paternal Grandmother   . Heart attack Paternal Grandfather    Social History   Socioeconomic History  . Marital status: Legally Separated    Spouse name: Not on file  . Number of children: 1  . Years of education: Not on file  . Highest education level: Bachelor's degree (e.g., BA, AB, BS)  Occupational History  . Occupation: Disabled  Tobacco Use  . Smoking status: Never Smoker  . Smokeless tobacco: Never Used  Substance and Sexual Activity  . Alcohol use: No    Alcohol/week: 0.0 standard drinks  . Drug use: No    Comment: rx opiods  . Sexual activity: Yes    Birth control/protection: Condom  Other Topics Concern  . Not on file  Social History Narrative  . Not on file   Social Determinants of Health   Financial Resource Strain:   . Difficulty of Paying Living Expenses:   Food Insecurity:   . Worried About Charity fundraiser in the Last Year:   . Arboriculturist in the Last Year:   Transportation Needs:   . Film/video editor (Medical):   Marland Kitchen Lack of Transportation (Non-Medical):   Physical Activity:   . Days of Exercise per Week:   . Minutes of Exercise per Session:   Stress:   . Feeling of Stress :   Social Connections:   . Frequency of Communication with Friends and Family:   .  Frequency of Social Gatherings with Friends and Family:   . Attends Religious Services:   . Active Member of Clubs or Organizations:   . Attends Archivist Meetings:   Marland Kitchen Marital Status:    Past Surgical History:  Procedure Laterality Date  . ABDOMINOPLASTY    . CHOLECYSTECTOMY     Past Medical History:  Diagnosis Date  . DEPRESSION/ANXIETY 08/08/2009  . Diabetes mellitus without complication ([place])   . IBS 02/02/2010  . Neuropathy   . Polyneuropathy 12/04/2010  . Renal stones   . Type II or unspecified type diabetes mellitus with neurological  manifestations, 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) 08/08/2009   BP 132/83   Pulse (!) 101   Temp 98.7 F (37.1 C)   Ht 6' (1.829 m)   Wt 280 lb 6.4 oz (127.2 kg)   SpO2 99%   BMI 38.03 kg/m   Opioid Risk Score:   Fall Risk Score:  `1  Depression screen PHQ 2/9  Depression screen [facility] 2/9 06/05/2019 10/13/2018 05/20/2018 09/10/2017 07/11/2017 06/18/2017 04/24/2017  Decreased Interest 3 2 1 1  0 1 3  Down, Depressed, Hopeless 3 2 1 1  0 1 3  PHQ - 2 Score 6 4 2 2  0 2 6  Altered sleeping 3 - - - - - -  Tired, decreased energy 3 - - - - - -  Change in appetite 0 - - - - - -  Feeling bad or failure about yourself  3 - - - - - -  Trouble concentrating 0 - - - - - -  Moving slowly or fidgety/restless 0 - - - - - -  Suicidal thoughts 0 - - - - - -  PHQ-9 Score 15 - - - - - -  Difficult doing work/chores Somewhat difficult - - - - - -  Some recent data might be hidden     Review of Systems  Neurological: Positive for dizziness, tremors, weakness and numbness.  Psychiatric/Behavioral: Positive for dysphoric mood. The patient is nervous/anxious.   All other systems reviewed and are negative.      Objective:   Physical Exam Vitals and nursing note reviewed.  Constitutional:      Appearance: Normal appearance. She is obese.  Cardiovascular:     Rate and Rhythm: Normal rate and regular rhythm.     Pulses: Normal pulses.     Heart sounds: Normal heart sounds.  Pulmonary:     Effort: Pulmonary effort is normal.     Breath sounds: Normal breath sounds.  Musculoskeletal:     Cervical back: Normal range of motion and neck supple.     Comments: Normal Muscle Bulk and Muscle Testing Reveals:  Upper Extremities: Full ROM and Muscle Strength 5/5 Lower Extremities: Full ROM and Muscle Strength 5/5 Arises from table with ease Narrow Based  Gait   Skin:    General: Skin is warm and dry.  Neurological:     Mental Status: She is alert and oriented to person, place, and time.  Psychiatric:          Mood and Affect: Mood normal.        Behavior: Behavior normal.           Assessment & Plan:  1.Severe peripheral polyneuropathy of undetermined etiology.:08/11/2019 Refilled: MS Contin30 mg one tablet every 12 hours #60 andcontinueMSIR 30mg  one tablet at HS, may take a tablet during the day as needed #60.  We will continue the opioid monitoring program, this consists of  regular clinic visits, examinations, urine drug screen, pill counts as well as use of [place] Controlled Substance Reporting System. 2. Bilateral progressive upper extremity hand pain/ Polyneuropathy.Continue with current medication regime. Tight Glucose Control. Adhere to healthy diet regime.Continue with current medication regimen withnortriptyline:08/11/2019 3. Muscle Spasms: Continuecurrent medication regimen withZanaflex. 08/11/2019 4. Depression/Anxiety: Continuecurrent medication regimen withPaxil and Klonopin.08/11/2019 5. Bilateral Chronic Knee Pain: No complaints Today.Continue HEP as Tolerated and Continue to Monitor.08/11/2019. 6. Right Diabetic Foot Ulcer: No complaints today. Podiatry Following. [facility] CenterFollowing[facility].08/11/2019 7. Left-sided Low Back Pain:No complaints today.Continue current medication regimen. Continue to monitor.08/11/2019.  F/U in 1 month  72minutes of face to face patient care time was spent during this visit. All questions were encouraged and answered.

## 2019-08-13 ENCOUNTER — Other Ambulatory Visit: Payer: Self-pay | Admitting: Registered Nurse

## 2019-08-13 ENCOUNTER — Other Ambulatory Visit: Payer: Self-pay | Admitting: *Deleted

## 2019-08-13 DIAGNOSIS — E781 Pure hyperglyceridemia: Secondary | ICD-10-CM

## 2019-08-13 MED ORDER — FENOFIBRATE 145 MG PO TABS: 145.0000 mg | ORAL_TABLET | Freq: Every day | ORAL | 2 refills | Status: DC

## 2019-08-22 ENCOUNTER — Encounter: Payer: Self-pay | Admitting: Family Medicine

## 2019-09-08 ENCOUNTER — Other Ambulatory Visit: Payer: Self-pay | Admitting: Registered Nurse

## 2019-09-08 DIAGNOSIS — F341 Dysthymic disorder: Secondary | ICD-10-CM

## 2019-09-08 DIAGNOSIS — Z23 Encounter for immunization: Secondary | ICD-10-CM | POA: Diagnosis not present

## 2019-09-11 ENCOUNTER — Ambulatory Visit: Payer: Medicaid Other | Admitting: Registered Nurse

## 2019-09-11 ENCOUNTER — Other Ambulatory Visit: Payer: Self-pay

## 2019-09-11 DIAGNOSIS — E119 Type 2 diabetes mellitus without complications: Secondary | ICD-10-CM

## 2019-09-11 MED ORDER — VICTOZA 18 MG/3ML ~~LOC~~ SOPN: PEN_INJECTOR | SUBCUTANEOUS | 3 refills | Status: DC

## 2019-09-14 ENCOUNTER — Other Ambulatory Visit: Payer: Self-pay

## 2019-09-14 ENCOUNTER — Telehealth: Payer: Self-pay | Admitting: Registered Nurse

## 2019-09-14 DIAGNOSIS — E781 Pure hyperglyceridemia: Secondary | ICD-10-CM

## 2019-09-14 MED ORDER — ENALAPRIL MALEATE 10 MG PO TABS: ORAL_TABLET | ORAL | 2 refills | Status: DC

## 2019-09-14 MED ORDER — MORPHINE SULFATE ER 30 MG PO TBCR: 30.0000 mg | EXTENDED_RELEASE_TABLET | Freq: Two times a day (BID) | ORAL | 0 refills | Status: DC

## 2019-09-14 MED ORDER — FENOFIBRATE 145 MG PO TABS: 145.0000 mg | ORAL_TABLET | Freq: Every day | ORAL | 2 refills | Status: DC

## 2019-09-14 MED ORDER — PAROXETINE HCL 40 MG PO TABS: ORAL_TABLET | ORAL | 2 refills | Status: DC

## 2019-09-14 NOTE — Telephone Encounter (Signed)
PMP was Reviewed. MS Contin e-scribed today. [name] aware via My-Chart Message.

## 2019-09-15 ENCOUNTER — Other Ambulatory Visit: Payer: Self-pay | Admitting: Family Medicine

## 2019-09-15 ENCOUNTER — Other Ambulatory Visit: Payer: Self-pay | Admitting: *Deleted

## 2019-09-15 DIAGNOSIS — E781 Pure hyperglyceridemia: Secondary | ICD-10-CM

## 2019-09-16 ENCOUNTER — Other Ambulatory Visit: Payer: Medicaid Other

## 2019-09-18 ENCOUNTER — Encounter: Payer: Medicaid Other | Admitting: Registered Nurse

## 2019-09-21 ENCOUNTER — Ambulatory Visit: Payer: Medicaid Other | Admitting: Obstetrics

## 2019-09-24 ENCOUNTER — Encounter: Payer: Medicaid Other | Attending: Physical Medicine & Rehabilitation | Admitting: Registered Nurse

## 2019-09-24 ENCOUNTER — Other Ambulatory Visit: Payer: Self-pay

## 2019-09-24 ENCOUNTER — Encounter: Payer: Self-pay | Admitting: Registered Nurse

## 2019-09-24 VITALS — BP 126/90 | HR 102 | Temp 98.6°F | Ht 72.0 in | Wt 287.0 lb

## 2019-09-24 DIAGNOSIS — F341 Dysthymic disorder: Secondary | ICD-10-CM | POA: Insufficient documentation

## 2019-09-24 DIAGNOSIS — Z79899 Other long term (current) drug therapy: Secondary | ICD-10-CM | POA: Insufficient documentation

## 2019-09-24 DIAGNOSIS — E1140 Type 2 diabetes mellitus with diabetic neuropathy, unspecified: Secondary | ICD-10-CM

## 2019-09-24 DIAGNOSIS — Z5181 Encounter for therapeutic drug level monitoring: Secondary | ICD-10-CM | POA: Insufficient documentation

## 2019-09-24 DIAGNOSIS — M25562 Pain in left knee: Secondary | ICD-10-CM | POA: Diagnosis present

## 2019-09-24 DIAGNOSIS — M25561 Pain in right knee: Secondary | ICD-10-CM | POA: Insufficient documentation

## 2019-09-24 DIAGNOSIS — G629 Polyneuropathy, unspecified: Secondary | ICD-10-CM

## 2019-09-24 DIAGNOSIS — Z79891 Long term (current) use of opiate analgesic: Secondary | ICD-10-CM | POA: Insufficient documentation

## 2019-09-24 DIAGNOSIS — G894 Chronic pain syndrome: Secondary | ICD-10-CM | POA: Diagnosis not present

## 2019-09-24 MED ORDER — MORPHINE SULFATE 30 MG PO TABS: ORAL_TABLET | ORAL | 0 refills | Status: DC

## 2019-09-24 MED ORDER — MORPHINE SULFATE ER 30 MG PO TBCR: 30.0000 mg | EXTENDED_RELEASE_TABLET | Freq: Two times a day (BID) | ORAL | 0 refills | Status: DC

## 2019-09-24 NOTE — Progress Notes (Signed)
Subjective:    Patient ID: [name], female    DOB: 08-12-1970, 49 y.o.   MRN: [id number]  HPI: [name] is a 49 y.o. female who returns for follow up appointment for chronic pain and medication refill. She states her pain is located in her bilateral hands and bilateral feet with numbness and tingling. She rates her pain 6. Her current exercise regime is walking and performing stretching exercises.  Ms. Walsworth Morphine equivalent is 120.00  MME. She is also prescribed Clonazepam.We have discussed the black box warning of using opioids and benzodiazepines. I highlighted the dangers of using these drugs together and discussed the adverse events including respiratory suppression, overdose, cognitive impairment and importance of compliance with current regimen. We will continue to monitor and adjust as indicated.   Last UDS was Performed on 06/18/2019, it was consistent.    Pain Inventory Average Pain 7 Pain Right Now 6 My pain is constant, sharp, burning, dull, stabbing, tingling and aching  In the last 24 hours, has pain interfered with the following? General activity 9 Relation with others 7 Enjoyment of life 7 What TIME of day is your pain at its worst? evening Sleep (in general) Poor  Pain is worse with: walking, bending, standing and some activites Pain improves with: rest, therapy/exercise and medication Relief from Meds: 6  Mobility walk with assistance use a cane  Function disabled: date disabled .  Neuro/Psych weakness numbness tremor tingling trouble walking spasms dizziness depression anxiety  Prior Studies Any changes since last visit?  no  Physicians involved in your care Any changes since last visit?  no   Family History  Problem Relation Age of Onset  . Fibromyalgia Mother   . Aortic aneurysm Mother   . Stroke Maternal Grandmother   . Colon cancer Maternal Grandmother   . Heart Problems Paternal Grandmother   .  Dementia Paternal Grandmother   . Heart attack Paternal Grandfather    Social History   Socioeconomic History  . Marital status: Legally Separated    Spouse name: Not on file  . Number of children: 1  . Years of education: Not on file  . Highest education level: Bachelor's degree (e.g., BA, AB, BS)  Occupational History  . Occupation: Disabled  Tobacco Use  . Smoking status: Never Smoker  . Smokeless tobacco: Never Used  Vaping Use  . Vaping Use: Never used  Substance and Sexual Activity  . Alcohol use: No    Alcohol/week: 0.0 standard drinks  . Drug use: No    Comment: rx opiods  . Sexual activity: Yes    Birth control/protection: Condom  Other Topics Concern  . Not on file  Social History Narrative  . Not on file   Social Determinants of Health   Financial Resource Strain:   . Difficulty of Paying Living Expenses:   Food Insecurity:   . Worried About Charity fundraiser in the Last Year:   . Arboriculturist in the Last Year:   Transportation Needs:   . Film/video editor (Medical):   Marland Kitchen Lack of Transportation (Non-Medical):   Physical Activity:   . Days of Exercise per Week:   . Minutes of Exercise per Session:   Stress:   . Feeling of Stress :   Social Connections:   . Frequency of Communication with Friends and Family:   . Frequency of Social Gatherings with Friends and Family:   . Attends Religious Services:   . Active  Member of Clubs or Organizations:   . Attends Archivist Meetings:   Marland Kitchen Marital Status:    Past Surgical History:  Procedure Laterality Date  . ABDOMINOPLASTY    . CHOLECYSTECTOMY     Past Medical History:  Diagnosis Date  . DEPRESSION/ANXIETY 08/08/2009  . Diabetes mellitus without complication ([place])   . IBS 02/02/2010  . Neuropathy   . Polyneuropathy 12/04/2010  . Renal stones   . 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) 08/08/2009   BP 126/90    Pulse (!) 102   Temp 98.6 F (37 C)   Ht 6' (1.829 m)   Wt 287 lb (130.2 kg)   SpO2 98%   BMI 38.92 kg/m   Opioid Risk Score:   Fall Risk Score:  `1  Depression screen PHQ 2/9  Depression screen [facility] 2/9 06/05/2019 10/13/2018 05/20/2018 09/10/2017 07/11/2017 06/18/2017 04/24/2017  Decreased Interest 3 2 1 1  0 1 3  Down, Depressed, Hopeless 3 2 1 1  0 1 3  PHQ - 2 Score 6 4 2 2  0 2 6  Altered sleeping 3 - - - - - -  Tired, decreased energy 3 - - - - - -  Change in appetite 0 - - - - - -  Feeling bad or failure about yourself  3 - - - - - -  Trouble concentrating 0 - - - - - -  Moving slowly or fidgety/restless 0 - - - - - -  Suicidal thoughts 0 - - - - - -  PHQ-9 Score 15 - - - - - -  Difficult doing work/chores Somewhat difficult - - - - - -  Some recent data might be hidden    Review of Systems  Constitutional: Negative.   HENT: Negative.   Eyes: Negative.   Respiratory: Negative.   Cardiovascular: Negative.   Gastrointestinal: Negative.   Endocrine: Negative.   Genitourinary: Negative.   Musculoskeletal: Positive for gait problem and myalgias.  Skin: Negative.   Allergic/Immunologic: Negative.   Neurological: Positive for tremors, weakness and numbness.       Tingling   Hematological: Negative.   Psychiatric/Behavioral: Positive for dysphoric mood. The patient is nervous/anxious.   All other systems reviewed and are negative.      Objective:   Physical Exam Vitals and nursing note reviewed.  Constitutional:      Appearance: Normal appearance.  Cardiovascular:     Rate and Rhythm: Normal rate and regular rhythm.     Pulses: Normal pulses.     Heart sounds: Normal heart sounds.  Pulmonary:     Effort: Pulmonary effort is normal.     Breath sounds: Normal breath sounds.  Musculoskeletal:     Cervical back: Normal range of motion and neck supple.     Comments: Normal Muscle Bulk and Muscle Testing Reveals:  Upper Extremities: Full ROM and Muscle Strength 5/5   Lower Extremities: Full ROM and Muscle Strength 5/5 Arises from Table with ease Narrow Based  Gait   Skin:    General: Skin is warm and dry.  Neurological:     Mental Status: She is alert and oriented to person, place, and time.  Psychiatric:        Mood and Affect: Mood normal.        Behavior: Behavior normal.           Assessment & Plan:  1.Severe peripheral polyneuropathy of undetermined etiology.:09/24/2019 Refilled: MS  Contin30 mg one tablet every 12 hours #60 andcontinueMSIR 30mg  one tablet at HS, may take a tablet during the day as needed #60.  We will continue the opioid monitoring program, this consists of regular clinic visits, examinations, urine drug screen, pill counts as well as use of [place] Controlled Substance Reporting System. 2. Bilateral progressive upper extremity hand pain/ Polyneuropathy.Continue with current medication regime. Tight Glucose Control. Adhere to healthy diet regime.Continue with current medication regimen withnortriptyline:09/24/2019 3. Muscle Spasms: Continuecurrent medication regimen withZanaflex. 09/24/2019 4. Depression/Anxiety: Continuecurrent medication regimen withPaxil and Klonopin.09/24/2019 5. Bilateral Chronic Knee Pain: No complaints Today.Continue HEP as Tolerated and Continue to Monitor.09/24/2019. 6. Right Diabetic Foot Ulcer:No complaints today.Podiatry Following. Wound CenterFollowingon and Vascular.09/24/2019 7. Left-sided Low Back Pain:No complaints today.Continue current medication regimen. Continue to monitor.09/24/2019.  F/U in 1 month  20 minutes of face to face patient care time was spent during this visit. All questions were encouraged and answered.

## 2019-09-29 ENCOUNTER — Ambulatory Visit: Payer: Medicaid Other | Admitting: Family Medicine

## 2019-10-13 ENCOUNTER — Other Ambulatory Visit: Payer: Medicaid Other

## 2019-10-15 ENCOUNTER — Telehealth: Payer: Self-pay | Admitting: *Deleted

## 2019-10-15 NOTE — Telephone Encounter (Addendum)
Prior authorization submitted to [employer] for Morphine Sulfate ER 30mg .  Approved through 01/15/2020

## 2019-10-15 NOTE — Telephone Encounter (Signed)
Patient left a voicemail for a call back on PA for Rx Morphine.   I called her back: Message left that it is in progress. Waiting on insurance response.

## 2019-10-26 ENCOUNTER — Other Ambulatory Visit: Payer: Self-pay

## 2019-10-26 ENCOUNTER — Encounter: Payer: Self-pay | Admitting: Registered Nurse

## 2019-10-26 ENCOUNTER — Encounter: Payer: Medicaid Other | Attending: Physical Medicine & Rehabilitation | Admitting: Registered Nurse

## 2019-10-26 VITALS — BP 126/68 | HR 103 | Temp 98.1°F | Ht 72.0 in

## 2019-10-26 DIAGNOSIS — F341 Dysthymic disorder: Secondary | ICD-10-CM | POA: Diagnosis not present

## 2019-10-26 DIAGNOSIS — M62838 Other muscle spasm: Secondary | ICD-10-CM

## 2019-10-26 DIAGNOSIS — G629 Polyneuropathy, unspecified: Secondary | ICD-10-CM

## 2019-10-26 DIAGNOSIS — E1140 Type 2 diabetes mellitus with diabetic neuropathy, unspecified: Secondary | ICD-10-CM

## 2019-10-26 DIAGNOSIS — Z79891 Long term (current) use of opiate analgesic: Secondary | ICD-10-CM

## 2019-10-26 DIAGNOSIS — M25561 Pain in right knee: Secondary | ICD-10-CM | POA: Diagnosis present

## 2019-10-26 DIAGNOSIS — Z5181 Encounter for therapeutic drug level monitoring: Secondary | ICD-10-CM | POA: Diagnosis not present

## 2019-10-26 DIAGNOSIS — M25562 Pain in left knee: Secondary | ICD-10-CM | POA: Diagnosis present

## 2019-10-26 DIAGNOSIS — G894 Chronic pain syndrome: Secondary | ICD-10-CM

## 2019-10-26 DIAGNOSIS — Z79899 Other long term (current) drug therapy: Secondary | ICD-10-CM | POA: Insufficient documentation

## 2019-10-26 MED ORDER — NORTRIPTYLINE HCL 50 MG PO CAPS: ORAL_CAPSULE | ORAL | 3 refills | Status: DC

## 2019-10-26 MED ORDER — MORPHINE SULFATE ER 30 MG PO TBCR: 30.0000 mg | EXTENDED_RELEASE_TABLET | Freq: Two times a day (BID) | ORAL | 0 refills | Status: DC

## 2019-10-26 MED ORDER — CLONAZEPAM 0.5 MG PO TABS: ORAL_TABLET | ORAL | 2 refills | Status: DC

## 2019-10-26 MED ORDER — MORPHINE SULFATE 30 MG PO TABS: ORAL_TABLET | ORAL | 0 refills | Status: DC

## 2019-10-26 NOTE — Progress Notes (Signed)
Subjective:    Patient ID: [name], female    DOB: 1970-08-28, 49 y.o.   MRN: [id number]  HPI: [name] is a 49 y.o. female who returns for follow up appointment for chronic pain and medication refill. She states her pain is located in her bilateral hands, bilateral lower extremities and bilateral feet with tingling and burning. She rates her pain 6. Her current exercise regime is walking and riding her stationary bicycle for 20 minutes every other day.   [name] equivalent is 120.00  MME.  UDS ordered today.    Pain Inventory Average Pain 6 Pain Right Now 6 My pain is constant, sharp, burning, dull, stabbing, tingling and aching  In the last 24 hours, has pain interfered with the following? General activity 9 Relation with others 9 Enjoyment of life 9 What TIME of day is your pain at its worst? Morning & evening. Mostly anytime. Sleep (in general) Poor  Pain is worse with: walking, bending, standing and some activites Pain improves with: rest and medication Relief from Meds: 6  Mobility use a cane how many minutes can you walk? 10 mins ability to climb steps?  no do you drive?  yes Do you have any goals in this area?  yes  Function disabled: date disabled Maybe 2012 I need assistance with the following:  meal prep, household duties and shopping Do you have any goals in this area?  yes  Neuro/Psych bowel control problems weakness numbness tremor tingling trouble walking spasms dizziness depression anxiety  Prior Studies Any changes since last visit?  no  Physicians involved in your care Any changes since last visit?  no   Family History  Problem Relation Age of Onset  . Fibromyalgia Mother   . Aortic aneurysm Mother   . Stroke Maternal Grandmother   . Colon cancer Maternal Grandmother   . Heart Problems Paternal Grandmother   . Dementia Paternal Grandmother   . Heart attack Paternal Grandfather    Social  History   Socioeconomic History  . Marital status: Legally Separated    Spouse name: Not on file  . Number of children: 1  . Years of education: Not on file  . Highest education level: Bachelor's degree (e.g., BA, AB, BS)  Occupational History  . Occupation: Disabled  Tobacco Use  . Smoking status: Never Smoker  . Smokeless tobacco: Never Used  Vaping Use  . Vaping Use: Never used  Substance and Sexual Activity  . Alcohol use: No    Alcohol/week: 0.0 standard drinks  . Drug use: No    Comment: rx opiods  . Sexual activity: Yes    Birth control/protection: Condom  Other Topics Concern  . Not on file  Social History Narrative  . Not on file   Social Determinants of Health   Financial Resource Strain:   . Difficulty of Paying Living Expenses:   Food Insecurity:   . Worried About Charity fundraiser in the Last Year:   . Arboriculturist in the Last Year:   Transportation Needs:   . Film/video editor (Medical):   Marland Kitchen Lack of Transportation (Non-Medical):   Physical Activity:   . Days of Exercise per Week:   . Minutes of Exercise per Session:   Stress:   . Feeling of Stress :   Social Connections:   . Frequency of Communication with Friends and Family:   . Frequency of Social Gatherings with Friends and Family:   .  Attends Religious Services:   . Active Member of Clubs or Organizations:   . Attends Archivist Meetings:   Marland Kitchen Marital Status:    Past Surgical History:  Procedure Laterality Date  . ABDOMINOPLASTY    . CHOLECYSTECTOMY     Past Medical History:  Diagnosis Date  . DEPRESSION/ANXIETY 08/08/2009  . Diabetes mellitus without complication ([place])   . IBS 02/02/2010  . Neuropathy   . Polyneuropathy 12/04/2010  . Renal stones   . 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) 08/08/2009   BP 126/68   Pulse (!) 103   Temp 98.1 F (36.7 C)   Ht 6' (1.829 m)   BMI 38.92 kg/m    Opioid Risk Score:   Fall Risk Score:  `1  Depression screen PHQ 2/9  Depression screen [facility] 2/9 10/26/2019 06/05/2019 10/13/2018 05/20/2018 09/10/2017 07/11/2017 06/18/2017  Decreased Interest 3 3 2 1 1  0 1  Down, Depressed, Hopeless 3 3 2 1 1  0 1  PHQ - 2 Score 6 6 4 2 2  0 2  Altered sleeping - 3 - - - - -  Tired, decreased energy - 3 - - - - -  Change in appetite - 0 - - - - -  Feeling bad or failure about yourself  - 3 - - - - -  Trouble concentrating - 0 - - - - -  Moving slowly or fidgety/restless - 0 - - - - -  Suicidal thoughts - 0 - - - - -  PHQ-9 Score - 15 - - - - -  Difficult doing work/chores - Somewhat difficult - - - - -  Some recent data might be hidden   Review of Systems  Constitutional: Negative.   HENT: Negative.   Respiratory: Negative.   Cardiovascular: Positive for chest pain.  Gastrointestinal: Positive for constipation.  Endocrine: Negative.   Genitourinary: Negative.   Musculoskeletal: Positive for gait problem.       Pain in feet, calves, & hands  Skin: Negative.   Allergic/Immunologic: Negative.   Neurological: Positive for tremors, weakness and numbness.       Tingling  Psychiatric/Behavioral:       Depression, Anxiety       Objective:   Physical Exam Vitals and nursing note reviewed.  Constitutional:      Appearance: Normal appearance.  Cardiovascular:     Rate and Rhythm: Normal rate and regular rhythm.     Pulses: Normal pulses.     Heart sounds: Normal heart sounds.  Pulmonary:     Effort: Pulmonary effort is normal.     Breath sounds: Normal breath sounds.  Musculoskeletal:     Cervical back: Normal range of motion and neck supple.     Comments: Normal Muscle Bulk and Muscle Testing Reveals:  Upper Extremities: Full ROM and Muscle Strength 5/5 Lower Extremities: Full ROM and Muscle Strength 5/5 Arises from Table with ease Narrow Based  Gait   Skin:    General: Skin is warm and dry.  Neurological:     Mental Status: She is alert  and oriented to person, place, and time.  Psychiatric:        Mood and Affect: Mood normal.        Behavior: Behavior normal.           Assessment & Plan:  1.Severe peripheral polyneuropathy of undetermined etiology.:10/26/2019 Refilled: MS Contin30 mg one tablet every 12 hours #60 andcontinueMSIR 30mg   one tablet at HS, may take a tablet during the day as needed #60.  We will continue the opioid monitoring program, this consists of regular clinic visits, examinations, urine drug screen, pill counts as well as use of [place] Controlled Substance Reporting System. 2. Bilateral progressive upper extremity hand pain/ Polyneuropathy.Continue with current medication regime. Tight Glucose Control. Adhere to healthy diet regime.Continue with current medication regimen withnortriptyline:10/26/2019 3. Muscle Spasms: Continuecurrent medication regimen withZanaflex. 10/26/2019 4. Depression/Anxiety: Continuecurrent medication regimen withPaxil and Klonopin.10/26/2019 5. Bilateral Chronic Knee Pain: No complaints Today.Continue HEP as Tolerated and Continue to Monitor.10/26/2019.  F/U in 1 month  20 minutes of face to face patient care time was spent during this visit. All questions were encouraged and answered.

## 2019-10-28 ENCOUNTER — Encounter: Payer: Self-pay | Admitting: Family Medicine

## 2019-10-28 LAB — TOXASSURE SELECT,+ANTIDEPR,UR

## 2019-11-05 ENCOUNTER — Telehealth: Payer: Self-pay | Admitting: *Deleted

## 2019-11-05 NOTE — Telephone Encounter (Signed)
Urine drug screen for this encounter is consistent for prescribed medication 

## 2019-11-10 ENCOUNTER — Other Ambulatory Visit: Payer: Self-pay | Admitting: *Deleted

## 2019-11-10 ENCOUNTER — Encounter: Payer: Self-pay | Admitting: Family Medicine

## 2019-11-10 MED ORDER — ATORVASTATIN CALCIUM 80 MG PO TABS: 80.0000 mg | ORAL_TABLET | Freq: Every day | ORAL | 3 refills | Status: DC

## 2019-11-25 ENCOUNTER — Telehealth: Payer: Self-pay | Admitting: *Deleted

## 2019-11-25 ENCOUNTER — Telehealth: Payer: Self-pay

## 2019-11-25 MED ORDER — MORPHINE SULFATE 30 MG PO TABS: ORAL_TABLET | ORAL | 0 refills | Status: DC

## 2019-11-25 NOTE — Telephone Encounter (Signed)
Prior authorization for morphine sulfate 30 mg IR APPROVED.  10/30/2019 - 05/01/2020

## 2019-11-25 NOTE — Telephone Encounter (Signed)
Patient called stating she had to reschedule appt to next Wednesday 9/8 and will run out of Morphine Sulfate IR over the weekend. Wants a refill sent to [facility].

## 2019-11-25 NOTE — Telephone Encounter (Signed)
PMP was Reviewed.  MSIR e- scribed today.  

## 2019-11-26 ENCOUNTER — Encounter: Payer: Medicaid Other | Admitting: Registered Nurse

## 2019-12-02 ENCOUNTER — Encounter: Payer: Self-pay | Admitting: Registered Nurse

## 2019-12-02 ENCOUNTER — Other Ambulatory Visit: Payer: Self-pay

## 2019-12-02 ENCOUNTER — Encounter: Payer: Medicaid Other | Attending: Physical Medicine & Rehabilitation | Admitting: Registered Nurse

## 2019-12-02 VITALS — BP 144/81 | HR 101 | Temp 98.9°F | Ht 72.0 in | Wt 288.0 lb

## 2019-12-02 DIAGNOSIS — E1140 Type 2 diabetes mellitus with diabetic neuropathy, unspecified: Secondary | ICD-10-CM | POA: Diagnosis not present

## 2019-12-02 DIAGNOSIS — G629 Polyneuropathy, unspecified: Secondary | ICD-10-CM | POA: Diagnosis not present

## 2019-12-02 DIAGNOSIS — G894 Chronic pain syndrome: Secondary | ICD-10-CM | POA: Diagnosis not present

## 2019-12-02 DIAGNOSIS — Z79891 Long term (current) use of opiate analgesic: Secondary | ICD-10-CM | POA: Insufficient documentation

## 2019-12-02 DIAGNOSIS — F341 Dysthymic disorder: Secondary | ICD-10-CM | POA: Diagnosis present

## 2019-12-02 DIAGNOSIS — Z79899 Other long term (current) drug therapy: Secondary | ICD-10-CM | POA: Insufficient documentation

## 2019-12-02 DIAGNOSIS — M25562 Pain in left knee: Secondary | ICD-10-CM | POA: Insufficient documentation

## 2019-12-02 DIAGNOSIS — Z5181 Encounter for therapeutic drug level monitoring: Secondary | ICD-10-CM | POA: Diagnosis not present

## 2019-12-02 DIAGNOSIS — M25561 Pain in right knee: Secondary | ICD-10-CM | POA: Diagnosis present

## 2019-12-02 MED ORDER — MORPHINE SULFATE 30 MG PO TABS: ORAL_TABLET | ORAL | 0 refills | Status: DC

## 2019-12-02 MED ORDER — MORPHINE SULFATE ER 30 MG PO TBCR: 30.0000 mg | EXTENDED_RELEASE_TABLET | Freq: Two times a day (BID) | ORAL | 0 refills | Status: DC

## 2019-12-02 NOTE — Progress Notes (Signed)
Subjective:    Patient ID: [name], female    DOB: November 03, 1970, 49 y.o.   MRN: [id number]  HPI: [name] is a 49 y.o. female who returns for follow up appointment for chronic pain and medication refill. She states her pain is located in bilateral hands and bilateral feet with tingling and numbness. She rates her pain 6. Her current exercise regime is walking and riding her stationary bicycle for 20 minutes three days a week.   Ms. Pesce Morphine equivalent is 120.00 MME.  She  is also prescribed Clonazepam. We have discussed the black box warning of using opioids and benzodiazepines. I highlighted the dangers of using these drugs together and discussed the adverse events including respiratory suppression, overdose, cognitive impairment and importance of compliance with current regimen. We will continue to monitor and adjust as indicated.    Last UDS was Performed on 10/26/2019, it was consistent.    Pain Inventory Average Pain 7 Pain Right Now 6 My pain is constant, sharp, burning, dull, stabbing, tingling and aching  In the last 24 hours, has pain interfered with the following? General activity 9 Relation with others 9 Enjoyment of life 9 What TIME of day is your pain at its worst? morning  Sleep (in general) Poor  Pain is worse with: walking, bending, standing and some activites Pain improves with: rest, therapy/exercise and medication Relief from Meds: 8  Family History  Problem Relation Age of Onset  . Fibromyalgia Mother   . Aortic aneurysm Mother   . Stroke Maternal Grandmother   . Colon cancer Maternal Grandmother   . Heart Problems Paternal Grandmother   . Dementia Paternal Grandmother   . Heart attack Paternal Grandfather    Social History   Socioeconomic History  . Marital status: Legally Separated    Spouse name: Not on file  . Number of children: 1  . Years of education: Not on file  . Highest education level: Bachelor's degree  (e.g., BA, AB, BS)  Occupational History  . Occupation: Disabled  Tobacco Use  . Smoking status: Never Smoker  . Smokeless tobacco: Never Used  Vaping Use  . Vaping Use: Never used  Substance and Sexual Activity  . Alcohol use: No    Alcohol/week: 0.0 standard drinks  . Drug use: No    Comment: rx opiods  . Sexual activity: Yes    Birth control/protection: Condom  Other Topics Concern  . Not on file  Social History Narrative  . Not on file   Social Determinants of Health   Financial Resource Strain:   . Difficulty of Paying Living Expenses: Not on file  Food Insecurity:   . Worried About Charity fundraiser in the Last Year: Not on file  . Ran Out of Food in the Last Year: Not on file  Transportation Needs:   . Lack of Transportation (Medical): Not on file  . Lack of Transportation (Non-Medical): Not on file  Physical Activity:   . Days of Exercise per Week: Not on file  . Minutes of Exercise per Session: Not on file  Stress:   . Feeling of Stress : Not on file  Social Connections:   . Frequency of Communication with Friends and Family: Not on file  . Frequency of Social Gatherings with Friends and Family: Not on file  . Attends Religious Services: Not on file  . Active Member of Clubs or Organizations: Not on file  . Attends Archivist Meetings: Not  on file  . Marital Status: Not on file   Past Surgical History:  Procedure Laterality Date  . ABDOMINOPLASTY    . CHOLECYSTECTOMY     Past Surgical History:  Procedure Laterality Date  . ABDOMINOPLASTY    . CHOLECYSTECTOMY     Past Medical History:  Diagnosis Date  . DEPRESSION/ANXIETY 08/08/2009  . Diabetes mellitus without complication ([place])   . IBS 02/02/2010  . Neuropathy   . Polyneuropathy 12/04/2010  . Renal stones   . 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) 08/08/2009   BP (!) 148/91   Pulse (!) 108   Temp 98.9 F  (37.2 C)   Ht 6' (1.829 m)   Wt 288 lb (130.6 kg)   SpO2 98%   BMI 39.06 kg/m   Opioid Risk Score:   Fall Risk Score:  `1  Depression screen PHQ 2/9  Depression screen [facility] 2/9 10/26/2019 06/05/2019 10/13/2018 05/20/2018 09/10/2017 07/11/2017 06/18/2017  Decreased Interest 3 3 2 1 1  0 1  Down, Depressed, Hopeless 3 3 2 1 1  0 1  PHQ - 2 Score 6 6 4 2 2  0 2  Altered sleeping - 3 - - - - -  Tired, decreased energy - 3 - - - - -  Change in appetite - 0 - - - - -  Feeling bad or failure about yourself  - 3 - - - - -  Trouble concentrating - 0 - - - - -  Moving slowly or fidgety/restless - 0 - - - - -  Suicidal thoughts - 0 - - - - -  PHQ-9 Score - 15 - - - - -  Difficult doing work/chores - Somewhat difficult - - - - -  Some recent data might be hidden    Review of Systems  Constitutional: Negative.   HENT: Negative.   Eyes: Negative.   Respiratory: Negative.   Cardiovascular: Negative.   Gastrointestinal: Negative.   Endocrine: Negative.   Genitourinary: Negative.   Musculoskeletal: Positive for arthralgias.  Skin: Negative.   Allergic/Immunologic: Negative.   Neurological: Positive for tremors, weakness and numbness.  Hematological: Negative.   Psychiatric/Behavioral: Negative.   All other systems reviewed and are negative.      Objective:   Physical Exam Constitutional:      Appearance: Normal appearance.  Cardiovascular:     Rate and Rhythm: Normal rate and regular rhythm.     Pulses: Normal pulses.     Heart sounds: Normal heart sounds.  Pulmonary:     Effort: Pulmonary effort is normal.     Breath sounds: Normal breath sounds.  Musculoskeletal:     Cervical back: Normal range of motion and neck supple.     Comments: Normal Muscle Bulk and Muscle Testing Reveals:  Upper Extremities: Full ROM and Muscle Strength 5/5  Lower Extremities: Full ROM and Muscle Strength 5/5 Arises from Table with ease Narrow Based  Gait   Skin:    General: Skin is warm and dry.    Neurological:     Mental Status: She is alert and oriented to person, place, and time.  Psychiatric:        Mood and Affect: Mood normal.        Behavior: Behavior normal.           Assessment & Plan:  1.Severe peripheral polyneuropathy of undetermined etiology.:12/02/2019 Refilled: MS Contin30 mg one tablet every 12 hours #60 andMSIR 30mg  one tablet at  HS, may take a tablet during the day as needed #60. We will continue the opioid monitoring program, this consists of regular clinic visits, examinations, urine drug screen, pill counts as well as use of [place] Controlled Substance Reporting system. A 12 month History has been reviewed on the [place] Controlled Substance Reporting System on 12/02/2019 2. Bilateral progressive upper extremity hand pain/ Polyneuropathy.Continue with current medication regime. Tight Glucose Control. Adhere to healthy diet regime.Continue with current medication regimen withnortriptyline:12/02/2019 3. Muscle Spasms: Continuecurrent medication regimen withZanaflex. Continue to Monitor. 12/02/2019 4. Depression/Anxiety: Continuecurrent medication regimen withPaxil and Klonopin.Continue to Monitor. 12/02/2019 5. Bilateral Chronic Knee Pain: No complaints Today.Continue HEP as Tolerated and Continue to Monitor.12/02/2019.  F/U in 1 month  5minutes of face to face patient care time was spent during this visit. All questions were encouraged and answered.

## 2019-12-07 ENCOUNTER — Ambulatory Visit: Payer: Medicaid Other | Admitting: Family Medicine

## 2019-12-07 NOTE — Progress Notes (Deleted)
   Subjective:   Patient ID: [name]    DOB: [birth year], 49 y.o. female   MRN: [id number]  [name] is a 49 y.o. female with a history of HTN, IBS, T2DM with ophthalmic and neuropathy complications, chronic pain, MDD, h/o Vit D deficiency, hypertriglyceridemia, obesity, vegetarian diet here for diabetes f/u and check up.   Diabetes: Last three A1C's below. Currently on Glipizide 10mg  QD (per patient request given intolerance to ***), liraglutide1.8mg  QD, Metformin 1000mg  BID. Endorses compliance. Notes CBGs range ***. Denies any hypoglycemia. Denies any polyuria, polydipsia, polyphagia. Due for ***.  Lab Results  Component Value Date   HGBA1C 8.3 (A) 05/08/2019   HGBA1C 7.5 (A) 09/23/2018   HGBA1C 8.6 07/11/2017    HTN:    today. Currently on Enalapril 10mg  QD, . Endorses compliance. Non-smoker/Current everyday smoker***. Denies any chest pain, SOB, vision changes, or headaches.   Health Maintenance: COVID19, mammogram, colonoscopy, pap-smear, flu and A1C  Review of Systems:  Per HPI.   Objective:   There were no vitals taken for this visit. Vitals and nursing note reviewed.  General: pleasant ***, sitting comfortably in exam chair, well nourished, well developed, in no acute distress with non-toxic appearance HEENT: normocephalic, atraumatic, moist mucous membranes, oropharynx clear without erythema or exudate, TM normal bilaterally  Neck: supple, non-tender without lymphadenopathy CV: regular rate and rhythm without murmurs, rubs, or gallops, no lower extremity edema, 2+ radial and pedal pulses bilaterally Lungs: clear to auscultation bilaterally with normal work of breathing on room air Resp: breathing comfortably on room air, speaking in full sentences Abdomen: soft, non-tender, non-distended, no masses or organomegaly palpable, normoactive bowel sounds Skin: warm, dry, no rashes or lesions Extremities: warm and well perfused, normal tone MSK: ROM  grossly intact, strength intact, gait normal Neuro: Alert and oriented, speech normal  Assessment & Plan:   No problem-specific Assessment & Plan notes found for this encounter.  No orders of the defined types were placed in this encounter.  No orders of the defined types were placed in this encounter.   Mina Marble, DO PGY-3, [facility] 12/07/2019 9:07 AM

## 2020-01-04 MED ORDER — TIZANIDINE HCL 2 MG PO TABS
2.0000 mg | ORAL_TABLET | Freq: Three times a day (TID) | ORAL | 1 refills | Status: DC
Start: 2020-01-04 — End: 2020-01-12

## 2020-01-05 ENCOUNTER — Encounter: Payer: Medicaid Other | Attending: Physical Medicine & Rehabilitation | Admitting: Registered Nurse

## 2020-01-05 ENCOUNTER — Other Ambulatory Visit: Payer: Self-pay

## 2020-01-05 ENCOUNTER — Encounter: Payer: Self-pay | Admitting: Registered Nurse

## 2020-01-05 VITALS — BP 134/81 | HR 107 | Temp 98.6°F | Ht 72.0 in | Wt 285.6 lb

## 2020-01-05 DIAGNOSIS — G894 Chronic pain syndrome: Secondary | ICD-10-CM | POA: Diagnosis not present

## 2020-01-05 DIAGNOSIS — F341 Dysthymic disorder: Secondary | ICD-10-CM | POA: Diagnosis not present

## 2020-01-05 DIAGNOSIS — M62838 Other muscle spasm: Secondary | ICD-10-CM | POA: Diagnosis not present

## 2020-01-05 DIAGNOSIS — E1140 Type 2 diabetes mellitus with diabetic neuropathy, unspecified: Secondary | ICD-10-CM | POA: Insufficient documentation

## 2020-01-05 DIAGNOSIS — Z79891 Long term (current) use of opiate analgesic: Secondary | ICD-10-CM | POA: Diagnosis not present

## 2020-01-05 DIAGNOSIS — Z5181 Encounter for therapeutic drug level monitoring: Secondary | ICD-10-CM

## 2020-01-05 DIAGNOSIS — G629 Polyneuropathy, unspecified: Secondary | ICD-10-CM | POA: Diagnosis not present

## 2020-01-05 MED ORDER — MORPHINE SULFATE 30 MG PO TABS
ORAL_TABLET | ORAL | 0 refills | Status: DC
Start: 2020-01-05 — End: 2020-02-09

## 2020-01-05 MED ORDER — MORPHINE SULFATE ER 30 MG PO TBCR
30.0000 mg | EXTENDED_RELEASE_TABLET | Freq: Two times a day (BID) | ORAL | 0 refills | Status: DC
Start: 2020-01-05 — End: 2020-02-09

## 2020-01-05 NOTE — Progress Notes (Signed)
Subjective:    Patient ID: [name], female    DOB: 09/08/1970, 49 y.o.   MRN: [id number]  HPI: [name] is a 49 y.o. female who returns for follow up appointment for chronic pain and medication refill. She states her  pain is located in her bilateral hands and bilateral feet. She rates her pain 9. Her current exercise regime is walking and performing stretching exercises.  Ms. Weight Morphine equivalent is 120.00 MME.    Last UDS was Performed on 10/26/2019, it was consistent.   Pain Inventory Average Pain 8 Pain Right Now 9 My pain is constant, sharp, burning, dull, stabbing, tingling and aching  In the last 24 hours, has pain interfered with the following? General activity 8 Relation with others 8 Enjoyment of life 8 What TIME of day is your pain at its worst? morning  Sleep (in general) Poor  Pain is worse with: walking, bending and standing Pain improves with: rest, medication and massage Relief from Meds: 7  Family History  Problem Relation Age of Onset  . Fibromyalgia Mother   . Aortic aneurysm Mother   . Stroke Maternal Grandmother   . Colon cancer Maternal Grandmother   . Heart Problems Paternal Grandmother   . Dementia Paternal Grandmother   . Heart attack Paternal Grandfather    Social History   Socioeconomic History  . Marital status: Legally Separated    Spouse name: Not on file  . Number of children: 1  . Years of education: Not on file  . Highest education level: Bachelor's degree (e.g., BA, AB, BS)  Occupational History  . Occupation: Disabled  Tobacco Use  . Smoking status: Never Smoker  . Smokeless tobacco: Never Used  Vaping Use  . Vaping Use: Never used  Substance and Sexual Activity  . Alcohol use: No    Alcohol/week: 0.0 standard drinks  . Drug use: No    Comment: rx opiods  . Sexual activity: Yes    Birth control/protection: Condom  Other Topics Concern  . Not on file  Social History Narrative  . Not on  file   Social Determinants of Health   Financial Resource Strain:   . Difficulty of Paying Living Expenses: Not on file  Food Insecurity:   . Worried About Charity fundraiser in the Last Year: Not on file  . Ran Out of Food in the Last Year: Not on file  Transportation Needs:   . Lack of Transportation (Medical): Not on file  . Lack of Transportation (Non-Medical): Not on file  Physical Activity:   . Days of Exercise per Week: Not on file  . Minutes of Exercise per Session: Not on file  Stress:   . Feeling of Stress : Not on file  Social Connections:   . Frequency of Communication with Friends and Family: Not on file  . Frequency of Social Gatherings with Friends and Family: Not on file  . Attends Religious Services: Not on file  . Active Member of Clubs or Organizations: Not on file  . Attends Archivist Meetings: Not on file  . Marital Status: Not on file   Past Surgical History:  Procedure Laterality Date  . ABDOMINOPLASTY    . CHOLECYSTECTOMY     Past Surgical History:  Procedure Laterality Date  . ABDOMINOPLASTY    . CHOLECYSTECTOMY     Past Medical History:  Diagnosis Date  . DEPRESSION/ANXIETY 08/08/2009  . Diabetes mellitus without complication ([place])   .  IBS 02/02/2010  . Neuropathy   . Polyneuropathy 12/04/2010  . Renal stones   . 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) 08/08/2009   BP 134/81   Pulse (!) 107   Temp 98.6 F (37 C)   Ht 6' (1.829 m)   Wt 285 lb 9.6 oz (129.5 kg)   SpO2 98%   BMI 38.73 kg/m   Opioid Risk Score:   Fall Risk Score:  `1  Depression screen PHQ 2/9  Depression screen [facility] 2/9 10/26/2019 06/05/2019 10/13/2018 05/20/2018 09/10/2017 07/11/2017 06/18/2017  Decreased Interest 3 3 2 1 1  0 1  Down, Depressed, Hopeless 3 3 2 1 1  0 1  PHQ - 2 Score 6 6 4 2 2  0 2  Altered sleeping - 3 - - - - -  Tired, decreased energy - 3 - - - - -  Change in appetite -  0 - - - - -  Feeling bad or failure about yourself  - 3 - - - - -  Trouble concentrating - 0 - - - - -  Moving slowly or fidgety/restless - 0 - - - - -  Suicidal thoughts - 0 - - - - -  PHQ-9 Score - 15 - - - - -  Difficult doing work/chores - Somewhat difficult - - - - -  Some recent data might be hidden     Review of Systems     Objective:   Physical Exam Vitals and nursing note reviewed.  Constitutional:      Appearance: Normal appearance.  Cardiovascular:     Rate and Rhythm: Normal rate and regular rhythm.     Pulses: Normal pulses.     Heart sounds: Normal heart sounds.  Pulmonary:     Effort: Pulmonary effort is normal.     Breath sounds: Normal breath sounds.  Musculoskeletal:     Cervical back: Normal range of motion and neck supple.     Comments: Normal Muscle Bulk and Muscle Testing Reveals:  Upper Extremities: Full ROM and Muscle Strength 5/5  Lower Extremities: Full ROM and Muscle Strength 5/5 Arises from table with ease Narrow Based Gait   Skin:    General: Skin is warm and dry.  Neurological:     Mental Status: She is alert and oriented to person, place, and time.  Psychiatric:        Mood and Affect: Mood normal.        Behavior: Behavior normal.           Assessment & Plan:  1.Severe peripheral polyneuropathy of undetermined etiology.:01/05/2020 Refilled: MS Contin30 mg one tablet every 12 hours #60 andMSIR 30mg  one tablet at HS, may take a tablet during the day as needed #60. We will continue the opioid monitoring program, this consists of regular clinic visits, examinations, urine drug screen, pill counts as well as use of [place] Controlled Substance Reporting system. A 12 month History has been reviewed on the [place] Controlled Substance Reporting System on 01/05/2020 2. Bilateral progressive upper extremity hand pain/ Polyneuropathy.Continue with current medication regime. Tight Glucose Control. Adhere to healthy diet  regime.Continue with current medication regimen withnortriptyline:01/05/2020 3. Muscle Spasms: Continuecurrent medication regimen withZanaflex. Continue to Monitor. 01/05/2020 4. Depression/Anxiety: Continuecurrent medication regimen withPaxil and Klonopin.Continue to Monitor. 12/02/2019 5. Bilateral Chronic Knee Pain: No complaints Today.Continue HEP as Tolerated and Continue to Monitor.12/02/2019.  F/U in 1 month  26minutes of face to face patient care  time was spent during this visit. All questions were encouraged and answered.

## 2020-01-06 ENCOUNTER — Other Ambulatory Visit: Payer: Self-pay | Admitting: Family Medicine

## 2020-01-06 DIAGNOSIS — E119 Type 2 diabetes mellitus without complications: Secondary | ICD-10-CM

## 2020-01-11 ENCOUNTER — Other Ambulatory Visit: Payer: Self-pay | Admitting: Family Medicine

## 2020-01-11 DIAGNOSIS — E119 Type 2 diabetes mellitus without complications: Secondary | ICD-10-CM

## 2020-01-12 ENCOUNTER — Other Ambulatory Visit: Payer: Self-pay | Admitting: Physical Medicine & Rehabilitation

## 2020-01-12 ENCOUNTER — Other Ambulatory Visit: Payer: Self-pay | Admitting: *Deleted

## 2020-01-12 DIAGNOSIS — E119 Type 2 diabetes mellitus without complications: Secondary | ICD-10-CM

## 2020-01-15 ENCOUNTER — Encounter: Payer: Self-pay | Admitting: Family Medicine

## 2020-01-15 ENCOUNTER — Ambulatory Visit: Payer: Medicaid Other | Admitting: Family Medicine

## 2020-01-15 ENCOUNTER — Other Ambulatory Visit: Payer: Self-pay

## 2020-01-15 VITALS — BP 104/64 | HR 100 | Ht 72.0 in | Wt 287.6 lb

## 2020-01-15 DIAGNOSIS — I471 Supraventricular tachycardia: Secondary | ICD-10-CM

## 2020-01-15 DIAGNOSIS — R000 Tachycardia, unspecified: Secondary | ICD-10-CM | POA: Diagnosis not present

## 2020-01-15 DIAGNOSIS — E1159 Type 2 diabetes mellitus with other circulatory complications: Secondary | ICD-10-CM

## 2020-01-15 DIAGNOSIS — I152 Hypertension secondary to endocrine disorders: Secondary | ICD-10-CM | POA: Diagnosis not present

## 2020-01-15 DIAGNOSIS — E118 Type 2 diabetes mellitus with unspecified complications: Secondary | ICD-10-CM | POA: Diagnosis not present

## 2020-01-15 DIAGNOSIS — N644 Mastodynia: Secondary | ICD-10-CM

## 2020-01-15 DIAGNOSIS — E781 Pure hyperglyceridemia: Secondary | ICD-10-CM | POA: Diagnosis not present

## 2020-01-15 LAB — POCT GLYCOSYLATED HEMOGLOBIN (HGB A1C): Hemoglobin A1C: 8.1 % — AB (ref 4.0–5.6)

## 2020-01-15 NOTE — Progress Notes (Signed)
Subjective:   Patient ID: [name]    DOB: June 10, 1970, 49 y.o. female   MRN: [id number]  [name] is a 49 y.o. female with a history of HTN, IBS, T2DM wit neuropathy and ophthalmic complication, chronic pain, depression, h/o Vit D deficiency, hypertriglyceridemia, obesity here for follow up.   Diabetes: Last three A1C's below. Currently on Metformin 1000mg  BID, Victoza 1.8mg  QD, and Glipizide 10mg  BID. Endorses compliance. Notes fasting CBGs usually in 120's, postprandial 150->200. Denies any hypoglycemia. Denies any polyuria, polydipsia, polyphagia. Notes that she eats a lot of pasta which she feels is contributing. She is a vegetarian. She has an exercise bike and would like to restart. Was intolerant to SGLT-2 due to recurrent yeast infections and does not want to try this again.   Lab Results  Component Value Date   HGBA1C 8.1 (A) 01/15/2020   HGBA1C 8.3 (A) 05/08/2019   HGBA1C 7.5 (A) 09/23/2018    HTN:  BP: 104/64 today. Currently on Enalapril 10mg  QD. Endorses compliance. Non-smoker. Denies any chest pain, SOB, vision changes, or headaches.   HLD: Last lipid panel below. Currently on Fenofribate and lipitor 80mg  QD. Endorses compliance. Denies any muscles aches or weakness.   Lab Results  Component Value Date   CHOL 131 01/15/2020   HDL 36 (L) 01/15/2020   LDLCALC 30 01/15/2020   LDLDIRECT 35 06/14/2011   TRIG 470 (H) 01/15/2020   CHOLHDL 3.6 01/15/2020   Left chest wall Pain: Notes random chest pain that located primarily in left axilla and radiates to her breast. Left sided pain that comes and goes randomly. She notes that it feels like "muscular". Doesn't feel like pressure. Denies SOB or dizziness. Denies any worsening numbness/tingling down arm or jaw pain. Denies chest pain with exertion. She notes that she feels like her heart is racing. She denies any symptoms when this happens. Denies any feeling palpitations. Denies SOB, dizziness, or chest  pain when her hearts beat really fast.   Health Maintenance: Health Maintenance Due  Topic  . MAMMOGRAM   . COLONOSCOPY   . PAP SMEAR-Modifier   . INFLUENZA VACCINE    No family history of breast or colon cancer to suggest early screening.  Review of Systems:  Per HPI.   Objective:   BP 104/64   Pulse 100   Ht 6' (1.829 m)   Wt 287 lb 9.6 oz (130.5 kg)   SpO2 97%   BMI 39.01 kg/m  Vitals and nursing note reviewed.  General: pleasant middle aged woman, sitting comfortably in exam chair, well nourished, well developed, in no acute distress with non-toxic appearance CV: regular rate and rhythm without murmurs, rubs, or gallops, no lower extremity edema Lungs: clear to auscultation bilaterally with normal work of breathing on room air Resp: breathing comfortably on room air, speaking in full sentences Skin: warm, dry, no rashes or lesions Extremities: warm and well perfused, normal tone MSK: tenderness to palpation along left lateral breast, two to three 1-24mm firm lymph nodes appreciated in left lateral chest wall/distal portion of axilla, left breast diffusely fibrocystic  Neuro: Alert and oriented, speech normal  Assessment & Plan:   Hypertension associated with type 2 diabetes mellitus (HCC) BP slightly soft today. Denies any sypmtoms such as dizziness/lightheadedness. Discussed management options including decreasing enalapril to 5mg  QD vs continuing with home monitoring of BP's. Patient opted for home monitoring. She was instructed to contact me if blood pressures persistently soft in <110/<70.  She voiced understanding  and agreement with plan.   Type 2 diabetes mellitus with complication, without long-term current use of insulin (HCC) A1C remains elevated but slightly improved from 3 months ago. Patient intolerant to SGLT-2. Patient endorses high carbohydrate intake given vegetarian diet. Discussed management options including additional agent/insulin vs lifestyle  modifications. Patient desired lifestyle modifications.  - emphasized importance of low carb diet, increased activity, and weight loss - continue Metformin, Victoza, Glipizide - consider addition of DDP-4 vs long acting insulin if continues to remain elevated - follow up 3 months for follow up  Hypertriglyceridemia Slightly improved from 500's to 470 today. Currently on Atorvastatin 80mg  and Fenofibrate 145mg  QD. She is maxxed out on both medications currently. Strongly encouraged foods low in glycemic index. Can consider Vascepa 2g BID. Will also reach out to pharmacy team for recs if lifestyle modifications are not sufficient. May benefit from referral to Diabetes education vs Healthy Weight and management. Will continue to discuss with patient going forward  Tachycardia Patient noted to have HR 100 on evaluation. Per chart review, appears to be chronic. Does have long history of anxiety that she follows with psych. Does have left sided chest pain however appears more breast related rather than cardiac in origin at this time. She does feel occasional fast heart rate but does note that it is often related to her anxiety. Denies history of arrhythmia.  - CBC and BMP obtain which were normal - given number of acute and chronic concerns today, will plan to further evaluate at follow up visit including EKG, TSH, consider echo.   Breast pain, left Acute on chronic, uncertain prognosis. Patient complaining of left breast pain/lateral chest wall pain. No signs of acute infection, however several small palpable lymph nodes noted on exam that were tender to palpation. Left breast feels fibrocystic throughout. No history of breast discharge. No weight loss.  Bilateral diagnostic ultrasound scheduled for 11/9 given prior complaint of left breast pain. Patient also evaluated by attending Dr. Nori Riis.  - emphasized importance of attending scheduled b/l diagnostic ultrasound  - will follow up results and  recommendations  Health Maintenance: - mammogram scheduled - discussed due for pap-smear and colonoscopy. Will wait at this time for after mammogram - will discuss flu vaccine at follow up visit  Orders Placed This Encounter  Procedures  . Basic Metabolic Panel  . CBC  . Lipid Panel  . HgB A1c   Mina Marble, DO PGY-3, [facility] 01/19/2020 2:53 PM

## 2020-01-15 NOTE — Patient Instructions (Addendum)
It was a pleasure to see you today!  Thank you for choosing [facility] for your primary care.  [name] was seen for diabetes follow up.    Our plans for today were:  Continue your current medications.   Please be sure to get the mammogram as scheduled and I will be sure to follow up the results.  I do not think your chest pain is anything to be worried about. If you develop worsening chest pain, especially if it involves shortness of breath, palpitations, dizziness please follow up or go to the ED for evaluation.   To keep you healthy, please keep in mind the following health maintenance items that you are due for:   1. papsmear 2.  colonoscopy   You should return to our clinic in 3 months for diabetes.   Best Wishes,   Mina Marble, DO

## 2020-01-16 LAB — CBC
Hematocrit: 42.6 % (ref 34.0–46.6)
Hemoglobin: 14.2 g/dL (ref 11.1–15.9)
MCH: 28.3 pg (ref 26.6–33.0)
MCHC: 33.3 g/dL (ref 31.5–35.7)
MCV: 85 fL (ref 79–97)
Platelets: 310 10*3/uL (ref 150–450)
RBC: 5.01 x10E6/uL (ref 3.77–5.28)
RDW: 13.4 % (ref 11.7–15.4)
WBC: 8.5 10*3/uL (ref 3.4–10.8)

## 2020-01-16 LAB — BASIC METABOLIC PANEL
BUN/Creatinine Ratio: 23 (ref 9–23)
BUN: 15 mg/dL (ref 6–24)
CO2: 23 mmol/L (ref 20–29)
Calcium: 10 mg/dL (ref 8.7–10.2)
Chloride: 98 mmol/L (ref 96–106)
Creatinine, Ser: 0.64 mg/dL (ref 0.57–1.00)
GFR calc Af Amer: 119 mL/min/{1.73_m2} (ref >59)
GFR calc non Af Amer: 104 mL/min/{1.73_m2} (ref >59)
Glucose: 192 mg/dL — ABNORMAL HIGH (ref 65–99)
Potassium: 4.3 mmol/L (ref 3.5–5.2)
Sodium: 139 mmol/L (ref 134–144)

## 2020-01-16 LAB — LIPID PANEL
Chol/HDL Ratio: 3.6 ratio (ref 0.0–4.4)
Cholesterol, Total: 131 mg/dL (ref 100–199)
HDL: 36 mg/dL — ABNORMAL LOW (ref >39)
LDL Chol Calc (NIH): 30 mg/dL (ref 0–99)
Triglycerides: 470 mg/dL — ABNORMAL HIGH (ref 0–149)
VLDL Cholesterol Cal: 65 mg/dL — ABNORMAL HIGH (ref 5–40)

## 2020-01-17 MED ORDER — VICTOZA 18 MG/3ML ~~LOC~~ SOPN: PEN_INJECTOR | SUBCUTANEOUS | 3 refills | Status: DC

## 2020-01-19 ENCOUNTER — Other Ambulatory Visit: Payer: Self-pay | Admitting: Family Medicine

## 2020-01-19 DIAGNOSIS — R000 Tachycardia, unspecified: Secondary | ICD-10-CM | POA: Insufficient documentation

## 2020-01-19 DIAGNOSIS — E118 Type 2 diabetes mellitus with unspecified complications: Secondary | ICD-10-CM

## 2020-01-19 DIAGNOSIS — N644 Mastodynia: Secondary | ICD-10-CM | POA: Insufficient documentation

## 2020-01-19 NOTE — Assessment & Plan Note (Addendum)
Acute on chronic, uncertain prognosis. Patient complaining of left breast pain/lateral chest wall pain. No signs of acute infection, however several small palpable lymph nodes noted on exam that were tender to palpation. Left breast feels fibrocystic throughout. No history of breast discharge. No weight loss.  Bilateral diagnostic ultrasound scheduled for 11/9 given prior complaint of left breast pain. Patient also evaluated by attending Dr. Nori Riis.  - emphasized importance of attending scheduled b/l diagnostic ultrasound  - will follow up results and recommendations

## 2020-01-19 NOTE — Assessment & Plan Note (Signed)
Slightly improved from 500's to 470 today. Currently on Atorvastatin 80mg  and Fenofibrate 145mg  QD. She is maxxed out on both medications currently. Strongly encouraged foods low in glycemic index. Can consider Vascepa 2g BID. Will also reach out to pharmacy team for recs if lifestyle modifications are not sufficient. May benefit from referral to Diabetes education vs Healthy Weight and management. Will continue to discuss with patient going forward

## 2020-01-19 NOTE — Assessment & Plan Note (Addendum)
Patient noted to have HR 100 on evaluation. Per chart review, appears to be chronic. Does have long history of anxiety that she follows with psych. Does have left sided chest pain however appears more breast related rather than cardiac in origin at this time. She does feel occasional fast heart rate but does note that it is often related to her anxiety. Denies history of arrhythmia.  - CBC and BMP obtain which were normal - given number of acute and chronic concerns today, will plan to further evaluate at follow up visit including EKG, TSH, consider echo.

## 2020-01-19 NOTE — Assessment & Plan Note (Signed)
A1C remains elevated but slightly improved from 3 months ago. Patient intolerant to SGLT-2. Patient endorses high carbohydrate intake given vegetarian diet. Discussed management options including additional agent/insulin vs lifestyle modifications. Patient desired lifestyle modifications.  - emphasized importance of low carb diet, increased activity, and weight loss - continue Metformin, Victoza, Glipizide - consider addition of DDP-4 vs long acting insulin if continues to remain elevated - follow up 3 months for follow up

## 2020-01-19 NOTE — Assessment & Plan Note (Signed)
BP slightly soft today. Denies any sypmtoms such as dizziness/lightheadedness. Discussed management options including decreasing enalapril to 5mg  QD vs continuing with home monitoring of BP's. Patient opted for home monitoring. She was instructed to contact me if blood pressures persistently soft in <110/<70.  She voiced understanding and agreement with plan.

## 2020-02-01 ENCOUNTER — Other Ambulatory Visit: Payer: Self-pay | Admitting: Family Medicine

## 2020-02-01 DIAGNOSIS — N644 Mastodynia: Secondary | ICD-10-CM

## 2020-02-02 ENCOUNTER — Other Ambulatory Visit: Payer: Self-pay | Admitting: Family Medicine

## 2020-02-02 DIAGNOSIS — E118 Type 2 diabetes mellitus with unspecified complications: Secondary | ICD-10-CM

## 2020-02-09 ENCOUNTER — Encounter: Payer: Medicaid Other | Attending: Physical Medicine & Rehabilitation | Admitting: Registered Nurse

## 2020-02-09 ENCOUNTER — Encounter: Payer: Self-pay | Admitting: Registered Nurse

## 2020-02-09 ENCOUNTER — Other Ambulatory Visit: Payer: Self-pay

## 2020-02-09 VITALS — BP 133/84 | HR 105 | Temp 98.5°F | Ht 72.0 in | Wt 285.0 lb

## 2020-02-09 DIAGNOSIS — M62838 Other muscle spasm: Secondary | ICD-10-CM | POA: Diagnosis not present

## 2020-02-09 DIAGNOSIS — Z5181 Encounter for therapeutic drug level monitoring: Secondary | ICD-10-CM | POA: Insufficient documentation

## 2020-02-09 DIAGNOSIS — Z79891 Long term (current) use of opiate analgesic: Secondary | ICD-10-CM | POA: Diagnosis not present

## 2020-02-09 DIAGNOSIS — G629 Polyneuropathy, unspecified: Secondary | ICD-10-CM | POA: Insufficient documentation

## 2020-02-09 DIAGNOSIS — E1140 Type 2 diabetes mellitus with diabetic neuropathy, unspecified: Secondary | ICD-10-CM

## 2020-02-09 DIAGNOSIS — F341 Dysthymic disorder: Secondary | ICD-10-CM | POA: Diagnosis not present

## 2020-02-09 DIAGNOSIS — G894 Chronic pain syndrome: Secondary | ICD-10-CM

## 2020-02-09 MED ORDER — MORPHINE SULFATE ER 30 MG PO TBCR
30.0000 mg | EXTENDED_RELEASE_TABLET | Freq: Two times a day (BID) | ORAL | 0 refills | Status: DC
Start: 2020-02-09 — End: 2020-03-14

## 2020-02-09 MED ORDER — MORPHINE SULFATE 30 MG PO TABS: ORAL_TABLET | ORAL | 0 refills | Status: DC

## 2020-02-09 MED ORDER — CLONAZEPAM 0.5 MG PO TABS: ORAL_TABLET | ORAL | 2 refills | Status: DC

## 2020-02-09 NOTE — Progress Notes (Signed)
Subjective:    Patient ID: [name], female    DOB: [birth year], 49 y.o.   MRN: [id number]  HPI: [name] is a 49 y.o. female who returns for follow up appointment for chronic pain and medication refill. She states her pain is located in her bilateral hands and bilateral feet with tingling and numbness. She  rates her pain 6. Her current exercise regime is walking and performing stretching exercises.  [name] Morphine equivalent is 120.00  MME.  UDS ordered Today.    Pain Inventory Average Pain 6 Pain Right Now 6 My pain is constant, sharp, burning, dull and stabbing  In the last 24 hours, has pain interfered with the following? General activity 9 Relation with others 9 Enjoyment of life 9 What TIME of day is your pain at its worst? morning  and evening Sleep (in general) Poor  Pain is worse with: walking, bending, sitting and standing Pain improves with: rest and medication Relief from Meds: 7  Family History  Problem Relation Age of Onset  . Fibromyalgia Mother   . Aortic aneurysm Mother   . Stroke Maternal Grandmother   . Colon cancer Maternal Grandmother   . Heart Problems Paternal Grandmother   . Dementia Paternal Grandmother   . Heart attack Paternal Grandfather    Social History   Socioeconomic History  . Marital status: Legally Separated    Spouse name: Not on file  . Number of children: 1  . Years of education: Not on file  . Highest education level: Bachelor's degree (e.g., BA, AB, BS)  Occupational History  . Occupation: Disabled  Tobacco Use  . Smoking status: Never Smoker  . Smokeless tobacco: Never Used  Vaping Use  . Vaping Use: Never used  Substance and Sexual Activity  . Alcohol use: No    Alcohol/week: 0.0 standard drinks  . Drug use: No    Comment: rx opiods  . Sexual activity: Yes    Birth control/protection: Condom  Other Topics Concern  . Not on file  Social History Narrative  . Not on file   Social  Determinants of Health   Financial Resource Strain:   . Difficulty of Paying Living Expenses: Not on file  Food Insecurity:   . Worried About Charity fundraiser in the Last Year: Not on file  . Ran Out of Food in the Last Year: Not on file  Transportation Needs:   . Lack of Transportation (Medical): Not on file  . Lack of Transportation (Non-Medical): Not on file  Physical Activity:   . Days of Exercise per Week: Not on file  . Minutes of Exercise per Session: Not on file  Stress:   . Feeling of Stress : Not on file  Social Connections:   . Frequency of Communication with Friends and Family: Not on file  . Frequency of Social Gatherings with Friends and Family: Not on file  . Attends Religious Services: Not on file  . Active Member of Clubs or Organizations: Not on file  . Attends Archivist Meetings: Not on file  . Marital Status: Not on file   Past Surgical History:  Procedure Laterality Date  . ABDOMINOPLASTY    . CHOLECYSTECTOMY     Past Surgical History:  Procedure Laterality Date  . ABDOMINOPLASTY    . CHOLECYSTECTOMY     Past Medical History:  Diagnosis Date  . DEPRESSION/ANXIETY 08/08/2009  . Diabetes mellitus without complication ([place])   . IBS 02/02/2010  .  Neuropathy   . Polyneuropathy 12/04/2010  . Renal stones   . 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) 08/08/2009   BP 133/84   Pulse (!) 105   Temp 98.5 F (36.9 C)   Ht 6' (1.829 m)   Wt 285 lb (129.3 kg)   SpO2 97%   BMI 38.65 kg/m   Opioid Risk Score:   Fall Risk Score:  `1  Depression screen PHQ 2/9  Depression screen [facility] 2/9 01/15/2020 10/26/2019 06/05/2019 10/13/2018 05/20/2018 09/10/2017 07/11/2017  Decreased Interest 2 3 3 2 1 1  0  Down, Depressed, Hopeless 2 3 3 2 1 1  0  PHQ - 2 Score 4 6 6 4 2 2  0  Altered sleeping 3 - 3 - - - -  Tired, decreased energy 3 - 3 - - - -  Change in appetite 0 - 0 - - - -    Feeling bad or failure about yourself  3 - 3 - - - -  Trouble concentrating 1 - 0 - - - -  Moving slowly or fidgety/restless 0 - 0 - - - -  Suicidal thoughts 0 - 0 - - - -  PHQ-9 Score 14 - 15 - - - -  Difficult doing work/chores - - Somewhat difficult - - - -  Some recent data might be hidden    Review of Systems  Constitutional: Negative.   HENT: Negative.   Eyes: Negative.   Respiratory: Negative.   Cardiovascular: Negative.   Gastrointestinal: Negative.   Endocrine: Negative.   Genitourinary: Negative.   Musculoskeletal: Positive for back pain and gait problem.  Skin: Negative.   Allergic/Immunologic: Negative.   Hematological: Negative.   Psychiatric/Behavioral: Negative.   All other systems reviewed and are negative.      Objective:   Physical Exam Vitals and nursing note reviewed.  Constitutional:      Appearance: Normal appearance.  Cardiovascular:     Rate and Rhythm: Normal rate and regular rhythm.     Pulses: Normal pulses.     Heart sounds: Normal heart sounds.  Pulmonary:     Effort: Pulmonary effort is normal.     Breath sounds: Normal breath sounds.  Musculoskeletal:     Cervical back: Neck supple.     Comments: Normal Muscle Bulk and Muscle Testing Reveals:  Upper Extremities: Full ROM and Muscle Strength 5/5 Lower Extremities: Full ROM and Muscle Strength 5/5 Arises from Table with ease Narrow Based  Gait   Skin:    General: Skin is warm and dry.  Neurological:     Mental Status: She is alert and oriented to person, place, and time.  Psychiatric:        Mood and Affect: Mood normal.        Behavior: Behavior normal.           Assessment & Plan:  1.Severe peripheral polyneuropathy of undetermined etiology.:02/09/2020 Refilled: MS Contin30 mg one tablet every 12 hours #60 andMSIR 30mg  one tablet at HS, may take a tablet during the day as needed #60. We will continue the opioid monitoring program, this consists of regular clinic  visits, examinations, urine drug screen, pill counts as well as use of [place] Controlled Substance Reporting system. A 12 month History has been reviewed on the [place] Controlled Substance Reporting Systemon 02/09/2020 2. Bilateral progressive upper extremity hand pain/ Polyneuropathy.Continue with current medication regime. Tight Glucose Control. Adhere to healthy diet regime.Continue with  current medication regimen withnortriptyline:02/09/2020 3. Muscle Spasms: Continuecurrent medication regimen withZanaflex.Continue to Monitor.02/09/2020 4. Depression/Anxiety: Continuecurrent medication regimen withPaxil and Klonopin.Continue to Monitor.02/09/2020 5. Bilateral Chronic Knee Pain: No complaints Today.Continue HEP as Tolerated and Continue to Monitor.02/09/2020.  F/U in 1 month  43minutes of face to face patient care time was spent during this visit. All questions were encouraged and answered.

## 2020-02-15 LAB — TOXASSURE SELECT,+ANTIDEPR,UR

## 2020-02-16 ENCOUNTER — Telehealth: Payer: Self-pay | Admitting: *Deleted

## 2020-02-16 NOTE — Telephone Encounter (Signed)
Prior Josem Kaufmann was submitted via [facility].  Approval received through 05/14/2020.

## 2020-02-29 ENCOUNTER — Telehealth: Payer: Self-pay | Admitting: *Deleted

## 2020-02-29 ENCOUNTER — Ambulatory Visit: Payer: Medicaid Other | Admitting: Obstetrics

## 2020-02-29 NOTE — Telephone Encounter (Signed)
Urine drug screen for this encounter is consistent for prescribed medication 

## 2020-03-07 ENCOUNTER — Ambulatory Visit
Admission: RE | Admit: 2020-03-07 | Discharge: 2020-03-07 | Disposition: A | Discharge status: Home / Self Care | Payer: Medicaid Other | Source: Ambulatory Visit | Attending: Family Medicine | Admitting: Family Medicine

## 2020-03-07 ENCOUNTER — Other Ambulatory Visit: Payer: Self-pay

## 2020-03-07 DIAGNOSIS — N644 Mastodynia: Secondary | ICD-10-CM

## 2020-03-08 ENCOUNTER — Encounter: Payer: Self-pay | Admitting: Family Medicine

## 2020-03-10 ENCOUNTER — Encounter: Payer: Medicaid Other | Admitting: Registered Nurse

## 2020-03-14 ENCOUNTER — Encounter: Payer: Self-pay | Admitting: Registered Nurse

## 2020-03-14 ENCOUNTER — Encounter: Payer: Medicaid Other | Attending: Physical Medicine & Rehabilitation | Admitting: Registered Nurse

## 2020-03-14 ENCOUNTER — Other Ambulatory Visit: Payer: Self-pay

## 2020-03-14 VITALS — BP 138/84 | HR 108 | Temp 99.3°F | Ht 72.0 in | Wt 284.0 lb

## 2020-03-14 DIAGNOSIS — Z79891 Long term (current) use of opiate analgesic: Secondary | ICD-10-CM | POA: Diagnosis not present

## 2020-03-14 DIAGNOSIS — G894 Chronic pain syndrome: Secondary | ICD-10-CM

## 2020-03-14 DIAGNOSIS — M62838 Other muscle spasm: Secondary | ICD-10-CM | POA: Insufficient documentation

## 2020-03-14 DIAGNOSIS — E1140 Type 2 diabetes mellitus with diabetic neuropathy, unspecified: Secondary | ICD-10-CM | POA: Insufficient documentation

## 2020-03-14 DIAGNOSIS — G629 Polyneuropathy, unspecified: Secondary | ICD-10-CM | POA: Diagnosis not present

## 2020-03-14 DIAGNOSIS — Z5181 Encounter for therapeutic drug level monitoring: Secondary | ICD-10-CM | POA: Insufficient documentation

## 2020-03-14 DIAGNOSIS — F341 Dysthymic disorder: Secondary | ICD-10-CM | POA: Insufficient documentation

## 2020-03-14 MED ORDER — MORPHINE SULFATE 30 MG PO TABS: ORAL_TABLET | ORAL | 0 refills | Status: DC

## 2020-03-14 MED ORDER — MORPHINE SULFATE ER 30 MG PO TBCR
30.0000 mg | EXTENDED_RELEASE_TABLET | Freq: Two times a day (BID) | ORAL | 0 refills | Status: DC
Start: 2020-03-14 — End: 2020-04-12

## 2020-03-14 NOTE — Progress Notes (Signed)
Subjective:    Patient ID: [name], female    DOB: 26-Dec-1970, 49 y.o.   MRN: [id number]  HPI: [name] is a 49 y.o. female who returns for follow up appointment for chronic pain and medication refill. She states her pain is located in her bilateral hands and Bilateral feet with tingling and numbness. She rates her pain 8. Her current exercise regime is walking and riding her stationary bicycle for 20 minutes every other day.   [name] Morphine equivalent is 120.00 MME.  Last UDS was Performed on 02/09/2020, it was consistent. she is also prescribed Clonazepam. We have discussed the black box warning of using opioids and benzodiazepines. I highlighted the dangers of using these drugs together and discussed the adverse events including respiratory suppression, overdose, cognitive impairment and importance of compliance with current regimen. We will continue to monitor and adjust as indicated.   Pain Inventory Average Pain 8 Pain Right Now 8 My pain is constant, sharp, burning, dull, stabbing, tingling and aching  In the last 24 hours, has pain interfered with the following? General activity 8 Relation with others 8 Enjoyment of life 8 What TIME of day is your pain at its worst? evening Sleep (in general) Poor  Pain is worse with: walking, standing and some activites Pain improves with: rest and medication Relief from Meds: 6  Family History  Problem Relation Age of Onset  . Fibromyalgia Mother   . Aortic aneurysm Mother   . Stroke Maternal Grandmother   . Colon cancer Maternal Grandmother   . Heart Problems Paternal Grandmother   . Dementia Paternal Grandmother   . Heart attack Paternal Grandfather    Social History   Socioeconomic History  . Marital status: Legally Separated    Spouse name: Not on file  . Number of children: 1  . Years of education: Not on file  . Highest education level: Bachelor's degree (e.g., BA, AB, BS)  Occupational  History  . Occupation: Disabled  Tobacco Use  . Smoking status: Never Smoker  . Smokeless tobacco: Never Used  Vaping Use  . Vaping Use: Never used  Substance and Sexual Activity  . Alcohol use: No    Alcohol/week: 0.0 standard drinks  . Drug use: No    Comment: rx opiods  . Sexual activity: Yes    Birth control/protection: Condom  Other Topics Concern  . Not on file  Social History Narrative  . Not on file   Social Determinants of Health   Financial Resource Strain: Not on file  Food Insecurity: Not on file  Transportation Needs: Not on file  Physical Activity: Not on file  Stress: Not on file  Social Connections: Not on file   Past Surgical History:  Procedure Laterality Date  . ABDOMINOPLASTY    . CHOLECYSTECTOMY     Past Surgical History:  Procedure Laterality Date  . ABDOMINOPLASTY    . CHOLECYSTECTOMY     Past Medical History:  Diagnosis Date  . DEPRESSION/ANXIETY 08/08/2009  . Diabetes mellitus without complication ([place])   . IBS 02/02/2010  . Neuropathy   . Polyneuropathy 12/04/2010  . Renal stones   . 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) 08/08/2009   BP 138/84   Pulse (!) 113   Temp 99.3 F (37.4 C)   Ht 6' (1.829 m)   Wt 284 lb (128.8 kg)   SpO2 97%   BMI 38.52 kg/m   Opioid  Risk Score:   Fall Risk Score:  `1  Depression screen PHQ 2/9  Depression screen [facility] 2/9 01/15/2020 10/26/2019 06/05/2019 10/13/2018 05/20/2018 09/10/2017 07/11/2017  Decreased Interest 2 3 3 2 1 1  0  Down, Depressed, Hopeless 2 3 3 2 1 1  0  PHQ - 2 Score 4 6 6 4 2 2  0  Altered sleeping 3 - 3 - - - -  Tired, decreased energy 3 - 3 - - - -  Change in appetite 0 - 0 - - - -  Feeling bad or failure about yourself  3 - 3 - - - -  Trouble concentrating 1 - 0 - - - -  Moving slowly or fidgety/restless 0 - 0 - - - -  Suicidal thoughts 0 - 0 - - - -  PHQ-9 Score 14 - 15 - - - -  Difficult doing  work/chores - - Somewhat difficult - - - -  Some recent data might be hidden    Review of Systems     Objective:   Physical Exam Constitutional:      Appearance: Normal appearance.  Cardiovascular:     Rate and Rhythm: Normal rate and regular rhythm.     Pulses: Normal pulses.     Heart sounds: Normal heart sounds. No friction rub.  Pulmonary:     Effort: Pulmonary effort is normal.     Breath sounds: Normal breath sounds.  Musculoskeletal:     Cervical back: Normal range of motion and neck supple.     Comments: Normal Muscle Bulk and Muscle Testing Reveals:  Upper Extremities: Full ROM and Muscle Strength 5/5  Lower Extremities: Full ROM and Muscle Strength 5/5 Arise from table slowly using cane for support Narrow based Gait   Skin:    General: Skin is warm and dry.  Neurological:     Mental Status: She is alert and oriented to person, place, and time.  Psychiatric:        Mood and Affect: Mood normal.        Behavior: Behavior normal.           Assessment & Plan:  1.Severe peripheral polyneuropathy of undetermined etiology.:03/14/2020 Refilled: MS Contin30 mg one tablet every 12 hours #60 andMSIR 30mg  one tablet at HS, may take a tablet during the day as needed #60. We will continue the opioid monitoring program, this consists of regular clinic visits, examinations, urine drug screen, pill counts as well as use of [place] Controlled Substance Reporting system. A 12 month History has been reviewed on the [place] Controlled Substance Reporting Systemon12/20/2021 2. Bilateral progressive upper extremity hand pain/ Polyneuropathy.Continue with current medication regime. Tight Glucose Control. Adhere to healthy diet regime.Continue with current medication regimen withnortriptyline:03/14/2020 3. Muscle Spasms: Continuecurrent medication regimen withZanaflex.Continue to Monitor.03/14/2020 4. Depression/Anxiety: Continuecurrent medication regimen  withPaxil and Klonopin.Continue to Monitor.03/14/2020 5. Bilateral Chronic Knee Pain: No complaints Today.Continue HEP as Tolerated and Continue to Monitor.03/14/2020.  F/U in 1 month

## 2020-03-30 ENCOUNTER — Encounter: Payer: Self-pay | Admitting: Family Medicine

## 2020-04-05 ENCOUNTER — Other Ambulatory Visit: Payer: Self-pay | Admitting: Family Medicine

## 2020-04-11 ENCOUNTER — Encounter: Payer: Medicaid Other | Admitting: Registered Nurse

## 2020-04-12 ENCOUNTER — Telehealth: Payer: Self-pay | Admitting: Registered Nurse

## 2020-04-12 MED ORDER — MORPHINE SULFATE ER 30 MG PO TBCR: 30.0000 mg | EXTENDED_RELEASE_TABLET | Freq: Two times a day (BID) | ORAL | 0 refills | Status: DC

## 2020-04-12 NOTE — Telephone Encounter (Addendum)
PMP was Reviewed: Ms Contin e-scribed today.  Ms. Drummonds is aware via telephone call, left message, no answer. Marland Kitchen

## 2020-04-12 NOTE — Addendum Note (Signed)
Addended by: Bayard Hugger on: 04/12/2020 01:59 PM   Modules accepted: Orders

## 2020-04-12 NOTE — Telephone Encounter (Signed)
Patient states she is running out of Morphine ER, her next apt is 1/24

## 2020-04-13 ENCOUNTER — Ambulatory Visit: Payer: Medicaid Other | Admitting: Family Medicine

## 2020-04-13 ENCOUNTER — Telehealth: Payer: Medicaid Other | Admitting: Registered Nurse

## 2020-04-18 ENCOUNTER — Other Ambulatory Visit: Payer: Self-pay

## 2020-04-18 ENCOUNTER — Encounter: Payer: Medicaid Other | Attending: Physical Medicine & Rehabilitation | Admitting: Registered Nurse

## 2020-04-18 VITALS — Ht 72.0 in | Wt 284.0 lb

## 2020-04-18 DIAGNOSIS — G629 Polyneuropathy, unspecified: Secondary | ICD-10-CM | POA: Insufficient documentation

## 2020-04-18 DIAGNOSIS — F341 Dysthymic disorder: Secondary | ICD-10-CM | POA: Insufficient documentation

## 2020-04-18 DIAGNOSIS — E1140 Type 2 diabetes mellitus with diabetic neuropathy, unspecified: Secondary | ICD-10-CM | POA: Diagnosis not present

## 2020-04-18 DIAGNOSIS — Z79891 Long term (current) use of opiate analgesic: Secondary | ICD-10-CM | POA: Diagnosis not present

## 2020-04-18 DIAGNOSIS — Z5181 Encounter for therapeutic drug level monitoring: Secondary | ICD-10-CM

## 2020-04-18 DIAGNOSIS — G894 Chronic pain syndrome: Secondary | ICD-10-CM

## 2020-04-18 DIAGNOSIS — M62838 Other muscle spasm: Secondary | ICD-10-CM | POA: Insufficient documentation

## 2020-04-18 MED ORDER — MORPHINE SULFATE 30 MG PO TABS
ORAL_TABLET | ORAL | 0 refills | Status: DC
Start: 2020-04-18 — End: 2020-05-11

## 2020-04-18 NOTE — Progress Notes (Signed)
Subjective:    Patient ID: [name], female    DOB: [birth year], 50 y.o.   MRN: [id number]  HPI: [name] is a 50 y.o. female whose appointment was changed to a My-Chart Video Visit, [name] requested a My-Chart Video vist, she reports her blood sugar was 480 and she was unable to drive. Also states she didn't seek medical care, she was encouraged to call her PCP today. She verbalizes understanding. [name] agrees with My-Chart Video Visit and Verbalizes understanding.  She states her pain is located in her bilateral hands and bilateral feet with tingling, burning and numbness. She rates her pain 8. Her current exercise regime is walking.  Ms. Creasey Morphine equivalent is 110.00 MME. She is also prescribed Clonazepam..We have discussed the black box warning of using opioids and benzodiazepines. I highlighted the dangers of using these drugs together and discussed the adverse events including respiratory suppression, overdose, cognitive impairment and importance of compliance with current regimen. We will continue to monitor and adjust as indicated.   Last UDS was Performed on 02/09/2020, it was consistent.   [name] CMA, asked the Health and History Questions. This provider and Lysle Morales verified we were speaking with the correct person using two identifiers.    Pain Inventory Average Pain 7 Pain Right Now 8 My pain is constant, sharp, burning, dull, stabbing, tingling and aching  In the last 24 hours, has pain interfered with the following? General activity 8 Relation with others 8 Enjoyment of life 8 What TIME of day is your pain at its worst? evening Sleep (in general) Poor  Pain is worse with: walking, standing and some activites Pain improves with: rest and medication Relief from Meds: 6  Family History  Problem Relation Age of Onset  . Fibromyalgia Mother   . Aortic aneurysm Mother   . Stroke Maternal Grandmother   . Colon cancer  Maternal Grandmother   . Heart Problems Paternal Grandmother   . Dementia Paternal Grandmother   . Heart attack Paternal Grandfather    Social History   Socioeconomic History  . Marital status: Legally Separated    Spouse name: Not on file  . Number of children: 1  . Years of education: Not on file  . Highest education level: Bachelor's degree (e.g., BA, AB, BS)  Occupational History  . Occupation: Disabled  Tobacco Use  . Smoking status: Never Smoker  . Smokeless tobacco: Never Used  Vaping Use  . Vaping Use: Never used  Substance and Sexual Activity  . Alcohol use: No    Alcohol/week: 0.0 standard drinks  . Drug use: No    Comment: rx opiods  . Sexual activity: Yes    Birth control/protection: Condom  Other Topics Concern  . Not on file  Social History Narrative  . Not on file   Social Determinants of Health   Financial Resource Strain: Not on file  Food Insecurity: Not on file  Transportation Needs: Not on file  Physical Activity: Not on file  Stress: Not on file  Social Connections: Not on file   Past Surgical History:  Procedure Laterality Date  . ABDOMINOPLASTY    . CHOLECYSTECTOMY     Past Surgical History:  Procedure Laterality Date  . ABDOMINOPLASTY    . CHOLECYSTECTOMY     Past Medical History:  Diagnosis Date  . DEPRESSION/ANXIETY 08/08/2009  . Diabetes mellitus without complication ([place])   . IBS 02/02/2010  . Neuropathy   . Polyneuropathy 12/04/2010  .  Renal stones   . 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) 08/08/2009   Ht 6' (1.829 m)   Wt 284 lb (128.8 kg)   BMI 38.52 kg/m   Opioid Risk Score:   Fall Risk Score:  `1  Depression screen PHQ 2/9  Depression screen [facility] 2/9 01/15/2020 10/26/2019 06/05/2019 10/13/2018 05/20/2018 09/10/2017 07/11/2017  Decreased Interest 2 3 3 2 1 1  0  Down, Depressed, Hopeless 2 3 3 2 1 1  0  PHQ - 2 Score 4 6 6 4 2 2  0  Altered sleeping  3 - 3 - - - -  Tired, decreased energy 3 - 3 - - - -  Change in appetite 0 - 0 - - - -  Feeling bad or failure about yourself  3 - 3 - - - -  Trouble concentrating 1 - 0 - - - -  Moving slowly or fidgety/restless 0 - 0 - - - -  Suicidal thoughts 0 - 0 - - - -  PHQ-9 Score 14 - 15 - - - -  Difficult doing work/chores - - Somewhat difficult - - - -  Some recent data might be hidden    Review of Systems  Musculoskeletal:       Hand and fee pain  All other systems reviewed and are negative.      Objective:   Physical Exam Vitals and nursing note reviewed.  Musculoskeletal:     Comments: No Physical Exam Performed: My-Chart Video Visit           Assessment & Plan:  1.Severe peripheral polyneuropathy of undetermined etiology.:04/18/2020 Refilled: MS Contin30 mg one tablet every 12 hours #60 andMSIR 30mg  one tablet at HS, may take a tablet during the day as needed #60. We will continue the opioid monitoring program, this consists of regular clinic visits, examinations, urine drug screen, pill counts as well as use of [place] Controlled Substance Reporting system. A 12 month History has been reviewed on the [place] Controlled Substance Reporting Systemon01/24/2022 2. Bilateral progressive upper extremity hand pain/ Polyneuropathy.Continue with current medication regime. Tight Glucose Control. Adhere to healthy diet regime.Continue with current medication regimen withnortriptyline:04/18/2020 3. Muscle Spasms: Continuecurrent medication regimen withZanaflex.Continue to Monitor.04/18/2020 4. Depression/Anxiety: Continuecurrent medication regimen withPaxil and Klonopin.Continue to Monitor.04/18/2020 5. Bilateral Chronic Knee Pain: No complaints Today.Continue HEP as Tolerated and Continue to Monitor.04/18/2020.  My-Chart Video Visit Established Patient Location of Patient: In Her Home Location of Provider: In the Office Supervising Physician: Dr  Letta Pate  F/U in 1 month

## 2020-04-19 ENCOUNTER — Encounter: Payer: Self-pay | Admitting: Registered Nurse

## 2020-05-04 ENCOUNTER — Other Ambulatory Visit: Payer: Self-pay | Admitting: Registered Nurse

## 2020-05-10 ENCOUNTER — Encounter: Payer: Medicaid Other | Admitting: Registered Nurse

## 2020-05-11 ENCOUNTER — Other Ambulatory Visit: Payer: Self-pay

## 2020-05-11 ENCOUNTER — Telehealth: Payer: Self-pay | Admitting: Registered Nurse

## 2020-05-11 ENCOUNTER — Encounter: Payer: Self-pay | Admitting: Registered Nurse

## 2020-05-11 ENCOUNTER — Encounter: Payer: Medicaid Other | Attending: Physical Medicine & Rehabilitation | Admitting: Registered Nurse

## 2020-05-11 VITALS — BP 107/73 | HR 95 | Ht 72.0 in | Wt 282.2 lb

## 2020-05-11 DIAGNOSIS — E1140 Type 2 diabetes mellitus with diabetic neuropathy, unspecified: Secondary | ICD-10-CM | POA: Diagnosis not present

## 2020-05-11 DIAGNOSIS — F341 Dysthymic disorder: Secondary | ICD-10-CM | POA: Insufficient documentation

## 2020-05-11 DIAGNOSIS — Z5181 Encounter for therapeutic drug level monitoring: Secondary | ICD-10-CM | POA: Insufficient documentation

## 2020-05-11 DIAGNOSIS — G629 Polyneuropathy, unspecified: Secondary | ICD-10-CM | POA: Insufficient documentation

## 2020-05-11 DIAGNOSIS — Z79891 Long term (current) use of opiate analgesic: Secondary | ICD-10-CM | POA: Insufficient documentation

## 2020-05-11 DIAGNOSIS — M62838 Other muscle spasm: Secondary | ICD-10-CM | POA: Diagnosis not present

## 2020-05-11 DIAGNOSIS — G894 Chronic pain syndrome: Secondary | ICD-10-CM | POA: Insufficient documentation

## 2020-05-11 MED ORDER — CLONAZEPAM 0.5 MG PO TABS: ORAL_TABLET | ORAL | 2 refills | Status: DC

## 2020-05-11 MED ORDER — MORPHINE SULFATE ER 30 MG PO TBCR: 30.0000 mg | EXTENDED_RELEASE_TABLET | Freq: Two times a day (BID) | ORAL | 0 refills | Status: DC

## 2020-05-11 MED ORDER — MORPHINE SULFATE 30 MG PO TABS: ORAL_TABLET | ORAL | 0 refills | Status: DC

## 2020-05-11 NOTE — Telephone Encounter (Signed)
Ms. Clucas called office. Spoke with Ms. Dulski, her pharmacy out of the MS Contin. Prescription was cancelled. New prescription sent to [facility] on [address]. The above discussed with Ms. Lawler, she verbalizes understanding. This provider placed a call to [facility] on [address] regarding the above, they are getting the medication ready. Placed a call to Ms. Zerkle regarding the above, she verbalizes understanding.

## 2020-05-11 NOTE — Progress Notes (Signed)
Subjective:    Patient ID: [name], female    DOB: [birth year], 50 y.o.   MRN: [id number]  HPI: [name] is a 50 y.o. female who returns for follow up appointment for chronic pain and medication refill. She states her pain is located in her bilateral hands, bilateral lower extremities and bilateral feet with tingling and numbness. She rates her pain 7. Her current exercise regime is walking and performing stretching exercises.  Ms. Selinger Morphine equivalent is 110.00  MME.She  is also prescribed clonazepam. We have discussed the black box warning of using opioids and benzodiazepines. I highlighted the dangers of using these drugs together and discussed the adverse events including respiratory suppression, overdose, cognitive impairment and importance of compliance with current regimen. We will continue to monitor and adjust as indicated.     Last UDS was Performed on 02/09/2020, it was consistent.    Pain Inventory Average Pain 7 Pain Right Now 7 My pain is constant, sharp, dull, stabbing, tingling and aching  In the last 24 hours, has pain interfered with the following? General activity 9 Relation with others 9 Enjoyment of life 9 What TIME of day is your pain at its worst? morning  and night Sleep (in general) Poor  Pain is worse with: walking, bending, standing and some activites Pain improves with: rest, medication and massage Relief from Meds: 7  Family History  Problem Relation Age of Onset  . Fibromyalgia Mother   . Aortic aneurysm Mother   . Stroke Maternal Grandmother   . Colon cancer Maternal Grandmother   . Heart Problems Paternal Grandmother   . Dementia Paternal Grandmother   . Heart attack Paternal Grandfather    Social History   Socioeconomic History  . Marital status: Legally Separated    Spouse name: Not on file  . Number of children: 1  . Years of education: Not on file  . Highest education level: Bachelor's degree (e.g., BA,  AB, BS)  Occupational History  . Occupation: Disabled  Tobacco Use  . Smoking status: Never Smoker  . Smokeless tobacco: Never Used  Vaping Use  . Vaping Use: Never used  Substance and Sexual Activity  . Alcohol use: No    Alcohol/week: 0.0 standard drinks  . Drug use: No    Comment: rx opiods  . Sexual activity: Yes    Birth control/protection: Condom  Other Topics Concern  . Not on file  Social History Narrative  . Not on file   Social Determinants of Health   Financial Resource Strain: Not on file  Food Insecurity: Not on file  Transportation Needs: Not on file  Physical Activity: Not on file  Stress: Not on file  Social Connections: Not on file   Past Surgical History:  Procedure Laterality Date  . ABDOMINOPLASTY    . CHOLECYSTECTOMY     Past Surgical History:  Procedure Laterality Date  . ABDOMINOPLASTY    . CHOLECYSTECTOMY     Past Medical History:  Diagnosis Date  . DEPRESSION/ANXIETY 08/08/2009  . Diabetes mellitus without complication ([place])   . IBS 02/02/2010  . Neuropathy   . Polyneuropathy 12/04/2010  . Renal stones   . 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  Opioid Risk Score:   Fall Risk Score:  `1  Depression screen [facility] 2/9  Depression screen [facility] 2/9 01/15/2020 10/26/2019 06/05/2019 10/13/2018 05/20/2018 09/10/2017  07/11/2017  Decreased Interest 2 3 3 2 1 1  0  Down, Depressed, Hopeless 2 3 3 2 1 1  0  PHQ - 2 Score 4 6 6 4 2 2  0  Altered sleeping 3 - 3 - - - -  Tired, decreased energy 3 - 3 - - - -  Change in appetite 0 - 0 - - - -  Feeling bad or failure about yourself  3 - 3 - - - -  Trouble concentrating 1 - 0 - - - -  Moving slowly or fidgety/restless 0 - 0 - - - -  Suicidal thoughts 0 - 0 - - - -  PHQ-9 Score 14 - 15 - - - -  Difficult doing work/chores - - Somewhat difficult - - - -  Some recent data might be  hidden   Review of Systems  Musculoskeletal:       Balance problem, Pain in the  lower legs, feet & lower arms and hands  All other systems reviewed and are negative.      Objective:   Physical Exam Vitals and nursing note reviewed.  Constitutional:      Appearance: Normal appearance.  Cardiovascular:     Rate and Rhythm: Normal rate and regular rhythm.     Pulses: Normal pulses.     Heart sounds: Normal heart sounds.  Pulmonary:     Effort: Pulmonary effort is normal.     Breath sounds: Normal breath sounds.  Musculoskeletal:     Cervical back: Normal range of motion and neck supple.     Comments: Normal Muscle Bulk and Muscle Testing Reveals:  Upper Extremities: Full ROM and Muscle Strength 5/5  Lower Extremities : Full ROM and Muscle Strength 5/5 Arises from table with ease Narrow Based Gait   Skin:    General: Skin is warm and dry.  Neurological:     Mental Status: She is alert and oriented to person, place, and time.  Psychiatric:        Mood and Affect: Mood normal.        Behavior: Behavior normal.           Assessment & Plan:  1.Severe peripheral polyneuropathy of undetermined etiology.:05/11/2020 Refilled: MS Contin30 mg one tablet every 12 hours #60 andMSIR 30mg  one tablet at HS, may take a tablet during the day as needed #60. We will continue the opioid monitoring program, this consists of regular clinic visits, examinations, urine drug screen, pill counts as well as use of [place] Controlled Substance Reporting system. A 12 month History has been reviewed on the [place] Controlled Substance Reporting Systemon02/16/2022 2. Bilateral progressive upper extremity hand pain/ Polyneuropathy.Continue with current medication regime. Tight Glucose Control. Adhere to healthy diet regime.Continue with current medication regimen withnortriptyline:05/11/2020 3. Muscle Spasms: Continuecurrent medication regimen withZanaflex.Continue to  Monitor.05/11/2020 4. Depression/Anxiety: Continuecurrent medication regimen withPaxil and Klonopin.Continue to Monitor.05/11/2020 5. Bilateral Chronic Knee Pain: No complaints Today.Continue HEP as Tolerated and Continue to Monitor.05/11/2020.  F/U in 1 month

## 2020-05-18 ENCOUNTER — Other Ambulatory Visit: Payer: Self-pay | Admitting: Family Medicine

## 2020-05-18 DIAGNOSIS — E118 Type 2 diabetes mellitus with unspecified complications: Secondary | ICD-10-CM

## 2020-05-23 ENCOUNTER — Other Ambulatory Visit: Payer: Self-pay | Admitting: Family Medicine

## 2020-05-25 ENCOUNTER — Ambulatory Visit: Payer: Medicaid Other | Admitting: Student in an Organized Health Care Education/Training Program

## 2020-05-28 ENCOUNTER — Other Ambulatory Visit: Payer: Self-pay | Admitting: Family Medicine

## 2020-05-31 ENCOUNTER — Ambulatory Visit: Payer: Medicaid Other | Admitting: Family Medicine

## 2020-06-08 ENCOUNTER — Encounter: Payer: Medicaid Other | Attending: Physical Medicine & Rehabilitation | Admitting: Registered Nurse

## 2020-06-08 ENCOUNTER — Other Ambulatory Visit: Payer: Self-pay

## 2020-06-08 ENCOUNTER — Encounter: Payer: Self-pay | Admitting: Registered Nurse

## 2020-06-08 VITALS — BP 142/84 | HR 102 | Temp 97.9°F | Ht 72.0 in | Wt 284.0 lb

## 2020-06-08 DIAGNOSIS — Z79891 Long term (current) use of opiate analgesic: Secondary | ICD-10-CM

## 2020-06-08 DIAGNOSIS — Z5181 Encounter for therapeutic drug level monitoring: Secondary | ICD-10-CM | POA: Diagnosis not present

## 2020-06-08 DIAGNOSIS — G629 Polyneuropathy, unspecified: Secondary | ICD-10-CM

## 2020-06-08 DIAGNOSIS — G894 Chronic pain syndrome: Secondary | ICD-10-CM

## 2020-06-08 DIAGNOSIS — M62838 Other muscle spasm: Secondary | ICD-10-CM

## 2020-06-08 DIAGNOSIS — F341 Dysthymic disorder: Secondary | ICD-10-CM | POA: Diagnosis not present

## 2020-06-08 DIAGNOSIS — E1140 Type 2 diabetes mellitus with diabetic neuropathy, unspecified: Secondary | ICD-10-CM | POA: Diagnosis not present

## 2020-06-08 MED ORDER — MORPHINE SULFATE ER 30 MG PO TBCR: 30.0000 mg | EXTENDED_RELEASE_TABLET | Freq: Two times a day (BID) | ORAL | 0 refills | Status: DC

## 2020-06-08 MED ORDER — MORPHINE SULFATE 30 MG PO TABS: ORAL_TABLET | ORAL | 0 refills | Status: DC

## 2020-06-08 NOTE — Progress Notes (Signed)
Subjective:    Patient ID: [name], female    DOB: 09-17-1970, 50 y.o.   MRN: [id number]  HPI: [name] is a 50 y.o. female who returns for follow up appointment for chronic pain and medication refill. She states her  pain is located in her bilateral hands and bilateral feet. She rates her pain 8. Her current exercise regime is walking, riding stationary bicycle for 20 minutes QOD and performing stretching exercises.  [name] Morphine equivalent is 120.00  MME. She is also prescribed Clonazepam. We have discussed the black box warning of using opioids and benzodiazepines. I highlighted the dangers of using these drugs together and discussed the adverse events including respiratory suppression, overdose, cognitive impairment and importance of compliance with current regimen. We will continue to monitor and adjust as indicated.   Last UDS was Performed on 02/09/2020, it was consistent.     Pain Inventory Average Pain 7 Pain Right Now 8 My pain is constant, sharp, dull, stabbing, tingling and aching  In the last 24 hours, has pain interfered with the following? General activity 8 Relation with others 8 Enjoyment of life 8 What TIME of day is your pain at its worst? evening Sleep (in general) Poor  Pain is worse with: walking, bending, standing and some activites Pain improves with: rest, medication and massage Relief from Meds: 7  Family History  Problem Relation Age of Onset  . Fibromyalgia Mother   . Aortic aneurysm Mother   . Stroke Maternal Grandmother   . Colon cancer Maternal Grandmother   . Heart Problems Paternal Grandmother   . Dementia Paternal Grandmother   . Heart attack Paternal Grandfather    Social History   Socioeconomic History  . Marital status: Legally Separated    Spouse name: Not on file  . Number of children: 1  . Years of education: Not on file  . Highest education level: Bachelor's degree (e.g., BA, AB, BS)   Occupational History  . Occupation: Disabled  Tobacco Use  . Smoking status: Never Smoker  . Smokeless tobacco: Never Used  Vaping Use  . Vaping Use: Never used  Substance and Sexual Activity  . Alcohol use: No    Alcohol/week: 0.0 standard drinks  . Drug use: No    Comment: rx opiods  . Sexual activity: Yes    Birth control/protection: Condom  Other Topics Concern  . Not on file  Social History Narrative  . Not on file   Social Determinants of Health   Financial Resource Strain: Not on file  Food Insecurity: Not on file  Transportation Needs: Not on file  Physical Activity: Not on file  Stress: Not on file  Social Connections: Not on file   Past Surgical History:  Procedure Laterality Date  . ABDOMINOPLASTY    . CHOLECYSTECTOMY     Past Surgical History:  Procedure Laterality Date  . ABDOMINOPLASTY    . CHOLECYSTECTOMY     Past Medical History:  Diagnosis Date  . DEPRESSION/ANXIETY 08/08/2009  . Diabetes mellitus without complication ([place])   . IBS 02/02/2010  . Neuropathy   . Polyneuropathy 12/04/2010  . Renal stones   . 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) 08/08/2009   BP (!) 142/84   Pulse (!) 102   Temp 97.9 F (36.6 C)   Ht 6' (1.829 m)   Wt 284 lb (128.8 kg)   SpO2 99%   BMI 38.52 kg/m  Opioid Risk Score:   Fall Risk Score:  `1  Depression screen [facility] 2/9  Depression screen [facility] 2/9 05/11/2020 01/15/2020 10/26/2019 06/05/2019 10/13/2018 05/20/2018 09/10/2017  Decreased Interest 1 2 3 3 2 1 1   Down, Depressed, Hopeless 1 2 3 3 2 1 1   PHQ - 2 Score 2 4 6 6 4 2 2   Altered sleeping - 3 - 3 - - -  Tired, decreased energy - 3 - 3 - - -  Change in appetite - 0 - 0 - - -  Feeling bad or failure about yourself  - 3 - 3 - - -  Trouble concentrating - 1 - 0 - - -  Moving slowly or fidgety/restless - 0 - 0 - - -  Suicidal thoughts - 0 - 0 - - -  PHQ-9 Score - 14 - 15 - - -   Difficult doing work/chores - - - Somewhat difficult - - -  Some recent data might be hidden    Review of Systems  Musculoskeletal: Positive for gait problem and joint swelling.       Leg, feet, arm and hand pain  All other systems reviewed and are negative.      Objective:   Physical Exam Vitals and nursing note reviewed.  Constitutional:      Appearance: Normal appearance.  Cardiovascular:     Rate and Rhythm: Normal rate and regular rhythm.     Pulses: Normal pulses.     Heart sounds: Normal heart sounds.  Pulmonary:     Effort: Pulmonary effort is normal.     Breath sounds: Normal breath sounds.  Musculoskeletal:     Cervical back: Normal range of motion and neck supple.     Comments: Normal Muscle Bulk and Muscle Testing Reveals:  Upper Extremities: Full ROM and Muscle Strength 5/5 Lower Extremities: Full ROM and Muscle Strength 5/5 Arises from Table with ease Narrow Based  Gait   Skin:    General: Skin is warm and dry.  Neurological:     Mental Status: She is alert and oriented to person, place, and time.  Psychiatric:        Mood and Affect: Mood normal.        Behavior: Behavior normal.           Assessment & Plan:  1.Severe peripheral polyneuropathy of undetermined etiology.:06/08/2020 Refilled: MS Contin30 mg one tablet every 12 hours #60 andMSIR 30mg  one tablet at HS, may take a tablet during the day as needed #60. We will continue the opioid monitoring program, this consists of regular clinic visits, examinations, urine drug screen, pill counts as well as use of [place] Controlled Substance Reporting system. A 12 month History has been reviewed on the [place] Controlled Substance Reporting Systemon03/16/2022 2. Bilateral progressive upper extremity hand pain/ Polyneuropathy.Continue with current medication regime. Tight Glucose Control. Adhere to healthy diet regime.Continue with current medication regimen  withnortriptyline:06/08/2020 3. Muscle Spasms: Continuecurrent medication regimen withZanaflex.Continue to Monitor.06/08/2020 4. Depression/Anxiety: Continuecurrent medication regimen withPaxil and Klonopin.Continue to Monitor.06/08/2020 5. Bilateral Chronic Knee Pain: No complaints Today.Continue HEP as Tolerated and Continue to Monitor.06/08/2020.  F/U in 1 month

## 2020-06-24 ENCOUNTER — Ambulatory Visit: Payer: Medicaid Other | Admitting: Family Medicine

## 2020-06-24 NOTE — Progress Notes (Deleted)
Subjective:   Patient ID: [name]    DOB: [birth year], 50 y.o. female   MRN: [id number]  [name] is a 50 y.o. female with a history of HTN, IBS, T2DM with neuropathy and ophthalmic complications, vegetarian diet, obesity, hypertriglyceridemia, h/o Vit D Deficiency, MDD, chronic pain here for diabetes check up  Diabetes: Last three A1C's below. Currently on Metformin 1000mg  BID, Victoza 1.8mg  daily, glipizide 10mg  QD. Endorses compliance. Notes CBGs range ***. Denies any hypoglycemia. Denies any polyuria, polydipsia, polyphagia. Due for diabetic foot exam and diabetic eye exam. Dexcom: document in plan "A1C ***. Currently monitoring and taking >3 injection per day". Initial RX send message to RN team. Refills - just send to pharmacy "Patient is a good candidate for a CGM device given history of hypoglycemia and using 3 insulin injections per day."  - Continuous Blood Gluc Receiver (DEXCOM G6 RECEIVER) DEVI  - Continuous Blood Gluc Sensor (DEXCOM G6 SENSOR) MISC  - Continuous Blood Gluc Transmit (DEXCOM G6 TRANSMITTER) MISC Lantus/Novolug/Humalog Kwikpens/GLP-1 pens - still need to order BD Insulin Pen Needle (B-D UF III MINI PEN NEEDLES) 31G X 5 MM MISC  Lab Results  Component Value Date   HGBA1C 8.1 (A) 01/15/2020   HGBA1C 8.3 (A) 05/08/2019   HGBA1C 7.5 (A) 09/23/2018    HTN:    today. Currently on Enalapril 10mg  QD. Endorses compliance. Non-smoker/Current everyday smoker***. Denies any chest pain, SOB, vision changes, or headaches.   Lab Results  Component Value Date   CREATININE 0.64 01/15/2020   CREATININE 0.55 (L) 05/08/2019   CREATININE 0.60 05/09/2018     HLD: Last lipid panel below. Currently on Fenofibrate 145mg  QD and Lipitor 80mg  QD. Endorses compliance. Denies any muscles aches or weakness. The 10-year ASCVD risk score Mikey Bussing DC Jr., et al., 2013) is: 4.5%   Lab Results  Component Value Date   CHOL 131 01/15/2020   HDL 36 (L) 01/15/2020    LDLCALC 30 01/15/2020   LDLDIRECT 35 06/14/2011   TRIG 470 (H) 01/15/2020   CHOLHDL 3.6 01/15/2020   Health Maintenance: Health Maintenance Due  Topic  . COLONOSCOPY (Pts 45-76yrs Insurance coverage will need to be confirmed)   . OPHTHALMOLOGY EXAM   . PAP SMEAR-Modifier   . COVID-19 Vaccine (3 - Booster for Moderna series)  . FOOT EXAM    No family history of breast or colon cancer to suggest early screening.  Review of Systems:  Per HPI.   Objective:   There were no vitals taken for this visit. Vitals and nursing note reviewed.  General: pleasant ***, sitting comfortably in exam chair, well nourished, well developed, in no acute distress with non-toxic appearance HEENT: normocephalic, atraumatic, moist mucous membranes, oropharynx clear without erythema or exudate, TM normal bilaterally  Neck: supple, non-tender without lymphadenopathy CV: regular rate and rhythm without murmurs, rubs, or gallops, no lower extremity edema, 2+ radial and pedal pulses bilaterally Lungs: clear to auscultation bilaterally with normal work of breathing on room air Resp: breathing comfortably on room air, speaking in full sentences Abdomen: soft, non-tender, non-distended, no masses or organomegaly palpable, normoactive bowel sounds Skin: warm, dry, no rashes or lesions Extremities: warm and well perfused, normal tone MSK: ROM grossly intact, strength intact, gait normal Neuro: Alert and oriented, speech normal  Diabetic foot exam was performed with the following findings:   No data filed     Assessment & Plan:   No problem-specific Assessment & Plan notes found for this encounter.  No  orders of the defined types were placed in this encounter.  No orders of the defined types were placed in this encounter.   {    This will disappear when note is signed, click to select method of visit    :1}  Mina Marble, DO PGY-3, [facility] 06/24/2020 8:16 AM

## 2020-07-03 NOTE — Progress Notes (Signed)
Subjective:   Patient ID: [name]    DOB: 1970/11/09, 50 y.o. female   MRN: [id number]  [name] is a 50 y.o. female with a history of HTN, IBS, T2DM with neuropathy and ophthalmic complications, vegetarian diet, obesity, hypertriglyceridemia, h/o Vit D Deficiency, MDD, chronic pain here for diabetes check up  Diabetes: Last three A1C's below. Currently on Metformin 1000mg  BID, Victoza 1.8mg  daily, glipizide 10mg  TID (ran out over the last week). Has history of intolerance to [place]. Endorses compliance. Notes CBGs range: fasting 130's. Postprandial: 190's. Notes one night having a BS of 400's. Denies any hypoglycemia. Trying to exercise (riding stationary bike every other day) and eaingt better. Denies any polyuria, polydipsia, polyphagia. Due for diabetic foot exam and diabetic eye exam.  Lab Results  Component Value Date   HGBA1C 7.8 (A) 07/04/2020   HGBA1C 8.1 (A) 01/15/2020   HGBA1C 8.3 (A) 05/08/2019    HTN:  BP: 133/80 today. Currently on Enalapril 10mg  QD. Endorses compliance. Non-smoker. Denies any chest pain, SOB, vision changes, or headaches. Does endorse snoring. STOP-BANG score of 5-6 noting high risk for OSA. She has elevated score of 3 on Sleepiness scale.  Lab Results  Component Value Date   CREATININE 0.64 07/04/2020   CREATININE 0.64 01/15/2020   CREATININE 0.55 (L) 05/08/2019    HLD: Last lipid panel below. Currently on Fenofibrate 145mg  QD and Lipitor 80mg  QD. Endorses compliance. Denies any muscles aches or weakness. The ASCVD Risk score Mikey Bussing DC Jr., et al., 2013) failed to calculate for the following reasons:   The valid total cholesterol range is 130 to 320 mg/dL   Lab Results  Component Value Date   CHOL 129 07/04/2020   HDL 33 (L) 07/04/2020   LDLCALC 18 07/04/2020   LDLDIRECT 35 06/14/2011   TRIG 596 (HH) 07/04/2020   CHOLHDL 3.9 07/04/2020   Health Maintenance: Health Maintenance Due  Topic  . COLONOSCOPY  (Pts 45-27yrs Insurance coverage will need to be confirmed)   . OPHTHALMOLOGY EXAM   . PAP SMEAR-Modifier   . COVID-19 Vaccine (3 - Booster for Moderna series)   No family history of breast or colon cancer to suggest early screening.  Review of Systems:  Per HPI.   Objective:   BP 133/80   Pulse 95   Wt 282 lb (127.9 kg)   SpO2 99%   BMI 38.25 kg/m  Vitals and nursing note reviewed.  General: pleasant older woman, sitting comfortably in exam chair, well nourished, well developed, in no acute distress with non-toxic appearance CV: regular rate and rhythm without murmurs, rubs, or gallops, no lower extremity edema, 2+ radial and pedal pulses bilaterally Lungs: clear to auscultation bilaterally with normal work of breathing on room air, speaking in full sentences Skin: warm, dry,erythematous pink rink on right dorsal foot with raised border and scale Extremities: warm and well perfused, normal tone MSK:  gait normal Neuro: Alert and oriented, speech normal  Diabetic foot exam was performed with the following findings:   No deformities, ulcerations, or other skin breakdown Intact posterior tibialis and dorsalis pedis pulses Diffuse neuropathy bilaterally with no feeling appreciated on tough/monofilament testing No acute ulcers or open sores Thick calluses on plantar aspect of great toe bilaterally      Assessment & Plan:   Hypertension associated with type 2 diabetes mellitus (HCC) Chronic, stable. Does endorse symptoms concerning for OSA with elevated STOP-BANG score of 5-6 today and score of 3 on [place] Sleepiness  Scale. BMP today with stable kidney function. - continue Enalapril 10mg  QD - referral for sleep study  Type 2 diabetes mellitus with complication, without long-term current use of insulin (HCC) Chronic, improving. A1C slowly down trending. Has been working hard to lose weight, exercise, and watch her diet. Congratulated her on these lifestyle changes. Discussed  management options including addition of Januvia for possible additional improvement vs insulin therapy. Patient resistant to starting insulin. Strongly desired to do trial of Glipizide 10mg  TID. - continue Metformin 1000mg  BID, Victoza 1.8mg  QD - Educated that unlikely to have significant benefit with TID dosing but will do trial of Glipizide 10mg  TID. Cautioned to monitor for hypoglycemia.  - follow up 3 months to continue to monitor - could consider addition of DDP-4 at that time - discussed referral to bariatric surgery. She opted to think it over and further discuss at follow up visit  Neuropathy due to type 2 diabetes mellitus ([place]) Diabetic foot exam evidence of diffuse neuropathy. Recommended close monitoring of feet daily to evaluate for any open sores. Can consider referral to podiatry in future if desires.  Hypertriglyceridemia Chronic, worsening despite high dose statin and fibrate. Will refer to lipid clinic for further management  Obesity (BMI 30-39.9) BMI 38 in setting of multiple comorbidities. Discussed benefit of bariatric surgery. Patient opted to continue lifestyle modifications at this time and think it over. Would like to continue discussion at follow up visit.   History of vitamin D deficiency Vitamin D chronically low. Recommended to continue daily Vitamin D 2000IU  Type 2 diabetes mellitus with ophthalmic complication ([place]) Diabetic eye exam scheduled for May 2022  Tinea corporis Rash on foot appears most consistent with tinea corporis. Trial Lotrisone cream BID x 2-3 weeks. If no improvement, encouraged to follow up for further evaluation.   Orders Placed This Encounter  Procedures  . Comprehensive metabolic panel  . CBC  . Lipid Panel  . Vitamin D, 25-hydroxy  . Ferritin  . Comprehensive metabolic panel    Standing Status:   Future    Standing Expiration Date:   07/05/2021  . Ambulatory referral to Gastroenterology    Referral Priority:   Routine     Referral Type:   Consultation    Referral Reason:   Specialty Services Required    Number of Visits Requested:   1  . AMB Referral to [facility]    Referral Priority:   Routine    Referral Type:   Consultation    Referral Reason:   Specialty Services Required    Number of Visits Requested:   1  . POCT glycosylated hemoglobin (Hb A1C)  . Home sleep test    Order Specific Question:   Where should this test be performed:    Answer:   [facility] ordered this encounter  Medications  . clotrimazole-betamethasone (LOTRISONE) cream    Sig: Apply 1 application topically 2 (two) times daily for 21 days.    Dispense:  45 g    Refill:  1  . glipiZIDE (GLUCOTROL) 10 MG tablet    Sig: Take 1 tablet (10 mg total) by mouth 3 (three) times daily after meals.    Dispense:  180 tablet    Refill:  2    ZERO refills remain on this prescription. Your patient is requesting advance approval of refills for this medication to [facility]   Recommended follow up for annual physical exam and health maintenance  Mina Marble, DO PGY-3, [facility] 07/06/2020 7:11 PM

## 2020-07-04 ENCOUNTER — Ambulatory Visit: Payer: Medicaid Other | Admitting: Family Medicine

## 2020-07-04 ENCOUNTER — Other Ambulatory Visit: Payer: Self-pay

## 2020-07-04 VITALS — BP 133/80 | HR 95 | Wt 282.0 lb

## 2020-07-04 DIAGNOSIS — E1140 Type 2 diabetes mellitus with diabetic neuropathy, unspecified: Secondary | ICD-10-CM

## 2020-07-04 DIAGNOSIS — E669 Obesity, unspecified: Secondary | ICD-10-CM

## 2020-07-04 DIAGNOSIS — Z789 Other specified health status: Secondary | ICD-10-CM

## 2020-07-04 DIAGNOSIS — E1159 Type 2 diabetes mellitus with other circulatory complications: Secondary | ICD-10-CM

## 2020-07-04 DIAGNOSIS — E11319 Type 2 diabetes mellitus with unspecified diabetic retinopathy without macular edema: Secondary | ICD-10-CM | POA: Diagnosis not present

## 2020-07-04 DIAGNOSIS — Z1211 Encounter for screening for malignant neoplasm of colon: Secondary | ICD-10-CM | POA: Diagnosis not present

## 2020-07-04 DIAGNOSIS — E559 Vitamin D deficiency, unspecified: Secondary | ICD-10-CM | POA: Diagnosis not present

## 2020-07-04 DIAGNOSIS — E781 Pure hyperglyceridemia: Secondary | ICD-10-CM

## 2020-07-04 DIAGNOSIS — B354 Tinea corporis: Secondary | ICD-10-CM

## 2020-07-04 DIAGNOSIS — E118 Type 2 diabetes mellitus with unspecified complications: Secondary | ICD-10-CM | POA: Diagnosis not present

## 2020-07-04 DIAGNOSIS — R0683 Snoring: Secondary | ICD-10-CM

## 2020-07-04 DIAGNOSIS — Z8639 Personal history of other endocrine, nutritional and metabolic disease: Secondary | ICD-10-CM | POA: Diagnosis not present

## 2020-07-04 DIAGNOSIS — I152 Hypertension secondary to endocrine disorders: Secondary | ICD-10-CM | POA: Diagnosis not present

## 2020-07-04 LAB — POCT GLYCOSYLATED HEMOGLOBIN (HGB A1C): HbA1c, POC (controlled diabetic range): 7.8 % — AB (ref 0.0–7.0)

## 2020-07-04 MED ORDER — CLOTRIMAZOLE-BETAMETHASONE 1-0.05 % EX CREA: 1.0000 "application " | TOPICAL_CREAM | Freq: Two times a day (BID) | CUTANEOUS | 1 refills | Status: AC

## 2020-07-04 MED ORDER — GLIPIZIDE 10 MG PO TABS: 10.0000 mg | ORAL_TABLET | Freq: Three times a day (TID) | ORAL | 2 refills | Status: DC

## 2020-07-04 NOTE — Patient Instructions (Addendum)
It was a pleasure to see you today!  Thank you for choosing [facility] for your primary care.   Our plans for today were:  Continue our diabetes medicines. You can increase Glipizide to 2-3 time a day  I have referred you to Sleep Study  I have referred you to GI for colonoscopy     To keep you healthy, please keep in mind the following health maintenance items that you are due for:   1. Pap-smear   We are checking some labs today, I will call you if they are abnormal will send you a MyChart message or a letter if they are normal.  If you do not hear about your labs in the next 2 weeks please let us know  BRING ALL OF YOUR MEDICATIONS WITH YOU TO EVERY VISIT   You should return to our clinic in 3 months for diabetes follow up.   Best Wishes,   Mina Marble, DO

## 2020-07-05 ENCOUNTER — Telehealth: Payer: Self-pay | Admitting: Family Medicine

## 2020-07-05 LAB — FERRITIN: Ferritin: 49 ng/mL (ref 15–150)

## 2020-07-05 LAB — COMPREHENSIVE METABOLIC PANEL
ALT: 37 IU/L — ABNORMAL HIGH (ref 0–32)
AST: 26 IU/L (ref 0–40)
Albumin/Globulin Ratio: 1.6 (ref 1.2–2.2)
Albumin: 4.7 g/dL (ref 3.8–4.9)
Alkaline Phosphatase: 50 IU/L (ref 44–121)
BUN/Creatinine Ratio: 19 (ref 9–23)
BUN: 12 mg/dL (ref 6–24)
Bilirubin Total: 0.2 mg/dL (ref 0.0–1.2)
CO2: 17 mmol/L — ABNORMAL LOW (ref 20–29)
Calcium: 10.4 mg/dL — ABNORMAL HIGH (ref 8.7–10.2)
Chloride: 95 mmol/L — ABNORMAL LOW (ref 96–106)
Creatinine, Ser: 0.64 mg/dL (ref 0.57–1.00)
Globulin, Total: 3 g/dL (ref 1.5–4.5)
Glucose: 151 mg/dL — ABNORMAL HIGH (ref 65–99)
Potassium: 4.4 mmol/L (ref 3.5–5.2)
Sodium: 139 mmol/L (ref 134–144)
Total Protein: 7.7 g/dL (ref 6.0–8.5)
eGFR: 106 mL/min/{1.73_m2} (ref >59)

## 2020-07-05 LAB — LIPID PANEL
Chol/HDL Ratio: 3.9 ratio (ref 0.0–4.4)
Cholesterol, Total: 129 mg/dL (ref 100–199)
HDL: 33 mg/dL — ABNORMAL LOW (ref >39)
LDL Chol Calc (NIH): 18 mg/dL (ref 0–99)
Triglycerides: 596 mg/dL (ref 0–149)
VLDL Cholesterol Cal: 78 mg/dL — ABNORMAL HIGH (ref 5–40)

## 2020-07-05 LAB — CBC
Hematocrit: 41.9 % (ref 34.0–46.6)
Hemoglobin: 14.2 g/dL (ref 11.1–15.9)
MCH: 28.3 pg (ref 26.6–33.0)
MCHC: 33.9 g/dL (ref 31.5–35.7)
MCV: 84 fL (ref 79–97)
Platelets: 286 10*3/uL (ref 150–450)
RBC: 5.01 x10E6/uL (ref 3.77–5.28)
RDW: 14 % (ref 11.7–15.4)
WBC: 7 10*3/uL (ref 3.4–10.8)

## 2020-07-05 LAB — VITAMIN D 25 HYDROXY (VIT D DEFICIENCY, FRACTURES): Vit D, 25-Hydroxy: 38.7 ng/mL (ref 30.0–100.0)

## 2020-07-05 NOTE — Assessment & Plan Note (Signed)
Chronic, worsening despite high dose statin and fibrate. Will refer to lipid clinic for further management

## 2020-07-05 NOTE — Telephone Encounter (Signed)
LMoVM for call back to discuss labs.  Her triglycerides continue to rise despite max dose cholesterol medicines. I would like to refer her to our [facility] for further assistance in helping manage this.   I would like to repeat her CMP this week (Thursday) as there were some abnormalities I would not have suspected based on our last visit. I fear this may be just an error which is why I would like repeats prior to initiating further evaluation. I recommend she drink adequate fluids prior to her lab visit. Future lab ordered placed.  Will await call back to further discuss.

## 2020-07-05 NOTE — Assessment & Plan Note (Signed)
Diabetic eye exam scheduled for May 2022

## 2020-07-05 NOTE — Assessment & Plan Note (Signed)
Diabetic foot exam evidence of diffuse neuropathy. Recommended close monitoring of feet daily to evaluate for any open sores. Can consider referral to podiatry in future if desires.

## 2020-07-05 NOTE — Assessment & Plan Note (Signed)
Chronic, improving. A1C slowly down trending. Has been working hard to lose weight, exercise, and watch her diet. Congratulated her on these lifestyle changes. Discussed management options including addition of Januvia for possible additional improvement vs insulin therapy. Patient resistant to starting insulin. Strongly desired to do trial of Glipizide 10mg  TID. - continue Metformin 1000mg  BID, Victoza 1.8mg  QD - Educated that unlikely to have significant benefit with TID dosing but will do trial of Glipizide 10mg  TID. Cautioned to monitor for hypoglycemia.  - follow up 3 months to continue to monitor - could consider addition of DDP-4 at that time - discussed referral to bariatric surgery. She opted to think it over and further discuss at follow up visit

## 2020-07-05 NOTE — Assessment & Plan Note (Signed)
Chronic, stable. Does endorse symptoms concerning for OSA with elevated STOP-BANG score of 5-6 today and score of 3 on Stanford Sleepiness Scale. BMP today with stable kidney function. - continue Enalapril 10mg  QD - referral for sleep study

## 2020-07-05 NOTE — Assessment & Plan Note (Signed)
Vitamin D chronically low. Recommended to continue daily Vitamin D 2000IU

## 2020-07-05 NOTE — Assessment & Plan Note (Signed)
BMI 38 in setting of multiple comorbidities. Discussed benefit of bariatric surgery. Patient opted to continue lifestyle modifications at this time and think it over. Would like to continue discussion at follow up visit.

## 2020-07-06 ENCOUNTER — Encounter: Payer: Self-pay | Admitting: Registered Nurse

## 2020-07-06 ENCOUNTER — Other Ambulatory Visit: Payer: Self-pay

## 2020-07-06 ENCOUNTER — Encounter: Payer: Medicaid Other | Attending: Physical Medicine & Rehabilitation | Admitting: Registered Nurse

## 2020-07-06 ENCOUNTER — Telehealth: Payer: Self-pay | Admitting: Family Medicine

## 2020-07-06 VITALS — BP 132/81 | HR 100 | Temp 98.4°F | Ht 72.0 in | Wt 281.2 lb

## 2020-07-06 DIAGNOSIS — M62838 Other muscle spasm: Secondary | ICD-10-CM | POA: Insufficient documentation

## 2020-07-06 DIAGNOSIS — E1140 Type 2 diabetes mellitus with diabetic neuropathy, unspecified: Secondary | ICD-10-CM

## 2020-07-06 DIAGNOSIS — M792 Neuralgia and neuritis, unspecified: Secondary | ICD-10-CM | POA: Diagnosis not present

## 2020-07-06 DIAGNOSIS — B354 Tinea corporis: Secondary | ICD-10-CM | POA: Insufficient documentation

## 2020-07-06 DIAGNOSIS — G894 Chronic pain syndrome: Secondary | ICD-10-CM

## 2020-07-06 DIAGNOSIS — Z79891 Long term (current) use of opiate analgesic: Secondary | ICD-10-CM | POA: Diagnosis not present

## 2020-07-06 DIAGNOSIS — Z5181 Encounter for therapeutic drug level monitoring: Secondary | ICD-10-CM | POA: Insufficient documentation

## 2020-07-06 DIAGNOSIS — E781 Pure hyperglyceridemia: Secondary | ICD-10-CM

## 2020-07-06 DIAGNOSIS — F341 Dysthymic disorder: Secondary | ICD-10-CM | POA: Diagnosis not present

## 2020-07-06 MED ORDER — MORPHINE SULFATE ER 30 MG PO TBCR: 30.0000 mg | EXTENDED_RELEASE_TABLET | Freq: Two times a day (BID) | ORAL | 0 refills | Status: DC

## 2020-07-06 MED ORDER — MORPHINE SULFATE 30 MG PO TABS: ORAL_TABLET | ORAL | 0 refills | Status: DC

## 2020-07-06 NOTE — Telephone Encounter (Signed)
Spoke to patient regarding labs. As already explained in prior phone note, would like repeat CMP given such abnormalities that do not add up based on clinical evaluation. Patient is leaving for [place] tomorrow until next Tuesday. Emphasized importance and she noted she will try very hard. Lab visit scheduled for 9am with instruction to reschedule if unable to come at that time. Future labs for CMP as well as triglycerides (per patient request) placed. She has been referred to lipid clinic and is agreeable with this plan. She should continue currently cholesterol lowering medications as prescribed until seen.

## 2020-07-06 NOTE — Progress Notes (Signed)
Subjective:    Patient ID: [name], female    DOB: Aug 22, 1970, 50 y.o.   MRN: [id number]  HPI: [name] is a 50 y.o. female who returns for follow up appointment for chronic pain and medication refill. She states her  pain is located in her bilateral hands and bilateral feet with tingling and nubmness. She rates her pain 7. Her current exercise regime is walking, righing her stationary bicycle for 20 minutes QOD she was encouraged to perform stretching exercises, she verbalizes understanding.  [name] Morphine equivalent is 120.00 MME. She is also prescribed Clonazepam. We have discussed the black box warning of using opioids and benzodiazepines. I highlighted the dangers of using these drugs together and discussed the adverse events including respiratory suppression, overdose, cognitive impairment and importance of compliance with current regimen. We will continue to monitor and adjust as indicated.    UDS ordered today.    Pain Inventory Average Pain 6 Pain Right Now 7 My pain is constant, sharp, burning, dull, stabbing, tingling and aching  In the last 24 hours, has pain interfered with the following? General activity 8 Relation with others 8 Enjoyment of life 8 What TIME of day is your pain at its worst? morning  Sleep (in general) Poor  Pain is worse with: walking, bending, standing and some activites Pain improves with: rest and medication Relief from Meds: 7  Family History  Problem Relation Age of Onset  . Fibromyalgia Mother   . Aortic aneurysm Mother   . Stroke Maternal Grandmother   . Colon cancer Maternal Grandmother   . Heart Problems Paternal Grandmother   . Dementia Paternal Grandmother   . Heart attack Paternal Grandfather    Social History   Socioeconomic History  . Marital status: Legally Separated    Spouse name: Not on file  . Number of children: 1  . Years of education: Not on file  . Highest education level: Bachelor's  degree (e.g., BA, AB, BS)  Occupational History  . Occupation: Disabled  Tobacco Use  . Smoking status: Never Smoker  . Smokeless tobacco: Never Used  Vaping Use  . Vaping Use: Never used  Substance and Sexual Activity  . Alcohol use: No    Alcohol/week: 0.0 standard drinks  . Drug use: No    Comment: rx opiods  . Sexual activity: Yes    Birth control/protection: Condom  Other Topics Concern  . Not on file  Social History Narrative  . Not on file   Social Determinants of Health   Financial Resource Strain: Not on file  Food Insecurity: Not on file  Transportation Needs: Not on file  Physical Activity: Not on file  Stress: Not on file  Social Connections: Not on file   Past Surgical History:  Procedure Laterality Date  . ABDOMINOPLASTY    . CHOLECYSTECTOMY     Past Surgical History:  Procedure Laterality Date  . ABDOMINOPLASTY    . CHOLECYSTECTOMY     Past Medical History:  Diagnosis Date  . DEPRESSION/ANXIETY 08/08/2009  . Diabetes mellitus without complication ([place])   . IBS 02/02/2010  . Neuropathy   . Polyneuropathy 12/04/2010  . Renal stones   . 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) 08/08/2009   BP 132/81   Pulse (!) 103   Temp 98.4 F (36.9 C)   Ht 6' (1.829 m)   Wt 281 lb 3.2 oz (127.6 kg)   SpO2 98%  BMI 38.14 kg/m   Opioid Risk Score:   Fall Risk Score:  `1  Depression screen PHQ 2/9  Depression screen [name] 2/9 06/08/2020 05/11/2020 01/15/2020 10/26/2019 06/05/2019 10/13/2018 05/20/2018  Decreased Interest 1 1 2 3 3 2 1   Down, Depressed, Hopeless 1 1 2 3 3 2 1   PHQ - 2 Score 2 2 4 6 6 4 2   Altered sleeping - - 3 - 3 - -  Tired, decreased energy - - 3 - 3 - -  Change in appetite - - 0 - 0 - -  Feeling bad or failure about yourself  - - 3 - 3 - -  Trouble concentrating - - 1 - 0 - -  Moving slowly or fidgety/restless - - 0 - 0 - -  Suicidal thoughts - - 0 - 0 - -  PHQ-9  Score - - 14 - 15 - -  Difficult doing work/chores - - - - Somewhat difficult - -  Some recent data might be hidden   Review of Systems  Musculoskeletal: Positive for back pain and gait problem.       Pain in both feet & hands  All other systems reviewed and are negative.      Objective:   Physical Exam Vitals and nursing note reviewed.  Constitutional:      Appearance: Normal appearance. She is obese.  Cardiovascular:     Rate and Rhythm: Normal rate and regular rhythm.     Pulses: Normal pulses.     Heart sounds: Normal heart sounds.  Pulmonary:     Effort: Pulmonary effort is normal.     Breath sounds: Normal breath sounds.  Musculoskeletal:     Cervical back: Normal range of motion and neck supple.     Comments: Normal Muscle Bulk and Muscle Testing Reveals:  Upper Extremities: Full ROM and Muscle Strength 5/5 Lower Extremities: Full ROM and Muscle Strength 5/5 Arises from chair with ease, using cane for support Narrow Based  Gait   Skin:    General: Skin is warm and dry.  Neurological:     Mental Status: She is alert and oriented to person, place, and time.  Psychiatric:        Mood and Affect: Mood normal.        Behavior: Behavior normal.           Assessment & Plan:  1.Severe peripheral polyneuropathy of undetermined etiology.:07/06/2020 Refilled: MS Contin30 mg one tablet every 12 hours #60 andMSIR 30mg  one tablet at HS, may take a tablet during the day as needed #60. We will continue the opioid monitoring program, this consists of regular clinic visits, examinations, urine drug screen, pill counts as well as use of [place] Controlled Substance Reporting system. A 12 month History has been reviewed on the [place] Controlled Substance Reporting Systemon04/13/2022 2. Bilateral progressive upper extremity hand pain/ Polyneuropathy.Continue with current medication regime. Tight Glucose Control. Adhere to healthy diet regime.Continue with  current medication regimen withnortriptyline:07/06/2020 3. Muscle Spasms: Continuecurrent medication regimen withZanaflex.Continue to Monitor.07/06/2020 4. Depression/Anxiety: Continuecurrent medication regimen withPaxil and Klonopin.Continue to Monitor.07/06/2020 5. Bilateral Chronic Knee Pain: No complaints Today.Continue HEP as Tolerated and Continue to Monitor.07/06/2020.  F/U in 1 month

## 2020-07-06 NOTE — Assessment & Plan Note (Signed)
Rash on foot appears most consistent with tinea corporis. Trial Lotrisone cream BID x 2-3 weeks. If no improvement, encouraged to follow up for further evaluation.

## 2020-07-07 ENCOUNTER — Other Ambulatory Visit: Payer: Medicaid Other

## 2020-07-08 ENCOUNTER — Encounter: Payer: Self-pay | Admitting: Family Medicine

## 2020-07-11 ENCOUNTER — Other Ambulatory Visit: Payer: Self-pay | Admitting: Family Medicine

## 2020-07-11 DIAGNOSIS — E878 Other disorders of electrolyte and fluid balance, not elsewhere classified: Secondary | ICD-10-CM

## 2020-07-11 NOTE — Progress Notes (Deleted)
abg

## 2020-07-12 LAB — TOXASSURE SELECT,+ANTIDEPR,UR

## 2020-07-13 ENCOUNTER — Telehealth: Payer: Self-pay | Admitting: *Deleted

## 2020-07-13 NOTE — Telephone Encounter (Signed)
Urine drug screen for this encounter is consistent for prescribed medication 

## 2020-07-15 ENCOUNTER — Encounter: Payer: Self-pay | Admitting: Gastroenterology

## 2020-07-15 ENCOUNTER — Other Ambulatory Visit: Payer: Medicaid Other

## 2020-07-19 ENCOUNTER — Other Ambulatory Visit: Payer: Self-pay | Admitting: Family Medicine

## 2020-07-19 ENCOUNTER — Other Ambulatory Visit: Payer: Medicaid Other

## 2020-07-19 ENCOUNTER — Encounter: Payer: Self-pay | Admitting: Family Medicine

## 2020-07-19 DIAGNOSIS — E1152 Type 2 diabetes mellitus with diabetic peripheral angiopathy with gangrene: Secondary | ICD-10-CM

## 2020-07-19 MED ORDER — ACCU-CHEK GUIDE W/DEVICE KIT: 1.0000 | PACK | Freq: Every day | 1 refills | Status: DC

## 2020-07-19 NOTE — Telephone Encounter (Signed)
Attempted to call patient to gather more information. No answer. Left HIPAA compliant VM for patient to return call to office to discuss further.   Talbot Grumbling, RN

## 2020-07-20 ENCOUNTER — Other Ambulatory Visit: Payer: Medicaid Other

## 2020-07-21 ENCOUNTER — Telehealth: Payer: Self-pay | Admitting: Registered Nurse

## 2020-07-21 MED ORDER — NORTRIPTYLINE HCL 50 MG PO CAPS: 50.0000 mg | ORAL_CAPSULE | Freq: Every day | ORAL | 1 refills | Status: DC

## 2020-07-21 NOTE — Telephone Encounter (Signed)
Medication Refill: Nortriptyline sent to pharmacy. [name] is aware via My-Chart Message.

## 2020-07-22 ENCOUNTER — Other Ambulatory Visit: Payer: Self-pay | Admitting: Family Medicine

## 2020-07-22 DIAGNOSIS — E119 Type 2 diabetes mellitus without complications: Secondary | ICD-10-CM

## 2020-08-02 ENCOUNTER — Other Ambulatory Visit: Payer: Self-pay

## 2020-08-02 ENCOUNTER — Encounter: Payer: Medicaid Other | Attending: Physical Medicine & Rehabilitation | Admitting: Registered Nurse

## 2020-08-02 ENCOUNTER — Other Ambulatory Visit: Payer: Medicaid Other

## 2020-08-02 ENCOUNTER — Encounter: Payer: Self-pay | Admitting: Registered Nurse

## 2020-08-02 VITALS — BP 138/81 | HR 120 | Temp 98.6°F | Ht 72.0 in | Wt 279.0 lb

## 2020-08-02 DIAGNOSIS — F341 Dysthymic disorder: Secondary | ICD-10-CM | POA: Diagnosis not present

## 2020-08-02 DIAGNOSIS — R000 Tachycardia, unspecified: Secondary | ICD-10-CM | POA: Insufficient documentation

## 2020-08-02 DIAGNOSIS — I152 Hypertension secondary to endocrine disorders: Secondary | ICD-10-CM | POA: Diagnosis not present

## 2020-08-02 DIAGNOSIS — M792 Neuralgia and neuritis, unspecified: Secondary | ICD-10-CM | POA: Diagnosis not present

## 2020-08-02 DIAGNOSIS — Z5181 Encounter for therapeutic drug level monitoring: Secondary | ICD-10-CM | POA: Diagnosis not present

## 2020-08-02 DIAGNOSIS — G894 Chronic pain syndrome: Secondary | ICD-10-CM | POA: Diagnosis not present

## 2020-08-02 DIAGNOSIS — E1159 Type 2 diabetes mellitus with other circulatory complications: Secondary | ICD-10-CM | POA: Diagnosis not present

## 2020-08-02 DIAGNOSIS — E781 Pure hyperglyceridemia: Secondary | ICD-10-CM

## 2020-08-02 DIAGNOSIS — Z79891 Long term (current) use of opiate analgesic: Secondary | ICD-10-CM | POA: Diagnosis not present

## 2020-08-02 DIAGNOSIS — E1140 Type 2 diabetes mellitus with diabetic neuropathy, unspecified: Secondary | ICD-10-CM | POA: Diagnosis not present

## 2020-08-02 DIAGNOSIS — M62838 Other muscle spasm: Secondary | ICD-10-CM | POA: Insufficient documentation

## 2020-08-02 MED ORDER — MORPHINE SULFATE 30 MG PO TABS: ORAL_TABLET | ORAL | 0 refills | Status: DC

## 2020-08-02 MED ORDER — MORPHINE SULFATE ER 30 MG PO TBCR: 30.0000 mg | EXTENDED_RELEASE_TABLET | Freq: Two times a day (BID) | ORAL | 0 refills | Status: DC

## 2020-08-02 NOTE — Progress Notes (Signed)
Subjective:    Patient ID: [name], female    DOB: November 21, 1970, 50 y.o.   MRN: [id number]  HPI: [name] is a 50 y.o. female who returns for follow up appointment for chronic pain and medication refill. She states her pain is located in her bilateral hands and bilateral feet with tingling and burning. She rates her pain 7. Her current exercise regime is walking, riding her stationary bicycle  For 20 minutes 2- 3 times a week  and performing stretching exercises.  [name] arrived to office tachycardic, apical pulse, she states she will F/U with her PCP. She has an appointment today,she reports..  [name] Morphine equivalent is 120.00  MME.  Last UDS was Performed on 07/06/2020, it was consistent.    Pain Inventory Average Pain 6 Pain Right Now 7 My pain is constant, sharp, burning, dull, stabbing, tingling and aching  In the last 24 hours, has pain interfered with the following? General activity 9 Relation with others 9 Enjoyment of life 9 What TIME of day is your pain at its worst? morning  and night Sleep (in general) Poor  Pain is worse with: walking, bending, standing and some activites Pain improves with: rest, therapy/exercise and medication Relief from Meds: 6  Family History  Problem Relation Age of Onset  . Fibromyalgia Mother   . Aortic aneurysm Mother   . Stroke Maternal Grandmother   . Colon cancer Maternal Grandmother   . Heart Problems Paternal Grandmother   . Dementia Paternal Grandmother   . Heart attack Paternal Grandfather    Social History   Socioeconomic History  . Marital status: Legally Separated    Spouse name: Not on file  . Number of children: 1  . Years of education: Not on file  . Highest education level: Bachelor's degree (e.g., BA, AB, BS)  Occupational History  . Occupation: Disabled  Tobacco Use  . Smoking status: Never Smoker  . Smokeless tobacco: Never Used  Vaping Use  . Vaping Use: Never used   Substance and Sexual Activity  . Alcohol use: No    Alcohol/week: 0.0 standard drinks  . Drug use: No    Comment: rx opiods  . Sexual activity: Yes    Birth control/protection: Condom  Other Topics Concern  . Not on file  Social History Narrative  . Not on file   Social Determinants of Health   Financial Resource Strain: Not on file  Food Insecurity: Not on file  Transportation Needs: Not on file  Physical Activity: Not on file  Stress: Not on file  Social Connections: Not on file   Past Surgical History:  Procedure Laterality Date  . ABDOMINOPLASTY    . CHOLECYSTECTOMY     Past Surgical History:  Procedure Laterality Date  . ABDOMINOPLASTY    . CHOLECYSTECTOMY     Past Medical History:  Diagnosis Date  . DEPRESSION/ANXIETY 08/08/2009  . Diabetes mellitus without complication ([place])   . IBS 02/02/2010  . Neuropathy   . Polyneuropathy 12/04/2010  . Renal stones   . 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  Opioid Risk Score:   Fall Risk Score:  `1  Depression screen PHQ 2/9  Depression screen [facility] 2/9 07/06/2020 06/08/2020 05/11/2020 01/15/2020 10/26/2019 06/05/2019 10/13/2018  Decreased Interest 1 1 1 2 3 3 2   Down, Depressed, Hopeless 1 1 1 2 3  3  2  PHQ - 2 Score 2 2 2 4 6 6 4   Altered sleeping - - - 3 - 3 -  Tired, decreased energy - - - 3 - 3 -  Change in appetite - - - 0 - 0 -  Feeling bad or failure about yourself  - - - 3 - 3 -  Trouble concentrating - - - 1 - 0 -  Moving slowly or fidgety/restless - - - 0 - 0 -  Suicidal thoughts - - - 0 - 0 -  PHQ-9 Score - - - 14 - 15 -  Difficult doing work/chores - - - - - Somewhat difficult -  Some recent data might be hidden     Review of Systems  Constitutional: Negative.   HENT: Negative.   Eyes: Negative.   Respiratory: Negative.   Cardiovascular: Negative.   Gastrointestinal:  Negative.   Endocrine: Negative.   Genitourinary: Negative.   Musculoskeletal: Positive for back pain and gait problem.       Pain in both feet and hands  Allergic/Immunologic: Negative.   Neurological: Positive for weakness and numbness.       Tingling   Hematological: Negative.   Psychiatric/Behavioral: Positive for dysphoric mood. The patient is nervous/anxious.   All other systems reviewed and are negative.      Objective:   Physical Exam Vitals and nursing note reviewed.  Cardiovascular:     Rate and Rhythm: Regular rhythm. Tachycardia present.     Pulses: Normal pulses.     Heart sounds: Normal heart sounds.  Pulmonary:     Effort: Pulmonary effort is normal.     Breath sounds: Normal breath sounds.  Musculoskeletal:     Cervical back: Normal range of motion and neck supple.     Comments: Normal Muscle Bulk and Muscle Testing Reveals:  Upper Extremities: Full ROM and Muscle Strength 5/5 Lower Extremities: Full ROM and Muscle Strength 5/5 Arises from Table slowly Narrow Based Gait   Skin:    General: Skin is warm and dry.  Neurological:     Mental Status: She is alert and oriented to person, place, and time.  Psychiatric:        Mood and Affect: Mood normal.        Behavior: Behavior normal.           Assessment & Plan:  1.Severe peripheral polyneuropathy of undetermined etiology.:08/02/2020 Refilled: MS Contin30 mg one tablet every 12 hours #60 andMSIR 30mg  one tablet at HS, may take a tablet during the day as needed #60. We will continue the opioid monitoring program, this consists of regular clinic visits, examinations, urine drug screen, pill counts as well as use of [place] Controlled Substance Reporting system. A 12 month History has been reviewed on the [place] Controlled Substance Reporting Systemon05/12/2020 2. Bilateral progressive upper extremity hand pain/ Polyneuropathy.Continue with current medication regime. Tight Glucose  Control. Adhere to healthy diet regime.Continue with current medication regimen withnortriptyline:05/102022 3. Muscle Spasms: Continuecurrent medication regimen withZanaflex.Continue to Monitor.08/02/2020 4. Depression/Anxiety: Continuecurrent medication regimen withPaxil and Klonopin.Continue to Monitor.08/02/2020 5. Bilateral Chronic Knee Pain: No complaints Today.Continue HEP as Tolerated and Continue to Monitor.08/02/2020.  F/U in 1 month

## 2020-08-03 ENCOUNTER — Encounter: Payer: Self-pay | Admitting: Family Medicine

## 2020-08-03 LAB — COMPREHENSIVE METABOLIC PANEL
ALT: 33 IU/L — ABNORMAL HIGH (ref 0–32)
AST: 23 IU/L (ref 0–40)
Albumin/Globulin Ratio: 2.1 (ref 1.2–2.2)
Albumin: 5.1 g/dL — ABNORMAL HIGH (ref 3.8–4.9)
Alkaline Phosphatase: 47 IU/L (ref 44–121)
BUN/Creatinine Ratio: 25 — ABNORMAL HIGH (ref 9–23)
BUN: 20 mg/dL (ref 6–24)
Bilirubin Total: 0.3 mg/dL (ref 0.0–1.2)
CO2: 22 mmol/L (ref 20–29)
Calcium: 11 mg/dL — ABNORMAL HIGH (ref 8.7–10.2)
Chloride: 96 mmol/L (ref 96–106)
Creatinine, Ser: 0.8 mg/dL (ref 0.57–1.00)
Globulin, Total: 2.4 g/dL (ref 1.5–4.5)
Glucose: 201 mg/dL — ABNORMAL HIGH (ref 65–99)
Potassium: 4.2 mmol/L (ref 3.5–5.2)
Sodium: 141 mmol/L (ref 134–144)
Total Protein: 7.5 g/dL (ref 6.0–8.5)
eGFR: 89 mL/min/{1.73_m2} (ref >59)

## 2020-08-03 LAB — LIPID PANEL
Chol/HDL Ratio: 3.8 ratio (ref 0.0–4.4)
Cholesterol, Total: 134 mg/dL (ref 100–199)
HDL: 35 mg/dL — ABNORMAL LOW (ref >39)
LDL Chol Calc (NIH): 33 mg/dL (ref 0–99)
Triglycerides: 473 mg/dL — ABNORMAL HIGH (ref 0–149)
VLDL Cholesterol Cal: 66 mg/dL — ABNORMAL HIGH (ref 5–40)

## 2020-08-05 ENCOUNTER — Encounter: Payer: Self-pay | Admitting: Family Medicine

## 2020-08-09 ENCOUNTER — Other Ambulatory Visit: Payer: Self-pay | Admitting: Family Medicine

## 2020-08-09 ENCOUNTER — Encounter: Payer: Self-pay | Admitting: Family Medicine

## 2020-08-09 DIAGNOSIS — R0683 Snoring: Secondary | ICD-10-CM

## 2020-08-09 DIAGNOSIS — R000 Tachycardia, unspecified: Secondary | ICD-10-CM

## 2020-08-21 ENCOUNTER — Other Ambulatory Visit: Payer: Self-pay | Admitting: Family Medicine

## 2020-08-21 DIAGNOSIS — E781 Pure hyperglyceridemia: Secondary | ICD-10-CM

## 2020-08-25 ENCOUNTER — Other Ambulatory Visit: Payer: Self-pay | Admitting: Registered Nurse

## 2020-08-30 ENCOUNTER — Encounter: Payer: Medicaid Other | Admitting: Registered Nurse

## 2020-08-31 ENCOUNTER — Other Ambulatory Visit: Payer: Self-pay | Admitting: Family Medicine

## 2020-08-31 ENCOUNTER — Ambulatory Visit: Payer: Medicaid Other | Admitting: Obstetrics

## 2020-09-01 ENCOUNTER — Other Ambulatory Visit: Payer: Self-pay | Admitting: Family Medicine

## 2020-09-01 DIAGNOSIS — E118 Type 2 diabetes mellitus with unspecified complications: Secondary | ICD-10-CM

## 2020-09-04 ENCOUNTER — Encounter: Payer: Self-pay | Admitting: Family Medicine

## 2020-09-05 ENCOUNTER — Telehealth: Payer: Self-pay | Admitting: Registered Nurse

## 2020-09-05 DIAGNOSIS — F341 Dysthymic disorder: Secondary | ICD-10-CM

## 2020-09-05 MED ORDER — MORPHINE SULFATE 30 MG PO TABS: ORAL_TABLET | ORAL | 0 refills | Status: DC

## 2020-09-05 MED ORDER — MORPHINE SULFATE ER 30 MG PO TBCR: 30.0000 mg | EXTENDED_RELEASE_TABLET | Freq: Two times a day (BID) | ORAL | 0 refills | Status: DC

## 2020-09-05 MED ORDER — CLONAZEPAM 0.5 MG PO TABS: ORAL_TABLET | ORAL | 2 refills | Status: DC

## 2020-09-05 NOTE — Telephone Encounter (Signed)
PMP was Reviewed. Medications e-scribed today.  [name] is aware of the above via My-Chart

## 2020-09-06 ENCOUNTER — Ambulatory Visit: Payer: Medicaid Other

## 2020-09-06 ENCOUNTER — Encounter: Payer: Self-pay | Admitting: *Deleted

## 2020-09-06 ENCOUNTER — Telehealth: Payer: Self-pay | Admitting: *Deleted

## 2020-09-06 NOTE — Telephone Encounter (Signed)
Prior auth submitted to [facility] [facility] for Morphine Sulfate ER 30 mg #60.  Approval received [id number]. MORPHINE SUL TAB 30MG  ER is approved through 12/07/2020

## 2020-09-07 ENCOUNTER — Encounter: Payer: Medicaid Other | Admitting: Registered Nurse

## 2020-09-08 ENCOUNTER — Encounter: Payer: Self-pay | Admitting: *Deleted

## 2020-09-08 ENCOUNTER — Encounter: Payer: Medicaid Other | Admitting: Registered Nurse

## 2020-09-13 ENCOUNTER — Other Ambulatory Visit: Payer: Self-pay | Admitting: Family Medicine

## 2020-09-13 DIAGNOSIS — E118 Type 2 diabetes mellitus with unspecified complications: Secondary | ICD-10-CM

## 2020-09-14 ENCOUNTER — Encounter: Payer: Medicaid Other | Attending: Physical Medicine & Rehabilitation | Admitting: Registered Nurse

## 2020-09-14 ENCOUNTER — Other Ambulatory Visit: Payer: Self-pay

## 2020-09-14 VITALS — BP 105/64 | HR 99 | Temp 98.5°F | Ht 72.0 in | Wt 283.0 lb

## 2020-09-14 DIAGNOSIS — Z5181 Encounter for therapeutic drug level monitoring: Secondary | ICD-10-CM | POA: Diagnosis not present

## 2020-09-14 DIAGNOSIS — E1140 Type 2 diabetes mellitus with diabetic neuropathy, unspecified: Secondary | ICD-10-CM | POA: Diagnosis not present

## 2020-09-14 DIAGNOSIS — M62838 Other muscle spasm: Secondary | ICD-10-CM | POA: Diagnosis not present

## 2020-09-14 DIAGNOSIS — Z79891 Long term (current) use of opiate analgesic: Secondary | ICD-10-CM

## 2020-09-14 DIAGNOSIS — G894 Chronic pain syndrome: Secondary | ICD-10-CM | POA: Diagnosis not present

## 2020-09-14 DIAGNOSIS — F341 Dysthymic disorder: Secondary | ICD-10-CM

## 2020-09-14 DIAGNOSIS — M792 Neuralgia and neuritis, unspecified: Secondary | ICD-10-CM | POA: Diagnosis not present

## 2020-09-14 MED ORDER — MORPHINE SULFATE 30 MG PO TABS: ORAL_TABLET | ORAL | 0 refills | Status: DC

## 2020-09-14 MED ORDER — MORPHINE SULFATE ER 30 MG PO TBCR: 30.0000 mg | EXTENDED_RELEASE_TABLET | Freq: Two times a day (BID) | ORAL | 0 refills | Status: DC

## 2020-09-14 NOTE — Progress Notes (Signed)
Subjective:    Patient ID: [name], female    DOB: 04/06/1970, 50 y.o.   MRN: [id number]  HPI: [name] is a 50 y.o. female who returns for follow up appointment for chronic pain and medication refill. She states her  pain is located in  her bilateral hands and bilateral feet with tingling and burning. She rates her pain 6. Her current exercise regime is walking and riding her stationary bicycle twice a week 20 minutes.  [name] Morphine equivalent is 120.00 MME.  She  is also prescribed Clonazepam. We have discussed the black box warning of using opioids and benzodiazepines. I highlighted the dangers of using these drugs together and discussed the adverse events including respiratory suppression, overdose, cognitive impairment and importance of compliance with current regimen. We will continue to monitor and adjust as indicated.    Last UDS was Performed on 07/07/2020, it was consistent.     Pain Inventory Average Pain 6 Pain Right Now 6 My pain is constant, sharp, burning, dull, stabbing, tingling, and aching  In the last 24 hours, has pain interfered with the following? General activity 9 Relation with others 9 Enjoyment of life 9 What TIME of day is your pain at its worst? morning  and evening Sleep (in general) Poor  Pain is worse with: walking, bending, standing, and some activites Pain improves with: rest and medication Relief from Meds: 7  Family History  Problem Relation Age of Onset   Fibromyalgia Mother    Aortic aneurysm Mother    Stroke Maternal Grandmother    Colon cancer Maternal Grandmother    Heart Problems Paternal Grandmother    Dementia Paternal Grandmother    Heart attack Paternal Grandfather    Social History   Socioeconomic History   Marital status: Legally Separated    Spouse name: Not on file   Number of children: 1   Years of education: Not on file   Highest education level: Bachelor's degree (e.g., BA, AB, BS)   Occupational History   Occupation: Disabled  Tobacco Use   Smoking status: Never   Smokeless tobacco: Never  Vaping Use   Vaping Use: Never used  Substance and Sexual Activity   Alcohol use: No    Alcohol/week: 0.0 standard drinks   Drug use: No    Comment: rx opiods   Sexual activity: Yes    Birth control/protection: Condom  Other Topics Concern   Not on file  Social History Narrative   Not on file   Social Determinants of Health   Financial Resource Strain: Not on file  Food Insecurity: Not on file  Transportation Needs: Not on file  Physical Activity: Not on file  Stress: Not on file  Social Connections: Not on file   Past Surgical History:  Procedure Laterality Date   ABDOMINOPLASTY     CHOLECYSTECTOMY     Past Surgical History:  Procedure Laterality Date   ABDOMINOPLASTY     CHOLECYSTECTOMY     Past Medical History:  Diagnosis Date   DEPRESSION/ANXIETY 08/08/2009   Diabetes mellitus without complication ([place])    IBS 02/02/2010   Neuropathy    Polyneuropathy 12/04/2010   Renal stones    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) 08/08/2009   BP 105/64 (BP Location: Right Arm, Patient Position: Sitting, Cuff Size: Large)   Pulse 99   Temp 98.5 F (36.9 C) (Oral)   Ht 6' (1.829 m)  Wt 283 lb (128.4 kg)   SpO2 97%   BMI 38.38 kg/m   Opioid Risk Score:   Fall Risk Score:  `1  Depression screen PHQ 2/9  Depression screen [facility] 2/9 07/06/2020 06/08/2020 05/11/2020 01/15/2020 10/26/2019 06/05/2019 10/13/2018  Decreased Interest 1 1 1 2 3 3 2   Down, Depressed, Hopeless 1 1 1 2 3 3 2   PHQ - 2 Score 2 2 2 4 6 6 4   Altered sleeping - - - 3 - 3 -  Tired, decreased energy - - - 3 - 3 -  Change in appetite - - - 0 - 0 -  Feeling bad or failure about yourself  - - - 3 - 3 -  Trouble concentrating - - - 1 - 0 -  Moving slowly or fidgety/restless - - - 0 - 0 -  Suicidal thoughts - - - 0 - 0 -   PHQ-9 Score - - - 14 - 15 -  Difficult doing work/chores - - - - - Somewhat difficult -  Some recent data might be hidden     Review of Systems  Musculoskeletal:  Positive for back pain.       Hand and feet pain  Neurological:  Positive for weakness and numbness.       Tingling  All other systems reviewed and are negative.     Objective:   Physical Exam Vitals and nursing note reviewed.  Constitutional:      Appearance: Normal appearance.  Cardiovascular:     Rate and Rhythm: Normal rate and regular rhythm.     Pulses: Normal pulses.     Heart sounds: Normal heart sounds.  Pulmonary:     Effort: Pulmonary effort is normal.     Breath sounds: Normal breath sounds.  Musculoskeletal:     Cervical back: Normal range of motion and neck supple.     Comments: Normal Muscle Bulk and Muscle Testing Reveals:  Upper Extremities: Full ROM and Muscle Strength  5/5  Lower Extremities: Full ROM and Muscle Strength 5/5 Arises from Table with ease Narrow Based  Gait     Skin:    General: Skin is warm and dry.  Neurological:     Mental Status: She is alert and oriented to person, place, and time.  Psychiatric:        Mood and Affect: Mood normal.        Behavior: Behavior normal.         Assessment & Plan:  1.Severe peripheral polyneuropathy of undetermined etiology.: 09/14/2020 Refilled: MS Contin 30 mg one tablet every 12 hours #60 and  MSIR 30 mg one tablet at HS, may take a tablet during the day as needed #60. We will continue the opioid monitoring program, this consists of regular clinic visits, examinations, urine drug screen, pill counts as well as use of [place] Controlled Substance Reporting system. A 12 month History has been reviewed on the [place] Controlled Substance Reporting System on 09/14/2020 2. Bilateral progressive upper extremity hand pain/ Polyneuropathy. Continue with current medication regime. Tight Glucose Control. Adhere to healthy diet regime.  Continue with current medication regimen with nortriptyline: 09/14/2020 3. Muscle Spasms: Continue current medication regimen with  Zanaflex. Continue to Monitor.  09/14/2020 4. Depression/Anxiety: Continue current medication regimen with Paxil and  Klonopin. Continue to Monitor. 09/14/2020  5. Bilateral Chronic Knee Pain: No complaints Today.Continue HEP as Tolerated and Continue to Monitor. 09/14/2020.   F/U in 1 month

## 2020-09-19 ENCOUNTER — Encounter: Payer: Self-pay | Admitting: Registered Nurse

## 2020-09-21 ENCOUNTER — Encounter: Payer: Medicaid Other | Admitting: Gastroenterology

## 2020-10-06 ENCOUNTER — Ambulatory Visit: Payer: Medicaid Other | Admitting: Gastroenterology

## 2020-10-18 ENCOUNTER — Encounter: Payer: Medicaid Other | Attending: Physical Medicine & Rehabilitation | Admitting: Registered Nurse

## 2020-10-18 ENCOUNTER — Other Ambulatory Visit: Payer: Self-pay

## 2020-10-18 VITALS — BP 129/83 | HR 93 | Temp 98.2°F | Ht 72.0 in | Wt 285.6 lb

## 2020-10-18 DIAGNOSIS — M792 Neuralgia and neuritis, unspecified: Secondary | ICD-10-CM | POA: Diagnosis not present

## 2020-10-18 DIAGNOSIS — Z5181 Encounter for therapeutic drug level monitoring: Secondary | ICD-10-CM | POA: Diagnosis not present

## 2020-10-18 DIAGNOSIS — E1140 Type 2 diabetes mellitus with diabetic neuropathy, unspecified: Secondary | ICD-10-CM | POA: Diagnosis not present

## 2020-10-18 DIAGNOSIS — F341 Dysthymic disorder: Secondary | ICD-10-CM

## 2020-10-18 DIAGNOSIS — G894 Chronic pain syndrome: Secondary | ICD-10-CM

## 2020-10-18 DIAGNOSIS — Z79891 Long term (current) use of opiate analgesic: Secondary | ICD-10-CM | POA: Diagnosis not present

## 2020-10-18 DIAGNOSIS — M62838 Other muscle spasm: Secondary | ICD-10-CM | POA: Diagnosis not present

## 2020-10-18 MED ORDER — MORPHINE SULFATE 30 MG PO TABS: ORAL_TABLET | ORAL | 0 refills | Status: DC

## 2020-10-18 MED ORDER — MORPHINE SULFATE ER 30 MG PO TBCR: 30.0000 mg | EXTENDED_RELEASE_TABLET | Freq: Two times a day (BID) | ORAL | 0 refills | Status: DC

## 2020-10-18 NOTE — Progress Notes (Signed)
Subjective:    Patient ID: [name], female    DOB: 1970/05/08, 50 y.o.   MRN: [id number]  HPI: [name] is a 50 y.o. female who returns for follow up appointment for chronic pain and medication refill. She states her pain is located in her bilateral hands and bilateral feet. He rates his pain 9. Her current exercise regime is walking and performing stretching exercises.  [name] Morphine equivalent is 120.00  MME. She  is also prescribed Clonazepam. We have discussed the black box warning of using opioids and benzodiazepines. I highlighted the dangers of using these drugs together and discussed the adverse events including respiratory suppression, overdose, cognitive impairment and importance of compliance with current regimen. We will continue to monitor and adjust as indicated.      Last UDS was Performed on 07/06/2020, it was consistent.      Pain Inventory Average Pain 8 Pain Right Now 9 My pain is constant, sharp, burning, dull, stabbing, tingling, and aching  In the last 24 hours, has pain interfered with the following? General activity 9 Relation with others 9 Enjoyment of life 10 What TIME of day is your pain at its worst? morning  Sleep (in general) Poor  Pain is worse with: walking, bending, standing, and some activites Pain improves with: rest and medication Relief from Meds: 8  Family History  Problem Relation Age of Onset   Fibromyalgia Mother    Aortic aneurysm Mother    Stroke Maternal Grandmother    Colon cancer Maternal Grandmother    Heart Problems Paternal Grandmother    Dementia Paternal Grandmother    Heart attack Paternal Grandfather    Social History   Socioeconomic History   Marital status: Legally Separated    Spouse name: Not on file   Number of children: 1   Years of education: Not on file   Highest education level: Bachelor's degree (e.g., BA, AB, BS)  Occupational History   Occupation: Disabled  Tobacco Use    Smoking status: Never   Smokeless tobacco: Never  Vaping Use   Vaping Use: Never used  Substance and Sexual Activity   Alcohol use: No    Alcohol/week: 0.0 standard drinks   Drug use: No    Comment: rx opiods   Sexual activity: Yes    Birth control/protection: Condom  Other Topics Concern   Not on file  Social History Narrative   Not on file   Social Determinants of Health   Financial Resource Strain: Not on file  Food Insecurity: Not on file  Transportation Needs: Not on file  Physical Activity: Not on file  Stress: Not on file  Social Connections: Not on file   Past Surgical History:  Procedure Laterality Date   ABDOMINOPLASTY     CHOLECYSTECTOMY     Past Surgical History:  Procedure Laterality Date   ABDOMINOPLASTY     CHOLECYSTECTOMY     Past Medical History:  Diagnosis Date   DEPRESSION/ANXIETY 08/08/2009   Diabetes mellitus without complication ([place])    IBS 02/02/2010   Neuropathy    Polyneuropathy 12/04/2010   Renal stones    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) 08/08/2009   BP 129/83   Pulse 93   Temp 98.2 F (36.8 C)   Ht 6' (1.829 m)   Wt 285 lb 9.6 oz (129.5 kg)   SpO2 98%   BMI 38.73 kg/m   Opioid Risk Score:  Fall Risk Score:  `1  Depression screen PHQ 2/9  Depression screen [facility] 2/9 07/06/2020 06/08/2020 05/11/2020 01/15/2020 10/26/2019 06/05/2019 10/13/2018  Decreased Interest '1 1 1 2 3 3 2  '$ Down, Depressed, Hopeless '1 1 1 2 3 3 2  '$ PHQ - 2 Score '2 2 2 4 6 6 4  '$ Altered sleeping - - - 3 - 3 -  Tired, decreased energy - - - 3 - 3 -  Change in appetite - - - 0 - 0 -  Feeling bad or failure about yourself  - - - 3 - 3 -  Trouble concentrating - - - 1 - 0 -  Moving slowly or fidgety/restless - - - 0 - 0 -  Suicidal thoughts - - - 0 - 0 -  PHQ-9 Score - - - 14 - 15 -  Difficult doing work/chores - - - - - Somewhat difficult -  Some recent data might be hidden       Review of Systems  Constitutional: Negative.   HENT: Negative.    Eyes: Negative.   Respiratory: Negative.    Cardiovascular: Negative.   Gastrointestinal: Negative.   Endocrine: Negative.   Genitourinary: Negative.   Musculoskeletal:  Positive for gait problem.       Pain in both legs, pain in both arms,   Skin: Negative.   Allergic/Immunologic: Negative.   Hematological: Negative.   Psychiatric/Behavioral: Negative.        Objective:   Physical Exam Vitals and nursing note reviewed.  Constitutional:      Appearance: Normal appearance.  Cardiovascular:     Rate and Rhythm: Normal rate and regular rhythm.     Pulses: Normal pulses.     Heart sounds: Normal heart sounds.  Musculoskeletal:     Cervical back: Normal range of motion and neck supple.     Comments: Normal Muscle Bulk and Muscle Testing Reveals:  Upper Extremities: Full ROM and Muscle Strength 5/5 Lower Extremities: Full ROM and Muscle Strength 5/5 Arises from Table slowly using cane for support Narrow Based  Gait     Skin:    General: Skin is warm and dry.  Neurological:     Mental Status: She is alert and oriented to person, place, and time.  Psychiatric:        Mood and Affect: Mood normal.        Behavior: Behavior normal.         Assessment & Plan:  1.Severe peripheral polyneuropathy of undetermined etiology.: 10/18/2020 Refilled: MS Contin 30 mg one tablet every 12 hours #60 and  MSIR 30 mg one tablet at HS, may take a tablet during the day as needed #60. We will continue the opioid monitoring program, this consists of regular clinic visits, examinations, urine drug screen, pill counts as well as use of [place] Controlled Substance Reporting system. A 12 month History has been reviewed on the [place] Controlled Substance Reporting System on 10/18/2020 2. Bilateral progressive upper extremity hand pain/ Polyneuropathy. Continue with current medication regime. Tight Glucose Control.  Adhere to healthy diet regime. Continue with current medication regimen with nortriptyline: 10/18/2020 3. Muscle Spasms: Continue current medication regimen with  Zanaflex. Continue to Monitor.  10/18/2020 4. Depression/Anxiety: Continue current medication regimen with Paxil and  Klonopin. Continue to Monitor. 10/18/2020  5. Bilateral Chronic Knee Pain: No complaints Today.Continue HEP as Tolerated and Continue to Monitor. 10/18/2020.   F/U in 1 month

## 2020-10-20 ENCOUNTER — Ambulatory Visit: Payer: Medicaid Other | Admitting: Interventional Cardiology

## 2020-11-02 ENCOUNTER — Telehealth: Payer: Self-pay | Admitting: *Deleted

## 2020-11-02 NOTE — Telephone Encounter (Addendum)
Prior auth for Morphine Sulfate ER 30 mg submitted to [facility] via CoverMyMeds. Approval given through 02/02/21.  I requested a reason for why only 2 months, they said new clinical guideline for [place].  Mychart message sent to [facility] and pharmacy notified.

## 2020-11-03 ENCOUNTER — Other Ambulatory Visit: Payer: Self-pay | Admitting: Family Medicine

## 2020-11-03 ENCOUNTER — Encounter: Payer: Self-pay | Admitting: *Deleted

## 2020-11-03 DIAGNOSIS — E118 Type 2 diabetes mellitus with unspecified complications: Secondary | ICD-10-CM

## 2020-11-05 ENCOUNTER — Other Ambulatory Visit: Payer: Self-pay | Admitting: Registered Nurse

## 2020-11-21 ENCOUNTER — Other Ambulatory Visit: Payer: Self-pay

## 2020-11-21 ENCOUNTER — Encounter: Payer: Self-pay | Admitting: Registered Nurse

## 2020-11-21 ENCOUNTER — Encounter: Payer: Medicaid Other | Attending: Physical Medicine & Rehabilitation | Admitting: Registered Nurse

## 2020-11-21 VITALS — BP 139/83 | HR 116 | Temp 98.2°F | Ht 72.0 in | Wt 280.0 lb

## 2020-11-21 DIAGNOSIS — E1140 Type 2 diabetes mellitus with diabetic neuropathy, unspecified: Secondary | ICD-10-CM | POA: Diagnosis not present

## 2020-11-21 DIAGNOSIS — R000 Tachycardia, unspecified: Secondary | ICD-10-CM | POA: Diagnosis not present

## 2020-11-21 DIAGNOSIS — Z5181 Encounter for therapeutic drug level monitoring: Secondary | ICD-10-CM | POA: Insufficient documentation

## 2020-11-21 DIAGNOSIS — G894 Chronic pain syndrome: Secondary | ICD-10-CM | POA: Diagnosis not present

## 2020-11-21 DIAGNOSIS — Z79891 Long term (current) use of opiate analgesic: Secondary | ICD-10-CM | POA: Insufficient documentation

## 2020-11-21 DIAGNOSIS — M792 Neuralgia and neuritis, unspecified: Secondary | ICD-10-CM | POA: Diagnosis not present

## 2020-11-21 DIAGNOSIS — F341 Dysthymic disorder: Secondary | ICD-10-CM | POA: Insufficient documentation

## 2020-11-21 MED ORDER — MORPHINE SULFATE ER 30 MG PO TBCR: 30.0000 mg | EXTENDED_RELEASE_TABLET | Freq: Two times a day (BID) | ORAL | 0 refills | Status: DC

## 2020-11-21 MED ORDER — MORPHINE SULFATE 30 MG PO TABS: ORAL_TABLET | ORAL | 0 refills | Status: DC

## 2020-11-21 NOTE — Progress Notes (Signed)
Subjective:    Patient ID: [name], female    DOB: 1970-09-13, 50 y.o.   MRN: [id number]  HPI: [name] is a 50 y.o. female who returns for follow up appointment for chronic pain and medication refill. She states her pain is located in her Bilateral hands and Bilateral feet with tingling and burning, also reports her pain has intensifies over the last few weeks. She rates her pain 8. Her current exercise regime is walking and performing stretching exercises.  Ms. Nagorski arrived tachycardic, she states her PCP and Cardiologist is following. We will continue to monitor. Apical Pulse 110.  Ms. Baysinger Morphine equivalent is 120.00 MME.   She is also prescribed clonazepam. We have discussed the black box warning of using opioids and benzodiazepines. I highlighted the dangers of using these drugs together and discussed the adverse events including respiratory suppression, overdose, cognitive impairment and importance of compliance with current regimen. We will continue to monitor and adjust as indicated.    UDS ordered today    Pain Inventory Average Pain 8 Pain Right Now 8 My pain is sharp, burning, dull, stabbing, tingling, and aching  In the last 24 hours, has pain interfered with the following? General activity 8 Relation with others 8 Enjoyment of life 8 What TIME of day is your pain at its worst? morning  Sleep (in general) Poor  Pain is worse with: walking and standing Pain improves with: rest and medication Relief from Meds: 7  Family History  Problem Relation Age of Onset   Fibromyalgia Mother    Aortic aneurysm Mother    Stroke Maternal Grandmother    Colon cancer Maternal Grandmother    Heart Problems Paternal Grandmother    Dementia Paternal Grandmother    Heart attack Paternal Grandfather    Social History   Socioeconomic History   Marital status: Legally Separated    Spouse name: Not on file   Number of children: 1   Years of  education: Not on file   Highest education level: Bachelor's degree (e.g., BA, AB, BS)  Occupational History   Occupation: Disabled  Tobacco Use   Smoking status: Never   Smokeless tobacco: Never  Vaping Use   Vaping Use: Never used  Substance and Sexual Activity   Alcohol use: No    Alcohol/week: 0.0 standard drinks   Drug use: No    Comment: rx opiods   Sexual activity: Yes    Birth control/protection: Condom  Other Topics Concern   Not on file  Social History Narrative   Not on file   Social Determinants of Health   Financial Resource Strain: Not on file  Food Insecurity: Not on file  Transportation Needs: Not on file  Physical Activity: Not on file  Stress: Not on file  Social Connections: Not on file   Past Surgical History:  Procedure Laterality Date   ABDOMINOPLASTY     CHOLECYSTECTOMY     Past Surgical History:  Procedure Laterality Date   ABDOMINOPLASTY     CHOLECYSTECTOMY     Past Medical History:  Diagnosis Date   DEPRESSION/ANXIETY 08/08/2009   Diabetes mellitus without complication ([place])    IBS 02/02/2010   Neuropathy    Polyneuropathy 12/04/2010   Renal stones    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) 08/08/2009   BP 139/83   Pulse (!) 116   Temp 98.2 F (36.8 C)   Ht 6' (1.829 m)  Wt 280 lb (127 kg)   SpO2 98%   BMI 37.97 kg/m   Opioid Risk Score:   Fall Risk Score:  `1  Depression screen PHQ 2/9  Depression screen [facility] 2/9 10/18/2020 07/06/2020 06/08/2020 05/11/2020 01/15/2020 10/26/2019 06/05/2019  Decreased Interest '1 1 1 1 2 3 3  '$ Down, Depressed, Hopeless '1 1 1 1 2 3 3  '$ PHQ - 2 Score '2 2 2 2 4 6 6  '$ Altered sleeping - - - - 3 - 3  Tired, decreased energy - - - - 3 - 3  Change in appetite - - - - 0 - 0  Feeling bad or failure about yourself  - - - - 3 - 3  Trouble concentrating - - - - 1 - 0  Moving slowly or fidgety/restless - - - - 0 - 0  Suicidal thoughts -  - - - 0 - 0  PHQ-9 Score - - - - 14 - 15  Difficult doing work/chores - - - - - - Somewhat difficult  Some recent data might be hidden    Review of Systems  Constitutional: Negative.   HENT: Negative.    Eyes: Negative.   Respiratory: Negative.    Cardiovascular: Negative.   Gastrointestinal: Negative.   Endocrine: Negative.   Genitourinary: Negative.   Musculoskeletal:  Positive for arthralgias.  Skin: Negative.   Allergic/Immunologic: Negative.   Neurological: Negative.   Hematological: Negative.   Psychiatric/Behavioral: Negative.    All other systems reviewed and are negative.     Objective:   Physical Exam Vitals and nursing note reviewed.  Constitutional:      Appearance: Normal appearance. She is obese.  Cardiovascular:     Rate and Rhythm: Regular rhythm. Tachycardia present.     Pulses: Normal pulses.     Heart sounds: Normal heart sounds.  Pulmonary:     Effort: Pulmonary effort is normal.     Breath sounds: Normal breath sounds.  Musculoskeletal:     Cervical back: Normal range of motion and neck supple.     Comments: Normal Muscle Bulk and Muscle Testing Reveals: Upper Extremities: ROM and Muscle Strength Thoracic, Lumbar Lower Extremities Gait     Skin:    General: Skin is warm and dry.  Neurological:     Mental Status: She is alert and oriented to person, place, and time.  Psychiatric:        Mood and Affect: Mood normal.        Behavior: Behavior normal.          Assessment & Plan:  1.Severe peripheral polyneuropathy of undetermined etiology.: 11/21/2020 Refilled: MS Contin 30 mg one tablet every 12 hours #60 and  MSIR 30 mg one tablet at HS, may take a tablet during the day as needed #60. We will continue the opioid monitoring program, this consists of regular clinic visits, examinations, urine drug screen, pill counts as well as use of [place] Controlled Substance Reporting system. A 12 month History has been reviewed on the [place] Controlled Substance Reporting System on 11/21/2020 2. Bilateral progressive upper extremity hand pain/ Polyneuropathy. Continue with current medication regime. Tight Glucose Control. Adhere to healthy diet regime. Continue with current medication regimen with nortriptyline: 11/21/2020 3. Muscle Spasms: Continue current medication regimen with  Zanaflex. Continue to Monitor.  11/21/2020 4. Depression/Anxiety: Continue current medication regimen with Paxil and  Klonopin. Continue to Monitor. 11/21/2020  5. Bilateral Chronic Knee Pain: No complaints Today.Continue HEP as Tolerated and  Continue to Monitor. 11/21/2020.  6. Tachycardia: Apical Pulse checked: Ms. Joens reports PCP and Cardiology following. Continue to monitor.   F/U in 1 month

## 2020-11-23 ENCOUNTER — Ambulatory Visit (HOSPITAL_BASED_OUTPATIENT_CLINIC_OR_DEPARTMENT_OTHER): Payer: Medicaid Other | Admitting: Internal Medicine

## 2020-11-24 LAB — TOXASSURE SELECT,+ANTIDEPR,UR

## 2020-11-28 ENCOUNTER — Other Ambulatory Visit: Payer: Self-pay | Admitting: Registered Nurse

## 2020-11-28 DIAGNOSIS — F341 Dysthymic disorder: Secondary | ICD-10-CM

## 2020-11-30 ENCOUNTER — Telehealth: Payer: Self-pay

## 2020-11-30 ENCOUNTER — Ambulatory Visit (HOSPITAL_BASED_OUTPATIENT_CLINIC_OR_DEPARTMENT_OTHER): Payer: Medicaid Other | Admitting: Internal Medicine

## 2020-11-30 ENCOUNTER — Telehealth: Payer: Self-pay | Admitting: *Deleted

## 2020-11-30 NOTE — Telephone Encounter (Signed)
PA created for Morhine 30 MG IR

## 2020-11-30 NOTE — Telephone Encounter (Signed)
PMP was Reviewed. Clonazepam e-scribed.

## 2020-11-30 NOTE — Telephone Encounter (Signed)
[facility] has reviewed the request for Morphine Sul Tab '30mg'$  submitted by [name] on behalf of [name] on 11/30/2020. After review, the request for service is: Approved through 05/30/2021.

## 2020-11-30 NOTE — Telephone Encounter (Signed)
Urine drug screen for this encounter is consistent for prescribed medication 

## 2020-12-02 ENCOUNTER — Other Ambulatory Visit: Payer: Self-pay | Admitting: Family Medicine

## 2020-12-02 DIAGNOSIS — E119 Type 2 diabetes mellitus without complications: Secondary | ICD-10-CM

## 2020-12-19 ENCOUNTER — Encounter: Payer: Self-pay | Admitting: Registered Nurse

## 2020-12-19 ENCOUNTER — Other Ambulatory Visit: Payer: Self-pay

## 2020-12-19 ENCOUNTER — Encounter: Payer: Medicaid Other | Attending: Physical Medicine & Rehabilitation | Admitting: Registered Nurse

## 2020-12-19 VITALS — BP 130/84 | HR 92 | Temp 97.7°F | Ht 72.0 in | Wt 282.0 lb

## 2020-12-19 DIAGNOSIS — Z5181 Encounter for therapeutic drug level monitoring: Secondary | ICD-10-CM | POA: Insufficient documentation

## 2020-12-19 DIAGNOSIS — Z79891 Long term (current) use of opiate analgesic: Secondary | ICD-10-CM

## 2020-12-19 DIAGNOSIS — E1140 Type 2 diabetes mellitus with diabetic neuropathy, unspecified: Secondary | ICD-10-CM | POA: Insufficient documentation

## 2020-12-19 DIAGNOSIS — M792 Neuralgia and neuritis, unspecified: Secondary | ICD-10-CM | POA: Diagnosis not present

## 2020-12-19 DIAGNOSIS — G894 Chronic pain syndrome: Secondary | ICD-10-CM | POA: Diagnosis not present

## 2020-12-19 DIAGNOSIS — F341 Dysthymic disorder: Secondary | ICD-10-CM | POA: Insufficient documentation

## 2020-12-19 MED ORDER — MORPHINE SULFATE 30 MG PO TABS: ORAL_TABLET | ORAL | 0 refills | Status: DC

## 2020-12-19 MED ORDER — MORPHINE SULFATE ER 30 MG PO TBCR: 30.0000 mg | EXTENDED_RELEASE_TABLET | Freq: Two times a day (BID) | ORAL | 0 refills | Status: DC

## 2020-12-19 NOTE — Progress Notes (Signed)
Subjective:    Patient ID: [name], female    DOB: Aug 16, 1970, 50 y.o.   MRN: [id number]  HPI: JHADA RISK is a 50 y.o. female who returns for follow up appointment for chronic pain and medication refill. She states her pain is located in her bilateral hands and bilateral feet with tingling and numbness. He rates his pain 6. His current exercise regime is walking and performing stretching exercises.  Ms. Rolon Morphine equivalent is 120.00 MME.  She  is also prescribed Clonazepam .We have discussed the black box warning of using opioids and benzodiazepines. I highlighted the dangers of using these drugs together and discussed the adverse events including respiratory suppression, overdose, cognitive impairment and importance of compliance with current regimen. We will continue to monitor and adjust as indicated.    Last UDS was Performed on 11/21/2020, it was consistent.     Pain Inventory Average Pain 7 Pain Right Now 6 My pain is constant, sharp, burning, dull, stabbing, tingling, and aching  In the last 24 hours, has pain interfered with the following? General activity 8 Relation with others 8 Enjoyment of life 8 What TIME of day is your pain at its worst? morning , daytime, evening, and night Sleep (in general) Poor  Pain is worse with: walking, bending, sitting, inactivity, standing, and some activites Pain improves with: medication and massage Relief from Meds: 8  Family History  Problem Relation Age of Onset   Fibromyalgia Mother    Aortic aneurysm Mother    Stroke Maternal Grandmother    Colon cancer Maternal Grandmother    Heart Problems Paternal Grandmother    Dementia Paternal Grandmother    Heart attack Paternal Grandfather    Social History   Socioeconomic History   Marital status: Legally Separated    Spouse name: Not on file   Number of children: 1   Years of education: Not on file   Highest education level: Bachelor's degree (e.g.,  BA, AB, BS)  Occupational History   Occupation: Disabled  Tobacco Use   Smoking status: Never   Smokeless tobacco: Never  Vaping Use   Vaping Use: Never used  Substance and Sexual Activity   Alcohol use: No    Alcohol/week: 0.0 standard drinks   Drug use: No    Comment: rx opiods   Sexual activity: Yes    Birth control/protection: Condom  Other Topics Concern   Not on file  Social History Narrative   Not on file   Social Determinants of Health   Financial Resource Strain: Not on file  Food Insecurity: Not on file  Transportation Needs: Not on file  Physical Activity: Not on file  Stress: Not on file  Social Connections: Not on file   Past Surgical History:  Procedure Laterality Date   ABDOMINOPLASTY     CHOLECYSTECTOMY     Past Surgical History:  Procedure Laterality Date   ABDOMINOPLASTY     CHOLECYSTECTOMY     Past Medical History:  Diagnosis Date   DEPRESSION/ANXIETY 08/08/2009   Diabetes mellitus without complication ([place])    IBS 02/02/2010   Neuropathy    Polyneuropathy 12/04/2010   Renal stones    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) 08/08/2009   BP 130/84   Pulse 92   Temp 97.7 F (36.5 C)   Ht 6' (1.829 m)   Wt 282 lb (127.9 kg)   SpO2 98%   BMI 38.25 kg/m  Opioid Risk Score:   Fall Risk Score:  `1  Depression screen PHQ 2/9  Depression screen [facility] 2/9 10/18/2020 07/06/2020 06/08/2020 05/11/2020 01/15/2020 10/26/2019 06/05/2019  Decreased Interest 1 1 1 1 2 3 3   Down, Depressed, Hopeless 1 1 1 1 2 3 3   PHQ - 2 Score 2 2 2 2 4 6 6   Altered sleeping - - - - 3 - 3  Tired, decreased energy - - - - 3 - 3  Change in appetite - - - - 0 - 0  Feeling bad or failure about yourself  - - - - 3 - 3  Trouble concentrating - - - - 1 - 0  Moving slowly or fidgety/restless - - - - 0 - 0  Suicidal thoughts - - - - 0 - 0  PHQ-9 Score - - - - 14 - 15  Difficult doing work/chores - - - -  - - Somewhat difficult  Some recent data might be hidden     Review of Systems  Musculoskeletal:  Positive for back pain and gait problem.       Pain in feet legs & hands  All other systems reviewed and are negative.     Objective:   Physical Exam Vitals and nursing note reviewed.  Constitutional:      Appearance: Normal appearance. She is obese.  Cardiovascular:     Rate and Rhythm: Normal rate and regular rhythm.     Pulses: Normal pulses.     Heart sounds: Normal heart sounds.  Pulmonary:     Effort: Pulmonary effort is normal.     Breath sounds: Normal breath sounds.  Musculoskeletal:     Cervical back: Normal range of motion and neck supple.     Comments: Normal Muscle Bulk and Muscle Testing Reveals:  Upper Extremities: Full ROM and Muscle Strength 5/5  Lumbar Paraspinal Tenderness: L-3-L-5 Lower Extremities: Full ROM and Muscle Strength 5/5 Arises from Table slowly using cane for support Narrow Based Gait     Skin:    General: Skin is warm and dry.  Neurological:     Mental Status: She is alert and oriented to person, place, and time.  Psychiatric:        Mood and Affect: Mood normal.        Behavior: Behavior normal.         Assessment & Plan:  1.Severe peripheral polyneuropathy of undetermined etiology.: 12/19/2020 Refilled: MS Contin 30 mg one tablet every 12 hours #60 and  MSIR 30 mg one tablet at HS, may take a tablet during the day as needed #60. We will continue the opioid monitoring program, this consists of regular clinic visits, examinations, urine drug screen, pill counts as well as use of [place] Controlled Substance Reporting system. A 12 month History has been reviewed on the [place] Controlled Substance Reporting System on 12/19/2020 2. Bilateral progressive upper extremity hand pain/ Polyneuropathy. Continue with current medication regime. Tight Glucose Control. Adhere to healthy diet regime. Continue with current medication regimen  with nortriptyline: 12/19/2020 3. Muscle Spasms: Continue current medication regimen with  Zanaflex. Continue to Monitor.  12/19/2020 4. Depression/Anxiety: Continue current medication regimen with Paxil and  Klonopin. Continue to Monitor. 12/19/2020  5. Bilateral Chronic Knee Pain: No complaints Today.Continue HEP as Tolerated and Continue to Monitor. 12/19/2020. .    F/U in 1 month

## 2020-12-23 ENCOUNTER — Encounter: Payer: Self-pay | Admitting: Registered Nurse

## 2020-12-26 ENCOUNTER — Other Ambulatory Visit: Payer: Self-pay | Admitting: Student

## 2020-12-26 DIAGNOSIS — E119 Type 2 diabetes mellitus without complications: Secondary | ICD-10-CM

## 2020-12-28 ENCOUNTER — Telehealth: Payer: Self-pay | Admitting: Student

## 2020-12-28 NOTE — Telephone Encounter (Signed)
..   Medicaid Managed Care   Unsuccessful Outreach Note  12/28/2020 Name: [name] MRN: [id number] DOB: 06-06-1970  Referred by: [name], DO Reason for referral : High Risk Managed Medicaid (I called the patient today to get her scheduled for a phone visit with the [facility] Team. I left my name and number on her VM.)   An unsuccessful telephone outreach was attempted today. The patient was referred to the case management team for assistance with care management and care coordination.   Follow Up Plan: The care management team will reach out to the patient again over the next 7-14 days.   [place]

## 2020-12-30 ENCOUNTER — Encounter: Payer: Self-pay | Admitting: *Deleted

## 2020-12-30 ENCOUNTER — Telehealth: Payer: Self-pay

## 2020-12-30 ENCOUNTER — Telehealth: Payer: Self-pay | Admitting: *Deleted

## 2020-12-30 NOTE — Telephone Encounter (Signed)
Received fax from pharmacy, PA needed on Victoza.  Clinical questions submitted via Cover My Meds.  Waiting on response, could take up to 72 hours.  Cover My Meds info: Key: Era Bumpers, RN

## 2020-12-30 NOTE — Telephone Encounter (Deleted)
Prior auth for Morphine Sulfate ER 30 mg #60 submitted to [employer] via [employer].

## 2020-12-30 NOTE — Telephone Encounter (Signed)
Received below message from Covermymeds.     Called pharmacy and they processed medication with override code.   Talbot Grumbling, RN

## 2020-12-30 NOTE — Telephone Encounter (Signed)
Prior auth for Morphine Sulfate ER 30 mg #60 submitted to [employer] via [employer].   Approved through 04/01/2021. Magda Paganini notified by [employer]

## 2021-01-02 ENCOUNTER — Encounter (HOSPITAL_BASED_OUTPATIENT_CLINIC_OR_DEPARTMENT_OTHER): Payer: Medicaid Other | Admitting: Internal Medicine

## 2021-01-02 ENCOUNTER — Telehealth: Payer: Self-pay | Admitting: Student

## 2021-01-02 NOTE — Telephone Encounter (Signed)
..   Medicaid Managed Care   Unsuccessful Outreach Note  01/02/2021 Name: [name] MRN: [id number] DOB: 06-14-1970  Referred by: [name], DO Reason for referral : High Risk Managed Medicaid (I called the patient today to get her scheduled for a phone visit with the [place]. I left my name and number on her VM.)   A second unsuccessful telephone outreach was attempted today. The patient was referred to the case management team for assistance with care management and care coordination.   Follow Up Plan: The care management team will reach out to the patient again over the next 7-14 days.   [place]

## 2021-01-03 ENCOUNTER — Other Ambulatory Visit: Payer: Self-pay

## 2021-01-03 ENCOUNTER — Encounter: Payer: Self-pay | Admitting: Interventional Cardiology

## 2021-01-03 ENCOUNTER — Ambulatory Visit (INDEPENDENT_AMBULATORY_CARE_PROVIDER_SITE_OTHER): Payer: Medicaid Other | Admitting: Interventional Cardiology

## 2021-01-03 ENCOUNTER — Ambulatory Visit: Payer: Medicaid Other

## 2021-01-03 VITALS — BP 140/90 | HR 94 | Ht 72.0 in | Wt 283.4 lb

## 2021-01-03 DIAGNOSIS — E1140 Type 2 diabetes mellitus with diabetic neuropathy, unspecified: Secondary | ICD-10-CM | POA: Diagnosis not present

## 2021-01-03 DIAGNOSIS — E782 Mixed hyperlipidemia: Secondary | ICD-10-CM

## 2021-01-03 DIAGNOSIS — R002 Palpitations: Secondary | ICD-10-CM | POA: Diagnosis not present

## 2021-01-03 DIAGNOSIS — R072 Precordial pain: Secondary | ICD-10-CM

## 2021-01-03 DIAGNOSIS — I10 Essential (primary) hypertension: Secondary | ICD-10-CM

## 2021-01-03 MED ORDER — METOPROLOL TARTRATE 100 MG PO TABS: ORAL_TABLET | ORAL | 0 refills | Status: DC

## 2021-01-03 NOTE — Progress Notes (Signed)
Cardiology Office Note   Date:  01/03/2021   ID:  [name], DOB [birth year], MRN [id number]  PCP:  [name], DO    No chief complaint on file.  Chest pain  Wt Readings from Last 3 Encounters:  01/03/21 283 lb 6.4 oz (128.5 kg)  12/19/20 282 lb (127.9 kg)  11/21/20 280 lb (127 kg)       History of Present Illness: [name] is a 50 y.o. female who is being seen today for the evaluation of chest pain at the request of Andrena Mews T, MD.   Chest pains have been going on for a year.  Recently, she can feel her heart racing even while she is a rest. Mother has thoracic aortic aneurysm.   Normal ABIs in 2020.   DM for 20 years.   CP more often at night. Can last up to 15 minutes.  She has chronic pain in both arms and legs, and is very sedentary.  CP not related to walking.  No associated nausea, sweating with the chest pain.    Palpitations occurs several times a week.  Episodes are 5 minutes or less.   No early family h/o CAD.  Past Medical History:  Diagnosis Date   DEPRESSION/ANXIETY 08/08/2009   Diabetes mellitus without complication (HCC)    IBS 02/02/2010   Neuropathy    Polyneuropathy 12/04/2010   Renal stones    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) 08/08/2009    Past Surgical History:  Procedure Laterality Date   ABDOMINOPLASTY     CHOLECYSTECTOMY       Current Outpatient Medications  Medication Sig Dispense Refill   Accu-Chek Softclix Lancets lancets USE TO CHECK BLOOD SUGAR EVERY MORNING, AT NOON, AND EVERY NIGHT AT BEDTIME 100 each 12   aspirin 81 MG tablet Take 81 mg by mouth daily.     atorvastatin (LIPITOR) 80 MG tablet TAKE 1 TABLET(80 MG) BY MOUTH DAILY 90 tablet 3   BD PEN NEEDLE NANO 2ND GEN 32G X 4 MM MISC USE TO INJECT INSULIN DAILY 100 each 11   Blood Glucose Monitoring Suppl (ACCU-CHEK GUIDE) w/Device KIT 1 Device by Does not apply  route daily. 1 kit 1   clonazePAM (KLONOPIN) 0.5 MG tablet TAKE 3 TABLETS BY MOUTH AT BEDTIME AS NEEDED 90 tablet 3   clotrimazole-betamethasone (LOTRISONE) cream Apply topically 2 (two) times daily.     enalapril (VASOTEC) 10 MG tablet TAKE 1 TABLET(10 MG) BY MOUTH DAILY 90 tablet 2   fenofibrate (TRICOR) 145 MG tablet TAKE 1 TABLET(145 MG) BY MOUTH DAILY 90 tablet 2   glipiZIDE (GLUCOTROL) 10 MG tablet TAKE 1 TABLET(10 MG) BY MOUTH THREE TIMES DAILY AFTER MEALS 180 tablet 2   glucose blood (ACCU-CHEK GUIDE) test strip USE TO CHECK BLOOD SUGAR EVERY MORNING, AT NOON, AND EVERY NIGHT AT BEDTIME 100 strip 11   Lancets Misc. (ACCU-CHEK SOFTCLIX LANCET DEV) KIT 1 applicator by Does not apply route in the morning, at noon, and at bedtime. 1 kit 1   metFORMIN (GLUCOPHAGE) 1000 MG tablet TAKE 1 TABLET(1000 MG) BY MOUTH TWICE DAILY WITH A MEAL 180 tablet 2   morphine (MS CONTIN) 30 MG 12 hr tablet Take 1 tablet (30 mg total) by mouth every 12 (twelve) hours. 60 tablet 0   morphine (MSIR) 30 MG tablet One tablet at HS, may take a tablet during the day. 60 tablet 0   Multiple  Vitamin (MULTIVITAMIN) tablet Take 1 tablet by mouth daily.     nortriptyline (PAMELOR) 50 MG capsule TAKE 1 CAPSULE(50 MG) BY MOUTH AT BEDTIME 90 capsule 1   PARoxetine (PAXIL) 40 MG tablet TAKE 1 TABLET(40 MG) BY MOUTH DAILY 90 tablet 2   tiZANidine (ZANAFLEX) 2 MG tablet TAKE 1 TABLET BY MOUTH THREE TIMES DAILY 270 tablet 1   VICTOZA 18 MG/3ML SOPN ADMINISTER 1.8 MG UNDER THE SKIN DAILY 9 mL 0   VITAMIN D, CHOLECALCIFEROL, PO Take 2,400 Units by mouth daily.     No current facility-administered medications for this visit.    Allergies:   Vancomycin    Social History:  The patient  reports that she has never smoked. She has never used smokeless tobacco. She reports that she does not drink alcohol and does not use drugs.   Family History:  The patient's family history includes Aortic aneurysm in her mother; Colon cancer in her  maternal grandmother; Dementia in her paternal grandmother; Fibromyalgia in her mother; Heart Problems in her paternal grandmother; Heart attack in her paternal grandfather; Stroke in her maternal grandmother.    ROS:  Please see the history of present illness.   Otherwise, review of systems are positive for chronic pain.   All other systems are reviewed and negative.    PHYSICAL EXAM: VS:  BP 140/90   Pulse 94   Ht 6' (1.829 m)   Wt 283 lb 6.4 oz (128.5 kg)   SpO2 92%   BMI 38.44 kg/m  , BMI Body mass index is 38.44 kg/m. GEN: Well nourished, well developed, in no acute distress HEENT: normal Neck: no JVD, carotid bruits, or masses Cardiac: RRR; no murmurs, rubs, or gallops,no edema  Respiratory:  clear to auscultation bilaterally, normal work of breathing GI: soft, nontender, nondistended, + BS, obese MS: no deformity or atrophy Skin: warm and dry, no rash Neuro:  Strength and sensation are intact Psych: euthymic mood, full affect   EKG:   The ekg ordered today demonstrates NSR, no ST changes   Recent Labs: 07/04/2020: Hemoglobin 14.2; Platelets 286 08/02/2020: ALT 33; BUN 20; Creatinine, Ser 0.80; Potassium 4.2; Sodium 141   Lipid Panel    Component Value Date/Time   CHOL 134 08/02/2020 1629   TRIG 473 (H) 08/02/2020 1629   HDL 35 (L) 08/02/2020 1629   CHOLHDL 3.8 08/02/2020 1629   CHOLHDL 4.6 07/18/2015 1630   VLDL NOT CALC 07/18/2015 1630   LDLCALC 33 08/02/2020 1629   LDLDIRECT 35 06/14/2011 1555     Other studies Reviewed: Additional studies/ records that were reviewed today with results demonstrating: TG 473, LDL 33, HDL 35 in May 2022.   ASSESSMENT AND PLAN:  Chest pain: Plan for CTA coronaries.  Will also see aorta size as her mother has thoracic aortic aneurysm. Palpitations: 2 week Zio patch.  No syncope.  I think the result will be low risk. DM: A1C 7.8.  Has lower extremity neuropathy.  Healthy diet.  Regular exercise as tolerated with her chronic  pain will be beneficial as well. Hyperlipidemia: Continue atorvastatin and fenofibrate. Hypertension: Continue enalapril.  Should improve with increased activity also.   Current medicines are reviewed at length with the patient today.  The patient concerns regarding her medicines were addressed.  The following changes have been made:  No change  Labs/ tests ordered today include:  No orders of the defined types were placed in this encounter.   Recommend 150 minutes/week of aerobic exercise Low  fat, low carb, high fiber diet recommended  Disposition:   FU in based on test results   Signed, Larae Grooms, MD  01/03/2021 4:01 PM    [facility] [place], [place], [place]  [address] Phone: [phone]; Fax: [phone]

## 2021-01-03 NOTE — Progress Notes (Unsigned)
Patient enrolled for [employer] to mail a 14 day ZIO XT monitor to her address on file.

## 2021-01-03 NOTE — Patient Instructions (Addendum)
Medication Instructions:  Your physician recommends that you continue on your current medications as directed. Please refer to the Current Medication list given to you today.  *If you need a refill on your cardiac medications before your next appointment, please call your pharmacy*   Lab Work: Lab work to be done today--BMP If you have labs (blood work) drawn today and your tests are completely normal, you will receive your results only by: [place] (if you have MyChart) OR A paper copy in the mail If you have any lab test that is abnormal or we need to change your treatment, we will call you to review the results.   Testing/Procedures: Your physician has requested that you have cardiac CT. Cardiac computed tomography (CT) is a painless test that uses an x-ray machine to take clear, detailed pictures of your heart. For further information please visit [email or web address]. Please follow instruction sheet as given.  Your physician has recommended that you wear a holter monitor. Holter monitors are medical devices that record the heart's electrical activity. Doctors most often use these monitors to diagnose arrhythmias. Arrhythmias are problems with the speed or rhythm of the heartbeat. The monitor is a small, portable device. You can wear one while you do your normal daily activities. This is usually used to diagnose what is causing palpitations/syncope (passing out). 2 week zio   Follow-Up: At [facility], you and your health needs are our priority.  As part of our continuing mission to provide you with exceptional heart care, we have created designated Provider Care Teams.  These Care Teams include your primary Cardiologist (physician) and Advanced Practice Providers (APPs -  Physician Assistants and Nurse Practitioners) who all work together to provide you with the care you need, when you need it.  We recommend signing up for the patient portal called "MyChart".  Sign up  information is provided on this After Visit Summary.  MyChart is used to connect with patients for Virtual Visits (Telemedicine).  Patients are able to view lab/test results, encounter notes, upcoming appointments, etc.  Non-urgent messages can be sent to your provider as well.   To learn more about what you can do with MyChart, go to [email or web address].    Your next appointment:   Based on results  The format for your next appointment:   In Person  Provider:   You may see Larae Grooms, MD or one of the following Advanced Practice Providers on your designated Care Team:   Melina Copa, PA-C Ermalinda Barrios, PA-C   Other Instructions  Bryn Gulling- Long Term Monitor Instructions  Your physician has requested you wear a ZIO patch monitor for 14 days.  This is a single patch monitor. [employer] supplies one patch monitor per enrollment. Additional stickers are not available. Please do not apply patch if you will be having a Nuclear Stress Test,  Echocardiogram, Cardiac CT, MRI, or Chest Xray during the period you would be wearing the  monitor. The patch cannot be worn during these tests. You cannot remove and re-apply the  ZIO XT patch monitor.  Your ZIO patch monitor will be mailed 3 day USPS to your address on file. It may take 3-5 days  to receive your monitor after you have been enrolled.  Once you have received your monitor, please review the enclosed instructions. Your monitor  has already been registered assigning a specific monitor serial # to you.  Billing and Patient Assistance Program Information  We have supplied [employer] with any of your  insurance information on file for billing purposes. [employer] offers a sliding scale Patient Assistance Program for patients that do not have  insurance, or whose insurance does not completely cover the cost of the ZIO monitor.  You must apply for the Patient Assistance Program to qualify for this discounted rate.  To apply, please call  [employer] at [phone], select option 4, select option 2, ask to apply for  Patient Assistance Program. Theodore Demark will ask your household income, and how many people  are in your household. They will quote your out-of-pocket cost based on that information.  [employer] will also be able to set up a 110-month, interest-free payment plan if needed.  Applying the monitor   Shave hair from upper left chest.  Hold abrader disc by orange tab. Rub abrader in 40 strokes over the upper left chest as  indicated in your monitor instructions.  Clean area with 4 enclosed alcohol pads. Let dry.  Apply patch as indicated in monitor instructions. Patch will be placed under collarbone on left  side of chest with arrow pointing upward.  Rub patch adhesive wings for 2 minutes. Remove white label marked "1". Remove the white  label marked "2". Rub patch adhesive wings for 2 additional minutes.  While looking in a mirror, press and release button in center of patch. A small green light will  flash 3-4 times. This will be your only indicator that the monitor has been turned on.  Do not shower for the first 24 hours. You may shower after the first 24 hours.  Press the button if you feel a symptom. You will hear a small click. Record Date, Time and  Symptom in the Patient Logbook.  When you are ready to remove the patch, follow instructions on the last 2 pages of Patient  Logbook. Stick patch monitor onto the last page of Patient Logbook.  Place Patient Logbook in the blue and white box. Use locking tab on box and tape box closed  securely. The blue and white box has prepaid postage on it. Please place it in the mailbox as  soon as possible. Your physician should have your test results approximately 7 days after the  monitor has been mailed back to [facility].  Call [place] [facility] at [phone] if you have questions regarding  your ZIO XT patch monitor. Call them immediately if you see an orange  light blinking on your  monitor.  If your monitor falls off in less than 4 days, contact our Monitor department at [phone].  If your monitor becomes loose or falls off after 4 days call [employer] at [phone] for  suggestions on securing your monitor     Your cardiac CT will be scheduled at one of the below locations:   [facility] [address] [place], [place] [address] [phone]  [place] [address] [place], [place] [address] [phone]  If scheduled at [facility], please arrive at the [facility] main entrance (entrance A) of [facility] 30 minutes prior to test start time. Proceed to the [facility] Radiology Department (first floor) to check-in and test prep.  If scheduled at [facility], please arrive 15 mins early for check-in and test prep.  Please follow these instructions carefully (unless otherwise directed):   On the Night Before the Test: Be sure to Drink plenty of water. Do not consume any caffeinated/decaffeinated beverages or chocolate 12 hours prior  to your test. Do not take any antihistamines 12 hours prior to your test.   On the Day of the Test: Drink plenty of water until 1 hour prior to the test. Do not eat any food 4 hours prior to the test. You may take your regular medications prior to the test.  Take metoprolol (Lopressor) two hours prior to test. HOLD Furosemide/Hydrochlorothiazide morning of the test. FEMALES- please wear underwire-free bra if available, avoid dresses & tight clothing       After the Test: Drink plenty of water. After receiving IV contrast, you may experience a mild flushed feeling. This is normal. On occasion, you may experience a mild rash up to 24 hours after the test. This is not dangerous. If this occurs, you can take Benadryl 25 mg and increase your fluid intake. If you experience trouble  breathing, this can be serious. If it is severe call 911 IMMEDIATELY. If it is mild, please call our office. If you take any of these medications: Glipizide/Metformin, Avandament, Glucavance, please do not take 48 hours after completing test unless otherwise instructed.  Please allow 2-4 weeks for scheduling of routine cardiac CTs. Some insurance companies require a pre-authorization which may delay scheduling of this test.   For non-scheduling related questions, please contact the cardiac imaging nurse navigator should you have any questions/concerns: Marchia Bond, Cardiac Imaging Nurse Navigator Gordy Clement, Cardiac Imaging Nurse Navigator [facility] Direct Office Dial: [phone]   For scheduling needs, including cancellations and rescheduling, please call [place], [phone].

## 2021-01-04 LAB — BASIC METABOLIC PANEL
BUN/Creatinine Ratio: 29 — ABNORMAL HIGH (ref 9–23)
BUN: 18 mg/dL (ref 6–24)
CO2: 19 mmol/L — ABNORMAL LOW (ref 20–29)
Calcium: 10.1 mg/dL (ref 8.7–10.2)
Chloride: 97 mmol/L (ref 96–106)
Creatinine, Ser: 0.62 mg/dL (ref 0.57–1.00)
Glucose: 179 mg/dL — ABNORMAL HIGH (ref 70–99)
Potassium: 4.3 mmol/L (ref 3.5–5.2)
Sodium: 140 mmol/L (ref 134–144)
eGFR: 107 mL/min/{1.73_m2} (ref >59)

## 2021-01-10 ENCOUNTER — Ambulatory Visit (HOSPITAL_BASED_OUTPATIENT_CLINIC_OR_DEPARTMENT_OTHER): Payer: Medicaid Other | Admitting: Internal Medicine

## 2021-01-11 ENCOUNTER — Other Ambulatory Visit: Payer: Self-pay | Admitting: Family Medicine

## 2021-01-11 ENCOUNTER — Other Ambulatory Visit: Payer: Self-pay | Admitting: Interventional Cardiology

## 2021-01-13 ENCOUNTER — Ambulatory Visit: Payer: Medicaid Other | Admitting: Student

## 2021-01-18 ENCOUNTER — Other Ambulatory Visit (HOSPITAL_COMMUNITY): Payer: Self-pay | Admitting: *Deleted

## 2021-01-18 ENCOUNTER — Encounter: Payer: Medicaid Other | Attending: Physical Medicine & Rehabilitation | Admitting: Registered Nurse

## 2021-01-18 ENCOUNTER — Telehealth (HOSPITAL_COMMUNITY): Payer: Self-pay | Admitting: *Deleted

## 2021-01-18 ENCOUNTER — Other Ambulatory Visit: Payer: Self-pay

## 2021-01-18 VITALS — BP 118/78 | HR 101 | Ht 72.0 in | Wt 283.6 lb

## 2021-01-18 DIAGNOSIS — E1140 Type 2 diabetes mellitus with diabetic neuropathy, unspecified: Secondary | ICD-10-CM | POA: Diagnosis not present

## 2021-01-18 DIAGNOSIS — M792 Neuralgia and neuritis, unspecified: Secondary | ICD-10-CM | POA: Insufficient documentation

## 2021-01-18 DIAGNOSIS — G894 Chronic pain syndrome: Secondary | ICD-10-CM | POA: Diagnosis not present

## 2021-01-18 DIAGNOSIS — Z5181 Encounter for therapeutic drug level monitoring: Secondary | ICD-10-CM | POA: Insufficient documentation

## 2021-01-18 DIAGNOSIS — Z79891 Long term (current) use of opiate analgesic: Secondary | ICD-10-CM | POA: Diagnosis not present

## 2021-01-18 DIAGNOSIS — F341 Dysthymic disorder: Secondary | ICD-10-CM | POA: Diagnosis not present

## 2021-01-18 DIAGNOSIS — R079 Chest pain, unspecified: Secondary | ICD-10-CM

## 2021-01-18 MED ORDER — MORPHINE SULFATE 30 MG PO TABS: ORAL_TABLET | ORAL | 0 refills | Status: DC

## 2021-01-18 MED ORDER — TIZANIDINE HCL 2 MG PO TABS: 2.0000 mg | ORAL_TABLET | Freq: Three times a day (TID) | ORAL | 1 refills | Status: DC

## 2021-01-18 MED ORDER — MORPHINE SULFATE ER 30 MG PO TBCR: 30.0000 mg | EXTENDED_RELEASE_TABLET | Freq: Two times a day (BID) | ORAL | 0 refills | Status: DC

## 2021-01-18 NOTE — Progress Notes (Signed)
Subjective:    Patient ID: [name], female    DOB: April 20, 1970, 50 y.o.   MRN: [id number]  HPI: BRIANNON Guerra is a 50 y.o. female who returns for follow up appointment for chronic pain and medication refill. She states her pain is located in her bilateral hands and bilateral feet with tingling and burning. She rates her pain 8. His current exercise regime is walking and performing stretching exercises.  [name] Morphine equivalent is 120.00 MME.  She is also prescribed Clonazepam. We have discussed the black box warning of using opioids and benzodiazepines. I highlighted the dangers of using these drugs together and discussed the adverse events including respiratory suppression, overdose, cognitive impairment and importance of compliance with current regimen. We will continue to monitor and adjust as indicated.      Last UDS was Performed on 11/21/2020, it was consistent.     Pain Inventory Average Pain 7 Pain Right Now 8 My pain is sharp, burning, dull, stabbing, tingling, and aching  In the last 24 hours, has pain interfered with the following? General activity 8 Relation with others 8 Enjoyment of life 8 What TIME of day is your pain at its worst? morning , daytime, evening, and night Sleep (in general) Poor  Pain is worse with: walking, bending, sitting, inactivity, standing, and some activites Pain improves with: medication and massage Relief from Meds: 8  Family History  Problem Relation Age of Onset   Fibromyalgia Mother    Aortic aneurysm Mother    Stroke Maternal Grandmother    Colon cancer Maternal Grandmother    Heart Problems Paternal Grandmother    Dementia Paternal Grandmother    Heart attack Paternal Grandfather    Social History   Socioeconomic History   Marital status: Legally Separated    Spouse name: Not on file   Number of children: 1   Years of education: Not on file   Highest education level: Bachelor's degree (e.g., BA, AB,  BS)  Occupational History   Occupation: Disabled  Tobacco Use   Smoking status: Never   Smokeless tobacco: Never  Vaping Use   Vaping Use: Never used  Substance and Sexual Activity   Alcohol use: No    Alcohol/week: 0.0 standard drinks   Drug use: No    Comment: rx opiods   Sexual activity: Yes    Birth control/protection: Condom  Other Topics Concern   Not on file  Social History Narrative   Not on file   Social Determinants of Health   Financial Resource Strain: Not on file  Food Insecurity: Not on file  Transportation Needs: Not on file  Physical Activity: Not on file  Stress: Not on file  Social Connections: Not on file   Past Surgical History:  Procedure Laterality Date   ABDOMINOPLASTY     CHOLECYSTECTOMY     Past Surgical History:  Procedure Laterality Date   ABDOMINOPLASTY     CHOLECYSTECTOMY     Past Medical History:  Diagnosis Date   DEPRESSION/ANXIETY 08/08/2009   Diabetes mellitus without complication ([place])    IBS 02/02/2010   Neuropathy    Polyneuropathy 12/04/2010   Renal stones    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) 08/08/2009   BP 118/78   Pulse (!) 101   Ht 6' (1.829 m)   Wt 283 lb 9.6 oz (128.6 kg)   SpO2 99%   BMI 38.46 kg/m   Opioid Risk  Score:   Fall Risk Score:  `1  Depression screen PHQ 2/9  Depression screen [facility] 2/9 12/19/2020 10/18/2020 07/06/2020 06/08/2020 05/11/2020 01/15/2020 10/26/2019  Decreased Interest 0 1 1 1 1 2 3   Down, Depressed, Hopeless 0 1 1 1 1 2 3   PHQ - 2 Score 0 2 2 2 2 4 6   Altered sleeping - - - - - 3 -  Tired, decreased energy - - - - - 3 -  Change in appetite - - - - - 0 -  Feeling bad or failure about yourself  - - - - - 3 -  Trouble concentrating - - - - - 1 -  Moving slowly or fidgety/restless - - - - - 0 -  Suicidal thoughts - - - - - 0 -  PHQ-9 Score - - - - - 14 -  Difficult doing work/chores - - - - - - -  Some recent  data might be hidden     Review of Systems  Musculoskeletal:  Positive for back pain and gait problem.       Bilateral feet, leg and hand pain  All other systems reviewed and are negative.     Objective:   Physical Exam Vitals and nursing note reviewed.  Constitutional:      Appearance: Normal appearance.  Cardiovascular:     Rate and Rhythm: Normal rate and regular rhythm.     Pulses: Normal pulses.     Heart sounds: Normal heart sounds.  Pulmonary:     Effort: Pulmonary effort is normal.     Breath sounds: Normal breath sounds.  Musculoskeletal:     Cervical back: Normal range of motion and neck supple.     Comments: Normal Muscle Bulk and Muscle Testing Reveals:  Upper Extremities: Full ROM and Muscle Strength 5/5  Lower Extremities: Full ROM and Muscle Strength 5/5 Arises from Table slowly using cane for support Narrow based  Gait     Skin:    General: Skin is warm and dry.  Neurological:     Mental Status: She is alert and oriented to person, place, and time.  Psychiatric:        Mood and Affect: Mood normal.        Behavior: Behavior normal.         Assessment & Plan:  1.Severe peripheral polyneuropathy of undetermined etiology.: 03/20/2021 Refilled: MS Contin 30 mg one tablet every 12 hours #60 and  MSIR 30 mg one tablet at HS, may take a tablet during the day as needed #60.Second script sen to accommodate scheduled appointment. We will continue the opioid monitoring program, this consists of regular clinic visits, examinations, urine drug screen, pill counts as well as use of [place] Controlled Substance Reporting system. A 12 month History has been reviewed on the [place] Controlled Substance Reporting System on 03/20/2021 2. Bilateral progressive upper extremity hand pain/ Polyneuropathy. Continue with current medication regime. Tight Glucose Control. Adhere to healthy diet regime. Continue with current medication regimen with nortriptyline:  03/20/2021 3. Muscle Spasms: Continue current medication regimen with  Zanaflex. Continue to Monitor.  03/20/2021 4. Depression/Anxiety: Continue current medication regimen with Paxil and  Klonopin. Continue to Monitor. 03/20/2021  5. Bilateral Chronic Knee Pain: No complaints Today.Continue HEP as Tolerated and Continue to Monitor. 03/20/2021. .    F/U in 1 month

## 2021-01-18 NOTE — Telephone Encounter (Signed)
Attempted to call patient regarding upcoming cardiac CT appointment. °Left message on voicemail with name and callback number ° °Von Quintanar RN Navigator Cardiac Imaging °[facility] °[phone] Office °[phone] Cell ° °

## 2021-01-19 ENCOUNTER — Other Ambulatory Visit: Payer: Self-pay | Admitting: Student

## 2021-01-19 ENCOUNTER — Encounter: Payer: Self-pay | Admitting: Registered Nurse

## 2021-01-19 ENCOUNTER — Ambulatory Visit (HOSPITAL_COMMUNITY): Admission: RE | Admit: 2021-01-19 | Payer: Medicaid Other | Source: Ambulatory Visit

## 2021-01-19 DIAGNOSIS — E119 Type 2 diabetes mellitus without complications: Secondary | ICD-10-CM

## 2021-01-20 ENCOUNTER — Ambulatory Visit (HOSPITAL_BASED_OUTPATIENT_CLINIC_OR_DEPARTMENT_OTHER): Payer: Medicaid Other | Admitting: Internal Medicine

## 2021-01-23 DIAGNOSIS — M26623 Arthralgia of bilateral temporomandibular joint: Secondary | ICD-10-CM | POA: Diagnosis not present

## 2021-01-23 DIAGNOSIS — H9313 Tinnitus, bilateral: Secondary | ICD-10-CM | POA: Diagnosis not present

## 2021-01-23 DIAGNOSIS — H903 Sensorineural hearing loss, bilateral: Secondary | ICD-10-CM | POA: Diagnosis not present

## 2021-01-23 DIAGNOSIS — H93A2 Pulsatile tinnitus, left ear: Secondary | ICD-10-CM | POA: Diagnosis not present

## 2021-01-31 ENCOUNTER — Telehealth: Payer: Self-pay | Admitting: Student

## 2021-01-31 ENCOUNTER — Telehealth (HOSPITAL_COMMUNITY): Payer: Self-pay | Admitting: Emergency Medicine

## 2021-01-31 ENCOUNTER — Ambulatory Visit: Payer: Medicaid Other | Admitting: Obstetrics

## 2021-01-31 NOTE — Telephone Encounter (Signed)
Attempted to call patient regarding upcoming cardiac CT appointment. °Left message on voicemail with name and callback number °Dhaval Woo RN Navigator Cardiac Imaging °[facility] °[phone] Office °[phone] Cell ° °

## 2021-01-31 NOTE — Telephone Encounter (Signed)
..   Medicaid Managed Care   Unsuccessful Outreach Note  01/31/2021 Name: [name] MRN: [id number] DOB: [birth year]  Referred by: [name], DO Reason for referral : High Risk Managed Medicaid (I called the patient today to offer her the services of the [facility] Team. Patient did not answer and I left a message on her VM.)   Third unsuccessful telephone outreach was attempted today. The patient was referred to the case management team for assistance with care management and care coordination. The patient's primary care provider has been notified of our unsuccessful attempts to make or maintain contact with the patient. The care management team is pleased to engage with this patient at any time in the future should he/she be interested in assistance from the care management team.   Follow Up Plan: We have been unable to make contact with the patient for follow up. The care management team is available to follow up with the patient after provider conversation with the patient regarding recommendation for care management engagement and subsequent re-referral to the care management team.   [place], [place]

## 2021-02-01 ENCOUNTER — Other Ambulatory Visit: Payer: Self-pay

## 2021-02-01 ENCOUNTER — Ambulatory Visit (HOSPITAL_COMMUNITY)
Admission: RE | Admit: 2021-02-01 | Discharge: 2021-02-01 | Disposition: A | Discharge status: Home / Self Care | Payer: Medicaid Other | Source: Ambulatory Visit | Attending: Interventional Cardiology | Admitting: Interventional Cardiology

## 2021-02-01 ENCOUNTER — Ambulatory Visit (HOSPITAL_COMMUNITY): Payer: Medicaid Other

## 2021-02-01 ENCOUNTER — Ambulatory Visit (HOSPITAL_COMMUNITY): Admission: RE | Admit: 2021-02-01 | Payer: Medicaid Other | Source: Ambulatory Visit

## 2021-02-01 ENCOUNTER — Encounter (HOSPITAL_COMMUNITY): Payer: Self-pay

## 2021-02-01 DIAGNOSIS — R079 Chest pain, unspecified: Secondary | ICD-10-CM

## 2021-02-01 DIAGNOSIS — R072 Precordial pain: Secondary | ICD-10-CM

## 2021-02-01 NOTE — Progress Notes (Signed)
Patient arrived to radiology for cardiac CT with blood pressure 137/70 and heart rate 113. Patient tool Metoprolol 100 mg prior to arrival. Dr Johnsie Cancel notified who spoke with patient and ordered an EKG. EKG given to Dr Johnsie Cancel who will contact Dr Irish Lack to discuss a new plan. Cancel  Cardiac CT at this time.

## 2021-02-02 ENCOUNTER — Telehealth: Payer: Self-pay | Admitting: *Deleted

## 2021-02-02 NOTE — Telephone Encounter (Signed)
Left message to call office

## 2021-02-02 NOTE — Telephone Encounter (Signed)
-----   Message from Jettie Booze, MD sent at 02/02/2021 12:59 PM EST ----- Please order lexiscan myoview and echo for chest pain.  ----- Message ----- From: Josue Hector, MD Sent: 02/01/2021   4:22 PM EST To: Jettie Booze, MD  Unable to do cardiac CTA despite beta blocker HR 114 ECG shows sinus tachycardia Suggest lexiscan myovue

## 2021-02-06 ENCOUNTER — Ambulatory Visit: Payer: Medicaid Other | Admitting: Student

## 2021-02-07 NOTE — Telephone Encounter (Signed)
Left message to call office

## 2021-02-08 ENCOUNTER — Ambulatory Visit (HOSPITAL_BASED_OUTPATIENT_CLINIC_OR_DEPARTMENT_OTHER): Payer: Medicaid Other | Attending: Internal Medicine | Admitting: Internal Medicine

## 2021-02-08 ENCOUNTER — Other Ambulatory Visit: Payer: Self-pay

## 2021-02-08 VITALS — Ht 72.0 in | Wt 280.0 lb

## 2021-02-08 DIAGNOSIS — R0683 Snoring: Secondary | ICD-10-CM | POA: Diagnosis not present

## 2021-02-08 DIAGNOSIS — G4733 Obstructive sleep apnea (adult) (pediatric): Secondary | ICD-10-CM | POA: Insufficient documentation

## 2021-02-09 ENCOUNTER — Ambulatory Visit: Payer: Medicaid Other | Admitting: Registered Nurse

## 2021-02-11 ENCOUNTER — Other Ambulatory Visit: Payer: Self-pay | Admitting: Student

## 2021-02-11 DIAGNOSIS — E119 Type 2 diabetes mellitus without complications: Secondary | ICD-10-CM

## 2021-02-24 ENCOUNTER — Telehealth: Payer: Self-pay | Admitting: *Deleted

## 2021-02-24 NOTE — Telephone Encounter (Signed)
Left message to call office

## 2021-02-24 NOTE — Telephone Encounter (Signed)
Prior auth for Morphine Sulfate ER 30 mg #60 submitted to insurance via CoverMyMeds.

## 2021-02-25 DIAGNOSIS — R0683 Snoring: Secondary | ICD-10-CM

## 2021-02-25 NOTE — Procedures (Signed)
    Patient Name: [name], [name] Date: 02/13/2021 Gender: Female D.O.B: March 05, 1971 Age (years): 50 Referring Provider: Andrena Mews Height (inches): 72 Interpreting Physician: Baird Lyons MD, ABSM Weight (lbs): 282 RPSGT: Gerhard Perches BMI: 38 MRN: [id number] Neck Size: <br>  CLINICAL INFORMATION Sleep Study Type: HST Indication for sleep study: snoring Epworth Sleepiness Score: 9  SLEEP STUDY TECHNIQUE A multi-channel overnight portable sleep study was performed. The channels recorded were: nasal airflow, thoracic respiratory movement, and oxygen saturation with a pulse oximetry. Snoring was also monitored.  MEDICATIONS Patient self administered medications include: none reported.  SLEEP ARCHITECTURE Patient was studied for 365 minutes. The sleep efficiency was 99.7 % and the patient was supine for 68.9%. The arousal index was 0.0 per hour.  RESPIRATORY PARAMETERS The overall AHI was 17.9 per hour, with a central apnea index of 0 per hour. The oxygen nadir was 82% during sleep.  CARDIAC DATA Mean heart rate during sleep was 84.6 bpm.  IMPRESSIONS - Moderate obstructive sleep apnea occurred during this study (AHI = 17.9/h). - Moderate oxygen desaturation was noted during this study (Min O2 = 82%). Mean O2 saturation 94%. - Patient snored.  DIAGNOSIS - Obstructive Sleep Apnea (G47.33)  RECOMMENDATIONS - Suggest CPAP titration sleep study or autopap. Other options, including a fitted oral appliance or ENT evaluation, would be based on clinical judgment. - Be careful with alcohol, sedatives and other CNS depressants that may worsen sleep apnea and disrupt normal sleep architecture. - Sleep hygiene should be reviewed to assess factors that may improve sleep quality. - Weight management and regular exercise should be initiated or continued.  [Electronically signed] 02/25/2021 12:52 PM  Baird Lyons MD, [place], American Board of Sleep  Medicine   NPI: [id number]                         [employer], [place] [employer]  ELECTRONICALLY SIGNED ON:  02/25/2021, 12:48 PM [place] PH: [phone]   FX: [phone] [place]

## 2021-02-28 ENCOUNTER — Encounter: Payer: Self-pay | Admitting: *Deleted

## 2021-02-28 NOTE — Telephone Encounter (Signed)
Approved through 05/25/2021. Faxed to pharmacy and Ms Arrowood notified.

## 2021-03-01 ENCOUNTER — Ambulatory Visit: Payer: Medicaid Other | Admitting: Obstetrics

## 2021-03-02 ENCOUNTER — Encounter: Payer: Self-pay | Admitting: *Deleted

## 2021-03-02 NOTE — Telephone Encounter (Signed)
Letter sent to patient asking her to contact office.

## 2021-03-11 ENCOUNTER — Other Ambulatory Visit: Payer: Self-pay | Admitting: Student

## 2021-03-11 DIAGNOSIS — E119 Type 2 diabetes mellitus without complications: Secondary | ICD-10-CM

## 2021-03-22 ENCOUNTER — Other Ambulatory Visit: Payer: Self-pay

## 2021-03-22 ENCOUNTER — Encounter: Payer: Medicaid Other | Attending: Physical Medicine & Rehabilitation | Admitting: Registered Nurse

## 2021-03-22 ENCOUNTER — Encounter: Payer: Self-pay | Admitting: Registered Nurse

## 2021-03-22 VITALS — BP 131/72 | HR 101 | Temp 98.2°F | Ht 72.0 in | Wt 280.0 lb

## 2021-03-22 DIAGNOSIS — Z79891 Long term (current) use of opiate analgesic: Secondary | ICD-10-CM | POA: Insufficient documentation

## 2021-03-22 DIAGNOSIS — Z5181 Encounter for therapeutic drug level monitoring: Secondary | ICD-10-CM | POA: Diagnosis not present

## 2021-03-22 DIAGNOSIS — F341 Dysthymic disorder: Secondary | ICD-10-CM | POA: Diagnosis not present

## 2021-03-22 DIAGNOSIS — G894 Chronic pain syndrome: Secondary | ICD-10-CM | POA: Insufficient documentation

## 2021-03-22 MED ORDER — MORPHINE SULFATE ER 30 MG PO TBCR: 30.0000 mg | EXTENDED_RELEASE_TABLET | Freq: Two times a day (BID) | ORAL | 0 refills | Status: DC

## 2021-03-22 MED ORDER — MORPHINE SULFATE 30 MG PO TABS: ORAL_TABLET | ORAL | 0 refills | Status: DC

## 2021-03-22 MED ORDER — CLONAZEPAM 0.5 MG PO TABS: ORAL_TABLET | ORAL | 3 refills | Status: DC

## 2021-03-22 NOTE — Progress Notes (Addendum)
Subjective:    Patient ID: [name], female    DOB: Jun 26, 1970, 50 y.o.   MRN: [id number]  HPI: [name] is a 50 y.o. female who returns for follow up appointment for chronic pain and medication refill. She states her pain is located in her bilateral hands and bilateral feet with tingling and burning. She rates her pain 7.Her current exercise regime is walking, she was encouraged to increase HEP as tolerated.   [name] Morphine equivalent is 120.00 MME. She is also prescribed Clonazepam. We have discussed the black box warning of using opioids and benzodiazepines. I highlighted the dangers of using these drugs together and discussed the adverse events including respiratory suppression, overdose, cognitive impairment and importance of compliance with current regimen. We will continue to monitor and adjust as indicated.   UDS ordered today.     Pain Inventory Average Pain 8 Pain Right Now 7 My pain is constant, sharp, burning, dull, and stabbing  In the last 24 hours, has pain interfered with the following? General activity 8 Relation with others 8 Enjoyment of life 8 What TIME of day is your pain at its worst? morning  Sleep (in general) Poor  Pain is worse with: walking, bending, and standing Pain improves with: rest and medication Relief from Meds: 8  Family History  Problem Relation Age of Onset   Fibromyalgia Mother    Aortic aneurysm Mother    Stroke Maternal Grandmother    Colon cancer Maternal Grandmother    Heart Problems Paternal Grandmother    Dementia Paternal Grandmother    Heart attack Paternal Grandfather    Social History   Socioeconomic History   Marital status: Legally Separated    Spouse name: Not on file   Number of children: 1   Years of education: Not on file   Highest education level: Bachelor's degree (e.g., BA, AB, BS)  Occupational History   Occupation: Disabled  Tobacco Use   Smoking status: Never   Smokeless  tobacco: Never  Vaping Use   Vaping Use: Never used  Substance and Sexual Activity   Alcohol use: No    Alcohol/week: 0.0 standard drinks   Drug use: No    Comment: rx opiods   Sexual activity: Yes    Birth control/protection: Condom  Other Topics Concern   Not on file  Social History Narrative   Not on file   Social Determinants of Health   Financial Resource Strain: Not on file  Food Insecurity: Not on file  Transportation Needs: Not on file  Physical Activity: Not on file  Stress: Not on file  Social Connections: Not on file   Past Surgical History:  Procedure Laterality Date   ABDOMINOPLASTY     CHOLECYSTECTOMY     Past Surgical History:  Procedure Laterality Date   ABDOMINOPLASTY     CHOLECYSTECTOMY     Past Medical History:  Diagnosis Date   DEPRESSION/ANXIETY 08/08/2009   Diabetes mellitus without complication ([place])    IBS 02/02/2010   Neuropathy    Polyneuropathy 12/04/2010   Renal stones    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) 08/08/2009   BP 131/72    Pulse (!) 101    Temp 98.2 F (36.8 C)    Ht 6' (1.829 m)    Wt 280 lb (127 kg)    SpO2 98%    BMI 37.97 kg/m   Opioid Risk Score:   Fall  Risk Score:  `1  Depression screen PHQ 2/9  Depression screen [facility] 2/9 12/19/2020 10/18/2020 07/06/2020 06/08/2020 05/11/2020 01/15/2020 10/26/2019  Decreased Interest 0 1 1 1 1 2 3   Down, Depressed, Hopeless 0 1 1 1 1 2 3   PHQ - 2 Score 0 2 2 2 2 4 6   Altered sleeping - - - - - 3 -  Tired, decreased energy - - - - - 3 -  Change in appetite - - - - - 0 -  Feeling bad or failure about yourself  - - - - - 3 -  Trouble concentrating - - - - - 1 -  Moving slowly or fidgety/restless - - - - - 0 -  Suicidal thoughts - - - - - 0 -  PHQ-9 Score - - - - - 14 -  Difficult doing work/chores - - - - - - -  Some recent data might be hidden     Review of Systems  Constitutional: Negative.   HENT:  Negative.    Eyes: Negative.   Respiratory: Negative.    Cardiovascular: Negative.   Gastrointestinal: Negative.   Endocrine: Negative.   Genitourinary: Negative.   Musculoskeletal:  Positive for gait problem.       Arms and legs  Skin: Negative.   Allergic/Immunologic: Negative.   Neurological:  Positive for numbness.       Neuropathy  Hematological: Negative.   Psychiatric/Behavioral: Negative.    All other systems reviewed and are negative.     Objective:   Physical Exam Vitals and nursing note reviewed.  Constitutional:      Appearance: Normal appearance.  Cardiovascular:     Rate and Rhythm: Normal rate and regular rhythm.     Pulses: Normal pulses.     Heart sounds: Normal heart sounds.  Pulmonary:     Effort: Pulmonary effort is normal.     Breath sounds: Normal breath sounds.  Musculoskeletal:     Cervical back: Normal range of motion and neck supple.     Comments: Normal Muscle Bulk and Muscle Testing Reveals:  Upper Extremities: Full ROM and Muscle Strength 5/5 Lower Extremities : Full ROM and Muscle Strength 5/5 Arises from Table with ease Narrow Based Gait     Skin:    General: Skin is warm and dry.  Neurological:     Mental Status: She is alert and oriented to person, place, and time.  Psychiatric:        Mood and Affect: Mood normal.        Behavior: Behavior normal.         Assessment & Plan:  1.Severe peripheral polyneuropathy of undetermined etiology.: 03/22/2021 Refilled: MS Contin 30 mg one tablet every 12 hours #60 and  MSIR 30 mg one tablet at HS, may take a tablet during the day as needed #60.Second script sen to accommodate scheduled appointment. We will continue the opioid monitoring program, this consists of regular clinic visits, examinations, urine drug screen, pill counts as well as use of [place] Controlled Substance Reporting system. A 12 month History has been reviewed on the [place] Controlled Substance Reporting  System on 03/22/2021 2. Bilateral progressive upper extremity hand pain/ Polyneuropathy. Continue with current medication regime. Tight Glucose Control. Adhere to healthy diet regime. Continue with current medication regimen with nortriptyline: 03/22/2021 3. Muscle Spasms: Continue current medication regimen with  Zanaflex. Continue to Monitor.  03/22/2021 4. Depression/Anxiety: Continue current medication regimen with Paxil and  Klonopin. Continue to  Monitor. 03/22/2021  5. Bilateral Chronic Knee Pain: No complaints Today.Continue HEP as Tolerated and Continue to Monitor. 03/22/2021. .    F/U in 1 month

## 2021-03-23 ENCOUNTER — Encounter: Payer: Self-pay | Admitting: Interventional Cardiology

## 2021-03-23 DIAGNOSIS — R072 Precordial pain: Secondary | ICD-10-CM

## 2021-03-27 LAB — TOXASSURE SELECT 13 (MW), URINE

## 2021-03-28 ENCOUNTER — Telehealth: Payer: Self-pay | Admitting: *Deleted

## 2021-03-28 NOTE — Telephone Encounter (Signed)
Urine drug screen for this encounter is consistent for prescribed medication 

## 2021-03-29 ENCOUNTER — Encounter: Payer: Self-pay | Admitting: *Deleted

## 2021-03-29 NOTE — Telephone Encounter (Signed)
Reviewed with monitor department and if patient would like to wear monitor she should be scheduled for appointment for monitor to be applied in office.  Does not need to return current monitor

## 2021-04-04 ENCOUNTER — Telehealth: Payer: Self-pay | Admitting: *Deleted

## 2021-04-04 NOTE — Telephone Encounter (Signed)
Patient called to schedule an appt for her physical.  She would also like to discuss sleep study results with pcp prior to February.  Will forward to MD.  Results are scanned in under media.  [name],CMA

## 2021-04-07 ENCOUNTER — Other Ambulatory Visit: Payer: Self-pay | Admitting: Student

## 2021-04-07 DIAGNOSIS — E119 Type 2 diabetes mellitus without complications: Secondary | ICD-10-CM

## 2021-04-18 ENCOUNTER — Ambulatory Visit: Payer: Medicaid Other | Admitting: Obstetrics & Gynecology

## 2021-04-19 ENCOUNTER — Other Ambulatory Visit (HOSPITAL_COMMUNITY): Payer: Medicaid Other

## 2021-04-19 ENCOUNTER — Encounter (HOSPITAL_COMMUNITY): Payer: Self-pay | Admitting: Interventional Cardiology

## 2021-04-20 ENCOUNTER — Telehealth (HOSPITAL_COMMUNITY): Payer: Self-pay | Admitting: *Deleted

## 2021-04-20 ENCOUNTER — Encounter: Payer: Medicaid Other | Admitting: Registered Nurse

## 2021-04-20 NOTE — Telephone Encounter (Signed)
Left message on voicemail per DPR in reference to upcoming appointment scheduled on  04/26/21 with detailed instructions given per Myocardial Perfusion Study Information Sheet for the test. LM to arrive 15 minutes early, and that it is imperative to arrive on time for appointment to keep from having the test rescheduled. If you need to cancel or reschedule your appointment, please call the office within 24 hours of your appointment. Failure to do so may result in a cancellation of your appointment, and a $50 no show fee. Phone number given for call back for any questions. Kirstie Peri

## 2021-04-21 ENCOUNTER — Encounter: Payer: Medicaid Other | Admitting: Registered Nurse

## 2021-04-21 ENCOUNTER — Telehealth (HOSPITAL_COMMUNITY): Payer: Self-pay | Admitting: Interventional Cardiology

## 2021-04-21 NOTE — Telephone Encounter (Signed)
Patient has cancelled Myoview and does not wish to have at this time. She will speak with Dr. Irish Lack about it before rescheduling. Order will be removed from the [facility] and when patient calls back to reschedule we will reinstate the order.

## 2021-04-24 ENCOUNTER — Telehealth: Payer: Self-pay | Admitting: Registered Nurse

## 2021-04-24 ENCOUNTER — Telehealth: Payer: Self-pay

## 2021-04-24 ENCOUNTER — Encounter: Payer: Medicaid Other | Attending: Physical Medicine & Rehabilitation | Admitting: Registered Nurse

## 2021-04-24 ENCOUNTER — Other Ambulatory Visit (HOSPITAL_COMMUNITY): Payer: Self-pay

## 2021-04-24 ENCOUNTER — Other Ambulatory Visit: Payer: Self-pay

## 2021-04-24 ENCOUNTER — Encounter: Payer: Self-pay | Admitting: Registered Nurse

## 2021-04-24 VITALS — BP 132/83 | HR 100 | Temp 98.0°F | Ht 72.0 in | Wt 280.0 lb

## 2021-04-24 DIAGNOSIS — Z5181 Encounter for therapeutic drug level monitoring: Secondary | ICD-10-CM | POA: Insufficient documentation

## 2021-04-24 DIAGNOSIS — F341 Dysthymic disorder: Secondary | ICD-10-CM | POA: Insufficient documentation

## 2021-04-24 DIAGNOSIS — G894 Chronic pain syndrome: Secondary | ICD-10-CM | POA: Insufficient documentation

## 2021-04-24 DIAGNOSIS — E1140 Type 2 diabetes mellitus with diabetic neuropathy, unspecified: Secondary | ICD-10-CM | POA: Insufficient documentation

## 2021-04-24 DIAGNOSIS — Z79891 Long term (current) use of opiate analgesic: Secondary | ICD-10-CM | POA: Insufficient documentation

## 2021-04-24 DIAGNOSIS — M62838 Other muscle spasm: Secondary | ICD-10-CM | POA: Diagnosis not present

## 2021-04-24 DIAGNOSIS — G629 Polyneuropathy, unspecified: Secondary | ICD-10-CM | POA: Insufficient documentation

## 2021-04-24 DIAGNOSIS — L539 Erythematous condition, unspecified: Secondary | ICD-10-CM | POA: Insufficient documentation

## 2021-04-24 MED ORDER — MORPHINE SULFATE ER 30 MG PO TBCR: 30.0000 mg | EXTENDED_RELEASE_TABLET | Freq: Two times a day (BID) | ORAL | 0 refills | Status: DC

## 2021-04-24 MED ORDER — MORPHINE SULFATE ER 30 MG PO TBCR
30.0000 mg | EXTENDED_RELEASE_TABLET | Freq: Two times a day (BID) | ORAL | 0 refills | Status: DC
Start: 2021-04-24 — End: 2021-04-26
  Filled 2021-04-24 – 2021-04-25 (×2): qty 14, 7d supply, fill #0

## 2021-04-24 MED ORDER — MORPHINE SULFATE 30 MG PO TABS: ORAL_TABLET | ORAL | 0 refills | Status: DC

## 2021-04-24 NOTE — Telephone Encounter (Signed)
PMP was Reviewed.  MS Contin e-scribed to [facility]. [name] is aware of the above.

## 2021-04-24 NOTE — Progress Notes (Signed)
Subjective:    Patient ID: [name], female    DOB: 27-Jul-1970, 51 y.o.   MRN: [id number]  HPI: [name] is a 51 y.o. female who returns for follow up appointment for chronic pain and medication refill. She states her  pain is located in  her bilateral hands and feet with numbness and tingling. She reports her pain has increased in intensity in her bilateral hands. She rates her pain 8. Her current exercise regime is walking short distances, she also states she will began riding her stationary bicycle.   [name] asked this provider to look at her bilateral feet, she states her mother noticed her bilateral feet were red. She states she hadn't really noticed. Bilateral feet are reddened, she is afebrile. She was encouraged to F/U with Urgent Care/ PCP and with her podiatrist. She states she will go to Urgent Care, she was instructed to send a My-Chart message with update. She verbalizes understanding.   [name]  Morphine equivalent is 120.00 MME.   Last UDS was Performed on 03/22/2021, it was consistent.    Pain Inventory Average Pain 7 Pain Right Now 8 My pain is constant, sharp, burning, dull, stabbing, tingling, and aching  In the last 24 hours, has pain interfered with the following? General activity 8 Relation with others 8 Enjoyment of life 8 What TIME of day is your pain at its worst? morning  Sleep (in general) Poor  Pain is worse with: walking, bending, standing, and some activites Pain improves with: rest, medication, and massage Relief from Meds: 7  Family History  Problem Relation Age of Onset   Fibromyalgia Mother    Aortic aneurysm Mother    Stroke Maternal Grandmother    Colon cancer Maternal Grandmother    Heart Problems Paternal Grandmother    Dementia Paternal Grandmother    Heart attack Paternal Grandfather    Social History   Socioeconomic History   Marital status: Legally Separated    Spouse name: Not on file   Number  of children: 1   Years of education: Not on file   Highest education level: Bachelor's degree (e.g., BA, AB, BS)  Occupational History   Occupation: Disabled  Tobacco Use   Smoking status: Never   Smokeless tobacco: Never  Vaping Use   Vaping Use: Never used  Substance and Sexual Activity   Alcohol use: No    Alcohol/week: 0.0 standard drinks   Drug use: No    Comment: rx opiods   Sexual activity: Yes    Birth control/protection: Condom  Other Topics Concern   Not on file  Social History Narrative   Not on file   Social Determinants of Health   Financial Resource Strain: Not on file  Food Insecurity: Not on file  Transportation Needs: Not on file  Physical Activity: Not on file  Stress: Not on file  Social Connections: Not on file   Past Surgical History:  Procedure Laterality Date   ABDOMINOPLASTY     CHOLECYSTECTOMY     Past Surgical History:  Procedure Laterality Date   ABDOMINOPLASTY     CHOLECYSTECTOMY     Past Medical History:  Diagnosis Date   DEPRESSION/ANXIETY 08/08/2009   Diabetes mellitus without complication ([place])    IBS 02/02/2010   Neuropathy    Polyneuropathy 12/04/2010   Renal stones    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) 08/08/2009   BP 132/83  Pulse 100    Temp 98 F (36.7 C)    Ht 6' (1.829 m)    Wt 280 lb (127 kg)    BMI 37.97 kg/m   Opioid Risk Score:   Fall Risk Score:  `1  Depression screen PHQ 2/9  Depression screen [facility] 2/9 03/22/2021 12/19/2020 10/18/2020 07/06/2020 06/08/2020 05/11/2020 01/15/2020  Decreased Interest 1 0 1 1 1 1 2   Down, Depressed, Hopeless 1 0 1 1 1 1 2   PHQ - 2 Score 2 0 2 2 2 2 4   Altered sleeping - - - - - - 3  Tired, decreased energy - - - - - - 3  Change in appetite - - - - - - 0  Feeling bad or failure about yourself  - - - - - - 3  Trouble concentrating - - - - - - 1  Moving slowly or fidgety/restless - - - - - - 0  Suicidal  thoughts - - - - - - 0  PHQ-9 Score - - - - - - 14  Difficult doing work/chores - - - - - - -  Some recent data might be hidden    Review of Systems  Musculoskeletal:  Positive for back pain and gait problem.       Pain in both arms down to both wrist Pain in lower legs from broth knees down to feet  All other systems reviewed and are negative.     Objective:   Physical Exam Vitals and nursing note reviewed.  Constitutional:      Appearance: Normal appearance.  Cardiovascular:     Rate and Rhythm: Normal rate and regular rhythm.     Pulses: Normal pulses.     Heart sounds: Normal heart sounds.  Pulmonary:     Effort: Pulmonary effort is normal.     Breath sounds: Normal breath sounds.  Musculoskeletal:     Cervical back: Normal range of motion and neck supple.     Comments: Normal Muscle Bulk and Muscle Testing Reveals:  Upper Extremities: Full ROM and Muscle Strength 5/5  Lower Extremities: Full ROM and Muscle Strength 5/5 Arises from Table slowly using cane for support Narrow Based Gait     Skin:    General: Skin is warm.     Findings: Erythema present.  Neurological:     Mental Status: She is alert and oriented to person, place, and time.  Psychiatric:        Mood and Affect: Mood normal.        Behavior: Behavior normal.         Assessment & Plan:  1.Severe peripheral polyneuropathy of undetermined etiology.: 04/24/2021 Refilled: MS Contin 30 mg one tablet every 12 hours #60 and  MSIR 30 mg one tablet at HS, may take a tablet during the day as needed #60.Second script sen to accommodate scheduled appointment. We will continue the opioid monitoring program, this consists of regular clinic visits, examinations, urine drug screen, pill counts as well as use of [place] Controlled Substance Reporting system. A 12 month History has been reviewed on the [place] Controlled Substance Reporting System on 04/24/2021 2. Bilateral progressive upper extremity hand  pain/ Polyneuropathy. Continue with current medication regime. Tight Glucose Control. Adhere to healthy diet regime. Continue with current medication regimen with nortriptyline: 04/24/2021 3. Muscle Spasms: Continue current medication regimen with  Zanaflex. Continue to Monitor.  04/24/2021 4. Depression/Anxiety: Continue current medication regimen with Paxil and  Klonopin. Continue to  Monitor. 04/24/2021  5. Bilateral Chronic Knee Pain: No complaints Today.Continue HEP as Tolerated and Continue to Monitor. 04/24/2021. 6. Erythema Bilateral Feet/ ?.Cellulitis : Ms. Louth states she will go to Urgent Care/ F/U with PCP and Podiatry. She will send a My-Chart Message with Update. .    F/U in 1 month

## 2021-04-24 NOTE — Telephone Encounter (Signed)
Called and made the patient aware that she should let us know if she develops any chest pain or SOB since she does not want to complete the stress test. Patient verbalized understanding. Patient states that she is going to keep her appointment for an echocardiogram on 2/9. Patient is wanting to know if there are other alternatives for the stress test. We discussed having a Cardiac CT, but patient has already tried this and could not complete due to claustrophobia. She was not interested in having a heart cath either. Instructed the patient to let us know if she develops any chest pain or SOB and we could discuss treatment plan moving forward.

## 2021-04-24 NOTE — Telephone Encounter (Signed)
Task completed

## 2021-04-24 NOTE — Telephone Encounter (Signed)
Per patient pharmacy is out of Morphine Extended Release. It is available at [facility]. Rx will be cancelled at [facility] and new one sent in by Danella Sensing NP.  Patient informed. (Rx cancelled by Dreana Britz)>

## 2021-04-25 ENCOUNTER — Other Ambulatory Visit (HOSPITAL_COMMUNITY): Payer: Self-pay

## 2021-04-26 ENCOUNTER — Ambulatory Visit (INDEPENDENT_AMBULATORY_CARE_PROVIDER_SITE_OTHER): Payer: Medicaid Other | Admitting: Podiatry

## 2021-04-26 ENCOUNTER — Other Ambulatory Visit (HOSPITAL_COMMUNITY): Payer: Self-pay

## 2021-04-26 ENCOUNTER — Telehealth: Payer: Self-pay | Admitting: Registered Nurse

## 2021-04-26 ENCOUNTER — Ambulatory Visit (HOSPITAL_COMMUNITY): Payer: Medicaid Other

## 2021-04-26 DIAGNOSIS — Z91199 Patient's noncompliance with other medical treatment and regimen due to unspecified reason: Secondary | ICD-10-CM

## 2021-04-26 MED ORDER — MORPHINE SULFATE ER 30 MG PO TBCR: 30.0000 mg | EXTENDED_RELEASE_TABLET | Freq: Two times a day (BID) | ORAL | 0 refills | Status: DC

## 2021-04-26 NOTE — Telephone Encounter (Signed)
[facility] was called they  filled one week supply of MS Contin. Ms. Audi picked up prescription on 04/27/2021 PMP was Reviewed.  MS Contin e- scribed to [facility] :

## 2021-04-26 NOTE — Progress Notes (Signed)
No show

## 2021-04-27 ENCOUNTER — Ambulatory Visit (HOSPITAL_COMMUNITY): Payer: Medicaid Other

## 2021-04-28 ENCOUNTER — Telehealth: Payer: Self-pay | Admitting: Student

## 2021-04-28 NOTE — Telephone Encounter (Signed)
Patient returns call to nurse line. Patient reports she would like to speak with PCP now vs apt on 2/17.  Will forward to PCP.

## 2021-04-28 NOTE — Telephone Encounter (Signed)
Called pt and left HIPPA compliant voicemail that pt can call back if she still wants to discuss her sleep study results before her upcomming apt on 2/17 or she can wait and we can discuss at that visit.

## 2021-05-01 ENCOUNTER — Telehealth: Payer: Self-pay | Admitting: Student

## 2021-05-01 NOTE — Telephone Encounter (Signed)
Called pt and left HIPPA compliant VM. I will try to call pt again this afternoon.

## 2021-05-01 NOTE — Telephone Encounter (Signed)
Attempted to call pt again with results. Left HIPPA compliant VM. Advised pt to call and leave message if she would like me to try her again this evening.

## 2021-05-02 ENCOUNTER — Telehealth: Payer: Self-pay | Admitting: Registered Nurse

## 2021-05-02 ENCOUNTER — Telehealth: Payer: Self-pay

## 2021-05-02 ENCOUNTER — Other Ambulatory Visit (HOSPITAL_COMMUNITY): Payer: Self-pay

## 2021-05-02 ENCOUNTER — Other Ambulatory Visit: Payer: Self-pay | Admitting: Student

## 2021-05-02 DIAGNOSIS — E119 Type 2 diabetes mellitus without complications: Secondary | ICD-10-CM

## 2021-05-02 MED ORDER — MORPHINE SULFATE ER 30 MG PO TBCR
30.0000 mg | EXTENDED_RELEASE_TABLET | Freq: Two times a day (BID) | ORAL | 0 refills | Status: DC
  Filled 2021-05-02: qty 60, 30d supply, fill #0

## 2021-05-02 NOTE — Telephone Encounter (Signed)
PA submitted for Morphine Sulfate ER

## 2021-05-02 NOTE — Telephone Encounter (Signed)
MS Contin prescription canceled at [facility].  MS Contin e-scribed to [facility].  Ms. Duffell is aware via My- Chart Message.

## 2021-05-02 NOTE — Telephone Encounter (Signed)
Approved through 07/30/2021

## 2021-05-04 ENCOUNTER — Other Ambulatory Visit (HOSPITAL_COMMUNITY): Payer: Medicaid Other

## 2021-05-10 ENCOUNTER — Other Ambulatory Visit: Payer: Self-pay

## 2021-05-10 MED ORDER — BD PEN NEEDLE NANO 2ND GEN 32G X 4 MM MISC
11 refills | Status: DC
Start: 2021-05-10 — End: 2023-04-01

## 2021-05-10 NOTE — Progress Notes (Unsigned)
° ° °  SUBJECTIVE:   CHIEF COMPLAINT / HPI: *** due for pap and health maintenance things   *** Obstructive Sleep Apnea Pt had sleep study done in November which showed moderate OSA which recommendations for a CPAP titration study or autopap, fitted oral appliance or ENT evaluation.   T2DM Last HgbA1c on 07/04/20 was 7.8. Pt currently takes ***   HTN  Hypertriglyceridemia  Last lipid panel on 08/02/20 showed triglycerides of 473. Pt currently takes ***    PERTINENT  PMH / PSH: ***  OBJECTIVE:   There were no vitals taken for this visit. ***  General: NAD, pleasant, able to participate in exam Cardiac: RRR, no murmurs. Respiratory: CTAB, normal effort, No wheezes, rales or rhonchi Abdomen: Bowel sounds present, nontender, nondistended, no hepatosplenomegaly. Extremities: no edema or cyanosis. Skin: warm and dry, no rashes noted Neuro: alert, no obvious focal deficits Psych: Normal affect and mood  ASSESSMENT/PLAN:   No problem-specific Assessment & Plan notes found for this encounter.     Dr. [name], [place]    {    This will disappear when note is signed, click to select method of visit    :1}

## 2021-05-12 ENCOUNTER — Other Ambulatory Visit: Payer: Self-pay | Admitting: Registered Nurse

## 2021-05-12 ENCOUNTER — Encounter: Payer: Self-pay | Admitting: Student

## 2021-05-12 ENCOUNTER — Encounter: Payer: Medicaid Other | Admitting: Student

## 2021-05-15 ENCOUNTER — Other Ambulatory Visit (HOSPITAL_COMMUNITY): Payer: Medicaid Other

## 2021-05-15 ENCOUNTER — Encounter (HOSPITAL_COMMUNITY): Payer: Self-pay

## 2021-05-15 ENCOUNTER — Encounter (HOSPITAL_COMMUNITY): Payer: Self-pay | Admitting: Interventional Cardiology

## 2021-05-15 ENCOUNTER — Telehealth (HOSPITAL_COMMUNITY): Payer: Self-pay | Admitting: Interventional Cardiology

## 2021-05-15 NOTE — Progress Notes (Signed)
Verified appointment "no show" status with Bryn Gulling at 12:57.

## 2021-05-15 NOTE — Telephone Encounter (Signed)
Patients mother called at 1:07 to reschedule echocardiogram after her appt time on 05/15/21 that patient has NO SHOWED x 2 and rescheduled multiple times. Mother states she has neuropathy and will need to have echo rescheduled at [facility]. She states that patient will call us back later to reschedule. NO SHOW letter is being mailed to follow protocol.

## 2021-05-19 ENCOUNTER — Other Ambulatory Visit: Payer: Self-pay

## 2021-05-19 ENCOUNTER — Encounter: Payer: Medicaid Other | Attending: Physical Medicine & Rehabilitation | Admitting: Registered Nurse

## 2021-05-19 ENCOUNTER — Encounter: Payer: Self-pay | Admitting: Registered Nurse

## 2021-05-19 VITALS — BP 129/83 | HR 96 | Ht 72.0 in | Wt 280.2 lb

## 2021-05-19 DIAGNOSIS — Z79891 Long term (current) use of opiate analgesic: Secondary | ICD-10-CM | POA: Insufficient documentation

## 2021-05-19 DIAGNOSIS — Z5181 Encounter for therapeutic drug level monitoring: Secondary | ICD-10-CM | POA: Diagnosis not present

## 2021-05-19 DIAGNOSIS — F341 Dysthymic disorder: Secondary | ICD-10-CM | POA: Diagnosis not present

## 2021-05-19 DIAGNOSIS — G894 Chronic pain syndrome: Secondary | ICD-10-CM | POA: Diagnosis not present

## 2021-05-19 DIAGNOSIS — G629 Polyneuropathy, unspecified: Secondary | ICD-10-CM | POA: Insufficient documentation

## 2021-05-19 DIAGNOSIS — E1140 Type 2 diabetes mellitus with diabetic neuropathy, unspecified: Secondary | ICD-10-CM | POA: Insufficient documentation

## 2021-05-19 DIAGNOSIS — M62838 Other muscle spasm: Secondary | ICD-10-CM | POA: Diagnosis not present

## 2021-05-19 MED ORDER — MORPHINE SULFATE ER 30 MG PO TBCR: 30.0000 mg | EXTENDED_RELEASE_TABLET | Freq: Two times a day (BID) | ORAL | 0 refills | Status: DC

## 2021-05-19 MED ORDER — MORPHINE SULFATE 30 MG PO TABS: ORAL_TABLET | ORAL | 0 refills | Status: DC

## 2021-05-19 NOTE — Progress Notes (Signed)
Subjective:    Patient ID: [name], female    DOB: May 18, 1970, 51 y.o.   MRN: [id number]  HPI: [name] is a 51 y.o. female who returns for follow up appointment for chronic pain and medication refill. She states her pain is located in her bilateral hands and bilateral feet with tingling and burning. She rates her pain 6. Her current exercise regime is walking and performing stretching exercises.  [name] Morphine equivalent is 15.00 MME.   Last UDS was Performed on 03/22/2021, it was consistent.     Pain Inventory Average Pain 7 Pain Right Now 6 My pain is constant, sharp, burning, dull, stabbing, tingling, and aching  In the last 24 hours, has pain interfered with the following? General activity 9 Relation with others 9 Enjoyment of life 9 What TIME of day is your pain at its worst? night Sleep (in general) Poor  Pain is worse with: walking, bending, standing, and some activites Pain improves with: rest, medication, and   Relief from Meds: 7  Family History  Problem Relation Age of Onset   Fibromyalgia Mother    Aortic aneurysm Mother    Stroke Maternal Grandmother    Colon cancer Maternal Grandmother    Heart Problems Paternal Grandmother    Dementia Paternal Grandmother    Heart attack Paternal Grandfather    Social History   Socioeconomic History   Marital status: Legally Separated    Spouse name: Not on file   Number of children: 1   Years of education: Not on file   Highest education level: Bachelor's degree (e.g., BA, AB, BS)  Occupational History   Occupation: Disabled  Tobacco Use   Smoking status: Never   Smokeless tobacco: Never  Vaping Use   Vaping Use: Never used  Substance and Sexual Activity   Alcohol use: No    Alcohol/week: 0.0 standard drinks   Drug use: No    Comment: rx opiods   Sexual activity: Yes    Birth control/protection: Condom  Other Topics Concern   Not on file  Social History Narrative   Not on  file   Social Determinants of Health   Financial Resource Strain: Not on file  Food Insecurity: Not on file  Transportation Needs: Not on file  Physical Activity: Not on file  Stress: Not on file  Social Connections: Not on file   Past Surgical History:  Procedure Laterality Date   ABDOMINOPLASTY     CHOLECYSTECTOMY     Past Surgical History:  Procedure Laterality Date   ABDOMINOPLASTY     CHOLECYSTECTOMY     Past Medical History:  Diagnosis Date   DEPRESSION/ANXIETY 08/08/2009   Diabetes mellitus without complication ([place])    IBS 02/02/2010   Neuropathy    Polyneuropathy 12/04/2010   Renal stones    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) 08/08/2009   BP 129/83    Pulse 96    Ht 6' (1.829 m)    Wt 280 lb 3.2 oz (127.1 kg)    SpO2 96%    BMI 38.00 kg/m   Opioid Risk Score:   Fall Risk Score:  `1  Depression screen PHQ 2/9  Depression screen [facility] 2/9 05/19/2021 04/24/2021 03/22/2021 12/19/2020 10/18/2020 07/06/2020 06/08/2020  Decreased Interest 3 1 1  0 1 1 1   Down, Depressed, Hopeless 3 1 1  0 1 1 1   PHQ - 2 Score 6 2 2  0 2 2  2  Altered sleeping - - - - - - -  Tired, decreased energy - - - - - - -  Change in appetite - - - - - - -  Feeling bad or failure about yourself  - - - - - - -  Trouble concentrating - - - - - - -  Moving slowly or fidgety/restless - - - - - - -  Suicidal thoughts - - - - - - -  PHQ-9 Score - - - - - - -  Difficult doing work/chores - - - - - - -  Some recent data might be hidden     Review of Systems  Constitutional: Negative.   HENT: Negative.    Eyes: Negative.   Respiratory: Negative.    Cardiovascular: Negative.   Gastrointestinal: Negative.   Endocrine: Negative.   Genitourinary: Negative.   Musculoskeletal:  Positive for back pain.  Skin: Negative.   Allergic/Immunologic: Negative.   Neurological:  Positive for headaches.  Hematological: Negative.    Psychiatric/Behavioral:  Positive for sleep disturbance.       Objective:   Physical Exam Vitals and nursing note reviewed.  Constitutional:      Appearance: Normal appearance. She is obese.  Cardiovascular:     Rate and Rhythm: Normal rate and regular rhythm.     Pulses: Normal pulses.     Heart sounds: Normal heart sounds.  Pulmonary:     Effort: Pulmonary effort is normal.     Breath sounds: Normal breath sounds.  Musculoskeletal:     Cervical back: Normal range of motion and neck supple.     Comments: Normal Muscle Bulk and Muscle Testing Reveals:  Upper Extremities: Full ROM and Muscle Strength 5/5 Lower Extremities : Full ROM  and  Muscle Strength 5/5  Arises from Table with ease Narrow Based Gait     Skin:    General: Skin is warm and dry.  Neurological:     Mental Status: She is alert and oriented to person, place, and time.  Psychiatric:        Mood and Affect: Mood normal.        Behavior: Behavior normal.         Assessment & Plan:  1.Severe peripheral polyneuropathy of undetermined etiology.: 05/19/2021 Refilled: MS Contin 30 mg one tablet every 12 hours #60 and  MSIR 30 mg one tablet at HS, may take a tablet during the day as needed #60.We will continue the opioid monitoring program, this consists of regular clinic visits, examinations, urine drug screen, pill counts as well as use of [place] Controlled Substance Reporting system. A 12 month History has been reviewed on the [place] Controlled Substance Reporting System on 05/19/2021 2. Bilateral progressive upper extremity hand pain/ Polyneuropathy. Continue with current medication regime. Tight Glucose Control. Adhere to healthy diet regime. Continue with current medication regimen with nortriptyline: 05/19/2021 3. Muscle Spasms: Continue current medication regimen with  Zanaflex. Continue to Monitor.  05/19/2021 4. Depression/Anxiety: Continue current medication regimen with Paxil and  Klonopin.  Continue to Monitor. 05/19/2021  5. Bilateral Chronic Knee Pain: No complaints Today.Continue HEP as Tolerated and Continue to Monitor. 05/19/2021.   F/U in 1 month

## 2021-05-20 ENCOUNTER — Encounter: Payer: Self-pay | Admitting: Student

## 2021-05-20 DIAGNOSIS — E119 Type 2 diabetes mellitus without complications: Secondary | ICD-10-CM

## 2021-05-20 DIAGNOSIS — E118 Type 2 diabetes mellitus with unspecified complications: Secondary | ICD-10-CM

## 2021-05-22 ENCOUNTER — Other Ambulatory Visit: Payer: Self-pay | Admitting: Student

## 2021-05-22 ENCOUNTER — Telehealth: Payer: Self-pay

## 2021-05-22 DIAGNOSIS — E119 Type 2 diabetes mellitus without complications: Secondary | ICD-10-CM

## 2021-05-22 DIAGNOSIS — E118 Type 2 diabetes mellitus with unspecified complications: Secondary | ICD-10-CM

## 2021-05-22 DIAGNOSIS — E781 Pure hyperglyceridemia: Secondary | ICD-10-CM

## 2021-05-22 MED ORDER — VICTOZA 18 MG/3ML ~~LOC~~ SOPN: PEN_INJECTOR | SUBCUTANEOUS | 0 refills | Status: DC

## 2021-05-22 MED ORDER — ACCU-CHEK GUIDE VI STRP: ORAL_STRIP | 11 refills | Status: DC

## 2021-05-22 MED ORDER — GLIPIZIDE 10 MG PO TABS: ORAL_TABLET | ORAL | 2 refills | Status: DC

## 2021-05-22 MED ORDER — FENOFIBRATE 145 MG PO TABS: ORAL_TABLET | ORAL | 2 refills | Status: DC

## 2021-05-22 MED ORDER — ENALAPRIL MALEATE 10 MG PO TABS: ORAL_TABLET | ORAL | 2 refills | Status: DC

## 2021-05-22 MED ORDER — ATORVASTATIN CALCIUM 80 MG PO TABS: ORAL_TABLET | ORAL | 3 refills | Status: DC

## 2021-05-22 NOTE — Progress Notes (Signed)
Refills requested, sent in

## 2021-05-22 NOTE — Telephone Encounter (Signed)
PA for Morphine ER sent to insurance through CoverMyMeds

## 2021-05-24 DIAGNOSIS — G4730 Sleep apnea, unspecified: Secondary | ICD-10-CM

## 2021-05-24 HISTORY — DX: Sleep apnea, unspecified: G47.30

## 2021-05-25 ENCOUNTER — Ambulatory Visit: Payer: Medicaid Other | Admitting: Obstetrics

## 2021-05-26 ENCOUNTER — Other Ambulatory Visit: Payer: Self-pay | Admitting: Student

## 2021-05-29 ENCOUNTER — Telehealth: Payer: Self-pay | Admitting: Registered Nurse

## 2021-05-29 ENCOUNTER — Other Ambulatory Visit: Payer: Self-pay

## 2021-05-29 MED ORDER — MORPHINE SULFATE ER 30 MG PO TBCR: 30.0000 mg | EXTENDED_RELEASE_TABLET | Freq: Two times a day (BID) | ORAL | 0 refills | Status: DC

## 2021-05-29 MED ORDER — ENALAPRIL MALEATE 10 MG PO TABS
ORAL_TABLET | ORAL | 2 refills | Status: DC
Start: 2021-05-29 — End: 2021-12-05

## 2021-05-29 NOTE — Telephone Encounter (Signed)
Received mychart message that patient did not receive enalapril. Per chart review, medication was set to "print" on 2/27. Resending electronically. Receipt confirmed on 05/29/21 at 1630. ? ?Talbot Grumbling, RN ? ?

## 2021-05-29 NOTE — Telephone Encounter (Signed)
PMP was Reviewed.  ?MS Contin e- scribed to [facility] .  ?Placed a call to [facility] to cancel MS Contin 30 mg prescription was on hold for 45 minutes, they never picked up the phone. Marland Kitchen  ?Sent a My-Chart message to [name] regarding the above.  ?

## 2021-06-01 ENCOUNTER — Other Ambulatory Visit: Payer: Self-pay

## 2021-06-01 ENCOUNTER — Encounter: Payer: Self-pay | Admitting: Student

## 2021-06-01 ENCOUNTER — Ambulatory Visit (INDEPENDENT_AMBULATORY_CARE_PROVIDER_SITE_OTHER): Payer: Medicaid Other | Admitting: Student

## 2021-06-01 VITALS — BP 145/66 | HR 95 | Wt 286.8 lb

## 2021-06-01 DIAGNOSIS — G44229 Chronic tension-type headache, not intractable: Secondary | ICD-10-CM

## 2021-06-01 DIAGNOSIS — I152 Hypertension secondary to endocrine disorders: Secondary | ICD-10-CM

## 2021-06-01 DIAGNOSIS — E1159 Type 2 diabetes mellitus with other circulatory complications: Secondary | ICD-10-CM | POA: Diagnosis not present

## 2021-06-01 DIAGNOSIS — E118 Type 2 diabetes mellitus with unspecified complications: Secondary | ICD-10-CM

## 2021-06-01 DIAGNOSIS — G4733 Obstructive sleep apnea (adult) (pediatric): Secondary | ICD-10-CM

## 2021-06-01 LAB — POCT GLYCOSYLATED HEMOGLOBIN (HGB A1C): HbA1c, POC (controlled diabetic range): 8.7 % — AB (ref 0.0–7.0)

## 2021-06-01 NOTE — Patient Instructions (Signed)
It was great to see you! Thank you for allowing me to participate in your care! ? ?I recommend that you always bring your medications to each appointment as this makes it easy to ensure you are on the correct medications and helps [place] not miss when refills are needed. ? ?Our plans for today:  ?- A CPAP was ordered for you. We will be in touch about how to get it to you.  ?- I am referring you to our pharmacy team to help manage your diabetic medications ?-Please return within a month for a pap smear and wellness check ? ? ?Take care and seek immediate care sooner if you develop any concerns.  ? ?Dr. [name], DO ?[facility] ? ?

## 2021-06-01 NOTE — Assessment & Plan Note (Signed)
Neuro exam is reassuring.  It is also reassuring that headaches are better with Tylenol.  Description of headaches do not sound like migraines but more like tension type headaches.  Cause is likely multifactorial including medication induced, lifestyle, OSA, and stress. ?-Continue Tylenol as needed ?-Continue to monitor ?

## 2021-06-01 NOTE — Progress Notes (Addendum)
? ? ?  SUBJECTIVE:  ? ?CHIEF COMPLAINT / HPI:  ? ?Vaginal irritation ?For past month has had thin sticky clear and slick discharge with no odor and has vaginal itching but no pain. Hasn't had sex in 6 months.  ?LMP 2 years ago, no bleeding since. Prefers to address this further at next visit when she has pap smear. ? ?OSA ?Sleep study in Nov showed moderate OSA with recommendations for CPAP titration sleep study or auto pap, fitted oral device, or ENT evaluation. Pt would like CPAP with auto pap ordered.  ? ?T2DM ?A1c today of 8.1, up from 7.8 on 07/04/20. ?Pt currently takes glipizide '10mg'$  TID, Victoza 1.'8mg'$  daily, metformin '1000mg'$  BID. Patient is a vegetarian and states she eats too much [place] which she knows is not great for her diabetes. ? ?Headaches  ?Headaches for past year and a half, occur at least once a day and tylenol knocks them out. Describes them as achy and pulsating. Drinks about 1/2 gallon of water per day.  ? ? ? ?PERTINENT  PMH / PSH: Type 2 diabetes, IBS, depression, HLD, obesity, chronic pain ? ?OBJECTIVE:  ? ?BP (!) 145/66   Pulse 95   Wt 286 lb 12.8 oz (130.1 kg)   SpO2 100%   BMI 38.90 kg/m?   ? ?General: NAD, pleasant, able to participate in exam ?Cardiac: RRR, no murmurs. ?Respiratory: CTAB, normal effort ?Skin: warm and dry ?Neuro: Cranial nerves II through XII intact, sensation intact, finger-nose-finger test normal ?Psych: Mood and affect appropriate for situation ? ?ASSESSMENT/PLAN:  ? ?Vaginal irritation ?-Patient will return to further discuss this at time of Pap smear within next month.  ? ?OSA ?-CPAP ordered ? ?T2DM ?A1c of 8.1 today with goal of 7 or lower.   ?-Will refer patient to pharmacy team as patient is currently on max dose of Victoza, glipizide, and metformin ? ?Headaches ?Neuro exam is reassuring.  It is also reassuring that headaches are better with Tylenol.  Description of headaches do not sound like migraines but more like tension type headaches.  Cause is likely  multifactorial including medication induced, lifestyle, OSA, and stress. ?-Continue Tylenol as needed ?-Continue to monitor ?  ? ? ?Dr. [name], DO ?[place]  ? ? ? ? ?

## 2021-06-01 NOTE — Assessment & Plan Note (Signed)
A1c of 8.1 today with goal of 7 or lower.   ?-Will refer patient to pharmacy team as patient is currently on max dose of Victoza, glipizide, and metformin ?

## 2021-06-04 ENCOUNTER — Other Ambulatory Visit: Payer: Self-pay | Admitting: Student

## 2021-06-04 DIAGNOSIS — E119 Type 2 diabetes mellitus without complications: Secondary | ICD-10-CM

## 2021-06-09 ENCOUNTER — Telehealth: Payer: Self-pay | Admitting: Pharmacist

## 2021-06-09 NOTE — Telephone Encounter (Signed)
Attempted to contact patient to request that she make appointment at the request of Dr. Ronnald Ramp to see me in the pharmacy clinic.   ? ?[place] office staff had left her a message to schedule last week and she has not make an appointment at this time.   ? ? ?Left HIPAA compliant voice mail requesting call back to schedule an appointment at  [phone]. ? ? ? ?

## 2021-06-09 NOTE — Telephone Encounter (Signed)
-----   Message from [name], DO sent at 06/02/2021  3:14 PM EST ----- ?Regarding: Diabetes management ?[place] pharmacy team,  ? ?I would like to refer this patient to you. She is on max doses of glipizide, victoza and metformin with A1c above 8.  ? ?Thanks, ?[name] ? ?

## 2021-06-12 ENCOUNTER — Other Ambulatory Visit: Payer: Self-pay

## 2021-06-12 ENCOUNTER — Ambulatory Visit (HOSPITAL_COMMUNITY): Payer: Medicaid Other | Attending: Cardiology

## 2021-06-12 DIAGNOSIS — R072 Precordial pain: Secondary | ICD-10-CM | POA: Diagnosis present

## 2021-06-12 DIAGNOSIS — R079 Chest pain, unspecified: Secondary | ICD-10-CM

## 2021-06-12 LAB — ECHOCARDIOGRAM COMPLETE
Area-P 1/2: 3.15 cm2
S' Lateral: 3.4 cm

## 2021-06-14 ENCOUNTER — Encounter: Payer: Self-pay | Admitting: Interventional Cardiology

## 2021-06-16 ENCOUNTER — Encounter: Payer: Self-pay | Admitting: Interventional Cardiology

## 2021-06-16 NOTE — Telephone Encounter (Signed)
I placed call to patient and left message to call office.  Echo results were sent to patient through my chart on 06/12/21.  Echo results sent to patient in this my chart message. ?Will forward to management for follow up.  ?

## 2021-06-19 ENCOUNTER — Other Ambulatory Visit: Payer: Self-pay

## 2021-06-19 ENCOUNTER — Telehealth (HOSPITAL_COMMUNITY): Payer: Self-pay | Admitting: Radiology

## 2021-06-19 ENCOUNTER — Encounter: Payer: Medicaid Other | Attending: Physical Medicine & Rehabilitation | Admitting: Registered Nurse

## 2021-06-19 ENCOUNTER — Encounter: Payer: Self-pay | Admitting: Registered Nurse

## 2021-06-19 VITALS — BP 132/82 | HR 100 | Ht 72.0 in | Wt 283.0 lb

## 2021-06-19 DIAGNOSIS — G894 Chronic pain syndrome: Secondary | ICD-10-CM | POA: Diagnosis not present

## 2021-06-19 DIAGNOSIS — G629 Polyneuropathy, unspecified: Secondary | ICD-10-CM | POA: Diagnosis not present

## 2021-06-19 DIAGNOSIS — E1140 Type 2 diabetes mellitus with diabetic neuropathy, unspecified: Secondary | ICD-10-CM | POA: Diagnosis not present

## 2021-06-19 DIAGNOSIS — Z5181 Encounter for therapeutic drug level monitoring: Secondary | ICD-10-CM | POA: Insufficient documentation

## 2021-06-19 DIAGNOSIS — Z79891 Long term (current) use of opiate analgesic: Secondary | ICD-10-CM | POA: Insufficient documentation

## 2021-06-19 DIAGNOSIS — F341 Dysthymic disorder: Secondary | ICD-10-CM | POA: Insufficient documentation

## 2021-06-19 DIAGNOSIS — R000 Tachycardia, unspecified: Secondary | ICD-10-CM | POA: Insufficient documentation

## 2021-06-19 MED ORDER — MORPHINE SULFATE 30 MG PO TABS: ORAL_TABLET | ORAL | 0 refills | Status: DC

## 2021-06-19 MED ORDER — MORPHINE SULFATE ER 30 MG PO TBCR: 30.0000 mg | EXTENDED_RELEASE_TABLET | Freq: Two times a day (BID) | ORAL | 0 refills | Status: DC

## 2021-06-19 NOTE — Progress Notes (Signed)
? ?Subjective:  ? ? Patient ID: [name], female    DOB: [birth year], 51 y.o.   MRN: [id number] ? ?HPI: [name] is a 51 y.o. female who returns for follow up appointment for chronic pain and medication refill. She states her pain is located in her bilateral hands and bilateral feet with tingling and burning. She rates her pain 8. Her current exercise regime is walking and performing stretching exercises. ? ?[name] Morphine equivalent is 120.00 MME.   Last UDS was Performed on 03/22/2022, it was consistent.  ?  ? ? ?Pain Inventory ?Average Pain 7 ?Pain Right Now 8 ?My pain is constant, sharp, burning, dull, stabbing, tingling, and aching ? ?In the last 24 hours, has pain interfered with the following? ?General activity 8 ?Relation with others 8 ?Enjoyment of life 8 ?What TIME of day is your pain at its worst? morning  ?Sleep (in general) Poor ? ?Pain is worse with: walking, bending, standing, and some activites ?Pain improves with: rest and medication ?Relief from Meds: 8 ? ?Family History  ?Problem Relation Age of Onset  ? Fibromyalgia Mother   ? Aortic aneurysm Mother   ? Stroke Maternal Grandmother   ? Colon cancer Maternal Grandmother   ? Heart Problems Paternal Grandmother   ? Dementia Paternal Grandmother   ? Heart attack Paternal Grandfather   ? ?Social History  ? ?Socioeconomic History  ? Marital status: Legally Separated  ?  Spouse name: Not on file  ? Number of children: 1  ? Years of education: Not on file  ? Highest education level: Bachelor's degree (e.g., BA, AB, BS)  ?Occupational History  ? Occupation: Disabled  ?Tobacco Use  ? Smoking status: Never  ? Smokeless tobacco: Never  ?Vaping Use  ? Vaping Use: Never used  ?Substance and Sexual Activity  ? Alcohol use: No  ?  Alcohol/week: 0.0 standard drinks  ? Drug use: No  ?  Comment: rx opiods  ? Sexual activity: Yes  ?  Birth control/protection: Condom  ?Other Topics Concern  ? Not on file  ?Social History Narrative  ? Not  on file  ? ?Social Determinants of Health  ? ?Financial Resource Strain: Not on file  ?Food Insecurity: Not on file  ?Transportation Needs: Not on file  ?Physical Activity: Not on file  ?Stress: Not on file  ?Social Connections: Not on file  ? ?Past Surgical History:  ?Procedure Laterality Date  ? ABDOMINOPLASTY    ? CHOLECYSTECTOMY    ? ?Past Surgical History:  ?Procedure Laterality Date  ? ABDOMINOPLASTY    ? CHOLECYSTECTOMY    ? ?Past Medical History:  ?Diagnosis Date  ? DEPRESSION/ANXIETY 08/08/2009  ? Diabetes mellitus without complication ([place])   ? IBS 02/02/2010  ? Neuropathy   ? Polyneuropathy 12/04/2010  ? Renal stones   ? 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 ?BP 132/82   Pulse (!) 109   Ht 6' (1.829 m)   Wt 283 lb (128.4 kg)   SpO2 98%   BMI 38.38 kg/m?  ? ?Opioid Risk Score:   ?Fall Risk Score:  `1 ? ?Depression screen PHQ 2/9 ? ? ?  05/19/2021  ?  2:48 PM 04/24/2021  ?  2:45 PM 03/22/2021  ?  3:15 PM 12/19/2020  ?  3:17 PM 10/18/2020  ?  3:32 PM 07/06/2020  ?  3:25 PM 06/08/2020  ?  3:22 PM  ?Depression screen PHQ  2/9  ?Decreased Interest '3 1 1 '$ 0 '1 1 1  '$ ?Down, Depressed, Hopeless '3 1 1 '$ 0 '1 1 1  '$ ?PHQ - 2 Score '6 2 2 '$ 0 '2 2 2  '$ ?  ?Review of Systems  ?Musculoskeletal:   ?     Bilateral hand and foot pain  ?All other systems reviewed and are negative. ? ?   ?Objective:  ? Physical Exam ?Vitals and nursing note reviewed.  ?Constitutional:   ?   Appearance: Normal appearance. She is obese.  ?Cardiovascular:  ?   Rate and Rhythm: Normal rate and regular rhythm.  ?   Pulses: Normal pulses.  ?   Heart sounds: Normal heart sounds.  ?Pulmonary:  ?   Effort: Pulmonary effort is normal.  ?   Breath sounds: Normal breath sounds.  ?Musculoskeletal:  ?   Cervical back: Normal range of motion and neck supple.  ?   Comments: Normal Muscle Bulk and Muscle Testing Reveals:  ?Upper Extremities: Full ROM and Muscle Strength 5/5 ? Lower  Extremities Full ROM and Muscle Strength 5/5 ?Arises from Table slowly using cane for support ?Narrow Based Gait  ?   ?Skin: ?   General: Skin is warm and dry.  ?Neurological:  ?   Mental Status: She is alert and oriented to person, place, and time.  ?Psychiatric:     ?   Mood and Affect: Mood normal.     ?   Behavior: Behavior normal.  ? ? ? ? ? ?   ?Assessment & Plan:  ?1.Severe peripheral polyneuropathy of undetermined etiology.: 06/19/2021 ?Refilled: MS Contin 30 mg one tablet every 12 hours #60 and  MSIR 30 mg one tablet at HS, may take a tablet during the day as needed #60.We will continue the opioid monitoring program, this consists of regular clinic visits, examinations, urine drug screen, pill counts as well as use of [place] Controlled Substance Reporting system. A 12 month History has been reviewed on the [place] Controlled Substance Reporting System on 06/19/2021 ?2. Bilateral progressive upper extremity hand pain/ Polyneuropathy. Continue with current medication regime. Tight Glucose Control. Adhere to healthy diet regime. Continue with current medication regimen with nortriptyline: 06/19/2021 ?3. Muscle Spasms: Continue current medication regimen with  Zanaflex. Continue to Monitor.  06/19/2021 ?4. Depression/Anxiety: Continue current medication regimen with Paxil and  Klonopin. Continue to Monitor. 06/19/2021 ? 5. Bilateral Chronic Knee Pain: No complaints Today.Continue HEP as Tolerated and Continue to Monitor. 06/19/2021. ?  ?F/U in 1 month ? ?

## 2021-06-19 NOTE — Telephone Encounter (Signed)
I left a message on the answering machine for Ms. Kozub to give me a call at [phone]. I will be in the office until 3:30 pm today and will return at 8:00 am. ?

## 2021-06-20 ENCOUNTER — Telehealth: Payer: Self-pay

## 2021-06-20 NOTE — Telephone Encounter (Signed)
PA for Morphine sent to insurance through CoverMyMeds ?

## 2021-06-20 NOTE — Telephone Encounter (Signed)
Request Reference Number: [id number]. MORPHINE SUL TAB '30MG'$  is approved through 12/21/2021. For further questions, call [employer] at [phone]. ?

## 2021-06-26 ENCOUNTER — Other Ambulatory Visit: Payer: Self-pay | Admitting: Student

## 2021-06-26 DIAGNOSIS — G4733 Obstructive sleep apnea (adult) (pediatric): Secondary | ICD-10-CM

## 2021-06-28 ENCOUNTER — Encounter: Payer: Medicaid Other | Admitting: Student

## 2021-06-28 NOTE — Progress Notes (Deleted)
? ? ?  SUBJECTIVE:  ? ?CHIEF COMPLAINT / HPI:  ? ?*** ?Vaginal irritation ? ? ?PERTINENT  PMH / PSH: *** ? ?OBJECTIVE:  ? ?There were no vitals taken for this visit. *** ? ?General: NAD, pleasant, able to participate in exam ?Cardiac: RRR, no murmurs. ?Respiratory: CTAB, normal effort, No wheezes, rales or rhonchi ?Abdomen: Bowel sounds present, nontender, nondistended, no hepatosplenomegaly. ?Extremities: no edema or cyanosis. ?Skin: warm and dry, no rashes noted ?Neuro: alert, no obvious focal deficits ?Psych: Normal affect and mood ? ?ASSESSMENT/PLAN:  ? ?No problem-specific Assessment & Plan notes found for this encounter. ?  ? ? ?Dr. [name], DO ?[place]  ? ? ?{    This will disappear when note is signed, click to select method of visit    :1} ? ?

## 2021-07-01 ENCOUNTER — Other Ambulatory Visit: Payer: Self-pay | Admitting: Student

## 2021-07-01 DIAGNOSIS — E119 Type 2 diabetes mellitus without complications: Secondary | ICD-10-CM

## 2021-07-10 ENCOUNTER — Encounter: Payer: Medicaid Other | Admitting: Student

## 2021-07-10 NOTE — Progress Notes (Deleted)
? ? ?  SUBJECTIVE:  ? ?Chief compliant/HPI: annual examination ? ?[name] is a 51 y.o. who presents today for an annual exam.  ? ? ?History tabs reviewed and updated ***.  ? ?Review of systems form reviewed and notable for ***.  ? ?OBJECTIVE:  ? ?There were no vitals taken for this visit.  ?*** ? ?ASSESSMENT/PLAN:  ? ?No problem-specific Assessment & Plan notes found for this encounter. ?  ? ?Annual Examination  ?See AVS for age appropriate recommendations  ?PHQ score ***, reviewed and discussed.  ?BP reviewed and at goal ***.  ?Asked about intimate partner violence and resources given as appropriate  ?Advance directives discussion *** ? ?Considered the following items based upon USPSTF recommendations: ?Diabetes screening: {discussed/ordered:14545} ?Screening for elevated cholesterol: {discussed/ordered:14545} ?HIV testing:  completed previously ?Hepatitis C:  completed previously ?Hepatitis B: {discussed/ordered:14545} ?Syphilis if at high risk: {discussed/ordered:14545} ?GC/CT {GC/CT screening :23818} ?Osteoporosis screening considered based upon risk of fracture from [facility] calculator. Major osteoporotic fracture risk is ***%. DEXA {ordered not order:23822}.  ?Reviewed risk factors for latent tuberculosis and {not indicated/requested/declined:14582} ? ? ?Discussed family history, BRCA testing {not indicated/requested/declined:14582}. Tool used to risk stratify was ***.  ?Cervical cancer screening: {PAPTYPE:23819} ?Breast cancer screening: {mammoscreen:23820} ?Colorectal cancer screening: {crcscreen:23821::"discussed, colonoscopy ordered"} ?Lung cancer screening: {discussed/declined/written BOER:84128}. See documentation below regarding indications/risks/benefits.  ?Vaccinations ***.  ? ?Follow up in 1 *** year or sooner if indicated.  ? ? ?[name], DO ?[place]   ?

## 2021-07-13 ENCOUNTER — Ambulatory Visit: Payer: Medicaid Other | Admitting: Pharmacist

## 2021-07-18 ENCOUNTER — Encounter: Payer: Self-pay | Admitting: Registered Nurse

## 2021-07-18 ENCOUNTER — Encounter: Payer: Medicaid Other | Attending: Physical Medicine & Rehabilitation | Admitting: Registered Nurse

## 2021-07-18 VITALS — BP 117/83 | HR 108 | Ht 72.0 in | Wt 280.0 lb

## 2021-07-18 DIAGNOSIS — F341 Dysthymic disorder: Secondary | ICD-10-CM | POA: Diagnosis not present

## 2021-07-18 DIAGNOSIS — R000 Tachycardia, unspecified: Secondary | ICD-10-CM | POA: Insufficient documentation

## 2021-07-18 DIAGNOSIS — G894 Chronic pain syndrome: Secondary | ICD-10-CM | POA: Insufficient documentation

## 2021-07-18 DIAGNOSIS — Z79891 Long term (current) use of opiate analgesic: Secondary | ICD-10-CM | POA: Diagnosis not present

## 2021-07-18 DIAGNOSIS — Z5181 Encounter for therapeutic drug level monitoring: Secondary | ICD-10-CM | POA: Insufficient documentation

## 2021-07-18 DIAGNOSIS — E1140 Type 2 diabetes mellitus with diabetic neuropathy, unspecified: Secondary | ICD-10-CM | POA: Diagnosis not present

## 2021-07-18 MED ORDER — MORPHINE SULFATE ER 30 MG PO TBCR: 30.0000 mg | EXTENDED_RELEASE_TABLET | Freq: Two times a day (BID) | ORAL | 0 refills | Status: DC

## 2021-07-18 MED ORDER — MORPHINE SULFATE 30 MG PO TABS: ORAL_TABLET | ORAL | 0 refills | Status: DC

## 2021-07-18 MED ORDER — CLONAZEPAM 0.5 MG PO TABS: ORAL_TABLET | ORAL | 3 refills | Status: DC

## 2021-07-18 NOTE — Progress Notes (Signed)
? ?Subjective:  ? ? Patient ID: [name], female    DOB: June 06, 1970, 51 y.o.   MRN: [id number] ? ?HPI: [name] is a 51 y.o. female who returns for follow up appointment for chronic pain and medication refill. She states her pain is located in her bilateral hands and bilateral feet with tingling and burning. She rates her pain 7. Her current exercise regime is walking and performing stretching exercises. ? ?Ms. Gadomski arrived tachycadic, apical pulse. Cardiology following.  ? ?Ms. Molzahn Morphine equivalent is 120.00 MME. She  is also prescribed Clonazepam  .We have discussed the black box warning of using opioids and benzodiazepines. I highlighted the dangers of using these drugs together and discussed the adverse events including respiratory suppression, overdose, cognitive impairment and importance of compliance with current regimen. We will continue to monitor and adjust as indicated.  ? ? UDS Ordered today.  ?  ? ? ?Pain Inventory ?Average Pain 7 ?Pain Right Now 7 ?My pain is intermittent, constant, sharp, burning, dull, stabbing, tingling, and aching ? ?In the last 24 hours, has pain interfered with the following? ?General activity 9 ?Relation with others 9 ?Enjoyment of life 9 ?What TIME of day is your pain at its worst? morning  and varies ?Sleep (in general) Poor ? ?Pain is worse with: walking, bending, standing, and some activites ?Pain improves with: rest and medication ?Relief from Meds: 7 ? ?Family History  ?Problem Relation Age of Onset  ? Fibromyalgia Mother   ? Aortic aneurysm Mother   ? Stroke Maternal Grandmother   ? Colon cancer Maternal Grandmother   ? Heart Problems Paternal Grandmother   ? Dementia Paternal Grandmother   ? Heart attack Paternal Grandfather   ? ?Social History  ? ?Socioeconomic History  ? Marital status: Legally Separated  ?  Spouse name: Not on file  ? Number of children: 1  ? Years of education: Not on file  ? Highest education level: Bachelor's  degree (e.g., BA, AB, BS)  ?Occupational History  ? Occupation: Disabled  ?Tobacco Use  ? Smoking status: Never  ? Smokeless tobacco: Never  ?Vaping Use  ? Vaping Use: Never used  ?Substance and Sexual Activity  ? Alcohol use: No  ?  Alcohol/week: 0.0 standard drinks  ? Drug use: No  ?  Comment: rx opiods  ? Sexual activity: Yes  ?  Birth control/protection: Condom  ?Other Topics Concern  ? Not on file  ?Social History Narrative  ? Not on file  ? ?Social Determinants of Health  ? ?Financial Resource Strain: Not on file  ?Food Insecurity: Not on file  ?Transportation Needs: Not on file  ?Physical Activity: Not on file  ?Stress: Not on file  ?Social Connections: Not on file  ? ?Past Surgical History:  ?Procedure Laterality Date  ? ABDOMINOPLASTY    ? CHOLECYSTECTOMY    ? ?Past Surgical History:  ?Procedure Laterality Date  ? ABDOMINOPLASTY    ? CHOLECYSTECTOMY    ? ?Past Medical History:  ?Diagnosis Date  ? DEPRESSION/ANXIETY 08/08/2009  ? Diabetes mellitus without complication ([place])   ? IBS 02/02/2010  ? Neuropathy   ? Polyneuropathy 12/04/2010  ? Renal stones   ? 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 ?There were no vitals taken for this visit. ? ?Opioid Risk Score:   ?Fall Risk Score:  `1 ? ?Depression screen PHQ 2/9 ? ? ?  06/19/2021  ?  3:10  PM 05/19/2021  ?  2:48 PM 04/24/2021  ?  2:45 PM 03/22/2021  ?  3:15 PM 12/19/2020  ?  3:17 PM 10/18/2020  ?  3:32 PM 07/06/2020  ?  3:25 PM  ?Depression screen PHQ 2/9  ?Decreased Interest '1 3 1 1 '$ 0 1 1  ?Down, Depressed, Hopeless '1 3 1 1 '$ 0 1 1  ?PHQ - 2 Score '2 6 2 2 '$ 0 2 2  ? ? ?Review of Systems  ?Gastrointestinal:  Positive for rectal pain.  ?Musculoskeletal:  Positive for back pain and gait problem.  ?     Pain in both hands, lower arms, lower  upper legs, upper thighs  ?All other systems reviewed and are negative. ? ?   ?Objective:  ? Physical Exam ?Vitals and nursing note reviewed.   ?Constitutional:   ?   Appearance: Normal appearance.  ?Cardiovascular:  ?   Rate and Rhythm: Normal rate and regular rhythm.  ?   Pulses: Normal pulses.  ?   Heart sounds: Normal heart sounds.  ?Pulmonary:  ?   Effort: Pulmonary effort is normal.  ?   Breath sounds: Normal breath sounds.  ?Musculoskeletal:  ?   Cervical back: Normal range of motion and neck supple.  ?   Comments: Normal Muscle Bulk and Muscle Testing Reveals:  ?Upper Extremities: Full ROM and Muscle Strength 5/5 ?Lower Extremities: Full ROM and Muscle Strength 5/5 ?Arises from Table slowly using cane for support ?Narrow Based  Gait  ?   ?Skin: ?   General: Skin is warm and dry.  ?Neurological:  ?   Mental Status: She is alert and oriented to person, place, and time.  ?Psychiatric:     ?   Mood and Affect: Mood normal.     ?   Behavior: Behavior normal.  ? ? ? ? ?   ?Assessment & Plan:  ?1.Severe peripheral polyneuropathy of undetermined etiology.: 07/18/2021 ?Refilled: MS Contin 30 mg one tablet every 12 hours #60 and  MSIR 30 mg one tablet at HS, may take a tablet during the day as needed #60.We will continue the opioid monitoring program, this consists of regular clinic visits, examinations, urine drug screen, pill counts as well as use of [place] Controlled Substance Reporting system. A 12 month History has been reviewed on the [place] Controlled Substance Reporting System on 07/18/2021 ?2. Bilateral progressive upper extremity hand pain/ Polyneuropathy. Continue with current medication regime. Tight Glucose Control. Adhere to healthy diet regime. Continue with current medication regimen with nortriptyline: 07/18/2021 ?3. Muscle Spasms: Continue current medication regimen with  Zanaflex. Continue to Monitor.  07/18/2021 ?4. Depression/Anxiety: Continue current medication regimen with Paxil and  Klonopin. Continue to Monitor. 07/18/2021 ? 5. Bilateral Chronic Knee Pain: No complaints Today.Continue HEP as Tolerated and Continue to  Monitor. 07/18/2021. ? 6. Tachycardia: Apical pulse checked, Cardiology Following.  ? ?F/U in 1 month ?  ? ? ? ? ? ? ? ? ?

## 2021-07-19 ENCOUNTER — Telehealth: Payer: Self-pay

## 2021-07-19 NOTE — Telephone Encounter (Signed)
PA for Morphine Sulfate sent to insurance through CoverMyMeds ?

## 2021-07-20 NOTE — Telephone Encounter (Signed)
PA not needed for Morphine Sulfate per insurance ?

## 2021-07-25 ENCOUNTER — Other Ambulatory Visit: Payer: Self-pay | Admitting: Student

## 2021-07-25 DIAGNOSIS — E119 Type 2 diabetes mellitus without complications: Secondary | ICD-10-CM

## 2021-08-04 ENCOUNTER — Other Ambulatory Visit: Payer: Self-pay | Admitting: Registered Nurse

## 2021-08-16 ENCOUNTER — Encounter: Payer: Medicaid Other | Admitting: Student

## 2021-08-18 ENCOUNTER — Encounter: Payer: Medicaid Other | Attending: Physical Medicine & Rehabilitation | Admitting: Registered Nurse

## 2021-08-18 ENCOUNTER — Encounter: Payer: Self-pay | Admitting: Registered Nurse

## 2021-08-18 VITALS — BP 121/74 | HR 112 | Ht 72.0 in | Wt 281.8 lb

## 2021-08-18 DIAGNOSIS — E1140 Type 2 diabetes mellitus with diabetic neuropathy, unspecified: Secondary | ICD-10-CM | POA: Diagnosis not present

## 2021-08-18 DIAGNOSIS — R000 Tachycardia, unspecified: Secondary | ICD-10-CM

## 2021-08-18 DIAGNOSIS — Z5181 Encounter for therapeutic drug level monitoring: Secondary | ICD-10-CM

## 2021-08-18 DIAGNOSIS — G894 Chronic pain syndrome: Secondary | ICD-10-CM | POA: Diagnosis not present

## 2021-08-18 DIAGNOSIS — Z79891 Long term (current) use of opiate analgesic: Secondary | ICD-10-CM

## 2021-08-18 DIAGNOSIS — M545 Low back pain, unspecified: Secondary | ICD-10-CM

## 2021-08-18 DIAGNOSIS — G8929 Other chronic pain: Secondary | ICD-10-CM

## 2021-08-18 DIAGNOSIS — G629 Polyneuropathy, unspecified: Secondary | ICD-10-CM

## 2021-08-18 DIAGNOSIS — F341 Dysthymic disorder: Secondary | ICD-10-CM

## 2021-08-18 MED ORDER — MORPHINE SULFATE 30 MG PO TABS: ORAL_TABLET | ORAL | 0 refills | Status: DC

## 2021-08-18 MED ORDER — MORPHINE SULFATE ER 30 MG PO TBCR: 30.0000 mg | EXTENDED_RELEASE_TABLET | Freq: Two times a day (BID) | ORAL | 0 refills | Status: DC

## 2021-08-18 NOTE — Progress Notes (Unsigned)
Subjective:    Patient ID: [name], female    DOB: 1970/05/10, 51 y.o.   MRN: [id number]  HPI: [name] is a 51 y.o. female who returns for follow up appointment for chronic pain and medication refill. states *** pain is located in  ***. rates pain ***. current exercise regime is walking and performing stretching exercises.  [name] Morphine equivalent is 120.00 MME.   UDS ordered today.    Pain Inventory Average Pain 7 Pain Right Now 7 My pain is constant, sharp, burning, stabbing, tingling, and aching  In the last 24 hours, has pain interfered with the following? General activity 8 Relation with others 8 Enjoyment of life 8 What TIME of day is your pain at its worst? morning  Sleep (in general) Fair  Pain is worse with: walking, bending, standing, and some activites Pain improves with: rest and medication Relief from Meds: 7  Family History  Problem Relation Age of Onset   Fibromyalgia Mother    Aortic aneurysm Mother    Stroke Maternal Grandmother    Colon cancer Maternal Grandmother    Heart Problems Paternal Grandmother    Dementia Paternal Grandmother    Heart attack Paternal Grandfather    Social History   Socioeconomic History   Marital status: Legally Separated    Spouse name: Not on file   Number of children: 1   Years of education: Not on file   Highest education level: Bachelor's degree (e.g., BA, AB, BS)  Occupational History   Occupation: Disabled  Tobacco Use   Smoking status: Never   Smokeless tobacco: Never  Vaping Use   Vaping Use: Never used  Substance and Sexual Activity   Alcohol use: No    Alcohol/week: 0.0 standard drinks   Drug use: No    Comment: rx opiods   Sexual activity: Yes    Birth control/protection: Condom  Other Topics Concern   Not on file  Social History Narrative   Not on file   Social Determinants of Health   Financial Resource Strain: Not on file  Food Insecurity: Not on file   Transportation Needs: Not on file  Physical Activity: Not on file  Stress: Not on file  Social Connections: Not on file   Past Surgical History:  Procedure Laterality Date   ABDOMINOPLASTY     CHOLECYSTECTOMY     Past Surgical History:  Procedure Laterality Date   ABDOMINOPLASTY     CHOLECYSTECTOMY     Past Medical History:  Diagnosis Date   DEPRESSION/ANXIETY 08/08/2009   Diabetes mellitus without complication ([place])    IBS 02/02/2010   Neuropathy    Polyneuropathy 12/04/2010   Renal stones    Sleep apnea 05/2021   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       Bilateral hand and foot pain  All other systems reviewed and are negative.     Objective:   Physical Exam        Assessment & Plan:  1.Severe peripheral polyneuropathy of undetermined etiology.: 07/18/2021 Refilled: MS Contin 30 mg one tablet every 12 hours #60 and  MSIR 30 mg one tablet at HS, may take a tablet during the day as needed #60.We will continue the opioid monitoring program, this consists of regular clinic visits, examinations, urine drug screen, pill counts as well as use of [place] Controlled Substance Reporting system. A 12 month History has been reviewed on the [place] Controlled Substance Reporting System on 07/18/2021 2. Bilateral progressive upper  extremity hand pain/ Polyneuropathy. Continue with current medication regime. Tight Glucose Control. Adhere to healthy diet regime. Continue with current medication regimen with nortriptyline: 07/18/2021 3. Muscle Spasms: Continue current medication regimen with  Zanaflex. Continue to Monitor.  07/18/2021 4. Depression/Anxiety: Continue current medication regimen with Paxil and  Klonopin. Continue to Monitor. 07/18/2021  5. Bilateral Chronic Knee Pain: No complaints Today.Continue HEP as Tolerated and Continue to Monitor. 07/18/2021.  6. Tachycardia: Apical pulse checked, Cardiology Following.    F/U in 1 month

## 2021-08-21 ENCOUNTER — Other Ambulatory Visit: Payer: Self-pay | Admitting: Student

## 2021-08-21 DIAGNOSIS — E119 Type 2 diabetes mellitus without complications: Secondary | ICD-10-CM

## 2021-08-22 ENCOUNTER — Telehealth: Payer: Self-pay

## 2021-08-22 NOTE — Telephone Encounter (Signed)
PA for Morphine Sulfate MSIR sent to insurance through [employer]

## 2021-08-23 NOTE — Telephone Encounter (Signed)
We received a prior authorization request for the member and product listed above [Morphine Sulfate Immediate release Tablet '30mg'$ ]. The Community and [facility] Prior Authorization Team is not able to review this request because the requested product has been previously approved under [id number]. Based on the information reviewed, the requested prescription is currently authorized for coverage by the plan until 12/21/2021. Please resubmit this request within 30 days of authorization expiration date. Please note: The pharmacy must also enter the appropriate Clarification Code to override the rejection. The rejections may include therapeutic duplications, drug-drug interactions, and/or high dose alerts. These rejections may also take the member's recent prescription history into consideration. The pharmacy processing the claim is able to override these rejection(s) for this request. The pharmacy may obtain assistance by contacting the [employer] Help Desk at [phone]. [facility] pharmacists, please call [phone].

## 2021-08-24 LAB — TOXASSURE SELECT,+ANTIDEPR,UR

## 2021-08-25 ENCOUNTER — Telehealth: Payer: Self-pay

## 2021-08-25 ENCOUNTER — Telehealth: Payer: Self-pay | Admitting: *Deleted

## 2021-08-25 NOTE — Telephone Encounter (Signed)
Urine drug screen for this encounter is consistent for prescribed medication 

## 2021-08-25 NOTE — Telephone Encounter (Signed)
Request Reference Number: [id number]. MORPHINE SUL TAB '30MG'$  ER is approved through 11/25/2021. For further questions, call [employer] at [phone].

## 2021-08-25 NOTE — Telephone Encounter (Signed)
PA for Morphine Sulfate ER sent to insurance through CoverMyMeds 

## 2021-08-29 ENCOUNTER — Other Ambulatory Visit (HOSPITAL_COMMUNITY): Payer: Self-pay

## 2021-08-29 ENCOUNTER — Telehealth: Payer: Self-pay

## 2021-08-29 ENCOUNTER — Encounter: Payer: Self-pay | Admitting: *Deleted

## 2021-08-29 MED ORDER — MORPHINE SULFATE ER 30 MG PO TBCR
30.0000 mg | EXTENDED_RELEASE_TABLET | Freq: Two times a day (BID) | ORAL | 0 refills | Status: DC
  Filled 2021-08-29: qty 60, 30d supply, fill #0

## 2021-08-29 NOTE — Telephone Encounter (Signed)
Patient's Morphine ER needs to be sent to [facility]. Medication cancelled at [facility]

## 2021-08-29 NOTE — Telephone Encounter (Signed)
PMP was Reviewed.  MS Contin e- scribed to Pharmacy.  [name] is aware via My Chart Message

## 2021-08-30 ENCOUNTER — Encounter: Payer: Medicaid Other | Admitting: Student

## 2021-09-07 ENCOUNTER — Encounter: Payer: Self-pay | Admitting: Student

## 2021-09-07 ENCOUNTER — Other Ambulatory Visit: Payer: Self-pay

## 2021-09-07 DIAGNOSIS — E118 Type 2 diabetes mellitus with unspecified complications: Secondary | ICD-10-CM

## 2021-09-07 MED ORDER — METFORMIN HCL 1000 MG PO TABS: ORAL_TABLET | ORAL | 2 refills | Status: DC

## 2021-09-08 LAB — HM DIABETES EYE EXAM

## 2021-09-13 ENCOUNTER — Other Ambulatory Visit: Payer: Self-pay | Admitting: Student

## 2021-09-13 ENCOUNTER — Encounter: Payer: Medicaid Other | Admitting: Student

## 2021-09-13 DIAGNOSIS — E119 Type 2 diabetes mellitus without complications: Secondary | ICD-10-CM

## 2021-09-13 NOTE — Progress Notes (Deleted)
    SUBJECTIVE:   Chief compliant/HPI: annual examination  [name] is a 51 y.o. who presents today for an annual exam.    History tabs reviewed and updated ***.   Review of systems form reviewed and notable for ***.   OBJECTIVE:   There were no vitals taken for this visit.  ***  ASSESSMENT/PLAN:   No problem-specific Assessment & Plan notes found for this encounter.    Annual Examination  See AVS for age appropriate recommendations  PHQ score ***, reviewed and discussed.  BP reviewed and at goal ***.  Asked about intimate partner violence and resources given as appropriate  Advance directives discussion ***  Considered the following items based upon USPSTF recommendations: Diabetes screening: {discussed/ordered:14545} Screening for elevated cholesterol: {discussed/ordered:14545} HIV testing:  Neg in 2020 Hepatitis C:  Neg in 2017 Hepatitis B:  Neg in 2017  Osteoporosis screening considered based upon risk of fracture from [facility] calculator. Major osteoporotic fracture risk is ***%. DEXA {ordered not order:23822}.  Reviewed risk factors for latent tuberculosis and {not indicated/requested/declined:14582}   Discussed family history, BRCA testing {not indicated/requested/declined:14582}. Tool used to risk stratify was ***.  Cervical cancer screening: {PAPTYPE:23819} Breast cancer screening: {mammoscreen:23820} Colorectal cancer screening: {crcscreen:23821::"discussed, colonoscopy ordered"} Lung cancer screening: {discussed/declined/written info:19698}. See documentation below regarding indications/risks/benefits.  Vaccinations ***.   Follow up in 1 *** year or sooner if indicated.    [name], [place]

## 2021-09-15 ENCOUNTER — Encounter: Payer: Medicaid Other | Attending: Physical Medicine & Rehabilitation | Admitting: Registered Nurse

## 2021-09-15 ENCOUNTER — Encounter: Payer: Self-pay | Admitting: Registered Nurse

## 2021-09-15 ENCOUNTER — Other Ambulatory Visit (HOSPITAL_COMMUNITY): Payer: Self-pay

## 2021-09-15 VITALS — BP 112/66 | HR 120 | Temp 98.6°F | Ht 72.0 in | Wt 279.4 lb

## 2021-09-15 DIAGNOSIS — Z5181 Encounter for therapeutic drug level monitoring: Secondary | ICD-10-CM | POA: Insufficient documentation

## 2021-09-15 DIAGNOSIS — Z79891 Long term (current) use of opiate analgesic: Secondary | ICD-10-CM | POA: Insufficient documentation

## 2021-09-15 DIAGNOSIS — G629 Polyneuropathy, unspecified: Secondary | ICD-10-CM | POA: Insufficient documentation

## 2021-09-15 DIAGNOSIS — G894 Chronic pain syndrome: Secondary | ICD-10-CM | POA: Insufficient documentation

## 2021-09-15 DIAGNOSIS — R000 Tachycardia, unspecified: Secondary | ICD-10-CM | POA: Insufficient documentation

## 2021-09-15 DIAGNOSIS — F341 Dysthymic disorder: Secondary | ICD-10-CM | POA: Insufficient documentation

## 2021-09-15 DIAGNOSIS — E1140 Type 2 diabetes mellitus with diabetic neuropathy, unspecified: Secondary | ICD-10-CM | POA: Insufficient documentation

## 2021-09-15 MED ORDER — MORPHINE SULFATE 30 MG PO TABS: ORAL_TABLET | ORAL | 0 refills | Status: DC

## 2021-09-15 MED ORDER — MORPHINE SULFATE ER 30 MG PO TBCR
30.0000 mg | EXTENDED_RELEASE_TABLET | Freq: Two times a day (BID) | ORAL | 0 refills | Status: DC
  Filled 2021-09-15 – 2021-09-28 (×2): qty 60, 30d supply, fill #0

## 2021-09-19 ENCOUNTER — Telehealth: Payer: Self-pay

## 2021-09-20 NOTE — Telephone Encounter (Signed)
[place] sent a fax stating the medication has already been approved until 12/21/21

## 2021-09-28 ENCOUNTER — Other Ambulatory Visit (HOSPITAL_COMMUNITY): Payer: Self-pay

## 2021-10-06 ENCOUNTER — Other Ambulatory Visit: Payer: Self-pay | Admitting: Student

## 2021-10-06 DIAGNOSIS — E119 Type 2 diabetes mellitus without complications: Secondary | ICD-10-CM

## 2021-10-12 ENCOUNTER — Other Ambulatory Visit: Payer: Self-pay | Admitting: Student

## 2021-10-16 ENCOUNTER — Encounter: Payer: Medicaid Other | Admitting: Student

## 2021-10-17 ENCOUNTER — Other Ambulatory Visit (HOSPITAL_COMMUNITY): Payer: Self-pay

## 2021-10-17 ENCOUNTER — Encounter: Payer: Medicaid Other | Attending: Physical Medicine & Rehabilitation | Admitting: Registered Nurse

## 2021-10-17 ENCOUNTER — Telehealth: Payer: Self-pay

## 2021-10-17 VITALS — BP 115/62 | HR 105 | Ht 72.0 in | Wt 277.4 lb

## 2021-10-17 DIAGNOSIS — G894 Chronic pain syndrome: Secondary | ICD-10-CM | POA: Diagnosis not present

## 2021-10-17 DIAGNOSIS — R000 Tachycardia, unspecified: Secondary | ICD-10-CM | POA: Diagnosis not present

## 2021-10-17 DIAGNOSIS — Z79891 Long term (current) use of opiate analgesic: Secondary | ICD-10-CM

## 2021-10-17 DIAGNOSIS — E1140 Type 2 diabetes mellitus with diabetic neuropathy, unspecified: Secondary | ICD-10-CM | POA: Insufficient documentation

## 2021-10-17 DIAGNOSIS — Z5181 Encounter for therapeutic drug level monitoring: Secondary | ICD-10-CM

## 2021-10-17 DIAGNOSIS — F341 Dysthymic disorder: Secondary | ICD-10-CM | POA: Insufficient documentation

## 2021-10-17 MED ORDER — MORPHINE SULFATE 30 MG PO TABS: ORAL_TABLET | ORAL | 0 refills | Status: DC

## 2021-10-17 MED ORDER — MORPHINE SULFATE ER 30 MG PO TBCR
30.0000 mg | EXTENDED_RELEASE_TABLET | Freq: Two times a day (BID) | ORAL | 0 refills | Status: DC
  Filled 2021-10-17 – 2021-10-26 (×2): qty 60, 30d supply, fill #0

## 2021-10-17 NOTE — Progress Notes (Signed)
Subjective:    Patient ID: [name], female    DOB: [birth year], 51 y.o.   MRN: [id number]  HPI: [name] is a 51 y.o. female who returns for follow up appointment for chronic pain and medication refill. She states her pain is located in  her bilateral hands and bilateral feet with tingling and numbness. She rates her pain 7. Her current exercise regime is walking and performing stretching exercises.  [name] Morphine equivalent is 120.00 MME.  She  is also prescribed Clonazepam .We have discussed the black box warning of using opioids and benzodiazepines. I highlighted the dangers of using these drugs together and discussed the adverse events including respiratory suppression, overdose, cognitive impairment and importance of compliance with current regimen. We will continue to monitor and adjust as indicated.   Last UDS was Performed on 08/18/2021, it was consistent.      Pain Inventory Average Pain 7 Pain Right Now 7 My pain is constant, sharp, burning, dull, stabbing, tingling, and aching  In the last 24 hours, has pain interfered with the following? General activity 8 Relation with others 8 Enjoyment of life 8 What TIME of day is your pain at its worst? morning  and daytime Sleep (in general) Poor  Pain is worse with: walking, bending, standing, and some activites Pain improves with: rest and medication Relief from Meds: 7  Family History  Problem Relation Age of Onset   Fibromyalgia Mother    Aortic aneurysm Mother    Stroke Maternal Grandmother    Colon cancer Maternal Grandmother    Heart Problems Paternal Grandmother    Dementia Paternal Grandmother    Heart attack Paternal Grandfather    Social History   Socioeconomic History   Marital status: Legally Separated    Spouse name: Not on file   Number of children: 1   Years of education: Not on file   Highest education level: Bachelor's degree (e.g., BA, AB, BS)  Occupational History    Occupation: Disabled  Tobacco Use   Smoking status: Never   Smokeless tobacco: Never  Vaping Use   Vaping Use: Never used  Substance and Sexual Activity   Alcohol use: No    Alcohol/week: 0.0 standard drinks of alcohol   Drug use: No    Comment: rx opiods   Sexual activity: Yes    Birth control/protection: Condom  Other Topics Concern   Not on file  Social History Narrative   Not on file   Social Determinants of Health   Financial Resource Strain: Low Risk  (05/09/2018)   Overall Financial Resource Strain (CARDIA)    Difficulty of Paying Living Expenses: Not very hard  Food Insecurity: No Food Insecurity (05/09/2018)   Hunger Vital Sign    Worried About Running Out of Food in the Last Year: Never true    Ran Out of Food in the Last Year: Never true  Transportation Needs: No Transportation Needs (05/09/2018)   PRAPARE - Hydrologist (Medical): No    Lack of Transportation (Non-Medical): No  Physical Activity: Inactive (05/09/2018)   Exercise Vital Sign    Days of Exercise per Week: 0 days    Minutes of Exercise per Session: 0 min  Stress: No Stress Concern Present (05/09/2018)   [place]    Feeling of Stress : Only a little  Social Connections: Unknown (05/09/2018)   Social Connection and Isolation Panel [NHANES]  Frequency of Communication with Friends and Family: Once a week    Frequency of Social Gatherings with Friends and Family: Once a week    Attends Religious Services: Patient refused    Marine scientist or Organizations: No    Attends Music therapist: Never    Marital Status: Separated   Past Surgical History:  Procedure Laterality Date   ABDOMINOPLASTY     CHOLECYSTECTOMY     Past Surgical History:  Procedure Laterality Date   ABDOMINOPLASTY     CHOLECYSTECTOMY     Past Medical History:  Diagnosis Date   DEPRESSION/ANXIETY 08/08/2009    Diabetes mellitus without complication ([place])    IBS 02/02/2010   Neuropathy    Polyneuropathy 12/04/2010   Renal stones    Sleep apnea 05/2021   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       Bilateral lower arm pain Bilateral lower leg pain  All other systems reviewed and are negative.     Objective:   Physical Exam Vitals and nursing note reviewed.  Constitutional:      Appearance: Normal appearance.  Cardiovascular:     Rate and Rhythm: Normal rate and regular rhythm.     Pulses: Normal pulses.     Heart sounds: Normal heart sounds.  Pulmonary:     Effort: Pulmonary effort is normal.     Breath sounds: Normal breath sounds.  Musculoskeletal:     Cervical back: Normal range of motion and neck supple.     Comments: Normal Muscle Bulk and Muscle Testing Reveals:  Upper Extremities: Full ROM and Muscle Strength 5/5 Lower Extremities: Full ROM and Muscle Strength 5/5 Arises from Table Slowly using cane for support Narrow Based  Gait     Skin:    General: Skin is warm and dry.  Neurological:     Mental Status: She is alert and oriented to person, place, and time.  Psychiatric:        Mood and Affect: Mood normal.        Behavior: Behavior normal.         Assessment & Plan:  1.Severe peripheral polyneuropathy of undetermined etiology.:  10/17/2021 Refilled: MS Contin 30 mg one tablet every 12 hours #60 and  MSIR 30 mg one tablet at HS, may take a tablet during the day as needed #60.We will continue the opioid monitoring program, this consists of regular clinic visits, examinations, urine drug screen, pill counts as well as use of [place] Controlled Substance Reporting system. A 12 month History has been reviewed on the [place] Controlled Substance Reporting System on 10/17/2021 2. Bilateral progressive upper extremity hand pain/ Polyneuropathy. Continue with current medication regime. Tight Glucose Control. Adhere to healthy diet regime. Continue with current medication regimen with nortriptyline: 10/17/2021 3. Muscle Spasms: Continue current medication regimen with  Zanaflex. Continue to Monitor.  10/17/2021 4. Depression/Anxiety: Continue current medication regimen with Paxil and  Klonopin. Continue to Monitor. 10/17/2021  5. Bilateral Chronic Knee Pain: No complaints Today.Continue HEP as Tolerated and Continue to Monitor. 07/252023.  6. Tachycardia: Apical pulse checked, Cardiology Following. 10/17/2021   F/U in 1 month

## 2021-10-17 NOTE — Telephone Encounter (Signed)
PA for MSIR 30 mg sent to insurance through [facility]

## 2021-10-18 NOTE — Telephone Encounter (Signed)
Received a fax from insurance that medication is approved through 12/21/21

## 2021-10-25 ENCOUNTER — Other Ambulatory Visit (HOSPITAL_COMMUNITY): Payer: Self-pay

## 2021-10-26 ENCOUNTER — Other Ambulatory Visit (HOSPITAL_COMMUNITY): Payer: Self-pay

## 2021-10-26 ENCOUNTER — Encounter: Payer: Self-pay | Admitting: Registered Nurse

## 2021-11-03 ENCOUNTER — Other Ambulatory Visit: Payer: Self-pay | Admitting: Student

## 2021-11-03 DIAGNOSIS — E119 Type 2 diabetes mellitus without complications: Secondary | ICD-10-CM

## 2021-11-15 ENCOUNTER — Encounter: Payer: Medicaid Other | Admitting: Registered Nurse

## 2021-11-15 ENCOUNTER — Telehealth: Payer: Self-pay | Admitting: Registered Nurse

## 2021-11-15 MED ORDER — MORPHINE SULFATE 30 MG PO TABS: ORAL_TABLET | ORAL | 0 refills | Status: DC

## 2021-11-15 NOTE — Telephone Encounter (Signed)
PMP was Reviewed.  MSIR e-scribed today.  [name] was sent a My-Chart message regarding the above.

## 2021-11-16 ENCOUNTER — Other Ambulatory Visit: Payer: Self-pay | Admitting: Student

## 2021-11-16 DIAGNOSIS — E781 Pure hyperglyceridemia: Secondary | ICD-10-CM

## 2021-11-21 ENCOUNTER — Encounter: Payer: Medicaid Other | Admitting: Student

## 2021-11-22 ENCOUNTER — Other Ambulatory Visit (HOSPITAL_COMMUNITY): Payer: Self-pay

## 2021-11-22 ENCOUNTER — Encounter: Payer: Medicaid Other | Attending: Physical Medicine & Rehabilitation | Admitting: Registered Nurse

## 2021-11-22 ENCOUNTER — Encounter: Payer: Self-pay | Admitting: Registered Nurse

## 2021-11-22 VITALS — BP 104/68 | HR 98 | Ht 72.0 in | Wt 272.6 lb

## 2021-11-22 DIAGNOSIS — F341 Dysthymic disorder: Secondary | ICD-10-CM | POA: Diagnosis not present

## 2021-11-22 DIAGNOSIS — Z79891 Long term (current) use of opiate analgesic: Secondary | ICD-10-CM | POA: Diagnosis not present

## 2021-11-22 DIAGNOSIS — E1140 Type 2 diabetes mellitus with diabetic neuropathy, unspecified: Secondary | ICD-10-CM | POA: Insufficient documentation

## 2021-11-22 DIAGNOSIS — M62838 Other muscle spasm: Secondary | ICD-10-CM | POA: Insufficient documentation

## 2021-11-22 DIAGNOSIS — Z5181 Encounter for therapeutic drug level monitoring: Secondary | ICD-10-CM | POA: Insufficient documentation

## 2021-11-22 DIAGNOSIS — G894 Chronic pain syndrome: Secondary | ICD-10-CM | POA: Diagnosis not present

## 2021-11-22 MED ORDER — MORPHINE SULFATE 30 MG PO TABS: ORAL_TABLET | ORAL | 0 refills | Status: DC

## 2021-11-22 MED ORDER — CLONAZEPAM 0.5 MG PO TABS: ORAL_TABLET | ORAL | 3 refills | Status: DC

## 2021-11-22 MED ORDER — MORPHINE SULFATE ER 30 MG PO TBCR
30.0000 mg | EXTENDED_RELEASE_TABLET | Freq: Two times a day (BID) | ORAL | 0 refills | Status: DC
  Filled 2021-11-22 – 2021-11-28 (×3): qty 60, 30d supply, fill #0

## 2021-11-22 NOTE — Progress Notes (Signed)
Subjective:    Patient ID: SUAN PYEATT, female    DOB: [birth year], 51 y.o.   MRN: [id number]  HPI: [name] is a 51 y.o. female who returns for follow up appointment for chronic pain and medication refill. She states her pain is located in her bilateral hands and bilateral feet with tingling and burning. She rates her pain 6. Her current exercise regime is walking and performing stretching exercises.  [name] Morphine equivalent is 120.00 MME. She is also prescribed Clonazepam .We have discussed the black box warning of using opioids and benzodiazepines. I highlighted the dangers of using these drugs together and discussed the adverse events including respiratory suppression, overdose, cognitive impairment and importance of compliance with current regimen. We will continue to monitor and adjust as indicated.    UDS ordered today.     Pain Inventory Average Pain 6 Pain Right Now 6 My pain is constant, sharp, burning, dull, stabbing, tingling, and aching  In the last 24 hours, has pain interfered with the following? General activity 8 Relation with others 8 Enjoyment of life 8 What TIME of day is your pain at its worst? morning  and evening Sleep (in general) Fair  Pain is worse with: walking, bending, and standing Pain improves with: rest and medication Relief from Meds: 7  Family History  Problem Relation Age of Onset   Fibromyalgia Mother    Aortic aneurysm Mother    Stroke Maternal Grandmother    Colon cancer Maternal Grandmother    Heart Problems Paternal Grandmother    Dementia Paternal Grandmother    Heart attack Paternal Grandfather    Social History   Socioeconomic History   Marital status: Legally Separated    Spouse name: Not on file   Number of children: 1   Years of education: Not on file   Highest education level: Bachelor's degree (e.g., BA, AB, BS)  Occupational History   Occupation: Disabled  Tobacco Use   Smoking status:  Never   Smokeless tobacco: Never  Vaping Use   Vaping Use: Never used  Substance and Sexual Activity   Alcohol use: No    Alcohol/week: 0.0 standard drinks of alcohol   Drug use: No    Comment: rx opiods   Sexual activity: Yes    Birth control/protection: Condom  Other Topics Concern   Not on file  Social History Narrative   Not on file   Social Determinants of Health   Financial Resource Strain: Low Risk  (05/09/2018)   Overall Financial Resource Strain (CARDIA)    Difficulty of Paying Living Expenses: Not very hard  Food Insecurity: No Food Insecurity (05/09/2018)   Hunger Vital Sign    Worried About Running Out of Food in the Last Year: Never true    Ran Out of Food in the Last Year: Never true  Transportation Needs: No Transportation Needs (05/09/2018)   PRAPARE - Hydrologist (Medical): No    Lack of Transportation (Non-Medical): No  Physical Activity: Inactive (05/09/2018)   Exercise Vital Sign    Days of Exercise per Week: 0 days    Minutes of Exercise per Session: 0 min  Stress: No Stress Concern Present (05/09/2018)   [place]    Feeling of Stress : Only a little  Social Connections: Unknown (05/09/2018)   Social Connection and Isolation Panel [NHANES]    Frequency of Communication with Friends and Family: Once a  week    Frequency of Social Gatherings with Friends and Family: Once a week    Attends Religious Services: Patient refused    Marine scientist or Organizations: No    Attends Music therapist: Never    Marital Status: Separated   Past Surgical History:  Procedure Laterality Date   ABDOMINOPLASTY     CHOLECYSTECTOMY     Past Surgical History:  Procedure Laterality Date   ABDOMINOPLASTY     CHOLECYSTECTOMY     Past Medical History:  Diagnosis Date   DEPRESSION/ANXIETY 08/08/2009   Diabetes mellitus without complication ([place])    IBS  02/02/2010   Neuropathy    Polyneuropathy 12/04/2010   Renal stones    Sleep apnea 05/2021   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) 08/08/2009   BP 104/68   Pulse 98   Ht 1' (0.305 m)   Wt 272 lb 9.6 oz (123.7 kg)   SpO2 99%   BMI 1330.96 kg/m   Opioid Risk Score:   Fall Risk Score:  `1  Depression screen [facility] 2/9     07/18/2021    3:23 PM 06/19/2021    3:10 PM 05/19/2021    2:48 PM 04/24/2021    2:45 PM 03/22/2021    3:15 PM 12/19/2020    3:17 PM 10/18/2020    3:32 PM  Depression screen PHQ 2/9  Decreased Interest 0 '1 3 1 1 '$ 0 1  Down, Depressed, Hopeless 0 '1 3 1 1 '$ 0 1  PHQ - 2 Score 0 '2 6 2 2 '$ 0 2     Review of Systems  Musculoskeletal:        Bilateral hand pain Bilateral foot pain  All other systems reviewed and are negative.     Objective:   Physical Exam Vitals and nursing note reviewed.  Constitutional:      Appearance: Normal appearance.  Cardiovascular:     Rate and Rhythm: Normal rate and regular rhythm.     Pulses: Normal pulses.     Heart sounds: Normal heart sounds.  Pulmonary:     Effort: Pulmonary effort is normal.     Breath sounds: Normal breath sounds.  Musculoskeletal:     Cervical back: Normal range of motion and neck supple.     Comments: Normal Muscle Bulk and Muscle Testing Reveals:  Upper Extremities: Full ROM and Muscle Strength 5/5 Lower Extremities: Full ROM and Muscle Strength 5/5  Arises from the Table with Ease  Narrow Based Gait     Skin:    General: Skin is warm and dry.  Neurological:     Mental Status: She is alert and oriented to person, place, and time.  Psychiatric:        Mood and Affect: Mood normal.        Behavior: Behavior normal.         Assessment & Plan:  1.Severe peripheral polyneuropathy of undetermined etiology.: 11/22/2021 Refilled: MS Contin 30 mg one tablet every 12 hours #60 and  MSIR 30 mg one tablet at HS, may take a  tablet during the day as needed #60.We will continue the opioid monitoring program, this consists of regular clinic visits, examinations, urine drug screen, pill counts as well as use of [place] Controlled Substance Reporting system. A 12 month History has been reviewed on the [place] Controlled Substance Reporting System on 11/22/2021 2. Bilateral progressive upper extremity hand pain/ Polyneuropathy. Continue with  current medication regime. Tight Glucose Control. Adhere to healthy diet regime. Continue with current medication regimen with nortriptyline: 11/22/2021 3. Muscle Spasms: Continue current medication regimen with  Zanaflex. Continue to Monitor.  11/22/2021 4. Depression/Anxiety: Continue current medication regimen with Paxil and  Klonopin. Continue to Monitor. 11/22/2021  5. Bilateral Chronic Knee Pain: No complaints Today.Continue HEP as Tolerated and Continue to Monitor. 08/302023.    F/U in 1 month

## 2021-11-25 LAB — TOXASSURE SELECT,+ANTIDEPR,UR

## 2021-11-27 ENCOUNTER — Other Ambulatory Visit: Payer: Self-pay | Admitting: Student

## 2021-11-27 DIAGNOSIS — E119 Type 2 diabetes mellitus without complications: Secondary | ICD-10-CM

## 2021-11-28 ENCOUNTER — Telehealth: Payer: Self-pay

## 2021-11-28 ENCOUNTER — Other Ambulatory Visit (HOSPITAL_COMMUNITY): Payer: Self-pay

## 2021-11-28 ENCOUNTER — Telehealth: Payer: Self-pay | Admitting: *Deleted

## 2021-11-28 NOTE — Telephone Encounter (Signed)
PA for Morphine ER 30 mg sent to insurance through [facility]

## 2021-11-28 NOTE — Telephone Encounter (Signed)
Urine drug screen for this encounter is consistent for prescribed medication 

## 2021-11-28 NOTE — Telephone Encounter (Signed)
Request Reference Number: [id number]. MORPHINE SUL TAB '30MG'$  ER is approved through 02/27/2022. For further questions, call [employer] at [phone].

## 2021-11-29 ENCOUNTER — Encounter: Payer: Self-pay | Admitting: Student

## 2021-12-01 ENCOUNTER — Other Ambulatory Visit (HOSPITAL_COMMUNITY): Payer: Self-pay

## 2021-12-05 ENCOUNTER — Encounter: Payer: Medicaid Other | Admitting: Student

## 2021-12-05 ENCOUNTER — Other Ambulatory Visit: Payer: Self-pay | Admitting: Student

## 2021-12-11 ENCOUNTER — Other Ambulatory Visit: Payer: Self-pay | Admitting: Physical Medicine and Rehabilitation

## 2021-12-11 MED ORDER — MORPHINE SULFATE 30 MG PO TABS: ORAL_TABLET | ORAL | 0 refills | Status: DC

## 2021-12-20 ENCOUNTER — Encounter: Payer: Medicaid Other | Admitting: Registered Nurse

## 2021-12-22 ENCOUNTER — Other Ambulatory Visit: Payer: Self-pay | Admitting: Student

## 2021-12-22 DIAGNOSIS — E119 Type 2 diabetes mellitus without complications: Secondary | ICD-10-CM

## 2021-12-25 ENCOUNTER — Encounter: Payer: Self-pay | Admitting: Student

## 2021-12-25 ENCOUNTER — Other Ambulatory Visit: Payer: Self-pay | Admitting: Student

## 2021-12-25 DIAGNOSIS — E119 Type 2 diabetes mellitus without complications: Secondary | ICD-10-CM

## 2021-12-26 ENCOUNTER — Telehealth: Payer: Self-pay | Admitting: Registered Nurse

## 2021-12-26 ENCOUNTER — Other Ambulatory Visit (HOSPITAL_COMMUNITY): Payer: Self-pay

## 2021-12-26 ENCOUNTER — Other Ambulatory Visit: Payer: Self-pay | Admitting: *Deleted

## 2021-12-26 DIAGNOSIS — E119 Type 2 diabetes mellitus without complications: Secondary | ICD-10-CM

## 2021-12-26 MED ORDER — MORPHINE SULFATE ER 30 MG PO TBCR
30.0000 mg | EXTENDED_RELEASE_TABLET | Freq: Two times a day (BID) | ORAL | 0 refills | Status: DC
  Filled 2021-12-26: qty 60, 30d supply, fill #0

## 2021-12-26 MED ORDER — VICTOZA 18 MG/3ML ~~LOC~~ SOPN: PEN_INJECTOR | SUBCUTANEOUS | 0 refills | Status: DC

## 2021-12-26 NOTE — Telephone Encounter (Signed)
PMP was Reviewed.  MS Contin e-scribed today.  [name] is aware of the above via My Chart message.

## 2021-12-29 ENCOUNTER — Encounter: Payer: Medicaid Other | Admitting: Student

## 2022-01-01 ENCOUNTER — Other Ambulatory Visit (HOSPITAL_COMMUNITY): Payer: Self-pay

## 2022-01-01 ENCOUNTER — Telehealth: Payer: Self-pay | Admitting: Registered Nurse

## 2022-01-01 DIAGNOSIS — F341 Dysthymic disorder: Secondary | ICD-10-CM

## 2022-01-01 MED ORDER — CLONAZEPAM 0.5 MG PO TABS
1.5000 mg | ORAL_TABLET | Freq: Every evening | ORAL | 3 refills | Status: DC | PRN
  Filled 2022-01-01: qty 90, 30d supply, fill #0
  Filled 2022-01-30: qty 90, 30d supply, fill #1
  Filled 2022-02-27: qty 90, 30d supply, fill #2

## 2022-01-01 NOTE — Telephone Encounter (Signed)
PMP was Reviewed,  Clonazepam e-scribed to [facility].  Ms. Perot is aware via My-Chart message.

## 2022-01-02 ENCOUNTER — Encounter: Payer: Self-pay | Admitting: Registered Nurse

## 2022-01-02 ENCOUNTER — Encounter: Payer: Medicaid Other | Attending: Physical Medicine & Rehabilitation | Admitting: Registered Nurse

## 2022-01-02 ENCOUNTER — Other Ambulatory Visit (HOSPITAL_COMMUNITY): Payer: Self-pay

## 2022-01-02 VITALS — BP 130/83 | HR 104 | Ht 72.0 in | Wt 276.0 lb

## 2022-01-02 DIAGNOSIS — G894 Chronic pain syndrome: Secondary | ICD-10-CM | POA: Diagnosis not present

## 2022-01-02 DIAGNOSIS — R000 Tachycardia, unspecified: Secondary | ICD-10-CM | POA: Diagnosis not present

## 2022-01-02 DIAGNOSIS — G629 Polyneuropathy, unspecified: Secondary | ICD-10-CM | POA: Diagnosis not present

## 2022-01-02 DIAGNOSIS — Z5181 Encounter for therapeutic drug level monitoring: Secondary | ICD-10-CM | POA: Insufficient documentation

## 2022-01-02 DIAGNOSIS — M62838 Other muscle spasm: Secondary | ICD-10-CM | POA: Insufficient documentation

## 2022-01-02 DIAGNOSIS — E1140 Type 2 diabetes mellitus with diabetic neuropathy, unspecified: Secondary | ICD-10-CM | POA: Insufficient documentation

## 2022-01-02 DIAGNOSIS — F341 Dysthymic disorder: Secondary | ICD-10-CM | POA: Insufficient documentation

## 2022-01-02 DIAGNOSIS — Z79891 Long term (current) use of opiate analgesic: Secondary | ICD-10-CM | POA: Diagnosis not present

## 2022-01-02 MED ORDER — MORPHINE SULFATE 30 MG PO TABS: ORAL_TABLET | ORAL | 0 refills | Status: DC

## 2022-01-02 MED ORDER — MORPHINE SULFATE ER 30 MG PO TBCR
30.0000 mg | EXTENDED_RELEASE_TABLET | Freq: Two times a day (BID) | ORAL | 0 refills | Status: DC
  Filled 2022-01-02 – 2022-01-25 (×2): qty 60, 30d supply, fill #0

## 2022-01-02 NOTE — Progress Notes (Unsigned)
Subjective:    Patient ID: [name], female    DOB: August 26, 1970, 50 y.o.   MRN: [id number]  HPI: [name] is a 51 y.o. female who returns for follow up appointment for chronic pain and medication refill. states *** pain is located in  ***. rates pain ***. current exercise regime is walking and performing stretching exercises.  [name] Morphine equivalent is *** MME.   she is also prescribed *** by Dr. Marland Kitchen .We have discussed the black box warning of using opioids and benzodiazepines. I highlighted the dangers of using these drugs together and discussed the adverse events including respiratory suppression, overdose, cognitive impairment and importance of compliance with current regimen. We will continue to monitor and adjust as indicated.  she is being closely monitored and under the care of her psychiatrist________. she verbalizes understanding.    Last UDS was Performed on 11/22/2021, it was consistent.     Pain Inventory Average Pain 7 Pain Right Now 7 My pain is intermittent, constant, sharp, burning, dull, stabbing, tingling, and aching  In the last 24 hours, has pain interfered with the following? General activity 8 Relation with others 8 Enjoyment of life 8 What TIME of day is your pain at its worst? evening Sleep (in general) Poor  Pain is worse with: walking, bending, standing, and some activites Pain improves with: rest and medication Relief from Meds: 8  Family History  Problem Relation Age of Onset   Fibromyalgia Mother    Aortic aneurysm Mother    Stroke Maternal Grandmother    Colon cancer Maternal Grandmother    Heart Problems Paternal Grandmother    Dementia Paternal Grandmother    Heart attack Paternal Grandfather    Social History   Socioeconomic History   Marital status: Legally Separated    Spouse name: Not on file   Number of children: 1   Years of education: Not on file   Highest education level: Bachelor's degree (e.g.,  BA, AB, BS)  Occupational History   Occupation: Disabled  Tobacco Use   Smoking status: Never   Smokeless tobacco: Never  Vaping Use   Vaping Use: Never used  Substance and Sexual Activity   Alcohol use: No    Alcohol/week: 0.0 standard drinks of alcohol   Drug use: No    Comment: rx opiods   Sexual activity: Yes    Birth control/protection: Condom  Other Topics Concern   Not on file  Social History Narrative   Not on file   Social Determinants of Health   Financial Resource Strain: Low Risk  (05/09/2018)   Overall Financial Resource Strain (CARDIA)    Difficulty of Paying Living Expenses: Not very hard  Food Insecurity: No Food Insecurity (05/09/2018)   Hunger Vital Sign    Worried About Running Out of Food in the Last Year: Never true    Ran Out of Food in the Last Year: Never true  Transportation Needs: No Transportation Needs (05/09/2018)   PRAPARE - Hydrologist (Medical): No    Lack of Transportation (Non-Medical): No  Physical Activity: Inactive (05/09/2018)   Exercise Vital Sign    Days of Exercise per Week: 0 days    Minutes of Exercise per Session: 0 min  Stress: No Stress Concern Present (05/09/2018)   [place]    Feeling of Stress : Only a little  Social Connections: Unknown (05/09/2018)   Social Connection and  Isolation Panel [NHANES]    Frequency of Communication with Friends and Family: Once a week    Frequency of Social Gatherings with Friends and Family: Once a week    Attends Religious Services: Patient refused    Marine scientist or Organizations: No    Attends Music therapist: Never    Marital Status: Separated   Past Surgical History:  Procedure Laterality Date   ABDOMINOPLASTY     CHOLECYSTECTOMY     Past Surgical History:  Procedure Laterality Date   ABDOMINOPLASTY     CHOLECYSTECTOMY     Past Medical History:  Diagnosis Date    DEPRESSION/ANXIETY 08/08/2009   Diabetes mellitus without complication ([place])    IBS 02/02/2010   Neuropathy    Polyneuropathy 12/04/2010   Renal stones    Sleep apnea 05/2021   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) 08/08/2009   BP 130/83   Pulse (!) 101   Ht 6' (1.829 m)   Wt 276 lb (125.2 kg)   SpO2 99%   BMI 37.43 kg/m   Opioid Risk Score:   Fall Risk Score:  `1  Depression screen [facility] 2/9     07/18/2021    3:23 PM 06/19/2021    3:10 PM 05/19/2021    2:48 PM 04/24/2021    2:45 PM 03/22/2021    3:15 PM 12/19/2020    3:17 PM 10/18/2020    3:32 PM  Depression screen PHQ 2/9  Decreased Interest 0 '1 3 1 1 '$ 0 1  Down, Depressed, Hopeless 0 '1 3 1 1 '$ 0 1  PHQ - 2 Score 0 '2 6 2 2 '$ 0 2     Review of Systems  Musculoskeletal:        Bilateral hand and foot pain  All other systems reviewed and are negative.     Objective:   Physical Exam        Assessment & Plan:

## 2022-01-05 ENCOUNTER — Encounter: Payer: Medicaid Other | Admitting: Student

## 2022-01-09 ENCOUNTER — Telehealth: Payer: Self-pay | Admitting: *Deleted

## 2022-01-09 NOTE — Telephone Encounter (Signed)
Prior auth for Morphine Sulfate IR submitted to [employer] via [employer] [facility].

## 2022-01-09 NOTE — Telephone Encounter (Signed)
Prior auth submitted to [employer] via [employer] [facility].

## 2022-01-10 NOTE — Telephone Encounter (Signed)
Approved through 07/11/2022.  Pharmacy and Magda Paganini notified.

## 2022-01-11 ENCOUNTER — Other Ambulatory Visit: Payer: Self-pay | Admitting: Registered Nurse

## 2022-01-15 ENCOUNTER — Other Ambulatory Visit (HOSPITAL_COMMUNITY)
Admission: RE | Admit: 2022-01-15 | Discharge: 2022-01-15 | Disposition: A | Discharge status: Home / Self Care | Payer: Medicaid Other | Source: Ambulatory Visit | Attending: Family Medicine | Admitting: Family Medicine

## 2022-01-15 ENCOUNTER — Encounter: Payer: Self-pay | Admitting: Student

## 2022-01-15 ENCOUNTER — Ambulatory Visit (INDEPENDENT_AMBULATORY_CARE_PROVIDER_SITE_OTHER): Payer: Medicaid Other | Admitting: Student

## 2022-01-15 VITALS — BP 123/49 | HR 93 | Ht 72.0 in | Wt 281.0 lb

## 2022-01-15 DIAGNOSIS — Z23 Encounter for immunization: Secondary | ICD-10-CM

## 2022-01-15 DIAGNOSIS — Z0000 Encounter for general adult medical examination without abnormal findings: Secondary | ICD-10-CM | POA: Diagnosis not present

## 2022-01-15 DIAGNOSIS — E118 Type 2 diabetes mellitus with unspecified complications: Secondary | ICD-10-CM

## 2022-01-15 DIAGNOSIS — N939 Abnormal uterine and vaginal bleeding, unspecified: Secondary | ICD-10-CM

## 2022-01-15 DIAGNOSIS — E11319 Type 2 diabetes mellitus with unspecified diabetic retinopathy without macular edema: Secondary | ICD-10-CM

## 2022-01-15 DIAGNOSIS — N950 Postmenopausal bleeding: Secondary | ICD-10-CM | POA: Diagnosis not present

## 2022-01-15 LAB — POCT GLYCOSYLATED HEMOGLOBIN (HGB A1C): HbA1c, POC (controlled diabetic range): 8.7 % — AB (ref 0.0–7.0)

## 2022-01-15 MED ORDER — METFORMIN HCL 1000 MG PO TABS: ORAL_TABLET | ORAL | 2 refills | Status: DC

## 2022-01-15 MED ORDER — SYNJARDY 5-1000 MG PO TABS: 1.0000 | ORAL_TABLET | Freq: Two times a day (BID) | ORAL | 0 refills | Status: DC

## 2022-01-15 NOTE — Assessment & Plan Note (Addendum)
Order pelvic US  - CBC, ferritin, TSH - follow up in 4 weeks or after [place] to discuss results  - patient informed of possible EMB

## 2022-01-15 NOTE — Assessment & Plan Note (Signed)
A1C 8.7. Patient is unable to tolerate SGLT2i due to recurrent yeast infections  - referral to Dr. Valentina Lucks for CGM  - recheck A1C in 3 months  - check lipid and BMP

## 2022-01-15 NOTE — Progress Notes (Signed)
    SUBJECTIVE:   CHIEF COMPLAINT / HPI:   [name] is a 51 y.o. female presenting for annual visit and pap smear.   Vaginal Bleeding:  Last week she noticed blood tinged discharge. She is postmenopausal with last period x2 years. She has a history of uterine fibroids. Denies taking hormonal supplements.   T2DM:  Meds: 2,000 metformin, glipizide 10, Victoza 1.8 daily  Eye Exam: UTD Last A1C: 8.7  PERTINENT  PMH / PSH: HTN, Sleep apnea, IBS, T2DM  OBJECTIVE:   BP (!) 123/49   Pulse 93   Ht 6' (1.829 m)   Wt 281 lb (127.5 kg)   SpO2 100%   BMI 38.11 kg/m   Uses cane at baseline for ambulation  Well-appearing, no acute distress Cardio: Regular rate, regular rhythm, no murmurs on exam. Pulm: Clear, no wheezing, no crackles. No increased work of breathing Abdominal: bowel sounds present, soft, non-tender, non-distended Extremities: no peripheral edema   Pelvic Exam:  Normal external genitalia, no abnormal vaginal discharge, cervix viewed with no lesions observed, no pain with exam. Small amount of blood noted at the os.   ASSESSMENT/PLAN:   Postmenopausal bleeding Order pelvic US  - CBC, ferritin, TSH - follow up in 4 weeks or after [place] to discuss results  - patient informed of possible EMB  Type 2 diabetes mellitus with complication, without long-term current use of insulin (HCC) A1C 8.7. Patient is unable to tolerate SGLT2i due to recurrent yeast infections  - referral to Dr. Valentina Lucks for CGM  - recheck A1C in 3 months  - check lipid and BMP     Darci Current, [place]

## 2022-01-15 NOTE — Patient Instructions (Addendum)
It was great to see you today! Thank you for choosing [facility] for your primary care.   Today we addressed: Diabetes, A1C 8.7, I am changing your metformin and starting you on a combination pill with metformin and Jardiance, this should help with your sugar control  I would like for you to see our pharmacist for continuous glucose monitoring  I am checking blood work today, I will give you a call if anything is abnormal  I also sent in an order for an ultrasound, I would like to see you back in 4 weeks to go over the results and potentially do an endometrial biopsy.   If you haven't already, sign up for My Chart to have easy access to your labs results, and communication with your primary care physician.  We are checking some labs today. If they are abnormal, I will call you. If they are normal, I will send you a MyChart message (if it is active) or a letter in the mail. If you do not hear about your labs in the next 2 weeks, please call the office.   You should return to our clinic Return in about 4 weeks (around 02/12/2022) for CGM with Dr. Valentina Lucks and another appointment with me for ultrasound results .  I recommend that you always bring your medications to each appointment as this makes it easy to ensure you are on the correct medications and helps [place] not miss refills when you need them.  Please arrive 15 minutes before your appointment to ensure smooth check in process.  We appreciate your efforts in making this happen.  Please call the clinic at [phone] if your symptoms worsen or you have any concerns.  Thank you for allowing me to participate in your care, Dr. Sabra Heck

## 2022-01-16 ENCOUNTER — Encounter: Payer: Self-pay | Admitting: Student

## 2022-01-16 LAB — CBC
Hematocrit: 43.7 % (ref 34.0–46.6)
Hemoglobin: 14.4 g/dL (ref 11.1–15.9)
MCH: 27.4 pg (ref 26.6–33.0)
MCHC: 33 g/dL (ref 31.5–35.7)
MCV: 83 fL (ref 79–97)
Platelets: 306 10*3/uL (ref 150–450)
RBC: 5.25 x10E6/uL (ref 3.77–5.28)
RDW: 14 % (ref 11.7–15.4)
WBC: 7.1 10*3/uL (ref 3.4–10.8)

## 2022-01-16 LAB — LIPID PANEL
Chol/HDL Ratio: 3.4 ratio (ref 0.0–4.4)
Cholesterol, Total: 130 mg/dL (ref 100–199)
HDL: 38 mg/dL — ABNORMAL LOW (ref >39)
LDL Chol Calc (NIH): 34 mg/dL (ref 0–99)
Triglycerides: 403 mg/dL — ABNORMAL HIGH (ref 0–149)
VLDL Cholesterol Cal: 58 mg/dL — ABNORMAL HIGH (ref 5–40)

## 2022-01-16 LAB — BASIC METABOLIC PANEL
BUN/Creatinine Ratio: 33 — ABNORMAL HIGH (ref 9–23)
BUN: 17 mg/dL (ref 6–24)
CO2: 25 mmol/L (ref 20–29)
Calcium: 10.1 mg/dL (ref 8.7–10.2)
Chloride: 95 mmol/L — ABNORMAL LOW (ref 96–106)
Creatinine, Ser: 0.51 mg/dL — ABNORMAL LOW (ref 0.57–1.00)
Glucose: 123 mg/dL — ABNORMAL HIGH (ref 70–99)
Potassium: 4.7 mmol/L (ref 3.5–5.2)
Sodium: 138 mmol/L (ref 134–144)
eGFR: 112 mL/min/{1.73_m2} (ref >59)

## 2022-01-16 LAB — VITAMIN B12: Vitamin B-12: 581 pg/mL (ref 232–1245)

## 2022-01-16 LAB — VITAMIN D 25 HYDROXY (VIT D DEFICIENCY, FRACTURES): Vit D, 25-Hydroxy: 35.1 ng/mL (ref 30.0–100.0)

## 2022-01-16 LAB — TSH RFX ON ABNORMAL TO FREE T4: TSH: 3.21 u[IU]/mL (ref 0.450–4.500)

## 2022-01-16 LAB — FERRITIN: Ferritin: 53 ng/mL (ref 15–150)

## 2022-01-17 LAB — CYTOLOGY - PAP
Comment: NEGATIVE
Diagnosis: UNDETERMINED — AB
High risk HPV: NEGATIVE

## 2022-01-18 ENCOUNTER — Telehealth: Payer: Self-pay

## 2022-01-18 ENCOUNTER — Ambulatory Visit (HOSPITAL_COMMUNITY): Admission: RE | Admit: 2022-01-18 | Payer: Medicaid Other | Source: Ambulatory Visit

## 2022-01-18 NOTE — Telephone Encounter (Signed)
Unsure of why ultrasound was cancelled.  No issues with insurance as it didn't need an authorization.  I sent a message to person who cancelled appt to see if she can give me clarify.  Blayke Pinera,CMA

## 2022-01-18 NOTE — Telephone Encounter (Signed)
Patient calls nurse line reporting her [place] was cancelled today.   Patient reports she is unsure why. I could not find any notes regarding this. Unsure if it was an Company secretary.   Will forward to [place] for further advisement.

## 2022-01-19 NOTE — Progress Notes (Signed)
Ultrasound reordered due to accidental cancellation at [facility].  Fedora Knisely,CMA

## 2022-01-19 NOTE — Addendum Note (Signed)
Addended by: Valerie Roys on: 01/19/2022 08:16 AM   Modules accepted: Orders

## 2022-01-22 ENCOUNTER — Ambulatory Visit (HOSPITAL_COMMUNITY): Payer: Medicaid Other

## 2022-01-24 ENCOUNTER — Ambulatory Visit (HOSPITAL_COMMUNITY)
Admission: RE | Admit: 2022-01-24 | Discharge: 2022-01-24 | Disposition: A | Discharge status: Home / Self Care | Payer: Medicaid Other | Source: Ambulatory Visit | Attending: Family Medicine | Admitting: Family Medicine

## 2022-01-24 DIAGNOSIS — N950 Postmenopausal bleeding: Secondary | ICD-10-CM | POA: Insufficient documentation

## 2022-01-24 DIAGNOSIS — N939 Abnormal uterine and vaginal bleeding, unspecified: Secondary | ICD-10-CM | POA: Insufficient documentation

## 2022-01-24 DIAGNOSIS — N858 Other specified noninflammatory disorders of uterus: Secondary | ICD-10-CM | POA: Diagnosis not present

## 2022-01-24 DIAGNOSIS — D259 Leiomyoma of uterus, unspecified: Secondary | ICD-10-CM | POA: Diagnosis not present

## 2022-01-25 ENCOUNTER — Other Ambulatory Visit (HOSPITAL_COMMUNITY): Payer: Self-pay

## 2022-01-27 ENCOUNTER — Telehealth: Payer: Self-pay | Admitting: Student

## 2022-01-27 NOTE — Telephone Encounter (Signed)
Called the patient to discuss her recent ultrasound results for postmenopausal bleeding.  Her endometrial lining was greater then 6 mm requiring an endometrial biopsy.  I discussed with the patient and she is in agreement and will schedule an appointment on Monday.  Darci Current, DO [facility], PGY-1 01/27/22 5:34 PM

## 2022-01-30 ENCOUNTER — Other Ambulatory Visit (HOSPITAL_COMMUNITY): Payer: Self-pay

## 2022-02-01 ENCOUNTER — Ambulatory Visit: Payer: Medicaid Other | Admitting: Pharmacist

## 2022-02-06 ENCOUNTER — Encounter: Payer: Self-pay | Admitting: Student

## 2022-02-06 ENCOUNTER — Telehealth: Payer: Self-pay | Admitting: *Deleted

## 2022-02-06 ENCOUNTER — Other Ambulatory Visit: Payer: Self-pay

## 2022-02-06 ENCOUNTER — Ambulatory Visit (INDEPENDENT_AMBULATORY_CARE_PROVIDER_SITE_OTHER): Payer: Medicaid Other | Admitting: Student

## 2022-02-06 VITALS — BP 110/70 | HR 74 | Ht 72.0 in | Wt 278.0 lb

## 2022-02-06 DIAGNOSIS — N950 Postmenopausal bleeding: Secondary | ICD-10-CM

## 2022-02-06 DIAGNOSIS — N939 Abnormal uterine and vaginal bleeding, unspecified: Secondary | ICD-10-CM | POA: Diagnosis not present

## 2022-02-06 NOTE — Progress Notes (Unsigned)
    SUBJECTIVE:   CHIEF COMPLAINT / HPI:   Post menopausal bleeding  Patient noticed blood tinged discharge and US showed thickened endometrium > 48m. She presents today for endometrial biopsy.   PERTINENT  PMH / PSH: history of Fibroids   OBJECTIVE:   BP 110/70   Pulse 74   Ht 6' (1.829 m)   Wt 278 lb (126.1 kg)   SpO2 91%   BMI 37.70 kg/m    General: NAD, pleasant, able to participate in exam Cardiac: Well perfused Respiratory: Breathing comfortably on room air Extremities: no edema or cyanosis. GU: Normal external female genitalia, moist pink vaginal mucosa with scant discharge, normal-appearing cervix with no lesions, masses, ulcers Skin: warm and dry, no rashes noted Neuro: alert, no obvious focal deficits Psych: Normal affect and mood  ASSESSMENT/PLAN:   Postmenopausal bleeding We performed an endometrial biopsy today due to postmenopausal bleeding.  Future plan will be determined by biopsy results.   Endometrial biopsy: Risk and benefits explained.  Consent signed.  Time out performed.  Speculum inserted, cervix cleansed with betadine,  Tenaculum placed on posterior cervix, passage of uterine sound to 10 cm. Pipelle then was successfully passed to 10 cm with moderate sized/bloody specimen obtained and placed in formalin. Tenaculum removed. Good hemostasis achieved.  Pt tolerated procedure well with minimal discomfort.     Dr. [place]recious [place]ilding [place]

## 2022-02-06 NOTE — Telephone Encounter (Signed)
Prior auth submitted to  [facility] [employer] via [facility]

## 2022-02-06 NOTE — Patient Instructions (Signed)
It was great to see you! Thank you for allowing me to participate in your care!  I recommend that you always bring your medications to each appointment as this makes it easy to ensure you are on the correct medications and helps [place] not miss when refills are needed.  Our plans for today:  - We performed and endometrial biopsy today. I will call you with the results if they are abnormal and with instructions for next steps - You can take tylenol for mild discomfort after the procedure if needed    Take care and seek immediate care sooner if you develop any concerns.   Dr. [name], DO [facility]

## 2022-02-07 ENCOUNTER — Encounter: Payer: Self-pay | Admitting: Registered Nurse

## 2022-02-07 ENCOUNTER — Other Ambulatory Visit (HOSPITAL_COMMUNITY): Payer: Self-pay

## 2022-02-07 ENCOUNTER — Encounter: Payer: Medicaid Other | Attending: Physical Medicine & Rehabilitation | Admitting: Registered Nurse

## 2022-02-07 VITALS — Ht 72.0 in

## 2022-02-07 DIAGNOSIS — Z5181 Encounter for therapeutic drug level monitoring: Secondary | ICD-10-CM | POA: Diagnosis not present

## 2022-02-07 DIAGNOSIS — Z79891 Long term (current) use of opiate analgesic: Secondary | ICD-10-CM | POA: Diagnosis not present

## 2022-02-07 DIAGNOSIS — G629 Polyneuropathy, unspecified: Secondary | ICD-10-CM | POA: Diagnosis not present

## 2022-02-07 DIAGNOSIS — F341 Dysthymic disorder: Secondary | ICD-10-CM

## 2022-02-07 DIAGNOSIS — G894 Chronic pain syndrome: Secondary | ICD-10-CM | POA: Diagnosis not present

## 2022-02-07 DIAGNOSIS — E1140 Type 2 diabetes mellitus with diabetic neuropathy, unspecified: Secondary | ICD-10-CM | POA: Diagnosis not present

## 2022-02-07 MED ORDER — MORPHINE SULFATE 30 MG PO TABS: ORAL_TABLET | ORAL | 0 refills | Status: DC

## 2022-02-07 MED ORDER — MORPHINE SULFATE ER 30 MG PO TBCR
30.0000 mg | EXTENDED_RELEASE_TABLET | Freq: Two times a day (BID) | ORAL | 0 refills | Status: DC
  Filled 2022-02-07 – 2022-02-23 (×2): qty 60, 30d supply, fill #0

## 2022-02-07 NOTE — Progress Notes (Signed)
Subjective:    Patient ID: [name], female    DOB: 09-12-1970, 51 y.o.   MRN: [id number]  HPI: [name] is a 51 y.o. female whose appointment was changed to a telephone visit, she called office reporting abdominal cramping S/P Biopsy. Her appointment was changed to a telephone visit. We have  discussed the limitations of evaluation and management by telemedicine and the availability of in person appointments. The patient expressed understanding and agreed to proceed.    She states her pain is located in her bilateral hands and bilateral feet with tingling and burning. She rates her pain 7.Her current exercise regime is walking and performing stretching exercises.  [name] Morphine equivalent is 120.00 MME.She is also prescribed Clonazepam .We have discussed the black box warning of using opioids and benzodiazepines. I highlighted the dangers of using these drugs together and discussed the adverse events including respiratory suppression, overdose, cognitive impairment and importance of compliance with current regimen. We will continue to monitor and adjust as indicated.     Last UDS was Performed on 11/22/2021, it was consistent.       Pain Inventory Average Pain 7 Pain Right Now 7 My pain is constant, sharp, burning, dull, stabbing, tingling, and aching  In the last 24 hours, has pain interfered with the following? General activity 8 Relation with others 8 Enjoyment of life 9 What TIME of day is your pain at its worst? morning  Sleep (in general) Poor  Pain is worse with: walking, bending, standing, and some activites Pain improves with: rest, medication, and massage Relief from Meds: 8  Family History  Problem Relation Age of Onset   Fibromyalgia Mother    Aortic aneurysm Mother    Stroke Maternal Grandmother    Colon cancer Maternal Grandmother    Heart Problems Paternal Grandmother    Dementia Paternal Grandmother    Heart attack Paternal  Grandfather    Social History   Socioeconomic History   Marital status: Legally Separated    Spouse name: Not on file   Number of children: 1   Years of education: Not on file   Highest education level: Bachelor's degree (e.g., BA, AB, BS)  Occupational History   Occupation: Disabled  Tobacco Use   Smoking status: Never   Smokeless tobacco: Never  Vaping Use   Vaping Use: Never used  Substance and Sexual Activity   Alcohol use: No    Alcohol/week: 0.0 standard drinks of alcohol   Drug use: No    Comment: rx opiods   Sexual activity: Yes    Birth control/protection: Condom  Other Topics Concern   Not on file  Social History Narrative   Not on file   Social Determinants of Health   Financial Resource Strain: Low Risk  (05/09/2018)   Overall Financial Resource Strain (CARDIA)    Difficulty of Paying Living Expenses: Not very hard  Food Insecurity: No Food Insecurity (05/09/2018)   Hunger Vital Sign    Worried About Running Out of Food in the Last Year: Never true    Ran Out of Food in the Last Year: Never true  Transportation Needs: No Transportation Needs (05/09/2018)   PRAPARE - Hydrologist (Medical): No    Lack of Transportation (Non-Medical): No  Physical Activity: Inactive (05/09/2018)   Exercise Vital Sign    Days of Exercise per Week: 0 days    Minutes of Exercise per Session: 0 min  Stress: No  Stress Concern Present (05/09/2018)   [place]    Feeling of Stress : Only a little  Social Connections: Unknown (05/09/2018)   Social Connection and Isolation Panel [NHANES]    Frequency of Communication with Friends and Family: Once a week    Frequency of Social Gatherings with Friends and Family: Once a week    Attends Religious Services: Patient refused    Marine scientist or Organizations: No    Attends Music therapist: Never    Marital Status:  Separated   Past Surgical History:  Procedure Laterality Date   ABDOMINOPLASTY     CHOLECYSTECTOMY     Past Surgical History:  Procedure Laterality Date   ABDOMINOPLASTY     CHOLECYSTECTOMY     Past Medical History:  Diagnosis Date   DEPRESSION/ANXIETY 08/08/2009   Diabetes mellitus without complication ([place])    IBS 02/02/2010   Neuropathy    Polyneuropathy 12/04/2010   Renal stones    Sleep apnea 05/2021   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) 08/08/2009   Ht 6' (1.829 m)   BMI 37.70 kg/m   Opioid Risk Score:   Fall Risk Score:  `1  Depression screen PHQ 2/9     02/07/2022    2:40 PM 02/06/2022   11:10 AM 01/15/2022    3:24 PM 07/18/2021    3:23 PM 06/19/2021    3:10 PM 05/19/2021    2:48 PM 04/24/2021    2:45 PM  Depression screen PHQ 2/9  Decreased Interest '1 1 2 '$ 0 '1 3 1  '$ Down, Depressed, Hopeless '1 1 2 '$ 0 '1 3 1  '$ PHQ - 2 Score '2 2 4 '$ 0 '2 6 2  '$ Altered sleeping  3 3      Tired, decreased energy  3 3      Change in appetite  0 0      Feeling bad or failure about yourself   2 3      Trouble concentrating  0 1      Moving slowly or fidgety/restless  0 0      Suicidal thoughts  0 0      PHQ-9 Score  10 14        Review of Systems  Musculoskeletal:  Positive for gait problem. Negative for back pain.       Pain in hands, feet & legs  All other systems reviewed and are negative.      Objective:   Physical Exam Vitals and nursing note reviewed.  Musculoskeletal:     Comments: No Physical Exam Performed: Telephone Visit         Assessment & Plan:  1.Severe peripheral polyneuropathy of undetermined etiology.: 02/07/2022 Refilled: MS Contin 30 mg one tablet every 12 hours #60 and  MSIR 30 mg one tablet at HS, may take a tablet during the day as needed #60.We will continue the opioid monitoring program, this consists of regular clinic visits, examinations, urine drug screen, pill  counts as well as use of [place] Controlled Substance Reporting system. A 12 month History has been reviewed on the [place] Controlled Substance Reporting System on 02/07/2022 2. Bilateral progressive upper extremity hand pain/ Polyneuropathy. Continue with current medication regime. Tight Glucose Control. Adhere to healthy diet regime. Continue with current medication regimen with nortriptyline: 02/07/2022 3. Muscle Spasms: Continue current medication regimen with  Zanaflex. Continue to Monitor.  02/07/2022 4. Depression/Anxiety: Continue current medication regimen with Paxil and  Klonopin. Continue to Monitor. 02/07/2022  5. Bilateral Chronic Knee Pain: No complaints Today.Continue HEP as Tolerated and Continue to Monitor. 02/07/2022.     F/U in 1 month  Established Patient Telephone Visit Location of Patient: In Her Home Location of Provider: In the Office Total Time Spent: 10 Minutes

## 2022-02-07 NOTE — Assessment & Plan Note (Signed)
We performed an endometrial biopsy today due to postmenopausal bleeding.  Future plan will be determined by biopsy results.

## 2022-02-08 ENCOUNTER — Ambulatory Visit: Payer: Medicaid Other | Admitting: Pharmacist

## 2022-02-09 ENCOUNTER — Encounter: Payer: Self-pay | Admitting: Student

## 2022-02-13 ENCOUNTER — Encounter: Payer: Self-pay | Admitting: Student

## 2022-02-19 ENCOUNTER — Encounter: Payer: Self-pay | Admitting: Student

## 2022-02-19 ENCOUNTER — Other Ambulatory Visit: Payer: Self-pay | Admitting: Student

## 2022-02-19 DIAGNOSIS — N950 Postmenopausal bleeding: Secondary | ICD-10-CM

## 2022-02-19 NOTE — Progress Notes (Signed)
Referral sent to OBGYN d/t biopsy showing disordered proliferative endometrium in post menopausal patient.

## 2022-02-23 ENCOUNTER — Other Ambulatory Visit (HOSPITAL_COMMUNITY): Payer: Self-pay

## 2022-02-28 ENCOUNTER — Other Ambulatory Visit: Payer: Self-pay | Admitting: Student

## 2022-02-28 ENCOUNTER — Other Ambulatory Visit (HOSPITAL_COMMUNITY): Payer: Self-pay

## 2022-02-28 DIAGNOSIS — E119 Type 2 diabetes mellitus without complications: Secondary | ICD-10-CM

## 2022-03-01 ENCOUNTER — Other Ambulatory Visit (HOSPITAL_COMMUNITY): Payer: Self-pay

## 2022-03-01 ENCOUNTER — Ambulatory Visit: Payer: Medicaid Other | Admitting: Pharmacist

## 2022-03-02 ENCOUNTER — Other Ambulatory Visit: Payer: Self-pay | Admitting: Student

## 2022-03-02 DIAGNOSIS — E118 Type 2 diabetes mellitus with unspecified complications: Secondary | ICD-10-CM

## 2022-03-09 ENCOUNTER — Encounter: Payer: Medicaid Other | Admitting: Registered Nurse

## 2022-03-12 ENCOUNTER — Encounter: Payer: Medicaid Other | Admitting: Registered Nurse

## 2022-03-13 ENCOUNTER — Encounter: Payer: Self-pay | Admitting: Student

## 2022-03-20 ENCOUNTER — Telehealth: Payer: Self-pay | Admitting: Registered Nurse

## 2022-03-20 ENCOUNTER — Encounter: Payer: Medicaid Other | Admitting: Registered Nurse

## 2022-03-20 ENCOUNTER — Other Ambulatory Visit (HOSPITAL_COMMUNITY): Payer: Self-pay

## 2022-03-20 MED ORDER — MORPHINE SULFATE ER 30 MG PO TBCR
30.0000 mg | EXTENDED_RELEASE_TABLET | Freq: Two times a day (BID) | ORAL | 0 refills | Status: DC
  Filled 2022-03-20: qty 60, 30d supply, fill #0
  Filled 2022-03-27: qty 14, 7d supply, fill #0
  Filled 2022-04-02: qty 14, 7d supply, fill #1
  Filled 2022-04-04: qty 46, 23d supply, fill #1

## 2022-03-20 MED ORDER — MORPHINE SULFATE ER 30 MG PO TBCR: 30.0000 mg | EXTENDED_RELEASE_TABLET | Freq: Two times a day (BID) | ORAL | 0 refills | Status: DC

## 2022-03-20 NOTE — Progress Notes (Deleted)
Subjective:    Patient ID: [name], female    DOB: [birth year], 51 y.o.   MRN: [id number]  HPI Pain Inventory Average Pain {NUMBERS; 0-10:5044} Pain Right Now {NUMBERS; 0-10:5044} My pain is {PAIN DESCRIPTION:21022940}  In the last 24 hours, has pain interfered with the following? General activity {NUMBERS; 0-10:5044} Relation with others {NUMBERS; 0-10:5044} Enjoyment of life {NUMBERS; 0-10:5044} What TIME of day is your pain at its worst? {time of day:24191} Sleep (in general) {BHH GOOD/FAIR/POOR:22877}  Pain is worse with: {ACTIVITIES:21022942} Pain improves with: {PAIN IMPROVES FUXN:23557322} Relief from Meds: {NUMBERS; 0-10:5044}  Family History  Problem Relation Age of Onset   Fibromyalgia Mother    Aortic aneurysm Mother    Stroke Maternal Grandmother    Colon cancer Maternal Grandmother    Heart Problems Paternal Grandmother    Dementia Paternal Grandmother    Heart attack Paternal Grandfather    Social History   Socioeconomic History   Marital status: Legally Separated    Spouse name: Not on file   Number of children: 1   Years of education: Not on file   Highest education level: Bachelor's degree (e.g., BA, AB, BS)  Occupational History   Occupation: Disabled  Tobacco Use   Smoking status: Never   Smokeless tobacco: Never  Vaping Use   Vaping Use: Never used  Substance and Sexual Activity   Alcohol use: No    Alcohol/week: 0.0 standard drinks of alcohol   Drug use: No    Comment: rx opiods   Sexual activity: Yes    Birth control/protection: Condom  Other Topics Concern   Not on file  Social History Narrative   Not on file   Social Determinants of Health   Financial Resource Strain: Low Risk  (05/09/2018)   Overall Financial Resource Strain (CARDIA)    Difficulty of Paying Living Expenses: Not very hard  Food Insecurity: No Food Insecurity (05/09/2018)   Hunger Vital Sign    Worried About Running Out of Food in the Last Year: Never  true    Ran Out of Food in the Last Year: Never true  Transportation Needs: No Transportation Needs (05/09/2018)   PRAPARE - Hydrologist (Medical): No    Lack of Transportation (Non-Medical): No  Physical Activity: Inactive (05/09/2018)   Exercise Vital Sign    Days of Exercise per Week: 0 days    Minutes of Exercise per Session: 0 min  Stress: No Stress Concern Present (05/09/2018)   [name]    Feeling of Stress : Only a little  Social Connections: Unknown (05/09/2018)   Social Connection and Isolation Panel [NHANES]    Frequency of Communication with Friends and Family: Once a week    Frequency of Social Gatherings with Friends and Family: Once a week    Attends Religious Services: Patient refused    Marine scientist or Organizations: No    Attends Music therapist: Never    Marital Status: Separated   Past Surgical History:  Procedure Laterality Date   ABDOMINOPLASTY     CHOLECYSTECTOMY     Past Surgical History:  Procedure Laterality Date   ABDOMINOPLASTY     CHOLECYSTECTOMY     Past Medical History:  Diagnosis Date   DEPRESSION/ANXIETY 08/08/2009   Diabetes mellitus without complication ([place])    IBS 02/02/2010   Neuropathy    Polyneuropathy 12/04/2010   Renal stones    Sleep  apnea 05/2021   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  Opioid Risk Score:   Fall Risk Score:  `1  Depression screen PHQ 2/9     02/07/2022    2:40 PM 02/06/2022   11:10 AM 01/15/2022    3:24 PM 07/18/2021    3:23 PM 06/19/2021    3:10 PM 05/19/2021    2:48 PM 04/24/2021    2:45 PM  Depression screen PHQ 2/9  Decreased Interest '1 1 2 '$ 0 '1 3 1  '$ Down, Depressed, Hopeless '1 1 2 '$ 0 '1 3 1  '$ PHQ - 2 Score '2 2 4 '$ 0 '2 6 2  '$ Altered sleeping  3 3       Tired, decreased energy  3 3      Change in appetite  0 0      Feeling bad or failure about yourself   2 3      Trouble concentrating  0 1      Moving slowly or fidgety/restless  0 0      Suicidal thoughts  0 0      PHQ-9 Score  10 14          Review of Systems     Objective:   Physical Exam        Assessment & Plan:

## 2022-03-20 NOTE — Telephone Encounter (Signed)
PMP was Reviewed.  MS Contin e-scribed today.  Ms. Johnstone is aware via My- Chart message.

## 2022-03-27 ENCOUNTER — Encounter: Payer: Self-pay | Admitting: Registered Nurse

## 2022-03-27 ENCOUNTER — Encounter: Payer: Medicaid Other | Attending: Physical Medicine & Rehabilitation | Admitting: Registered Nurse

## 2022-03-27 ENCOUNTER — Other Ambulatory Visit (HOSPITAL_COMMUNITY): Payer: Self-pay

## 2022-03-27 VITALS — BP 125/79 | HR 110 | Ht 72.0 in | Wt 271.0 lb

## 2022-03-27 DIAGNOSIS — R000 Tachycardia, unspecified: Secondary | ICD-10-CM | POA: Insufficient documentation

## 2022-03-27 DIAGNOSIS — F341 Dysthymic disorder: Secondary | ICD-10-CM | POA: Diagnosis not present

## 2022-03-27 DIAGNOSIS — Z5181 Encounter for therapeutic drug level monitoring: Secondary | ICD-10-CM | POA: Insufficient documentation

## 2022-03-27 DIAGNOSIS — E1140 Type 2 diabetes mellitus with diabetic neuropathy, unspecified: Secondary | ICD-10-CM | POA: Insufficient documentation

## 2022-03-27 DIAGNOSIS — G894 Chronic pain syndrome: Secondary | ICD-10-CM | POA: Diagnosis not present

## 2022-03-27 DIAGNOSIS — Z79891 Long term (current) use of opiate analgesic: Secondary | ICD-10-CM | POA: Insufficient documentation

## 2022-03-27 MED ORDER — MORPHINE SULFATE 30 MG PO TABS: ORAL_TABLET | ORAL | 0 refills | Status: DC

## 2022-03-27 MED ORDER — CLONAZEPAM 0.5 MG PO TABS
1.5000 mg | ORAL_TABLET | Freq: Every evening | ORAL | 3 refills | Status: DC | PRN
  Filled 2022-03-27 – 2022-04-25 (×2): qty 90, 30d supply, fill #0

## 2022-03-27 NOTE — Progress Notes (Signed)
Subjective:    Patient ID: [name], female    DOB: 10-09-1970, 52 y.o.   MRN: [id number]  HPI: [name] is a 52 y.o. female who returns for follow up appointment for chronic pain and medication refill. She states her pain is located in her bilateral hands and bilateral feet with tingling and burning. She rates her pain  7. Her current exercise regime is walking and riding her stationary bicycle intermittently.   Ms. Wartman Morphine equivalent is 120.00  MME. She s also prescribed Clonazepam  .We have discussed the black box warning of using opioids and benzodiazepines. I highlighted the dangers of using these drugs together and discussed the adverse events including respiratory suppression, overdose, cognitive impairment and importance of compliance with current regimen. We will continue to monitor and adjust as indicated.   UDS ordered today.    Pain Inventory Average Pain 7 Pain Right Now 7 My pain is constant, sharp, burning, dull, stabbing, tingling, and aching  In the last 24 hours, has pain interfered with the following? General activity 7 Relation with others 7 Enjoyment of life 6 What TIME of day is your pain at its worst? morning  Sleep (in general) Poor  Pain is worse with: walking, bending, standing, and some activites Pain improves with: rest and medication Relief from Meds: 8  Family History  Problem Relation Age of Onset   Fibromyalgia Mother    Aortic aneurysm Mother    Stroke Maternal Grandmother    Colon cancer Maternal Grandmother    Heart Problems Paternal Grandmother    Dementia Paternal Grandmother    Heart attack Paternal Grandfather    Social History   Socioeconomic History   Marital status: Legally Separated    Spouse name: Not on file   Number of children: 1   Years of education: Not on file   Highest education level: Bachelor's degree (e.g., BA, AB, BS)  Occupational History   Occupation: Disabled  Tobacco Use    Smoking status: Never   Smokeless tobacco: Never  Vaping Use   Vaping Use: Never used  Substance and Sexual Activity   Alcohol use: No    Alcohol/week: 0.0 standard drinks of alcohol   Drug use: No    Comment: rx opiods   Sexual activity: Yes    Birth control/protection: Condom  Other Topics Concern   Not on file  Social History Narrative   Not on file   Social Determinants of Health   Financial Resource Strain: Low Risk  (05/09/2018)   Overall Financial Resource Strain (CARDIA)    Difficulty of Paying Living Expenses: Not very hard  Food Insecurity: No Food Insecurity (05/09/2018)   Hunger Vital Sign    Worried About Running Out of Food in the Last Year: Never true    Ran Out of Food in the Last Year: Never true  Transportation Needs: No Transportation Needs (05/09/2018)   PRAPARE - Hydrologist (Medical): No    Lack of Transportation (Non-Medical): No  Physical Activity: Inactive (05/09/2018)   Exercise Vital Sign    Days of Exercise per Week: 0 days    Minutes of Exercise per Session: 0 min  Stress: No Stress Concern Present (05/09/2018)   [place]    Feeling of Stress : Only a little  Social Connections: Unknown (05/09/2018)   Social Connection and Isolation Panel [NHANES]    Frequency of Communication with Friends  and Family: Once a week    Frequency of Social Gatherings with Friends and Family: Once a week    Attends Religious Services: Patient refused    Marine scientist or Organizations: No    Attends Music therapist: Never    Marital Status: Separated   Past Surgical History:  Procedure Laterality Date   ABDOMINOPLASTY     CHOLECYSTECTOMY     Past Surgical History:  Procedure Laterality Date   ABDOMINOPLASTY     CHOLECYSTECTOMY     Past Medical History:  Diagnosis Date   DEPRESSION/ANXIETY 08/08/2009   Diabetes mellitus without complication  ([place])    IBS 02/02/2010   Neuropathy    Polyneuropathy 12/04/2010   Renal stones    Sleep apnea 05/2021   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  Opioid Risk Score:   Fall Risk Score:  `1  Depression screen PHQ 2/9     02/07/2022    2:40 PM 02/06/2022   11:10 AM 01/15/2022    3:24 PM 07/18/2021    3:23 PM 06/19/2021    3:10 PM 05/19/2021    2:48 PM 04/24/2021    2:45 PM  Depression screen PHQ 2/9  Decreased Interest '1 1 2 '$ 0 '1 3 1  '$ Down, Depressed, Hopeless '1 1 2 '$ 0 '1 3 1  '$ PHQ - 2 Score '2 2 4 '$ 0 '2 6 2  '$ Altered sleeping  3 3      Tired, decreased energy  3 3      Change in appetite  0 0      Feeling bad or failure about yourself   2 3      Trouble concentrating  0 1      Moving slowly or fidgety/restless  0 0      Suicidal thoughts  0 0      PHQ-9 Score  10 14        Review of Systems  Musculoskeletal:  Positive for gait problem.       Pain I'm lower arms and hands, lower knees & legs, right hip pain  All other systems reviewed and are negative.     Objective:   Physical Exam Vitals and nursing note reviewed.  Constitutional:      Appearance: Normal appearance.  Cardiovascular:     Rate and Rhythm: Normal rate and regular rhythm.     Pulses: Normal pulses.     Heart sounds: Normal heart sounds.  Pulmonary:     Effort: Pulmonary effort is normal.     Breath sounds: Normal breath sounds.  Musculoskeletal:     Cervical back: Normal range of motion and neck supple.     Comments: Normal Muscle Bulk and Muscle Testing Reveals:  Upper Extremities: Full ROM and Muscle Strength 5/5 Lower Extremities: Full ROM and Muscle Strength 5/5 Arises from Table with ease Narrow Based  Gait     Skin:    General: Skin is warm and dry.  Neurological:     Mental Status: She is alert and oriented to person, place, and time.  Psychiatric:        Mood  and Affect: Mood normal.        Behavior: Behavior normal.         Assessment & Plan:  1.Severe peripheral polyneuropathy of undetermined etiology.: 03/27/2022 Refilled: MS Contin 30 mg one tablet every 12  hours #60 and  MSIR 30 mg one tablet at HS, may take a tablet during the day as needed #60.We will continue the opioid monitoring program, this consists of regular clinic visits, examinations, urine drug screen, pill counts as well as use of [place] Controlled Substance Reporting system. A 12 month History has been reviewed on the [place] Controlled Substance Reporting System on 03/27/2022 2. Bilateral progressive upper extremity hand pain/ Polyneuropathy. Continue with current medication regime. Tight Glucose Control. Adhere to healthy diet regime. Continue with current medication regimen with nortriptyline: 03/27/2022 3. Muscle Spasms: Continue current medication regimen with  Zanaflex. Continue to Monitor.  03/27/2022 4. Depression/Anxiety: Continue current medication regimen with Paxil and  Klonopin. Continue to Monitor. 03/27/2022  5. Bilateral Chronic Knee Pain: No complaints Today.Continue HEP as Tolerated and Continue to Monitor. 03/27/2022.     F/U in 1 month

## 2022-03-28 ENCOUNTER — Telehealth: Payer: Self-pay

## 2022-03-28 ENCOUNTER — Other Ambulatory Visit: Payer: Self-pay | Admitting: Student

## 2022-03-28 ENCOUNTER — Other Ambulatory Visit (HOSPITAL_COMMUNITY): Payer: Self-pay

## 2022-03-28 DIAGNOSIS — E119 Type 2 diabetes mellitus without complications: Secondary | ICD-10-CM

## 2022-03-28 NOTE — Telephone Encounter (Signed)
[facility] has reviewed the request for Morphine Sul Tab '30mg'$  Er submitted by Danella Sensing on behalf of Gilberto Streck on 03/28/2022. After review, the request for service is: Approved through 06/27/2022.

## 2022-03-28 NOTE — Telephone Encounter (Signed)
PA for Morphine ER 30 MG ER submitted in Cover My Meds on 03/28/2022.

## 2022-03-29 LAB — TOXASSURE SELECT,+ANTIDEPR,UR

## 2022-04-02 ENCOUNTER — Other Ambulatory Visit: Payer: Self-pay

## 2022-04-04 ENCOUNTER — Other Ambulatory Visit (HOSPITAL_COMMUNITY): Payer: Self-pay

## 2022-04-05 ENCOUNTER — Ambulatory Visit: Payer: Medicaid Other | Admitting: Pharmacist

## 2022-04-06 ENCOUNTER — Telehealth: Payer: Self-pay

## 2022-04-06 NOTE — Telephone Encounter (Signed)
PA submitted for Morphine IR 

## 2022-04-09 NOTE — Telephone Encounter (Signed)
We received a prior authorization request for the member and product listed above. The [facility] Prior Authorization Team is not able to review this request because the requested product has been previously approved under [id number]. Based on the information reviewed, the requested prescription is currently authorized for coverage by the plan until 07/11/2022. Please resubmit this request within 30 days of authorization expiration date.

## 2022-04-15 ENCOUNTER — Encounter: Payer: Self-pay | Admitting: Student

## 2022-04-16 ENCOUNTER — Other Ambulatory Visit: Payer: Self-pay | Admitting: Student

## 2022-04-16 DIAGNOSIS — E119 Type 2 diabetes mellitus without complications: Secondary | ICD-10-CM

## 2022-04-16 MED ORDER — VICTOZA 18 MG/3ML ~~LOC~~ SOPN: PEN_INJECTOR | SUBCUTANEOUS | 0 refills | Status: DC

## 2022-04-19 ENCOUNTER — Ambulatory Visit: Payer: Medicaid Other | Admitting: Pharmacist

## 2022-04-25 ENCOUNTER — Other Ambulatory Visit (HOSPITAL_COMMUNITY): Payer: Self-pay

## 2022-04-25 ENCOUNTER — Other Ambulatory Visit: Payer: Self-pay | Admitting: Registered Nurse

## 2022-04-26 ENCOUNTER — Other Ambulatory Visit: Payer: Self-pay | Admitting: Registered Nurse

## 2022-04-26 ENCOUNTER — Other Ambulatory Visit (HOSPITAL_COMMUNITY): Payer: Self-pay

## 2022-04-26 ENCOUNTER — Ambulatory Visit (INDEPENDENT_AMBULATORY_CARE_PROVIDER_SITE_OTHER): Payer: Medicaid Other | Admitting: Pharmacist

## 2022-04-26 ENCOUNTER — Telehealth: Payer: Self-pay | Admitting: Registered Nurse

## 2022-04-26 VITALS — BP 123/73 | HR 103 | Wt 275.2 lb

## 2022-04-26 DIAGNOSIS — E1140 Type 2 diabetes mellitus with diabetic neuropathy, unspecified: Secondary | ICD-10-CM

## 2022-04-26 DIAGNOSIS — E118 Type 2 diabetes mellitus with unspecified complications: Secondary | ICD-10-CM

## 2022-04-26 LAB — POCT GLYCOSYLATED HEMOGLOBIN (HGB A1C): HbA1c, POC (controlled diabetic range): 9.9 % — AB (ref 0.0–7.0)

## 2022-04-26 MED ORDER — MORPHINE SULFATE ER 30 MG PO TBCR
30.0000 mg | EXTENDED_RELEASE_TABLET | Freq: Two times a day (BID) | ORAL | 0 refills | Status: DC
  Filled 2022-04-26: qty 60, 30d supply, fill #0

## 2022-04-26 NOTE — Progress Notes (Signed)
S:     Chief Complaint  Patient presents with   Medication Management    Diabetes - LIBERATE Study    52 y.o. female who presents for diabetes evaluation, education, and management in the context of the LIBERATE Study.   PMH is significant for long history of hypertriglyceridemia, neuropathy and diabetes.  Patient was referred and last seen by Primary Care Provider, Dr. Ronnald Ramp, on 04/16/2022.   At last visit, patient was informed of the LIBERATE STUDY.    Today, patient arrives in good spirits and presents with assistance of a cane which she reports helps with the chronic neuropathic pain.   Patient reports Diabetes was diagnosed at the age of 39 (2001).   Current diabetes medications include: Glipizide 48m TID, Victoza (liraglutide) 1.855mdaily, and Metformin 100067mID Current hypertension medications include: enalapril10m80mCurrent hyperlipidemia medications include: atorvastatin 80mg54mly, fenofibrate 145mg 73my Patient reports adherence to taking all medications as prescribed.    Do you feel that your medications are working for you? no Have you been experiencing any side effects to the medications prescribed? Yes - concern for weight gain due to paroxetine Insurance coverage: Medicaid  Patient denies hypoglycemic events.  Patient reports nocturia (nighttime urination). 2-3x Patient reports neuropathy (nerve pain). Sensation includes burning, tingling, aching.  Patient reports visual changes. Patient reports self foot exams.   Patient reported dietary habits: Eats 1 meal/day, "grazes the rest of the day" Breakfast: cereal (looks for low sugar) Lunch: n/a Dinner: Pasta (says it is cheap and other foods are so expensive), mac & cheese. Snacks: humus and carrots, veggie burger (sometimes just the patty), sugar-free popsicles Drinks: sugar free/diet sports drinks  Patient-reported exercise habits: limited due to pain, neuropathy   O:   Review of Systems   Musculoskeletal:  Positive for back pain and joint pain.  Neurological:  Positive for sensory change.    Physical Exam Vitals reviewed.  Constitutional:      Appearance: Normal appearance. She is normal weight.  Pulmonary:     Effort: Pulmonary effort is normal.  Neurological:     Mental Status: She is alert.  Psychiatric:        Mood and Affect: Mood normal.        Behavior: Behavior normal.   Lab Results  Component Value Date   HGBA1C 9.9 (A) 04/26/2022   POC A1c Today: 9.9%  Vitals:   04/26/22 1518 04/26/22 1549  BP: (!) 137/43 123/73  Pulse: (!) 101 (!) 103  SpO2: 100% 99%    Lipid Panel     Component Value Date/Time   CHOL 130 01/15/2022 1709   TRIG 403 (H) 01/15/2022 1709   HDL 38 (L) 01/15/2022 1709   CHOLHDL 3.4 01/15/2022 1709   CHOLHDL 4.6 07/18/2015 1630   VLDL NOT CALC 07/18/2015 1630   LDLCALC 34 01/15/2022 1709   LDLDIRECT 35 06/14/2011 1555    Clinical Atherosclerotic Cardiovascular Disease (ASCVD): No  The 10-year ASCVD risk score (Arnett DK, et al., 2019) is: 3.9%   Values used to calculate the score:     Age: 78 yea24     Sex: Female     Is Non-Hispanic African American: No     Diabetic: Yes     Tobacco smoker: No     Systolic Blood Pressure: 123 mm081    Is BP treated: Yes     HDL Cholesterol: 38 mg/dL     Total Cholesterol: 130 mg/dL   Patient is participating  in a Managed Medicaid Plan:  Yes    A/P:  LIBERATE Study:  - Provided education on Libre 3 CGM. Collaborated to ensure Elenor Legato 3 app was downloaded on patient's phone. Educated on how to place sensor every 14 days, patient placed first sensor correctly and verbalized understanding of use, removal, and how to place next sensor. Discussed alarms. 8 sensors provided for a 3 month supply. Educated to contact the office if the sensor falls off early and replacements are needed before their next [employer].   Diabetes longstanding and currently poorly controlled as evidenced by  A1C today of 9.9. Patient is able to verbalize appropriate hypoglycemia management plan, however is reluctant to start insulin due to fear of weight gain and inability to "come off of insulin". Medication adherence appears good overall. Control is suboptimal due to limited ADL movement due to pain, and dietary indiscretion.  - Unwilling to try  basal insulin at this time.  Reviewed potential option in future. -Continued GLP-1 Victoza (liraglutide) at 1.58m daily.  -reports inability to tolerate SGLT2-I  due to yeast infections.   Consider if we can obtain A1C value less than 8.5 in the future.  -Continued metformin 1009mBID WC   -Patient educated on purpose, proper use, and potential adverse effects.  -Extensively discussed pathophysiology of diabetes, recommended lifestyle interventions, dietary effects on blood sugar control.  -Counseled on s/sx of and management of hypoglycemia.  -Next A1c anticipated 3 month LIBERATE second visit.   ASCVD risk - primary prevention in patient with diabetes and hypertriglyceridemia.   - Continue high intensity statin - Continue fenofibrate - Assess progress with triglycerides when glucose improves.   Discussed possibility of cross-taper from paroxetine to duloxetine.  Discuss and consider more at next visit.   Written patient instructions provided. Patient verbalized understanding of treatment plan.  Total time in face to face counseling 60 minutes.    Follow-up:  Pharmacist Phone Call on 2/8 and visit on 2/15  PCP clinic visit in 2-3 months.  Patient seen with PaDixon BoosPharmD Candidate.

## 2022-04-26 NOTE — Patient Instructions (Signed)
[name], it was a pleasure seeing you today.   The sensor is small waterproof disc that is placed on the back of the upper arm.  There is a very thin filament that is inserted under the surface of the skin and measures the amount of glucose in the interstitial fluid.  This system collects your sugar levels for up to 14 days and it automatically records the glucose level every 15 minutes. This will show your provider any patterns in your glucose levels.  Please remember... 1. Sensor will last 14 days 2. Sensor should be applied to area away from scarring, tattoos, irritation, and bones. 3. Starting the sensor: 1 hour warm up before BG readings available   4. Scan the sensor at least every 8 hours 5. Hold reader within 1.5 inches of sensor to scan 6. When the blood drop and magnifying glass symbol appears, test fingerstick blood glucose prior to making treatment decisions 7. Do a fingerstick blood glucose test if the sensor readings do not match how you feel 8. Remove sensor prior to magnetic resonance imaging (MRI), computed tomography (CT) scan, or high-frequency electrical heat (diathermy) treatment. 9. Freestyle Libre may be worn through a Environmental education officer. It may not be exposed to an advanced Imaging Technology (AIT) body scanner (also called a millimeter wave scanner) or the baggage x-ray machine. Instead, ask for hand-wanding or full-body pat-down and visual inspection.  10. Doses of vitamin C (ascorbic acid) >500 mg every day may cause false high readings. 11. Do not submerge more than 3 feet or keep underwater longer than 30 minutes at a time. Gently pat to dry.  12. Store sensor kit between 39 and 77 degrees Farenheit. Can be refrigerated within this temperature range.  Problems with Freestyle Libre sticking? 1. Order Tegaderm I.V. films to place directly over [facility] sensor on arm. 2. May also order Skin Tac from [facility]. Alcohol swab area you plan to  administer Freestyle Libre then let dry. Once dry, apply Skin Tac in a circular motion (with a spot in the middle for sensor without skin tac) and let dry. Once dry you can apply Freestyle Libre   Problems taking off Freestyle [place]? 1. Remember to try to shower before removing Freestyle Libre 2. Order Tac Away to help remove any extra adhesive left on your skin once you remove Freestyle Libre 3. May also try baby oil to loosen adhesive  [place] Phone number: [phone] Available 7 days a week; excluding holidays 8 AM to 8PM EST  [email or web address]  Please do the following:  Continue checking blood sugars at home. It's really important that you record these and bring these in to your next doctor's appointment.  Continue making the lifestyle changes we've discussed together during our visit. Diet and exercise play a significant role in improving your blood sugars.  Follow-up with me/PCP in 2 weeks (05/10/2022).   Hypoglycemia or low blood sugar:   Low blood sugar can happen quickly and may become an emergency if not treated right away.   While this shouldn't happen often, it can be brought upon if you skip a meal or do not eat enough. Also, if your insulin or other diabetes medications are dosed too high, this can cause your blood sugar to go to low.   Warning signs of low blood sugar include: Feeling shaky or dizzy Feeling weak or tired  Excessive hunger Feeling anxious or upset  Sweating even when you aren't exercising  What to  do if I experience low blood sugar? Follow the Rule of 15 Check your blood sugar. If lower than 70, proceed to step 2.  Treat with 15 grams of fast acting carbs which is found in 3-4 glucose tablets. If none are available you can try hard candy, 1 tablespoon of sugar or honey,4 ounces of fruit juice, or 6 ounces of REGULAR soda.  Re-check your sugar in 15 minutes. If it is still below 70, do what you did in step 2 again. If your  blood sugar has come back up, go ahead and eat a snack or small meal made up of complex carbs (ex. Whole grains) and protein at this time to avoid recurrence of low blood sugar.

## 2022-04-26 NOTE — Telephone Encounter (Signed)
PMP was reviewed.  MS Contin e-scribed today.  [name] is aware via My-Chart

## 2022-04-27 NOTE — Assessment & Plan Note (Signed)
LIBERATE Study:  - Provided education on Libre 3 CGM. Collaborated to ensure Elenor Legato 3 app was downloaded on patient's phone. Educated on how to place sensor every 14 days, patient placed first sensor correctly and verbalized understanding of use, removal, and how to place next sensor. Discussed alarms. 8 sensors provided for a 3 month supply. Educated to contact the office if the sensor falls off early and replacements are needed before their next [employer].   Diabetes longstanding and currently poorly controlled as evidenced by A1C today of 9.9. Patient is able to verbalize appropriate hypoglycemia management plan, however is reluctant to start insulin due to fear of weight gain and inability to "come off of insulin". Medication adherence appears good overall. Control is suboptimal due to limited ADL movement due to pain, and dietary indiscretion.  -Unwilling to try basal insulin at this time.  Reviewed potential option in future. -Continued GLP-1 Victoza (liraglutide) at 1.'8mg'$  daily.  -reports inability to tolerate SGLT2-I  due to yeast infections.   Consider if we can obtain A1C value less than 8.5 in the future.  -Continued metformin '1000mg'$  BID WC   -Patient educated on purpose, proper use, and potential adverse effects.  -Extensively discussed pathophysiology of diabetes, recommended lifestyle interventions, dietary effects on blood sugar control.  -Counseled on s/sx of and management of hypoglycemia.  -Next A1c anticipated 3 month LIBERATE second visit.   ASCVD risk - primary prevention in patient with diabetes and hypertriglyceridemia.   - Continue high intensity statin -Continue fenofibrate.  - Assess progress with triglycerides when glucose improves.

## 2022-04-27 NOTE — Assessment & Plan Note (Deleted)
LIBERATE Study:  - Provided education on Libre 3 CGM. Collaborated to ensure Elenor Legato 3 app was downloaded on patient's phone. Educated on how to place sensor every 14 days, patient placed first sensor correctly and verbalized understanding of use, removal, and how to place next sensor. Discussed alarms. 8 sensors provided for a 3 month supply. Educated to contact the office if the sensor falls off early and replacements are needed before their next [employer].   Diabetes longstanding and currently poorly controlled as evidenced by A1C today of 9.9. Patient is able to verbalize appropriate hypoglycemia management plan, however is reluctant to start insulin due to fear of weight gain and inability to "come off of insulin". Medication adherence appears good overall. Control is suboptimal due to limited ADL movement due to pain, and dietary indiscretion.  -Unwilling to try basal insulin at this time.  Reviewed potential option in future. -Continued GLP-1 Victoza (liraglutide) at 1.'8mg'$  daily.  -reports inability to tolerate SGLT2-I  due to yeast infections.   Consider if we can obtain A1C value less than 8.5 in the future.  -Continued metformin '1000mg'$  BID WC   -Patient educated on purpose, proper use, and potential adverse effects.  -Extensively discussed pathophysiology of diabetes, recommended lifestyle interventions, dietary effects on blood sugar control.  -Counseled on s/sx of and management of hypoglycemia.  -Next A1c anticipated 3 month LIBERATE second visit.

## 2022-04-29 ENCOUNTER — Encounter: Payer: Self-pay | Admitting: Student

## 2022-04-30 ENCOUNTER — Telehealth: Payer: Self-pay

## 2022-04-30 ENCOUNTER — Encounter (INDEPENDENT_AMBULATORY_CARE_PROVIDER_SITE_OTHER): Payer: Self-pay

## 2022-04-30 NOTE — Telephone Encounter (Signed)
Patient calls nurse line reporting high CBGs over the weekend since starting [place].   She reports her CBGs have been "consistently" in the mid 300s. She reports fasting CBGs in the low 300s. She reports most recent cbg of 229.  She reports she is asymptomatic. She denies any "unusual fatigue," excessive thirst, blurred vision or urinary frequency.   She reports compliance with her DM medications.   She reports a FU apt with Koval on 2/15.  ED precautions discussed with patient. Encouraged hydration.   Will forward to [facility] and PCP.

## 2022-04-30 NOTE — Progress Notes (Signed)
Reviewed: I agree with Dr. Koval's documentation and management. 

## 2022-05-01 NOTE — Progress Notes (Signed)
Subjective:    Patient ID: [name], female    DOB: 04-02-1970, 52 y.o.   MRN: [id number]  HPI: [name] is a 52 y.o. female who returns for follow up appointment for chronic pain and medication refill. She states her  pain is located in her bilateral hands and bilateral feet with tingling and burning. She rates her pain 6. Her current exercise regime is walking and performing stretching exercises.  [name] Morphine equivalent is 120.00 MME. She is also prescribed Clonazepam .We have discussed the black box warning of using opioids and benzodiazepines. I highlighted the dangers of using these drugs together and discussed the adverse events including respiratory suppression, overdose, cognitive impairment and importance of compliance with current regimen. We will continue to monitor and adjust as indicated.   Last UDS was Performed on 03/27/2022, it was consistent.      Pain Inventory Average Pain 6 Pain Right Now 6 My pain is constant, sharp, burning, dull, stabbing, tingling, and aching  In the last 24 hours, has pain interfered with the following? General activity 8 Relation with others 8 Enjoyment of life 8 What TIME of day is your pain at its worst? evening and night Sleep (in general) Poor  Pain is worse with: walking, bending, standing, and some activites Pain improves with: rest and medication Relief from Meds: 7  Family History  Problem Relation Age of Onset   Fibromyalgia Mother    Aortic aneurysm Mother    Stroke Maternal Grandmother    Colon cancer Maternal Grandmother    Heart Problems Paternal Grandmother    Dementia Paternal Grandmother    Heart attack Paternal Grandfather    Social History   Socioeconomic History   Marital status: Legally Separated    Spouse name: Not on file   Number of children: 1   Years of education: Not on file   Highest education level: Bachelor's degree (e.g., BA, AB, BS)  Occupational History    Occupation: Disabled  Tobacco Use   Smoking status: Never   Smokeless tobacco: Never  Vaping Use   Vaping Use: Never used  Substance and Sexual Activity   Alcohol use: No    Alcohol/week: 0.0 standard drinks of alcohol   Drug use: No    Comment: rx opiods   Sexual activity: Yes    Birth control/protection: Condom  Other Topics Concern   Not on file  Social History Narrative   Not on file   Social Determinants of Health   Financial Resource Strain: Low Risk  (05/09/2018)   Overall Financial Resource Strain (CARDIA)    Difficulty of Paying Living Expenses: Not very hard  Food Insecurity: No Food Insecurity (05/09/2018)   Hunger Vital Sign    Worried About Running Out of Food in the Last Year: Never true    Ran Out of Food in the Last Year: Never true  Transportation Needs: No Transportation Needs (05/09/2018)   PRAPARE - Hydrologist (Medical): No    Lack of Transportation (Non-Medical): No  Physical Activity: Inactive (05/09/2018)   Exercise Vital Sign    Days of Exercise per Week: 0 days    Minutes of Exercise per Session: 0 min  Stress: No Stress Concern Present (05/09/2018)   [place]    Feeling of Stress : Only a little  Social Connections: Unknown (05/09/2018)   Social Connection and Isolation Panel [NHANES]    Frequency  of Communication with Friends and Family: Once a week    Frequency of Social Gatherings with Friends and Family: Once a week    Attends Religious Services: Patient refused    Marine scientist or Organizations: No    Attends Music therapist: Never    Marital Status: Separated   Past Surgical History:  Procedure Laterality Date   ABDOMINOPLASTY     CHOLECYSTECTOMY     Past Surgical History:  Procedure Laterality Date   ABDOMINOPLASTY     CHOLECYSTECTOMY     Past Medical History:  Diagnosis Date   DEPRESSION/ANXIETY 08/08/2009    Diabetes mellitus without complication ([place])    IBS 02/02/2010   Neuropathy    Polyneuropathy 12/04/2010   Renal stones    Sleep apnea 05/2021   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  Opioid Risk Score:   Fall Risk Score:  `1  Depression screen PHQ 2/9     03/27/2022    3:30 PM 02/07/2022    2:40 PM 02/06/2022   11:10 AM 01/15/2022    3:24 PM 07/18/2021    3:23 PM 06/19/2021    3:10 PM 05/19/2021    2:48 PM  Depression screen PHQ 2/9  Decreased Interest 0 1 1 2 $ 0 1 3  Down, Depressed, Hopeless 0 1 1 2 $ 0 1 3  PHQ - 2 Score 0 2 2 4 $ 0 2 6  Altered sleeping   3 3     Tired, decreased energy   3 3     Change in appetite   0 0     Feeling bad or failure about yourself    2 3     Trouble concentrating   0 1     Moving slowly or fidgety/restless   0 0     Suicidal thoughts   0 0     PHQ-9 Score   10 14       Review of Systems  Musculoskeletal:  Positive for gait problem.       B/L arm pain  All other systems reviewed and are negative.     Objective:   Physical Exam Vitals and nursing note reviewed.  Constitutional:      Appearance: Normal appearance.  Cardiovascular:     Rate and Rhythm: Normal rate and regular rhythm.     Pulses: Normal pulses.     Heart sounds: Normal heart sounds.  Pulmonary:     Effort: Pulmonary effort is normal.     Breath sounds: Normal breath sounds.  Musculoskeletal:     Cervical back: Normal range of motion and neck supple.     Comments: Normal Muscle Bulk and Muscle Testing Reveals:  Upper Extremities: Full ROM and Muscle Strength 5/5  Lower Extremities: Full ROM and Muscle Strength 5/5 Arises from Table with ease using cane for support Narrow Based  Gait     Skin:    General: Skin is warm and dry.  Neurological:     Mental Status: She is alert and oriented to person, place, and time.  Psychiatric:         Mood and Affect: Mood normal.        Behavior: Behavior normal.         Assessment & Plan:  1.Severe peripheral polyneuropathy of undetermined etiology.: 05/02/2022 Refilled: MS Contin 30 mg one tablet every 12 hours #  60 and  MSIR 30 mg one tablet at HS, may take a tablet during the day as needed #60.We will continue the opioid monitoring program, this consists of regular clinic visits, examinations, urine drug screen, pill counts as well as use of [place] Controlled Substance Reporting system. A 12 month History has been reviewed on the [place] Controlled Substance Reporting System on 03/27/2022 2. Bilateral progressive upper extremity hand pain/ Polyneuropathy. Continue with current medication regime. Tight Glucose Control. Adhere to healthy diet regime. Continue with current medication regimen with nortriptyline: 05/02/2022 3. Muscle Spasms: Continue current medication regimen with  Zanaflex. Continue to Monitor.  05/02/2022 4. Depression/Anxiety: Continue current medication regimen with Paxil and  Klonopin. Continue to Monitor. 05/02/2022  5. Bilateral Chronic Knee Pain: No complaints Today.Continue HEP as Tolerated and Continue to Monitor. 05/02/2022.

## 2022-05-01 NOTE — Telephone Encounter (Signed)
Attempted to call patient RE blood sugars on CGM.   Left message offering option to return my call.  Provided direct phone line #  [phone]

## 2022-05-02 ENCOUNTER — Encounter: Payer: Self-pay | Admitting: Registered Nurse

## 2022-05-02 ENCOUNTER — Encounter: Payer: Medicaid Other | Attending: Physical Medicine & Rehabilitation | Admitting: Registered Nurse

## 2022-05-02 VITALS — BP 145/82 | HR 94 | Ht 72.0 in | Wt 270.0 lb

## 2022-05-02 DIAGNOSIS — Z79891 Long term (current) use of opiate analgesic: Secondary | ICD-10-CM | POA: Insufficient documentation

## 2022-05-02 DIAGNOSIS — G629 Polyneuropathy, unspecified: Secondary | ICD-10-CM | POA: Diagnosis not present

## 2022-05-02 DIAGNOSIS — E1140 Type 2 diabetes mellitus with diabetic neuropathy, unspecified: Secondary | ICD-10-CM | POA: Diagnosis not present

## 2022-05-02 DIAGNOSIS — Z5181 Encounter for therapeutic drug level monitoring: Secondary | ICD-10-CM | POA: Diagnosis not present

## 2022-05-02 DIAGNOSIS — G894 Chronic pain syndrome: Secondary | ICD-10-CM | POA: Insufficient documentation

## 2022-05-02 DIAGNOSIS — M62838 Other muscle spasm: Secondary | ICD-10-CM | POA: Insufficient documentation

## 2022-05-02 DIAGNOSIS — F341 Dysthymic disorder: Secondary | ICD-10-CM | POA: Diagnosis not present

## 2022-05-02 MED ORDER — NORTRIPTYLINE HCL 50 MG PO CAPS: ORAL_CAPSULE | ORAL | 3 refills | Status: DC

## 2022-05-02 MED ORDER — MORPHINE SULFATE ER 30 MG PO TBCR: 30.0000 mg | EXTENDED_RELEASE_TABLET | Freq: Two times a day (BID) | ORAL | 0 refills | Status: DC

## 2022-05-02 MED ORDER — MORPHINE SULFATE 30 MG PO TABS: ORAL_TABLET | ORAL | 0 refills | Status: DC

## 2022-05-03 ENCOUNTER — Telehealth: Payer: Self-pay

## 2022-05-03 DIAGNOSIS — E118 Type 2 diabetes mellitus with unspecified complications: Secondary | ICD-10-CM

## 2022-05-03 MED ORDER — ACCU-CHEK GUIDE VI STRP: ORAL_STRIP | 12 refills | Status: DC

## 2022-05-03 MED ORDER — ACCU-CHEK GUIDE ME W/DEVICE KIT: PACK | 0 refills | Status: DC

## 2022-05-03 MED ORDER — ACCU-CHEK SOFTCLIX LANCETS MISC: 12 refills | Status: AC

## 2022-05-03 MED ORDER — INSULIN GLARGINE 100 UNIT/ML SOLOSTAR PEN: 10.0000 [IU] | PEN_INJECTOR | Freq: Every day | SUBCUTANEOUS | 11 refills | Status: DC

## 2022-05-03 NOTE — Telephone Encounter (Signed)
I printed what would be needed to do the setup and processed for new pap setup. The face 2 face notes may be an issue since they are quire old,  but with this insurance we may be okay. I will keep you posted if I hear anything.  Thank you,  Demetrius Charity

## 2022-05-03 NOTE — Addendum Note (Signed)
Addended by: Pauletta Browns on: 05/03/2022 05:24 PM   Modules accepted: Orders

## 2022-05-03 NOTE — Telephone Encounter (Signed)
Community message sent to [employer]. 

## 2022-05-03 NOTE — Telephone Encounter (Signed)
Attempted to contact patient for LIBERATE 7 day phone call follow up. Unable to reach patient and a HIPAA compliant voicemail was left requesting patient return my call.   Provided [facility] Pharmacist direct line.   Joseph Art, Pharm.D. PGY-2 Ambulatory Care Pharmacy Resident 05/03/2022 2:12 PM

## 2022-05-03 NOTE — Telephone Encounter (Addendum)
LIBERATE Study  Patient completed first study visit for the LIBERATE CGM Study. Contacted patient to discuss CGM tolerability. Confirmed HIPAA identifiers.   '@CGMFLO'$ @  Reports concerns with Libre 3 sensors. Initial sensor stopped transmitting signal. Patient replaced sensor without issue.  - Confirmed second study visit on 05/10/2022. Confirmed patient has transportation to this appointment.  - Discussed use of MyChart in this study. Confirmed patient has an active MyChart account and is aware of their log in information.  Patient aware of pre-visit questionnaire that will be sent 2 days prior to their scheduled study visit and they will plan to complete before the visit.   Patient reported blood sugar in the 300s and asked if she needed to go to the ED. Reports fasting blood sugar in 150s. Patient agreeable to starting insulin. Discussed with attending, Dr. Gwendlyn Deutscher, and she is in agreement.  -Start Lantus (insulin glargine) 10 units daily. Increase by 1 unit each day if blood sugar >150 until she is seen next week.   Joseph Art, Pharm.D. PGY-2 Ambulatory Care Pharmacy Resident 05/03/2022 5:23 PM   Joseph Art, Pharm.D. PGY-2 Ambulatory Care Pharmacy Resident 05/03/2022 5:13 PM

## 2022-05-04 ENCOUNTER — Telehealth: Payer: Self-pay | Admitting: Pharmacist

## 2022-05-04 ENCOUNTER — Encounter: Payer: Self-pay | Admitting: Registered Nurse

## 2022-05-04 DIAGNOSIS — E118 Type 2 diabetes mellitus with unspecified complications: Secondary | ICD-10-CM

## 2022-05-04 MED ORDER — INSULIN GLARGINE 100 UNIT/ML SOLOSTAR PEN: 10.0000 [IU] | PEN_INJECTOR | Freq: Every day | SUBCUTANEOUS | 2 refills | Status: DC

## 2022-05-04 NOTE — Telephone Encounter (Signed)
Patient called and reported that she was unable to obtain lantus insulin from her pharmacy as they were [place].    I offered to call to another pharmacy.  Following call to pharmacy stock was found at [place].   New prescription sent.  Starting dose 10 units.  Plans to titrate by 1-2 units each AM.  Plan to see patient in clinic in 6 days.

## 2022-05-07 ENCOUNTER — Telehealth: Payer: Self-pay | Admitting: Registered Nurse

## 2022-05-07 ENCOUNTER — Encounter: Payer: Self-pay | Admitting: Student

## 2022-05-07 DIAGNOSIS — G4733 Obstructive sleep apnea (adult) (pediatric): Secondary | ICD-10-CM

## 2022-05-07 MED ORDER — MORPHINE SULFATE 30 MG PO TABS: ORAL_TABLET | ORAL | 0 refills | Status: DC

## 2022-05-07 NOTE — Telephone Encounter (Signed)
Received a call from Ms. Iadarola, reporting problem with her prescription. [facility] was called and I spoke with pharmacist. PMP was reviewed.  New prescription for MSIR sent to pharmacy. Spoke with pharmacist . He received the prescription. Ms. Goetting will call the pharmacy to check to see when her prescription is available.

## 2022-05-08 ENCOUNTER — Telehealth: Payer: Self-pay

## 2022-05-08 DIAGNOSIS — G4733 Obstructive sleep apnea (adult) (pediatric): Secondary | ICD-10-CM | POA: Insufficient documentation

## 2022-05-08 NOTE — Telephone Encounter (Signed)
PA submitted for Morphine IR

## 2022-05-09 NOTE — Telephone Encounter (Signed)
PA response back from pharmacy. We received a prior authorization request for the member and product listed above. [facility] PA team is not able to review this request because the requested product has been previously approved under [id number] Based on the information reviewed, the requested prescription is currently authorized for coverage by the plan until *07/11/2022*. Please resubmit this request within 30 days of authorization expiration date. Pharmacy notified.

## 2022-05-10 ENCOUNTER — Ambulatory Visit (INDEPENDENT_AMBULATORY_CARE_PROVIDER_SITE_OTHER): Payer: Medicaid Other | Admitting: Pharmacist

## 2022-05-10 VITALS — BP 116/68 | HR 100 | Wt 273.6 lb

## 2022-05-10 DIAGNOSIS — F325 Major depressive disorder, single episode, in full remission: Secondary | ICD-10-CM | POA: Diagnosis not present

## 2022-05-10 DIAGNOSIS — E118 Type 2 diabetes mellitus with unspecified complications: Secondary | ICD-10-CM

## 2022-05-10 MED ORDER — INSULIN GLARGINE 100 UNIT/ML SOLOSTAR PEN: 30.0000 [IU] | PEN_INJECTOR | Freq: Every day | SUBCUTANEOUS | 2 refills | Status: DC

## 2022-05-10 MED ORDER — OZEMPIC (0.25 OR 0.5 MG/DOSE) 2 MG/3ML ~~LOC~~ SOPN: 0.5000 mg | PEN_INJECTOR | SUBCUTANEOUS | 0 refills | Status: DC

## 2022-05-10 NOTE — Patient Instructions (Addendum)
  Diet Recommendations for Diabetes  Carbohydrate includes starch, sugar, and fiber.  Of these, only sugar and starch raise blood glucose.  (Fiber is found in fruits, vegetables [especially skin, seeds, and stalks], whole grains, and beans.)   Starchy (carb) foods: Bread, rice, pasta, potatoes, corn, cereal, grits, crackers, bagels, muffins, all baked goods.  (Fruit, milk, and yogurt also have carbohydrate, but most of these foods will not spike your blood sugar as most starchy or sweet foods will.)  A few fruits do cause high blood sugars; use small portions of bananas (limit to 1/2 at a time), grapes, watermelon, and oranges.   Protein foods: Meat, fish, poultry, eggs, dairy foods, and beans such as pinto and kidney beans (beans also provide carbohydrate).   1. Eat at least 3 REAL meals and 1-2 snacks per day.  Eat breakfast within one hour of getting up.  Have something to eat at least every 5 hours while awake.  - A REAL meal for lunch or dinner includes at least some protein, some starch, and vegetables and/or fruit.   - A REAL breakfast needs to include both starch and protein foods.     2. Limit starchy foods to TWO portions per meal and ONE per snack. ONE portion of a starchy food is equal to the following:  - ONE slice of bread (or its equivalent, such as half of a hamburger bun).  - 1/2 cup of a "scoopable" starchy food such as potatoes or rice.  - 15 grams of Total Carbohydrate as shown on food label.  - 4 ounces of a sweet drink (including fruit juice).  3. Include twice the volume of vegetables as protein or carbohydrate foods in at least 14 meals per week.  - Fresh or frozen vegetables are best.  - Keep frozen vegetables on hand for a quick option.        Each teaspoon of sugar = 4 g of sugar Eat three meals a day; eat within the first hour of waking You need carbohydrates to live. As someone with diabetes, you simply need to watch your carbohydrates closer. Do not exceed 2  servings of carbs on each meal. One serving of carbs looks like 1 slice of bread, or a half cup of rice, pasta etc.  Medication changes:  -Take Victoza (dulaglutide) 1.8 daily injection until gone THEN... -Start Ozempic (semaglutide) 0.5 mg weekly injection  -Increase Lantus (insulin glargine) 30-40 units daily. START at 30 units daily, THEN titrate up 1 unit daily if fasting is >125 mg/dL until you hit 40 units.  -STOP titration if fasting sugars are consistently <125 mg/dL

## 2022-05-10 NOTE — Progress Notes (Signed)
S:    Chief Complaint  Patient presents with   Medication Management    Diabetes - CGM   52 y.o. female who presents for diabetes evaluation, education, and management.  PMH is significant for T2DM, chronic pain, hypertriglyceridemia, HTN.  Patient was referred and last seen by Primary Care Provider, Dr. Ronnald Ramp, on 04/16/2022. Last spoke with pharmacy clinic via phone on 05/04/2022 where patient was reporting blood sugar in the 300s and Lantus 10 units + 2 units each day if blood sugar >150 mg/dL.  Today, patient arrives in good spirits and ambulates with a cane. Reports CGM has been going well, but she has had some difficulty keeping the CGM adhered. Provided Tegaderm and Skin Tac today.   Current diabetes medications include: Lantus (insulin glargine) 24 units daily, Victoza (liraglutide) 1.8 mg daily, glipizide 10 mg TID, metformin 1000 mg BID Current hypertension medications include: enalapril 10 mg daily Current hyperlipidemia medications include: atorvastatin 80 mg daily, fenofibrate 145 mg daily  Patient reports adherence to taking all medications as prescribed.   Insurance coverage: Medicaid   Patient denies hypoglycemic events.  Patient reports nocturia (nighttime urination).  Patient reports neuropathy (nerve pain).  Patient reported dietary habits: Eats 2 meals/day Breakfast: slimfast shake Lunch: mock meat, veggies, pasta Dinner: cereal  Snacks: raw veggies Drinks: diet sodas, diet green tea, flavored water (zero sugar)  Patient-reported exercise habits: Patient has a stationary bike and tries to use it for 20 minutes a day. However she has chronic pain and lives a relatively sedentary lifestyle.   O:  Review of Systems  All other systems reviewed and are negative.   Physical Exam Constitutional:      Appearance: Normal appearance.  Pulmonary:     Effort: Pulmonary effort is normal.  Neurological:     Mental Status: She is alert.  Psychiatric:        Mood  and Affect: Mood normal.        Behavior: Behavior normal.        Thought Content: Thought content normal.    CGM Download:  % Time CGM is active: 97% Average Glucose: 240 mg/dL Glucose Management Indicator: 9.1%  Glucose Variability: 20.9% (goal <36%) Time in Goal:  - Time in range 70-180: 11% - Time above range: 91% - high: 52%; very high: 37% - Time below range: 0% Observed patterns: consistently elevated readings, patient reports "third shift" sleep schedule and CGM reports reflects that.    Lab Results  Component Value Date   HGBA1C 9.9 (A) 04/26/2022   There were no vitals filed for this visit.  Lipid Panel     Component Value Date/Time   CHOL 130 01/15/2022 1709   TRIG 403 (H) 01/15/2022 1709   HDL 38 (L) 01/15/2022 1709   CHOLHDL 3.4 01/15/2022 1709   CHOLHDL 4.6 07/18/2015 1630   VLDL NOT CALC 07/18/2015 1630   LDLCALC 34 01/15/2022 1709   LDLDIRECT 35 06/14/2011 1555    Clinical Atherosclerotic Cardiovascular Disease (ASCVD): No  The 10-year ASCVD risk score (Arnett DK, et al., 2019) is: 5.4%   Values used to calculate the score:     Age: 30 years     Sex: Female     Is Non-Hispanic African American: No     Diabetic: Yes     Tobacco smoker: No     Systolic Blood Pressure: [id number] mmHg     Is BP treated: Yes     HDL Cholesterol: 38 mg/dL  Total Cholesterol: 130 mg/dL   Patient is participating in a Managed Medicaid Plan:  Yes   A/P: Diabetes longstanding currently uncontrolled based on A1c, but improving per CGM report. Patient is able to verbalize appropriate hypoglycemia management plan. Medication adherence appears appropriate. Control is suboptimal due to needing therapy intensification. -Increased dose of basal insulin Lantus (insulin glargine) from 24 units to 30 units daily in the morning. Patient will continue to titrate 1 unit every day if fasting blood sugar > 125 mg/dl OR to a maximum of 40 units daily. -Discontinued GLP-1 Victoza (liraglutide)  once she has run out of supply (~10 days) -Start GLP-1 Ozempic (semaglutide) 0.5 mg weekly once she is out of Victoza. 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  -Discontinue glipizide 10 mg TID. -Continued metformin 1000 mg BID.  -Provided Tegaderm and Skin Tac to help CGM adhere.  -Patient educated on purpose, proper use, and potential adverse effects of Ozempic, insulin, and metformin.  -Extensively discussed pathophysiology of diabetes, recommended lifestyle interventions, dietary effects on blood sugar control.  -Counseled on s/sx of and management of hypoglycemia.  -Next A1c anticipated 2 months.   Discussed options for Chronic Neuropathic Pain / Depression / OCD and Anxiety that could be options to replace paroxetine.  Patient desires to come off of paroxetine however she has had tremendous difficulty with tapering off in the past.   -No changes today.  Encouraged patient to continue the nortriptyline and paroxetine at this time. -I agreed to discuss treatment cross-titration options with her PCP, Dr. Ronnald Ramp.  -Several drug therapy options include an alternate SSRI with OCD indication, Duloxetine for potential improvement with chronic pain.     Follow-up:  Pharmacist March 2024. Patient seen with Dixon Boos,  PharmD Candidate, Francena Hanly, PharmD PGY-1 Pharmacy Resident and Joseph Art, PharmD, PGY2 Pharmacy Resident

## 2022-05-10 NOTE — Assessment & Plan Note (Signed)
Discussed options for Chronic Neuropathic Pain / Depression / OCD and Anxiety that could be options to replace paroxetine.  Patient desires to come off of paroxetine however she has had tremendous difficulty with tapering off in the past.   -No changes today.  Encouraged patient to continue the nortriptyline and paroxetine at this time. -I agreed to discuss treatment cross-titration options with her PCP, Dr. Ronnald Ramp.  -Several drug therapy options include an alternate SSRI with OCD indication, Duloxetine for potential improvement with chronic pain.

## 2022-05-16 ENCOUNTER — Ambulatory Visit: Payer: Medicaid Other | Admitting: Podiatry

## 2022-05-17 ENCOUNTER — Telehealth: Payer: Self-pay

## 2022-05-17 NOTE — Telephone Encounter (Signed)
Received paper order in RN box for CPAP machine. Community message sent to [employer]. Will await response.   Hard copy placed at my desk if needed.   Talbot Grumbling, RN

## 2022-05-22 ENCOUNTER — Telehealth: Payer: Self-pay | Admitting: Pharmacist

## 2022-05-22 DIAGNOSIS — E118 Type 2 diabetes mellitus with unspecified complications: Secondary | ICD-10-CM

## 2022-05-22 MED ORDER — SEMAGLUTIDE (1 MG/DOSE) 4 MG/3ML ~~LOC~~ SOPN: 1.0000 mg | PEN_INJECTOR | SUBCUTANEOUS | 1 refills | Status: DC

## 2022-05-22 NOTE — Telephone Encounter (Signed)
Received phone call message from 4 am 05/22/22. Patient reported persistent high levels and was wondering if she could go up on her Ozempic (semaglutide) from 0.5 mg to 1 mg weekly. We called back at 2 pm 2/27 and told her to take double dose of the 0.5 mg to achieve 1 mg dose of Ozempic (semaglutide). Patient instructed to keep insulin the same for now until f/u in 2 weeks. New Rx for Ozempic (semaglutide) 1 mg weekly injection sent to her pharmacy.

## 2022-05-23 ENCOUNTER — Other Ambulatory Visit: Payer: Self-pay | Admitting: Student

## 2022-05-23 NOTE — Telephone Encounter (Signed)
Reviewed and agree with Dr Graylin Shiver plan.

## 2022-05-26 ENCOUNTER — Other Ambulatory Visit: Payer: Self-pay | Admitting: Registered Nurse

## 2022-05-26 ENCOUNTER — Other Ambulatory Visit: Payer: Self-pay | Admitting: Student

## 2022-05-26 DIAGNOSIS — E118 Type 2 diabetes mellitus with unspecified complications: Secondary | ICD-10-CM

## 2022-05-29 ENCOUNTER — Other Ambulatory Visit: Payer: Self-pay | Admitting: Registered Nurse

## 2022-05-29 DIAGNOSIS — F341 Dysthymic disorder: Secondary | ICD-10-CM

## 2022-05-29 NOTE — Telephone Encounter (Signed)
PMP was Reviewed.  Clonazepam e-scribed today Call placed to Ms. Rinn, she verbalizes understanding.

## 2022-05-31 ENCOUNTER — Encounter: Payer: Medicaid Other | Admitting: Registered Nurse

## 2022-05-31 ENCOUNTER — Telehealth: Payer: Self-pay

## 2022-05-31 NOTE — Telephone Encounter (Signed)
Attempted to contact patient regarding a voicemail she left on Dr. Graylin Shiver line regarding Ozempic dosing.   Patient was wondering if she would be able to increase to 1.5 mg weekly. Left voicemail (DPR on file) explaining that we would like her to stay on 1 mg weekly as there is not a 1.5 mg dose. Also, given her neuropathy, we would like to avoid rapid blood glucose control as this may exacerbate neuropathic symptoms. Printed patient's CGM report.   % Time CGM is active: 94% Average Glucose: 204 mg/dL Glucose Management Indicator: 8.2  Glucose Variability: 19.1 (goal <36%) Time in Goal:  - Time in range 70-180: 27% - Time above range: 73% - Time below range: 0%  Provided patient call back number if she would like to further discuss her CGM report or has difficulty obtaining Ozempic.   Follow up with Dr. Valentina Lucks scheduled for 06/07/2022.  Joseph Art, Pharm.D. PGY-2 Ambulatory Care Pharmacy Resident

## 2022-06-01 ENCOUNTER — Telehealth: Payer: Self-pay

## 2022-06-01 ENCOUNTER — Encounter (HOSPITAL_COMMUNITY): Payer: Self-pay | Admitting: *Deleted

## 2022-06-01 ENCOUNTER — Ambulatory Visit (INDEPENDENT_AMBULATORY_CARE_PROVIDER_SITE_OTHER): Payer: Medicaid Other

## 2022-06-01 ENCOUNTER — Ambulatory Visit (HOSPITAL_COMMUNITY)
Admission: EM | Admit: 2022-06-01 | Discharge: 2022-06-01 | Disposition: A | Discharge status: Home / Self Care | Payer: Medicaid Other | Attending: Emergency Medicine | Admitting: Emergency Medicine

## 2022-06-01 DIAGNOSIS — E1159 Type 2 diabetes mellitus with other circulatory complications: Secondary | ICD-10-CM | POA: Diagnosis present

## 2022-06-01 DIAGNOSIS — L089 Local infection of the skin and subcutaneous tissue, unspecified: Secondary | ICD-10-CM | POA: Insufficient documentation

## 2022-06-01 DIAGNOSIS — L03115 Cellulitis of right lower limb: Secondary | ICD-10-CM | POA: Diagnosis not present

## 2022-06-01 DIAGNOSIS — S91301A Unspecified open wound, right foot, initial encounter: Secondary | ICD-10-CM | POA: Diagnosis not present

## 2022-06-01 DIAGNOSIS — T148XXA Other injury of unspecified body region, initial encounter: Secondary | ICD-10-CM | POA: Diagnosis present

## 2022-06-01 DIAGNOSIS — M79671 Pain in right foot: Secondary | ICD-10-CM

## 2022-06-01 DIAGNOSIS — E11628 Type 2 diabetes mellitus with other skin complications: Secondary | ICD-10-CM

## 2022-06-01 LAB — CBC
HCT: 44.9 % (ref 36.0–46.0)
Hemoglobin: 14.2 g/dL (ref 12.0–15.0)
MCH: 26.8 pg (ref 26.0–34.0)
MCHC: 31.6 g/dL (ref 30.0–36.0)
MCV: 84.7 fL (ref 80.0–100.0)
Platelets: 313 10*3/uL (ref 150–400)
RBC: 5.3 MIL/uL — ABNORMAL HIGH (ref 3.87–5.11)
RDW: 13.9 % (ref 11.5–15.5)
WBC: 7.1 10*3/uL (ref 4.0–10.5)
nRBC: 0 % (ref 0.0–0.2)

## 2022-06-01 LAB — BASIC METABOLIC PANEL
Anion gap: 10 (ref 5–15)
BUN: 18 mg/dL (ref 6–20)
CO2: 27 mmol/L (ref 22–32)
Calcium: 10.1 mg/dL (ref 8.9–10.3)
Chloride: 101 mmol/L (ref 98–111)
Creatinine, Ser: 0.67 mg/dL (ref 0.44–1.00)
GFR, Estimated: >60 mL/min (ref >60)
Glucose, Bld: 153 mg/dL — ABNORMAL HIGH (ref 70–99)
Potassium: 4.8 mmol/L (ref 3.5–5.1)
Sodium: 138 mmol/L (ref 135–145)

## 2022-06-01 MED ORDER — SULFAMETHOXAZOLE-TRIMETHOPRIM 800-160 MG PO TABS: 1.0000 | ORAL_TABLET | Freq: Two times a day (BID) | ORAL | 0 refills | Status: AC

## 2022-06-01 MED ORDER — CEPHALEXIN 500 MG PO CAPS: 500.0000 mg | ORAL_CAPSULE | Freq: Four times a day (QID) | ORAL | 0 refills | Status: AC

## 2022-06-01 NOTE — Discharge Instructions (Addendum)
Please keep your scheduled appointment with podiatry. I have sent a referral to the wound clinic, please call and make an appointment if you do not hear anything around Wednesday, and let them know you have been referred by the UC.   Please start antibiotics today and take as prescribed until finished.  You can take with food to help prevent GI upset.  Please keep your wound clean and dry.  You can apply a nonstick dressing.   Please seek immediate care at the nearest ED if you develop fever, worsening of cellulitis, progression of your redness up your leg, or any changes.

## 2022-06-01 NOTE — ED Provider Notes (Signed)
[facility]    CSN: [id number] Arrival date & time: 06/01/22  1418      History   Chief Complaint Chief Complaint  Patient presents with   Wound Check    HPI [name] is a 52 y.o. female.   Patient presents to clinic for wounds to the toes her right foot. These have been ongoing, initially had a blood blister that popped.  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 Denies fevers.   She has an appointment with podiatry early next week and 1 with the wound clinic April 1.  Please see media for photographs.   The history is provided by the patient.  Wound Check Pertinent negatives include no chest pain.    Past Medical History:  Diagnosis Date   DEPRESSION/ANXIETY 08/08/2009   Diabetes mellitus without complication (HCC)    IBS 02/02/2010   Neuropathy    Polyneuropathy 12/04/2010   Renal stones    Sleep apnea 05/2021   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) 08/08/2009    Patient Active Problem List   Diagnosis Date Noted   Obstructive sleep apnea 05/08/2022   Postmenopausal bleeding 01/15/2022   Chronic tension-type headache, not intractable 06/01/2021   Tinea corporis 07/06/2020   Tachycardia 01/19/2020   Type 2 diabetes mellitus with ophthalmic complication ([place]) 0000000   History of vitamin D deficiency 05/10/2019   Vegetarian diet 03/25/2017   Hypertension associated with type 2 diabetes mellitus ([place]) 08/14/2012   Chronic pain 04/23/2011   Obesity (BMI 30-39.9) 02/10/2011   Hypertriglyceridemia 12/06/2010   Neuropathy due to type 2 diabetes mellitus ([place]) 12/04/2010   IBS 02/02/2010   Type 2 diabetes mellitus with complication, without long-term current use of insulin ([place]) 08/08/2009   Depression, major, 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      Home Medications    Prior to Admission medications   Medication Sig Start Date End Date Taking? Authorizing Provider  Accu-Chek Softclix Lancets lancets Use as instructed 05/03/22  Yes Kinnie Feil, MD  aspirin 81 MG tablet Take 81 mg by mouth daily.   Yes [provider]  atorvastatin (LIPITOR) 80 MG tablet TAKE 1 TABLET(80 MG) BY MOUTH DAILY 05/23/22  Yes [name], DO  Blood Glucose Monitoring Suppl (ACCU-CHEK GUIDE ME) w/Device KIT Use to check blood sugar TID. 05/03/22  Yes Eniola, Phill Myron, MD  cephALEXin (KEFLEX) 500 MG capsule Take 1 capsule (500 mg total) by mouth 4 (four) times daily for 10 days. 06/01/22 06/11/22 Yes Louretta Shorten, [place] N, FNP  clonazePAM (KLONOPIN) 0.5 MG tablet TAKE 3 TABLETS BY MOUTH AT BEDTIME AS NEEDED 05/29/22  Yes Danella Sensing L, NP  enalapril (VASOTEC) 10 MG tablet TAKE 1 TABLET BY MOUTH DAILY 12/05/21  Yes [name], DO  fenofibrate (TRICOR) 145 MG tablet TAKE 1 TABLET(145 MG) BY MOUTH DAILY 11/16/21  Yes [name], DO  glucose blood (ACCU-CHEK GUIDE) test strip Use as instructed 05/03/22  Yes Eniola, Tawanna Solo T, MD  insulin glargine (LANTUS) 100 UNIT/ML Solostar Pen Inject 30-40 Units into the skin daily. 05/10/22  Yes Hensel, Jamal Collin, MD  Insulin Pen Needle (BD PEN NEEDLE NANO 2ND GEN) 32G X 4 MM MISC USE TO INJECT INSULIN DAILY Strength: 32G X 4 MM 05/10/21  Yes [name], DO  Lancets Misc. (ACCU-CHEK SOFTCLIX LANCET DEV) KIT 1 applicator by Does not apply route in the morning, at noon, and at bedtime. 05/12/19  Yes Mullis, Kiersten P, DO  metFORMIN (GLUCOPHAGE) 1000 MG tablet TAKE 1 TABLET(1000 MG) BY MOUTH TWICE DAILY WITH A MEAL 05/28/22  Yes [name], DO  morphine (MS CONTIN) 30 MG 12 hr tablet Take 1 tablet (30 mg total) by mouth every 12 (twelve) hours. 05/02/22  Yes Bayard Hugger, NP  morphine (MSIR) 30 MG tablet One tablet at HS, may take a tablet during the day. 05/07/22  Yes Bayard Hugger,  NP  Multiple Vitamin (MULTIVITAMIN) tablet Take 1 tablet by mouth daily.   Yes [provider]  nortriptyline (PAMELOR) 50 MG capsule TAKE 1 CAPSULE(50 MG) BY MOUTH AT BEDTIME 05/26/22  Yes Kirsteins, Luanna Salk, MD  PARoxetine (PAXIL) 40 MG tablet TAKE 1 TABLET(40 MG) BY MOUTH DAILY 10/12/21  Yes [name], DO  Semaglutide, 1 MG/DOSE, 4 MG/3ML SOPN Inject 1 mg into the skin once a week. 05/22/22  Yes Leavy Cella, RPH-CPP  sulfamethoxazole-trimethoprim (BACTRIM DS) 800-160 MG tablet Take 1 tablet by mouth 2 (two) times daily for 10 days. 06/01/22 06/11/22 Yes Louretta Shorten, [place] N, FNP  tiZANidine (ZANAFLEX) 2 MG tablet TAKE 1 TABLET(2 MG) BY MOUTH THREE TIMES DAILY 01/11/22  Yes Kirsteins, Luanna Salk, MD  VITAMIN D, CHOLECALCIFEROL, PO Take 2,400 Units by mouth daily.   Yes [provider]    Family History Family History  Problem Relation Age of Onset   Fibromyalgia Mother    Aortic aneurysm Mother    Stroke Maternal Grandmother    Colon cancer Maternal Grandmother    Heart Problems Paternal Grandmother    Dementia Paternal Grandmother    Heart attack Paternal Grandfather     Social History Social History   Tobacco Use   Smoking status: Never   Smokeless tobacco: Never  Vaping Use   Vaping Use: Never used  Substance Use Topics   Alcohol use: No    Alcohol/week: 0.0 standard drinks of alcohol   Drug use: No    Comment: rx opiods     Allergies   Jardiance [empagliflozin] and Vancomycin   Review of Systems Review of Systems  Constitutional:  Negative for fatigue and fever.  Respiratory:  Negative for cough.   Cardiovascular:  Negative for chest pain.  Skin:  Positive for color change and wound. Negative for rash.     Physical Exam Triage Vital Signs ED Triage Vitals  Enc Vitals Group     BP 06/01/22 1436 117/71     Pulse Rate 06/01/22 1436 88     Resp 06/01/22 1436 18     Temp 06/01/22 1436 99.1 F (37.3 C)     Temp Source 06/01/22 1436 Oral      SpO2 06/01/22 1436 98 %     Weight --      Height --      Head Circumference --      Peak Flow --      Pain Score 06/01/22 1435 0     Pain Loc --      Pain Edu? --      Excl. in [place]? --    No data found.  Updated Vital Signs BP 117/71 (BP Location: Right Arm)   Pulse 88   Temp 99.1 F (37.3 C) (Oral)   Resp 18   LMP 05/06/2018   SpO2 98%  Visual Acuity Right Eye Distance:   Left Eye Distance:   Bilateral Distance:    Right Eye Near:   Left Eye Near:    Bilateral Near:     Physical Exam Vitals and nursing note reviewed.  Constitutional:      General: She is not in acute distress.    Appearance: She is well-developed.  HENT:     Head: Normocephalic and atraumatic.     Mouth/Throat:     Mouth: Mucous membranes are moist.  Eyes:     Conjunctiva/sclera: Conjunctivae normal.  Cardiovascular:     Rate and Rhythm: Normal rate and regular rhythm.  Pulmonary:     Effort: Pulmonary effort is normal. No respiratory distress.  Musculoskeletal:        General: No swelling, tenderness, deformity or signs of injury. Normal range of motion.     Right lower leg: No edema.     Left lower leg: No edema.     Right foot: No swelling or deformity. Normal pulse.     Comments: Wounds to 2nd, 3rd and 4th distal phalanx of right foot, ulcer / sore to sole of first metatarsal. Pedal pulses 2+, brisk capillary refill. No edema. Erythema, without warmth.   Skin:    General: Skin is warm and dry.     Capillary Refill: Capillary refill takes less than 2 seconds.     Findings: Erythema present.  Neurological:     Mental Status: She is alert.  Psychiatric:        Mood and Affect: Mood normal.        Behavior: Behavior is cooperative.      UC Treatments / Results  Labs (all labs ordered are listed, but only abnormal results are displayed) Labs Reviewed  BASIC METABOLIC PANEL  CBC    EKG   Radiology DG Foot Complete Right  Result Date: 06/01/2022 CLINICAL DATA:  Right foot  wounds. EXAM: RIGHT FOOT COMPLETE - 3+ VIEW COMPARISON:  05/09/2018 FINDINGS: No fracture, dislocation or bony destruction. Stable mild hallux valgus and mild degenerative arthropathy interphalangeal joints of the toes. Soft tissues demonstrate no foreign body or soft tissue gas. IMPRESSION: No acute findings. Stable mild hallux valgus and mild degenerative arthropathy of the toes. Electronically Signed   By: Aletta Edouard M.D.   On: 06/01/2022 15:43    Procedures Procedures (including critical care time)  Medications Ordered in UC Medications - No data to display  Initial Impression / Assessment and Plan / UC Course  I have reviewed the triage vital signs and the nursing notes.  Pertinent labs & imaging results that were available during my care of the patient were reviewed by me and considered in my medical decision making (see chart for details).  Vitals and triage reviewed, patient is hemodynamically stable.  Multiple wounds to toes of right distal phalanx, open ulcerations. Brisk capillary refill, 2+ pedal pulses.  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  Without streaking or warmth. X-ray obtained to rule out osteomyelitis, which was negative.   Moderate infection severity, without systemic signs of tachycardia, fever, hypotension, or emesis.  Creatinine 0.51, GFR 112 4 months prior. Will cover for MRSA w/ Bactrim 2 tabs q 12 hours and cephalexin 500 mg q 6 hours x 10 days per UTD recommendations for diabetic infections. Hx of MRSA and staph.   Encouraged to keep f/u w/ podiatry and to schedule with the wound clinic, internal referral placed.   Strict emergency  precautions given, patient verbalized understanding.    Final Clinical Impressions(s) / UC Diagnoses   Final diagnoses:  Wound infection  Cellulitis of right lower leg  Type 2 diabetes mellitus with other circulatory complication, without long-term current use of insulin [facility])      Discharge Instructions      Please keep your scheduled appointment with podiatry. I have sent a referral to the wound clinic, please call and make an appointment if you do not hear anything around Wednesday, and let them know you have been referred by the UC.   Please start antibiotics today and take as prescribed until finished.  You can take with food to help prevent GI upset.  Please keep your wound clean and dry.  You can apply a nonstick dressing.   Please seek immediate care at the nearest ED if you develop fever, worsening of cellulitis, progression of your redness up your leg, or any changes.     ED Prescriptions     Medication Sig Dispense Auth. Provider   cephALEXin (KEFLEX) 500 MG capsule Take 1 capsule (500 mg total) by mouth 4 (four) times daily for 10 days. 40 capsule Louretta Shorten, [place] N, [place]   sulfamethoxazole-trimethoprim (BACTRIM DS) 800-160 MG tablet Take 1 tablet by mouth 2 (two) times daily for 10 days. 20 tablet Kayla Weekes, [place] N, [place]      I have reviewed the PDMP during this encounter.   Rohan Juenger, [place] N, [place] 06/01/22 1600

## 2022-06-01 NOTE — Telephone Encounter (Signed)
A Prior Authorization was initiated for this patients OZEMPIC '1MG'$  DOSE PENS through CoverMyMeds.   Key: [id number]

## 2022-06-01 NOTE — ED Triage Notes (Addendum)
Pt states she has had a wound on bottom and side of her right foot x 1.5 week. She is putting neosporin and wrapping it but she is worried she needs antibiotics. She does have an appt with the podiatrist Monday and wound center in April but wanted it checked prior to that.   Pt unwrapped her foot and there is wounds between her toes on her right foot.

## 2022-06-04 ENCOUNTER — Encounter: Payer: Self-pay | Admitting: Podiatry

## 2022-06-04 ENCOUNTER — Ambulatory Visit (INDEPENDENT_AMBULATORY_CARE_PROVIDER_SITE_OTHER): Payer: Medicaid Other | Admitting: Podiatry

## 2022-06-04 DIAGNOSIS — L97521 Non-pressure chronic ulcer of other part of left foot limited to breakdown of skin: Secondary | ICD-10-CM | POA: Diagnosis not present

## 2022-06-04 DIAGNOSIS — E1140 Type 2 diabetes mellitus with diabetic neuropathy, unspecified: Secondary | ICD-10-CM

## 2022-06-04 DIAGNOSIS — L97422 Non-pressure chronic ulcer of left heel and midfoot with fat layer exposed: Secondary | ICD-10-CM

## 2022-06-04 MED ORDER — MUPIROCIN 2 % EX OINT: 1.0000 | TOPICAL_OINTMENT | Freq: Two times a day (BID) | CUTANEOUS | 2 refills | Status: DC

## 2022-06-04 NOTE — Telephone Encounter (Signed)
Prior Auth for patients medication OZEMPIC approved by [employer] from 06/01/22 to 06/01/23.  CoverMyMeds Key: [id number]

## 2022-06-04 NOTE — Progress Notes (Addendum)
  Subjective:  Patient ID: [name], female    DOB: [birth year],   MRN: [id number]  Chief Complaint  Patient presents with   blisters     Right foot blisters     52 y.o. female presents for cocnern of right foot blisters and wounds. Relates they popped up about a week ago and was seen in urgent care as she could not get in sooner here. Relates she was given antibiotics and has been keeping neosporin on the areas. Relates they have been improving on the antibiotics . Denies any other pedal complaints. Denies n/v/f/c.   Past Medical History:  Diagnosis Date   DEPRESSION/ANXIETY 08/08/2009   Diabetes mellitus without complication (HCC)    IBS 02/02/2010   Neuropathy    Polyneuropathy 12/04/2010   Renal stones    Sleep apnea 05/2021   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) 08/08/2009    Objective:  Physical Exam: Vascular: DP/PT pulses 2/4 bilateral. CFT <3 seconds. Normal hair growth on digits. No edema.  Skin. No lacerations or abrasions bilateral feet.  Musculoskeletal: MMT 5/5 bilateral lower extremities in DF, PF, Inversion and Eversion. Deceased ROM in DF of ankle joint.  Neurological: Sensation intact to light touch.   Assessment:   1. Neuropathy due to type 2 diabetes mellitus ([place])   2. Skin ulcer of fourth toe, left, limited to breakdown of skin ([place])   3. Skin ulcer of third toe of left foot, limited to breakdown of skin ([place])   4. Ulcer of left midfoot with fat layer exposed ([place])      Plan:  Patient was evaluated and treated and all questions answered. Ulcer left third and fourth digits limited to breakdown of skin and plantar first metatrsal head on left with fat layer exposed  Reivewed noted and imaging from urgent care visit.  -Debridement as below. -Dressed with neosprin, DSD. Mupirocin prescribed.  -Off-loading with surgical shoe. Dispensed -Continue oral course   -Discussed glucose control and proper protein-rich diet.  -Discussed if any worsening redness, pain, fever or chills to call or may need to report to the emergency room. Patient expressed understanding.   Procedure: Excisional Debridement of Wound Rationale: Removal of non-viable soft tissue from the wound to promote healing.  Anesthesia: none Pre-Debridement Wound Measurements: Overlying callus and slough  Post-Debridement Wound Measurements: 0.3 cm x 0.2 cm x 0.2 cm (plantar first metatarsal head left ) 3 cm x 1 cm x 0.1 cm third digit left, 3 cm x 1 cm x  0.1 cm fourth digit left   Type of Debridement: Sharp Excisional Tissue Removed: Non-viable soft tissue Depth of Debridement: subcutaneous tissue. Technique: Sharp excisional debridement to bleeding, viable wound base.  Dressing: Dry, sterile, compression dressing. Disposition: Patient tolerated procedure well. Patient to return in 2 week for follow-up.  Return in about 2 weeks (around 06/18/2022) for wound check.   Lorenda Peck, DPM

## 2022-06-06 ENCOUNTER — Telehealth: Payer: Self-pay

## 2022-06-06 MED ORDER — MORPHINE SULFATE 30 MG PO TABS: ORAL_TABLET | ORAL | 0 refills | Status: DC

## 2022-06-06 NOTE — Telephone Encounter (Signed)
PMP reviewed MSIR e- scribed to pharmacy.  [name] is aware via My-Chart message

## 2022-06-07 ENCOUNTER — Ambulatory Visit: Payer: Medicaid Other | Admitting: Pharmacist

## 2022-06-08 ENCOUNTER — Telehealth: Payer: Self-pay | Admitting: Pharmacist

## 2022-06-08 NOTE — Telephone Encounter (Signed)
Contacted patient to discuss blood sugar results - CGM report is much improved.   Date review: 06/07/2022 (date of rescheduled appt) % Time CGM is active: 95% Average Glucose: 184 mg/dL Glucose Management Indicator: 7.7  Glucose Variability: 18.8 (goal <36%) Time in Goal:  - Time in range 70-180: 51% - Time above range: 49% with 45% in High - Time below range: 0% Observed patterns:  Overall improved with limited variability.   Patient reports decreased carbohydrate intake  Less cereal and breads.   Overall she feels better but is dealing with small wounds on her foot which are healing.  She is rescheduled to return to office in 1 week.

## 2022-06-08 NOTE — Telephone Encounter (Signed)
Reviewed and agree with Dr Koval's plan.   

## 2022-06-12 ENCOUNTER — Telehealth: Payer: Self-pay | Admitting: *Deleted

## 2022-06-12 ENCOUNTER — Ambulatory Visit: Payer: Medicaid Other | Admitting: Pharmacist

## 2022-06-12 ENCOUNTER — Telehealth: Payer: Self-pay | Admitting: Pharmacist

## 2022-06-12 ENCOUNTER — Encounter: Payer: Self-pay | Admitting: Registered Nurse

## 2022-06-12 ENCOUNTER — Encounter: Payer: Medicaid Other | Attending: Physical Medicine & Rehabilitation | Admitting: Registered Nurse

## 2022-06-12 VITALS — BP 124/75 | HR 108 | Ht 72.0 in | Wt 267.0 lb

## 2022-06-12 DIAGNOSIS — E1140 Type 2 diabetes mellitus with diabetic neuropathy, unspecified: Secondary | ICD-10-CM | POA: Insufficient documentation

## 2022-06-12 DIAGNOSIS — G629 Polyneuropathy, unspecified: Secondary | ICD-10-CM | POA: Diagnosis not present

## 2022-06-12 DIAGNOSIS — F341 Dysthymic disorder: Secondary | ICD-10-CM | POA: Diagnosis not present

## 2022-06-12 DIAGNOSIS — E118 Type 2 diabetes mellitus with unspecified complications: Secondary | ICD-10-CM

## 2022-06-12 DIAGNOSIS — G894 Chronic pain syndrome: Secondary | ICD-10-CM | POA: Diagnosis not present

## 2022-06-12 DIAGNOSIS — R000 Tachycardia, unspecified: Secondary | ICD-10-CM | POA: Diagnosis not present

## 2022-06-12 DIAGNOSIS — Z5181 Encounter for therapeutic drug level monitoring: Secondary | ICD-10-CM | POA: Insufficient documentation

## 2022-06-12 DIAGNOSIS — Z79891 Long term (current) use of opiate analgesic: Secondary | ICD-10-CM | POA: Insufficient documentation

## 2022-06-12 MED ORDER — INSULIN GLARGINE SOLOSTAR 100 UNIT/ML ~~LOC~~ SOPN
40.0000 [IU] | PEN_INJECTOR | Freq: Every day | SUBCUTANEOUS | 1 refills | Status: DC
  Filled 2022-06-12: qty 15, 37d supply, fill #0
  Filled 2022-07-15 – 2022-07-18 (×2): qty 15, 37d supply, fill #1

## 2022-06-12 MED ORDER — MORPHINE SULFATE 30 MG PO TABS: ORAL_TABLET | ORAL | 0 refills | Status: DC

## 2022-06-12 MED ORDER — MORPHINE SULFATE ER 30 MG PO TBCR: 30.0000 mg | EXTENDED_RELEASE_TABLET | Freq: Two times a day (BID) | ORAL | 0 refills | Status: DC

## 2022-06-12 NOTE — Telephone Encounter (Signed)
Phone call from patient stating insulin formulary agent was exchanged and the pharmacy needs a new prescription.   Contacted pharmacy - to clarify option that the pharmacy is requesting for basal insulin.  Patient has prescription for Lantus Solostar which appears to be covered on the [place] Medicaid PDL.   After a 15 minute wait I was able to determine that Lantus (glargine) is on "back-order" and an alternative is not able to be processed at the same [facility] price.   Then contacted [facility] across the street and similar response - no substitution available and none in stock.   Called [facility] outpatient pharmacy and determined they have 8 boxes in stock.    Contacted patient 5:47 PM and communicated availability.  She was happy to hear of availability, states she has picked up medications from that site previously.    New prescription provided.  Patient has appointment next week on Thursday 3/28

## 2022-06-12 NOTE — Telephone Encounter (Signed)
Urine drug screen for this encounter is consistent for prescribed medication 

## 2022-06-12 NOTE — Progress Notes (Signed)
Subjective:    Patient ID: [name], female    DOB: Jul 21, 1970, 52 y.o.   MRN: [id number]  HPI: [name] is a 52 y.o. female who returns for follow up appointment for chronic pain and medication refill. She states her pain is located in her bilateral hands and bilateral feet. She rates her pain 8. Her current exercise regime is walking and riding her stationary bicycle for 20 minutes twice a week.  [name] Morphine equivalent is 120.00 MME. She is also prescribed Clonazepam .We have discussed the black box warning of using opioids and benzodiazepines. I highlighted the dangers of using these drugs together and discussed the adverse events including respiratory suppression, overdose, cognitive impairment and importance of compliance with current regimen. We will continue to monitor and adjust as indicated.   Last UDS was Performed 03/27/2022, it was consistent.      Pain Inventory Average Pain 8 Pain Right Now 8 My pain is constant, sharp, burning, dull, stabbing, tingling, and aching  In the last 24 hours, has pain interfered with the following? General activity 8 Relation with others 8 Enjoyment of life 8 What TIME of day is your pain at its worst? morning  Sleep (in general) Poor  Pain is worse with: walking, bending, standing, and some activites Pain improves with: rest and medication Relief from Meds: 9  Family History  Problem Relation Age of Onset   Fibromyalgia Mother    Aortic aneurysm Mother    Stroke Maternal Grandmother    Colon cancer Maternal Grandmother    Heart Problems Paternal Grandmother    Dementia Paternal Grandmother    Heart attack Paternal Grandfather    Social History   Socioeconomic History   Marital status: Legally Separated    Spouse name: Not on file   Number of children: 1   Years of education: Not on file   Highest education level: Bachelor's degree (e.g., BA, AB, BS)  Occupational History   Occupation: Disabled   Tobacco Use   Smoking status: Never   Smokeless tobacco: Never  Vaping Use   Vaping Use: Never used  Substance and Sexual Activity   Alcohol use: No    Alcohol/week: 0.0 standard drinks of alcohol   Drug use: No    Comment: rx opiods   Sexual activity: Yes    Birth control/protection: Condom  Other Topics Concern   Not on file  Social History Narrative   Not on file   Social Determinants of Health   Financial Resource Strain: Low Risk  (05/09/2018)   Overall Financial Resource Strain (CARDIA)    Difficulty of Paying Living Expenses: Not very hard  Food Insecurity: No Food Insecurity (05/09/2018)   Hunger Vital Sign    Worried About Running Out of Food in the Last Year: Never true    Ran Out of Food in the Last Year: Never true  Transportation Needs: No Transportation Needs (05/09/2018)   PRAPARE - Hydrologist (Medical): No    Lack of Transportation (Non-Medical): No  Physical Activity: Inactive (05/09/2018)   Exercise Vital Sign    Days of Exercise per Week: 0 days    Minutes of Exercise per Session: 0 min  Stress: No Stress Concern Present (05/09/2018)   [place]    Feeling of Stress : Only a little  Social Connections: Unknown (05/09/2018)   Social Connection and Isolation Panel [NHANES]    Frequency  of Communication with Friends and Family: Once a week    Frequency of Social Gatherings with Friends and Family: Once a week    Attends Religious Services: Patient declined    Marine scientist or Organizations: No    Attends Music therapist: Never    Marital Status: Separated   Past Surgical History:  Procedure Laterality Date   ABDOMINOPLASTY     CHOLECYSTECTOMY     Past Surgical History:  Procedure Laterality Date   ABDOMINOPLASTY     CHOLECYSTECTOMY     Past Medical History:  Diagnosis Date   DEPRESSION/ANXIETY 08/08/2009   Diabetes mellitus  without complication ([place])    IBS 02/02/2010   Neuropathy    Polyneuropathy 12/04/2010   Renal stones    Sleep apnea 05/2021   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) 08/08/2009   BP 124/75   Pulse (!) 113   Ht 6' (1.829 m)   Wt 267 lb (121.1 kg)   LMP 05/06/2018   SpO2 100%   BMI 36.21 kg/m   Opioid Risk Score:   Fall Risk Score:  `1  Depression screen PHQ 2/9     06/12/2022    3:12 PM 05/02/2022    3:31 PM 03/27/2022    3:30 PM 02/07/2022    2:40 PM 02/06/2022   11:10 AM 01/15/2022    3:24 PM 07/18/2021    3:23 PM  Depression screen PHQ 2/9  Decreased Interest 0 0 0 1 1 2  0  Down, Depressed, Hopeless 0 0 0 1 1 2  0  PHQ - 2 Score 0 0 0 2 2 4  0  Altered sleeping     3 3   Tired, decreased energy     3 3   Change in appetite     0 0   Feeling bad or failure about yourself      2 3   Trouble concentrating     0 1   Moving slowly or fidgety/restless     0 0   Suicidal thoughts     0 0   PHQ-9 Score     10 14      Review of Systems  Constitutional: Negative.   HENT: Negative.    Eyes: Negative.   Respiratory: Negative.    Cardiovascular: Negative.   Gastrointestinal: Negative.   Endocrine: Negative.   Genitourinary: Negative.   Musculoskeletal:        Hands and feet   Skin: Negative.   Allergic/Immunologic: Negative.   Neurological:        Tingling  Hematological: Negative.   Psychiatric/Behavioral: Negative.    All other systems reviewed and are negative.      Objective:   Physical Exam Vitals and nursing note reviewed.  Constitutional:      Appearance: She is obese.  Cardiovascular:     Rate and Rhythm: Regular rhythm. Tachycardia present.     Pulses: Normal pulses.     Heart sounds: Normal heart sounds.  Musculoskeletal:     Cervical back: Normal range of motion and neck supple.     Comments: Normal Muscle Bulk and Muscle Testing Reveals:  Upper Extremities:Full ROM and  Muscle Strength  5/5 Lower Extremities: Full ROM and Muscle Strength 5/5 Arises from Table slowly using cane for support Narrow Based Gait     Skin:    General: Skin is warm and dry.  Neurological:     Mental Status:  She is oriented to person, place, and time.  Psychiatric:        Mood and Affect: Mood normal.        Behavior: Behavior normal.         Assessment & Plan:  1.Severe peripheral polyneuropathy of undetermined etiology.: 06/12/2022 Refilled: Vanessa Contin 30 mg one tablet every 12 hours #60 and  MSIR 30 mg one tablet at HS, may take a tablet during the day as needed #60.We will continue the opioid monitoring program, this consists of regular clinic visits, examinations, urine drug screen, pill counts as well as use of [place] Controlled Substance Reporting system. A 12 month History has been reviewed on the [place] Controlled Substance Reporting System on 03/27/2022 2. Bilateral progressive upper extremity hand pain/ Polyneuropathy. Continue with current medication regime. Tight Glucose Control. Adhere to healthy diet regime. Continue with current medication regimen with nortriptyline: 06/12/2022 3. Muscle Spasms: Continue current medication regimen with  Zanaflex. Continue to Monitor.  06/12/2022 4. Depression/Anxiety: Continue current medication regimen with Paxil and  Klonopin. Continue to Monitor. 06/12/2022  5. Foot Ulcer: Vanessa. Orrison reports Podiatry following her Foot Ulcer. Continue to monitor, Dr Blenda Mounts note was reviewed.   F/U in 1 month

## 2022-06-13 ENCOUNTER — Other Ambulatory Visit (HOSPITAL_COMMUNITY): Payer: Self-pay

## 2022-06-14 ENCOUNTER — Ambulatory Visit: Payer: Medicaid Other | Admitting: Obstetrics and Gynecology

## 2022-06-15 ENCOUNTER — Other Ambulatory Visit: Payer: Self-pay | Admitting: Student

## 2022-06-15 DIAGNOSIS — E119 Type 2 diabetes mellitus without complications: Secondary | ICD-10-CM

## 2022-06-21 ENCOUNTER — Ambulatory Visit (HOSPITAL_COMMUNITY): Payer: Medicaid Other

## 2022-06-21 ENCOUNTER — Ambulatory Visit (INDEPENDENT_AMBULATORY_CARE_PROVIDER_SITE_OTHER): Payer: Medicaid Other | Admitting: Pharmacist

## 2022-06-21 ENCOUNTER — Encounter: Payer: Self-pay | Admitting: Pharmacist

## 2022-06-21 VITALS — BP 124/67 | HR 83 | Wt 265.6 lb

## 2022-06-21 DIAGNOSIS — I152 Hypertension secondary to endocrine disorders: Secondary | ICD-10-CM

## 2022-06-21 DIAGNOSIS — E1159 Type 2 diabetes mellitus with other circulatory complications: Secondary | ICD-10-CM

## 2022-06-21 DIAGNOSIS — E118 Type 2 diabetes mellitus with unspecified complications: Secondary | ICD-10-CM | POA: Diagnosis not present

## 2022-06-21 MED ORDER — FREESTYLE LIBRE 3 SENSOR MISC: 11 refills | Status: DC

## 2022-06-21 MED ORDER — ENALAPRIL MALEATE 5 MG PO TABS: 5.0000 mg | ORAL_TABLET | Freq: Every day | ORAL | 3 refills | Status: DC

## 2022-06-21 NOTE — Progress Notes (Signed)
S:    Chief Complaint  Patient presents with   Medication Management    diabetes   52 y.o. female who presents for diabetes evaluation, education, and management.  PMH is significant for T2DM, chronic pain, hypertriglyceridemia, HTN.  Patient was referred and last seen by Primary Care Provider, Dr. Ronnald Ramp, on 04/16/2022. Last seen by pharmacy telephone visit was 05/22/2022 where Ozempic (semaglutide) was increased from 0.5 to 1 mg weekly.   Today, patient arrives in good spirits and ambulates with a cane. Reports neuropathy is slightly worse since sugars have been controlled. Also reports she feels more tired. However, she is still very pleased with the progess she has made since starting [employer].   Current diabetes medications include: Lantus (insulin glargine) 40 units daily, Ozempic (semaglutide) 1 mg weekly, metformin 1000 mg BID Current hypertension medications include: enalapril 10 mg daily Current hyperlipidemia medications include: atorvastatin 80 mg daily, fenofibrate 145 mg daily  Patient reports adherence to taking all medications as prescribed.   Insurance coverage: Medicaid   Patient denies hypoglycemic events.  Patient reports nocturia (nighttime urination).  Patient reports neuropathy (nerve pain) - slightly worse since blood sugar has improved.  Patient denies vision changes.   Patient reported dietary habits: Eats 2 meals/day Breakfast: slimfast shake Lunch: mock meat, veggies, pasta Dinner: cereal  Snacks: raw veggies Drinks: diet sodas, diet green tea, flavored water (zero sugar)  Patient-reported exercise habits: Patient has a stationary bike and tries to use it for 20 minutes a day. However she has chronic pain and lives a relatively sedentary lifestyle.   O:  Review of Systems  Neurological:  Positive for dizziness, tingling and sensory change.       Neuropathy slightly worse with less numbness and more sharp pains  All other systems reviewed  and are negative.   Physical Exam Vitals reviewed.  Constitutional:      Appearance: Normal appearance.  Pulmonary:     Effort: Pulmonary effort is normal.  Neurological:     Mental Status: She is alert.  Psychiatric:        Mood and Affect: Mood normal.        Behavior: Behavior normal.        Thought Content: Thought content normal.    CGM Download:  % Time CGM is active: 92% Average Glucose: 174 mg/dL Glucose Management Indicator: 7.5%  Glucose Variability: 18.3% (goal <36%) Time in Goal:  - Time in range 70-180: 63% - Time above range: 37% - high: 36%; very high: 1% - Time below range: 0%    Lab Results  Component Value Date   HGBA1C 9.9 (A) 04/26/2022   Vitals:   06/21/22 1431 06/21/22 1432  BP: (!) 126/41 124/67  Pulse: 83   SpO2: 99%     Lipid Panel     Component Value Date/Time   CHOL 130 01/15/2022 1709   TRIG 403 (H) 01/15/2022 1709   HDL 38 (L) 01/15/2022 1709   CHOLHDL 3.4 01/15/2022 1709   CHOLHDL 4.6 07/18/2015 1630   VLDL NOT CALC 07/18/2015 1630   LDLCALC 34 01/15/2022 1709   LDLDIRECT 35 06/14/2011 1555    Clinical Atherosclerotic Cardiovascular Disease (ASCVD): No  The 10-year ASCVD risk score (Arnett DK, et al., 2019) is: 2.9%   Values used to calculate the score:     Age: 45 years     Sex: Female     Is Non-Hispanic African American: No     Diabetic: Yes  Tobacco smoker: No     Systolic Blood Pressure: [id number] mmHg     Is BP treated: No     HDL Cholesterol: 38 mg/dL     Total Cholesterol: 130 mg/dL   Patient is participating in a Managed Medicaid Plan:  Yes   A/P: Diabetes longstanding currently uncontrolled based on A1c, but continues to improve per CGM report. Patient is able to verbalize appropriate hypoglycemia management plan. Medication adherence appears appropriate.  -Continued basal insulin Lantus (insulin glargine) 40 units daily in the morning.  -Continued GLP-1 Ozempic (semaglutide) 1 mg once weekly. -Continued  metformin 1000 mg BID.  -Provided 2 additional CGM sensors (adequate for supply until 3 month Liberate Visit) -Patient educated on purpose, proper use, and potential adverse effects of Ozempic, insulin, and metformin.  -Extensively discussed pathophysiology of diabetes, recommended lifestyle interventions, dietary effects on blood sugar control.  -Counseled on s/sx of and management of hypoglycemia.  -Next A1c anticipated 2 months.   Hypertension associated with T2DM, DBP currently low and patient does complain of dizziness. BP in clinic 126/41 mmHg, recheck 124/67 mmHg. Blood pressure goal of <130/80 mmHg. Medication adherence appropriate.  -Decreased dose of enalapril from 10 mg to 5 mg daily.  Patient request dermatology referral for lipoma removal. Will route request to PCP.    Follow-up: 3 month LIBERATE study Visit Pharmacist 07/26/2022 Patient seen with Louanne Belton PharmD PGY-1 Pharmacy Resident and Joseph Art, PharmD, PGY2 Pharmacy Resident.

## 2022-06-21 NOTE — Patient Instructions (Addendum)
It was nice to see you today!  Your goal blood sugar is 80-130 before eating and less than 180 after eating.  Medication Changes: Decrease enalapril to 5 mg daily.   Monitor blood sugars at home and keep a log (glucometer or piece of paper) to bring with you to your next visit.  Keep up the good work with diet and exercise. Aim for a diet full of vegetables, fruit and lean meats (chicken, [place], fish). Try to limit salt intake by eating fresh or frozen vegetables (instead of canned), rinse canned vegetables prior to cooking and do not add any additional salt to meals.

## 2022-06-21 NOTE — Assessment & Plan Note (Signed)
Diabetes longstanding currently uncontrolled based on A1c, but continues to improve per CGM report. Patient is able to verbalize appropriate hypoglycemia management plan. Medication adherence appears appropriate.  -Continued basal insulin Lantus (insulin glargine) 40 units daily in the morning.  -Continued GLP-1 Ozempic (semaglutide) 1 mg once weekly. -Continued metformin 1000 mg BID.  -Provided 2 additional CGM sensors (adequate for supply until 3 month Liberate Visit) -Patient educated on purpose, proper use, and potential adverse effects of Ozempic, insulin, and metformin.  -Extensively discussed pathophysiology of diabetes, recommended lifestyle interventions, dietary effects on blood sugar control.  -Counseled on s/sx of and management of hypoglycemia.  -Next A1c anticipated 2 months.

## 2022-06-21 NOTE — Assessment & Plan Note (Signed)
Hypertension associated with T2DM, DBP currently low and patient does complain of dizziness. BP in clinic 126/41 mmHg, recheck 124/67 mmHg. Blood pressure goal of <130/80 mmHg. Medication adherence appropriate.  -Decreased dose of enalapril from 10 mg to 5 mg daily.

## 2022-06-26 NOTE — Progress Notes (Signed)
Reviewed and agree with Dr Koval's plan.   

## 2022-06-28 ENCOUNTER — Telehealth: Payer: Self-pay

## 2022-06-28 NOTE — Telephone Encounter (Signed)
A Prior Authorization was initiated for this patients FREESTYLE LIBRE 3 SENSOR through CoverMyMeds.   Key: [id number]

## 2022-06-29 NOTE — Telephone Encounter (Signed)
Prior Auth for patients medication FREESTYLE LIBRE 3 SENSORS approved by [facility] [employer] from 06/28/22 to 12/28/22.  CoverMyMeds Key: [id number] PA Case ID #: [id number]

## 2022-07-04 ENCOUNTER — Encounter: Payer: Self-pay | Admitting: *Deleted

## 2022-07-04 ENCOUNTER — Telehealth: Payer: Self-pay | Admitting: *Deleted

## 2022-07-04 NOTE — Telephone Encounter (Signed)
Request Reference Number: [id number]. MORPHINE SUL TAB 30MG  is approved through 01/03/2023.

## 2022-07-04 NOTE — Telephone Encounter (Signed)
Prior auth submitted for MSIR 30 mg #60 to [facility]  via [employer].

## 2022-07-09 ENCOUNTER — Ambulatory Visit (HOSPITAL_COMMUNITY): Admission: RE | Admit: 2022-07-09 | Payer: Medicaid Other | Source: Ambulatory Visit

## 2022-07-18 ENCOUNTER — Other Ambulatory Visit (HOSPITAL_COMMUNITY): Payer: Self-pay

## 2022-07-18 ENCOUNTER — Encounter: Payer: Medicaid Other | Admitting: Registered Nurse

## 2022-07-22 ENCOUNTER — Other Ambulatory Visit: Payer: Self-pay | Admitting: Pharmacist

## 2022-07-22 DIAGNOSIS — E118 Type 2 diabetes mellitus with unspecified complications: Secondary | ICD-10-CM

## 2022-07-23 ENCOUNTER — Ambulatory Visit (INDEPENDENT_AMBULATORY_CARE_PROVIDER_SITE_OTHER): Payer: Medicaid Other | Admitting: Podiatry

## 2022-07-23 DIAGNOSIS — Z91199 Patient's noncompliance with other medical treatment and regimen due to unspecified reason: Secondary | ICD-10-CM

## 2022-07-23 NOTE — Progress Notes (Signed)
No show

## 2022-07-24 ENCOUNTER — Ambulatory Visit (HOSPITAL_COMMUNITY): Payer: Medicaid Other

## 2022-07-25 ENCOUNTER — Encounter: Payer: Medicaid Other | Admitting: Registered Nurse

## 2022-07-26 ENCOUNTER — Encounter: Payer: Medicaid Other | Admitting: Pharmacist

## 2022-07-26 ENCOUNTER — Encounter: Payer: Medicaid Other | Attending: Physical Medicine & Rehabilitation | Admitting: Registered Nurse

## 2022-07-26 VITALS — BP 148/81 | HR 99 | Ht 72.0 in | Wt 258.0 lb

## 2022-07-26 DIAGNOSIS — Z5181 Encounter for therapeutic drug level monitoring: Secondary | ICD-10-CM | POA: Diagnosis not present

## 2022-07-26 DIAGNOSIS — M62838 Other muscle spasm: Secondary | ICD-10-CM | POA: Insufficient documentation

## 2022-07-26 DIAGNOSIS — F341 Dysthymic disorder: Secondary | ICD-10-CM | POA: Diagnosis not present

## 2022-07-26 DIAGNOSIS — R000 Tachycardia, unspecified: Secondary | ICD-10-CM | POA: Diagnosis present

## 2022-07-26 DIAGNOSIS — Z79891 Long term (current) use of opiate analgesic: Secondary | ICD-10-CM | POA: Insufficient documentation

## 2022-07-26 DIAGNOSIS — G629 Polyneuropathy, unspecified: Secondary | ICD-10-CM | POA: Insufficient documentation

## 2022-07-26 DIAGNOSIS — G894 Chronic pain syndrome: Secondary | ICD-10-CM | POA: Diagnosis not present

## 2022-07-26 DIAGNOSIS — E1140 Type 2 diabetes mellitus with diabetic neuropathy, unspecified: Secondary | ICD-10-CM | POA: Diagnosis not present

## 2022-07-26 MED ORDER — MORPHINE SULFATE ER 30 MG PO TBCR: 30.0000 mg | EXTENDED_RELEASE_TABLET | Freq: Two times a day (BID) | ORAL | 0 refills | Status: DC

## 2022-07-26 MED ORDER — MORPHINE SULFATE 30 MG PO TABS: ORAL_TABLET | ORAL | 0 refills | Status: DC

## 2022-07-26 NOTE — Progress Notes (Signed)
Subjective:    Patient ID: [name], female    DOB: [birth year], 52 y.o.   MRN: [id number]  HPI: [name] is a 52 y.o. female who returns for follow up appointment for chronic pain and medication refill. She states her pain is located in her bilateral hands and bilateral feet with tingling and burning. She rates her pain 7. Her current exercise regime is walking and performing stretching exercises.  [name] Morphine equivalent is 120.00 MME.   UDS ordered today.    Pain Inventory Average Pain 7 Pain Right Now 7 My pain is constant, dull, stabbing, tingling, and aching  In the last 24 hours, has pain interfered with the following? General activity 8 Relation with others 8 Enjoyment of life 8 What TIME of day is your pain at its worst? morning  Sleep (in general) Poor  Pain is worse with: walking, bending, standing, and some activites Pain improves with: rest Relief from Meds: 8  Family History  Problem Relation Age of Onset   Fibromyalgia Mother    Aortic aneurysm Mother    Stroke Maternal Grandmother    Colon cancer Maternal Grandmother    Heart Problems Paternal Grandmother    Dementia Paternal Grandmother    Heart attack Paternal Grandfather    Social History   Socioeconomic History   Marital status: Legally Separated    Spouse name: Not on file   Number of children: 1   Years of education: Not on file   Highest education level: Bachelor's degree (e.g., BA, AB, BS)  Occupational History   Occupation: Disabled  Tobacco Use   Smoking status: Never   Smokeless tobacco: Never  Vaping Use   Vaping Use: Never used  Substance and Sexual Activity   Alcohol use: No    Alcohol/week: 0.0 standard drinks of alcohol   Drug use: No    Comment: rx opiods   Sexual activity: Yes    Birth control/protection: Condom  Other Topics Concern   Not on file  Social History Narrative   Not on file   Social Determinants of Health   Financial  Resource Strain: Low Risk  (05/09/2018)   Overall Financial Resource Strain (CARDIA)    Difficulty of Paying Living Expenses: Not very hard  Food Insecurity: No Food Insecurity (05/09/2018)   Hunger Vital Sign    Worried About Running Out of Food in the Last Year: Never true    Ran Out of Food in the Last Year: Never true  Transportation Needs: No Transportation Needs (05/09/2018)   PRAPARE - Administrator, Civil Service (Medical): No    Lack of Transportation (Non-Medical): No  Physical Activity: Inactive (05/09/2018)   Exercise Vital Sign    Days of Exercise per Week: 0 days    Minutes of Exercise per Session: 0 min  Stress: No Stress Concern Present (05/09/2018)   [employer] of Occupational Health - Occupational Stress Questionnaire    Feeling of Stress : Only a little  Social Connections: Unknown (05/09/2018)   Social Connection and Isolation Panel [NHANES]    Frequency of Communication with Friends and Family: Once a week    Frequency of Social Gatherings with Friends and Family: Once a week    Attends Religious Services: Patient declined    Database administrator or Organizations: No    Attends Banker Meetings: Never    Marital Status: Separated   Past Surgical History:  Procedure Laterality Date  ABDOMINOPLASTY     CHOLECYSTECTOMY     Past Surgical History:  Procedure Laterality Date   ABDOMINOPLASTY     CHOLECYSTECTOMY     Past Medical History:  Diagnosis Date   DEPRESSION/ANXIETY 08/08/2009   Diabetes mellitus without complication (HCC)    IBS 02/02/2010   Neuropathy    Polyneuropathy 12/04/2010   Renal stones    Sleep apnea 05/2021   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) 08/08/2009   LMP 05/06/2018   Opioid Risk Score:   Fall Risk Score:  `1  Depression screen PHQ 2/9     06/12/2022    3:12 PM 05/02/2022    3:31 PM 03/27/2022    3:30 PM 02/07/2022     2:40 PM 02/06/2022   11:10 AM 01/15/2022    3:24 PM 07/18/2021    3:23 PM  Depression screen PHQ 2/9  Decreased Interest 0 0 0 1 1 2  0  Down, Depressed, Hopeless 0 0 0 1 1 2  0  PHQ - 2 Score 0 0 0 2 2 4  0  Altered sleeping     3 3   Tired, decreased energy     3 3   Change in appetite     0 0   Feeling bad or failure about yourself      2 3   Trouble concentrating     0 1   Moving slowly or fidgety/restless     0 0   Suicidal thoughts     0 0   PHQ-9 Score     10 14       Review of Systems  Musculoskeletal:        B/L hand feet pain       Objective:   Physical Exam Vitals and nursing note reviewed.  Constitutional:      Appearance: Normal appearance.  Cardiovascular:     Rate and Rhythm: Normal rate and regular rhythm.     Pulses: Normal pulses.     Heart sounds: Normal heart sounds.  Pulmonary:     Effort: Pulmonary effort is normal.     Breath sounds: Normal breath sounds.  Musculoskeletal:     Cervical back: Normal range of motion and neck supple.     Comments: Normal Muscle Bulk and Muscle Testing Reveals:  Upper Extremities: Full ROM and Muscle Strength 5/5  Lower Extremities : Full ROM and Muscle Strength 5/5 Arises from Table slowly  Narrow Based Gait     Skin:    General: Skin is warm and dry.  Neurological:     Mental Status: She is alert and oriented to person, place, and time.  Psychiatric:        Mood and Affect: Mood normal.        Behavior: Behavior normal.         Assessment & Plan:  1.Severe peripheral polyneuropathy of undetermined etiology.: 07/26/2022 Refilled: MS Contin 30 mg one tablet every 12 hours #60 and  MSIR 30 mg one tablet at HS, may take a tablet during the day as needed #60.We will continue the opioid monitoring program, this consists of regular clinic visits, examinations, urine drug screen, pill counts as well as use of [place] Controlled Substance Reporting system. A 12 month History has been reviewed on the [place] Controlled Substance Reporting System on 03/27/2022 2. Bilateral progressive upper extremity hand pain/ Polyneuropathy. Continue with current medication regime. Tight Glucose Control. Adhere  to healthy diet regime. Continue with current medication regimen with nortriptyline: 07/26/2022 3. Muscle Spasms: Continue current medication regimen with  Zanaflex. Continue to Monitor.  07/26/2022 4. Depression/Anxiety: Continue current medication regimen with Paxil and  Klonopin. Continue to Monitor. 07/26/2022  5. Foot Ulcer: Ms. Gotch reports Podiatry following her Foot Ulcer. Continue to monitor, Dr Ralene Cork note was reviewed.    F/U in 1 month

## 2022-07-27 ENCOUNTER — Telehealth: Payer: Self-pay | Admitting: *Deleted

## 2022-07-27 NOTE — Telephone Encounter (Signed)
Prior auth submitted to [employer] via CoverMyMeds for MS Contin.

## 2022-07-27 NOTE — Telephone Encounter (Signed)
Request Reference Number: [id number]. MORPHINE SUL TAB 30MG  ER is approved through 10/27/2022. For further questions, call [employer] at [phone]. Ms Noury notified.

## 2022-07-30 ENCOUNTER — Ambulatory Visit (HOSPITAL_COMMUNITY)
Admission: RE | Admit: 2022-07-30 | Payer: Medicaid Other | Source: Ambulatory Visit | Attending: Podiatry | Admitting: Podiatry

## 2022-07-31 ENCOUNTER — Encounter: Payer: Self-pay | Admitting: Pharmacist

## 2022-07-31 ENCOUNTER — Encounter: Payer: Self-pay | Admitting: Registered Nurse

## 2022-07-31 ENCOUNTER — Ambulatory Visit (INDEPENDENT_AMBULATORY_CARE_PROVIDER_SITE_OTHER): Payer: Medicaid Other | Admitting: Pharmacist

## 2022-07-31 VITALS — BP 143/80 | Resp 98 | Ht 71.0 in | Wt 260.0 lb

## 2022-07-31 DIAGNOSIS — E1140 Type 2 diabetes mellitus with diabetic neuropathy, unspecified: Secondary | ICD-10-CM

## 2022-07-31 DIAGNOSIS — E118 Type 2 diabetes mellitus with unspecified complications: Secondary | ICD-10-CM

## 2022-07-31 DIAGNOSIS — I152 Hypertension secondary to endocrine disorders: Secondary | ICD-10-CM

## 2022-07-31 DIAGNOSIS — E1159 Type 2 diabetes mellitus with other circulatory complications: Secondary | ICD-10-CM

## 2022-07-31 LAB — POCT GLYCOSYLATED HEMOGLOBIN (HGB A1C): HbA1c, POC (controlled diabetic range): 7.5 % — AB (ref 0.0–7.0)

## 2022-07-31 LAB — TOXASSURE SELECT,+ANTIDEPR,UR

## 2022-07-31 MED ORDER — ENALAPRIL MALEATE 10 MG PO TABS: 10.0000 mg | ORAL_TABLET | Freq: Every day | ORAL | 3 refills | Status: DC

## 2022-07-31 MED ORDER — INSULIN GLARGINE SOLOSTAR 100 UNIT/ML ~~LOC~~ SOPN: 34.0000 [IU] | PEN_INJECTOR | Freq: Every day | SUBCUTANEOUS | 3 refills | Status: DC

## 2022-07-31 MED ORDER — PAROXETINE HCL 30 MG PO TABS: 30.0000 mg | ORAL_TABLET | Freq: Every day | ORAL | 1 refills | Status: DC

## 2022-07-31 MED ORDER — OZEMPIC (2 MG/DOSE) 8 MG/3ML ~~LOC~~ SOPN: 2.0000 mg | PEN_INJECTOR | SUBCUTANEOUS | 11 refills | Status: DC

## 2022-07-31 MED ORDER — DULOXETINE HCL 30 MG PO CPEP: 30.0000 mg | ORAL_CAPSULE | Freq: Every day | ORAL | 3 refills | Status: DC

## 2022-07-31 NOTE — Assessment & Plan Note (Signed)
Neuropathy and history of OCD Patient complained of urinary retention with nortriptyline, despite benefit for neuropathy. -Decreased dose of paroxetine from 40 mg to 30 mg with plan to titrate down.  After 3 days of reduced dose then begin trial duloxetine 30 mg daily for neuropathy/depression with cross titration from paroxetine.  - Plan is slow titration off from paroxetine to duloxetine with cross-taper.

## 2022-07-31 NOTE — Patient Instructions (Addendum)
It was nice to see you today!  Your goal blood sugar is 80-130 before eating and less than 180 after eating.  Medication Changes: Continue Ozempic 1 mg weekly until out.  When you start taking 2 mg of Ozempic (semaglutide) weekly, Reduce Lantus to 34 units daily.  Increase enalapril from 5 mg to 10 mg daily. Monitor blood pressure at home.  Decrease paroxetine (Paxil) to 30 mg daily. Start duloxetine (Cymbalta) 30 mg daily after 5 days of taking the paroxetine 30 mg.  Discontinue metformin 1000 mg.   Monitor blood sugars at home and keep a log (glucometer or piece of paper) to bring with you to your next visit.  Keep up the good work with diet and exercise. Aim for a diet full of vegetables, fruit and lean meats (chicken, [place], fish). Try to limit salt intake by eating fresh or frozen vegetables (instead of canned), rinse canned vegetables prior to cooking and do not add any additional salt to meals.

## 2022-07-31 NOTE — Progress Notes (Signed)
In room to evaluate wound on patients foot. She notes it has been there for past week or too, not painful or drainage but she has severe numbness from her diabetes. Picture in media tab. Some coldness of feet noted, which she notes is normal for her. She notes she was to have an ABI yesterday but called to reschedule it.  Small ulcer on right lateral fourth toe without necrosis, erythema, or drainage. Normal cap refill, no black areas or necrosis, 1+ dorsalis pedis pulse palpated.     A/P: Foot ulcer - appears at area of high friction, given gauze pads to pad toes, no signs/symptoms of infection, discussed return precautions including redness, drainage, turning black discoloration of toes  - Recommended close f/u in 1-2 weeks with PCP/podiatry to monitor this  Concern for PAD - pt with many risk factors, notes she has had ABI ordered but cancelled yesterday, discussed rescheduling and if toes turning blue/black or severe pain at foot to go to ED, currently with normal cap refill and pulses and without signs of acute limb ischemia  Burley Saver MD

## 2022-07-31 NOTE — Assessment & Plan Note (Signed)
A/P: Liberate Study - 3 month visit.  Diabetes longstanding currently with improved control based on A1c reduction from 9.9 to  7.5% today. Patient is able to verbalize appropriate hypoglycemia management plan. Medication adherence appears good. -Decreased dose of basal insulin Lantus (insulin glargine) from 40 units to 34 units daily - when titration of Ozempic is available.  -Increased dose of GLP-1 Ozempic (semaglutide) from 1 mg to 2 mg weekly. -Trial off metformin with improved A1c per patient request to discontinue. -Patient educated on purpose, proper use, and potential adverse effects of medications. -Extensively discussed pathophysiology of diabetes, recommended lifestyle interventions, dietary effects on blood sugar control.  -Counseled on s/sx of and management of hypoglycemia.

## 2022-07-31 NOTE — Progress Notes (Signed)
S:     Chief Complaint  Patient presents with   Medication Management    Cgm follow up    52 y.o. female who presents for diabetes evaluation, education, and management.  PMH is significant for T2DM, chronic pain, hypertriglyceridemia, HTN.  Patient was referred and last seen by Primary Care Provider, Dr. Yetta Barre, on 04/16/2022. Last seen by pharmacy on 06/21/22 where no changes were made and more CGM sensors were provided.  Today, patient arrives in  good spirits and presents without  any assistance.   Current diabetes medications include: Lantus (insulin glargine) 40 units daily, Ozempic (semaglutide) 1 mg weekly, metformin 1000 mg BID Current hypertension medications include: enalapril 10 mg daily Current hyperlipidemia medications include: atorvastatin 80 mg daily, fenofibrate 145 mg daily  Patient reports adherence to taking all medications as prescribed.   Do you feel that your medications are working for you? yes Have you been experiencing any side effects to the medications prescribed? no Do you have any problems obtaining medications due to transportation or finances? no Insurance coverage: Medicaid  Patient denies hypoglycemic events.  Patient denies nocturia (nighttime urination).  Patient reports neuropathy (nerve pain).   Patient reported dietary habits: vegetarian  Patient-reported exercise habits: Patient endorses relatively sedentary lifestyle secondary to chronic pain.  Patient inquired about possibility of adjusting nortriptyline, paroxetine due to urinary retention and desire to be tapered from paroxetine to another therapy for OCD.   O:   Review of Systems  Neurological:  Positive for sensory change.  Psychiatric/Behavioral:  The patient has insomnia.     Physical Exam Constitutional:      Appearance: Normal appearance.  Pulmonary:     Effort: Pulmonary effort is normal.  Skin:    Findings: Lesion (right foot) present.  Neurological:      Mental Status: She is alert.  Psychiatric:        Mood and Affect: Mood normal.        Behavior: Behavior normal.        Thought Content: Thought content normal.        Judgment: Judgment normal.   Deferred to Dr. Miquel Dunn for foot for evaluation of multiple lesions of redness on the right foot   07/18/22-07/31/22 CGM Download:  % Time CGM is active: 94% Average Glucose: 147 mg/dL Glucose Management Indicator: 6.8  Glucose Variability: 18.5 (goal <36%) Time in Goal:  - Time in range 70-180: 89% - Time above range: 11% - Time below range: 0% Observed patterns:   Lab Results  Component Value Date   HGBA1C 7.5 (A) 07/31/2022   Vitals:   07/31/22 1555 07/31/22 1613  BP: (!) 140/76 (!) 143/80  Resp: (!) 98     Lipid Panel     Component Value Date/Time   CHOL 130 01/15/2022 1709   TRIG 403 (H) 01/15/2022 1709   HDL 38 (L) 01/15/2022 1709   CHOLHDL 3.4 01/15/2022 1709   CHOLHDL 4.6 07/18/2015 1630   VLDL NOT CALC 07/18/2015 1630   LDLCALC 34 01/15/2022 1709   LDLDIRECT 35 06/14/2011 1555    Clinical Atherosclerotic Cardiovascular Disease (ASCVD): No  The 10-year ASCVD risk score (Arnett DK, et al., 2019) is: 5.2%   Values used to calculate the score:     Age: 26 years     Sex: Female     Is Non-Hispanic African American: No     Diabetic: Yes     Tobacco smoker: No     Systolic Blood Pressure: 143  mmHg     Is BP treated: Yes     HDL Cholesterol: 38 mg/dL     Total Cholesterol: 130 mg/dL   Patient is participating in a Managed Medicaid Plan:  Yes   A/P: Liberate Study - 3 month visit.  Diabetes longstanding currently with improved control based on A1c reduction from 9.9 to  7.5% today. Patient is able to verbalize appropriate hypoglycemia management plan. Medication adherence appears good. -Decreased dose of basal insulin Lantus (insulin glargine) from 40 units to 34 units daily - when titration of Ozempic is available.  -Increased dose of GLP-1 Ozempic (semaglutide)  from 1 mg to 2 mg weekly. -Trial off metformin with improved A1c per patient request to discontinue. -Patient educated on purpose, proper use, and potential adverse effects of medications. -Extensively discussed pathophysiology of diabetes, recommended lifestyle interventions, dietary effects on blood sugar control.  -Counseled on s/sx of and management of hypoglycemia.   Neuropathy and history of OCD Patient complained of urinary retention with nortriptyline, despite benefit for neuropathy. -Decreased dose of paroxetine from 40 mg to 30 mg with plan to titrate down.  After 3 days of reduced dose then begin trial duloxetine 30 mg daily for neuropathy/depression with cross titration from paroxetine.  - Plan is slow titration off from paroxetine to duloxetine with cross-taper.   Hypertension longstanding currently uncontrolled. Blood pressure goal of <130/80 mmHg. Medication adherence good. Blood pressure control is suboptimal due to decreased enalapril at previous visit. -Increased dose of enalapril to 10 mg daily.  Right foot wound evaluated with assistance from Dr. Miquel Dunn.    Written patient instructions provided. Patient verbalized understanding of treatment plan.  Total time in face to face counseling 38 minutes.    Follow-up:  Pharmacist 6weeks . PCP clinic visit in 3-4 weeks .  Patient seen with Haze Boyden PharmD Candidate

## 2022-07-31 NOTE — Assessment & Plan Note (Signed)
Hypertension longstanding currently uncontrolled. Blood pressure goal of <130/80 mmHg. Medication adherence good. Blood pressure control is suboptimal due to decreased enalapril at previous visit. -Increased dose of enalapril to 10 mg daily.

## 2022-08-01 ENCOUNTER — Encounter: Payer: Self-pay | Admitting: Registered Nurse

## 2022-08-06 ENCOUNTER — Telehealth: Payer: Self-pay

## 2022-08-06 NOTE — Telephone Encounter (Signed)
PA came back stating "The Community and States Medicaid Prior Authorization Team is not able to review this request because the requested product has been previously approved under [id number]."

## 2022-08-06 NOTE — Progress Notes (Signed)
Reviewed and agree with Dr Koval's plan.   

## 2022-08-07 ENCOUNTER — Ambulatory Visit (HOSPITAL_COMMUNITY)
Admission: RE | Admit: 2022-08-07 | Payer: Medicaid Other | Source: Ambulatory Visit | Attending: Podiatry | Admitting: Podiatry

## 2022-08-08 ENCOUNTER — Ambulatory Visit: Payer: Medicaid Other | Admitting: Obstetrics and Gynecology

## 2022-08-10 ENCOUNTER — Telehealth: Payer: Self-pay | Admitting: Registered Nurse

## 2022-08-10 NOTE — Telephone Encounter (Signed)
Spoke with Dr Wynn Banker  regarding Ms. Scullion increase intensity and frequency of neuropathic pain. A referral can be placed to Dr Lorrine Kin, regading Spinal Stimulator.  Call placed to Ms. Placzek, no answer. Left message to return the call.  Will send a My-Chart message to have Ms. Cunnigham to call office.

## 2022-08-15 ENCOUNTER — Telehealth: Payer: Self-pay | Admitting: *Deleted

## 2022-08-15 DIAGNOSIS — G4733 Obstructive sleep apnea (adult) (pediatric): Secondary | ICD-10-CM | POA: Diagnosis not present

## 2022-08-15 NOTE — Telephone Encounter (Signed)
Urine drug screen for this encounter is consistent for prescribed medication 

## 2022-08-20 ENCOUNTER — Encounter: Payer: Self-pay | Admitting: Student

## 2022-08-21 ENCOUNTER — Other Ambulatory Visit: Payer: Self-pay | Admitting: Student

## 2022-08-21 ENCOUNTER — Encounter: Payer: Self-pay | Admitting: Registered Nurse

## 2022-08-21 ENCOUNTER — Encounter: Payer: Medicaid Other | Admitting: Registered Nurse

## 2022-08-21 VITALS — BP 123/78 | HR 102 | Ht 71.0 in | Wt 254.0 lb

## 2022-08-21 DIAGNOSIS — F341 Dysthymic disorder: Secondary | ICD-10-CM

## 2022-08-21 DIAGNOSIS — G894 Chronic pain syndrome: Secondary | ICD-10-CM | POA: Diagnosis not present

## 2022-08-21 DIAGNOSIS — R000 Tachycardia, unspecified: Secondary | ICD-10-CM | POA: Diagnosis not present

## 2022-08-21 DIAGNOSIS — Z79891 Long term (current) use of opiate analgesic: Secondary | ICD-10-CM

## 2022-08-21 DIAGNOSIS — Z1211 Encounter for screening for malignant neoplasm of colon: Secondary | ICD-10-CM

## 2022-08-21 DIAGNOSIS — Z5181 Encounter for therapeutic drug level monitoring: Secondary | ICD-10-CM | POA: Diagnosis not present

## 2022-08-21 DIAGNOSIS — Z794 Long term (current) use of insulin: Secondary | ICD-10-CM

## 2022-08-21 DIAGNOSIS — G629 Polyneuropathy, unspecified: Secondary | ICD-10-CM

## 2022-08-21 DIAGNOSIS — M62838 Other muscle spasm: Secondary | ICD-10-CM | POA: Diagnosis not present

## 2022-08-21 DIAGNOSIS — Z1231 Encounter for screening mammogram for malignant neoplasm of breast: Secondary | ICD-10-CM

## 2022-08-21 DIAGNOSIS — E1140 Type 2 diabetes mellitus with diabetic neuropathy, unspecified: Secondary | ICD-10-CM

## 2022-08-21 MED ORDER — MORPHINE SULFATE ER 30 MG PO TBCR: 30.0000 mg | EXTENDED_RELEASE_TABLET | Freq: Two times a day (BID) | ORAL | 0 refills | Status: DC

## 2022-08-21 MED ORDER — MORPHINE SULFATE 30 MG PO TABS: ORAL_TABLET | ORAL | 0 refills | Status: DC

## 2022-08-21 NOTE — Progress Notes (Signed)
Subjective:    Patient ID: [name], female    DOB: [birth year], 52 y.o.   MRN: [id number]  HPI: [name] is a 52 y.o. female who returns for follow up appointment for chronic pain and medication refill. She states her pain is located in her bilateral hands and bilateral feet with tingling and burning. With her increase intensity and frequency of neuropathic pain, she will be scheduldule with Dr Natale Lay for Qutenza patches, she is in agreement and verbalizes understanding. She rates her pain 7. Her current exercise regime is walking and performing stretching exercises.  Ms. [place] Morphine equivalent is 120.00 MME. She is also prescribed Clonazepam .We have discussed the black box warning of using opioids and benzodiazepines. I highlighted the dangers of using these drugs together and discussed the adverse events including respiratory suppression, overdose, cognitive impairment and importance of compliance with current regimen. We will continue to monitor and adjust as indicated.    Last UDS was Performed on 07/26/2022, it was consistent.      Pain Inventory Average Pain 8 Pain Right Now 7 My pain is constant, sharp, burning, dull, stabbing, tingling, and aching  In the last 24 hours, has pain interfered with the following? General activity 6 Relation with others 6 Enjoyment of life 6 What TIME of day is your pain at its worst? morning  and night Sleep (in general) Poor  Pain is worse with: walking, sitting, standing, and some activites Pain improves with: rest and medication Relief from Meds: 9  Family History  Problem Relation Age of Onset   Fibromyalgia Mother    Aortic aneurysm Mother    Stroke Maternal Grandmother    Colon cancer Maternal Grandmother    Heart Problems Paternal Grandmother    Dementia Paternal Grandmother    Heart attack Paternal Grandfather    Social History   Socioeconomic History   Marital status: Legally Separated     Spouse name: Not on file   Number of children: 1   Years of education: Not on file   Highest education level: Bachelor's degree (e.g., BA, AB, BS)  Occupational History   Occupation: Disabled  Tobacco Use   Smoking status: Never   Smokeless tobacco: Never  Vaping Use   Vaping Use: Never used  Substance and Sexual Activity   Alcohol use: No    Alcohol/week: 0.0 standard drinks of alcohol   Drug use: No    Comment: rx opiods   Sexual activity: Yes    Birth control/protection: Condom  Other Topics Concern   Not on file  Social History Narrative   Not on file   Social Determinants of Health   Financial Resource Strain: Low Risk  (05/09/2018)   Overall Financial Resource Strain (CARDIA)    Difficulty of Paying Living Expenses: Not very hard  Food Insecurity: No Food Insecurity (05/09/2018)   Hunger Vital Sign    Worried About Running Out of Food in the Last Year: Never true    Ran Out of Food in the Last Year: Never true  Transportation Needs: No Transportation Needs (05/09/2018)   PRAPARE - Administrator, Civil Service (Medical): No    Lack of Transportation (Non-Medical): No  Physical Activity: Inactive (05/09/2018)   Exercise Vital Sign    Days of Exercise per Week: 0 days    Minutes of Exercise per Session: 0 min  Stress: No Stress Concern Present (05/09/2018)   [employer] of Occupational Health - Occupational Stress Questionnaire  Feeling of Stress : Only a little  Social Connections: Unknown (05/09/2018)   Social Connection and Isolation Panel [NHANES]    Frequency of Communication with Friends and Family: Once a week    Frequency of Social Gatherings with Friends and Family: Once a week    Attends Religious Services: Patient declined    Database administrator or Organizations: No    Attends Engineer, structural: Never    Marital Status: Separated   Past Surgical History:  Procedure Laterality Date   ABDOMINOPLASTY     CHOLECYSTECTOMY      Past Surgical History:  Procedure Laterality Date   ABDOMINOPLASTY     CHOLECYSTECTOMY     Past Medical History:  Diagnosis Date   DEPRESSION/ANXIETY 08/08/2009   Diabetes mellitus without complication (HCC)    IBS 02/02/2010   Neuropathy    Polyneuropathy 12/04/2010   Renal stones    Sleep apnea 05/2021   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) 08/08/2009   Ht 5\' 11"  (1.803 m)   Wt 254 lb (115.2 kg)   LMP 05/06/2018   BMI 35.43 kg/m   Opioid Risk Score:   Fall Risk Score:  `1  Depression screen PHQ 2/9     08/21/2022    3:20 PM 07/26/2022    2:29 PM 06/12/2022    3:12 PM 05/02/2022    3:31 PM 03/27/2022    3:30 PM 02/07/2022    2:40 PM 02/06/2022   11:10 AM  Depression screen PHQ 2/9  Decreased Interest 0 1 0 0 0 1 1  Down, Depressed, Hopeless 0 1 0 0 0 1 1  PHQ - 2 Score 0 2 0 0 0 2 2  Altered sleeping       3  Tired, decreased energy       3  Change in appetite       0  Feeling bad or failure about yourself        2  Trouble concentrating       0  Moving slowly or fidgety/restless       0  Suicidal thoughts       0  PHQ-9 Score       10    Review of Systems  Musculoskeletal:  Positive for gait problem.       Pain in both arms elbows down to both  hands Pain in both legs knees down to both feet  All other systems reviewed and are negative.     Objective:   Physical Exam Vitals and nursing note reviewed.  Constitutional:      Appearance: Normal appearance.  Cardiovascular:     Rate and Rhythm: Normal rate and regular rhythm.     Pulses: Normal pulses.     Heart sounds: Normal heart sounds.  Pulmonary:     Effort: Pulmonary effort is normal.     Breath sounds: Normal breath sounds.  Musculoskeletal:     Cervical back: Normal range of motion and neck supple.     Comments: Normal Muscle Bulk and Muscle Testing Reveals:  Upper Extremities: Full ROM and Muscle Strength 5/5 Lower  Extremities : Full ROM and Muscle Strength 5/5 Arises from Table slowly using cane for support Narrow Based Gait     Skin:    General: Skin is warm and dry.  Neurological:     Mental Status: She is alert and oriented to person, place, and time.  Psychiatric:        Mood and Affect: Mood normal.        Behavior: Behavior normal.         Assessment & Plan:  1.Severe peripheral polyneuropathy of undetermined etiology.: 08/21/2022 Refilled: MS Contin 30 mg one tablet every 12 hours #60 and  MSIR 30 mg one tablet at HS, may take a tablet during the day as needed #60.We will continue the opioid monitoring program, this consists of regular clinic visits, examinations, urine drug screen, pill counts as well as use of [place] Controlled Substance Reporting system. A 12 month History has been reviewed on the [place] Controlled Substance Reporting System on 08/21/2022 2. Bilateral progressive upper extremity hand pain/ Polyneuropathy. Continue with current medication regime. Tight Glucose Control. Adhere to healthy diet regime. Continue with current medication regimen with nortriptyline: 08/21/2022 3. Muscle Spasms: Continue current medication regimen with  Zanaflex. Continue to Monitor.  08/21/2022 4. Depression/Anxiety: Continue current medication regimen with Paxil and  Klonopin. Continue to Monitor. 08/21/2022  5. Foot Ulcer: Ms. Waskey reports Podiatry following her Foot Ulcer. Continue to monitor, 08/21/2022   F/U in 1 month

## 2022-08-21 NOTE — Progress Notes (Signed)
Pt requests orders for routine mammogram and colonoscopy. Ordered placed.

## 2022-08-22 ENCOUNTER — Ambulatory Visit (HOSPITAL_COMMUNITY)
Admission: RE | Admit: 2022-08-22 | Payer: Medicaid Other | Source: Ambulatory Visit | Attending: Podiatry | Admitting: Podiatry

## 2022-08-25 DIAGNOSIS — G4733 Obstructive sleep apnea (adult) (pediatric): Secondary | ICD-10-CM | POA: Diagnosis not present

## 2022-09-03 ENCOUNTER — Encounter: Payer: Self-pay | Admitting: Gastroenterology

## 2022-09-07 ENCOUNTER — Encounter: Payer: Self-pay | Admitting: Student

## 2022-09-07 ENCOUNTER — Ambulatory Visit: Payer: Medicaid Other | Admitting: Student

## 2022-09-13 ENCOUNTER — Other Ambulatory Visit: Payer: Self-pay | Admitting: Podiatry

## 2022-09-13 ENCOUNTER — Encounter: Payer: Self-pay | Admitting: Podiatry

## 2022-09-13 DIAGNOSIS — L97521 Non-pressure chronic ulcer of other part of left foot limited to breakdown of skin: Secondary | ICD-10-CM

## 2022-09-13 DIAGNOSIS — L97422 Non-pressure chronic ulcer of left heel and midfoot with fat layer exposed: Secondary | ICD-10-CM

## 2022-09-15 DIAGNOSIS — G4733 Obstructive sleep apnea (adult) (pediatric): Secondary | ICD-10-CM | POA: Diagnosis not present

## 2022-09-20 ENCOUNTER — Encounter: Payer: Medicaid Other | Admitting: Registered Nurse

## 2022-09-20 ENCOUNTER — Ambulatory Visit: Payer: Medicaid Other | Admitting: Registered Nurse

## 2022-09-21 ENCOUNTER — Telehealth: Payer: Self-pay | Admitting: Podiatry

## 2022-09-21 ENCOUNTER — Other Ambulatory Visit: Payer: Self-pay | Admitting: Family Medicine

## 2022-09-21 DIAGNOSIS — E1140 Type 2 diabetes mellitus with diabetic neuropathy, unspecified: Secondary | ICD-10-CM

## 2022-09-21 NOTE — Telephone Encounter (Signed)
Called [employer] to check for pre-auth for [id number] and for [id number].  Para March had that we ordered a [id number], but I was given [phone] to call for, so I checked both)  Spoke with Caleb A. Today.  No pre-authorization needed.  Reference # [id number]  Messaged this information to [facility] via Teams to help facilitate scheduling.

## 2022-09-24 ENCOUNTER — Telehealth: Payer: Self-pay | Admitting: Registered Nurse

## 2022-09-24 ENCOUNTER — Encounter: Payer: Medicaid Other | Admitting: Registered Nurse

## 2022-09-24 DIAGNOSIS — F341 Dysthymic disorder: Secondary | ICD-10-CM

## 2022-09-24 DIAGNOSIS — G4733 Obstructive sleep apnea (adult) (pediatric): Secondary | ICD-10-CM | POA: Diagnosis not present

## 2022-09-24 MED ORDER — CLONAZEPAM 0.5 MG PO TABS: ORAL_TABLET | ORAL | 3 refills | Status: DC

## 2022-09-24 MED ORDER — MORPHINE SULFATE 30 MG PO TABS: ORAL_TABLET | ORAL | 0 refills | Status: DC

## 2022-09-24 MED ORDER — MORPHINE SULFATE ER 30 MG PO TBCR: 30.0000 mg | EXTENDED_RELEASE_TABLET | Freq: Two times a day (BID) | ORAL | 0 refills | Status: DC

## 2022-09-24 NOTE — Addendum Note (Signed)
Addended by: Jones Bales on: 09/24/2022 04:39 PM   Modules accepted: Orders

## 2022-09-24 NOTE — Telephone Encounter (Signed)
Patient called to cancel todays appointment , her mother had a stroke and she can not come in , patient states she runs out of Morphine extended release tomorrow only has 2 left and the second rx for morphine runs out in 4-5 days , patient uses [facility] on [address]

## 2022-09-24 NOTE — Telephone Encounter (Signed)
PMP was Reviewed.  Morphine IR, MS Contin e-scribed to pharmacy.  My-Chart message sent to Ms. Hailes.

## 2022-09-25 ENCOUNTER — Other Ambulatory Visit: Payer: Self-pay

## 2022-09-25 ENCOUNTER — Ambulatory Visit (AMBULATORY_SURGERY_CENTER): Payer: Medicaid Other

## 2022-09-25 VITALS — Ht 72.0 in | Wt 250.0 lb

## 2022-09-25 DIAGNOSIS — Z1211 Encounter for screening for malignant neoplasm of colon: Secondary | ICD-10-CM

## 2022-09-25 MED ORDER — NA SULFATE-K SULFATE-MG SULF 17.5-3.13-1.6 GM/177ML PO SOLN: 1.0000 | Freq: Once | ORAL | 0 refills | Status: AC

## 2022-09-25 NOTE — Progress Notes (Signed)
Denies allergies to eggs or soy products. Denies complication of anesthesia or sedation. Denies use of weight loss medication. Denies use of O2.   Emmi instructions given for colonoscopy.    During Pre-Visit [name] expressed anxiety about being sedated due to the medications that she takes. Like Morphine, and muscle relaxers. I told her that I did not think that was a problem however I would go ask one of our anesthetist. I spoke to [facility] and he did not anticipate that being an issue. I called the patient back and she states that she felt better about being sedated. I also told the patient that on the day of her procedure she can voice her concerns as well. Patient verbalizes understanding.

## 2022-09-26 ENCOUNTER — Telehealth: Payer: Self-pay

## 2022-09-26 NOTE — Telephone Encounter (Signed)
Chart review completed for patient. Patient is due for screening mammogram. Mychart message sent to patient to inquire about scheduling mammogram.  Tyana Butzer, Population Health Specialist.  

## 2022-10-01 ENCOUNTER — Ambulatory Visit: Payer: Medicaid Other

## 2022-10-09 ENCOUNTER — Telehealth: Payer: Self-pay | Admitting: Gastroenterology

## 2022-10-09 DIAGNOSIS — Z1211 Encounter for screening for malignant neoplasm of colon: Secondary | ICD-10-CM

## 2022-10-09 NOTE — Telephone Encounter (Signed)
Okay, sorry to hear this.  Thanks for letting me know.  I appreciate your help in rescheduling her.

## 2022-10-09 NOTE — Telephone Encounter (Signed)
Inbound call from patient stating that she is scheduled for a colonoscopy tomorrow with Dr. Adela Lank at 3:00. Patient stated that her mom had a stroke and she was just able to read her instructions and realized that she was not supposed to have raw veggies. Patient is requesting a call back to discuss if she can follow through with the procedure. Please advise.

## 2022-10-09 NOTE — Telephone Encounter (Signed)
Returned the patient's phone call there was no answer so I left a message and encouraged her to push the fluids today and she can proceed with her procedure tomorrow.

## 2022-10-09 NOTE — Telephone Encounter (Signed)
Spoke with pt. She is vegetarian and had been eating raw vegetables and salads as that is her main diet. Also she took Ozempic yesterday. RN informed pt that d/t the Ozempic she would need to be rescheduled. Pt verbalized understanding. Pt also stated to RN that she has constipation d/t medications she takes. Will have pt complete a 2 day prep. Reviewed instructions over the phone and sent via MyChart.

## 2022-10-10 ENCOUNTER — Encounter: Payer: Medicaid Other | Admitting: Gastroenterology

## 2022-10-10 NOTE — Telephone Encounter (Signed)
Addendum: forgot to enter ambulatory referral order. Order placed per protocol.

## 2022-10-10 NOTE — Addendum Note (Signed)
Addended by: Corbin Ade L on: 10/10/2022 07:51 AM   Modules accepted: Orders

## 2022-10-11 ENCOUNTER — Other Ambulatory Visit (HOSPITAL_COMMUNITY): Payer: Self-pay | Admitting: Podiatry

## 2022-10-11 ENCOUNTER — Encounter: Payer: Self-pay | Admitting: Physical Medicine & Rehabilitation

## 2022-10-11 ENCOUNTER — Encounter: Payer: Medicaid Other | Admitting: Physical Medicine & Rehabilitation

## 2022-10-11 DIAGNOSIS — I739 Peripheral vascular disease, unspecified: Secondary | ICD-10-CM

## 2022-10-15 DIAGNOSIS — G4733 Obstructive sleep apnea (adult) (pediatric): Secondary | ICD-10-CM | POA: Diagnosis not present

## 2022-10-17 ENCOUNTER — Other Ambulatory Visit: Payer: Self-pay | Admitting: Student

## 2022-10-19 ENCOUNTER — Encounter: Payer: Medicaid Other | Admitting: Registered Nurse

## 2022-10-23 ENCOUNTER — Encounter: Payer: Medicaid Other | Admitting: Registered Nurse

## 2022-10-23 IMAGING — MG DIGITAL DIAGNOSTIC BILAT W/ TOMO W/ CAD
8 of 16 series · 8 of 40 positions shown · non-contrast
Comparison: None.

CLINICAL DATA: Patient complains of diffuse upper left breast pain.
The patient's physician palpated an abnormality in the left axilla.

EXAM:
DIGITAL DIAGNOSTIC BILATERAL MAMMOGRAM WITH CAD AND TOMO
ULTRASOUND LEFT BREAST

[R MLO synth-2D (1 of 2)]
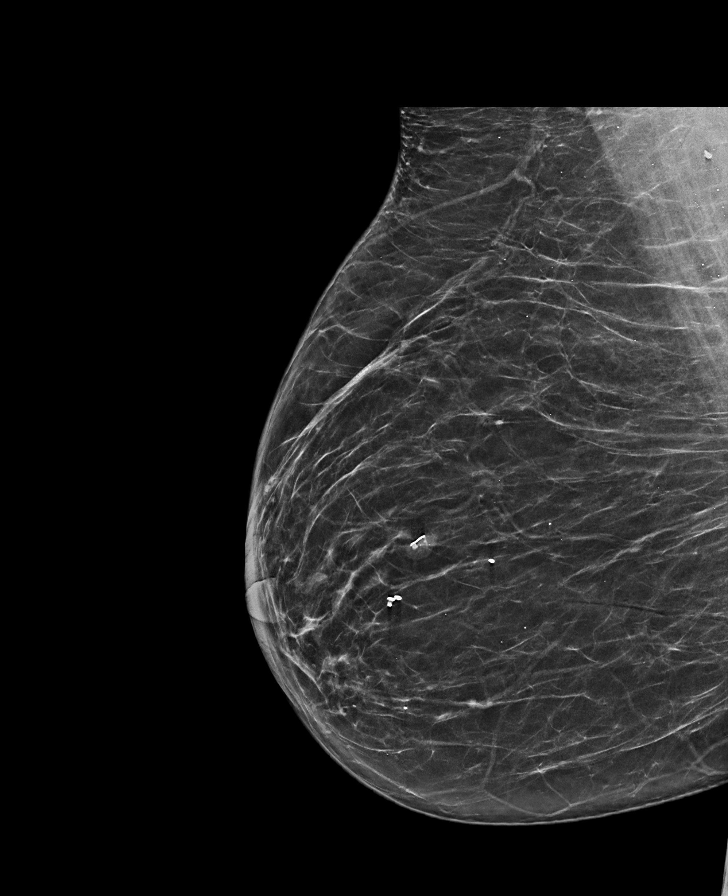

[R MLO synth-2D (2 of 2)]
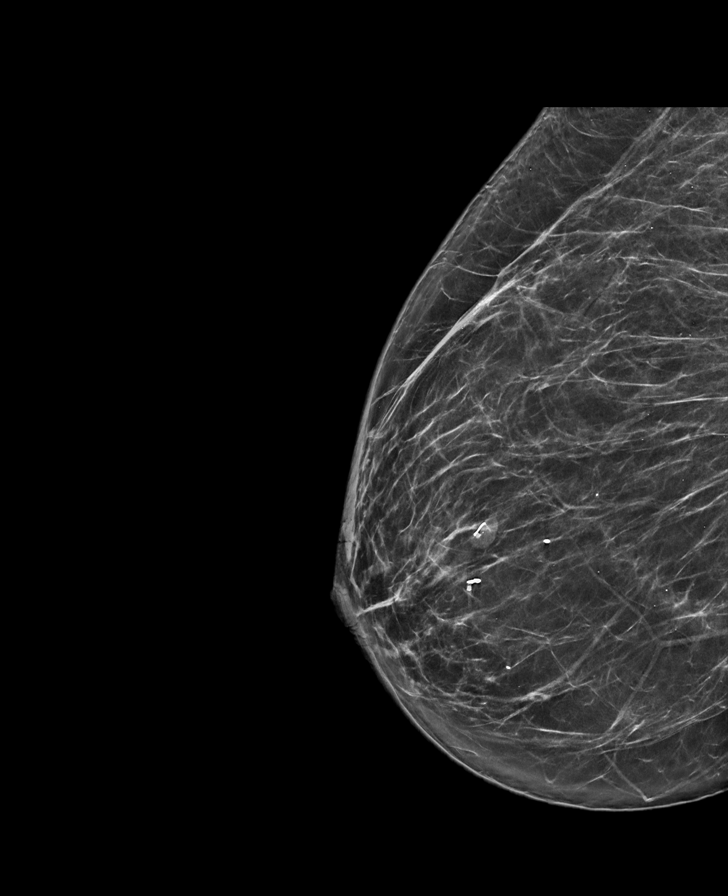

[L MLO synth-2D (1 of 3)]
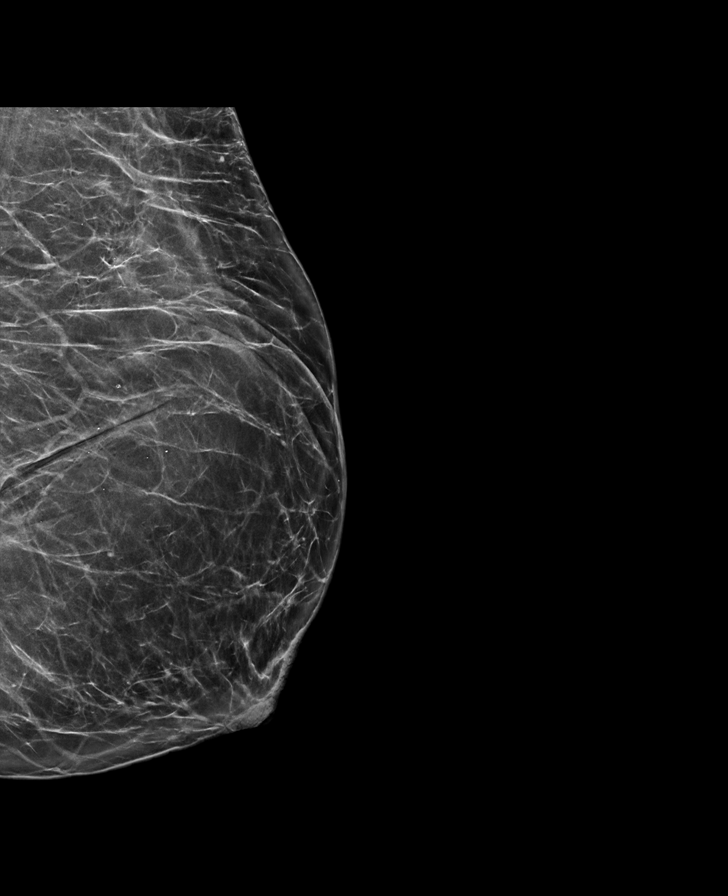

[L CC synth-2D]
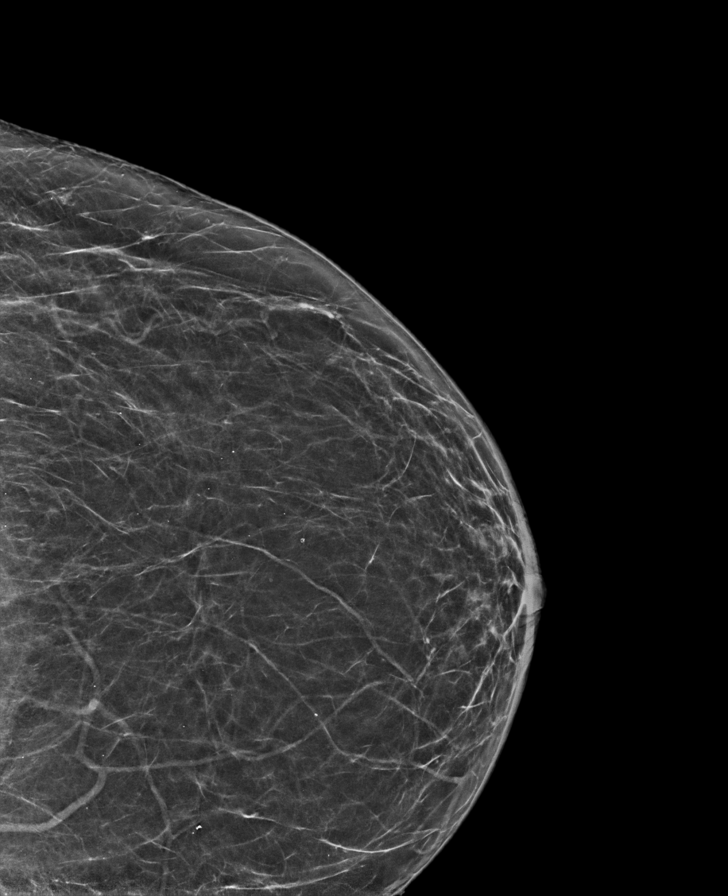

[L MLO synth-2D (2 of 3)]
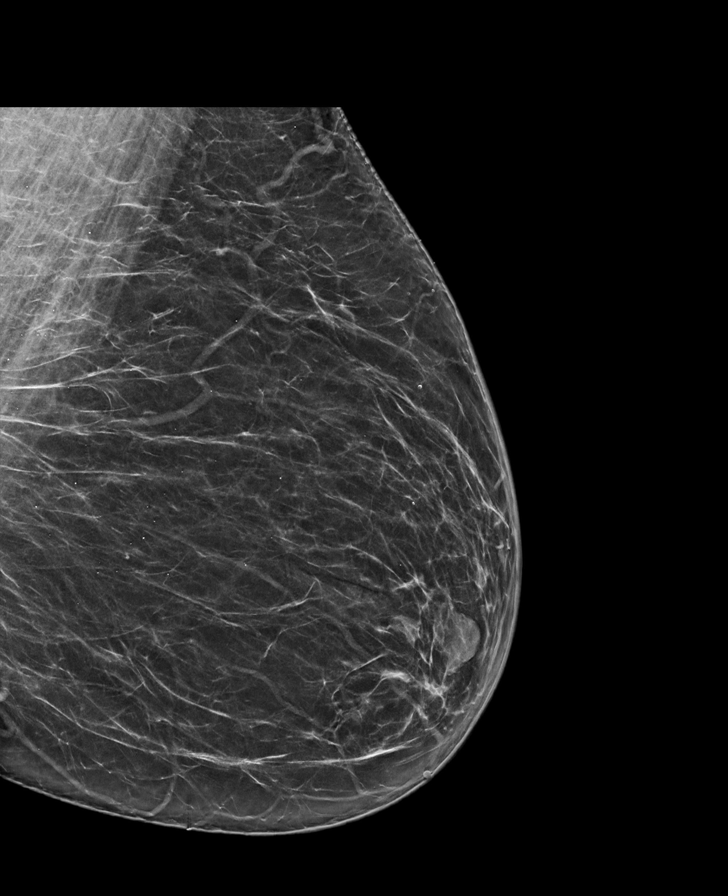

[R CC synth-2D]
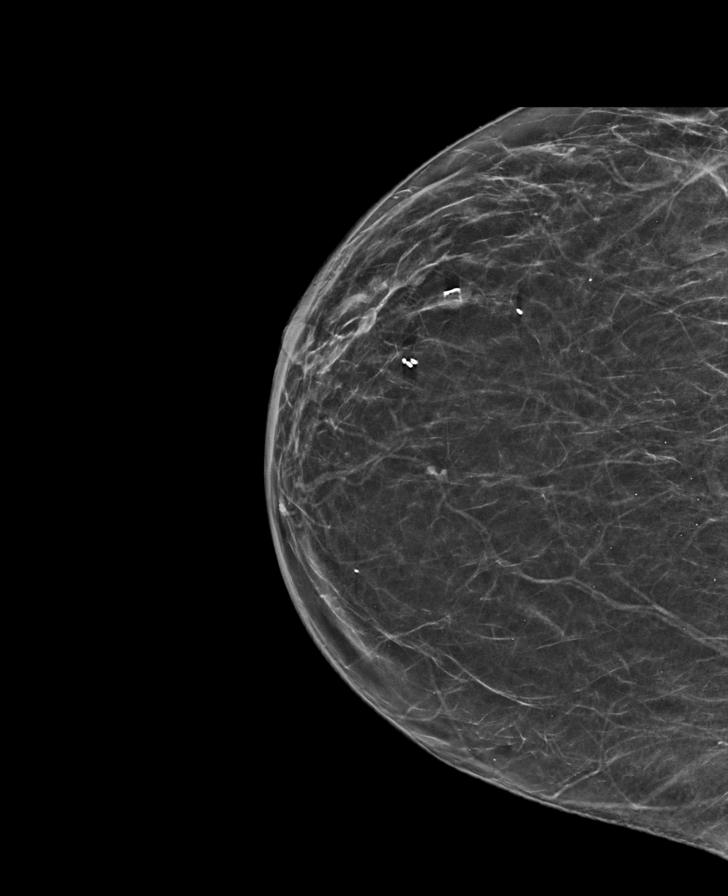

[L MLO synth-2D (3 of 3)]
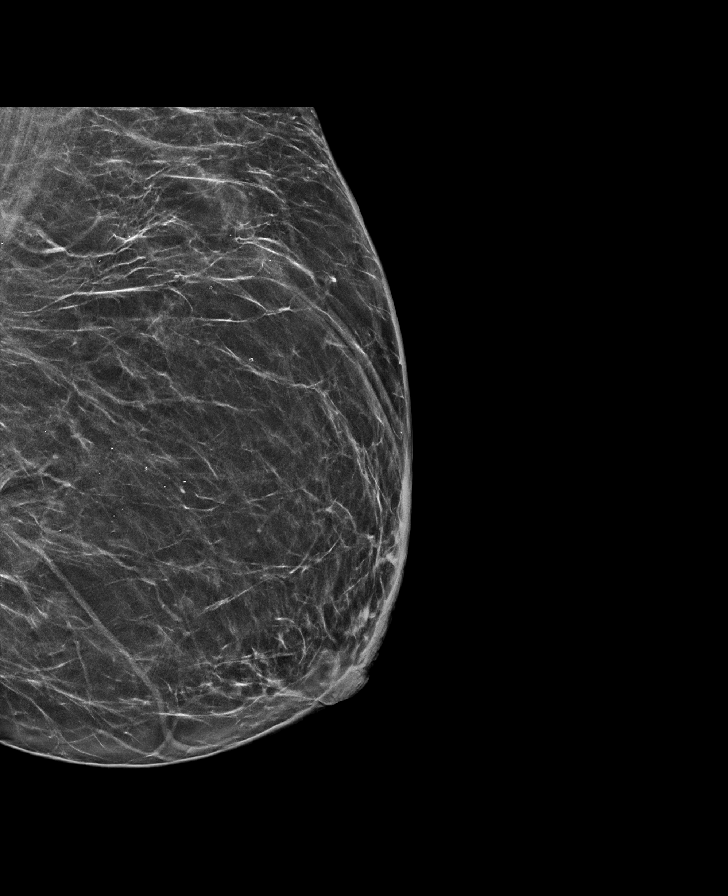

[R MLO tomo · tomo slice 37/73.0]
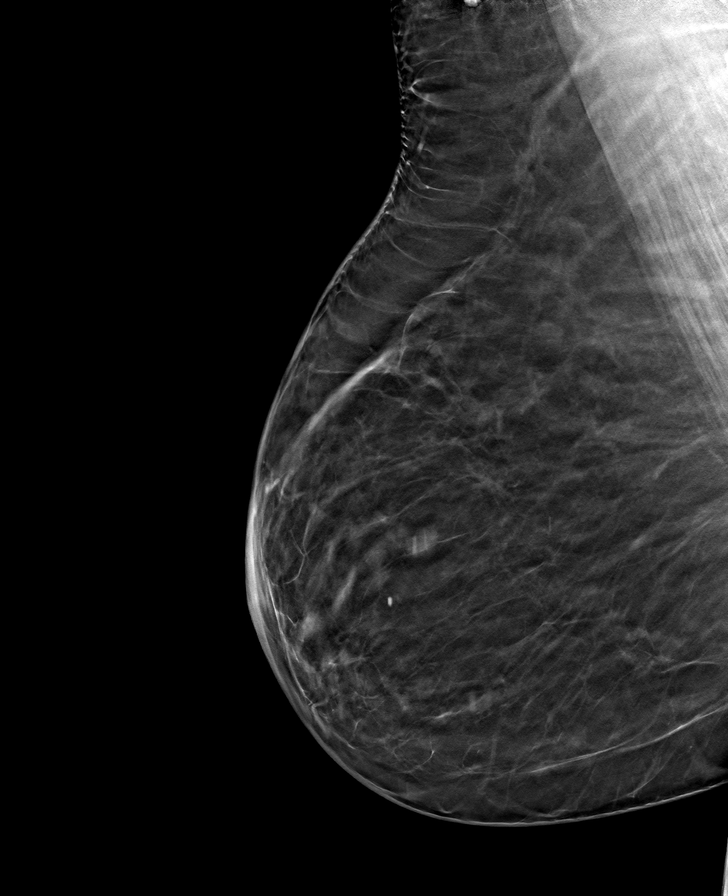

[8 of 40 positions shown; findings below may reference images not displayed]

ACR Breast Density Category b: There are scattered areas of
fibroglandular density.
FINDINGS: Asymmetric fibroglandular tissue is seen in the subareolar region of
the left breast that has a more normal appearance on nipple in
profile views. No suspicious mass or malignant type
microcalcifications identified in either breast.

Mammographic images were processed with CAD.

Targeted ultrasound is performed, showing normal tissue in the
subareolar region of the left breast. Sonographic evaluation of the
left axilla shows normal lymph nodes. No suspicious mass, distortion
or abnormal shadowing detected.
IMPRESSION: No evidence of malignancy in either breast.

RECOMMENDATION:
Bilateral screening mammogram in 1 year is recommended.

I have discussed the findings and recommendations with the patient.
If applicable, a reminder letter will be sent to the patient
regarding the next appointment.

BI-RADS CATEGORY  1: Negative.

## 2022-10-24 ENCOUNTER — Ambulatory Visit: Payer: Medicaid Other | Admitting: Obstetrics and Gynecology

## 2022-10-25 ENCOUNTER — Encounter: Payer: Medicaid Other | Attending: Physical Medicine & Rehabilitation | Admitting: Registered Nurse

## 2022-10-25 ENCOUNTER — Encounter: Payer: Self-pay | Admitting: Registered Nurse

## 2022-10-25 VITALS — BP 115/75 | HR 95 | Ht 72.0 in | Wt 260.0 lb

## 2022-10-25 DIAGNOSIS — F341 Dysthymic disorder: Secondary | ICD-10-CM | POA: Diagnosis not present

## 2022-10-25 DIAGNOSIS — G894 Chronic pain syndrome: Secondary | ICD-10-CM | POA: Insufficient documentation

## 2022-10-25 DIAGNOSIS — R000 Tachycardia, unspecified: Secondary | ICD-10-CM | POA: Diagnosis present

## 2022-10-25 DIAGNOSIS — Z5181 Encounter for therapeutic drug level monitoring: Secondary | ICD-10-CM | POA: Diagnosis not present

## 2022-10-25 DIAGNOSIS — M545 Low back pain, unspecified: Secondary | ICD-10-CM | POA: Insufficient documentation

## 2022-10-25 DIAGNOSIS — Z79891 Long term (current) use of opiate analgesic: Secondary | ICD-10-CM | POA: Insufficient documentation

## 2022-10-25 DIAGNOSIS — E1140 Type 2 diabetes mellitus with diabetic neuropathy, unspecified: Secondary | ICD-10-CM | POA: Diagnosis not present

## 2022-10-25 DIAGNOSIS — G629 Polyneuropathy, unspecified: Secondary | ICD-10-CM | POA: Diagnosis not present

## 2022-10-25 DIAGNOSIS — G8929 Other chronic pain: Secondary | ICD-10-CM | POA: Insufficient documentation

## 2022-10-25 DIAGNOSIS — Z794 Long term (current) use of insulin: Secondary | ICD-10-CM

## 2022-10-25 MED ORDER — MORPHINE SULFATE ER 30 MG PO TBCR: 30.0000 mg | EXTENDED_RELEASE_TABLET | Freq: Two times a day (BID) | ORAL | 0 refills | Status: DC

## 2022-10-25 MED ORDER — MORPHINE SULFATE 30 MG PO TABS: ORAL_TABLET | ORAL | 0 refills | Status: DC

## 2022-10-25 NOTE — Progress Notes (Signed)
Subjective:    Patient ID: [name], female    DOB: [birth year], 52 y.o.   MRN: [id number]  HPI: [name] is a 52 y.o. female who returns for follow up appointment for chronic pain and medication refill. She states her pain is located in her Bilateral Hands and Bilateral Feet with tingling and burning. She rates her pain 5. Her current exercise regime is walking and performing stretching exercises.  [name] Morphine equivalent is 120.00 MME. She is also prescribed Clonazepam .We have discussed the black box warning of using opioids and benzodiazepines. I highlighted the dangers of using these drugs together and discussed the adverse events including respiratory suppression, overdose,   Last UDS was Performed on 07/26/2022, it was consistent.      Pain Inventory Average Pain 5 Pain Right Now 5 My pain is constant, sharp, burning, dull, stabbing, tingling, and aching  In the last 24 hours, has pain interfered with the following? General activity 8 Relation with others 8 Enjoyment of life 8 What TIME of day is your pain at its worst? night Sleep (in general) Poor  Pain is worse with: walking, standing, and some activites Pain improves with: rest, medication, and massage Relief from Meds: 7  Family History  Problem Relation Age of Onset   Fibromyalgia Mother    Aortic aneurysm Mother    Colon polyps Brother    Stroke Maternal Grandmother    Colon cancer Maternal Grandmother    Heart Problems Paternal Grandmother    Dementia Paternal Grandmother    Heart attack Paternal Grandfather    Esophageal cancer Neg Hx    Rectal cancer Neg Hx    Stomach cancer Neg Hx    Social History   Socioeconomic History   Marital status: Legally Separated    Spouse name: Not on file   Number of children: 1   Years of education: Not on file   Highest education level: Bachelor's degree (e.g., BA, AB, BS)  Occupational History   Occupation: Disabled  Tobacco Use    Smoking status: Never   Smokeless tobacco: Never  Vaping Use   Vaping status: Never Used  Substance and Sexual Activity   Alcohol use: No    Alcohol/week: 0.0 standard drinks of alcohol   Drug use: No    Comment: rx opiods   Sexual activity: Yes    Birth control/protection: Condom  Other Topics Concern   Not on file  Social History Narrative   Not on file   Social Determinants of Health   Financial Resource Strain: Low Risk  (05/09/2018)   Overall Financial Resource Strain (CARDIA)    Difficulty of Paying Living Expenses: Not very hard  Food Insecurity: No Food Insecurity (05/09/2018)   Hunger Vital Sign    Worried About Running Out of Food in the Last Year: Never true    Ran Out of Food in the Last Year: Never true  Transportation Needs: No Transportation Needs (05/09/2018)   PRAPARE - Administrator, Civil Service (Medical): No    Lack of Transportation (Non-Medical): No  Physical Activity: Inactive (05/09/2018)   Exercise Vital Sign    Days of Exercise per Week: 0 days    Minutes of Exercise per Session: 0 min  Stress: No Stress Concern Present (05/09/2018)   [employer] of Occupational Health - Occupational Stress Questionnaire    Feeling of Stress : Only a little  Social Connections: Unknown (05/09/2018)   Social Connection and Isolation  Panel [NHANES]    Frequency of Communication with Friends and Family: Once a week    Frequency of Social Gatherings with Friends and Family: Once a week    Attends Religious Services: Patient declined    Database administrator or Organizations: No    Attends Engineer, structural: Never    Marital Status: Separated   Past Surgical History:  Procedure Laterality Date   ABDOMINOPLASTY     CHOLECYSTECTOMY     Past Surgical History:  Procedure Laterality Date   ABDOMINOPLASTY     CHOLECYSTECTOMY     Past Medical History:  Diagnosis Date   Anxiety    Cataract    DEPRESSION/ANXIETY 08/08/2009   Diabetes  mellitus without complication (HCC)    Hyperlipidemia    IBS 02/02/2010   Neuropathy    Polyneuropathy 12/04/2010   Renal stones    Sleep apnea 05/2021   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) 08/08/2009   LMP 05/06/2018   Opioid Risk Score:   Fall Risk Score:  `1  Depression screen PHQ 2/9     08/21/2022    3:20 PM 07/26/2022    2:29 PM 06/12/2022    3:12 PM 05/02/2022    3:31 PM 03/27/2022    3:30 PM 02/07/2022    2:40 PM 02/06/2022   11:10 AM  Depression screen PHQ 2/9  Decreased Interest 0 1 0 0 0 1 1  Down, Depressed, Hopeless 0 1 0 0 0 1 1  PHQ - 2 Score 0 2 0 0 0 2 2  Altered sleeping       3  Tired, decreased energy       3  Change in appetite       0  Feeling bad or failure about yourself        2  Trouble concentrating       0  Moving slowly or fidgety/restless       0  Suicidal thoughts       0  PHQ-9 Score       10    Review of Systems  Musculoskeletal:  Positive for gait problem.       Pain in both arms elbows down to hands, fingers Both legs form calves down to toes  All other systems reviewed and are negative.     Objective:   Physical Exam Constitutional:      Appearance: Normal appearance. She is obese.  Cardiovascular:     Rate and Rhythm: Normal rate and regular rhythm.     Pulses: Normal pulses.     Heart sounds: Normal heart sounds.  Pulmonary:     Effort: Pulmonary effort is normal.     Breath sounds: Normal breath sounds.  Musculoskeletal:     Cervical back: Normal range of motion and neck supple.     Comments: Normal Muscle Bulk and Muscle Testing Reveals:  Upper Extremities: Full ROM and Muscle Strength 5/5  Lower Extremities: Full ROM and Muscle Strength 5/5 Arises from Table slowly using cane for support Narrow Based  Gait     Skin:    General: Skin is warm and dry.  Neurological:     Mental Status: She is alert and oriented to person, place, and time.   Psychiatric:        Mood and Affect: Mood normal.        Behavior: Behavior normal.  Assessment & Plan:  1.Severe peripheral polyneuropathy of undetermined etiology.: 10/31/2022 Refilled: MS Contin 30 mg one tablet every 12 hours #60 and  MSIR 30 mg one tablet at HS, may take a tablet during the day as needed #60.We will continue the opioid monitoring program, this consists of regular clinic visits, examinations, urine drug screen, pill counts as well as use of [place] Controlled Substance Reporting system. A 12 month History has been reviewed on the [place] Controlled Substance Reporting System on 10/31/2022 2. Bilateral progressive upper extremity hand pain/ Polyneuropathy. Continue with current medication regime. Tight Glucose Control. Adhere to healthy diet regime. Continue with current medication regimen with nortriptyline: 10/31/2022 3. Muscle Spasms: Continue current medication regimen with  Zanaflex. Continue to Monitor.  10/31/2022 4. Depression/Anxiety: Continue current medication regimen with Paxil and  Klonopin. Continue to Monitor. 10/31/2022  5. Foot Ulcer: No complaints today. Ms. Eshleman reports Podiatry following her Foot Ulcer. Continue to monitor, 10/31/2022   F/U in 1 month

## 2022-10-25 NOTE — Progress Notes (Deleted)
    SUBJECTIVE:   CHIEF COMPLAINT / HPI:   T2DM Previously followed w/ parmacy team, last apt 07/31/22 w/ A1c 7.5%. Currently taking Lantus 34 units daily, Ozempic *** mg injected weekly.   PERTINENT  PMH / PSH: ***  OBJECTIVE:   LMP 05/06/2018  ***  General: NAD, pleasant, able to participate in exam Cardiac: RRR, no murmurs. Respiratory: CTAB, normal effort, No wheezes, rales or rhonchi Abdomen: Bowel sounds present, nontender, nondistended, no hepatosplenomegaly. Extremities: no edema or cyanosis. Skin: warm and dry, no rashes noted Neuro: alert, no obvious focal deficits Psych: Normal affect and mood  ASSESSMENT/PLAN:   No problem-specific Assessment & Plan notes found for this encounter.     Dr. Erick Alley, DO [facility]    {    This will disappear when note is signed, click to select method of visit    :1}

## 2022-10-26 ENCOUNTER — Encounter: Payer: Self-pay | Admitting: Student

## 2022-10-26 ENCOUNTER — Ambulatory Visit: Payer: Medicaid Other | Admitting: Student

## 2022-10-29 ENCOUNTER — Ambulatory Visit (HOSPITAL_COMMUNITY)
Admission: RE | Admit: 2022-10-29 | Payer: Medicaid Other | Source: Ambulatory Visit | Attending: Podiatry | Admitting: Podiatry

## 2022-10-30 ENCOUNTER — Encounter: Payer: Self-pay | Admitting: Gastroenterology

## 2022-10-31 ENCOUNTER — Ambulatory Visit: Payer: Medicaid Other

## 2022-10-31 ENCOUNTER — Encounter: Payer: Self-pay | Admitting: Registered Nurse

## 2022-11-02 ENCOUNTER — Telehealth: Payer: Self-pay | Admitting: Registered Nurse

## 2022-11-02 NOTE — Telephone Encounter (Signed)
Call placed to Ms. Verlon Au [place]ingham regarding her message about her mother Anson [place]. Ms. Bonetti didn't answer the call, she was instructed to take her mother to the hospital to be evaluated. This is the second appointment she canceled her mother was discharged on 10/03/2022. Call was placed to Ms. Roma Kayser no answer, 911 was called. Yancy Zubieta address not listed in EPIC, Claudean Kinds manager trying to assist with obtaining Ms. Debruyne physical address, only address listed is a [address].   Ms. Laughton return this provider call, Ms. Lasher stating she was seen by the Physical Therapist and Occupational Therapist in the home. She is now stating this is not an emergency, her mother has weakness and dizziness at times. She refused ED evaluation at this time. This provider asked for Ms. Verrilli physical address, this was not given to the provider. She asked if she could bring her mother to the office on Monday 11/05/2022. The above was discussed with Lattie Corns Ms. Larinda Buttery has been added to schedule for Monday.  Spoke with Ms. Shipley in detail, she needs to call 911 with any concerns for her mother. Ms. Milana Kidney states she is trying to take care of her Mother, Father, Daughter and herself and is overwhelmed. She was instructed to take her mother to hospital to be evaluated and to let the doctors in ED know regarding the above and the social worker will be able to assist her, she verbalizes understanding.

## 2022-11-06 ENCOUNTER — Telehealth: Payer: Self-pay | Admitting: Podiatry

## 2022-11-06 NOTE — Telephone Encounter (Signed)
Called [facility] Medicaid for vascular studies scheduled tomorrow.  Codes 01027 & [phone] do not require a prior auth.  Para March as VVS @ [place] was notified.  It was provided via automated line with insurance company (last 4 of reference # ...[id number]).  -Ameia Morency

## 2022-11-07 ENCOUNTER — Ambulatory Visit (HOSPITAL_COMMUNITY)
Admission: RE | Admit: 2022-11-07 | Payer: Medicaid Other | Source: Ambulatory Visit | Attending: Podiatry | Admitting: Podiatry

## 2022-11-08 ENCOUNTER — Telehealth: Payer: Self-pay | Admitting: Student-PharmD

## 2022-11-08 ENCOUNTER — Telehealth: Payer: Self-pay | Admitting: Gastroenterology

## 2022-11-08 NOTE — Telephone Encounter (Signed)
Patient called states she was given instructions to induce her bowels in addition to the prep however she wanted to let the office know that yesterday she was not able to have a BM even with the medication. Seeking further advise.

## 2022-11-08 NOTE — Telephone Encounter (Signed)
Patient contacted to schedule 6 month Liberate study visit. Left HIPAA compliant voicemail asking patient to call back to schedule visit with Dr. Tedra Senegal, PharmD Candidate [employer] 2025

## 2022-11-08 NOTE — Telephone Encounter (Signed)
Attempt to reach pt. LM with call back #  

## 2022-11-08 NOTE — Telephone Encounter (Signed)
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 RN .instructed pt to hydrate  by drinking  8-10 oz every hour while awake as well as to get up and ambulate often. Instructed pt to call MD on call (gave # ) if she has not had a BM 2 hours after she take first Suprep at 6 pm tonight.

## 2022-11-09 ENCOUNTER — Encounter: Payer: Self-pay | Admitting: Gastroenterology

## 2022-11-09 ENCOUNTER — Encounter: Payer: Medicaid Other | Admitting: Gastroenterology

## 2022-11-15 DIAGNOSIS — G4733 Obstructive sleep apnea (adult) (pediatric): Secondary | ICD-10-CM | POA: Diagnosis not present

## 2022-11-16 ENCOUNTER — Other Ambulatory Visit: Payer: Self-pay | Admitting: Student

## 2022-11-16 DIAGNOSIS — E781 Pure hyperglyceridemia: Secondary | ICD-10-CM

## 2022-11-20 ENCOUNTER — Encounter: Payer: Self-pay | Admitting: Student

## 2022-11-20 ENCOUNTER — Other Ambulatory Visit: Payer: Self-pay | Admitting: Family Medicine

## 2022-11-20 DIAGNOSIS — E1140 Type 2 diabetes mellitus with diabetic neuropathy, unspecified: Secondary | ICD-10-CM

## 2022-11-21 ENCOUNTER — Encounter: Payer: Medicaid Other | Admitting: Registered Nurse

## 2022-11-21 ENCOUNTER — Other Ambulatory Visit: Payer: Self-pay | Admitting: Student

## 2022-11-21 ENCOUNTER — Encounter: Payer: Self-pay | Admitting: Registered Nurse

## 2022-11-21 VITALS — BP 105/72 | HR 108 | Ht 72.0 in | Wt 253.6 lb

## 2022-11-21 DIAGNOSIS — G894 Chronic pain syndrome: Secondary | ICD-10-CM | POA: Diagnosis not present

## 2022-11-21 DIAGNOSIS — E1140 Type 2 diabetes mellitus with diabetic neuropathy, unspecified: Secondary | ICD-10-CM

## 2022-11-21 DIAGNOSIS — M545 Low back pain, unspecified: Secondary | ICD-10-CM | POA: Diagnosis not present

## 2022-11-21 DIAGNOSIS — R000 Tachycardia, unspecified: Secondary | ICD-10-CM

## 2022-11-21 DIAGNOSIS — Z79891 Long term (current) use of opiate analgesic: Secondary | ICD-10-CM | POA: Diagnosis not present

## 2022-11-21 DIAGNOSIS — Z794 Long term (current) use of insulin: Secondary | ICD-10-CM

## 2022-11-21 DIAGNOSIS — Z5181 Encounter for therapeutic drug level monitoring: Secondary | ICD-10-CM | POA: Diagnosis not present

## 2022-11-21 DIAGNOSIS — F341 Dysthymic disorder: Secondary | ICD-10-CM | POA: Diagnosis not present

## 2022-11-21 DIAGNOSIS — G8929 Other chronic pain: Secondary | ICD-10-CM

## 2022-11-21 DIAGNOSIS — F325 Major depressive disorder, single episode, in full remission: Secondary | ICD-10-CM

## 2022-11-21 MED ORDER — MORPHINE SULFATE ER 30 MG PO TBCR: 30.0000 mg | EXTENDED_RELEASE_TABLET | Freq: Two times a day (BID) | ORAL | 0 refills | Status: DC

## 2022-11-21 MED ORDER — MORPHINE SULFATE 30 MG PO TABS: ORAL_TABLET | ORAL | 0 refills | Status: DC

## 2022-11-21 MED ORDER — PAROXETINE HCL 40 MG PO TABS
40.0000 mg | ORAL_TABLET | ORAL | 0 refills | Status: DC
Start: 2022-11-21 — End: 2023-01-14

## 2022-11-21 NOTE — Progress Notes (Signed)
Subjective:    Patient ID: [name], female    DOB: 1970-12-15, 52 y.o.   MRN: [id number]  HPI: [name] is a 52 y.o. female who returns for follow up appointment for chronic pain and medication refill. She states her  pain is located in her bilateral hands and bilateral feet with tingling and burning. She also reports lower back pain, she was picking up groceries the other day, refuses X-ray at this time, will call office if she change her mind. She rates her pain 7. Her current exercise regime is walking and performing stretching exercises.  [name] Morphine equivalent is 120.00 MME. Sheis also prescribed Clonazepam .We have discussed the black box warning of using opioids and benzodiazepines. I highlighted the dangers of using these drugs together and discussed the adverse events including respiratory suppression, overdose, cognitive impairment and importance of compliance with current regimen. We will continue to monitor and adjust as indicated.   Last UDS was Performed on 07/26/2022, it was consistent.     Pain Inventory Average Pain 7 Pain Right Now 7 My pain is constant, sharp, burning, dull, stabbing, tingling, and aching  In the last 24 hours, has pain interfered with the following? General activity 8 Relation with others 8 Enjoyment of life 8 What TIME of day is your pain at its worst? evening Sleep (in general) Poor  Pain is worse with: walking, bending, and standing Pain improves with: rest and medication Relief from Meds: 9  Family History  Problem Relation Age of Onset   Stroke Mother    Fibromyalgia Mother    Aortic aneurysm Mother    Stroke Father    Colon polyps Brother    Stroke Maternal Grandmother    Colon cancer Maternal Grandmother    Heart Problems Paternal Grandmother    Dementia Paternal Grandmother    Heart attack Paternal Grandfather    Esophageal cancer Neg Hx    Rectal cancer Neg Hx    Stomach cancer Neg Hx     Social History   Socioeconomic History   Marital status: Legally Separated    Spouse name: Not on file   Number of children: 1   Years of education: Not on file   Highest education level: Bachelor's degree (e.g., BA, AB, BS)  Occupational History   Occupation: Disabled  Tobacco Use   Smoking status: Never   Smokeless tobacco: Never  Vaping Use   Vaping status: Never Used  Substance and Sexual Activity   Alcohol use: No    Alcohol/week: 0.0 standard drinks of alcohol   Drug use: No    Comment: rx opiods   Sexual activity: Yes    Birth control/protection: Condom  Other Topics Concern   Not on file  Social History Narrative   Not on file   Social Determinants of Health   Financial Resource Strain: Low Risk  (05/09/2018)   Overall Financial Resource Strain (CARDIA)    Difficulty of Paying Living Expenses: Not very hard  Food Insecurity: No Food Insecurity (05/09/2018)   Hunger Vital Sign    Worried About Running Out of Food in the Last Year: Never true    Ran Out of Food in the Last Year: Never true  Transportation Needs: No Transportation Needs (05/09/2018)   PRAPARE - Administrator, Civil Service (Medical): No    Lack of Transportation (Non-Medical): No  Physical Activity: Inactive (05/09/2018)   Exercise Vital Sign    Days of Exercise per  Week: 0 days    Minutes of Exercise per Session: 0 min  Stress: No Stress Concern Present (05/09/2018)   [employer] of Occupational Health - Occupational Stress Questionnaire    Feeling of Stress : Only a little  Social Connections: Unknown (05/09/2018)   Social Connection and Isolation Panel [NHANES]    Frequency of Communication with Friends and Family: Once a week    Frequency of Social Gatherings with Friends and Family: Once a week    Attends Religious Services: Patient declined    Database administrator or Organizations: No    Attends Engineer, structural: Never    Marital Status: Separated    Past Surgical History:  Procedure Laterality Date   ABDOMINOPLASTY     CHOLECYSTECTOMY     Past Surgical History:  Procedure Laterality Date   ABDOMINOPLASTY     CHOLECYSTECTOMY     Past Medical History:  Diagnosis Date   Anxiety    Cataract    DEPRESSION/ANXIETY 08/08/2009   Diabetes mellitus without complication (HCC)    Hyperlipidemia    IBS 02/02/2010   Neuropathy    Polyneuropathy 12/04/2010   Renal stones    Sleep apnea 05/2021   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) 08/08/2009   BP 105/72   Pulse (!) 115   Ht 6' (1.829 m)   Wt 253 lb 9.6 oz (115 kg)   LMP 05/06/2018   SpO2 94%   BMI 34.39 kg/m   Opioid Risk Score:   Fall Risk Score:  `1  Depression screen [facility] 2/9     11/21/2022    3:43 PM 08/21/2022    3:20 PM 07/26/2022    2:29 PM 06/12/2022    3:12 PM 05/02/2022    3:31 PM 03/27/2022    3:30 PM 02/07/2022    2:40 PM  Depression screen PHQ 2/9  Decreased Interest 0 0 1 0 0 0 1  Down, Depressed, Hopeless 0 0 1 0 0 0 1  PHQ - 2 Score 0 0 2 0 0 0 2     Review of Systems  Constitutional: Negative.   HENT: Negative.    Eyes: Negative.   Respiratory: Negative.    Cardiovascular: Negative.   Gastrointestinal: Negative.   Endocrine: Negative.   Genitourinary: Negative.   Musculoskeletal:  Positive for gait problem.  Skin: Negative.   Allergic/Immunologic: Negative.   Neurological:  Positive for numbness.  Hematological: Negative.   Psychiatric/Behavioral: Negative.    All other systems reviewed and are negative.      Objective:   Physical Exam Vitals and nursing note reviewed.  Constitutional:      Appearance: Normal appearance.  Cardiovascular:     Rate and Rhythm: Normal rate and regular rhythm.     Pulses: Normal pulses.     Heart sounds: Normal heart sounds.  Pulmonary:     Effort: Pulmonary effort is normal.     Breath sounds: Normal breath sounds.   Musculoskeletal:     Cervical back: Normal range of motion and neck supple.     Comments: Normal Muscle Bulk and Muscle Testing Reveals:  Upper Extremities: Full ROM and Muscle Strength 5/5  Lumbar Paraspinal Tenderness: L-4-L-5 Lower Extremities: Full ROM and Muscle Strength 5/5 Arises from Table slowly  Narrow Based Gait     Skin:    General: Skin is warm and dry.  Neurological:     Mental Status: She is alert and  oriented to person, place, and time.  Psychiatric:        Mood and Affect: Mood normal.        Behavior: Behavior normal.         Assessment & Plan:  1.Severe peripheral polyneuropathy of undetermined etiology.: 11/21/2022 Refilled: MS Contin 30 mg one tablet every 12 hours #60 and  MSIR 30 mg one tablet at HS, may take a tablet during the day as needed #60.We will continue the opioid monitoring program, this consists of regular clinic visits, examinations, urine drug screen, pill counts as well as use of [place] Controlled Substance Reporting system. A 12 month History has been reviewed on the [place] Controlled Substance Reporting System on 11/21/2022 2. Bilateral progressive upper extremity hand pain/ Polyneuropathy. Continue with current medication regime. Tight Glucose Control. Adhere to healthy diet regime. Continue with current medication regimen with nortriptyline: 11/21/2022 3. Muscle Spasms: Continue current medication regimen with  Zanaflex. Continue to Monitor.  11/21/2022 4. Depression/Anxiety: Continue current medication regimen with Paxil and  Klonopin. Continue to Monitor. 11/21/2022   F/U in 1 month

## 2022-11-22 ENCOUNTER — Ambulatory Visit (HOSPITAL_COMMUNITY)
Admission: RE | Admit: 2022-11-22 | Payer: Medicaid Other | Source: Ambulatory Visit | Attending: Podiatry | Admitting: Podiatry

## 2022-11-23 ENCOUNTER — Ambulatory Visit: Payer: Medicaid Other | Admitting: Student

## 2022-11-27 ENCOUNTER — Telehealth: Payer: Self-pay | Admitting: *Deleted

## 2022-11-27 ENCOUNTER — Ambulatory Visit: Payer: Medicaid Other | Admitting: Pharmacist

## 2022-11-27 ENCOUNTER — Encounter: Payer: Self-pay | Admitting: *Deleted

## 2022-11-27 NOTE — Telephone Encounter (Signed)
Prior auth submitted to [employer] via CoverMyMeds.

## 2022-11-27 NOTE — Telephone Encounter (Signed)
Outcome Approved today by [facility] Medicaid 2017 NCPDP Request Reference Number: [id number]. MORPHINE SUL TAB 30MG  ER is approved through 02/26/2023. For further questions, call [employer] at [phone].

## 2022-11-29 ENCOUNTER — Ambulatory Visit: Payer: Medicaid Other

## 2022-12-03 ENCOUNTER — Telehealth: Payer: Self-pay | Admitting: Pharmacist

## 2022-12-03 NOTE — Telephone Encounter (Signed)
Attempted to contact patient for follow-up for LibreStudy - missed appointments   Left HIPAA compliant voice mail requesting call back to direct phone: [phone]  Total time with patient call and documentation of interaction: 3 minutes.

## 2022-12-05 ENCOUNTER — Other Ambulatory Visit: Payer: Self-pay | Admitting: Physical Medicine & Rehabilitation

## 2022-12-06 ENCOUNTER — Telehealth: Payer: Self-pay | Admitting: Pharmacist

## 2022-12-06 NOTE — Telephone Encounter (Signed)
Patient contacted for follow-up of request for make-up appointment.  She is rescheduled for 9.26 however needs sensor to use for the next 14 days prior to that visit.   Shared request for patient to pick-up and use Libre3 sensor sample provided today. Asked patient to pick-up sensor at [facility] front desk.  Total time with patient call and documentation of interaction: 12 minutes.

## 2022-12-16 DIAGNOSIS — G4733 Obstructive sleep apnea (adult) (pediatric): Secondary | ICD-10-CM | POA: Diagnosis not present

## 2022-12-20 ENCOUNTER — Ambulatory Visit: Payer: Medicaid Other | Admitting: Pharmacist

## 2022-12-20 VITALS — BP 106/60 | HR 100 | Wt 256.6 lb

## 2022-12-20 DIAGNOSIS — E118 Type 2 diabetes mellitus with unspecified complications: Secondary | ICD-10-CM

## 2022-12-20 DIAGNOSIS — E11319 Type 2 diabetes mellitus with unspecified diabetic retinopathy without macular edema: Secondary | ICD-10-CM

## 2022-12-20 DIAGNOSIS — Z794 Long term (current) use of insulin: Secondary | ICD-10-CM

## 2022-12-20 LAB — POCT GLYCOSYLATED HEMOGLOBIN (HGB A1C): HbA1c, POC (controlled diabetic range): 6.7 % (ref 0.0–7.0)

## 2022-12-20 NOTE — Progress Notes (Signed)
S:     Chief Complaint  Patient presents with   Medication Management    Liberate CGM - 6 Month    52 y.o. female who presents for diabetes evaluation, education, and management in the context of the LIBERATE Study.  PMH is significant for Neuropathy and poorly controlled glucose.  Patient was referred and last seen by Primary Care Provider, Dr. Yetta Barre, on 02/06/2022.   Today, patient arrives in good spirits and presents without any assistance.    Family/Social History: Mother has recently had TWO strokes and patient has been highly stressed with many aspects of her health and care.   Current diabetes medications include: Ozempic (Semaglutide) 2mg  weekly, Lantus (insulin glargine) 34 units once daily.  Current hypertension medications include: enalapril  Current hyperlipidemia medications include: atprvastatin 80mg   Patient reports adherence to taking all medications as prescribed.   Do you feel that your medications are working for you? yes Have you been experiencing any side effects to the medications prescribed? no Do you have any problems obtaining medications due to transportation or finances? no Insurance coverage: Medicaid  Patient denies hypoglycemic events.  CGM Study Study visit: 6 Month Follow Up Visit  CGM Data Download date: 12/20/2022 % Time CGM Is Active: 95 % Average glucose (mg/dL): 161 mg/dL Glucose Management Indicator (%): 6.9 % Glucose Variability (%): 19.9 % Time Above Range >180 mg/dL (%): 15 % Time in Range 70-180 mg/dL (%): 85 % Time Below Range <70 mg/dL (%): 0 %  Diabetes Distress Scale Feeling like diabetes is taking up too much of my mental and physical energy every day.: A moderate problem Feeling that my doctor doesn't know enough about diabetes and diabetic care. : Not a problem Feeling angry, scared, and/or depressed when I think about living with diabetes : A slight problem Feeling that my doctor doesn't give my clear directions on  how to manage my diabetes. : Not a problem Feeling that im not testing my blood sugars frequently enough.: Not a problem Feeling that I'm often failing with my diabetes routine: A slight problem Feelling that friends and family are not supportive enough of self care efforts.: Not a problem Feeling that diabetes controls my life.: A moderate problem Feeling that my doctor doesn't take my concerns seriously enough: Not a problem Not feeling confident in my day to day ability to manage diabetes: A slight problem Feeling that I will end up with serious long term complications no matter what I do.: A moderate problem Feeling that I am not sticking closely enough to a good meal plan.: A slight problem Feeling that friends or family don't appreciate how difficult living with diabetes can be. : Not a problem Feeling overwhelmed by the demands of living with diabetes.: A slight problem Feeling that I don't have a doctor who I can see.: Not a problem Not feeling motivated to keep up my diabetes self management.: Not a problem Feeling that friends or family don't give me the emotional support that I would like. : Not a problem DDS17 Score: 28 Emotional Burden Score: 2.6 Physician related distress score: 1 Regimen Related Distress score : 1.6 Interpersonal distress score: 1   Patient reports neuropathy (nerve pain). stable   O:   Review of Systems  All other systems reviewed and are negative.   Physical Exam Constitutional:      Appearance: Normal appearance.  Pulmonary:     Effort: Pulmonary effort is normal.  Neurological:     Mental Status:  She is alert.  Psychiatric:        Mood and Affect: Mood normal.        Behavior: Behavior normal.        Thought Content: Thought content normal.        Judgment: Judgment normal.      Lab Results  Component Value Date   HGBA1C 6.7 12/20/2022    Vitals:   12/20/22 1615  BP: 106/60  Pulse: 100  SpO2: 100%     Lipid Panel      Component Value Date/Time   CHOL 130 01/15/2022 1709   TRIG 403 (H) 01/15/2022 1709   HDL 38 (L) 01/15/2022 1709   CHOLHDL 3.4 01/15/2022 1709   CHOLHDL 4.6 07/18/2015 1630   VLDL NOT CALC 07/18/2015 1630   LDLCALC 34 01/15/2022 1709   LDLDIRECT 35 06/14/2011 1555    Clinical Atherosclerotic Cardiovascular Disease (ASCVD):  The 10-year ASCVD risk score (Arnett DK, et al., 2019) is: 2.9%   Values used to calculate the score:     Age: 32 years     Sex: Female     Is Non-Hispanic African American: No     Diabetic: Yes     Tobacco smoker: No     Systolic Blood Pressure: 106 mmHg     Is BP treated: Yes     HDL Cholesterol: 38 mg/dL     Total Cholesterol: 130 mg/dL   Patient is participating in a Managed Medicaid Plan:  Yes     A/P:  LIBERATE Study:  - Patient has obtained ongoing supply of sensors through insurance [facility]) as she is now on insulin.  Diabetes longstanding currently much improved from start of study.  A1C improved from 9.9 to 6.7 in 6 months.  Patient is pleased with control and her medication regimen.  She feels the CGM has helped tremendously. She is able to verbalize appropriate hypoglycemia management plan. Medication adherence appears good. -Continued basal insulin Lantus (insulin glargine) at 34 units daily.  Patient may try to lower slightly to minimize insulin lowering as she tries to lose weight.    -Continued GLP-1 Ozempic (semaglutide) at 2mg  weekly.  -Continued metformin 1000mg  twice daily.  -Patient educated on purpose, proper use, and potential adverse effects. -Extensively discussed pathophysiology of diabetes, recommended lifestyle interventions, dietary effects on blood sugar control.  -Counseled on s/sx of and management of hypoglycemia.  -Next A1c anticipated 3-6 months for support and reinforcement of control.    Written patient instructions provided. Patient verbalized understanding of treatment plan.  Total time in face to face counseling  28 minutes.    Follow-up:  Pharmacist PRN for health maintenance and support. PCP clinic visit in within the next 4-6 weeks - patient to schedule.   Patient seen with Alesia Banda, PharmD Candidate. Marland Kitchen

## 2022-12-20 NOTE — Patient Instructions (Addendum)
It was nice to see you today!  Your goal blood sugar is 80-130 before eating and less than 180 after eating.  Medication Changes:  Continue all other medication the same.   Happy to see you again for continued support.  Please create a plan with Dr. Yetta Barre at your next visit.

## 2022-12-20 NOTE — Assessment & Plan Note (Signed)
LIBERATE Study:  - Patient has obtained ongoing supply of sensors through insurance [facility]) as she is now on insulin.  Diabetes longstanding currently much improved from start of study.  A1C improved from 9.9 to 6.7 in 6 months.  Patient is pleased with control and her medication regimen.  She feels the CGM has helped tremendously. She is able to verbalize appropriate hypoglycemia management plan. Medication adherence appears good. -Continued basal insulin Lantus (insulin glargine) at 34 units daily.  Patient may try to lower slightly to minimize insulin lowering as she tries to lose weight.    -Continued GLP-1 Ozempic (semaglutide) at 2mg  weekly.  -Continued metformin 1000mg  twice daily.  -Patient educated on purpose, proper use, and potential adverse effects. -Extensively discussed pathophysiology of diabetes, recommended lifestyle interventions, dietary effects on blood sugar control.  -Counseled on s/sx of and management of hypoglycemia.  -Next A1c anticipated 3-6 months for support and reinforcement of control.

## 2022-12-20 NOTE — Progress Notes (Signed)
Reviewed and agree with Dr Koval's plan.   

## 2022-12-21 NOTE — Progress Notes (Signed)
Reviewed and agree with Dr Koval's plan.   

## 2022-12-28 ENCOUNTER — Ambulatory Visit: Payer: Self-pay | Admitting: Student

## 2023-01-01 ENCOUNTER — Ambulatory Visit: Payer: Medicaid Other | Admitting: Student

## 2023-01-09 ENCOUNTER — Ambulatory Visit: Payer: Medicaid Other | Admitting: Gastroenterology

## 2023-01-09 ENCOUNTER — Encounter: Payer: Self-pay | Admitting: Gastroenterology

## 2023-01-14 ENCOUNTER — Other Ambulatory Visit: Payer: Self-pay | Admitting: Student

## 2023-01-14 ENCOUNTER — Ambulatory Visit: Payer: Medicaid Other

## 2023-01-14 DIAGNOSIS — F325 Major depressive disorder, single episode, in full remission: Secondary | ICD-10-CM

## 2023-01-15 ENCOUNTER — Telehealth: Payer: Self-pay

## 2023-01-15 DIAGNOSIS — E1140 Type 2 diabetes mellitus with diabetic neuropathy, unspecified: Secondary | ICD-10-CM

## 2023-01-15 DIAGNOSIS — G4733 Obstructive sleep apnea (adult) (pediatric): Secondary | ICD-10-CM | POA: Diagnosis not present

## 2023-01-15 MED ORDER — QUTENZA (4 PATCH) 8 % EX KIT: 4.0000 | PACK | Freq: Once | CUTANEOUS | 0 refills | Status: DC

## 2023-01-17 ENCOUNTER — Telehealth: Payer: Self-pay

## 2023-01-17 ENCOUNTER — Ambulatory Visit (HOSPITAL_COMMUNITY): Payer: Medicaid Other

## 2023-01-17 NOTE — Telephone Encounter (Signed)
PA submitted Qutenza

## 2023-01-18 ENCOUNTER — Other Ambulatory Visit: Payer: Self-pay | Admitting: Family Medicine

## 2023-01-18 ENCOUNTER — Other Ambulatory Visit: Payer: Self-pay | Admitting: Emergency Medicine

## 2023-01-18 ENCOUNTER — Other Ambulatory Visit: Payer: Self-pay | Admitting: Student

## 2023-01-18 DIAGNOSIS — Z1231 Encounter for screening mammogram for malignant neoplasm of breast: Secondary | ICD-10-CM

## 2023-01-21 ENCOUNTER — Encounter: Payer: Medicaid Other | Attending: Physical Medicine & Rehabilitation | Admitting: Registered Nurse

## 2023-01-21 ENCOUNTER — Encounter: Payer: Self-pay | Admitting: Registered Nurse

## 2023-01-21 VITALS — BP 129/78 | HR 93 | Ht 72.0 in | Wt 256.4 lb

## 2023-01-21 DIAGNOSIS — Z79891 Long term (current) use of opiate analgesic: Secondary | ICD-10-CM

## 2023-01-21 DIAGNOSIS — G629 Polyneuropathy, unspecified: Secondary | ICD-10-CM | POA: Diagnosis not present

## 2023-01-21 DIAGNOSIS — E1140 Type 2 diabetes mellitus with diabetic neuropathy, unspecified: Secondary | ICD-10-CM

## 2023-01-21 DIAGNOSIS — G894 Chronic pain syndrome: Secondary | ICD-10-CM

## 2023-01-21 DIAGNOSIS — Z5181 Encounter for therapeutic drug level monitoring: Secondary | ICD-10-CM

## 2023-01-21 DIAGNOSIS — Z794 Long term (current) use of insulin: Secondary | ICD-10-CM

## 2023-01-21 DIAGNOSIS — M79671 Pain in right foot: Secondary | ICD-10-CM | POA: Diagnosis not present

## 2023-01-21 DIAGNOSIS — F341 Dysthymic disorder: Secondary | ICD-10-CM | POA: Diagnosis not present

## 2023-01-21 MED ORDER — MORPHINE SULFATE 30 MG PO TABS: ORAL_TABLET | ORAL | 0 refills | Status: DC

## 2023-01-21 MED ORDER — MORPHINE SULFATE ER 30 MG PO TBCR: 30.0000 mg | EXTENDED_RELEASE_TABLET | Freq: Two times a day (BID) | ORAL | 0 refills | Status: DC

## 2023-01-21 NOTE — Progress Notes (Signed)
Subjective:    Patient ID: [name], female    DOB: 03/22/1971, 52 y.o.   MRN: [id number]  HPI: [name] is a 52 y.o. female who returns for follow up appointment for chronic pain and medication refill. She states her pain is located in her bilateral hands and bilateral feet with tingling and burning. She also reports right foot pain, she has a dressing on her right foot.  Right Foot Plantar Surface reddened with swelling, she reports it has been going on for 1-2 weeks , she states she removed a callous , she was instructed to call her podiatrist, she refused ED or Urgent Care evaluation at this time. Educated on the importance of following up with her Podiatrist or going to Urgent Care, she verbalizes understanding.   She rates her pain 7. Her current exercise regime is walking short distances, occasionally riding her stationary bicycle for 20 minutes.   [name] Morphine equivalent is 120.00 MME. She is also prescribed Clonazepam  .We have discussed the black box warning of using opioids and benzodiazepines. I highlighted the dangers of using these drugs together and discussed the adverse events including respiratory suppression, overdose, cognitive impairment and importance of compliance with current regimen. We will continue to monitor and adjust as indicated.    UDS ordered today     Pain Inventory Average Pain 7 Pain Right Now 7 My pain is constant, sharp, burning, dull, stabbing, tingling, and aching  In the last 24 hours, has pain interfered with the following? General activity 8 Relation with others 8 Enjoyment of life 8 What TIME of day is your pain at its worst? evening Sleep (in general) Poor  Pain is worse with: walking, bending, standing, and some activites Pain improves with: rest and medication Relief from Meds: 9  Family History  Problem Relation Age of Onset   Stroke Mother    Fibromyalgia Mother    Aortic aneurysm Mother    Stroke  Father    Colon polyps Brother    Stroke Maternal Grandmother    Colon cancer Maternal Grandmother    Heart Problems Paternal Grandmother    Dementia Paternal Grandmother    Heart attack Paternal Grandfather    Esophageal cancer Neg Hx    Rectal cancer Neg Hx    Stomach cancer Neg Hx    Social History   Socioeconomic History   Marital status: Legally Separated    Spouse name: Not on file   Number of children: 1   Years of education: Not on file   Highest education level: Bachelor's degree (e.g., BA, AB, BS)  Occupational History   Occupation: Disabled  Tobacco Use   Smoking status: Never   Smokeless tobacco: Never  Vaping Use   Vaping status: Never Used  Substance and Sexual Activity   Alcohol use: No    Alcohol/week: 0.0 standard drinks of alcohol   Drug use: No    Comment: rx opiods   Sexual activity: Yes    Birth control/protection: Condom  Other Topics Concern   Not on file  Social History Narrative   Not on file   Social Determinants of Health   Financial Resource Strain: High Risk (12/20/2022)   Overall Financial Resource Strain (CARDIA)    Difficulty of Paying Living Expenses: Hard  Food Insecurity: Food Insecurity Present (12/20/2022)   Hunger Vital Sign    Worried About Running Out of Food in the Last Year: Often true    Ran Out of  Food in the Last Year: Often true  Transportation Needs: No Transportation Needs (12/20/2022)   PRAPARE - Administrator, Civil Service (Medical): No    Lack of Transportation (Non-Medical): No  Physical Activity: Insufficiently Active (12/20/2022)   Exercise Vital Sign    Days of Exercise per Week: 1 day    Minutes of Exercise per Session: 20 min  Stress: Stress Concern Present (12/20/2022)   [employer] of Occupational Health - Occupational Stress Questionnaire    Feeling of Stress : Rather much  Social Connections: Socially Isolated (12/20/2022)   Social Connection and Isolation Panel [NHANES]    Frequency  of Communication with Friends and Family: Twice a week    Frequency of Social Gatherings with Friends and Family: Once a week    Attends Religious Services: Never    Database administrator or Organizations: No    Attends Engineer, structural: Not on file    Marital Status: Separated   Past Surgical History:  Procedure Laterality Date   ABDOMINOPLASTY     CHOLECYSTECTOMY     Past Surgical History:  Procedure Laterality Date   ABDOMINOPLASTY     CHOLECYSTECTOMY     Past Medical History:  Diagnosis Date   Anxiety    Cataract    DEPRESSION/ANXIETY 08/08/2009   Diabetes mellitus without complication (HCC)    Hyperlipidemia    IBS 02/02/2010   Neuropathy    Polyneuropathy 12/04/2010   Renal stones    Sleep apnea 05/2021   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) 08/08/2009   BP 129/78   Pulse 93   Ht 6' (1.829 m)   Wt 256 lb 6.4 oz (116.3 kg)   LMP 05/06/2018   SpO2 98%   BMI 34.77 kg/m   Opioid Risk Score:   Fall Risk Score:  `1  Depression screen [facility] 2/9     01/21/2023    3:25 PM 11/21/2022    3:43 PM 08/21/2022    3:20 PM 07/26/2022    2:29 PM 06/12/2022    3:12 PM 05/02/2022    3:31 PM 03/27/2022    3:30 PM  Depression screen PHQ 2/9  Decreased Interest 0 0 0 1 0 0 0  Down, Depressed, Hopeless 0 0 0 1 0 0 0  PHQ - 2 Score 0 0 0 2 0 0 0     Review of Systems  Constitutional: Negative.   HENT: Negative.    Eyes: Negative.   Respiratory: Negative.    Cardiovascular: Negative.   Gastrointestinal: Negative.   Endocrine: Negative.   Genitourinary: Negative.   Musculoskeletal:  Positive for gait problem.  Skin:  Positive for wound.       Possible wound on foot  Allergic/Immunologic: Negative.   Hematological: Negative.   Psychiatric/Behavioral: Negative.    All other systems reviewed and are negative.      Objective:   Physical Exam Vitals and nursing note reviewed.   Constitutional:      Appearance: Normal appearance.  Cardiovascular:     Rate and Rhythm: Normal rate and regular rhythm.     Pulses: Normal pulses.     Heart sounds: Normal heart sounds.  Pulmonary:     Effort: Pulmonary effort is normal.     Breath sounds: Normal breath sounds.  Musculoskeletal:     Cervical back: Normal range of motion and neck supple.     Comments: Normal Muscle Bulk and Muscle  Testing Reveals:  Upper Extremities: Full ROM and Muscle Strength 5/5 Lower Extremities: Full ROM and Muscle Strength 5/5 Arises from Table slowly using cane for support Narrow Based  Gait     Skin:    Comments: Right Foot: Plantar Surface: Reddened with swelling noted   Neurological:     Mental Status: She is alert and oriented to person, place, and time.  Psychiatric:        Mood and Affect: Mood normal.        Behavior: Behavior normal.         Assessment & Plan:  1.Severe peripheral polyneuropathy of undetermined etiology.: 01/21/2023 Refilled: MS Contin 30 mg one tablet every 12 hours #60 and  MSIR 30 mg one tablet at HS, may take a tablet during the day as needed #60.We will continue the opioid monitoring program, this consists of regular clinic visits, examinations, urine drug screen, pill counts as well as use of [place] Controlled Substance Reporting system. A 12 month History has been reviewed on the [place] Controlled Substance Reporting System on 01/21/2023 2. Bilateral progressive upper extremity hand pain/ Polyneuropathy. Continue with current medication regime. Tight Glucose Control. Adhere to healthy diet regime. Continue with current medication regimen with nortriptyline: 01/21/2023 3. Muscle Spasms: Continue current medication regimen with  Zanaflex. Continue to Monitor.  01/21/2023 4. Depression/Anxiety: Continue current medication regimen with Paxil and  Klonopin. Continue to Monitor. 01/21/2023 5. Right Foot Pain: Refuses Ed or Urgent Care Evaluation.  She will call her Podiatrist she reports. Educated on the importance of having her Right foot assess, she verbalizes understanding.   F/U in 1 month

## 2023-01-22 NOTE — Telephone Encounter (Signed)
Denied on October 25 by [facility] Medicaid 2017 NCPDP Request Reference Number: [id number]. QUTENZA KIT 8% 4-PCH is denied for not meeting the prior authorization requirement(s). For further questions, call Community & State at [phone] for more information.

## 2023-01-24 ENCOUNTER — Telehealth: Payer: Self-pay | Admitting: Registered Nurse

## 2023-01-24 ENCOUNTER — Other Ambulatory Visit: Payer: Self-pay | Admitting: Registered Nurse

## 2023-01-24 DIAGNOSIS — F341 Dysthymic disorder: Secondary | ICD-10-CM

## 2023-01-24 LAB — TOXASSURE SELECT,+ANTIDEPR,UR

## 2023-01-24 MED ORDER — CLONAZEPAM 0.5 MG PO TABS: ORAL_TABLET | ORAL | 3 refills | Status: DC

## 2023-01-24 NOTE — Telephone Encounter (Signed)
PMP was Reviewed.  Clonazepam e-scribed to pharmacy.

## 2023-01-25 ENCOUNTER — Telehealth: Payer: Self-pay | Admitting: *Deleted

## 2023-01-25 ENCOUNTER — Encounter: Payer: Self-pay | Admitting: *Deleted

## 2023-01-25 NOTE — Telephone Encounter (Signed)
Outcome Approved today by [facility] Medicaid 2017 NCPDP Request Reference Number: [id number]. MORPHINE SUL TAB 30MG  is approved through 07/25/2023.

## 2023-01-25 NOTE — Telephone Encounter (Signed)
Prior auth for Morphine Sulfate IR 30 mg #60 submitted to [employer] via [employer].

## 2023-01-28 ENCOUNTER — Other Ambulatory Visit: Payer: Self-pay | Admitting: *Deleted

## 2023-01-28 DIAGNOSIS — E118 Type 2 diabetes mellitus with unspecified complications: Secondary | ICD-10-CM

## 2023-01-28 MED ORDER — FREESTYLE LIBRE 3 SENSOR MISC: 11 refills | Status: DC

## 2023-01-29 ENCOUNTER — Encounter: Payer: Self-pay | Admitting: Student

## 2023-01-29 ENCOUNTER — Other Ambulatory Visit: Payer: Self-pay | Admitting: Student

## 2023-01-29 ENCOUNTER — Encounter: Payer: Medicaid Other | Admitting: Physical Medicine and Rehabilitation

## 2023-01-29 ENCOUNTER — Telehealth: Payer: Self-pay | Admitting: Pharmacist

## 2023-01-29 MED ORDER — FREESTYLE LIBRE 3 PLUS SENSOR MISC: 11 refills | Status: DC

## 2023-01-29 NOTE — Telephone Encounter (Signed)
Patient contacts office requesting change from [place] 3  sensor to [place] plus  01/25/2023 at 5:11 AM  Patient contacts office requesting change from [place] 3  sensor to [place] plus  01/28/2023 at 5:09 AM  New prescription for Libre 3+ be sent to [facility]   01/29/2023 New prescription sent to pharmacy per her request.   Returned call and left HIPAA compliant voice mail informing of refill for requested item.     Total time with patient call and documentation of interaction: 11 minutes.

## 2023-01-30 NOTE — Telephone Encounter (Signed)
Reviewed and agree with Dr Koval's plan.   

## 2023-02-01 ENCOUNTER — Other Ambulatory Visit (HOSPITAL_COMMUNITY): Payer: Self-pay

## 2023-02-01 ENCOUNTER — Encounter: Payer: Self-pay | Admitting: Student

## 2023-02-01 ENCOUNTER — Telehealth: Payer: Self-pay

## 2023-02-01 NOTE — Telephone Encounter (Signed)
Pharmacy Patient Advocate Encounter   Received notification from Patient Advice Request messages that prior authorization for FREESTYLE LIBRE 3 PLUS SENSOR is required/requested.   Insurance verification completed.   The patient is insured through [facility] MEDICAID .   Per test claim: PA required; PA submitted to above mentioned insurance via CoverMyMeds Key/confirmation #/EOC [id number]. Status is pending

## 2023-02-05 ENCOUNTER — Other Ambulatory Visit: Payer: Self-pay | Admitting: Family Medicine

## 2023-02-05 ENCOUNTER — Other Ambulatory Visit (HOSPITAL_COMMUNITY): Payer: Self-pay

## 2023-02-05 DIAGNOSIS — E118 Type 2 diabetes mellitus with unspecified complications: Secondary | ICD-10-CM

## 2023-02-05 NOTE — Telephone Encounter (Signed)
Covermymeds is saying the determination is N/A.  Reports using a DME company? Is this something can be done through parachute?

## 2023-02-06 ENCOUNTER — Ambulatory Visit: Payer: Medicaid Other | Admitting: Obstetrics & Gynecology

## 2023-02-08 ENCOUNTER — Other Ambulatory Visit (HOSPITAL_COMMUNITY): Payer: Self-pay

## 2023-02-08 NOTE — Telephone Encounter (Signed)
[facility] contacted patient, they were able to fill CGM supplies. Patient confirmed she picked them up.

## 2023-02-11 DIAGNOSIS — Z1231 Encounter for screening mammogram for malignant neoplasm of breast: Secondary | ICD-10-CM

## 2023-02-14 ENCOUNTER — Encounter: Payer: Medicaid Other | Admitting: Registered Nurse

## 2023-02-15 DIAGNOSIS — G4733 Obstructive sleep apnea (adult) (pediatric): Secondary | ICD-10-CM | POA: Diagnosis not present

## 2023-02-18 DIAGNOSIS — G4733 Obstructive sleep apnea (adult) (pediatric): Secondary | ICD-10-CM | POA: Diagnosis not present

## 2023-02-18 NOTE — Progress Notes (Deleted)
    SUBJECTIVE:   CHIEF COMPLAINT / HPI:   T2DM Last A1c 6.7 in September.  Currently taking Lantus 34 units daily, metformin 1000 mg twice daily, Ozempic 2 mg injected weekly.    HTN Currently taking enalapril 10 mg daily,  Hypertriglyceridemia She is on atorvastatin and fenofibrate 145 mg daily.  Last lipid panel a year ago with LDL 34 and triglycerides 4 3.  PERTINENT  PMH / PSH: ***  OBJECTIVE:   LMP 05/06/2018  ***  General: NAD, pleasant, able to participate in exam Cardiac: RRR, no murmurs. Respiratory: CTAB, normal effort, No wheezes, rales or rhonchi Abdomen: Bowel sounds present, nontender, nondistended, no hepatosplenomegaly. Extremities: no edema or cyanosis. Skin: warm and dry, no rashes noted Neuro: alert, no obvious focal deficits Psych: Normal affect and mood  ASSESSMENT/PLAN:   No problem-specific Assessment & Plan notes found for this encounter.     Dr. Erick Alley, DO [facility]    {    This will disappear when note is signed, click to select method of visit    :1}

## 2023-02-19 ENCOUNTER — Encounter: Payer: Self-pay | Admitting: Student

## 2023-02-19 ENCOUNTER — Ambulatory Visit: Payer: Medicaid Other | Admitting: Student

## 2023-02-19 DIAGNOSIS — E781 Pure hyperglyceridemia: Secondary | ICD-10-CM

## 2023-02-19 DIAGNOSIS — E118 Type 2 diabetes mellitus with unspecified complications: Secondary | ICD-10-CM

## 2023-02-20 ENCOUNTER — Encounter: Payer: Medicaid Other | Admitting: Registered Nurse

## 2023-02-22 ENCOUNTER — Other Ambulatory Visit: Payer: Self-pay

## 2023-02-25 ENCOUNTER — Telehealth: Payer: Self-pay | Admitting: Registered Nurse

## 2023-02-25 MED ORDER — MORPHINE SULFATE ER 30 MG PO TBCR: 30.0000 mg | EXTENDED_RELEASE_TABLET | Freq: Two times a day (BID) | ORAL | 0 refills | Status: DC

## 2023-02-25 MED ORDER — MORPHINE SULFATE 30 MG PO TABS: ORAL_TABLET | ORAL | 0 refills | Status: DC

## 2023-02-25 NOTE — Telephone Encounter (Signed)
PMP was Reviewed.  Morphine IR and Morphine ER refilled, [name] is aware of the above via My-Chart.

## 2023-02-26 NOTE — Progress Notes (Unsigned)
Subjective:    Patient ID: [name], female    DOB: 1971/02/08, 52 y.o.   MRN: [id number]  HPI   Pain Inventory Average Pain {NUMBERS; 0-10:5044} Pain Right Now {NUMBERS; 0-10:5044} My pain is {PAIN DESCRIPTION:21022940}  In the last 24 hours, has pain interfered with the following? General activity {NUMBERS; 0-10:5044} Relation with others {NUMBERS; 0-10:5044} Enjoyment of life {NUMBERS; 0-10:5044} What TIME of day is your pain at its worst? {time of day:24191} Sleep (in general) {BHH GOOD/FAIR/POOR:22877}  Pain is worse with: {ACTIVITIES:21022942} Pain improves with: {PAIN IMPROVES WUJW:11914782} Relief from Meds: {NUMBERS; 0-10:5044}  Family History  Problem Relation Age of Onset   Stroke Mother    Fibromyalgia Mother    Aortic aneurysm Mother    Stroke Father    Colon polyps Brother    Stroke Maternal Grandmother    Colon cancer Maternal Grandmother    Heart Problems Paternal Grandmother    Dementia Paternal Grandmother    Heart attack Paternal Grandfather    Esophageal cancer Neg Hx    Rectal cancer Neg Hx    Stomach cancer Neg Hx    Social History   Socioeconomic History   Marital status: Legally Separated    Spouse name: Not on file   Number of children: 1   Years of education: Not on file   Highest education level: Bachelor's degree (e.g., BA, AB, BS)  Occupational History   Occupation: Disabled  Tobacco Use   Smoking status: Never   Smokeless tobacco: Never  Vaping Use   Vaping status: Never Used  Substance and Sexual Activity   Alcohol use: No    Alcohol/week: 0.0 standard drinks of alcohol   Drug use: No    Comment: rx opiods   Sexual activity: Yes    Birth control/protection: Condom  Other Topics Concern   Not on file  Social History Narrative   Not on file   Social Determinants of Health   Financial Resource Strain: High Risk (12/20/2022)   Overall Financial Resource Strain (CARDIA)    Difficulty of Paying Living Expenses:  Hard  Food Insecurity: Food Insecurity Present (12/20/2022)   Hunger Vital Sign    Worried About Running Out of Food in the Last Year: Often true    Ran Out of Food in the Last Year: Often true  Transportation Needs: No Transportation Needs (12/20/2022)   PRAPARE - Administrator, Civil Service (Medical): No    Lack of Transportation (Non-Medical): No  Physical Activity: Insufficiently Active (12/20/2022)   Exercise Vital Sign    Days of Exercise per Week: 1 day    Minutes of Exercise per Session: 20 min  Stress: Stress Concern Present (12/20/2022)   [employer] of Occupational Health - Occupational Stress Questionnaire    Feeling of Stress : Rather much  Social Connections: Socially Isolated (12/20/2022)   Social Connection and Isolation Panel [NHANES]    Frequency of Communication with Friends and Family: Twice a week    Frequency of Social Gatherings with Friends and Family: Once a week    Attends Religious Services: Never    Database administrator or Organizations: No    Attends Engineer, structural: Not on file    Marital Status: Separated   Past Surgical History:  Procedure Laterality Date   ABDOMINOPLASTY     CHOLECYSTECTOMY     Past Surgical History:  Procedure Laterality Date   ABDOMINOPLASTY     CHOLECYSTECTOMY     Past  Medical History:  Diagnosis Date   Anxiety    Cataract    DEPRESSION/ANXIETY 08/08/2009   Diabetes mellitus without complication (HCC)    Hyperlipidemia    IBS 02/02/2010   Neuropathy    Polyneuropathy 12/04/2010   Renal stones    Sleep apnea 05/2021   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) 08/08/2009   LMP 05/06/2018   Opioid Risk Score:   Fall Risk Score:  `1  Depression screen [facility] 2/9     01/21/2023    3:25 PM 11/21/2022    3:43 PM 08/21/2022    3:20 PM 07/26/2022    2:29 PM 06/12/2022    3:12 PM 05/02/2022    3:31 PM 03/27/2022    3:30  PM  Depression screen PHQ 2/9  Decreased Interest 0 0 0 1 0 0 0  Down, Depressed, Hopeless 0 0 0 1 0 0 0  PHQ - 2 Score 0 0 0 2 0 0 0    Review of Systems     Objective:   Physical Exam        Assessment & Plan:

## 2023-02-27 ENCOUNTER — Encounter: Payer: Medicaid Other | Admitting: Registered Nurse

## 2023-02-28 ENCOUNTER — Encounter (HOSPITAL_COMMUNITY): Payer: Self-pay | Admitting: Podiatry

## 2023-02-28 ENCOUNTER — Other Ambulatory Visit (HOSPITAL_COMMUNITY): Payer: Self-pay | Admitting: Podiatry

## 2023-02-28 DIAGNOSIS — I739 Peripheral vascular disease, unspecified: Secondary | ICD-10-CM

## 2023-03-07 ENCOUNTER — Ambulatory Visit (HOSPITAL_COMMUNITY): Admission: RE | Admit: 2023-03-07 | Payer: Medicaid Other | Source: Ambulatory Visit

## 2023-03-13 ENCOUNTER — Encounter: Payer: Medicaid Other | Admitting: Registered Nurse

## 2023-03-14 ENCOUNTER — Encounter: Payer: Self-pay | Admitting: Student

## 2023-03-14 DIAGNOSIS — F325 Major depressive disorder, single episode, in full remission: Secondary | ICD-10-CM

## 2023-03-14 MED ORDER — PAROXETINE HCL 40 MG PO TABS: 40.0000 mg | ORAL_TABLET | ORAL | 0 refills | Status: DC

## 2023-03-14 NOTE — Telephone Encounter (Signed)
Medication pended to this encounter.   Talbot Grumbling, RN

## 2023-03-15 ENCOUNTER — Ambulatory Visit (INDEPENDENT_AMBULATORY_CARE_PROVIDER_SITE_OTHER): Payer: Medicaid Other | Admitting: Student

## 2023-03-15 ENCOUNTER — Encounter: Payer: Self-pay | Admitting: Student

## 2023-03-15 VITALS — BP 109/68 | HR 98 | Ht 72.0 in | Wt 256.8 lb

## 2023-03-15 DIAGNOSIS — Z7985 Long-term (current) use of injectable non-insulin antidiabetic drugs: Secondary | ICD-10-CM | POA: Diagnosis present

## 2023-03-15 DIAGNOSIS — R110 Nausea: Secondary | ICD-10-CM

## 2023-03-15 DIAGNOSIS — E781 Pure hyperglyceridemia: Secondary | ICD-10-CM

## 2023-03-15 DIAGNOSIS — E118 Type 2 diabetes mellitus with unspecified complications: Secondary | ICD-10-CM | POA: Diagnosis not present

## 2023-03-15 DIAGNOSIS — Z8639 Personal history of other endocrine, nutritional and metabolic disease: Secondary | ICD-10-CM

## 2023-03-15 DIAGNOSIS — Z23 Encounter for immunization: Secondary | ICD-10-CM

## 2023-03-15 LAB — POCT GLYCOSYLATED HEMOGLOBIN (HGB A1C): HbA1c, POC (controlled diabetic range): 6.6 % (ref 0.0–7.0)

## 2023-03-15 MED ORDER — ZOSTER VAC RECOMB ADJUVANTED 50 MCG/0.5ML IM SUSR
0.5000 mL | Freq: Once | INTRAMUSCULAR | 0 refills | Status: AC
Start: 2023-03-15 — End: 2023-03-15

## 2023-03-15 NOTE — Patient Instructions (Signed)
It was great to see you! Thank you for allowing me to participate in your care!  I recommend that you always bring your medications to each appointment as this makes it easy to ensure you are on the correct medications and helps [place] not miss when refills are needed.  Our plans for today:  - Schedule colonoscopy - go to pharmacy for shingles vaccine  - Return in ~ 3 months for check up  - get apt with podiatrist ASAP   We are checking some labs today, I will call you if they are abnormal will send you a MyChart message or a letter if they are normal.  If you do not hear about your labs in the next 2 weeks please let us know.  Take care and seek immediate care sooner if you develop any concerns.   Dr. Erick Alley, DO [facility]

## 2023-03-15 NOTE — Progress Notes (Unsigned)
    SUBJECTIVE:   CHIEF COMPLAINT / HPI:   T2DM A1c 6.6 today  Taking Ozempic 2 mg weekly and metformin 1000 BID, Lantus 34 U daily Has CGM and states sometimes it will alert that her sugars are low however she will recheck with a fingerstick and confirm that her blood sugar is not low. Fasting blood sugar ~100, post prandial 180-220  Nausea Patient felt nauseous and generally unwell over the last 2 weeks.  Symptoms have significantly improved and almost completely gone today. No fever, vomiting, diarrhea and is able to eat.  Thinks it was related to not taking Paxil for past 2 weeks.  She did not realize that she had been out of her Paxil and not taking it until she recently talked to the pharmacist.  Started back on paxil last night.   Patient requests labs to evaluate liver, kidney function, blood counts and vitamin D  PERTINENT  PMH / PSH: Depression, chronic pain, T2DM, neuropathy  OBJECTIVE:   BP 109/68   Pulse 98   Ht 6' (1.829 m)   Wt 256 lb 12.8 oz (116.5 kg)   LMP 05/06/2018   SpO2 99%   BMI 34.83 kg/m    General: NAD, pleasant, able to participate in exam Cardiac: Well-perfused Respiratory: Breathing comfortably on room air Abdomen: Bowel sounds present, nontender, nondistended, no hepatosplenomegaly. Extremities: Scabbed over lesions on lateral third and fourth right toes, no erythema, edema or drainage.  Unable to feel monofilament testing bilaterally.  Dorsalis pedis and posterior tibial pulses were palpable bilaterally Neuro: alert, no obvious focal deficits Psych: Normal affect and mood   ASSESSMENT/PLAN:   Type 2 diabetes mellitus with complication, without long-term current use of insulin (HCC) Well-controlled with fasting sugars in low 100s. continue current regimen as in HPI. -UACR -CMP to check kidney function.  Per patient request, we will make it a CMP to also assess liver function -Foot exam completed today, sensation not intact and with scabbed  over noninfected wounds of right foot, patient to see a podiatrist ASAP and continue to monitor for signs of infection  Nausea Has almost completely resolved.  Agree with patient that symptoms could be related to Paxil withdrawal.  Patient questioned if she could have had pancreatitis, discussed this is unlikely considering she never had vomiting or abdominal pain.  No need for lipase at this time.  Per patient request, will check CBC and CMP to assess liver/biliary function. patient to return if symptoms return.  History of vitamin D deficiency Currently taking vitamin D supplement.  Will check vitamin D level today  Hypertriglyceridemia Lipid panel today Continue fenofibrate and atorvastatin   At end of visit, patient noted her hair has been thinning over the past year and daughter has had with patient.  Discussed with patient that she will need another appointment to further discuss this if desired.  Health maintenance Shingrix vaccine ordered Colonoscopy has been previously ordered, patient plans to schedule appointment  Dr. Erick Alley, DO [facility]

## 2023-03-16 LAB — COMPREHENSIVE METABOLIC PANEL
ALT: 26 [IU]/L (ref 0–32)
AST: 17 [IU]/L (ref 0–40)
Albumin: 4.3 g/dL (ref 3.8–4.9)
Alkaline Phosphatase: 57 [IU]/L (ref 44–121)
BUN/Creatinine Ratio: 22 (ref 9–23)
BUN: 15 mg/dL (ref 6–24)
Bilirubin Total: 0.3 mg/dL (ref 0.0–1.2)
CO2: 23 mmol/L (ref 20–29)
Calcium: 9.6 mg/dL (ref 8.7–10.2)
Chloride: 103 mmol/L (ref 96–106)
Creatinine, Ser: 0.68 mg/dL (ref 0.57–1.00)
Globulin, Total: 2.6 g/dL (ref 1.5–4.5)
Glucose: 162 mg/dL — ABNORMAL HIGH (ref 70–99)
Potassium: 4.2 mmol/L (ref 3.5–5.2)
Sodium: 141 mmol/L (ref 134–144)
Total Protein: 6.9 g/dL (ref 6.0–8.5)
eGFR: 103 mL/min/{1.73_m2} (ref >59)

## 2023-03-16 LAB — CBC
Hematocrit: 42.6 % (ref 34.0–46.6)
Hemoglobin: 13.6 g/dL (ref 11.1–15.9)
MCH: 27.3 pg (ref 26.6–33.0)
MCHC: 31.9 g/dL (ref 31.5–35.7)
MCV: 85 fL (ref 79–97)
Platelets: 342 10*3/uL (ref 150–450)
RBC: 4.99 x10E6/uL (ref 3.77–5.28)
RDW: 14 % (ref 11.7–15.4)
WBC: 7.1 10*3/uL (ref 3.4–10.8)

## 2023-03-16 LAB — MICROALBUMIN / CREATININE URINE RATIO
Creatinine, Urine: 263.7 mg/dL
Microalb/Creat Ratio: 118 mg/g{creat} — ABNORMAL HIGH (ref 0–29)
Microalbumin, Urine: 310.6 ug/mL

## 2023-03-16 LAB — LIPID PANEL
Chol/HDL Ratio: 2.9 {ratio} (ref 0.0–4.4)
Cholesterol, Total: 134 mg/dL (ref 100–199)
HDL: 46 mg/dL (ref >39)
LDL Chol Calc (NIH): 53 mg/dL (ref 0–99)
Triglycerides: 222 mg/dL — ABNORMAL HIGH (ref 0–149)
VLDL Cholesterol Cal: 35 mg/dL (ref 5–40)

## 2023-03-16 LAB — VITAMIN D 25 HYDROXY (VIT D DEFICIENCY, FRACTURES): Vit D, 25-Hydroxy: 38.3 ng/mL (ref 30.0–100.0)

## 2023-03-17 ENCOUNTER — Encounter: Payer: Self-pay | Admitting: Student

## 2023-03-17 DIAGNOSIS — G4733 Obstructive sleep apnea (adult) (pediatric): Secondary | ICD-10-CM | POA: Diagnosis not present

## 2023-03-17 DIAGNOSIS — R110 Nausea: Secondary | ICD-10-CM | POA: Insufficient documentation

## 2023-03-17 NOTE — Assessment & Plan Note (Addendum)
Has almost completely resolved.  Agree with patient that symptoms could be related to Paxil withdrawal.  Patient questioned if she could have had pancreatitis, discussed this is unlikely considering she never had vomiting or abdominal pain.  No need for lipase at this time.  Per patient request, will check CBC and CMP to assess liver/biliary function. patient to return if symptoms return.

## 2023-03-17 NOTE — Assessment & Plan Note (Signed)
Lipid panel today Continue fenofibrate and atorvastatin

## 2023-03-17 NOTE — Assessment & Plan Note (Signed)
Currently taking vitamin D supplement.  Will check vitamin D level today

## 2023-03-17 NOTE — Assessment & Plan Note (Signed)
Well-controlled with fasting sugars in low 100s. continue current regimen as in HPI. -UACR -CMP to check kidney function.  Per patient request, we will make it a CMP to also assess liver function -Foot exam completed today, sensation not intact and with scabbed over noninfected wounds of right foot, patient to see a podiatrist ASAP and continue to monitor for signs of infection

## 2023-03-21 ENCOUNTER — Other Ambulatory Visit: Payer: Self-pay | Admitting: Student

## 2023-03-21 ENCOUNTER — Encounter: Payer: Medicaid Other | Attending: Physical Medicine & Rehabilitation | Admitting: Registered Nurse

## 2023-03-21 ENCOUNTER — Encounter: Payer: Self-pay | Admitting: Registered Nurse

## 2023-03-21 VITALS — BP 111/73 | HR 95 | Ht 72.0 in | Wt 259.2 lb

## 2023-03-21 DIAGNOSIS — G894 Chronic pain syndrome: Secondary | ICD-10-CM

## 2023-03-21 DIAGNOSIS — Z794 Long term (current) use of insulin: Secondary | ICD-10-CM

## 2023-03-21 DIAGNOSIS — Z5181 Encounter for therapeutic drug level monitoring: Secondary | ICD-10-CM

## 2023-03-21 DIAGNOSIS — E1140 Type 2 diabetes mellitus with diabetic neuropathy, unspecified: Secondary | ICD-10-CM | POA: Diagnosis not present

## 2023-03-21 DIAGNOSIS — Z79891 Long term (current) use of opiate analgesic: Secondary | ICD-10-CM

## 2023-03-21 DIAGNOSIS — F341 Dysthymic disorder: Secondary | ICD-10-CM

## 2023-03-21 DIAGNOSIS — L659 Nonscarring hair loss, unspecified: Secondary | ICD-10-CM

## 2023-03-21 MED ORDER — MORPHINE SULFATE ER 30 MG PO TBCR: 30.0000 mg | EXTENDED_RELEASE_TABLET | Freq: Two times a day (BID) | ORAL | 0 refills | Status: DC

## 2023-03-21 MED ORDER — MORPHINE SULFATE 30 MG PO TABS: ORAL_TABLET | ORAL | 0 refills | Status: DC

## 2023-03-21 NOTE — Progress Notes (Signed)
Subjective:    Patient ID: [name], female    DOB: July 06, 1970, 52 y.o.   MRN: [id number]  HPI: [name] is a 52 y.o. female who returns for follow up appointment for chronic pain and medication refill. She states her pain is located in  her bilateral hands and bilateral feet with tingling and burning. She also reports lower back pain. She rates her pain 8. Her current exercise regime is walking and performing stretching exercises.  [name] Morphine equivalent is 120.00 MME.She  is also prescribed Clonazepam .We have discussed the black box warning of using opioids and benzodiazepines. I highlighted the dangers of using these drugs together and discussed the adverse events including respiratory suppression, overdose, cognitive impairment and importance of compliance with current regimen. We will continue to monitor and adjust as indicated.    Last UDS was Performed on 01/21/2023, it was consistent.        Pain Inventory Average Pain 7 Pain Right Now 8 My pain is constant, sharp, burning, dull, stabbing, tingling, and aching  In the last 24 hours, has pain interfered with the following? General activity 9 Relation with others 9 Enjoyment of life 9 What TIME of day is your pain at its worst? varies Sleep (in general) Poor  Pain is worse with: walking, bending, and standing Pain improves with: rest and medication Relief from Meds:  not specified  Family History  Problem Relation Age of Onset   Stroke Mother    Fibromyalgia Mother    Aortic aneurysm Mother    Stroke Father    Colon polyps Brother    Stroke Maternal Grandmother    Colon cancer Maternal Grandmother    Heart Problems Paternal Grandmother    Dementia Paternal Grandmother    Heart attack Paternal Grandfather    Esophageal cancer Neg Hx    Rectal cancer Neg Hx    Stomach cancer Neg Hx    Social History   Socioeconomic History   Marital status: Legally Separated    Spouse name: Not  on file   Number of children: 1   Years of education: Not on file   Highest education level: Bachelor's degree (e.g., BA, AB, BS)  Occupational History   Occupation: Disabled  Tobacco Use   Smoking status: Never   Smokeless tobacco: Never  Vaping Use   Vaping status: Never Used  Substance and Sexual Activity   Alcohol use: No    Alcohol/week: 0.0 standard drinks of alcohol   Drug use: No    Comment: rx opiods   Sexual activity: Yes    Birth control/protection: Condom  Other Topics Concern   Not on file  Social History Narrative   Not on file   Social Drivers of Health   Financial Resource Strain: High Risk (03/14/2023)   Overall Financial Resource Strain (CARDIA)    Difficulty of Paying Living Expenses: Very hard  Food Insecurity: Food Insecurity Present (03/14/2023)   Hunger Vital Sign    Worried About Running Out of Food in the Last Year: Often true    Ran Out of Food in the Last Year: Often true  Transportation Needs: No Transportation Needs (03/14/2023)   PRAPARE - Administrator, Civil Service (Medical): No    Lack of Transportation (Non-Medical): No  Physical Activity: Inactive (03/14/2023)   Exercise Vital Sign    Days of Exercise per Week: 1 day    Minutes of Exercise per Session: 0 min  Stress: Stress  Concern Present (03/14/2023)   [employer] of Occupational Health - Occupational Stress Questionnaire    Feeling of Stress : Very much  Social Connections: Socially Isolated (03/14/2023)   Social Connection and Isolation Panel [NHANES]    Frequency of Communication with Friends and Family: Once a week    Frequency of Social Gatherings with Friends and Family: Once a week    Attends Religious Services: Never    Database administrator or Organizations: No    Attends Engineer, structural: Not on file    Marital Status: Separated   Past Surgical History:  Procedure Laterality Date   ABDOMINOPLASTY     CHOLECYSTECTOMY     Past  Surgical History:  Procedure Laterality Date   ABDOMINOPLASTY     CHOLECYSTECTOMY     Past Medical History:  Diagnosis Date   Anxiety    Cataract    DEPRESSION/ANXIETY 08/08/2009   Diabetes mellitus without complication (HCC)    Hyperlipidemia    IBS 02/02/2010   Neuropathy    Polyneuropathy 12/04/2010   Renal stones    Sleep apnea 05/2021   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) 08/08/2009   BP 111/73   Pulse 95   Ht 6' (1.829 m)   Wt 259 lb 3.2 oz (117.6 kg)   LMP 05/06/2018   SpO2 99%   BMI 35.15 kg/m   Opioid Risk Score:   Fall Risk Score:  `1  Depression screen [facility] 2/9     03/21/2023    2:40 PM 03/15/2023    3:19 PM 01/21/2023    3:25 PM 11/21/2022    3:43 PM 08/21/2022    3:20 PM 07/26/2022    2:29 PM 06/12/2022    3:12 PM  Depression screen PHQ 2/9  Decreased Interest 0 2 0 0 0 1 0  Down, Depressed, Hopeless 0 2 0 0 0 1 0  PHQ - 2 Score 0 4 0 0 0 2 0  Altered sleeping  3       Tired, decreased energy  3       Change in appetite  0       Feeling bad or failure about yourself   2       Trouble concentrating  1       Moving slowly or fidgety/restless  0       Suicidal thoughts  0       PHQ-9 Score  13           Review of Systems  All other systems reviewed and are negative.      Objective:   Physical Exam Vitals and nursing note reviewed.  Constitutional:      Appearance: Normal appearance.  Cardiovascular:     Rate and Rhythm: Normal rate and regular rhythm.     Pulses: Normal pulses.     Heart sounds: Normal heart sounds.  Pulmonary:     Effort: Pulmonary effort is normal.     Breath sounds: Normal breath sounds.  Musculoskeletal:     Comments: Normal Muscle Bulk and Muscle Testing Reveals:  Upper Extremities: Full ROM and Muscle Strength 5/5  Lower Extremities: Full ROM and Muscle Strength 5/5 Arises from Table slowly Narrow Based  Gait     Skin:    General:  Skin is warm and dry.  Neurological:     Mental Status: She is alert and oriented to person, place, and time.  Psychiatric:  Mood and Affect: Mood normal.        Behavior: Behavior normal.         Assessment & Plan:  1.Severe peripheral polyneuropathy of undetermined etiology.: 03/20/2024 Refilled: MS Contin 30 mg one tablet every 12 hours #60 and  MSIR 30 mg one tablet at HS, may take a tablet during the day as needed #60.We will continue the opioid monitoring program, this consists of regular clinic visits, examinations, urine drug screen, pill counts as well as use of [place] Controlled Substance Reporting system. A 12 month History has been reviewed on the [place] Controlled Substance Reporting System on 03/21/2023 2. Bilateral progressive upper extremity hand pain/ Polyneuropathy. Continue with current medication regime. Tight Glucose Control. Adhere to healthy diet regime. Continue with current medication regimen with nortriptyline: 03/21/2023 3. Muscle Spasms: Continue current medication regimen with  Zanaflex. Continue to Monitor.  03/20/2024 4. Depression/Anxiety: Continue current medication regimen with Paxil and  Klonopin. Continue to Monitor. 03/21/2023   F/U in 1 month

## 2023-03-21 NOTE — Progress Notes (Signed)
Pt request referral to her daughters dermatologist. Daughter has alopecia and pt is concerned because her hair has been thinning for past year or so. Anemia and thyroid disorders have been ruled out with labs. Referral sent.

## 2023-03-25 ENCOUNTER — Telehealth: Payer: Self-pay | Admitting: Pharmacist

## 2023-03-25 NOTE — Telephone Encounter (Signed)
Patient contacted the office and left voice mail - sharing concern related to differences with CGM vs. Finger stick glucose readings of ~ 100 points.  She expressed concern over the differences.  She requested follow-up.   Returned call 12/30 and shared thoughts via VM message.  Recent A1C suggests no need for change unless patient is experiencing low readings.   Review of her CGM shows 0% low readings.   TIR was 78 % with 21% being 180-250 - only 1% > 250 No therapy change suggested at this time.  Happy to see patient in follow-up.  Thanked her for her thoughts.   Total time with patient call and documentation of interaction: 9 minutes.

## 2023-03-26 NOTE — Telephone Encounter (Signed)
 PA submitted for morphine 30 mg Livvy Spilman (Key: [id number])  Pa submitted for morphine 30 msir Roma Kayser (Key: Alwyn Pea)

## 2023-03-28 ENCOUNTER — Other Ambulatory Visit: Payer: Self-pay | Admitting: Student

## 2023-03-28 DIAGNOSIS — E1159 Type 2 diabetes mellitus with other circulatory complications: Secondary | ICD-10-CM

## 2023-04-01 ENCOUNTER — Other Ambulatory Visit: Payer: Self-pay | Admitting: Student

## 2023-04-01 ENCOUNTER — Encounter: Payer: Self-pay | Admitting: Student

## 2023-04-01 DIAGNOSIS — E118 Type 2 diabetes mellitus with unspecified complications: Secondary | ICD-10-CM

## 2023-04-01 MED ORDER — BD PEN NEEDLE NANO 2ND GEN 32G X 4 MM MISC: 11 refills | Status: DC

## 2023-04-01 NOTE — Telephone Encounter (Signed)
 Reviewed and agree with Dr Macky Lower plan.

## 2023-04-03 ENCOUNTER — Encounter: Payer: Self-pay | Admitting: Student

## 2023-04-08 ENCOUNTER — Encounter: Payer: Self-pay | Admitting: Podiatry

## 2023-04-09 MED ORDER — BD PEN NEEDLE NANO 2ND GEN 32G X 4 MM MISC: 11 refills | Status: AC

## 2023-04-12 ENCOUNTER — Ambulatory Visit (INDEPENDENT_AMBULATORY_CARE_PROVIDER_SITE_OTHER): Payer: Medicaid Other | Admitting: Gastroenterology

## 2023-04-12 ENCOUNTER — Encounter: Payer: Self-pay | Admitting: Gastroenterology

## 2023-04-12 VITALS — BP 114/68 | HR 94 | Ht 72.0 in | Wt 259.0 lb

## 2023-04-12 DIAGNOSIS — Z79891 Long term (current) use of opiate analgesic: Secondary | ICD-10-CM

## 2023-04-12 DIAGNOSIS — K5909 Other constipation: Secondary | ICD-10-CM

## 2023-04-12 DIAGNOSIS — F1190 Opioid use, unspecified, uncomplicated: Secondary | ICD-10-CM

## 2023-04-12 DIAGNOSIS — Z1211 Encounter for screening for malignant neoplasm of colon: Secondary | ICD-10-CM

## 2023-04-12 MED ORDER — LINACLOTIDE 145 MCG PO CAPS: 145.0000 ug | ORAL_CAPSULE | Freq: Every day | ORAL | 0 refills | Status: DC

## 2023-04-12 MED ORDER — NA SULFATE-K SULFATE-MG SULF 17.5-3.13-1.6 GM/177ML PO SOLN: 1.0000 | Freq: Once | ORAL | 0 refills | Status: AC

## 2023-04-12 MED ORDER — SUFLAVE 178.7 G PO SOLR: 1.0000 | Freq: Once | ORAL | 0 refills | Status: DC

## 2023-04-12 MED ORDER — LINACLOTIDE 290 MCG PO CAPS: 290.0000 ug | ORAL_CAPSULE | Freq: Every day | ORAL | Status: DC

## 2023-04-12 NOTE — Progress Notes (Signed)
HPI :  53 year old female with a history of chronic constipation secondary to narcotics, diabetes, here to reestablish care and discuss constipation and colon cancer screening.  She was last seen in the office in July 2017.  We have scheduled her twice for colon cancer screening since that time, however she canceled both procedures just prior to the procedure being done.  She states she had a "panic attack" and was anxious about anesthesia and interacting with her medications.  She has been taking chronic narcotics for chronic neuropathic pain.  She takes long-acting morphine twice daily as well as immediate release morphine as needed.  She has been on this regimen for some time now.  She has baseline constipation that bothers her.  When I saw her years ago she was having loose stools but more so over the past several years she has had constipation.  She is having bowel movement upwards of once weekly at a time.  She has used over-the-counter regimen such as MiraLAX and other laxatives which have not provided a good long-term result or helps her too much.  She generally does not have any blood in the stool but has had occasional red blood on the toilet paper when wiping herself at times.  She does take Ozempic and insulin for her diabetes, she states her bowels seem to be stable despite that.  She has not tried any prescriptions for her constipation.  She had a colonoscopy remotely in 2001.  Her grandmother had colon cancer, and her brother had numerous polyps in the past.  She has not had a screening colonoscopy since she has been of age for that.  She denies any cardiopulmonary symptoms that bother her.  She denies any problems with anesthesia in the past.  She explains that due to her chronic narcotic she is very anxious about getting sedated for this.  We discussed options for her anesthesia as outlined below.  She is requesting the exam done without sedation, she thinks she would do better with  that    Calprotectin 2018 - 18 Negative celiac labs   Past Medical History:  Diagnosis Date   Anxiety    Cataract    DEPRESSION/ANXIETY 08/08/2009   Diabetes mellitus without complication (HCC)    Hyperlipidemia    IBS 02/02/2010   Neuropathy    Polyneuropathy 12/04/2010   Renal stones    Sleep apnea 05/2021   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) 08/08/2009     Past Surgical History:  Procedure Laterality Date   ABDOMINOPLASTY     CHOLECYSTECTOMY     Family History  Problem Relation Age of Onset   Stroke Mother    Fibromyalgia Mother    Aortic aneurysm Mother    Stroke Father    Colon polyps Brother    Stroke Maternal Grandmother    Colon cancer Maternal Grandmother    Heart Problems Paternal Grandmother    Dementia Paternal Grandmother    Heart attack Paternal Grandfather    Esophageal cancer Neg Hx    Rectal cancer Neg Hx    Stomach cancer Neg Hx    Social History   Tobacco Use   Smoking status: Never   Smokeless tobacco: Never  Vaping Use   Vaping status: Never Used  Substance Use Topics   Alcohol use: No    Alcohol/week: 0.0 standard drinks of alcohol   Drug use: No    Comment: rx opiods   Current  Outpatient Medications  Medication Sig Dispense Refill   Accu-Chek Softclix Lancets lancets Use as instructed 100 each 12   aspirin 81 MG tablet Take 81 mg by mouth daily.     atorvastatin (LIPITOR) 80 MG tablet TAKE 1 TABLET(80 MG) BY MOUTH DAILY 90 tablet 3   Blood Glucose Monitoring Suppl (ACCU-CHEK GUIDE ME) w/Device KIT Use to check blood sugar TID. 1 kit 0   clonazePAM (KLONOPIN) 0.5 MG tablet TAKE 3 TABLETS BY MOUTH AT BEDTIME AS NEEDED 90 tablet 3   Continuous Glucose Sensor (FREESTYLE LIBRE 3 PLUS SENSOR) MISC Change sensor every 15 days. 2 each 11   enalapril (VASOTEC) 10 MG tablet TAKE 1 TABLET BY MOUTH DAILY 90 tablet 0   fenofibrate (TRICOR) 145 MG tablet TAKE 1  TABLET(145 MG) BY MOUTH DAILY 90 tablet 2   glucose blood (ACCU-CHEK GUIDE) test strip Use as instructed 100 each 12   Insulin Pen Needle (BD PEN NEEDLE NANO 2ND GEN) 32G X 4 MM MISC USE TO INJECT INSULIN DAILY Strength:32G X 4 MM 100 each 11   Lancets Misc. (ACCU-CHEK SOFTCLIX LANCET DEV) KIT 1 applicator by Does not apply route in the morning, at noon, and at bedtime. 1 kit 1   LANTUS SOLOSTAR 100 UNIT/ML Solostar Pen INJECT 34 UNITS INTO SKIN DAILY 15 mL 3   metFORMIN (GLUCOPHAGE) 1000 MG tablet TAKE 1 TABLET(1000 MG) BY MOUTH TWICE DAILY WITH A MEAL 180 tablet 2   morphine (MS CONTIN) 30 MG 12 hr tablet Take 1 tablet (30 mg total) by mouth every 12 (twelve) hours. 60 tablet 0   morphine (MSIR) 30 MG tablet One tablet at HS, may take a tablet during the day. 60 tablet 0   Multiple Vitamin (MULTIVITAMIN) tablet Take 1 tablet by mouth daily.     mupirocin ointment (BACTROBAN) 2 % Apply 1 Application topically 2 (two) times daily. 30 g 2   nortriptyline (PAMELOR) 50 MG capsule Take 50 mg by mouth at bedtime.     PARoxetine (PAXIL) 40 MG tablet Take 1 tablet (40 mg total) by mouth every morning. 90 tablet 0   Semaglutide, 2 MG/DOSE, (OZEMPIC, 2 MG/DOSE,) 8 MG/3ML SOPN Inject 2 mg into the skin once a week. 3 mL 11   tiZANidine (ZANAFLEX) 2 MG tablet TAKE 1 TABLET(2 MG) BY MOUTH THREE TIMES DAILY 270 tablet 1   VITAMIN D, CHOLECALCIFEROL, PO Take 2,400 Units by mouth daily.     No current facility-administered medications for this visit.   Allergies  Allergen Reactions   Vancomycin Other (See Comments)    Red Man's syndrome     Review of Systems: All systems reviewed and negative except where noted in HPI.   Lab Results  Component Value Date   WBC 7.1 03/15/2023   HGB 13.6 03/15/2023   HCT 42.6 03/15/2023   MCV 85 03/15/2023   PLT 342 03/15/2023    Lab Results  Component Value Date   NA 141 03/15/2023   CL 103 03/15/2023   K 4.2 03/15/2023   CO2 23 03/15/2023   BUN 15  03/15/2023   CREATININE 0.68 03/15/2023   EGFR 103 03/15/2023   CALCIUM 9.6 03/15/2023   ALBUMIN 4.3 03/15/2023   GLUCOSE 162 (H) 03/15/2023    Lab Results  Component Value Date   ALT 26 03/15/2023   AST 17 03/15/2023   ALKPHOS 57 03/15/2023   BILITOT 0.3 03/15/2023     Physical Exam: BP 114/68 (BP Location: Left Arm, Patient Position:  Sitting, Cuff Size: Normal)   Pulse 94   Ht 6' (1.829 m)   Wt 259 lb (117.5 kg)   LMP 05/06/2018   SpO2 100%   BMI 35.13 kg/m  Constitutional: Pleasant,well-developed, female in no acute distress. HEENT: Normocephalic and atraumatic. Conjunctivae are normal. No scleral icterus. Neck supple.  Cardiovascular: Normal rate, regular rhythm.  Pulmonary/chest: Effort normal and breath sounds normal.  Abdominal: Soft, nondistended, nontender.  There are no masses palpable. No hepatomegaly. Extremities: no edema Lymphadenopathy: No cervical adenopathy noted. Neurological: Alert and oriented to person place and time. Skin: Skin is warm and dry. No rashes noted. Psychiatric: Normal mood and affect. Behavior is normal.   ASSESSMENT: 53 y.o. female here for assessment of the following  1. Chronic constipation   2. Chronic narcotic use   3. Colon cancer screening    Chronic constipation in the setting of chronic narcotic use.  Currently using over-the-counter remedies that are not working too well for her.  Discussed options with her.  Have some free samples of Linzess we can give her today, gave her both samples of 145 mcg/day and 290 mcg/day.  She will try these over the next 2 weeks and see how she does.  If she finds this works well for her we can get her a prescription.  If it does not work well for her, she will let us know we can try some other options.  She has required narcotics for her chronic pain and suspect this constipation will be an ongoing issue for her requiring long-term therapy.  She is otherwise due for colon cancer screening.   She has booked this twice already and in fact prepped once for it but then canceled as she was rather anxious about anesthesia and wanted to discuss her options.  We discussed moderate sedation versus propofol versus doing an exam unsedated.  Given her chronic narcotic use I would not recommend moderate sedation.  I do think she would be fine with propofol I discussed the safety of this.  That being said, she remains anxious about having anesthesia and asks to do the exam without anesthesia.  Discussed what this would entail, risks the procedure.  Following discussion she wants to proceed with an unsedated colonoscopy.  I asked her if we were in the middle of the procedure and she could not tolerate it if she wanted anesthesia.  She said she probably would.  She will need to have a ride with her in case she needs some amount of sedation to get her through the exam.  In this light we will also need her to hold Ozempic for 1 week prior to the exam.  Discussed bowel preparation options with her.  While we are starting Linzess to help with her bowel regimen I think double preparation is in her best interest to ensure we only have to do this exam once for her and ensure adequate prep.  She agrees   PLAN: - schedule unsedated colonoscopy @ the [facility] double prep - hold Ozempic for one week prior to the exam - Linzess samples - start with / day and if does not work can go to 290 / mcg - call if script is needed. If it doesn't work let me know and we will try something else  Harlin Rain, MD [facility]  CC: Erick Alley, DO

## 2023-04-12 NOTE — Patient Instructions (Addendum)
We have giving you samples of the following medication to take:  Linzess 145 mcg and 290 mcg capsules.  Start with 145 capsules and if that is not effective, you can take 290's. Please let us know which is most effective and we will send you a prescription.  You have been scheduled for a colonoscopy. Please follow written instructions given to you at your visit today.   Please pick up your prep supplies at the pharmacy within the next 1-3 days.  If you use inhalers (even only as needed), please bring them with you on the day of your procedure.  DO NOT TAKE 7 DAYS PRIOR TO TEST- Trulicity (dulaglutide) Ozempic, Wegovy (semaglutide) Mounjaro (tirzepatide) Bydureon Bcise (exanatide extended release)  DO NOT TAKE 1 DAY PRIOR TO YOUR TEST Rybelsus (semaglutide) Adlyxin (lixisenatide) Victoza (liraglutide) Byetta (exanatide) ___________________________________________________________________________  Thank you for entrusting me with your care and for choosing [facility], Dr. Ileene Patrick

## 2023-04-17 DIAGNOSIS — G4733 Obstructive sleep apnea (adult) (pediatric): Secondary | ICD-10-CM | POA: Diagnosis not present

## 2023-04-22 ENCOUNTER — Telehealth: Payer: Self-pay

## 2023-04-22 NOTE — Telephone Encounter (Signed)
Pharmacy Patient Advocate Encounter   Received notification from CoverMyMeds that prior authorization for Freestyle Libre 3 Plus Sensors is required/requested.   Insurance verification completed.   The patient is insured through [facility] MEDICAID .   PA required; PA submitted to above mentioned insurance via CoverMyMeds Key/confirmation #/EOC [id number]. Status is pending

## 2023-04-23 NOTE — Telephone Encounter (Signed)
Pharmacy Patient Advocate Encounter  Received notification from [facility] MEDICAID that Prior Authorization for freestyle libre 3 plus sensor has been APPROVED from 04/22/23 to 04/21/24.   PA #/Case ID/Reference #:  [id number]

## 2023-04-24 ENCOUNTER — Encounter: Payer: Medicaid Other | Attending: Physical Medicine & Rehabilitation | Admitting: Registered Nurse

## 2023-04-24 VITALS — BP 121/79 | HR 91 | Ht 72.0 in | Wt 259.0 lb

## 2023-04-24 DIAGNOSIS — E1140 Type 2 diabetes mellitus with diabetic neuropathy, unspecified: Secondary | ICD-10-CM | POA: Insufficient documentation

## 2023-04-24 DIAGNOSIS — Z794 Long term (current) use of insulin: Secondary | ICD-10-CM

## 2023-04-24 DIAGNOSIS — F341 Dysthymic disorder: Secondary | ICD-10-CM | POA: Diagnosis not present

## 2023-04-24 DIAGNOSIS — Z5181 Encounter for therapeutic drug level monitoring: Secondary | ICD-10-CM | POA: Diagnosis not present

## 2023-04-24 DIAGNOSIS — Z79891 Long term (current) use of opiate analgesic: Secondary | ICD-10-CM | POA: Diagnosis not present

## 2023-04-24 DIAGNOSIS — G894 Chronic pain syndrome: Secondary | ICD-10-CM | POA: Insufficient documentation

## 2023-04-24 MED ORDER — MORPHINE SULFATE 30 MG PO TABS: ORAL_TABLET | ORAL | 0 refills | Status: DC

## 2023-04-24 MED ORDER — MORPHINE SULFATE ER 30 MG PO TBCR: 30.0000 mg | EXTENDED_RELEASE_TABLET | Freq: Two times a day (BID) | ORAL | 0 refills | Status: DC

## 2023-04-24 MED ORDER — CLONAZEPAM 0.5 MG PO TABS: ORAL_TABLET | ORAL | 3 refills | Status: DC

## 2023-04-24 NOTE — Progress Notes (Signed)
= Subjective:    Patient ID: [name], female    DOB: 1970-10-11, 53 y.o.   MRN: [id number]  HPI: DARENDA Guerra is a 53 y.o. female who returns for follow up appointment for chronic pain and medication refill. She states her pain is located in her bilateral hands and bilateral feet with tingling and burning. She rates her pain 5. Her current exercise regime is walking , riding her stationary bicycle for 15- 20 minutes two days a week and performing stretching exercises.  [name] Morphine equivalent is 120.00 MME.She is also prescribed Clonazepam .We have discussed the black box warning of using opioids and benzodiazepines. I highlighted the dangers of using these drugs together and discussed the adverse events including respiratory suppression, overdose, cognitive impairment and importance of compliance with current regimen. We will continue to monitor and adjust as indicated.     Last UDS was Performed on 01/21/2023, it was consistent.    Pain Inventory Average Pain 7 Pain Right Now 5 My pain is sharp, burning, dull, stabbing, tingling, and aching  In the last 24 hours, has pain interfered with the following? General activity 9 Relation with others 9 Enjoyment of life 9 What TIME of day is your pain at its worst? evening Sleep (in general) Poor  Pain is worse with: walking, bending, and some activites Pain improves with: rest and medication Relief from Meds: 7  Family History  Problem Relation Age of Onset  . Stroke Mother   . Fibromyalgia Mother   . Aortic aneurysm Mother   . Stroke Father   . Colon polyps Brother   . Stroke Maternal Grandmother   . Colon cancer Maternal Grandmother   . Heart Problems Paternal Grandmother   . Dementia Paternal Grandmother   . Heart attack Paternal Grandfather   . Esophageal cancer Neg Hx   . Rectal cancer Neg Hx   . Stomach cancer Neg Hx    Social History   Socioeconomic History  . Marital status: Legally  Separated    Spouse name: Not on file  . Number of children: 1  . Years of education: Not on file  . Highest education level: Bachelor's degree (e.g., BA, AB, BS)  Occupational History  . Occupation: Disabled  Tobacco Use  . Smoking status: Never  . Smokeless tobacco: Never  Vaping Use  . Vaping status: Never Used  Substance and Sexual Activity  . Alcohol use: No    Alcohol/week: 0.0 standard drinks of alcohol  . Drug use: No    Comment: rx opiods  . Sexual activity: Yes    Birth control/protection: Condom  Other Topics Concern  . Not on file  Social History Narrative  . Not on file   Social Drivers of Health   Financial Resource Strain: High Risk (03/14/2023)   Overall Financial Resource Strain (CARDIA)   . Difficulty of Paying Living Expenses: Very hard  Food Insecurity: Food Insecurity Present (03/14/2023)   Hunger Vital Sign   . Worried About Programme researcher, broadcasting/film/video in the Last Year: Often true   . Ran Out of Food in the Last Year: Often true  Transportation Needs: No Transportation Needs (03/14/2023)   PRAPARE - Transportation   . Lack of Transportation (Medical): No   . Lack of Transportation (Non-Medical): No  Physical Activity: Inactive (03/14/2023)   Exercise Vital Sign   . Days of Exercise per Week: 1 day   . Minutes of Exercise per Session: 0 min  Stress: Stress  Concern Present (03/14/2023)   [employer] of Occupational Health - Occupational Stress Questionnaire   . Feeling of Stress : Very much  Social Connections: Socially Isolated (03/14/2023)   Social Connection and Isolation Panel [NHANES]   . Frequency of Communication with Friends and Family: Once a week   . Frequency of Social Gatherings with Friends and Family: Once a week   . Attends Religious Services: Never   . Active Member of Clubs or Organizations: No   . Attends Banker Meetings: Not on file   . Marital Status: Separated   Past Surgical History:  Procedure Laterality Date   . ABDOMINOPLASTY    . CHOLECYSTECTOMY     Past Surgical History:  Procedure Laterality Date  . ABDOMINOPLASTY    . CHOLECYSTECTOMY     Past Medical History:  Diagnosis Date  . Anxiety   . Cataract   . DEPRESSION/ANXIETY 08/08/2009  . Diabetes mellitus without complication (HCC)   . Hyperlipidemia   . IBS 02/02/2010  . Neuropathy   . Polyneuropathy 12/04/2010  . Renal stones   . Sleep apnea 05/2021  . 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) 08/08/2009   Ht 6' (1.829 m)   Wt 259 lb (117.5 kg)   LMP 05/06/2018   BMI 35.13 kg/m   Opioid Risk Score:   Fall Risk Score:  `1  Depression screen [facility] 2/9     03/21/2023    2:40 PM 03/15/2023    3:19 PM 01/21/2023    3:25 PM 11/21/2022    3:43 PM 08/21/2022    3:20 PM 07/26/2022    2:29 PM 06/12/2022    3:12 PM  Depression screen PHQ 2/9  Decreased Interest 0 2 0 0 0 1 0  Down, Depressed, Hopeless 0 2 0 0 0 1 0  PHQ - 2 Score 0 4 0 0 0 2 0  Altered sleeping  3       Tired, decreased energy  3       Change in appetite  0       Feeling bad or failure about yourself   2       Trouble concentrating  1       Moving slowly or fidgety/restless  0       Suicidal thoughts  0       PHQ-9 Score  13           Review of Systems  Musculoskeletal:  Positive for back pain and gait problem.  All other systems reviewed and are negative.     Objective:   Physical Exam Vitals and nursing note reviewed.  Constitutional:      Appearance: Normal appearance. She is obese.  Cardiovascular:     Rate and Rhythm: Normal rate and regular rhythm.     Pulses: Normal pulses.     Heart sounds: Normal heart sounds.  Pulmonary:     Effort: Pulmonary effort is normal.     Breath sounds: Normal breath sounds.  Musculoskeletal:     Comments: Normal Muscle Bulk and Muscle Testing Reveals:  Upper Extremities: Full ROM and Muscle Strength  5/5 Lower Extremities: Full ROM  and Muscle Strength 5/5 Arises from Chair slowly  Narrow Based  Gait     Skin:    General: Skin is warm and dry.  Neurological:     Mental Status: She is alert and oriented to person, place, and time.  Psychiatric:  Mood and Affect: Mood normal.        Behavior: Behavior normal.         Assessment & Plan:  1.Severe peripheral polyneuropathy of undetermined etiology.: 04/24/2023 Refilled: MS Contin 30 mg one tablet every 12 hours #60 and  MSIR 30 mg one tablet at HS, may take a tablet during the day as needed #60.We will continue the opioid monitoring program, this consists of regular clinic visits, examinations, urine drug screen, pill counts as well as use of [place] Controlled Substance Reporting system. A 12 month History has been reviewed on the [place] Controlled Substance Reporting System on 04/24/2023 2. Bilateral progressive upper extremity hand pain/ Polyneuropathy. Continue with current medication regime. Tight Glucose Control. Adhere to healthy diet regime. Continue with current medication regimen with nortriptyline: 04/24/2023 3. Muscle Spasms: Continue current medication regimen with  Zanaflex. Continue to Monitor.  04/24/2023 4. Depression/Anxiety: Continue current medication regimen with Paxil and  Klonopin. Continue to Monitor. 04/24/2023   F/U in 1 month

## 2023-04-26 ENCOUNTER — Encounter: Payer: Self-pay | Admitting: Podiatry

## 2023-04-29 ENCOUNTER — Other Ambulatory Visit: Payer: Self-pay | Admitting: Podiatry

## 2023-04-29 ENCOUNTER — Encounter: Payer: Self-pay | Admitting: Gastroenterology

## 2023-04-29 DIAGNOSIS — L97521 Non-pressure chronic ulcer of other part of left foot limited to breakdown of skin: Secondary | ICD-10-CM

## 2023-04-30 ENCOUNTER — Telehealth: Payer: Self-pay

## 2023-04-30 MED ORDER — LINACLOTIDE 290 MCG PO CAPS: ORAL_CAPSULE | ORAL | 0 refills | Status: DC

## 2023-04-30 NOTE — Telephone Encounter (Signed)
PA submitted for Morphine Sulfate ER and IR

## 2023-05-01 ENCOUNTER — Ambulatory Visit: Payer: Medicaid Other | Admitting: Podiatry

## 2023-05-03 NOTE — Telephone Encounter (Signed)
 Patient sent message saying nevermind she got her medication.

## 2023-05-10 DIAGNOSIS — L97529 Non-pressure chronic ulcer of other part of left foot with unspecified severity: Secondary | ICD-10-CM | POA: Diagnosis not present

## 2023-05-10 DIAGNOSIS — E08621 Diabetes mellitus due to underlying condition with foot ulcer: Secondary | ICD-10-CM | POA: Diagnosis not present

## 2023-05-11 ENCOUNTER — Inpatient Hospital Stay (HOSPITAL_COMMUNITY)
Admission: EM | Admit: 2023-05-11 | Discharge: 2023-05-13 | DRG: 638 | Disposition: A | Discharge status: Home / Self Care | Payer: Medicaid Other | Source: Ambulatory Visit | Attending: Internal Medicine | Admitting: Internal Medicine

## 2023-05-11 ENCOUNTER — Emergency Department (HOSPITAL_COMMUNITY): Payer: Medicaid Other

## 2023-05-11 ENCOUNTER — Encounter (HOSPITAL_COMMUNITY): Payer: Self-pay | Admitting: Internal Medicine

## 2023-05-11 ENCOUNTER — Other Ambulatory Visit: Payer: Self-pay

## 2023-05-11 DIAGNOSIS — L03116 Cellulitis of left lower limb: Secondary | ICD-10-CM | POA: Diagnosis not present

## 2023-05-11 DIAGNOSIS — E1140 Type 2 diabetes mellitus with diabetic neuropathy, unspecified: Secondary | ICD-10-CM | POA: Diagnosis present

## 2023-05-11 DIAGNOSIS — E11621 Type 2 diabetes mellitus with foot ulcer: Secondary | ICD-10-CM | POA: Diagnosis present

## 2023-05-11 DIAGNOSIS — Z794 Long term (current) use of insulin: Secondary | ICD-10-CM

## 2023-05-11 DIAGNOSIS — F419 Anxiety disorder, unspecified: Secondary | ICD-10-CM | POA: Diagnosis present

## 2023-05-11 DIAGNOSIS — G8929 Other chronic pain: Secondary | ICD-10-CM | POA: Diagnosis present

## 2023-05-11 DIAGNOSIS — Z87442 Personal history of urinary calculi: Secondary | ICD-10-CM

## 2023-05-11 DIAGNOSIS — E1142 Type 2 diabetes mellitus with diabetic polyneuropathy: Secondary | ICD-10-CM | POA: Diagnosis present

## 2023-05-11 DIAGNOSIS — Z800 Family history of malignant neoplasm of digestive organs: Secondary | ICD-10-CM

## 2023-05-11 DIAGNOSIS — I10 Essential (primary) hypertension: Secondary | ICD-10-CM | POA: Diagnosis present

## 2023-05-11 DIAGNOSIS — L97521 Non-pressure chronic ulcer of other part of left foot limited to breakdown of skin: Secondary | ICD-10-CM | POA: Diagnosis present

## 2023-05-11 DIAGNOSIS — E118 Type 2 diabetes mellitus with unspecified complications: Secondary | ICD-10-CM | POA: Diagnosis present

## 2023-05-11 DIAGNOSIS — Z6834 Body mass index (BMI) 34.0-34.9, adult: Secondary | ICD-10-CM

## 2023-05-11 DIAGNOSIS — Z9049 Acquired absence of other specified parts of digestive tract: Secondary | ICD-10-CM

## 2023-05-11 DIAGNOSIS — E669 Obesity, unspecified: Secondary | ICD-10-CM | POA: Diagnosis present

## 2023-05-11 DIAGNOSIS — E785 Hyperlipidemia, unspecified: Secondary | ICD-10-CM | POA: Diagnosis present

## 2023-05-11 DIAGNOSIS — Z8249 Family history of ischemic heart disease and other diseases of the circulatory system: Secondary | ICD-10-CM

## 2023-05-11 DIAGNOSIS — Z7982 Long term (current) use of aspirin: Secondary | ICD-10-CM

## 2023-05-11 DIAGNOSIS — Z823 Family history of stroke: Secondary | ICD-10-CM

## 2023-05-11 DIAGNOSIS — E1165 Type 2 diabetes mellitus with hyperglycemia: Secondary | ICD-10-CM | POA: Diagnosis present

## 2023-05-11 DIAGNOSIS — F325 Major depressive disorder, single episode, in full remission: Secondary | ICD-10-CM | POA: Diagnosis present

## 2023-05-11 DIAGNOSIS — E11628 Type 2 diabetes mellitus with other skin complications: Principal | ICD-10-CM | POA: Diagnosis present

## 2023-05-11 DIAGNOSIS — Z23 Encounter for immunization: Secondary | ICD-10-CM

## 2023-05-11 DIAGNOSIS — Z79899 Other long term (current) drug therapy: Secondary | ICD-10-CM

## 2023-05-11 DIAGNOSIS — Z8631 Personal history of diabetic foot ulcer: Secondary | ICD-10-CM | POA: Diagnosis not present

## 2023-05-11 DIAGNOSIS — Z881 Allergy status to other antibiotic agents status: Secondary | ICD-10-CM

## 2023-05-11 DIAGNOSIS — Z79891 Long term (current) use of opiate analgesic: Secondary | ICD-10-CM

## 2023-05-11 DIAGNOSIS — Z7985 Long-term (current) use of injectable non-insulin antidiabetic drugs: Secondary | ICD-10-CM

## 2023-05-11 DIAGNOSIS — L0390 Cellulitis, unspecified: Secondary | ICD-10-CM | POA: Diagnosis present

## 2023-05-11 DIAGNOSIS — Z7984 Long term (current) use of oral hypoglycemic drugs: Secondary | ICD-10-CM

## 2023-05-11 LAB — CBC WITH DIFFERENTIAL/PLATELET
Abs Immature Granulocytes: 0.05 10*3/uL (ref 0.00–0.07)
Basophils Absolute: 0.1 10*3/uL (ref 0.0–0.1)
Basophils Relative: 1 %
Eosinophils Absolute: 0.2 10*3/uL (ref 0.0–0.5)
Eosinophils Relative: 2 %
HCT: 39.9 % (ref 36.0–46.0)
Hemoglobin: 12.7 g/dL (ref 12.0–15.0)
Immature Granulocytes: 1 %
Lymphocytes Relative: 32 %
Lymphs Abs: 3.1 10*3/uL (ref 0.7–4.0)
MCH: 27.4 pg (ref 26.0–34.0)
MCHC: 31.8 g/dL (ref 30.0–36.0)
MCV: 86 fL (ref 80.0–100.0)
Monocytes Absolute: 0.7 10*3/uL (ref 0.1–1.0)
Monocytes Relative: 7 %
Neutro Abs: 5.6 10*3/uL (ref 1.7–7.7)
Neutrophils Relative %: 57 %
Platelets: 288 10*3/uL (ref 150–400)
RBC: 4.64 MIL/uL (ref 3.87–5.11)
RDW: 14.6 % (ref 11.5–15.5)
WBC: 9.7 10*3/uL (ref 4.0–10.5)
nRBC: 0 % (ref 0.0–0.2)

## 2023-05-11 LAB — BASIC METABOLIC PANEL
Anion gap: 10 (ref 5–15)
BUN: 17 mg/dL (ref 6–20)
CO2: 25 mmol/L (ref 22–32)
Calcium: 9.6 mg/dL (ref 8.9–10.3)
Chloride: 103 mmol/L (ref 98–111)
Creatinine, Ser: 0.62 mg/dL (ref 0.44–1.00)
GFR, Estimated: >60 mL/min (ref >60)
Glucose, Bld: 203 mg/dL — ABNORMAL HIGH (ref 70–99)
Potassium: 3.7 mmol/L (ref 3.5–5.1)
Sodium: 138 mmol/L (ref 135–145)

## 2023-05-11 LAB — HEMOGLOBIN A1C
Hgb A1c MFr Bld: 6.7 % — ABNORMAL HIGH (ref 4.8–5.6)
Mean Plasma Glucose: 145.59 mg/dL

## 2023-05-11 LAB — GLUCOSE, CAPILLARY
Glucose-Capillary: 152 mg/dL — ABNORMAL HIGH (ref 70–99)
Glucose-Capillary: 112 mg/dL — ABNORMAL HIGH (ref 70–99)
Glucose-Capillary: 195 mg/dL — ABNORMAL HIGH (ref 70–99)

## 2023-05-11 LAB — HIV ANTIBODY (ROUTINE TESTING W REFLEX): HIV Screen 4th Generation wRfx: NONREACTIVE

## 2023-05-11 MED ORDER — METRONIDAZOLE 500 MG/100ML IV SOLN
500.0000 mg | Freq: Once | INTRAVENOUS | Status: AC
  Administered 2023-05-11: 500 mg via INTRAVENOUS
  Filled 2023-05-11: qty 100

## 2023-05-11 MED ORDER — FENOFIBRATE 160 MG PO TABS
160.0000 mg | ORAL_TABLET | Freq: Every day | ORAL | Status: DC
  Administered 2023-05-12 – 2023-05-13 (×2): 160 mg via ORAL
  Filled 2023-05-11 (×2): qty 1

## 2023-05-11 MED ORDER — ACETAMINOPHEN 325 MG PO TABS
650.0000 mg | ORAL_TABLET | Freq: Four times a day (QID) | ORAL | Status: DC | PRN
  Administered 2023-05-13: 650 mg via ORAL
  Filled 2023-05-11: qty 2

## 2023-05-11 MED ORDER — SODIUM CHLORIDE 0.9 % IV SOLN
2.0000 g | Freq: Once | INTRAVENOUS | Status: AC
  Administered 2023-05-11: 2 g via INTRAVENOUS
  Filled 2023-05-11: qty 20

## 2023-05-11 MED ORDER — MORPHINE SULFATE ER 15 MG PO TBCR
30.0000 mg | EXTENDED_RELEASE_TABLET | Freq: Two times a day (BID) | ORAL | Status: DC
  Administered 2023-05-11 – 2023-05-13 (×4): 30 mg via ORAL
  Filled 2023-05-11 (×4): qty 2

## 2023-05-11 MED ORDER — INFLUENZA VIRUS VACC SPLIT PF (FLUZONE) 0.5 ML IM SUSY
0.5000 mL | PREFILLED_SYRINGE | INTRAMUSCULAR | Status: AC
  Administered 2023-05-13: 0.5 mL via INTRAMUSCULAR
  Filled 2023-05-11: qty 0.5

## 2023-05-11 MED ORDER — TIZANIDINE HCL 4 MG PO TABS
2.0000 mg | ORAL_TABLET | Freq: Three times a day (TID) | ORAL | Status: DC
  Administered 2023-05-11 – 2023-05-13 (×6): 2 mg via ORAL
  Filled 2023-05-11 (×6): qty 1

## 2023-05-11 MED ORDER — ASPIRIN 81 MG PO TBEC
81.0000 mg | DELAYED_RELEASE_TABLET | Freq: Every day | ORAL | Status: DC
  Administered 2023-05-12 – 2023-05-13 (×2): 81 mg via ORAL
  Filled 2023-05-11 (×4): qty 1

## 2023-05-11 MED ORDER — NORTRIPTYLINE HCL 25 MG PO CAPS
50.0000 mg | ORAL_CAPSULE | Freq: Every day | ORAL | Status: DC
  Administered 2023-05-11 – 2023-05-12 (×2): 50 mg via ORAL
  Filled 2023-05-11 (×2): qty 2

## 2023-05-11 MED ORDER — CLONAZEPAM 0.5 MG PO TABS
1.0000 mg | ORAL_TABLET | Freq: Every day | ORAL | Status: DC
  Administered 2023-05-11 – 2023-05-12 (×2): 1 mg via ORAL
  Filled 2023-05-11 (×2): qty 2

## 2023-05-11 MED ORDER — PAROXETINE HCL 20 MG PO TABS
40.0000 mg | ORAL_TABLET | Freq: Every day | ORAL | Status: DC
  Administered 2023-05-12 – 2023-05-13 (×2): 40 mg via ORAL
  Filled 2023-05-11 (×3): qty 2

## 2023-05-11 MED ORDER — SENNOSIDES-DOCUSATE SODIUM 8.6-50 MG PO TABS
1.0000 | ORAL_TABLET | Freq: Two times a day (BID) | ORAL | Status: DC | PRN
  Administered 2023-05-12: 1 via ORAL
  Filled 2023-05-11: qty 1

## 2023-05-11 MED ORDER — ENALAPRIL MALEATE 10 MG PO TABS
10.0000 mg | ORAL_TABLET | Freq: Every day | ORAL | Status: DC
  Administered 2023-05-12 – 2023-05-13 (×2): 10 mg via ORAL
  Filled 2023-05-11 (×3): qty 1

## 2023-05-11 MED ORDER — INSULIN ASPART 100 UNIT/ML IJ SOLN
0.0000 [IU] | Freq: Three times a day (TID) | INTRAMUSCULAR | Status: DC
Start: 2023-05-11 — End: 2023-05-13
  Administered 2023-05-11: 3 [IU] via SUBCUTANEOUS
  Administered 2023-05-12: 2 [IU] via SUBCUTANEOUS
  Administered 2023-05-12: 3 [IU] via SUBCUTANEOUS
  Filled 2023-05-11: qty 0.15

## 2023-05-11 MED ORDER — INSULIN GLARGINE-YFGN 100 UNIT/ML ~~LOC~~ SOLN
25.0000 [IU] | Freq: Every day | SUBCUTANEOUS | Status: DC
  Administered 2023-05-11 – 2023-05-13 (×3): 25 [IU] via SUBCUTANEOUS
  Filled 2023-05-11 (×3): qty 0.25

## 2023-05-11 MED ORDER — OXYCODONE HCL 5 MG PO TABS: 10.0000 mg | ORAL_TABLET | ORAL | Status: DC | PRN

## 2023-05-11 MED ORDER — ONDANSETRON HCL 4 MG/2ML IJ SOLN: 4.0000 mg | Freq: Four times a day (QID) | INTRAMUSCULAR | Status: DC | PRN

## 2023-05-11 MED ORDER — ACETAMINOPHEN 650 MG RE SUPP: 650.0000 mg | Freq: Four times a day (QID) | RECTAL | Status: DC | PRN

## 2023-05-11 MED ORDER — ENOXAPARIN SODIUM 60 MG/0.6ML IJ SOSY
60.0000 mg | PREFILLED_SYRINGE | Freq: Every day | INTRAMUSCULAR | Status: DC
  Administered 2023-05-11 – 2023-05-13 (×3): 60 mg via SUBCUTANEOUS
  Filled 2023-05-11 (×3): qty 0.6

## 2023-05-11 MED ORDER — INSULIN ASPART 100 UNIT/ML IJ SOLN
0.0000 [IU] | Freq: Every day | INTRAMUSCULAR | Status: DC
  Filled 2023-05-11: qty 0.05

## 2023-05-11 MED ORDER — ALBUTEROL SULFATE (2.5 MG/3ML) 0.083% IN NEBU: 2.5000 mg | INHALATION_SOLUTION | RESPIRATORY_TRACT | Status: DC | PRN

## 2023-05-11 MED ORDER — DOXYCYCLINE HYCLATE 100 MG PO TABS
100.0000 mg | ORAL_TABLET | Freq: Once | ORAL | Status: AC
  Administered 2023-05-11: 100 mg via ORAL
  Filled 2023-05-11: qty 1

## 2023-05-11 MED ORDER — ATORVASTATIN CALCIUM 40 MG PO TABS
80.0000 mg | ORAL_TABLET | Freq: Every day | ORAL | Status: DC
  Administered 2023-05-12 – 2023-05-13 (×2): 80 mg via ORAL
  Filled 2023-05-11 (×2): qty 4

## 2023-05-11 MED ORDER — ONDANSETRON HCL 4 MG PO TABS: 4.0000 mg | ORAL_TABLET | Freq: Four times a day (QID) | ORAL | Status: DC | PRN

## 2023-05-11 MED ORDER — PNEUMOCOCCAL 20-VAL CONJ VACC 0.5 ML IM SUSY
0.5000 mL | PREFILLED_SYRINGE | INTRAMUSCULAR | Status: AC
  Administered 2023-05-13: 0.5 mL via INTRAMUSCULAR
  Filled 2023-05-11: qty 0.5

## 2023-05-11 MED ORDER — SULFAMETHOXAZOLE-TRIMETHOPRIM 800-160 MG PO TABS
1.0000 | ORAL_TABLET | Freq: Two times a day (BID) | ORAL | Status: DC
Start: 2023-05-11 — End: 2023-05-12
  Administered 2023-05-11: 1 via ORAL
  Filled 2023-05-11 (×2): qty 1

## 2023-05-11 NOTE — ED Triage Notes (Signed)
 Pt came in for a diabetic wound on the left "big toe". Pt went to urgent care and they told her to go to the emergency department for possible IV antibiotics. Pt stated it is draining and she's had the same sock on for about 3 days.

## 2023-05-11 NOTE — H&P (Signed)
 History and Physical  [name]:[id number] DOB: 1970/08/21 DOA: 05/11/2023  PCP: Erick Alley, DO   Chief Complaint: Left great toe infection  HPI: [name] is a 53 y.o. female with medical history significant for insulin-dependent type 2 diabetes, peripheral neuropathy, polyneuropathy on narcotics being admitted to the hospital with left great toe diabetic ulceration and infection.  She has noticed progressive redness and swelling around a wound to her left big toe for the last few days.  Does not recall having any injury.  However states that she has had issues like this in the past though never on her left foot, due to neuropathy.  Denies any systemic symptoms such as fevers, chills, vomiting.  Went to urgent care yesterday and was sent to the ER for evaluation due to the need for possible IV antibiotics.  Review of Systems: Please see HPI for pertinent positives and negatives. A complete 10 system review of systems are otherwise negative.  Past Medical History:  Diagnosis Date   Anxiety    Cataract    DEPRESSION/ANXIETY 08/08/2009   Diabetes mellitus without complication (HCC)    Hyperlipidemia    IBS 02/02/2010   Neuropathy    Polyneuropathy 12/04/2010   Renal stones    Sleep apnea 05/2021   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) 08/08/2009   Past Surgical History:  Procedure Laterality Date   ABDOMINOPLASTY     CHOLECYSTECTOMY     Social History:  reports that she has never smoked. She has never used smokeless tobacco. She reports that she does not drink alcohol and does not use drugs.  Allergies  Allergen Reactions   Vancomycin Other (See Comments)    Red Man's syndrome    Family History  Problem Relation Age of Onset   Stroke Mother    Fibromyalgia Mother    Aortic aneurysm Mother    Stroke Father    Colon polyps Brother    Stroke Maternal Grandmother    Colon  cancer Maternal Grandmother    Heart Problems Paternal Grandmother    Dementia Paternal Grandmother    Heart attack Paternal Grandfather    Esophageal cancer Neg Hx    Rectal cancer Neg Hx    Stomach cancer Neg Hx      Prior to Admission medications   Medication Sig Start Date End Date Taking? Authorizing Provider  Accu-Chek Softclix Lancets lancets Use as instructed 05/03/22   Doreene Eland, MD  aspirin 81 MG tablet Take 81 mg by mouth daily.    [provider]  atorvastatin (LIPITOR) 80 MG tablet TAKE 1 TABLET(80 MG) BY MOUTH DAILY 05/23/22   Erick Alley, DO  clonazePAM (KLONOPIN) 0.5 MG tablet TAKE 3 TABLETS BY MOUTH AT BEDTIME AS NEEDED 04/24/23   Jones Bales, NP  Continuous Glucose Sensor (FREESTYLE LIBRE 3 PLUS SENSOR) MISC Change sensor every 15 days. 01/29/23   McDiarmid, Leighton Roach, MD  enalapril (VASOTEC) 10 MG tablet TAKE 1 TABLET BY MOUTH DAILY 03/28/23   Levin Erp, MD  fenofibrate (TRICOR) 145 MG tablet TAKE 1 TABLET(145 MG) BY MOUTH DAILY 11/16/22   Erick Alley, DO  glucose blood (ACCU-CHEK GUIDE) test strip Use as instructed 05/03/22   Doreene Eland, MD  Insulin Pen Needle (BD PEN NEEDLE NANO 2ND GEN) 32G X 4 MM MISC USE TO INJECT INSULIN DAILY Strength:32G X 4 MM 04/09/23   Erick Alley, DO  Lancets Misc. (ACCU-CHEK SOFTCLIX LANCET DEV)  KIT 1 applicator by Does not apply route in the morning, at noon, and at bedtime. 05/12/19   Mullis, Kiersten P, DO  LANTUS SOLOSTAR 100 UNIT/ML Solostar Pen INJECT 34 UNITS INTO SKIN DAILY 02/05/23   Erick Alley, DO  linaclotide [facility]) 290 MCG CAPS capsule Take 1 capsule (290 mcg total) by mouth daily 30 minutes before your first meal 04/30/23   Armbruster, Willaim Rayas, MD  metFORMIN (GLUCOPHAGE) 1000 MG tablet TAKE 1 TABLET(1000 MG) BY MOUTH TWICE DAILY WITH A MEAL 05/28/22   Erick Alley, DO  morphine (MS CONTIN) 30 MG 12 hr tablet Take 1 tablet (30 mg total) by mouth every 12 (twelve) hours. 04/24/23   Jones Bales, NP   morphine (MSIR) 30 MG tablet One tablet at HS, may take a tablet during the day. 04/24/23   Jones Bales, NP  Multiple Vitamin (MULTIVITAMIN) tablet Take 1 tablet by mouth daily.    [provider]  mupirocin ointment (BACTROBAN) 2 % Apply 1 Application topically 2 (two) times daily. 06/04/22   Louann Sjogren, DPM  nortriptyline (PAMELOR) 50 MG capsule Take 50 mg by mouth at bedtime. 08/14/22   [provider]  PARoxetine (PAXIL) 40 MG tablet Take 1 tablet (40 mg total) by mouth every morning. 03/14/23   Erick Alley, DO  Semaglutide, 2 MG/DOSE, (OZEMPIC, 2 MG/DOSE,) 8 MG/3ML SOPN Inject 2 mg into the skin once a week. 07/31/22   McDiarmid, Leighton Roach, MD  tiZANidine (ZANAFLEX) 2 MG tablet TAKE 1 TABLET(2 MG) BY MOUTH THREE TIMES DAILY 12/05/22   Jacalyn Lefevre L, NP  VITAMIN D, CHOLECALCIFEROL, PO Take 2,400 Units by mouth daily.    [provider]    Physical Exam: BP 125/67   Pulse 90   Temp 98.3 F (36.8 C)   Resp 18   LMP 05/06/2018   SpO2 97%  General:  Alert, oriented, calm, in no acute distress, sitting up on a stretcher in the ER, looks comfortable and nontoxic. Cardiovascular: RRR, no murmurs or rubs, no pedal edema Respiratory: clear to auscultation bilaterally, no wheezes, no crackles  Abdomen: soft, nontender, nondistended, normal bowel tones heard  Skin: dry, she has erythema and superficial ulceration of the left great toe, with associated erythema and surrounding edema Musculoskeletal: no joint effusions, normal range of motion  Psychiatric: appropriate affect, normal speech  Neurologic: extraocular muscles intact, clear speech, moving all extremities with intact sensorium           Labs on Admission:  Basic Metabolic Panel: Recent Labs  Lab 05/11/23 0443  NA 138  K 3.7  CL 103  CO2 25  GLUCOSE 203*  BUN 17  CREATININE 0.62  CALCIUM 9.6   Liver Function Tests: No results for input(s): "AST", "ALT", "ALKPHOS", "BILITOT", "PROT",  "ALBUMIN" in the last 168 hours. No results for input(s): "LIPASE", "AMYLASE" in the last 168 hours. No results for input(s): "AMMONIA" in the last 168 hours. CBC: Recent Labs  Lab 05/11/23 0443  WBC 9.7  NEUTROABS 5.6  HGB 12.7  HCT 39.9  MCV 86.0  PLT 288   Cardiac Enzymes: No results for input(s): "CKTOTAL", "CKMB", "CKMBINDEX", "TROPONINI" in the last 168 hours. BNP (last 3 results) No results for input(s): "BNP" in the last 8760 hours.  ProBNP (last 3 results) No results for input(s): "PROBNP" in the last 8760 hours.  CBG: No results for input(s): "GLUCAP" in the last 168 hours.  Radiological Exams on Admission: DG Foot Complete Left Result Date: 05/11/2023  CLINICAL DATA:  53 year old female with history of diabetic wound on the left great toe. EXAM: LEFT FOOT - COMPLETE 3+ VIEW COMPARISON:  No priors. FINDINGS: There is no evidence of fracture or dislocation. There is no evidence of arthropathy or other focal bone abnormality. Soft tissues are unremarkable. IMPRESSION: Negative. Electronically Signed   By: Trudie Reed M.D.   On: 05/11/2023 05:46   Assessment/Plan [name] is a 53 y.o. female with medical history significant for insulin-dependent type 2 diabetes, peripheral neuropathy, polyneuropathy on narcotics being admitted to the hospital with left great toe diabetic ulceration and infection.   Left great toe diabetic foot wound and associated cellulitis-without evidence of sepsis, or underlying concern for abscess or osteomyelitis.  Given broad-spectrum empiric IV antibiotics in the emergency department. -Observation admission -Continue Bactrim DS for now, especially given vancomycin allergy and less concern for deeper infection -Wound care consult  Chronic pain from polyneuropathy-continue home MS Contin, as needed Zanaflex  Insulin-dependent type 2 diabetes-check hemoglobin A1c, continue basal bolus insulin with moderate dose sliding  scale  Depression-Paxil daily  Hypertension-Vasotec  Hyperlipidemia-continue home statin  DVT prophylaxis: Lovenox     Code Status: Full Code  Consults called: None  Admission status: Observation  Time spent: 48 minutes  Kura Bethards Sharlette Dense MD [facility] Pager [phone]  If 7PM-7AM, please contact night-coverage [email or web address] Password [facility]  05/11/2023, 9:45 AM

## 2023-05-11 NOTE — ED Notes (Signed)
 ED Provider at bedside.

## 2023-05-11 NOTE — ED Provider Notes (Signed)
 [place] [facility] AT [facility] Provider Note   CSN: [id number] Arrival date & time: 05/11/23  0230     History  Chief Complaint  Patient presents with   Diabetic Wound    [name] is a 53 y.o. female.  The history is provided by the patient.  Patient w/ history of chronic back pain, diabetes, diabetic neuropathy presents for wound to her left big toe. Patient reports over the past several days she has noticed redness, swelling from wound to the left big toe.  She does not recall any injury but she has neuropathy which limits her sensation No fevers or chills or vomiting.  No other acute complaints.  She went to an urgent care who sent her here for further evaluation    Past Medical History:  Diagnosis Date   Anxiety    Cataract    DEPRESSION/ANXIETY 08/08/2009   Diabetes mellitus without complication (HCC)    Hyperlipidemia    IBS 02/02/2010   Neuropathy    Polyneuropathy 12/04/2010   Renal stones    Sleep apnea 05/2021   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) 08/08/2009    Home Medications Prior to Admission medications   Medication Sig Start Date End Date Taking? Authorizing Provider  Accu-Chek Softclix Lancets lancets Use as instructed 05/03/22   Doreene Eland, MD  aspirin 81 MG tablet Take 81 mg by mouth daily.    [provider]  atorvastatin (LIPITOR) 80 MG tablet TAKE 1 TABLET(80 MG) BY MOUTH DAILY 05/23/22   Erick Alley, DO  Blood Glucose Monitoring Suppl (ACCU-CHEK GUIDE ME) w/Device KIT Use to check blood sugar TID. 05/03/22   Doreene Eland, MD  clonazePAM (KLONOPIN) 0.5 MG tablet TAKE 3 TABLETS BY MOUTH AT BEDTIME AS NEEDED 04/24/23   Jones Bales, NP  Continuous Glucose Sensor (FREESTYLE LIBRE 3 PLUS SENSOR) MISC Change sensor every 15 days. 01/29/23   McDiarmid, Leighton Roach, MD  enalapril (VASOTEC) 10 MG tablet TAKE 1 TABLET BY MOUTH  DAILY 03/28/23   Levin Erp, MD  fenofibrate (TRICOR) 145 MG tablet TAKE 1 TABLET(145 MG) BY MOUTH DAILY 11/16/22   Erick Alley, DO  glucose blood (ACCU-CHEK GUIDE) test strip Use as instructed 05/03/22   Janit Pagan T, MD  Insulin Pen Needle (BD PEN NEEDLE NANO 2ND GEN) 32G X 4 MM MISC USE TO INJECT INSULIN DAILY Strength:32G X 4 MM 04/09/23   Erick Alley, DO  Lancets Misc. (ACCU-CHEK SOFTCLIX LANCET DEV) KIT 1 applicator by Does not apply route in the morning, at noon, and at bedtime. 05/12/19   Mullis, Kiersten P, DO  LANTUS SOLOSTAR 100 UNIT/ML Solostar Pen INJECT 34 UNITS INTO SKIN DAILY 02/05/23   Erick Alley, DO  linaclotide [facility]) 290 MCG CAPS capsule Take 1 capsule (290 mcg total) by mouth daily 30 minutes before your first meal 04/30/23   Armbruster, Willaim Rayas, MD  metFORMIN (GLUCOPHAGE) 1000 MG tablet TAKE 1 TABLET(1000 MG) BY MOUTH TWICE DAILY WITH A MEAL 05/28/22   Erick Alley, DO  morphine (MS CONTIN) 30 MG 12 hr tablet Take 1 tablet (30 mg total) by mouth every 12 (twelve) hours. 04/24/23   Jones Bales, NP  morphine (MSIR) 30 MG tablet One tablet at HS, may take a tablet during the day. 04/24/23   Jones Bales, NP  Multiple Vitamin (MULTIVITAMIN) tablet Take 1 tablet by mouth daily.    [provider]  mupirocin ointment (BACTROBAN) 2 % Apply 1 Application topically 2 (two) times daily. 06/04/22   Louann Sjogren, DPM  nortriptyline (PAMELOR) 50 MG capsule Take 50 mg by mouth at bedtime. 08/14/22   [provider]  PARoxetine (PAXIL) 40 MG tablet Take 1 tablet (40 mg total) by mouth every morning. 03/14/23   Erick Alley, DO  Semaglutide, 2 MG/DOSE, (OZEMPIC, 2 MG/DOSE,) 8 MG/3ML SOPN Inject 2 mg into the skin once a week. 07/31/22   McDiarmid, Leighton Roach, MD  tiZANidine (ZANAFLEX) 2 MG tablet TAKE 1 TABLET(2 MG) BY MOUTH THREE TIMES DAILY 12/05/22   Jacalyn Lefevre L, NP  VITAMIN D, CHOLECALCIFEROL, PO Take 2,400 Units by mouth daily.    [provider]       Allergies    Vancomycin    Review of Systems   Review of Systems  Constitutional:  Negative for fever.  Cardiovascular:  Negative for chest pain.  Gastrointestinal:  Negative for abdominal pain and vomiting.  Skin:  Positive for wound.  Neurological:  Positive for numbness.       Chronic numbness    Physical Exam Updated Vital Signs BP 108/60   Pulse 93   Temp 98.3 F (36.8 C)   Resp 18   LMP 05/06/2018   SpO2 97%  Physical Exam CONSTITUTIONAL: Well developed/well nourished HEAD: Normocephalic/atraumatic EYES: EOMI LUNGS: no apparent distress NEURO: Pt is awake/alert/appropriate, moves all extremitiesx4.  No facial droop.   EXTREMITIES: pulses normal/equal, full ROM Distal pulses intact in both feet No crepitus Erythema and warmth noted to the left foot and to the left tibial surface. SKIN:see photo below PSYCH: no abnormalities of mood noted, alert and oriented to situation       ED Results / Procedures / Treatments   Labs (all labs ordered are listed, but only abnormal results are displayed) Labs Reviewed  BASIC METABOLIC PANEL - Abnormal; Notable for the following components:      Result Value   Glucose, Bld 203 (*)    All other components within normal limits  CBC WITH DIFFERENTIAL/PLATELET    EKG None  Radiology DG Foot Complete Left Result Date: 05/11/2023 CLINICAL DATA:  53 year old female with history of diabetic wound on the left great toe. EXAM: LEFT FOOT - COMPLETE 3+ VIEW COMPARISON:  No priors. FINDINGS: There is no evidence of fracture or dislocation. There is no evidence of arthropathy or other focal bone abnormality. Soft tissues are unremarkable. IMPRESSION: Negative. Electronically Signed   By: Trudie Reed M.D.   On: 05/11/2023 05:46    Procedures Procedures    Medications Ordered in ED Medications  cefTRIAXone (ROCEPHIN) 2 g in sodium chloride 0.9 % 100 mL IVPB (0 g Intravenous Stopped 05/11/23 0523)    And  metroNIDAZOLE  (FLAGYL) IVPB 500 mg (0 mg Intravenous Stopped 05/11/23 7846)  doxycycline (VIBRA-TABS) tablet 100 mg (100 mg Oral Given 05/11/23 0455)    ED Course/ Medical Decision Making/ A&P Clinical Course as of 05/11/23 0701  Sat May 11, 2023  0506 Patient with history of diabetes, neuropathy presents with obvious wound to her left foot that is now worsened and with cellulitis streaking up her leg.  Will obtain x-ray, labs but anticipate admission.  Followed ED lower extremity cellulitis guidelines recommended ceftriaxone, Flagyl.  Patient has vancomycin allergy.  Doxycycline was also given [DW]  0529 Glucose(!): 203 Mild hyperglycemia [DW]  0700 Discussed with Dr. Lazarus Salines for admission.  X-ray was reviewed, no signs of osteomyelitis.  No crepitus to  suggest necrotizing fasciitis.  Suspect soft tissue infection and given her history will likely need IV antibiotics [DW]    Clinical Course User Index [DW] Zadie Rhine, MD                                 Medical Decision Making Amount and/or Complexity of Data Reviewed Labs: ordered. Decision-making details documented in ED Course. Radiology: ordered.  Risk Prescription drug management. Decision regarding hospitalization.   This patient presents to the ED for concern of left foot pain and redness, this involves an extensive number of treatment options, and is a complaint that carries with it a high risk of complications and morbidity.  The differential diagnosis includes but is not limited to cellulitis, osteomyelitis, necrotizing fasciitis  Comorbidities that complicate the patient evaluation: Patient's presentation is complicated by their history of diabetes  Social Determinants of Health: Patient's  history of chronic pain   increases the complexity of managing their presentation  Additional history obtained: Records reviewed  outpatient records reviewed  Lab Tests: I Ordered, and personally interpreted labs.  The pertinent results  include: Hyperglycemia  Imaging Studies ordered: I ordered imaging studies including X-ray left foot   I independently visualized and interpreted imaging which showed no acute findings I agree with the radiologist interpretation   Medicines ordered and prescription drug management: I ordered medication including Rocephin and Flagyl for presumed cellulitis Reevaluation of the patient after these medicines showed that the patient    improved  Consultations Obtained: I requested consultation with the admitting physician [facility] , and discussed  findings as well as pertinent plan - they recommend: Admit  Reevaluation: After the interventions noted above, I reevaluated the patient and found that they have :improved  Complexity of problems addressed: Patient's presentation is most consistent with  acute presentation with potential threat to life or bodily function  Disposition: After consideration of the diagnostic results and the patient's response to treatment,  I feel that the patent would benefit from admission   .           Final Clinical Impression(s) / ED Diagnoses Final diagnoses:  Cellulitis of left foot    Rx / DC Orders ED Discharge Orders     None         Zadie Rhine, MD 05/11/23 [phone]

## 2023-05-12 ENCOUNTER — Inpatient Hospital Stay (HOSPITAL_COMMUNITY): Payer: Medicaid Other

## 2023-05-12 DIAGNOSIS — E669 Obesity, unspecified: Secondary | ICD-10-CM | POA: Diagnosis present

## 2023-05-12 DIAGNOSIS — Z79899 Other long term (current) drug therapy: Secondary | ICD-10-CM | POA: Diagnosis not present

## 2023-05-12 DIAGNOSIS — L03116 Cellulitis of left lower limb: Secondary | ICD-10-CM | POA: Diagnosis not present

## 2023-05-12 DIAGNOSIS — Z794 Long term (current) use of insulin: Secondary | ICD-10-CM | POA: Diagnosis not present

## 2023-05-12 DIAGNOSIS — I10 Essential (primary) hypertension: Secondary | ICD-10-CM | POA: Diagnosis present

## 2023-05-12 DIAGNOSIS — M79672 Pain in left foot: Secondary | ICD-10-CM | POA: Diagnosis present

## 2023-05-12 DIAGNOSIS — E11628 Type 2 diabetes mellitus with other skin complications: Secondary | ICD-10-CM | POA: Diagnosis present

## 2023-05-12 DIAGNOSIS — Z8249 Family history of ischemic heart disease and other diseases of the circulatory system: Secondary | ICD-10-CM | POA: Diagnosis not present

## 2023-05-12 DIAGNOSIS — Z6834 Body mass index (BMI) 34.0-34.9, adult: Secondary | ICD-10-CM | POA: Diagnosis not present

## 2023-05-12 DIAGNOSIS — E785 Hyperlipidemia, unspecified: Secondary | ICD-10-CM | POA: Diagnosis present

## 2023-05-12 DIAGNOSIS — E1165 Type 2 diabetes mellitus with hyperglycemia: Secondary | ICD-10-CM | POA: Diagnosis present

## 2023-05-12 DIAGNOSIS — E1142 Type 2 diabetes mellitus with diabetic polyneuropathy: Secondary | ICD-10-CM | POA: Diagnosis present

## 2023-05-12 DIAGNOSIS — F325 Major depressive disorder, single episode, in full remission: Secondary | ICD-10-CM | POA: Diagnosis not present

## 2023-05-12 DIAGNOSIS — Z87442 Personal history of urinary calculi: Secondary | ICD-10-CM | POA: Diagnosis not present

## 2023-05-12 DIAGNOSIS — Z7982 Long term (current) use of aspirin: Secondary | ICD-10-CM | POA: Diagnosis not present

## 2023-05-12 DIAGNOSIS — Z23 Encounter for immunization: Secondary | ICD-10-CM | POA: Diagnosis not present

## 2023-05-12 DIAGNOSIS — L97521 Non-pressure chronic ulcer of other part of left foot limited to breakdown of skin: Secondary | ICD-10-CM | POA: Diagnosis present

## 2023-05-12 DIAGNOSIS — Z881 Allergy status to other antibiotic agents status: Secondary | ICD-10-CM | POA: Diagnosis not present

## 2023-05-12 DIAGNOSIS — E11621 Type 2 diabetes mellitus with foot ulcer: Secondary | ICD-10-CM | POA: Diagnosis present

## 2023-05-12 DIAGNOSIS — E1140 Type 2 diabetes mellitus with diabetic neuropathy, unspecified: Secondary | ICD-10-CM | POA: Diagnosis not present

## 2023-05-12 DIAGNOSIS — F419 Anxiety disorder, unspecified: Secondary | ICD-10-CM | POA: Diagnosis present

## 2023-05-12 DIAGNOSIS — Z7984 Long term (current) use of oral hypoglycemic drugs: Secondary | ICD-10-CM | POA: Diagnosis not present

## 2023-05-12 DIAGNOSIS — Z7985 Long-term (current) use of injectable non-insulin antidiabetic drugs: Secondary | ICD-10-CM | POA: Diagnosis not present

## 2023-05-12 DIAGNOSIS — Z79891 Long term (current) use of opiate analgesic: Secondary | ICD-10-CM | POA: Diagnosis not present

## 2023-05-12 DIAGNOSIS — L089 Local infection of the skin and subcutaneous tissue, unspecified: Secondary | ICD-10-CM | POA: Diagnosis not present

## 2023-05-12 DIAGNOSIS — G8929 Other chronic pain: Secondary | ICD-10-CM | POA: Diagnosis present

## 2023-05-12 LAB — GLUCOSE, CAPILLARY
Glucose-Capillary: 99 mg/dL (ref 70–99)
Glucose-Capillary: 145 mg/dL — ABNORMAL HIGH (ref 70–99)
Glucose-Capillary: 180 mg/dL — ABNORMAL HIGH (ref 70–99)
Glucose-Capillary: 138 mg/dL — ABNORMAL HIGH (ref 70–99)

## 2023-05-12 LAB — CBC
HCT: 40.7 % (ref 36.0–46.0)
Hemoglobin: 12.6 g/dL (ref 12.0–15.0)
MCH: 27.1 pg (ref 26.0–34.0)
MCHC: 31 g/dL (ref 30.0–36.0)
MCV: 87.5 fL (ref 80.0–100.0)
Platelets: 289 10*3/uL (ref 150–400)
RBC: 4.65 MIL/uL (ref 3.87–5.11)
RDW: 14.6 % (ref 11.5–15.5)
WBC: 6.4 10*3/uL (ref 4.0–10.5)
nRBC: 0 % (ref 0.0–0.2)

## 2023-05-12 LAB — BASIC METABOLIC PANEL
Anion gap: 9 (ref 5–15)
BUN: 17 mg/dL (ref 6–20)
CO2: 25 mmol/L (ref 22–32)
Calcium: 9.6 mg/dL (ref 8.9–10.3)
Chloride: 104 mmol/L (ref 98–111)
Creatinine, Ser: 0.49 mg/dL (ref 0.44–1.00)
GFR, Estimated: >60 mL/min (ref >60)
Glucose, Bld: 119 mg/dL — ABNORMAL HIGH (ref 70–99)
Potassium: 4.1 mmol/L (ref 3.5–5.1)
Sodium: 138 mmol/L (ref 135–145)

## 2023-05-12 MED ORDER — SODIUM CHLORIDE 0.9 % IV SOLN
2.0000 g | Freq: Every day | INTRAVENOUS | Status: DC
  Administered 2023-05-12 – 2023-05-13 (×2): 2 g via INTRAVENOUS
  Filled 2023-05-12 (×2): qty 20

## 2023-05-12 MED ORDER — MORPHINE SULFATE 15 MG PO TABS
30.0000 mg | ORAL_TABLET | ORAL | Status: DC | PRN
  Administered 2023-05-12: 30 mg via ORAL
  Filled 2023-05-12: qty 2

## 2023-05-12 MED ORDER — LINEZOLID 600 MG/300ML IV SOLN
600.0000 mg | Freq: Two times a day (BID) | INTRAVENOUS | Status: DC
  Administered 2023-05-12 – 2023-05-13 (×3): 600 mg via INTRAVENOUS
  Filled 2023-05-12 (×3): qty 300

## 2023-05-12 MED ORDER — INSULIN ASPART 100 UNIT/ML IJ SOLN
4.0000 [IU] | Freq: Three times a day (TID) | INTRAMUSCULAR | Status: DC
Start: 2023-05-12 — End: 2023-05-13
  Administered 2023-05-13: 4 [IU] via SUBCUTANEOUS

## 2023-05-12 NOTE — Progress Notes (Signed)
 [facility] PROGRESS NOTE    Progress Note  [name]  JYN:[id number] DOB: Dec 27, 1970 DOA: 05/11/2023 PCP: Erick Alley, DO     Brief Narrative:   [name] is an 53 y.o. female past medical history significant for insulin-dependent diabetes mellitus type 2, peripheral neuropathy and polyneuropathy on narcotics admitted for left big toe ulcer with infection which has been progressing.   Assessment/Plan:   Diabetic foot left great toe ulcer/left lower extremity cellulitis: X-ray of the left foot showed no signs of osteomyelitis. He was started on Bactrim twice daily as per patient she has a vancomycin allergy. Wound care has been consulted. Check an MRI of the foot to rule out osteomyelitis  Type 2 diabetes mellitus with complication, without long-term current use of insulin (HCC) Uncontrolled diabetes mellitus type 2: Started on long-acting insulin plus sliding scale insulin. A1c 6.7.  Depression, major, in remission [facility]): Continue Paxil  Neuropathy due to type 2 diabetes mellitus (HCC) Resume home medications.  Obesity (BMI 30-39.9) Noted.  DVT prophylaxis: loveno Family Communication:none Status is: Observation The patient will require care spanning > 2 midnights and should be moved to inpatient because: Diabetic foot infection    Code Status:     Code Status Orders  (From admission, onward)           Start     Ordered   05/11/23 0933  Full code  Continuous       Question:  By:  Answer:  Consent: discussion documented in EHR   05/11/23 0933           Code Status History     Date Active Date Inactive Code Status Order ID Comments User Context   05/09/2018 0228 05/09/2018 1631 Full Code [id number]  Altamese Dilling, MD Inpatient         IV Access:   Peripheral IV   Procedures and diagnostic studies:   DG Foot Complete Left Result Date: 05/11/2023 CLINICAL DATA:  53 year old female with history of diabetic  wound on the left great toe. EXAM: LEFT FOOT - COMPLETE 3+ VIEW COMPARISON:  No priors. FINDINGS: There is no evidence of fracture or dislocation. There is no evidence of arthropathy or other focal bone abnormality. Soft tissues are unremarkable. IMPRESSION: Negative. Electronically Signed   By: Trudie Reed M.D.   On: 05/11/2023 05:46     Medical Consultants:   None.   Subjective:    [name] relates her pain is not controlled  Objective:    Vitals:   05/11/23 1737 05/11/23 2212 05/12/23 0206 05/12/23 0535  BP: (!) 92/52 (!) 111/55 122/78 (!) 104/53  Pulse: 78 87 87 78  Resp: 18 18 18 18   Temp: 98.1 F (36.7 C) 97.8 F (36.6 C) (!) 97.5 F (36.4 C) 97.7 F (36.5 C)  TempSrc: Oral Oral Oral Oral  SpO2: 98% 100% 100% 97%  Weight:      Height:       SpO2: 97 %   Intake/Output Summary (Last 24 hours) at 05/12/2023 0718 Last data filed at 05/12/2023 0535 Gross per 24 hour  Intake 1080 ml  Output 700 ml  Net 380 ml   Filed Weights   05/11/23 1300  Weight: 115.7 kg    Exam: General exam: In no acute distress. Respiratory system: Good air movement and clear to auscultation. Cardiovascular system: S1 & S2 heard, RRR. No JVD. Gastrointestinal system: Abdomen is nondistended, soft and nontender.  Extremities: No pedal edema. Skin: The left  great toe is significantly swollen and erythematous and with a medial ulcer Psychiatry: Judgement and insight appear normal. Mood & affect appropriate.    Data Reviewed:    Labs: Basic Metabolic Panel: Recent Labs  Lab 05/11/23 0443 05/12/23 0441  NA 138 138  K 3.7 4.1  CL 103 104  CO2 25 25  GLUCOSE 203* 119*  BUN 17 17  CREATININE 0.62 0.49  CALCIUM 9.6 9.6   GFR Estimated Creatinine Clearance: 113 mL/min (by C-G formula based on SCr of 0.49 mg/dL). Liver Function Tests: No results for input(s): "AST", "ALT", "ALKPHOS", "BILITOT", "PROT", "ALBUMIN" in the last 168 hours. No results for input(s):  "LIPASE", "AMYLASE" in the last 168 hours. No results for input(s): "AMMONIA" in the last 168 hours. Coagulation profile No results for input(s): "INR", "PROTIME" in the last 168 hours. COVID-19 Labs  No results for input(s): "DDIMER", "FERRITIN", "LDH", "CRP" in the last 72 hours.  No results found for: "SARSCOV2NAA"  CBC: Recent Labs  Lab 05/11/23 0443 05/12/23 0441  WBC 9.7 6.4  NEUTROABS 5.6  --   HGB 12.7 12.6  HCT 39.9 40.7  MCV 86.0 87.5  PLT 288 289   Cardiac Enzymes: No results for input(s): "CKTOTAL", "CKMB", "CKMBINDEX", "TROPONINI" in the last 168 hours. BNP (last 3 results) No results for input(s): "PROBNP" in the last 8760 hours. CBG: Recent Labs  Lab 05/11/23 1324 05/11/23 1732 05/11/23 2143  GLUCAP 152* 112* 195*   D-Dimer: No results for input(s): "DDIMER" in the last 72 hours. Hgb A1c: Recent Labs    05/11/23 1359  HGBA1C 6.7*   Lipid Profile: No results for input(s): "CHOL", "HDL", "LDLCALC", "TRIG", "CHOLHDL", "LDLDIRECT" in the last 72 hours. Thyroid function studies: No results for input(s): "TSH", "T4TOTAL", "T3FREE", "THYROIDAB" in the last 72 hours.  Invalid input(s): "FREET3" Anemia work up: No results for input(s): "VITAMINB12", "FOLATE", "FERRITIN", "TIBC", "IRON", "RETICCTPCT" in the last 72 hours. Sepsis Labs: Recent Labs  Lab 05/11/23 0443 05/12/23 0441  WBC 9.7 6.4   Microbiology No results found for this or any previous visit (from the past 240 hours).   Medications:    aspirin EC  81 mg Oral Daily   atorvastatin  80 mg Oral Daily   clonazePAM  1 mg Oral QHS   enalapril  10 mg Oral Daily   enoxaparin (LOVENOX) injection  60 mg Subcutaneous Daily   fenofibrate  160 mg Oral Daily   influenza vac split trivalent PF  0.5 mL Intramuscular Tomorrow-1000   insulin aspart  0-15 Units Subcutaneous TID WC   insulin aspart  0-5 Units Subcutaneous QHS   insulin glargine-yfgn  25 Units Subcutaneous Daily   morphine  30 mg  Oral Q12H   nortriptyline  50 mg Oral QHS   PARoxetine  40 mg Oral Daily   pneumococcal 20-valent conjugate vaccine  0.5 mL Intramuscular Tomorrow-1000   sulfamethoxazole-trimethoprim  1 tablet Oral Q12H   tiZANidine  2 mg Oral TID   Continuous Infusions:    LOS: 0 days   Marinda Elk  [facility]  05/12/2023, 7:18 AM

## 2023-05-12 NOTE — Progress Notes (Signed)
 Dr. David Stall aware CBG 99. Received order to hold meal coverage insulin.

## 2023-05-12 NOTE — Progress Notes (Signed)
 Wound care done to lt great toe per order. Redness and swelling noted yet slightly improved from time of admission. Scant amount of serosanguineous drain noted. Pt tolerated well. No pain due to Neuropathy.

## 2023-05-12 NOTE — Plan of Care (Signed)

## 2023-05-12 NOTE — Consult Note (Signed)
 WOC Nurse Consult Note: Reason for Consult: left great toe wound; seen in urgent care sent to ED for evaluation Xray negative, MRI pending  Wound type: Neuropathic foot wound; inner left great toe with partial thickness skin loss  Pressure Injury POA: NA Measurement: see nursing flow sheets Wound bed:100% pink, clean Drainage (amount, consistency, odor) none documented  Periwound: edema, erythema  Dressing procedure/placement/frequency: Cover open area with single layer of xeroform gauze, top with conform, change every other day  Consider consultation with podiatry while inpatient.      Re consult if needed, will not follow at this time. Thanks  Javier Gell [employer], RN,CWOCN, CNS, CWON-AP [phone])

## 2023-05-13 ENCOUNTER — Other Ambulatory Visit (HOSPITAL_COMMUNITY): Payer: Self-pay

## 2023-05-13 DIAGNOSIS — E1140 Type 2 diabetes mellitus with diabetic neuropathy, unspecified: Secondary | ICD-10-CM

## 2023-05-13 DIAGNOSIS — L089 Local infection of the skin and subcutaneous tissue, unspecified: Secondary | ICD-10-CM

## 2023-05-13 DIAGNOSIS — L03116 Cellulitis of left lower limb: Secondary | ICD-10-CM | POA: Diagnosis not present

## 2023-05-13 DIAGNOSIS — E11628 Type 2 diabetes mellitus with other skin complications: Secondary | ICD-10-CM | POA: Diagnosis not present

## 2023-05-13 DIAGNOSIS — F325 Major depressive disorder, single episode, in full remission: Secondary | ICD-10-CM

## 2023-05-13 LAB — GLUCOSE, CAPILLARY
Glucose-Capillary: 93 mg/dL (ref 70–99)
Glucose-Capillary: 215 mg/dL — ABNORMAL HIGH (ref 70–99)

## 2023-05-13 MED ORDER — LINEZOLID 600 MG PO TABS: 600.0000 mg | ORAL_TABLET | Freq: Two times a day (BID) | ORAL | 0 refills | Status: DC

## 2023-05-13 NOTE — Discharge Summary (Signed)
 Physician Discharge Summary  [name] WUJ:[id number] DOB: Sep 06, 1970 DOA: 05/11/2023  PCP: Erick Alley, DO  Admit date: 05/11/2023 Discharge date: 05/13/2023  Admitted From: Home Disposition:  Home  Recommendations for Outpatient Follow-up:  Follow up with PCP in 1-2 weeks Please obtain BMP/CBC in one week Please follow up on the following pending results:  Home Health: No Equipment/Devices:None  Discharge Condition:Stable CODE STATUS:Full Diet recommendation: Heart Healthy  Brief/Interim Summary: 53 y.o. female past medical history significant for insulin-dependent diabetes mellitus type 2, peripheral neuropathy and polyneuropathy on narcotics admitted for left big toe ulcer with infection which has been progressing.   Discharge Diagnoses:  Principal Problem:   Cellulitis Active Problems:   Type 2 diabetes mellitus with complication, without long-term current use of insulin (HCC)   Depression, major, in remission (HCC)   Neuropathy due to type 2 diabetes mellitus (HCC)   Obesity (BMI 30-39.9)   Diabetic foot infection (HCC)  Left lower extremity cellulitis: X-ray of the foot showed no signs of osteomyelitis on admission she was started on IV linezolid. Wound care was consulted recommended daily dressing changes. MRI of the foot was done that showed no osteomyelitis check some cellulitis.  Diabetes mellitus type 2 without complications/: No changes made to her medication continue current regimen, hemoglobin A1c of 6.7.  Depression: Continue Paxil.  Diabetic peripheral neuropathy: No change made to her medication continue current regimen.  Obesity: Noted.  Discharge Instructions  Discharge Instructions     Diet - low sodium heart healthy   Complete by: As directed    Discharge wound care:   Complete by: As directed    Per wound care instructions   Increase activity slowly   Complete by: As directed       Allergies as of 05/13/2023        Reactions   Vancomycin Other (See Comments)   Red Man's syndrome        Medication List     TAKE these medications    Accu-Chek Guide test strip Generic drug: glucose blood Use as instructed   Accu-Chek Softclix Lancet Dev Kit 1 applicator by Does not apply route in the morning, at noon, and at bedtime.   Accu-Chek Softclix Lancets lancets Use as instructed   aspirin 81 MG tablet Take 81 mg by mouth daily.   atorvastatin 80 MG tablet Commonly known as: LIPITOR TAKE 1 TABLET(80 MG) BY MOUTH DAILY   BD Pen Needle Nano 2nd Gen 32G X 4 MM Misc Generic drug: Insulin Pen Needle USE TO INJECT INSULIN DAILY Strength:32G X 4 MM   clonazePAM 0.5 MG tablet Commonly known as: KLONOPIN TAKE 3 TABLETS BY MOUTH AT BEDTIME AS NEEDED What changed:  how much to take how to take this when to take this additional instructions   enalapril 10 MG tablet Commonly known as: VASOTEC TAKE 1 TABLET BY MOUTH DAILY   fenofibrate 145 MG tablet Commonly known as: TRICOR TAKE 1 TABLET(145 MG) BY MOUTH DAILY   FreeStyle Libre 3 Plus Sensor Misc Change sensor every 15 days.   Lantus SoloStar 100 UNIT/ML Solostar Pen Generic drug: insulin glargine INJECT 34 UNITS INTO SKIN DAILY What changed: See the new instructions.   linaclotide 290 MCG Caps capsule Commonly known as: LINZESS Take 1 capsule (290 mcg total) by mouth daily 30 minutes before your first meal   linezolid 600 MG tablet Commonly known as: ZYVOX Take 1 tablet (600 mg total) by mouth 2 (two) times daily for 12 days.  metFORMIN 1000 MG tablet Commonly known as: GLUCOPHAGE TAKE 1 TABLET(1000 MG) BY MOUTH TWICE DAILY WITH A MEAL   morphine 30 MG 12 hr tablet Commonly known as: MS CONTIN Take 1 tablet (30 mg total) by mouth every 12 (twelve) hours.   morphine 30 MG tablet Commonly known as: MSIR One tablet at HS, may take a tablet during the day.   multivitamin tablet Take 1 tablet by mouth daily.   mupirocin  ointment 2 % Commonly known as: BACTROBAN Apply 1 Application topically 2 (two) times daily. What changed:  when to take this reasons to take this   nortriptyline 50 MG capsule Commonly known as: PAMELOR Take 50 mg by mouth at bedtime.   Ozempic (2 MG/DOSE) 8 MG/3ML Sopn Generic drug: Semaglutide (2 MG/DOSE) Inject 2 mg into the skin once a week.   PARoxetine 40 MG tablet Commonly known as: PAXIL Take 1 tablet (40 mg total) by mouth every morning.   tiZANidine 2 MG tablet Commonly known as: ZANAFLEX TAKE 1 TABLET(2 MG) BY MOUTH THREE TIMES DAILY What changed: See the new instructions.   VITAMIN D (CHOLECALCIFEROL) PO Take 1 capsule by mouth daily.               Discharge Care Instructions  (From admission, onward)           Start     Ordered   05/13/23 0000  Discharge wound care:       Comments: Per wound care instructions   05/13/23 0942            Allergies  Allergen Reactions   Vancomycin Other (See Comments)    Red Man's syndrome    Consultations: None   Procedures/Studies: MR FOOT LEFT WO CONTRAST Result Date: 05/13/2023 CLINICAL DATA:  Osteomyelitis, foot Area of concern is in left toe (great toe, per recent radiographs) EXAM: MRI OF THE LEFT FOOT WITHOUT CONTRAST TECHNIQUE: Multiplanar, multisequence MR imaging of the left forefoot was performed. No intravenous contrast was administered. COMPARISON:  Radiographs 05/11/2023 and 04/03/2018 FINDINGS: Bones/Joint/Cartilage No evidence of acute fracture, dislocation or osteomyelitis. Minor degenerative changes of the 1st metatarsophalangeal joint. Mild degenerative changes at the Lisfranc joint with mild subchondral cyst formation in the base of the 4th metatarsal. No erosive changes or other significant arthropathic changes identified. Ligaments Intact Lisfranc ligament. The collateral ligaments of the metatarsophalangeal joints are intact. Muscles and Tendons Severe, diffuse fatty forefoot muscular  atrophy. No tendon tear or significant tenosynovitis. Soft tissues There appears to be some bandage material within the 1st web space. No focal skin ulceration or underlying fluid collection identified. Possible mild skin thickening of the great toe laterally. Mild nonspecific dorsal subcutaneous edema. No evidence of foreign body or soft tissue emphysema. IMPRESSION: 1. No evidence of osteomyelitis or abscess. 2. Mild degenerative changes at the Lisfranc joint and 1st metatarsophalangeal joint. 3. Severe, diffuse fatty forefoot muscular atrophy. Electronically Signed   By: Carey Bullocks M.D.   On: 05/13/2023 08:13   DG Foot Complete Left Result Date: 05/11/2023 CLINICAL DATA:  53 year old female with history of diabetic wound on the left great toe. EXAM: LEFT FOOT - COMPLETE 3+ VIEW COMPARISON:  No priors. FINDINGS: There is no evidence of fracture or dislocation. There is no evidence of arthropathy or other focal bone abnormality. Soft tissues are unremarkable. IMPRESSION: Negative. Electronically Signed   By: Trudie Reed M.D.   On: 05/11/2023 05:46   (Echo, Carotid, EGD, Colonoscopy, ERCP)    Subjective: No  complaints  Discharge Exam: Vitals:   05/12/23 2225 05/13/23 0643  BP: (!) 119/98 104/80  Pulse: 85 77  Resp: 16 15  Temp: 98 F (36.7 C) 97.7 F (36.5 C)  SpO2: 100% 97%   Vitals:   05/12/23 0924 05/12/23 1321 05/12/23 2225 05/13/23 0643  BP: (!) 105/51 (!) 100/53 (!) 119/98 104/80  Pulse: 82 76 85 77  Resp: 16 18 16 15   Temp: 97.6 F (36.4 C) 97.7 F (36.5 C) 98 F (36.7 C) 97.7 F (36.5 C)  TempSrc: Oral Oral Oral Oral  SpO2: 98% 99% 100% 97%  Weight:      Height:        General: Pt is alert, awake, not in acute distress Cardiovascular: RRR, S1/S2 +, no rubs, no gallops Respiratory: CTA bilaterally, no wheezing, no rhonchi Abdominal: Soft, NT, ND, bowel sounds + Extremities: no edema, no cyanosis    The results of significant diagnostics from this  hospitalization (including imaging, microbiology, ancillary and laboratory) are listed below for reference.     Microbiology: Recent Results (from the past 240 hours)  Culture, blood (Routine X 2) w Reflex to ID Panel     Status: None (Preliminary result)   Collection Time: 05/12/23  8:26 AM   Specimen: BLOOD LEFT ARM  Result Value Ref Range Status   Specimen Description   Final    BLOOD LEFT ARM Performed at [facility], [address], [place], [place] [address]    Special Requests   Final    BOTTLES DRAWN AEROBIC AND ANAEROBIC Blood Culture results may not be optimal due to an inadequate volume of blood received in culture bottles Performed at [facility], [address], [place], [place] [address]    Culture   Final    NO GROWTH < 24 HOURS Performed at [facility], [address], [place], [place] [address]    Report Status PENDING  Incomplete  Culture, blood (Routine X 2) w Reflex to ID Panel     Status: None (Preliminary result)   Collection Time: 05/12/23  8:38 AM   Specimen: BLOOD LEFT ARM  Result Value Ref Range Status   Specimen Description   Final    BLOOD LEFT ARM Performed at [facility], [address], [place], [place] [address]    Special Requests   Final    BOTTLES DRAWN AEROBIC AND ANAEROBIC Blood Culture results may not be optimal due to an inadequate volume of blood received in culture bottles Performed at [facility], [address], [place], [place] [address]    Culture   Final    NO GROWTH < 24 HOURS Performed at [facility], [address], [place], [place] [address]    Report Status PENDING  Incomplete     Labs: BNP (last 3 results) No results for input(s): "BNP" in the last 8760 hours. Basic Metabolic Panel: Recent Labs  Lab 05/11/23 0443 05/12/23 0441  NA 138 138  K 3.7 4.1  CL 103 104  CO2 25 25  GLUCOSE 203* 119*  BUN 17 17  CREATININE 0.62 0.49  CALCIUM 9.6 9.6    Liver Function Tests: No results for input(s): "AST", "ALT", "ALKPHOS", "BILITOT", "PROT", "ALBUMIN" in the last 168 hours. No results for input(s): "LIPASE", "AMYLASE" in the last 168 hours. No results for input(s): "AMMONIA" in the last 168 hours. CBC: Recent Labs  Lab 05/11/23 0443 05/12/23 0441  WBC 9.7  6.4  NEUTROABS 5.6  --   HGB 12.7 12.6  HCT 39.9 40.7  MCV 86.0 87.5  PLT 288 289   Cardiac Enzymes: No results for input(s): "CKTOTAL", "CKMB", "CKMBINDEX", "TROPONINI" in the last 168 hours. BNP: Invalid input(s): "POCBNP" CBG: Recent Labs  Lab 05/12/23 0739 05/12/23 1136 05/12/23 1633 05/12/23 2223 05/13/23 0718  GLUCAP 99 145* 180* 138* 93   D-Dimer No results for input(s): "DDIMER" in the last 72 hours. Hgb A1c Recent Labs    05/11/23 1359  HGBA1C 6.7*   Lipid Profile No results for input(s): "CHOL", "HDL", "LDLCALC", "TRIG", "CHOLHDL", "LDLDIRECT" in the last 72 hours. Thyroid function studies No results for input(s): "TSH", "T4TOTAL", "T3FREE", "THYROIDAB" in the last 72 hours.  Invalid input(s): "FREET3" Anemia work up No results for input(s): "VITAMINB12", "FOLATE", "FERRITIN", "TIBC", "IRON", "RETICCTPCT" in the last 72 hours. Urinalysis    Component Value Date/Time   COLORURINE YELLOW 06/06/2015 1425   APPEARANCEUR Clear 11/20/2017 1553   LABSPEC 1.025 06/06/2015 1425   LABSPEC 1.030 11/10/2013 1718   PHURINE 5.0 06/06/2015 1425   GLUCOSEU Negative 11/20/2017 1553   GLUCOSEU Negative 11/10/2013 1718   HGBUR NEGATIVE 06/06/2015 1425   BILIRUBINUR Negative 11/20/2017 1553   BILIRUBINUR Negative 11/10/2013 1718   KETONESUR small (15) (A) 07/11/2017 1512   KETONESUR 15 (A) 06/06/2015 1425   PROTEINUR 1+ (A) 11/20/2017 1553   PROTEINUR 100 (A) 06/06/2015 1425   UROBILINOGEN 0.2 07/11/2017 1512   UROBILINOGEN 0.2 07/27/2009 0945   NITRITE Negative 11/20/2017 1553   NITRITE NEGATIVE 06/06/2015 1425   LEUKOCYTESUR Negative 11/20/2017 1553    LEUKOCYTESUR Negative 11/10/2013 1718   Sepsis Labs Recent Labs  Lab 05/11/23 0443 05/12/23 0441  WBC 9.7 6.4   Microbiology Recent Results (from the past 240 hours)  Culture, blood (Routine X 2) w Reflex to ID Panel     Status: None (Preliminary result)   Collection Time: 05/12/23  8:26 AM   Specimen: BLOOD LEFT ARM  Result Value Ref Range Status   Specimen Description   Final    BLOOD LEFT ARM Performed at [facility], [address] [place], [place] [address]    Special Requests   Final    BOTTLES DRAWN AEROBIC AND ANAEROBIC Blood Culture results may not be optimal due to an inadequate volume of blood received in culture bottles Performed at [facility], [address], [place], [place] [address]    Culture   Final    NO GROWTH < 24 HOURS Performed at [facility], [address] [place], [place] [address]    Report Status PENDING  Incomplete  Culture, blood (Routine X 2) w Reflex to ID Panel     Status: None (Preliminary result)   Collection Time: 05/12/23  8:38 AM   Specimen: BLOOD LEFT ARM  Result Value Ref Range Status   Specimen Description   Final    BLOOD LEFT ARM Performed at [facility], [address] [place], [place] [address]    Special Requests   Final    BOTTLES DRAWN AEROBIC AND ANAEROBIC Blood Culture results may not be optimal due to an inadequate volume of blood received in culture bottles Performed at [facility], [address], [place], [place] [address]    Culture   Final    NO GROWTH < 24 HOURS Performed at [facility], [address] [place], [place] [address]    Report Status  PENDING  Incomplete     Time coordinating discharge: Over 35 minutes  SIGNED:   Marinda Elk, MD  [facility] 05/13/2023, 9:42 AM Pager   If 7PM-7AM, please contact night-coverage [email or web address] Password [email or web address]

## 2023-05-13 NOTE — Progress Notes (Signed)
   05/13/23 0940  TOC Brief Assessment  Insurance and Status Reviewed  Patient has primary care physician Yes  Home environment has been reviewed home with daughter  Prior level of function: independent  Prior/Current Home Services No current home services  Social Drivers of Health Review SDOH reviewed interventions complete  Readmission risk has been reviewed Yes  Transition of care needs no transition of care needs at this time

## 2023-05-13 NOTE — Progress Notes (Signed)
 [facility] PROGRESS NOTE    Progress Note  [name]  WJX:[id number] DOB: [birth year] DOA: 05/11/2023 PCP: Erick Alley, DO     Brief Narrative:   [name] is an 53 y.o. female past medical history significant for insulin-dependent diabetes mellitus type 2, peripheral neuropathy and polyneuropathy on narcotics admitted for left big toe ulcer with infection which has been progressing.   Assessment/Plan:   Diabetic foot left great toe ulcer/left lower extremity cellulitis: X-ray of the left foot showed no signs of osteomyelitis. Antibiotics change oral Linezolid. Wound care has been consulted. MRI results are pending.  Type 2 diabetes mellitus with complication, without long-term current use of insulin (HCC) Uncontrolled diabetes mellitus type 2: Cont. long-acting insulin plus sliding scale insulin, BG controlled. A1c 6.7.  Depression, major, in remission [facility]): Continue Paxil  Neuropathy due to type 2 diabetes mellitus (HCC) Resume home medications.  Obesity (BMI 30-39.9) Noted.  DVT prophylaxis: loveno Family Communication:none Status is: Observation The patient will require care spanning > 2 midnights and should be moved to inpatient because: Diabetic foot infection    Code Status:     Code Status Orders  (From admission, onward)           Start     Ordered   05/11/23 0933  Full code  Continuous       Question:  By:  Answer:  Consent: discussion documented in EHR   05/11/23 0933           Code Status History     Date Active Date Inactive Code Status Order ID Comments User Context   05/09/2018 0228 05/09/2018 1631 Full Code [id number]  Altamese Dilling, MD Inpatient         IV Access:   Peripheral IV   Procedures and diagnostic studies:   No results found.    Medical Consultants:   None.   Subjective:    [name] relates her pain is controlled.  Objective:    Vitals:   05/12/23  0924 05/12/23 1321 05/12/23 2225 05/13/23 0643  BP: (!) 105/51 (!) 100/53 (!) 119/98 104/80  Pulse: 82 76 85 77  Resp: 16 18 16 15   Temp: 97.6 F (36.4 C) 97.7 F (36.5 C) 98 F (36.7 C) 97.7 F (36.5 C)  TempSrc: Oral Oral Oral Oral  SpO2: 98% 99% 100% 97%  Weight:      Height:       SpO2: 97 %   Intake/Output Summary (Last 24 hours) at 05/13/2023 0701 Last data filed at 05/13/2023 0506 Gross per 24 hour  Intake 1060 ml  Output 1300 ml  Net -240 ml   Filed Weights   05/11/23 1300  Weight: 115.7 kg    Exam: General exam: In no acute distress. Respiratory system: Good air movement and clear to auscultation. Cardiovascular system: S1 & S2 heard, RRR. No JVD. Gastrointestinal system: Abdomen is nondistended, soft and nontender.  Extremities: No pedal edema. Skin: The left great toe is significantly swollen and erythematous and with a medial ulcer    Data Reviewed:    Labs: Basic Metabolic Panel: Recent Labs  Lab 05/11/23 0443 05/12/23 0441  NA 138 138  K 3.7 4.1  CL 103 104  CO2 25 25  GLUCOSE 203* 119*  BUN 17 17  CREATININE 0.62 0.49  CALCIUM 9.6 9.6   GFR Estimated Creatinine Clearance: 113 mL/min (by C-G formula based on SCr of 0.49 mg/dL). Liver Function Tests: No results for input(s): "AST", "  ALT", "ALKPHOS", "BILITOT", "PROT", "ALBUMIN" in the last 168 hours. No results for input(s): "LIPASE", "AMYLASE" in the last 168 hours. No results for input(s): "AMMONIA" in the last 168 hours. Coagulation profile No results for input(s): "INR", "PROTIME" in the last 168 hours. COVID-19 Labs  No results for input(s): "DDIMER", "FERRITIN", "LDH", "CRP" in the last 72 hours.  No results found for: "SARSCOV2NAA"  CBC: Recent Labs  Lab 05/11/23 0443 05/12/23 0441  WBC 9.7 6.4  NEUTROABS 5.6  --   HGB 12.7 12.6  HCT 39.9 40.7  MCV 86.0 87.5  PLT 288 289   Cardiac Enzymes: No results for input(s): "CKTOTAL", "CKMB", "CKMBINDEX", "TROPONINI" in the  last 168 hours. BNP (last 3 results) No results for input(s): "PROBNP" in the last 8760 hours. CBG: Recent Labs  Lab 05/11/23 2143 05/12/23 0739 05/12/23 1136 05/12/23 1633 05/12/23 2223  GLUCAP 195* 99 145* 180* 138*   D-Dimer: No results for input(s): "DDIMER" in the last 72 hours. Hgb A1c: Recent Labs    05/11/23 1359  HGBA1C 6.7*   Lipid Profile: No results for input(s): "CHOL", "HDL", "LDLCALC", "TRIG", "CHOLHDL", "LDLDIRECT" in the last 72 hours. Thyroid function studies: No results for input(s): "TSH", "T4TOTAL", "T3FREE", "THYROIDAB" in the last 72 hours.  Invalid input(s): "FREET3" Anemia work up: No results for input(s): "VITAMINB12", "FOLATE", "FERRITIN", "TIBC", "IRON", "RETICCTPCT" in the last 72 hours. Sepsis Labs: Recent Labs  Lab 05/11/23 0443 05/12/23 0441  WBC 9.7 6.4   Microbiology Recent Results (from the past 240 hours)  Culture, blood (Routine X 2) w Reflex to ID Panel     Status: None (Preliminary result)   Collection Time: 05/12/23  8:26 AM   Specimen: BLOOD LEFT ARM  Result Value Ref Range Status   Specimen Description   Final    BLOOD LEFT ARM Performed at [facility], [address] [place], [place] [address]    Special Requests   Final    BOTTLES DRAWN AEROBIC AND ANAEROBIC Blood Culture results may not be optimal due to an inadequate volume of blood received in culture bottles Performed at [facility], [address] [place], [place] [address]    Culture PENDING  Incomplete   Report Status PENDING  Incomplete  Culture, blood (Routine X 2) w Reflex to ID Panel     Status: None (Preliminary result)   Collection Time: 05/12/23  8:38 AM   Specimen: BLOOD LEFT ARM  Result Value Ref Range Status   Specimen Description   Final    BLOOD LEFT ARM Performed at [facility], [address] [place], [place] [address]    Special Requests   Final    BOTTLES DRAWN AEROBIC AND ANAEROBIC Blood Culture results  may not be optimal due to an inadequate volume of blood received in culture bottles Performed at [facility], [address] [place], [place] [address]    Culture PENDING  Incomplete   Report Status PENDING  Incomplete     Medications:    aspirin EC  81 mg Oral Daily   atorvastatin  80 mg Oral Daily   clonazePAM  1 mg Oral QHS   enalapril  10 mg Oral Daily   enoxaparin (LOVENOX) injection  60 mg Subcutaneous Daily   fenofibrate  160 mg Oral Daily   influenza vac split trivalent PF  0.5 mL Intramuscular Tomorrow-1000   insulin aspart  0-15 Units Subcutaneous TID WC   insulin aspart  0-5  Units Subcutaneous QHS   insulin aspart  4 Units Subcutaneous TID WC   insulin glargine-yfgn  25 Units Subcutaneous Daily   morphine  30 mg Oral Q12H   nortriptyline  50 mg Oral QHS   PARoxetine  40 mg Oral Daily   pneumococcal 20-valent conjugate vaccine  0.5 mL Intramuscular Tomorrow-1000   tiZANidine  2 mg Oral TID   Continuous Infusions:  cefTRIAXone (ROCEPHIN)  IV 2 g (05/12/23 1610)   linezolid (ZYVOX) IV 600 mg (05/12/23 2218)      LOS: 1 day   Marinda Elk  [facility]  05/13/2023, 7:01 AM

## 2023-05-15 ENCOUNTER — Encounter: Payer: Self-pay | Admitting: Student

## 2023-05-15 ENCOUNTER — Other Ambulatory Visit: Payer: Self-pay | Admitting: Physical Medicine & Rehabilitation

## 2023-05-16 ENCOUNTER — Encounter: Payer: Self-pay | Admitting: Student

## 2023-05-16 ENCOUNTER — Encounter: Payer: Self-pay | Admitting: Gastroenterology

## 2023-05-17 ENCOUNTER — Other Ambulatory Visit: Payer: Self-pay | Admitting: Student

## 2023-05-17 DIAGNOSIS — L03116 Cellulitis of left lower limb: Secondary | ICD-10-CM

## 2023-05-17 LAB — CULTURE, BLOOD (ROUTINE X 2)

## 2023-05-17 MED ORDER — DOXYCYCLINE HYCLATE 100 MG PO CAPS: 100.0000 mg | ORAL_CAPSULE | Freq: Two times a day (BID) | ORAL | 0 refills | Status: DC

## 2023-05-17 NOTE — Progress Notes (Signed)
 I have sent in rx doxycycline as pt did not tolerate linezolid (see my chart messages). ED precautions given and pt advised to make apt for next week for hospital f/u visit

## 2023-05-18 DIAGNOSIS — G4733 Obstructive sleep apnea (adult) (pediatric): Secondary | ICD-10-CM | POA: Diagnosis not present

## 2023-05-20 ENCOUNTER — Other Ambulatory Visit: Payer: Self-pay | Admitting: Registered Nurse

## 2023-05-20 ENCOUNTER — Encounter: Payer: Medicaid Other | Admitting: Registered Nurse

## 2023-05-20 ENCOUNTER — Telehealth: Payer: Self-pay | Admitting: Registered Nurse

## 2023-05-20 DIAGNOSIS — F341 Dysthymic disorder: Secondary | ICD-10-CM

## 2023-05-20 MED ORDER — CLONAZEPAM 0.5 MG PO TABS: ORAL_TABLET | ORAL | 3 refills | Status: DC

## 2023-05-20 NOTE — Telephone Encounter (Signed)
 PMP was Reviewed.  Clonazepam e-scribed to pharmacy.  Ms. Mensch is aware via My-Chart message.

## 2023-05-21 MED ORDER — NALOXEGOL OXALATE 12.5 MG PO TABS
ORAL_TABLET | ORAL | 1 refills | Status: DC
Start: 2023-05-21 — End: 2023-10-17

## 2023-05-22 ENCOUNTER — Telehealth: Payer: Self-pay

## 2023-05-22 ENCOUNTER — Ambulatory Visit: Payer: Medicaid Other | Admitting: Obstetrics & Gynecology

## 2023-05-22 ENCOUNTER — Encounter: Payer: Self-pay | Admitting: Obstetrics & Gynecology

## 2023-05-22 ENCOUNTER — Encounter: Payer: Self-pay | Admitting: Student

## 2023-05-22 NOTE — Telephone Encounter (Signed)
 Pharmacy Patient Advocate Encounter   Received notification from CoverMyMeds that prior authorization for MOVANTIK 12.5MG  tablets is required/requested.   Insurance verification completed.   The patient is insured through [facility] .   Per test claim: PA required; PA submitted to above mentioned insurance via CoverMyMeds Key/confirmation #/EOC [id number] Status is pending

## 2023-05-24 ENCOUNTER — Other Ambulatory Visit: Payer: Self-pay | Admitting: Student

## 2023-05-27 ENCOUNTER — Telehealth: Payer: Self-pay | Admitting: Registered Nurse

## 2023-05-27 MED ORDER — MORPHINE SULFATE ER 30 MG PO TBCR: 30.0000 mg | EXTENDED_RELEASE_TABLET | Freq: Two times a day (BID) | ORAL | 0 refills | Status: DC

## 2023-05-27 MED ORDER — MORPHINE SULFATE 30 MG PO TABS: ORAL_TABLET | ORAL | 0 refills | Status: DC

## 2023-05-27 NOTE — Telephone Encounter (Signed)
 Could you Please do A PA for Movantik? Thank you.

## 2023-05-27 NOTE — Telephone Encounter (Signed)
 PMP was Reviewed.  Morphine ER and MSIR e-scribed to pharmacy.  [name] is aware via My-Chart

## 2023-05-28 ENCOUNTER — Telehealth: Payer: Self-pay | Admitting: *Deleted

## 2023-05-28 NOTE — Telephone Encounter (Signed)
 Prior auth submitted for  MSIR 30 mg tablets #60 to [facility] via [employer].  Marjory Tarbell (KeyDurene Romans) - [id number]

## 2023-05-29 ENCOUNTER — Other Ambulatory Visit: Payer: Medicaid Other | Admitting: Gastroenterology

## 2023-05-29 NOTE — Telephone Encounter (Signed)
 Outcome N/A on March 4 by [employer] 2017 NCPDP We received a prior authorization request for the member and product listed above. The [facility] Prior Authorization Team is not able to review this request because the requested product has been previously approved under [id number]. Based on the information reviewed, the requested prescription is currently authorized for coverage by the plan until 07/25/2023. Please resubmit this request within 30 days of authorization expiration date.

## 2023-05-30 ENCOUNTER — Encounter: Payer: Medicaid Other | Admitting: Registered Nurse

## 2023-05-31 ENCOUNTER — Encounter (HOSPITAL_COMMUNITY): Payer: Self-pay | Admitting: Family Medicine

## 2023-05-31 ENCOUNTER — Other Ambulatory Visit: Payer: Self-pay

## 2023-05-31 ENCOUNTER — Inpatient Hospital Stay (HOSPITAL_COMMUNITY)
Admission: AD | Admit: 2023-05-31 | Discharge: 2023-06-03 | DRG: 638 | Disposition: A | Discharge status: Home / Self Care | Source: Ambulatory Visit | Attending: Family Medicine | Admitting: Family Medicine

## 2023-05-31 ENCOUNTER — Encounter (HOSPITAL_COMMUNITY): Payer: Self-pay

## 2023-05-31 ENCOUNTER — Ambulatory Visit: Admitting: Student

## 2023-05-31 VITALS — BP 140/64 | HR 93 | Ht 72.0 in | Wt 261.6 lb

## 2023-05-31 DIAGNOSIS — E1142 Type 2 diabetes mellitus with diabetic polyneuropathy: Secondary | ICD-10-CM | POA: Diagnosis present

## 2023-05-31 DIAGNOSIS — K219 Gastro-esophageal reflux disease without esophagitis: Secondary | ICD-10-CM | POA: Diagnosis present

## 2023-05-31 DIAGNOSIS — L03116 Cellulitis of left lower limb: Secondary | ICD-10-CM | POA: Diagnosis not present

## 2023-05-31 DIAGNOSIS — E781 Pure hyperglyceridemia: Secondary | ICD-10-CM | POA: Diagnosis not present

## 2023-05-31 DIAGNOSIS — F419 Anxiety disorder, unspecified: Secondary | ICD-10-CM | POA: Diagnosis present

## 2023-05-31 DIAGNOSIS — L03119 Cellulitis of unspecified part of limb: Secondary | ICD-10-CM

## 2023-05-31 DIAGNOSIS — G8929 Other chronic pain: Secondary | ICD-10-CM | POA: Diagnosis present

## 2023-05-31 DIAGNOSIS — K5900 Constipation, unspecified: Secondary | ICD-10-CM | POA: Diagnosis not present

## 2023-05-31 DIAGNOSIS — F32A Depression, unspecified: Secondary | ICD-10-CM | POA: Diagnosis not present

## 2023-05-31 DIAGNOSIS — Z79899 Other long term (current) drug therapy: Secondary | ICD-10-CM

## 2023-05-31 DIAGNOSIS — Z881 Allergy status to other antibiotic agents status: Secondary | ICD-10-CM

## 2023-05-31 DIAGNOSIS — Z888 Allergy status to other drugs, medicaments and biological substances status: Secondary | ICD-10-CM

## 2023-05-31 DIAGNOSIS — L270 Generalized skin eruption due to drugs and medicaments taken internally: Secondary | ICD-10-CM | POA: Diagnosis not present

## 2023-05-31 DIAGNOSIS — L089 Local infection of the skin and subcutaneous tissue, unspecified: Principal | ICD-10-CM | POA: Diagnosis present

## 2023-05-31 DIAGNOSIS — E11319 Type 2 diabetes mellitus with unspecified diabetic retinopathy without macular edema: Secondary | ICD-10-CM | POA: Diagnosis not present

## 2023-05-31 DIAGNOSIS — Z823 Family history of stroke: Secondary | ICD-10-CM | POA: Diagnosis not present

## 2023-05-31 DIAGNOSIS — T368X5A Adverse effect of other systemic antibiotics, initial encounter: Secondary | ICD-10-CM | POA: Diagnosis present

## 2023-05-31 DIAGNOSIS — L97509 Non-pressure chronic ulcer of other part of unspecified foot with unspecified severity: Secondary | ICD-10-CM | POA: Diagnosis not present

## 2023-05-31 DIAGNOSIS — S91302A Unspecified open wound, left foot, initial encounter: Secondary | ICD-10-CM | POA: Diagnosis not present

## 2023-05-31 DIAGNOSIS — M6250 Muscle wasting and atrophy, not elsewhere classified, unspecified site: Secondary | ICD-10-CM | POA: Diagnosis present

## 2023-05-31 DIAGNOSIS — Z794 Long term (current) use of insulin: Secondary | ICD-10-CM | POA: Diagnosis not present

## 2023-05-31 DIAGNOSIS — E1140 Type 2 diabetes mellitus with diabetic neuropathy, unspecified: Secondary | ICD-10-CM | POA: Diagnosis not present

## 2023-05-31 DIAGNOSIS — I10 Essential (primary) hypertension: Secondary | ICD-10-CM | POA: Diagnosis present

## 2023-05-31 DIAGNOSIS — E11628 Type 2 diabetes mellitus with other skin complications: Secondary | ICD-10-CM | POA: Diagnosis not present

## 2023-05-31 DIAGNOSIS — E1139 Type 2 diabetes mellitus with other diabetic ophthalmic complication: Secondary | ICD-10-CM | POA: Diagnosis not present

## 2023-05-31 LAB — CBC WITH DIFFERENTIAL/PLATELET
Abs Immature Granulocytes: 0.03 10*3/uL (ref 0.00–0.07)
Basophils Absolute: 0.1 10*3/uL (ref 0.0–0.1)
Basophils Relative: 1 %
Eosinophils Absolute: 0.3 10*3/uL (ref 0.0–0.5)
Eosinophils Relative: 3 %
HCT: 42.9 % (ref 36.0–46.0)
Hemoglobin: 13.9 g/dL (ref 12.0–15.0)
Immature Granulocytes: 0 %
Lymphocytes Relative: 36 %
Lymphs Abs: 3.7 10*3/uL (ref 0.7–4.0)
MCH: 27.6 pg (ref 26.0–34.0)
MCHC: 32.4 g/dL (ref 30.0–36.0)
MCV: 85.3 fL (ref 80.0–100.0)
Monocytes Absolute: 0.6 10*3/uL (ref 0.1–1.0)
Monocytes Relative: 6 %
Neutro Abs: 5.4 10*3/uL (ref 1.7–7.7)
Neutrophils Relative %: 54 %
Platelets: 350 10*3/uL (ref 150–400)
RBC: 5.03 MIL/uL (ref 3.87–5.11)
RDW: 14.5 % (ref 11.5–15.5)
WBC: 10 10*3/uL (ref 4.0–10.5)
nRBC: 0 % (ref 0.0–0.2)

## 2023-05-31 LAB — BASIC METABOLIC PANEL
Anion gap: 12 (ref 5–15)
BUN: 20 mg/dL (ref 6–20)
CO2: 24 mmol/L (ref 22–32)
Calcium: 10 mg/dL (ref 8.9–10.3)
Chloride: 101 mmol/L (ref 98–111)
Creatinine, Ser: 0.73 mg/dL (ref 0.44–1.00)
GFR, Estimated: >60 mL/min (ref >60)
Glucose, Bld: 153 mg/dL — ABNORMAL HIGH (ref 70–99)
Potassium: 4 mmol/L (ref 3.5–5.1)
Sodium: 137 mmol/L (ref 135–145)

## 2023-05-31 LAB — GLUCOSE, CAPILLARY: Glucose-Capillary: 151 mg/dL — ABNORMAL HIGH (ref 70–99)

## 2023-05-31 MED ORDER — PAROXETINE HCL 20 MG PO TABS
40.0000 mg | ORAL_TABLET | Freq: Every day | ORAL | Status: DC
  Administered 2023-05-31 – 2023-06-02 (×3): 40 mg via ORAL
  Filled 2023-05-31 (×4): qty 2

## 2023-05-31 MED ORDER — ENOXAPARIN SODIUM 60 MG/0.6ML IJ SOSY
60.0000 mg | PREFILLED_SYRINGE | INTRAMUSCULAR | Status: DC
  Administered 2023-05-31 – 2023-06-02 (×3): 60 mg via SUBCUTANEOUS
  Filled 2023-05-31 (×3): qty 0.6

## 2023-05-31 MED ORDER — CLONAZEPAM 0.5 MG PO TABS
1.5000 mg | ORAL_TABLET | Freq: Every evening | ORAL | Status: DC | PRN
  Administered 2023-06-01: 1.5 mg via ORAL
  Filled 2023-05-31: qty 3

## 2023-05-31 MED ORDER — INSULIN ASPART 100 UNIT/ML IJ SOLN: 0.0000 [IU] | Freq: Every day | INTRAMUSCULAR | Status: DC

## 2023-05-31 MED ORDER — ACETAMINOPHEN 325 MG PO TABS
650.0000 mg | ORAL_TABLET | Freq: Four times a day (QID) | ORAL | Status: DC | PRN
  Administered 2023-05-31: 650 mg via ORAL
  Filled 2023-05-31: qty 2

## 2023-05-31 MED ORDER — INSULIN ASPART 100 UNIT/ML IJ SOLN
0.0000 [IU] | Freq: Three times a day (TID) | INTRAMUSCULAR | Status: DC
  Administered 2023-06-01: 1 [IU] via SUBCUTANEOUS
  Administered 2023-06-02: 2 [IU] via SUBCUTANEOUS
  Administered 2023-06-02: 1 [IU] via SUBCUTANEOUS
  Administered 2023-06-03: 2 [IU] via SUBCUTANEOUS

## 2023-05-31 MED ORDER — ACETAMINOPHEN 650 MG RE SUPP: 650.0000 mg | Freq: Four times a day (QID) | RECTAL | Status: DC | PRN

## 2023-05-31 MED ORDER — ASPIRIN 81 MG PO TBEC
81.0000 mg | DELAYED_RELEASE_TABLET | Freq: Every day | ORAL | Status: DC
  Administered 2023-05-31 – 2023-06-03 (×4): 81 mg via ORAL
  Filled 2023-05-31 (×4): qty 1

## 2023-05-31 MED ORDER — INSULIN GLARGINE 100 UNIT/ML ~~LOC~~ SOLN
34.0000 [IU] | Freq: Every day | SUBCUTANEOUS | Status: DC
  Administered 2023-05-31 – 2023-06-02 (×3): 34 [IU] via SUBCUTANEOUS
  Filled 2023-05-31 (×4): qty 0.34

## 2023-05-31 MED ORDER — NORTRIPTYLINE HCL 25 MG PO CAPS
50.0000 mg | ORAL_CAPSULE | Freq: Every day | ORAL | Status: DC
  Administered 2023-05-31 – 2023-06-02 (×3): 50 mg via ORAL
  Filled 2023-05-31 (×4): qty 2

## 2023-05-31 MED ORDER — INSULIN GLARGINE-YFGN 100 UNIT/ML ~~LOC~~ SOLN: 34.0000 [IU] | Freq: Every day | SUBCUTANEOUS | Status: DC

## 2023-05-31 MED ORDER — MORPHINE SULFATE ER 15 MG PO TBCR
30.0000 mg | EXTENDED_RELEASE_TABLET | Freq: Two times a day (BID) | ORAL | Status: DC
  Administered 2023-05-31 – 2023-06-03 (×6): 30 mg via ORAL
  Filled 2023-05-31 (×6): qty 2

## 2023-05-31 MED ORDER — SODIUM CHLORIDE 0.9 % IV SOLN
2.0000 g | INTRAVENOUS | Status: DC
  Administered 2023-05-31 – 2023-06-02 (×3): 2 g via INTRAVENOUS
  Filled 2023-05-31 (×3): qty 20

## 2023-05-31 MED ORDER — MORPHINE SULFATE 15 MG PO TABS
30.0000 mg | ORAL_TABLET | Freq: Two times a day (BID) | ORAL | Status: DC | PRN
  Administered 2023-06-01 – 2023-06-02 (×3): 30 mg via ORAL
  Filled 2023-05-31 (×3): qty 2

## 2023-05-31 MED ORDER — VANCOMYCIN HCL 2000 MG/400ML IV SOLN
2000.0000 mg | Freq: Once | INTRAVENOUS | Status: AC
  Administered 2023-05-31: 2000 mg via INTRAVENOUS
  Filled 2023-05-31: qty 400

## 2023-05-31 MED ORDER — SULFAMETHOXAZOLE-TRIMETHOPRIM 800-160 MG PO TABS
1.0000 | ORAL_TABLET | Freq: Two times a day (BID) | ORAL | 0 refills | Status: DC
Start: 2023-05-31 — End: 2023-06-03

## 2023-05-31 MED ORDER — ATORVASTATIN CALCIUM 80 MG PO TABS
80.0000 mg | ORAL_TABLET | Freq: Every day | ORAL | Status: DC
  Administered 2023-05-31 – 2023-06-03 (×4): 80 mg via ORAL
  Filled 2023-05-31 (×4): qty 1

## 2023-05-31 MED ORDER — TIZANIDINE HCL 4 MG PO TABS
2.0000 mg | ORAL_TABLET | Freq: Every day | ORAL | Status: DC | PRN
  Administered 2023-06-01 – 2023-06-03 (×2): 2 mg via ORAL
  Filled 2023-05-31 (×2): qty 1

## 2023-05-31 MED ORDER — VANCOMYCIN HCL 1250 MG/250ML IV SOLN
1250.0000 mg | Freq: Two times a day (BID) | INTRAVENOUS | Status: DC
  Administered 2023-06-01 – 2023-06-03 (×5): 1250 mg via INTRAVENOUS
  Filled 2023-05-31 (×6): qty 250

## 2023-05-31 NOTE — Plan of Care (Signed)

## 2023-05-31 NOTE — H&P (Addendum)
 Hospital Admission History and Physical Service Pager: [phone]  Patient name: [name] Medical record number: [id number] Date of Birth: Aug 20, 1970 Age: 53 y.o. Gender: female  Primary Care Provider: Erick Alley, DO Consultants: None Code Status: FULL Preferred Emergency Contact:  [name] (daughter) [phone]  Chief Complaint: Infected diabetic foot wound  Assessment and Plan: [name] is a 53 y.o. female presenting with chronic diabetic foot wound, infected.  Pertinent past medical includes type 2 diabetes (on insulin) and severe peripheral neuropathy.  Assessment & Plan Infected wound Chronic diabetic foot wound on left foot, now with infection. Not painful 2/2 neuropathy. No systemic symptoms or signs of sepsis.  Patient with +2 DP pulses bilaterally. - Admit to FMTS, Med-Surg, attending Dr. Jennette Kettle - Will obtain ABI, consider VVS consult if abnormal - Vancomycin (3/11-), ceftriaxone (3/7-) - CBC, BMP, Blood cultures Type 2 diabetes mellitus with ophthalmic complication (HCC) Fairly well-controlled, last A1c 6.7.  On basal insulin, no mealtime coverage at home.  Also taking metformin/Ozempic at home.  Given foot wound, would prefer tight glycemic control. - 34 units Lantus bedtime - CBG checks, add SSI if needed - Hold metformin/Ozempic Neuropathy due to type 2 diabetes mellitus (HCC) Significant chronic pain and neuropathy.  Home regimen includes morphine, nortriptyline, tizanidine. - Continue MS Contin 30 mg twice daily - Morphine MS IR tablet 30 mg at bedtime - Nortriptyline 50 mg daily - Tizanidine 2 mg daily as needed  Chronic and Stable Problems:  HTN: Soft blood pressure on admission, hold ACE inhibitor Depression/anxiety: Continue Paxil 40 mg, clonazepam 0.5-1.5 mg nightly PRN Hypertriglyceridemia: Continue atorvastatin 80 mg daily  FEN/GI: Regular diet VTE Prophylaxis: Lovenox  Disposition: MedSurg  History of Present  Illness:  [name] is a 53 y.o. female with past med history of type 2 diabetes (on insulin), neuropathy (on opioid pain control), and chronic foot wounds presenting for direct admission from our clinic with infection of foot wound.  Patient was admitted on 05/11/2023 with cellulitis.  Prior x-ray and MRI during this admission did not show osteomyelitis.  She was discharged home with linezolid, however had side effect of SOB and was transition to doxycycline, which gave her GERD and she stopped taking this medication.  She denies fevers, chills, night sweats, nausea vomiting diarrhea, cough, chest pain or SOB.  She does not have any pain 2/2 neuropathy.  She returned to our clinic on 05/31/2023 when she noticed her skin was red/warm and skin was peeling with ulcerations.   Her neuropathy regimen includes MS Contin twice daily as well as short acting MS IR at bedtime.  She also takes Zanaflex 4 muscle spasms/pain.  For anxiety and depression she takes Paxil with Klonopin.  Reviewed her most recent PM&R note, which confirms this regimen.  Additionally regimen was confirmed on medication reconciliation.  Review Of Systems: Per HPI  Pertinent Past Medical History: Type 2 diabetes Depression Diabetic peripheral neuropathy Hypertension  Remainder reviewed in history tab.   Pertinent Past Surgical History:  Remainder reviewed in history tab.   Pertinent Social History: Tobacco use: No Alcohol use: None Other Substance use: Denies Lives with Daughter  Pertinent Family History:  Reviewed in history tab.   Important Outpatient Medications: Lantus 34 units daily Aspirin 81 mg daily Klonopin 0.5-1.5 mg nightly PRN Enalapril 10 mg tablet Linzess 290 mcg capsule Metformin 1000 mg daily MS Contin 30 mg twice daily Morphine (MS IR) 30 mg tablet at night Nortriptyline 50 mg tablet  at bedtime Paroxetine 40 mg tablet daily 2 mg Ozempic weekly Zanaflex 2 mg tablet as needed  Remainder  reviewed in medication history.   Objective: BP 116/61 (BP Location: Left Arm)   Pulse (!) 101   Temp 98.4 F (36.9 C) (Oral)   Resp 18   Ht 6' (1.829 m)   Wt 118.7 kg   LMP 05/06/2018   SpO2 98%   BMI 35.48 kg/m  Exam: General: NAD, pleasant, resting comfortably in bed Eyes: EOM intact bilaterally, normal sclera Cardiovascular: RRR, no murmurs Respiratory: CTAB, normal wheezing on room air Gastrointestinal: Soft, nontender, distended. Left foot: (See pictures in media tab).  Erythematous from midfoot to toes, warm to touch, no TTP (2/2 neuropathy), +2 DP Neuro: Alert and active x 4  Labs:  Pending labs  Imaging Studies Performed:  No imaging performed.  Vanessa Kocher, DO 05/31/2023, 5:37 PM PGY-2, [facility]  FPTS Intern pager: [phone], text pages welcome Secure chat group [facility] Teaching Service

## 2023-05-31 NOTE — Assessment & Plan Note (Signed)
 Area of cellulitis appears worse on my exam today compared to photos obtained in the hospital during her recent admission.  As she has been off of all the oral antimicrobials for quite some time, I do worry that this could represent spreading/worsening of her infection.  She is overall well-appearing and hemodynamically stable.  Ultimately needs admission for IV antibiotics and imaging.  Is appropriate for direct admission to bypass the ER. -Med-Surg Bed request submitted, Dr. Denny Levy attending -Inpatient team aware -Have sent Bactrim to pharmacy in case it is more than 12-24 hours before bed is available

## 2023-05-31 NOTE — Progress Notes (Signed)
    SUBJECTIVE:   CHIEF COMPLAINT / HPI:   Diabetic Foot Wound Patient coming in for recurrence/worsening of her diabetic foot wound. Recently hospitalized for the same. XR and MR neg for osteo during that hospitalization. D/c on 2/17 and sent home on linezolid, did not tolerate linezolid and was transitioned to Doxycycline by Dr. Yetta Barre. Unfortunately also did not tolerate the doxycycline so has been off all antimicrobials until a few days ago when she noticed worsening and took a few doses of doxy.  She very much does not want to go to the ER. Would be amenable to direct admission for IV abx.   PERTINENT  PMH / PSH: Insulin dependent DM2, Chornic pain on morphine  OBJECTIVE:   BP (!) 140/64   Pulse 93   Ht 6' (1.829 m)   Wt 261 lb 9.6 oz (118.7 kg)   LMP 05/06/2018   SpO2 100%   BMI 35.48 kg/m   Gen: Well appearing overall, NAD Pulm: Normal WOB on RA, speaking in full sentences Ext: LLE with marked erythema, edema, and warmth to the foot and involving the lower shin.  There is ulceration to the lateral aspect of the great toe and sloughing of the skin across multiple toes. She has 1+ DP pulses in the bilateral feet.   POCUS performed with minimal cobblestoning of the soft tissues. Limited ultrasound obtained for learning purposes. Formal testing and follow up as below. Discussed limitations with patient.        ASSESSMENT/PLAN:   Assessment & Plan Diabetic foot infection (HCC) Area of cellulitis appears worse on my exam today compared to photos obtained in the hospital during her recent admission.  As she has been off of all the oral antimicrobials for quite some time, I do worry that this could represent spreading/worsening of her infection.  She is overall well-appearing and hemodynamically stable.  Ultimately needs admission for IV antibiotics and imaging.  Is appropriate for direct admission to bypass the ER. -Med-Surg Bed request submitted, Dr. Denny Levy  attending -Inpatient team aware -Have sent Bactrim to pharmacy in case it is more than 12-24 hours before bed is available    J Dorothyann Gibbs, MD [facility]

## 2023-05-31 NOTE — Assessment & Plan Note (Signed)
 Significant chronic pain and neuropathy.  Home regimen includes morphine, nortriptyline, tizanidine. - Continue MS Contin 30 mg twice daily - Morphine MS IR tablet 30 mg at bedtime - Nortriptyline 50 mg daily - Tizanidine 2 mg daily as needed

## 2023-05-31 NOTE — Telephone Encounter (Signed)
 Pharmacy Patient Advocate Encounter  Received notification from [facility] that Prior Authorization for Movantik 12.5mg  tablets has been DENIED.  Full denial letter will be uploaded to the media tab. See denial reason below.  You have failed one preferred drug as confirmed by claims history or submission of medical records. The preferred drugs: Amitiza Capsule, lubiprostone capsule (generic for Amitiza). (2) You cannot use one preferred drug (please specify contraindication or intolerance). The information provided does not show that you meet the criteria listed above.   PA #/Case ID/Reference #: [id number]

## 2023-05-31 NOTE — Progress Notes (Signed)
 Pharmacy Antibiotic Note  [name] is a 53 y.o. female admitted on 05/31/2023 with diabetic foot ulcer/cellulitis. Patient did not tolerate linezolid or doxycycline outpatient.   Pharmacy has been consulted for vancomycin dosing.  3/7 Vancomycin 1250mg  Q 12 hr with Est AUC: 523 Scr used: 0.8 mg/dL; Vd coeff: 0.5 L/kg  Plan: Vancomycin 2000mg  x1 then 1250mg  q12hr Monitor cultures, clinical status, renal function, vancomycin level Narrow abx as able and f/u duration     Height: 6' (182.9 cm) Weight: 118.7 kg (261 lb 9.6 oz) IBW/kg (Calculated) : 73.1  Temp (24hrs), Avg:98.4 F (36.9 C), Min:98.4 F (36.9 C), Max:98.4 F (36.9 C)  No results for input(s): "WBC", "CREATININE", "LATICACIDVEN", "VANCOTROUGH", "VANCOPEAK", "VANCORANDOM", "GENTTROUGH", "GENTPEAK", "GENTRANDOM", "TOBRATROUGH", "TOBRAPEAK", "TOBRARND", "AMIKACINPEAK", "AMIKACINTROU", "AMIKACIN" in the last 168 hours.  Estimated Creatinine Clearance: 114.5 mL/min (by C-G formula based on SCr of 0.49 mg/dL).    Allergies  Allergen Reactions   Vancomycin Other (See Comments)    Red Man's syndrome    Antimicrobials this admission: vanc 3/7 >>    Dose adjustments this admission: N/a  Microbiology results: 3/7 BCx:    Thank you for allowing pharmacy to be a part of this patient's care.  Alphia Moh, PharmD, BCPS, BCCP Clinical Pharmacist  Please check AMION for all [facility] Pharmacy phone numbers After 10:00 PM, call Main Pharmacy [phone] 5:29 PM

## 2023-05-31 NOTE — Assessment & Plan Note (Signed)
 Fairly well-controlled, last A1c 6.7.  On basal insulin, no mealtime coverage at home.  Also taking metformin/Ozempic at home.  Given foot wound, would prefer tight glycemic control. - 34 units Lantus bedtime - CBG checks, add SSI if needed - Hold metformin/Ozempic

## 2023-05-31 NOTE — Plan of Care (Signed)
 Called VVS for consult concerning dusky L foot in setting of diabetic foot ulcer.  Call returned, discussed with on call physician.  After discussion, physician recommended ABI of LLE, then call back VVS to see patient if abnormal.  Otherwise, no intervention planned given palpable pulses despite dusky foot.  No ABI in past year, though 5 ordered and not completed.  Last ABI completed appears to be in 2020, no evidence of LE arterial disease.

## 2023-05-31 NOTE — Hospital Course (Addendum)
 [name] is a 53 y.o. female with history of T2DM, Neuropathy, Chronic pain who was admitted for infected diabetic foot wound.  Diabetic Foot Wound Previously admitted on 05/11/2023 for cellulitis of left foot, received IV antibiotics and discharged home on oral antibiotic.  Patient failed linezolid/doxycycline as an outpatient (due to side effects and discontinuation rather than susceptibilities).  Returned on 05/31/2023 to our clinic, and was direct admitted to our service for IV antibiotics.  ABIs collected and were within normal limits.  MRI of the foot resulted ***.  She had no evidence of systemic infection and blood cultures***.  She was already on vancomycin and ceftriaxone and transition to***for total course of***.  Other chronic conditions were medically managed with home medications and formulary alternatives as necessary (diabetes, chronic pain, hypertension, anxiety, depression, hypertriglyceridemia)  Follow-up recommendations Ensure adherence to oral antibiotic regimen

## 2023-05-31 NOTE — Assessment & Plan Note (Signed)
 Chronic diabetic foot wound on left foot, now with infection. Not painful 2/2 neuropathy. No systemic symptoms or signs of sepsis.  Patient with +2 DP pulses bilaterally. - Admit to FMTS, Med-Surg, attending Dr. Jennette Kettle - Will obtain ABI, consider VVS consult if abnormal - Vancomycin (3/11-), ceftriaxone (3/7-) - CBC, BMP, Blood cultures

## 2023-05-31 NOTE — Telephone Encounter (Signed)
 Started hear this, sounds like they require her to fail lubiprostone/Amitiza before approving this. Can you let the patient know, we can try lubiprostone 24 mcg twice daily for 1 month trial. If no benefit after few weeks she should let us know we can try to resubmit for Movantik. Thank you very much.

## 2023-05-31 NOTE — Patient Instructions (Signed)
 [name],  We need to admit you to the hospital for IV antibiotics and more imaging of this foot. The hospital will call you as soon as a bed is available. I am sending bactrim to your pharmacy in case this takes more than 12-24 hours.   Eliezer Mccoy, MD

## 2023-05-31 NOTE — Plan of Care (Signed)
 FMTS Brief Progress Note  S: Evaluated patient at bedside with Dr. Velna Ochs.  No concerns or complaints.  States she feels like her foot looks a little bit better than it did earlier.  Denies pain, states she does not have feeling in her feet at baseline.    O: BP (!) 109/54 (BP Location: Left Arm)   Pulse 98   Temp 97.9 F (36.6 C) (Oral)   Resp 18   Ht 6' (1.829 m)   Wt 118.7 kg   LMP 05/06/2018   SpO2 97%   BMI 35.48 kg/m   Gen: well appearing, no acute distress. No signs of flushing or rash to face Ext: 1+ DP and PT pulses in LLE. Warmth to first three toes, see photo below   A/P: Cellulitis of L foot - Continue Ceftriaxone 2g q24h - Continue Vancomycin 1250mg  q12h - Hx vancomycin flushing syndrome, will monitor for any developing skin changes - Blood cx pending - ABI LLE pending  Rest of plan per day team  - Orders reviewed. Labs for AM ordered, which was adjusted as needed.   Para March, DO 05/31/2023, 8:47 PM PGY-1, [facility] Night Resident  Please page [phone] with questions.

## 2023-06-01 ENCOUNTER — Inpatient Hospital Stay (HOSPITAL_COMMUNITY)

## 2023-06-01 DIAGNOSIS — L97509 Non-pressure chronic ulcer of other part of unspecified foot with unspecified severity: Secondary | ICD-10-CM | POA: Diagnosis not present

## 2023-06-01 DIAGNOSIS — E11628 Type 2 diabetes mellitus with other skin complications: Principal | ICD-10-CM

## 2023-06-01 DIAGNOSIS — E11319 Type 2 diabetes mellitus with unspecified diabetic retinopathy without macular edema: Secondary | ICD-10-CM | POA: Diagnosis not present

## 2023-06-01 DIAGNOSIS — L03119 Cellulitis of unspecified part of limb: Secondary | ICD-10-CM | POA: Diagnosis not present

## 2023-06-01 DIAGNOSIS — L089 Local infection of the skin and subcutaneous tissue, unspecified: Secondary | ICD-10-CM | POA: Diagnosis not present

## 2023-06-01 DIAGNOSIS — E1140 Type 2 diabetes mellitus with diabetic neuropathy, unspecified: Secondary | ICD-10-CM | POA: Diagnosis not present

## 2023-06-01 DIAGNOSIS — R600 Localized edema: Secondary | ICD-10-CM | POA: Diagnosis not present

## 2023-06-01 DIAGNOSIS — M19072 Primary osteoarthritis, left ankle and foot: Secondary | ICD-10-CM | POA: Diagnosis not present

## 2023-06-01 LAB — CBC WITH DIFFERENTIAL/PLATELET
Abs Immature Granulocytes: 0.02 10*3/uL (ref 0.00–0.07)
Basophils Absolute: 0 10*3/uL (ref 0.0–0.1)
Basophils Relative: 1 %
Eosinophils Absolute: 0.3 10*3/uL (ref 0.0–0.5)
Eosinophils Relative: 4 %
HCT: 39 % (ref 36.0–46.0)
Hemoglobin: 12.5 g/dL (ref 12.0–15.0)
Immature Granulocytes: 0 %
Lymphocytes Relative: 54 %
Lymphs Abs: 4.1 10*3/uL — ABNORMAL HIGH (ref 0.7–4.0)
MCH: 27.5 pg (ref 26.0–34.0)
MCHC: 32.1 g/dL (ref 30.0–36.0)
MCV: 85.7 fL (ref 80.0–100.0)
Monocytes Absolute: 0.5 10*3/uL (ref 0.1–1.0)
Monocytes Relative: 7 %
Neutro Abs: 2.5 10*3/uL (ref 1.7–7.7)
Neutrophils Relative %: 34 %
Platelets: 302 10*3/uL (ref 150–400)
RBC: 4.55 MIL/uL (ref 3.87–5.11)
RDW: 14.6 % (ref 11.5–15.5)
WBC: 7.5 10*3/uL (ref 4.0–10.5)
nRBC: 0 % (ref 0.0–0.2)

## 2023-06-01 LAB — GLUCOSE, CAPILLARY
Glucose-Capillary: 89 mg/dL (ref 70–99)
Glucose-Capillary: 114 mg/dL — ABNORMAL HIGH (ref 70–99)
Glucose-Capillary: 150 mg/dL — ABNORMAL HIGH (ref 70–99)
Glucose-Capillary: 192 mg/dL — ABNORMAL HIGH (ref 70–99)

## 2023-06-01 NOTE — Progress Notes (Signed)
 VASCULAR LAB    Spoke with charge nurse Misty Stanley regarding >30 mmHg difference in systolic blood pressures on ABI and difference in waveforms from right to left brachial (right arm 119 systolic, left arm 84 systolic at time of exam) and asked if blood pressures of both arms could be taken to confirm difference, and if confirmed, recommended placing pink band on left arm in order to obtain accurate pressures for best patient care.      Chanon Loney, RVT 06/01/2023, 8:52 PM

## 2023-06-01 NOTE — Assessment & Plan Note (Addendum)
 Severe peripheral neuropathy, sees PM&R outpatient. Home pain regimen includes morphine, nortriptyline, tizanidine. - Continue MS Contin 30 mg twice daily - Morphine MS IR tablet 30 mg at bedtime - Nortriptyline 50 mg daily - Tizanidine 2 mg daily as needed

## 2023-06-01 NOTE — Assessment & Plan Note (Addendum)
 Recently hospitalized 2/15-2/17 for infection of diabetic foot wound with MRI negative for osteomyelitis. Discharged with Linezolid, did not tolerate it so was switched to Doxycycline 2/21, which she also did not tolerate so it was stopped. Reassuringly no signs of systemic involvement at this time. - Continue IV Vancomycin, Ceftriaxone - ABI ordered - Blood cx pending - Caution for adverse reaction to vancomycin - am CBC, BMP

## 2023-06-01 NOTE — Progress Notes (Signed)
     Daily Progress Note Intern Pager: [phone]  Patient name: [name] Medical record number: [id number] Date of birth: [birth year] Age: 53 y.o. Gender: female  Primary Care Provider: Erick Alley, DO Consultants: None Code Status: Full  Pt Overview and Major Events to Date:  05/31/23 - admitted  Assessment and Plan:  53yo female with PMHx insulin dependent T2DM, peripheral neuropathy, HTN, HLD presenting with infection of chronic diabetic foot wound. Continue IV abx and obtain ABI today. Assessment & Plan Infected wound Recently hospitalized 2/15-2/17 for infection of diabetic foot wound with MRI negative for osteomyelitis. Discharged with Linezolid, did not tolerate it so was switched to Doxycycline 2/21, which she also did not tolerate so it was stopped. Reassuringly no signs of systemic involvement at this time. - Continue IV Vancomycin, Ceftriaxone - ABI ordered - Blood cx pending - Caution for adverse reaction to vancomycin - am CBC, BMP Type 2 diabetes mellitus with ophthalmic complication (HCC) Well-controlled, last A1c 6.7. Home regimen includes Ozempic, Metformin, Lantus 34U daily - Sensitive SSI  - 34 units Lantus bedtime - CBG before meals and at bedtime Neuropathy due to type 2 diabetes mellitus (HCC) Severe peripheral neuropathy, sees PM&R outpatient. Home pain regimen includes morphine, nortriptyline, tizanidine. - Continue MS Contin 30 mg twice daily - Morphine MS IR tablet 30 mg at bedtime - Nortriptyline 50 mg daily - Tizanidine 2 mg daily as needed  Chronic and Stable Problems:  Depression/anxiety - contine Klonopin 1.5mg  at bedtime prn, Paxil 40mg  qhs HLD - continue Lipitor 80mg  qd  FEN/GI: Regular diet PPx: Lovenox Dispo:Home pending clinical improvement    Subjective:  NAEON. No complaints this morning. Has not had any adverse reactions to her antibiotics.   Objective: Temp:  [97.8 F (36.6 C)-98.4 F (36.9 C)] 97.8 F (36.6 C) (03/08  0416) Pulse Rate:  [78-101] 78 (03/08 0416) Resp:  [18] 18 (03/08 0416) BP: (99-140)/(52-64) 99/52 (03/08 0416) SpO2:  [97 %-100 %] 97 % (03/08 0416) Weight:  [118.7 kg] 118.7 kg (03/07 1637) Physical Exam: General: well appearing, NAD, sleeping comfortably Cardiovascular: well perfused Respiratory: normal work of breathing on RA Extremities:   Laboratory: Most recent CBC Lab Results  Component Value Date   WBC 10.0 05/31/2023   HGB 13.9 05/31/2023   HCT 42.9 05/31/2023   MCV 85.3 05/31/2023   PLT 350 05/31/2023   Most recent BMP    Latest Ref Rng & Units 05/31/2023    5:42 PM  BMP  Glucose 70 - 99 mg/dL 147   BUN 6 - 20 mg/dL 20   Creatinine 8.29 - 1.00 mg/dL 5.62   Sodium 130 - 865 mmol/L 137   Potassium 3.5 - 5.1 mmol/L 4.0   Chloride 98 - 111 mmol/L 101   CO2 22 - 32 mmol/L 24   Calcium 8.9 - 10.3 mg/dL 78.4    Nohea Kras, DO 06/01/2023, 6:34 AM  PGY-1, [facility] FPTS Intern pager: [phone], text pages welcome Secure chat group [facility] Teaching Service

## 2023-06-01 NOTE — Progress Notes (Signed)
 VASCULAR LAB    ABI has been performed.  See CV proc for preliminary results.   Shaaron Golliday, RVT 06/01/2023, 1:27 PM

## 2023-06-01 NOTE — Plan of Care (Signed)

## 2023-06-01 NOTE — Consult Note (Signed)
 WOC Nurse Consult Note: Reason for Consult: Breakdown to left foot , first, second and third digits, in the setting of diabetes and neuropathy.   Peeling epithelium to second and third digits.  ABI normal.  MD recommending another MRI to assess for osteomyelitis.   Wound type: infectious, neuropathic Pressure Injury POA: NA Measurement: see flow sheets, circular nonintact lesions to interdigital spaces.  Wound bed: fibrin Drainage (amount, consistency, odor) minimal serosanguinous  Periwound:edema and erythema to foot and toes, pallor.  Normal ABI Dressing procedure/placement/frequency: Cleanse toes and toe lesions with VASHE (LAWSON # [id number]) Weave Aquacel between toes (cut into spiral or strips) cover with dry gauze and kerlix/tape.  Change daily.  Will not follow at this time.  Please re-consult if needed.  Mike Gip MSN, RN, FNP-BC CWON Wound, Ostomy, Continence Nurse Outpatient [facility] [phone] Pager [phone]

## 2023-06-01 NOTE — Assessment & Plan Note (Addendum)
 Well-controlled, last A1c 6.7. Home regimen includes Ozempic, Metformin, Lantus 34U daily - Sensitive SSI  - 34 units Lantus bedtime - CBG before meals and at bedtime

## 2023-06-02 DIAGNOSIS — E11319 Type 2 diabetes mellitus with unspecified diabetic retinopathy without macular edema: Secondary | ICD-10-CM | POA: Diagnosis not present

## 2023-06-02 DIAGNOSIS — L03119 Cellulitis of unspecified part of limb: Secondary | ICD-10-CM | POA: Diagnosis not present

## 2023-06-02 DIAGNOSIS — L089 Local infection of the skin and subcutaneous tissue, unspecified: Secondary | ICD-10-CM | POA: Diagnosis not present

## 2023-06-02 DIAGNOSIS — E11628 Type 2 diabetes mellitus with other skin complications: Secondary | ICD-10-CM | POA: Diagnosis not present

## 2023-06-02 DIAGNOSIS — E1140 Type 2 diabetes mellitus with diabetic neuropathy, unspecified: Secondary | ICD-10-CM

## 2023-06-02 LAB — GLUCOSE, CAPILLARY
Glucose-Capillary: 107 mg/dL — ABNORMAL HIGH (ref 70–99)
Glucose-Capillary: 149 mg/dL — ABNORMAL HIGH (ref 70–99)
Glucose-Capillary: 180 mg/dL — ABNORMAL HIGH (ref 70–99)
Glucose-Capillary: 166 mg/dL — ABNORMAL HIGH (ref 70–99)

## 2023-06-02 LAB — VAS US ABI WITH/WO TBI
Left ABI: 1.14
Right ABI: 1.04

## 2023-06-02 MED ORDER — SENNA 8.6 MG PO TABS
1.0000 | ORAL_TABLET | Freq: Every day | ORAL | Status: DC
Start: 2023-06-02 — End: 2023-06-03
  Administered 2023-06-02 – 2023-06-03 (×2): 8.6 mg via ORAL
  Filled 2023-06-02 (×2): qty 1

## 2023-06-02 MED ORDER — POLYETHYLENE GLYCOL 3350 17 G PO PACK
17.0000 g | PACK | Freq: Every day | ORAL | Status: DC
Start: 2023-06-02 — End: 2023-06-03
  Administered 2023-06-02 – 2023-06-03 (×2): 17 g via ORAL
  Filled 2023-06-02 (×2): qty 1

## 2023-06-02 NOTE — Assessment & Plan Note (Signed)
 Well-controlled, last A1c 6.7. Home regimen includes Ozempic, Metformin, Lantus 34U daily - Sensitive SSI, only 1 unit aspart given yesterday for correction - 34 units Lantus bedtime - CBG before meals and at bedtime

## 2023-06-02 NOTE — Progress Notes (Signed)
 Daily Progress Note Intern Pager: [phone]  Patient name: [name] Medical record number: [id number] Date of birth: [birth year] Age: 53 y.o. Gender: female  Primary Care Provider: Erick Alley, DO Consultants: None Code Status: Full code  Pt Overview and Major Events to Date:  3/7: Admitted  Assessment and Plan:  [name] is a 53 y.o. female presenting with cellulitis of the left forefoot and toes. Pertinent PMH/PSH includes insulin-dependent type 2 diabetes, peripheral neuropathy, hypertension, hyperlipidemia.   ABI resulted normal.  MRI foot pending.  Patient is interesting in discharging home today if MRI shows only superficial infection. Assessment & Plan Infected wound Remains afebrile.  ABIs normal.  MRI foot pending.  Recently hospitalized 2/15-2/17 for infection of diabetic foot wound with MRI negative for osteomyelitis. Discharged with Linezolid, did not tolerate it so was switched to Doxycycline 2/21, which she also did not tolerate so it was stopped. Reassuringly no signs of systemic involvement at this time. - Continue IV Vancomycin, Ceftriaxone - Blood cx pending - Caution for adverse reaction to vancomycin - am CBC, BMP pending - Wound care to see Type 2 diabetes mellitus with ophthalmic complication (HCC) Well-controlled, last A1c 6.7. Home regimen includes Ozempic, Metformin, Lantus 34U daily - Sensitive SSI, only 1 unit aspart given yesterday for correction - 34 units Lantus bedtime - CBG before meals and at bedtime Neuropathy due to type 2 diabetes mellitus (HCC) Only 1 dose Zanaflex required yesterday.  Severe peripheral neuropathy, sees PM&R outpatient. Home pain regimen includes morphine, nortriptyline, tizanidine. - Continue MS Contin 30 mg twice daily - Morphine MS IR tablet 30 mg at bedtime - Nortriptyline 50 mg daily - Tizanidine 2 mg daily as needed   Chronic and Stable Problems:  Depression/anxiety: Continue Klonopin 1.5 mg  at bedtime as needed, Paxil 40 mg at bedtime HLD: Continue Lipitor 80 mg at night   FEN/GI: Regular diet PPx: Lovenox Dispo:Home pending clinical improvement . Barriers include ongoing medical workup.   Subjective:  Resting comfortably.  No acute complaints.  She is tolerating IV antibiotics.  Interested in going home today if MRI of the foot is normal.  Objective: Temp:  [97.8 F (36.6 C)-98.1 F (36.7 C)] 98 F (36.7 C) (03/09 0419) Pulse Rate:  [73-89] 80 (03/09 0419) Resp:  [17-18] 18 (03/09 0419) BP: (98-109)/(51-69) 109/64 (03/09 0419) SpO2:  [98 %-100 %] 98 % (03/09 0419) Physical Exam: General: Well-appearing no acute distress Cardiovascular: Regular rate regular rhythm, no murmurs on exam  Respiratory: Clear, no wheezing, no increased work of breathing Abdomen: Soft nontender nondistended Extremities: Infected third digit on the left foot, decreased sensation secondary to neuropathy  Laboratory: Most recent CBC Lab Results  Component Value Date   WBC 7.5 06/01/2023   HGB 12.5 06/01/2023   HCT 39.0 06/01/2023   MCV 85.7 06/01/2023   PLT 302 06/01/2023   Most recent BMP    Latest Ref Rng & Units 05/31/2023    5:42 PM  BMP  Glucose 70 - 99 mg/dL 308   BUN 6 - 20 mg/dL 20   Creatinine 6.57 - 1.00 mg/dL 8.46   Sodium 962 - 952 mmol/L 137   Potassium 3.5 - 5.1 mmol/L 4.0   Chloride 98 - 111 mmol/L 101   CO2 22 - 32 mmol/L 24   Calcium 8.9 - 10.3 mg/dL 84.1     Imaging/Diagnostic Tests: ABI within normal limits bilaterally MRI foot pending  Glendale Chard, DO 06/02/2023, 5:19 AM  PGY-2,  [facility] FPTS Intern pager: [phone], text pages welcome Secure chat group [facility] Teaching Service

## 2023-06-02 NOTE — Assessment & Plan Note (Signed)
 Only 1 dose Zanaflex required yesterday.  Severe peripheral neuropathy, sees PM&R outpatient. Home pain regimen includes morphine, nortriptyline, tizanidine. - Continue MS Contin 30 mg twice daily - Morphine MS IR tablet 30 mg at bedtime - Nortriptyline 50 mg daily - Tizanidine 2 mg daily as needed

## 2023-06-02 NOTE — Assessment & Plan Note (Addendum)
 Remains afebrile.  ABIs normal.  MRI foot pending.  Recently hospitalized 2/15-2/17 for infection of diabetic foot wound with MRI negative for osteomyelitis. Discharged with Linezolid, did not tolerate it so was switched to Doxycycline 2/21, which she also did not tolerate so it was stopped. Reassuringly no signs of systemic involvement at this time. - Continue IV Vancomycin, Ceftriaxone - Blood cx pending - Caution for adverse reaction to vancomycin - am CBC, BMP pending - Wound care to see

## 2023-06-03 ENCOUNTER — Other Ambulatory Visit (HOSPITAL_COMMUNITY): Payer: Self-pay

## 2023-06-03 DIAGNOSIS — E11628 Type 2 diabetes mellitus with other skin complications: Secondary | ICD-10-CM | POA: Diagnosis not present

## 2023-06-03 DIAGNOSIS — E11319 Type 2 diabetes mellitus with unspecified diabetic retinopathy without macular edema: Secondary | ICD-10-CM | POA: Diagnosis not present

## 2023-06-03 DIAGNOSIS — L089 Local infection of the skin and subcutaneous tissue, unspecified: Secondary | ICD-10-CM | POA: Diagnosis not present

## 2023-06-03 DIAGNOSIS — L03119 Cellulitis of unspecified part of limb: Secondary | ICD-10-CM | POA: Diagnosis not present

## 2023-06-03 DIAGNOSIS — E1140 Type 2 diabetes mellitus with diabetic neuropathy, unspecified: Secondary | ICD-10-CM | POA: Diagnosis not present

## 2023-06-03 LAB — GLUCOSE, CAPILLARY
Glucose-Capillary: 101 mg/dL — ABNORMAL HIGH (ref 70–99)
Glucose-Capillary: 186 mg/dL — ABNORMAL HIGH (ref 70–99)

## 2023-06-03 MED ORDER — SULFAMETHOXAZOLE-TRIMETHOPRIM 800-160 MG PO TABS: 1.0000 | ORAL_TABLET | Freq: Two times a day (BID) | ORAL | Status: DC

## 2023-06-03 MED ORDER — SULFAMETHOXAZOLE-TRIMETHOPRIM 800-160 MG PO TABS
1.0000 | ORAL_TABLET | Freq: Two times a day (BID) | ORAL | 0 refills | Status: DC
  Filled 2023-06-03: qty 5, 3d supply, fill #0

## 2023-06-03 MED ORDER — SULFAMETHOXAZOLE-TRIMETHOPRIM 800-160 MG PO TABS
1.0000 | ORAL_TABLET | Freq: Two times a day (BID) | ORAL | Status: DC
  Administered 2023-06-03: 1 via ORAL
  Filled 2023-06-03: qty 1

## 2023-06-03 MED ORDER — LUBIPROSTONE 24 MCG PO CAPS
24.0000 ug | ORAL_CAPSULE | Freq: Two times a day (BID) | ORAL | 0 refills | Status: DC
Start: 2023-06-03 — End: 2023-10-17

## 2023-06-03 NOTE — Assessment & Plan Note (Addendum)
 History of Severe peripheral neuropathy, sees PM&R outpatient. Home pain regimen includes morphine, nortriptyline, tizanidine.  Required 1 dose of tizanidine overnight - Continue MS Contin 30 mg twice daily - Morphine MS IR tablet 30 mg at bedtime, and twice daily as needed - Nortriptyline 50 mg daily - Tizanidine 2 mg daily as needed - Continue senna and MiraLAX for constipation

## 2023-06-03 NOTE — Plan of Care (Signed)
 Called radiology to follow up on MRI read, was told that they do not have [facility] radiology on the weekend and that it should be read at 8am this morning.

## 2023-06-03 NOTE — Addendum Note (Signed)
 Addended by: Cooper Render on: 06/03/2023 09:21 AM   Modules accepted: Orders

## 2023-06-03 NOTE — Assessment & Plan Note (Addendum)
 Well-controlled, last A1c 6.7. Home regimen includes Ozempic, Metformin, Lantus 34U daily.  CBGs have been controlled while admitted. - Sensitive SSI - 34 units Lantus bedtime - CBG before meals and at bedtime

## 2023-06-03 NOTE — Telephone Encounter (Signed)
 Trial of Amitiza sent to pharmacy.  MyChart message sent to patient.

## 2023-06-03 NOTE — Discharge Summary (Signed)
 Family Medicine Teaching [facility] Discharge Summary  Patient name: [name] Medical record number: [id number] Date of birth: [birth year] Age: 53 y.o. Gender: female Date of Admission: 05/31/2023  Date of Discharge: 06/03/23 Admitting Physician: Tiffany Kocher, DO  Primary Care Provider: Erick Alley, DO Consultants: none  Indication for Hospitalization: Cellulitis of L foot  Discharge Diagnoses/Problem List:  Principal Problem for Admission: Cellulitis  Other Problems addressed during stay:  Principal Problem:   Infected wound Active Problems:   Neuropathy due to type 2 diabetes mellitus (HCC)   Type 2 diabetes mellitus with ophthalmic complication (HCC)   Cellulitis of foot associated with diabetes mellitus [facility])    Brief Hospital Course:  [name] is a 53 y.o. female with history of T2DM, Neuropathy, Chronic pain who was admitted for infected diabetic foot wound.  Diabetic Foot Wound Previously admitted on 05/11/2023 for cellulitis of left foot, received IV antibiotics and discharged home on oral antibiotic.  Patient failed linezolid/doxycycline as an outpatient (due to side effects and discontinuation rather than susceptibilities).  Returned on 05/31/2023 to our clinic, and was direct admitted to our service for IV antibiotics.  ABIs collected and were within normal limits for BLE.  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  She had no evidence of systemic infection and blood cultures showed no growth x3d.  She was already on vancomycin and ceftriaxone and transition to Bactrim for total course of 5 days.  Other chronic conditions were medically managed with home medications and formulary alternatives as necessary (diabetes, chronic pain, hypertension, anxiety, depression, hypertriglyceridemia)  Follow-up recommendations Ensure adherence to oral antibiotic regimen There is a >20 mmHg  difference in blood pressures from  right to left arm found on ABI - consider further evaluation   Disposition: Home  Discharge Condition: Stable  Discharge Exam:  Vitals:   06/03/23 0441 06/03/23 0911  BP: (!) 105/55 (!) 104/53  Pulse: 77 77  Resp: 20 18  Temp: 97.8 F (36.6 C) 97.6 F (36.4 C)  SpO2: 98% 98%   Left foot day of discharge:      Significant Procedures:  ABI 06/01/23 Bilateral ABIs and TBIs appear essentially unchanged compared to prior  study on 05/28/18. There is a >20 mmHg difference in blood pressures from  right to left arm. The right brachial waveform is triphasic and the left  brachial waveform is biphasic.    Summary:  Right: Resting right ankle-brachial index is within normal range. The  right toe-brachial index is normal.   Left: Resting left ankle-brachial index is within normal range. The left  toe-brachial index is normal.    Significant Labs and Imaging:  No results for input(s): "WBC", "HGB", "HCT", "PLT" in the last 48 hours. No results for input(s): "NA", "K", "CL", "CO2", "GLUCOSE", "BUN", "CREATININE", "CALCIUM", "MG", "PHOS", "ALKPHOS", "AST", "ALT", "ALBUMIN", "PROTEIN" in the last 48 hours.  MRI Left Foot WO Contrast IMPRESSION: 1. No findings of active osteomyelitis in the forefoot or toes. 2. Subcutaneous edema in the third toe and to a lesser extent in the first and second toes. Cellulitis not excluded. 3. 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 4. Mild degenerative spurring of the first MTP joint. 5. Degenerative subcortical cystic lesion at the base of the fourth metatarsal.  Results/Tests Pending at Time of Discharge: None  Discharge Medications:  Allergies as of 06/03/2023       Reactions   Linezolid Shortness Of Breath  Vancomycin Other (See Comments)   Red Man's syndrome   Doxycycline Hyclate Other (See Comments)   GERD        Medication List     TAKE these medications    Accu-Chek  Guide test strip Generic drug: glucose blood Use as instructed   Accu-Chek Softclix Lancet Dev Kit 1 applicator by Does not apply route in the morning, at noon, and at bedtime.   Accu-Chek Softclix Lancets lancets Use as instructed   aspirin 81 MG tablet Take 81 mg by mouth daily.   atorvastatin 80 MG tablet Commonly known as: LIPITOR TAKE 1 TABLET(80 MG) BY MOUTH DAILY   BD Pen Needle Nano 2nd Gen 32G X 4 MM Misc Generic drug: Insulin Pen Needle USE TO INJECT INSULIN DAILY Strength:32G X 4 MM   clonazePAM 0.5 MG tablet Commonly known as: KLONOPIN TAKE 3 TABLETS BY MOUTH AT BEDTIME AS NEEDED   enalapril 10 MG tablet Commonly known as: VASOTEC TAKE 1 TABLET BY MOUTH DAILY   fenofibrate 145 MG tablet Commonly known as: TRICOR TAKE 1 TABLET(145 MG) BY MOUTH DAILY   FreeStyle Libre 3 Plus Sensor Misc Change sensor every 15 days.   Lantus SoloStar 100 UNIT/ML Solostar Pen Generic drug: insulin glargine INJECT 34 UNITS INTO SKIN DAILY What changed: See the new instructions.   lubiprostone 24 MCG capsule Commonly known as: AMITIZA Take 1 capsule (24 mcg total) by mouth 2 (two) times daily with a meal.   metFORMIN 1000 MG tablet Commonly known as: GLUCOPHAGE TAKE 1 TABLET(1000 MG) BY MOUTH TWICE DAILY WITH A MEAL   morphine 30 MG 12 hr tablet Commonly known as: MS CONTIN Take 1 tablet (30 mg total) by mouth every 12 (twelve) hours.   morphine 30 MG tablet Commonly known as: MSIR One tablet at HS, may take a tablet during the day.   multivitamin tablet Take 1 tablet by mouth daily.   mupirocin ointment 2 % Commonly known as: BACTROBAN Apply 1 Application topically 2 (two) times daily. What changed:  when to take this reasons to take this   naloxegol oxalate 12.5 MG Tabs tablet Commonly known as: MOVANTIK Take 12.5 mg (1 tablet) by mouth once daily. After 3 to 5 days, if needed, you may increase to 25 mg (2 tablets) once daily   nortriptyline 50 MG  capsule Commonly known as: PAMELOR TAKE 1 CAPSULE(50 MG) BY MOUTH AT BEDTIME   Ozempic (2 MG/DOSE) 8 MG/3ML Sopn Generic drug: Semaglutide (2 MG/DOSE) Inject 2 mg into the skin once a week.   PARoxetine 40 MG tablet Commonly known as: PAXIL Take 1 tablet (40 mg total) by mouth every morning.   sulfamethoxazole-trimethoprim 800-160 MG tablet Commonly known as: BACTRIM DS Take 1 tablet by mouth every 12 (twelve) hours. What changed: when to take this   tiZANidine 2 MG tablet Commonly known as: ZANAFLEX TAKE 1 TABLET(2 MG) BY MOUTH THREE TIMES DAILY What changed: See the new instructions.   VITAMIN D (CHOLECALCIFEROL) PO Take 1 capsule by mouth daily.        Discharge Instructions: Please refer to Patient Instructions section of EMR for full details.  Patient was counseled important signs and symptoms that should prompt return to medical care, changes in medications, dietary instructions, activity restrictions, and follow up appointments.   Follow-Up Appointments: 3/14 - appt with Dr. Georg Ruddle hospital f/u  Douglass, [place], DO 06/03/2023, 1:48 PM PGY-1, [facility] Family Medicine

## 2023-06-03 NOTE — Plan of Care (Signed)
  Problem: Clinical Measurements: Goal: Diagnostic test results will improve Outcome: Not Progressing   Problem: Clinical Measurements: Goal: Will remain free from infection Outcome: Not Progressing   Problem: Skin Integrity: Goal: Risk for impaired skin integrity will decrease Outcome: Not Progressing   Problem: Tissue Perfusion: Goal: Adequacy of tissue perfusion will improve Outcome: Not Progressing   Problem: Metabolic: Goal: Ability to maintain appropriate glucose levels will improve Outcome: Not Progressing   Problem: Pain Managment: Goal: General experience of comfort will improve and/or be controlled Outcome: Not Progressing

## 2023-06-03 NOTE — Assessment & Plan Note (Addendum)
 Remains afebrile. Has had no reaction to vancomycin thus far.  ABIs of lower extremities normal, of note there was a >84mmHg difference in BP from R to L arm. MRI left foot negative for osteomyelitis.  - Transition to PO Bactrim today - Blood cx NG x3d - am CBC, BMP

## 2023-06-03 NOTE — Plan of Care (Signed)

## 2023-06-03 NOTE — Progress Notes (Signed)
 Daily Progress Note Intern Pager: [phone]  Patient name: [name] Medical record number: [id number] Date of birth: Jan 03, 1971 Age: 53 y.o. Gender: female  Primary Care Provider: Erick Alley, DO Consultants: None Code Status: Full  Pt Overview and Major Events to Date:  3/7-admitted  Assessment and Plan:  53 year old female with past medical history of insulin-dependent type 2 diabetes, peripheral neuropathy, hypertension, hyperlipidemia presenting with cellulitis of left foot in setting of diabetic foot wound. MRI negative for osteomyelitis. Transitioned to PO Bactrim today, potential discharge this afternoon.  Assessment & Plan Infected wound Remains afebrile. Has had no reaction to vancomycin thus far.  ABIs of lower extremities normal, of note there was a >50mmHg difference in BP from R to L arm. MRI left foot negative for osteomyelitis.  - Transition to PO Bactrim today - Blood cx NG x3d - am CBC, BMP Type 2 diabetes mellitus with ophthalmic complication (HCC) Well-controlled, last A1c 6.7. Home regimen includes Ozempic, Metformin, Lantus 34U daily.  CBGs have been controlled while admitted. - Sensitive SSI - 34 units Lantus bedtime - CBG before meals and at bedtime Neuropathy due to type 2 diabetes mellitus (HCC) History of Severe peripheral neuropathy, sees PM&R outpatient. Home pain regimen includes morphine, nortriptyline, tizanidine.  Required 1 dose of tizanidine overnight - Continue MS Contin 30 mg twice daily - Morphine MS IR tablet 30 mg at bedtime, and twice daily as needed - Nortriptyline 50 mg daily - Tizanidine 2 mg daily as needed - Continue senna and MiraLAX for constipation  Chronic and Stable Problems:  Depression and anxiety-continue Klonopin 1.5 mg at bedtime as needed, Paxil 40 mg daily at bedtime.  Has required 1 dose of Klonopin Hyperlipidemia-continue atorvastatin 80 mg daily   FEN/GI: Regular diet PPx: Lovenox Dispo:Home  pending clinical improvement .   Subjective:  NAEON. Pt really wants to go home, states she will leave with a PICC line if she has to  Objective: Temp:  [97.6 F (36.4 C)-98.7 F (37.1 C)] 97.6 F (36.4 C) (03/10 0911) Pulse Rate:  [77-87] 77 (03/10 0911) Resp:  [18-20] 18 (03/10 0911) BP: (104-118)/(53-62) 104/53 (03/10 0911) SpO2:  [98 %-99 %] 98 % (03/10 0911) Physical Exam: General: Well appearing, no acute distress Respiratory: Breathing comfortably on RA, speaking in complete sentences Extremities: Improved cellulitis, no longer having erythema to midfoot. Wounds beginning to scab      Laboratory: Most recent CBC Lab Results  Component Value Date   WBC 7.5 06/01/2023   HGB 12.5 06/01/2023   HCT 39.0 06/01/2023   MCV 85.7 06/01/2023   PLT 302 06/01/2023   Most recent BMP    Latest Ref Rng & Units 05/31/2023    5:42 PM  BMP  Glucose 70 - 99 mg/dL 130   BUN 6 - 20 mg/dL 20   Creatinine 8.65 - 1.00 mg/dL 7.84   Sodium 696 - 295 mmol/L 137   Potassium 3.5 - 5.1 mmol/L 4.0   Chloride 98 - 111 mmol/L 101   CO2 22 - 32 mmol/L 24   Calcium 8.9 - 10.3 mg/dL 28.4    Bcx NG X3K  Imaging/Diagnostic Tests: MRI Foot Left 06/01/23 IMPRESSION: 1. No findings of active osteomyelitis in the forefoot or toes. 2. Subcutaneous edema in the third toe and to a lesser extent in the first and second toes. Cellulitis not excluded. 3. 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 4. Mild degenerative spurring of the first MTP joint. 5. Degenerative subcortical cystic  lesion at the base of the fourth metatarsal.  Monea Pesantez, DO 06/03/2023, 10:30 AM  PGY-1, [facility] Family Medicine FPTS Intern pager: [phone], text pages welcome Secure chat group [facility] Teaching Service

## 2023-06-03 NOTE — Discharge Instructions (Signed)
 Dear [name],   Thank you for letting us participate in your care! You were admitted with a foot infection. We treated you with IV antibiotics. We are discharging you with an antibiotic called Bactrim that you will take for 2 more days.   POST-HOSPITAL & CARE INSTRUCTIONS We recommend following up with your PCP within 1 week from being discharged from the hospital. Please let PCP/Specialists know of any changes in medications that were made which you will be able to see in the medications section of this packet.  DOCTOR'S APPOINTMENTS & FOLLOW UP Future Appointments  Date Time Provider Department Center  06/05/2023  1:45 PM Louann Sjogren, DPM [facility]  06/12/2023  3:50 PM Adam Phenix, MD [facility] None  06/21/2023  1:30 PM Armbruster, Willaim Rayas, MD [facility]  06/25/2023  5:00 PM [facility] MM 3 [facility]  07/17/2023  3:20 PM Jones Bales, NP [facility] CPR     Thank you for choosing [facility]! Take care and be well!  Family Medicine Teaching Service Inpatient Team [place]  [facility]  [address] [place], [place] [address] [phone]

## 2023-06-05 ENCOUNTER — Ambulatory Visit: Payer: Medicaid Other | Admitting: Podiatry

## 2023-06-05 ENCOUNTER — Encounter: Payer: Self-pay | Admitting: Podiatry

## 2023-06-05 ENCOUNTER — Ambulatory Visit (INDEPENDENT_AMBULATORY_CARE_PROVIDER_SITE_OTHER): Admitting: Podiatry

## 2023-06-05 DIAGNOSIS — E1140 Type 2 diabetes mellitus with diabetic neuropathy, unspecified: Secondary | ICD-10-CM | POA: Diagnosis not present

## 2023-06-05 DIAGNOSIS — L97521 Non-pressure chronic ulcer of other part of left foot limited to breakdown of skin: Secondary | ICD-10-CM

## 2023-06-05 LAB — CULTURE, BLOOD (ROUTINE X 2)
Culture: NO GROWTH
Culture: NO GROWTH
Special Requests: ADEQUATE

## 2023-06-05 NOTE — Progress Notes (Signed)
  Subjective:  Patient ID: [name], female    DOB: 1970/06/28,   MRN: [id number]  No chief complaint on file.   53 y.o. female presents for new concern of wounds on her left foot. Relates they popped up as blood blisters about a  month ago.  She was lost to follow-up and has not been seen in about a year. She does relates she has been hospitalized twice for the toes recently and just got out. Was discharged with bactrim. MRI was negative for osteomyelitis and vascular studies wnl . c. Patient is diabetic and last A1c was  Lab Results  Component Value Date   HGBA1C 6.7 (H) 05/11/2023   .   PCP:  Erick Alley, DO   . Denies any other pedal complaints. Denies n/v/f/c.   Past Medical History:  Diagnosis Date   Anxiety    Cataract    DEPRESSION/ANXIETY 08/08/2009   Diabetes mellitus without complication (HCC)    Hyperlipidemia    IBS 02/02/2010   Neuropathy    Polyneuropathy 12/04/2010   Renal stones    Sleep apnea 05/2021   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) 08/08/2009    Objective:  Physical Exam: Vascular: DP/PT pulses 2/4 bilateral. CFT <3 seconds. Normal hair growth on digits. No edema.  Skin. No lacerations or abrasions bilateral feet. Lateral second and third digit superficial ulcerations noted with granular base and mild surrounding erythema. No purulence noted. Some abrasion noted to medial hallux as well on the left Musculoskeletal: MMT 5/5 bilateral lower extremities in DF, PF, Inversion and Eversion. Deceased ROM in DF of ankle joint.  Neurological: Sensation intact to light touch.   Assessment:   1. Neuropathy due to type 2 diabetes mellitus (HCC)   2. Skin ulcer of toe of left foot, limited to breakdown of skin (HCC)      Plan:  Patient was evaluated and treated and all questions answered. Ulcer left lateral second and third digits limited to breakdown of skin   -Debridement as below. -Dressed with betadine, DSD. -Off-loading with surgical shoe. Dispensed  -Finish out antibiotics  -Discussed glucose control and proper protein-rich diet.  -Discussed if any worsening redness, pain, fever or chills to call or may need to report to the emergency room. Patient expressed understanding.   Procedure: Excisional Debridement of Wound Rationale: Removal of non-viable soft tissue from the wound to promote healing.  Anesthesia: none Pre-Debridement Wound Measurements: Overlying slough and wound derbis  Post-Debridement Wound Measurements: 1 cm x 0.5 cm x 0.1 cm (lateral second digit) 1.2cm x 1.5 cm x 0.1 cm (lateral third digit)  Type of Debridement: Sharp Excisional Tissue Removed: Non-viable soft tissue Depth of Debridement: subcutaneous tissue. Technique: Sharp excisional debridement to bleeding, viable wound base.  Dressing: Dry, sterile, compression dressing. Disposition: Patient tolerated procedure well. Patient to return in 2 week for follow-up.  No follow-ups on file.   Louann Sjogren, DPM

## 2023-06-07 ENCOUNTER — Ambulatory Visit: Payer: Self-pay | Admitting: Family Medicine

## 2023-06-11 ENCOUNTER — Other Ambulatory Visit (HOSPITAL_COMMUNITY): Payer: Self-pay

## 2023-06-12 ENCOUNTER — Encounter: Payer: Self-pay | Admitting: Obstetrics & Gynecology

## 2023-06-12 ENCOUNTER — Ambulatory Visit: Payer: Medicaid Other | Admitting: Obstetrics & Gynecology

## 2023-06-13 ENCOUNTER — Other Ambulatory Visit: Payer: Self-pay | Admitting: Family Medicine

## 2023-06-13 ENCOUNTER — Encounter: Payer: Self-pay | Admitting: Student

## 2023-06-13 ENCOUNTER — Encounter: Payer: Self-pay | Admitting: Podiatry

## 2023-06-13 DIAGNOSIS — E118 Type 2 diabetes mellitus with unspecified complications: Secondary | ICD-10-CM

## 2023-06-13 NOTE — Telephone Encounter (Signed)
 Called patient to discuss concern further. She did not answer, LVM asking her to return call to office.   Veronda Prude, RN

## 2023-06-14 ENCOUNTER — Other Ambulatory Visit: Payer: Self-pay | Admitting: Podiatry

## 2023-06-14 MED ORDER — SULFAMETHOXAZOLE-TRIMETHOPRIM 800-160 MG PO TABS: 1.0000 | ORAL_TABLET | Freq: Two times a day (BID) | ORAL | 0 refills | Status: AC

## 2023-06-17 ENCOUNTER — Encounter: Payer: Self-pay | Admitting: Student

## 2023-06-17 ENCOUNTER — Ambulatory Visit (INDEPENDENT_AMBULATORY_CARE_PROVIDER_SITE_OTHER): Admitting: Student

## 2023-06-17 VITALS — BP 132/52 | HR 91 | Ht 72.0 in

## 2023-06-17 DIAGNOSIS — E11628 Type 2 diabetes mellitus with other skin complications: Secondary | ICD-10-CM

## 2023-06-17 DIAGNOSIS — L089 Local infection of the skin and subcutaneous tissue, unspecified: Secondary | ICD-10-CM

## 2023-06-17 NOTE — Progress Notes (Signed)
 Patient left appointment prior to being seen No charge Will ask front desk to call pt and reschedule

## 2023-06-18 ENCOUNTER — Encounter: Payer: Self-pay | Admitting: Podiatry

## 2023-06-18 ENCOUNTER — Encounter: Payer: Self-pay | Admitting: Gastroenterology

## 2023-06-19 ENCOUNTER — Ambulatory Visit: Admitting: Podiatry

## 2023-06-19 ENCOUNTER — Telehealth: Payer: Self-pay | Admitting: Gastroenterology

## 2023-06-19 NOTE — Telephone Encounter (Signed)
Okay sorry to hear this, thanks for letting me know.

## 2023-06-19 NOTE — Telephone Encounter (Signed)
 Good afternoon Dr. Adela Lank,    Patient requested to cancel 3/28 colonoscopy on 3/25 by mychart message due to being hospitalized twice in the last 6 weeks for a diabetic wound that is still being treated. States there will be additional steps that will have to be taken in order to treat wound and unsure how long she will be healing. Patient has rescheduled for 6/13.    Thank you.

## 2023-06-20 ENCOUNTER — Encounter (HOSPITAL_BASED_OUTPATIENT_CLINIC_OR_DEPARTMENT_OTHER): Attending: General Surgery | Admitting: General Surgery

## 2023-06-20 DIAGNOSIS — L97522 Non-pressure chronic ulcer of other part of left foot with fat layer exposed: Secondary | ICD-10-CM | POA: Diagnosis not present

## 2023-06-20 DIAGNOSIS — L97529 Non-pressure chronic ulcer of other part of left foot with unspecified severity: Secondary | ICD-10-CM | POA: Diagnosis not present

## 2023-06-20 DIAGNOSIS — E11621 Type 2 diabetes mellitus with foot ulcer: Secondary | ICD-10-CM | POA: Diagnosis not present

## 2023-06-21 ENCOUNTER — Other Ambulatory Visit: Payer: Medicaid Other | Admitting: Gastroenterology

## 2023-06-24 ENCOUNTER — Telehealth: Payer: Self-pay | Admitting: Registered Nurse

## 2023-06-26 ENCOUNTER — Telehealth: Payer: Self-pay

## 2023-06-26 NOTE — Telephone Encounter (Signed)
 Pharmacy Patient Advocate Encounter   Received notification from CoverMyMeds that prior authorization for [facility] 2MG  DOSE PENS is required/requested.   Insurance verification completed.   The patient is insured through [facility] MEDICAID .   PA required; PA submitted to above mentioned insurance via CoverMyMeds Key/confirmation #/EOC [id number]. Status is pending

## 2023-06-27 NOTE — Progress Notes (Unsigned)
 Subjective:    Patient ID: [name], female    DOB: [birth year], 53 y.o.   MRN: [id number]  HPI: [name] is a 53 y.o. female who returns for follow up appointment for chronic pain and medication refill. She states her pain is located in  her bilateral hands and bilateral feet with tingling and burning. She rates her pain 6. Her current exercise regime is walking and performing stretching exercises.  [name] was admitted to [facility] : 02/15/20225 and discharged 05/13/2023 for Left lower Extremity Cellulitis. Discharged summary was Reviewed.   [name] was admitted to [facility] on 05/31/2023 and discharged 06/03/2023 with Infected wound. Discharge Summary was reviewed.   [name] Morphine equivalent is 120.00 MME. She  is also prescribed Clonazepam  .We have discussed the black box warning of using opioids and benzodiazepines. I highlighted the dangers of using these drugs together and discussed the adverse events including respiratory suppression, overdose, cognitive impairment and importance of compliance with current regimen. We will continue to monitor and adjust as indicated.     Oral Swab was Performed today.    Pain Inventory Average Pain 6 Pain Right Now 6 My pain is constant, sharp, burning, dull, stabbing, tingling, and aching  In the last 24 hours, has pain interfered with the following? General activity 8 Relation with others 8 Enjoyment of life 8 What TIME of day is your pain at its worst? evening Sleep (in general) Poor  Pain is worse with: walking, bending, standing, and some activites Pain improves with: rest, medication, and massage Relief from Meds: 9  Family History  Problem Relation Age of Onset   Stroke Mother    Fibromyalgia Mother    Aortic aneurysm Mother    Stroke Father    Colon polyps Brother    Stroke Maternal Grandmother    Colon cancer Maternal Grandmother    Heart Problems Paternal Grandmother     Dementia Paternal Grandmother    Heart attack Paternal Grandfather    Esophageal cancer Neg Hx    Rectal cancer Neg Hx    Stomach cancer Neg Hx    Social History   Socioeconomic History   Marital status: Legally Separated    Spouse name: Not on file   Number of children: 1   Years of education: Not on file   Highest education level: Bachelor's degree (e.g., BA, AB, BS)  Occupational History   Occupation: Disabled  Tobacco Use   Smoking status: Never   Smokeless tobacco: Never  Vaping Use   Vaping status: Never Used  Substance and Sexual Activity   Alcohol use: No    Alcohol/week: 0.0 standard drinks of alcohol   Drug use: No    Comment: rx opiods   Sexual activity: Yes    Birth control/protection: Condom  Other Topics Concern   Not on file  Social History Narrative   Not on file   Social Drivers of Health   Financial Resource Strain: High Risk (03/14/2023)   Overall Financial Resource Strain (CARDIA)    Difficulty of Paying Living Expenses: Very hard  Food Insecurity: No Food Insecurity (05/31/2023)   Hunger Vital Sign    Worried About Running Out of Food in the Last Year: Never true    Ran Out of Food in the Last Year: Never true  Recent Concern: Food Insecurity - Food Insecurity Present (03/14/2023)   Hunger Vital Sign    Worried About Running Out of Food in the  Last Year: Often true    Ran Out of Food in the Last Year: Often true  Transportation Needs: No Transportation Needs (05/31/2023)   PRAPARE - Administrator, Civil Service (Medical): No    Lack of Transportation (Non-Medical): No  Physical Activity: Inactive (03/14/2023)   Exercise Vital Sign    Days of Exercise per Week: 1 day    Minutes of Exercise per Session: 0 min  Stress: Stress Concern Present (03/14/2023)   [employer] of Occupational Health - Occupational Stress Questionnaire    Feeling of Stress : Very much  Social Connections: Socially Isolated (03/14/2023)   Social  Connection and Isolation Panel [NHANES]    Frequency of Communication with Friends and Family: Once a week    Frequency of Social Gatherings with Friends and Family: Once a week    Attends Religious Services: Never    Database administrator or Organizations: No    Attends Engineer, structural: Not on file    Marital Status: Separated   Past Surgical History:  Procedure Laterality Date   ABDOMINOPLASTY     CHOLECYSTECTOMY     Past Surgical History:  Procedure Laterality Date   ABDOMINOPLASTY     CHOLECYSTECTOMY     Past Medical History:  Diagnosis Date   Anxiety    Cataract    DEPRESSION/ANXIETY 08/08/2009   Diabetes mellitus without complication (HCC)    Hyperlipidemia    IBS 02/02/2010   Neuropathy    Polyneuropathy 12/04/2010   Renal stones    Sleep apnea 05/2021   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) 08/08/2009   LMP 05/06/2018   Opioid Risk Score:   Fall Risk Score:  `1  Depression screen [facility] 2/9     06/17/2023    2:27 PM 05/31/2023    1:33 PM 03/21/2023    2:40 PM 03/15/2023    3:19 PM 01/21/2023    3:25 PM 11/21/2022    3:43 PM 08/21/2022    3:20 PM  Depression screen PHQ 2/9  Decreased Interest 1 1 0 2 0 0 0  Down, Depressed, Hopeless 1 1 0 2 0 0 0  PHQ - 2 Score 2 2 0 4 0 0 0  Altered sleeping 3 3  3      Tired, decreased energy 3 3  3      Change in appetite 0 0  0     Feeling bad or failure about yourself  3 1  2      Trouble concentrating 1 0  1     Moving slowly or fidgety/restless 0 0  0     Suicidal thoughts 0 0  0     PHQ-9 Score 12 9  13        Review of Systems  Musculoskeletal:  Positive for back pain.       Pain in both feet & lower legs, both hands & wist  All other systems reviewed and are negative.      Objective:   Physical Exam Vitals and nursing note reviewed.  Constitutional:      Appearance: Normal appearance. She is obese.  Cardiovascular:      Rate and Rhythm: Normal rate and regular rhythm.     Pulses: Normal pulses.     Heart sounds: Normal heart sounds.  Pulmonary:     Effort: Pulmonary effort is normal.     Breath sounds: Normal breath sounds.  Musculoskeletal:  Comments: Normal Muscle Bulk and Muscle Testing Reveals:  Upper Extremities: Full ROM and Muscle Strength 5/5  Lower Extremities: Full ROM and Muscle Strength 5/5 Wearing Left Foot Post-Op Shoe  Arises from Table slowly  Narrow Based Gait  Gait     Skin:    General: Skin is warm and dry.  Neurological:     Mental Status: She is alert and oriented to person, place, and time.  Psychiatric:        Mood and Affect: Mood normal.        Behavior: Behavior normal.         Assessment & Plan:  1.Severe peripheral polyneuropathy of undetermined etiology.: 06/28/2023 Refilled: MS Contin 30 mg one tablet every 12 hours #60 and  MSIR 30 mg one tablet at HS, may take a tablet during the day as needed #60.We will continue the opioid monitoring program, this consists of regular clinic visits, examinations, urine drug screen, pill counts as well as use of [place] Controlled Substance Reporting system. A 12 month History has been reviewed on the [place] Controlled Substance Reporting System on 06/28/2023 2. Bilateral progressive upper extremity hand pain/ Polyneuropathy. Continue with current medication regime. Tight Glucose Control. Adhere to healthy diet regime. Continue with current medication regimen with nortriptyline: 06/28/2023 3. Muscle Spasms: Continue current medication regimen with  Zanaflex. Continue to Monitor.  06/28/2023 4. Depression/Anxiety: Continue current medication regimen with Paxil and  Klonopin. Continue to Monitor. 00/06/2023  5. Left Foot Wound: Wound Care Following. Continue to Monitor.   F/U in 1 month

## 2023-06-27 NOTE — Telephone Encounter (Signed)
 Pharmacy Patient Advocate Encounter  Received notification from [facility] MEDICAID that Prior Authorization for [facility] 2[facility]  has been APPROVED from 06/26/23 to 06/25/24   PA #/Case ID/Reference #: [id number]

## 2023-06-28 ENCOUNTER — Encounter: Attending: Physical Medicine & Rehabilitation | Admitting: Registered Nurse

## 2023-06-28 ENCOUNTER — Encounter: Payer: Self-pay | Admitting: Registered Nurse

## 2023-06-28 VITALS — BP 124/77 | HR 91 | Ht 72.0 in

## 2023-06-28 DIAGNOSIS — Z79891 Long term (current) use of opiate analgesic: Secondary | ICD-10-CM

## 2023-06-28 DIAGNOSIS — E1140 Type 2 diabetes mellitus with diabetic neuropathy, unspecified: Secondary | ICD-10-CM | POA: Diagnosis not present

## 2023-06-28 DIAGNOSIS — F341 Dysthymic disorder: Secondary | ICD-10-CM

## 2023-06-28 DIAGNOSIS — Z794 Long term (current) use of insulin: Secondary | ICD-10-CM | POA: Diagnosis not present

## 2023-06-28 DIAGNOSIS — Z5181 Encounter for therapeutic drug level monitoring: Secondary | ICD-10-CM

## 2023-06-28 DIAGNOSIS — G894 Chronic pain syndrome: Secondary | ICD-10-CM | POA: Diagnosis not present

## 2023-06-28 MED ORDER — MORPHINE SULFATE ER 30 MG PO TBCR: 30.0000 mg | EXTENDED_RELEASE_TABLET | Freq: Two times a day (BID) | ORAL | 0 refills | Status: DC

## 2023-06-28 MED ORDER — MORPHINE SULFATE 30 MG PO TABS: ORAL_TABLET | ORAL | 0 refills | Status: DC

## 2023-06-28 MED ORDER — TIZANIDINE HCL 2 MG PO TABS: 2.0000 mg | ORAL_TABLET | Freq: Three times a day (TID) | ORAL | 1 refills | Status: DC

## 2023-07-01 ENCOUNTER — Encounter (HOSPITAL_BASED_OUTPATIENT_CLINIC_OR_DEPARTMENT_OTHER): Attending: General Surgery | Admitting: General Surgery

## 2023-07-01 ENCOUNTER — Telehealth: Payer: Self-pay | Admitting: *Deleted

## 2023-07-01 ENCOUNTER — Telehealth: Payer: Self-pay | Admitting: Registered Nurse

## 2023-07-01 DIAGNOSIS — E11621 Type 2 diabetes mellitus with foot ulcer: Secondary | ICD-10-CM | POA: Diagnosis not present

## 2023-07-01 DIAGNOSIS — L97529 Non-pressure chronic ulcer of other part of left foot with unspecified severity: Secondary | ICD-10-CM | POA: Insufficient documentation

## 2023-07-01 DIAGNOSIS — L97522 Non-pressure chronic ulcer of other part of left foot with fat layer exposed: Secondary | ICD-10-CM | POA: Diagnosis not present

## 2023-07-01 MED ORDER — MORPHINE SULFATE ER 30 MG PO TBCR
30.0000 mg | EXTENDED_RELEASE_TABLET | Freq: Two times a day (BID) | ORAL | 0 refills | Status: DC
Start: 2023-07-01 — End: 2023-07-26

## 2023-07-01 NOTE — Telephone Encounter (Signed)
 PMP was Reviewed.  MS Contin e-scribed to pharmacy.  [name] is aware via My-Chart message.

## 2023-07-01 NOTE — Telephone Encounter (Signed)
 Please send in the remainder of her Rx for Morphine Sulfate ER.

## 2023-07-01 NOTE — Telephone Encounter (Signed)
 Prior auth initiated with  insurance for Morphine Sulfate ER 30 mg #60 via CoverMyMeds.  Roma Kayser (Key: [id number])

## 2023-07-01 NOTE — Telephone Encounter (Signed)
 Outcome Approved today by [facility] Medicaid 2017 NCPDP Request Reference Number: [id number]. MORPHINE SUL TAB 30MG  ER is approved through 09/30/2023. For further questions, call [employer] at [phone]. Effective Date: 07/01/2023 Authorization Expiration Date: 09/30/2023.

## 2023-07-02 LAB — DRUG TOX MONITOR 1 W/CONF, ORAL FLD
Alprazolam: NEGATIVE ng/mL (ref <0.50)
Aminoclonazepam: 0.63 ng/mL — ABNORMAL HIGH (ref <0.50)
Amphetamines: NEGATIVE ng/mL (ref <10)
Barbiturates: NEGATIVE ng/mL (ref <10)
Benzodiazepines: POSITIVE ng/mL — AB (ref <0.50)
Buprenorphine: NEGATIVE ng/mL (ref <0.10)
Chlordiazepoxide: NEGATIVE ng/mL (ref <0.50)
Clonazepam: NEGATIVE ng/mL (ref <0.50)
Cocaine: NEGATIVE ng/mL (ref <5.0)
Codeine: NEGATIVE ng/mL (ref <2.5)
Diazepam: NEGATIVE ng/mL (ref <0.50)
Dihydrocodeine: NEGATIVE ng/mL (ref <2.5)
Fentanyl: NEGATIVE ng/mL (ref <0.10)
Flunitrazepam: NEGATIVE ng/mL (ref <0.50)
Flurazepam: NEGATIVE ng/mL (ref <0.50)
Heroin Metabolite: NEGATIVE ng/mL (ref <1.0)
Hydrocodone: NEGATIVE ng/mL (ref <2.5)
Hydromorphone: NEGATIVE ng/mL (ref <2.5)
Lorazepam: NEGATIVE ng/mL (ref <0.50)
MARIJUANA: NEGATIVE ng/mL (ref <2.5)
MDMA: NEGATIVE ng/mL (ref <10)
Meprobamate: NEGATIVE ng/mL (ref <2.5)
Methadone: NEGATIVE ng/mL (ref <5.0)
Midazolam: NEGATIVE ng/mL (ref <0.50)
Morphine: 12.8 ng/mL — ABNORMAL HIGH (ref <2.5)
Nicotine Metabolite: NEGATIVE ng/mL (ref <5.0)
Nordiazepam: NEGATIVE ng/mL (ref <0.50)
Norhydrocodone: NEGATIVE ng/mL (ref <2.5)
Noroxycodone: NEGATIVE ng/mL (ref <2.5)
Opiates: POSITIVE ng/mL — AB (ref <2.5)
Oxazepam: NEGATIVE ng/mL (ref <0.50)
Oxycodone: NEGATIVE ng/mL (ref <2.5)
Oxymorphone: NEGATIVE ng/mL (ref <2.5)
Phencyclidine: NEGATIVE ng/mL (ref <10)
Tapentadol: NEGATIVE ng/mL (ref <5.0)
Temazepam: NEGATIVE ng/mL (ref <0.50)
Tramadol: NEGATIVE ng/mL (ref <5.0)
Triazolam: NEGATIVE ng/mL (ref <0.50)
Zolpidem: NEGATIVE ng/mL (ref <5.0)

## 2023-07-02 LAB — DRUG TOX ALC METAB W/CON, ORAL FLD: Alcohol Metabolite: NEGATIVE ng/mL (ref <25)

## 2023-07-08 ENCOUNTER — Ambulatory Visit (HOSPITAL_BASED_OUTPATIENT_CLINIC_OR_DEPARTMENT_OTHER): Admitting: General Surgery

## 2023-07-16 ENCOUNTER — Encounter (HOSPITAL_BASED_OUTPATIENT_CLINIC_OR_DEPARTMENT_OTHER): Admitting: General Surgery

## 2023-07-17 ENCOUNTER — Ambulatory Visit: Admitting: Registered Nurse

## 2023-07-21 ENCOUNTER — Other Ambulatory Visit: Payer: Self-pay | Admitting: Student

## 2023-07-21 DIAGNOSIS — E1159 Type 2 diabetes mellitus with other circulatory complications: Secondary | ICD-10-CM

## 2023-07-21 DIAGNOSIS — E118 Type 2 diabetes mellitus with unspecified complications: Secondary | ICD-10-CM

## 2023-07-22 ENCOUNTER — Other Ambulatory Visit: Payer: Self-pay | Admitting: Family Medicine

## 2023-07-22 ENCOUNTER — Encounter: Payer: Self-pay | Admitting: Obstetrics & Gynecology

## 2023-07-22 ENCOUNTER — Ambulatory Visit: Admitting: Obstetrics & Gynecology

## 2023-07-22 ENCOUNTER — Encounter (HOSPITAL_BASED_OUTPATIENT_CLINIC_OR_DEPARTMENT_OTHER): Admitting: General Surgery

## 2023-07-22 DIAGNOSIS — E118 Type 2 diabetes mellitus with unspecified complications: Secondary | ICD-10-CM

## 2023-07-24 ENCOUNTER — Encounter: Admitting: Registered Nurse

## 2023-07-24 NOTE — Progress Notes (Deleted)
 Subjective:    Patient ID: [name], female    DOB: [birth year], 52 y.o.   MRN: [id number]  HPI  Pain Inventory Average Pain {NUMBERS; 0-10:5044} Pain Right Now {NUMBERS; 0-10:5044} My pain is {PAIN DESCRIPTION:21022940}  In the last 24 hours, has pain interfered with the following? General activity {NUMBERS; 0-10:5044} Relation with others {NUMBERS; 0-10:5044} Enjoyment of life {NUMBERS; 0-10:5044} What TIME of day is your pain at its worst? {time of day:24191} Sleep (in general) {BHH GOOD/FAIR/POOR:22877}  Pain is worse with: {ACTIVITIES:21022942} Pain improves with: {PAIN IMPROVES NGEX:52841324} Relief from Meds: {NUMBERS; 0-10:5044}  Family History  Problem Relation Age of Onset   Stroke Mother    Fibromyalgia Mother    Aortic aneurysm Mother    Stroke Father    Colon polyps Brother    Stroke Maternal Grandmother    Colon cancer Maternal Grandmother    Heart Problems Paternal Grandmother    Dementia Paternal Grandmother    Heart attack Paternal Grandfather    Esophageal cancer Neg Hx    Rectal cancer Neg Hx    Stomach cancer Neg Hx    Social History   Socioeconomic History   Marital status: Legally Separated    Spouse name: Not on file   Number of children: 1   Years of education: Not on file   Highest education level: Bachelor's degree (e.g., BA, AB, BS)  Occupational History   Occupation: Disabled  Tobacco Use   Smoking status: Never   Smokeless tobacco: Never  Vaping Use   Vaping status: Never Used  Substance and Sexual Activity   Alcohol  use: No    Alcohol /week: 0.0 standard drinks of alcohol    Drug use: No    Comment: rx opiods   Sexual activity: Yes    Birth control/protection: Condom  Other Topics Concern   Not on file  Social History Narrative   Not on file   Social Drivers of Health   Financial Resource Strain: High Risk (03/14/2023)   Overall Financial Resource Strain (CARDIA)    Difficulty of Paying Living Expenses: Very  hard  Food Insecurity: No Food Insecurity (05/31/2023)   Hunger Vital Sign    Worried About Running Out of Food in the Last Year: Never true    Ran Out of Food in the Last Year: Never true  Recent Concern: Food Insecurity - Food Insecurity Present (03/14/2023)   Hunger Vital Sign    Worried About Running Out of Food in the Last Year: Often true    Ran Out of Food in the Last Year: Often true  Transportation Needs: No Transportation Needs (05/31/2023)   PRAPARE - Administrator, Civil Service (Medical): No    Lack of Transportation (Non-Medical): No  Physical Activity: Inactive (03/14/2023)   Exercise Vital Sign    Days of Exercise per Week: 1 day    Minutes of Exercise per Session: 0 min  Stress: Stress Concern Present (03/14/2023)   [employer] of Occupational Health - Occupational Stress Questionnaire    Feeling of Stress : Very much  Social Connections: Socially Isolated (03/14/2023)   Social Connection and Isolation Panel [NHANES]    Frequency of Communication with Friends and Family: Once a week    Frequency of Social Gatherings with Friends and Family: Once a week    Attends Religious Services: Never    Database administrator or Organizations: No    Attends Engineer, structural: Not on file    Marital Status:  Separated   Past Surgical History:  Procedure Laterality Date   ABDOMINOPLASTY     CHOLECYSTECTOMY     Past Surgical History:  Procedure Laterality Date   ABDOMINOPLASTY     CHOLECYSTECTOMY     Past Medical History:  Diagnosis Date   Anxiety    Cataract    DEPRESSION/ANXIETY 08/08/2009   Diabetes mellitus without complication (HCC)    Hyperlipidemia    IBS 02/02/2010   Neuropathy    Polyneuropathy 12/04/2010   Renal stones    Sleep apnea 05/2021   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) 08/08/2009   LMP 05/06/2018   Opioid Risk Score:   Fall Risk Score:   `1  Depression screen [facility] 2/9     06/28/2023    3:08 PM 06/17/2023    2:27 PM 05/31/2023    1:33 PM 03/21/2023    2:40 PM 03/15/2023    3:19 PM 01/21/2023    3:25 PM 11/21/2022    3:43 PM  Depression screen PHQ 2/9  Decreased Interest 1 1 1  0 2 0 0  Down, Depressed, Hopeless 1 1 1  0 2 0 0  PHQ - 2 Score 2 2 2  0 4 0 0  Altered sleeping  3 3  3     Tired, decreased energy  3 3  3     Change in appetite  0 0  0    Feeling bad or failure about yourself   3 1  2     Trouble concentrating  1 0  1    Moving slowly or fidgety/restless  0 0  0    Suicidal thoughts  0 0  0    PHQ-9 Score  12 9  13        Review of Systems     Objective:   Physical Exam        Assessment & Plan:

## 2023-07-25 NOTE — Progress Notes (Signed)
 Subjective:    Patient ID: [name], female    DOB: 1970/06/16, 53 y.o.   MRN: [id number]  HPI: [name] is a 53 y.o. female who returns for follow up appointment for chronic pain and medication refill. She states her pain is located in her bilateral hands and bilateral feet with tingling and burning. She rates her pain 5. Her current exercise regime is walking and performing stretching exercises.  [name] Morphine  equivalent is 120.00 MME.  She is also prescribed Clonazepam   .We have discussed the black box warning of using opioids and benzodiazepines. I highlighted the dangers of using these drugs together and discussed the adverse events including respiratory suppression, overdose, cognitive impairment and importance of compliance with current regimen. We will continue to monitor and adjust as indicated.   Last Oral Swab was Performed on 06/28/2023, it was consistent.     Pain Inventory Average Pain 6 Pain Right Now 5 My pain is constant, sharp, burning, dull, stabbing, tingling, and aching  In the last 24 hours, has pain interfered with the following? General activity 8 Relation with others 8 Enjoyment of life 8 What TIME of day is your pain at its worst? evening Sleep (in general) Poor  Pain is worse with: walking, bending, and standing Pain improves with: rest and medication Relief from Meds: 8  Family History  Problem Relation Age of Onset  . Stroke Mother   . Fibromyalgia Mother   . Aortic aneurysm Mother   . Stroke Father   . Colon polyps Brother   . Stroke Maternal Grandmother   . Colon cancer Maternal Grandmother   . Heart Problems Paternal Grandmother   . Dementia Paternal Grandmother   . Heart attack Paternal Grandfather   . Esophageal cancer Neg Hx   . Rectal cancer Neg Hx   . Stomach cancer Neg Hx    Social History   Socioeconomic History  . Marital status: Legally Separated    Spouse name: Not on file  . Number of  children: 1  . Years of education: Not on file  . Highest education level: Bachelor's degree (e.g., BA, AB, BS)  Occupational History  . Occupation: Disabled  Tobacco Use  . Smoking status: Never  . Smokeless tobacco: Never  Vaping Use  . Vaping status: Never Used  Substance and Sexual Activity  . Alcohol  use: No    Alcohol /week: 0.0 standard drinks of alcohol   . Drug use: No    Comment: rx opiods  . Sexual activity: Yes    Birth control/protection: Condom  Other Topics Concern  . Not on file  Social History Narrative  . Not on file   Social Drivers of Health   Financial Resource Strain: High Risk (03/14/2023)   Overall Financial Resource Strain (CARDIA)   . Difficulty of Paying Living Expenses: Very hard  Food Insecurity: No Food Insecurity (05/31/2023)   Hunger Vital Sign   . Worried About Programme researcher, broadcasting/film/video in the Last Year: Never true   . Ran Out of Food in the Last Year: Never true  Recent Concern: Food Insecurity - Food Insecurity Present (03/14/2023)   Hunger Vital Sign   . Worried About Programme researcher, broadcasting/film/video in the Last Year: Often true   . Ran Out of Food in the Last Year: Often true  Transportation Needs: No Transportation Needs (05/31/2023)   PRAPARE - Transportation   . Lack of Transportation (Medical): No   . Lack of Transportation (Non-Medical): No  Physical Activity: Inactive (03/14/2023)   Exercise Vital Sign   . Days of Exercise per Week: 1 day   . Minutes of Exercise per Session: 0 min  Stress: Stress Concern Present (03/14/2023)   [employer] of Occupational Health - Occupational Stress Questionnaire   . Feeling of Stress : Very much  Social Connections: Socially Isolated (03/14/2023)   Social Connection and Isolation Panel [NHANES]   . Frequency of Communication with Friends and Family: Once a week   . Frequency of Social Gatherings with Friends and Family: Once a week   . Attends Religious Services: Never   . Active Member of Clubs or  Organizations: No   . Attends Banker Meetings: Not on file   . Marital Status: Separated   Past Surgical History:  Procedure Laterality Date  . ABDOMINOPLASTY    . CHOLECYSTECTOMY     Past Surgical History:  Procedure Laterality Date  . ABDOMINOPLASTY    . CHOLECYSTECTOMY     Past Medical History:  Diagnosis Date  . Anxiety   . Cataract   . DEPRESSION/ANXIETY 08/08/2009  . Diabetes mellitus without complication (HCC)   . Hyperlipidemia   . IBS 02/02/2010  . Neuropathy   . Polyneuropathy 12/04/2010  . Renal stones   . Sleep apnea 05/2021  . 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 rate 16, weight 258 lb 12.8 oz (117.4 kg), last menstrual period 05/06/2018, SpO2 98%.   LMP 05/06/2018   Opioid Risk Score:   Fall Risk Score:  `1  Depression screen [facility] 2/9     06/28/2023    3:08 PM 06/17/2023    2:27 PM 05/31/2023    1:33 PM 03/21/2023    2:40 PM 03/15/2023    3:19 PM 01/21/2023    3:25 PM 11/21/2022    3:43 PM  Depression screen PHQ 2/9  Decreased Interest 1 1 1  0 2 0 0  Down, Depressed, Hopeless 1 1 1  0 2 0 0  PHQ - 2 Score 2 2 2  0 4 0 0  Altered sleeping  3 3  3     Tired, decreased energy  3 3  3     Change in appetite  0 0  0    Feeling bad or failure about yourself   3 1  2     Trouble concentrating  1 0  1    Moving slowly or fidgety/restless  0 0  0    Suicidal thoughts  0 0  0    PHQ-9 Score  12 9  13        Review of Systems  Musculoskeletal:        B/l hands B/l wrist B/l feet Bl legs       Objective:   Physical Exam Vitals and nursing note reviewed.  Constitutional:      Appearance: Normal appearance.  Cardiovascular:     Rate and Rhythm: Normal rate and regular rhythm.     Pulses: Normal pulses.     Heart sounds: Normal heart sounds.  Pulmonary:     Effort: Pulmonary effort is normal.     Breath  sounds: Normal breath sounds.  Musculoskeletal:     Comments: Normal Muscle Bulk and Muscle Testing Reveals:  Upper Extremities: Full ROM and Muscle Strength  5/5 Lower Extremities: Full ROM and Muscle Strength 5/5 Arises from Table with ease Narrow Based  Gait  Skin:    General: Skin is warm and dry.  Neurological:     Mental Status: She is alert and oriented to person, place, and time.  Psychiatric:        Mood and Affect: Mood normal.        Behavior: Behavior normal.         Assessment & Plan:  1.Severe peripheral polyneuropathy of undetermined etiology.: 07/26/2023 Refilled: MS Contin  30 mg one tablet every 12 hours #60 and  MSIR 30 mg one tablet at HS, may take a tablet during the day as needed #60.We will continue the opioid monitoring program, this consists of regular clinic visits, examinations, urine drug screen, pill counts as well as use of [place]  Controlled Substance Reporting system. A 12 month History has been reviewed on the   Controlled Substance Reporting System on 06/28/2023 2. Bilateral progressive upper extremity hand pain/ Polyneuropathy. Continue with current medication regime. Tight Glucose Control. Adhere to healthy diet regime. Continue with current medication regimen with nortriptyline : 07/26/2023 3. Muscle Spasms: Continue current medication regimen with  Zanaflex . Continue to Monitor.  07/26/2023 4. Depression/Anxiety: Continue current medication regimen with Paxil  and  Klonopin . Continue to Monitor. 07/26/2023  5. Left Foot Wound: Wound Care Following. Continue to Monitor. 07/26/2023   F/U in 1 month

## 2023-07-26 ENCOUNTER — Encounter: Attending: Physical Medicine & Rehabilitation | Admitting: Registered Nurse

## 2023-07-26 ENCOUNTER — Encounter: Payer: Self-pay | Admitting: Registered Nurse

## 2023-07-26 ENCOUNTER — Telehealth: Payer: Self-pay

## 2023-07-26 ENCOUNTER — Encounter

## 2023-07-26 VITALS — BP 118/80 | HR 94 | Resp 16 | Wt 258.8 lb

## 2023-07-26 DIAGNOSIS — Z794 Long term (current) use of insulin: Secondary | ICD-10-CM

## 2023-07-26 DIAGNOSIS — G894 Chronic pain syndrome: Secondary | ICD-10-CM | POA: Insufficient documentation

## 2023-07-26 DIAGNOSIS — Z5181 Encounter for therapeutic drug level monitoring: Secondary | ICD-10-CM | POA: Insufficient documentation

## 2023-07-26 DIAGNOSIS — F341 Dysthymic disorder: Secondary | ICD-10-CM | POA: Insufficient documentation

## 2023-07-26 DIAGNOSIS — E1140 Type 2 diabetes mellitus with diabetic neuropathy, unspecified: Secondary | ICD-10-CM | POA: Diagnosis not present

## 2023-07-26 DIAGNOSIS — Z79891 Long term (current) use of opiate analgesic: Secondary | ICD-10-CM | POA: Insufficient documentation

## 2023-07-26 MED ORDER — MORPHINE SULFATE 30 MG PO TABS: ORAL_TABLET | ORAL | 0 refills | Status: DC

## 2023-07-26 MED ORDER — MORPHINE SULFATE ER 30 MG PO TBCR
30.0000 mg | EXTENDED_RELEASE_TABLET | Freq: Two times a day (BID) | ORAL | 0 refills | Status: DC
Start: 2023-07-26 — End: 2023-08-22

## 2023-07-26 NOTE — Telephone Encounter (Signed)
(  Key: [id number]) PA Case ID #: [id number] submitted for Morphine  20 mg IR

## 2023-07-29 NOTE — Telephone Encounter (Signed)
 Outcome Approved on May 2 by [facility] 2017 NCPDP Request Reference Number: [id number]. MORPHINE  SUL TAB 30MG  is approved through 01/26/2024. For further questions, call [employer] at [phone]. Effective Date: 07/26/2023 Authorization Expiration Date: 01/26/2024

## 2023-07-31 ENCOUNTER — Encounter (HOSPITAL_BASED_OUTPATIENT_CLINIC_OR_DEPARTMENT_OTHER): Admitting: General Surgery

## 2023-07-31 ENCOUNTER — Ambulatory Visit (HOSPITAL_BASED_OUTPATIENT_CLINIC_OR_DEPARTMENT_OTHER): Admitting: General Surgery

## 2023-08-06 ENCOUNTER — Ambulatory Visit: Admitting: Obstetrics & Gynecology

## 2023-08-11 ENCOUNTER — Other Ambulatory Visit: Payer: Self-pay | Admitting: Student

## 2023-08-11 DIAGNOSIS — F325 Major depressive disorder, single episode, in full remission: Secondary | ICD-10-CM

## 2023-08-16 ENCOUNTER — Encounter: Payer: Self-pay | Admitting: Student

## 2023-08-17 ENCOUNTER — Other Ambulatory Visit: Payer: Self-pay | Admitting: Student

## 2023-08-17 DIAGNOSIS — E781 Pure hyperglyceridemia: Secondary | ICD-10-CM

## 2023-08-17 DIAGNOSIS — E118 Type 2 diabetes mellitus with unspecified complications: Secondary | ICD-10-CM

## 2023-08-21 ENCOUNTER — Ambulatory Visit: Admitting: Registered Nurse

## 2023-08-22 ENCOUNTER — Encounter: Payer: Self-pay | Admitting: Registered Nurse

## 2023-08-22 ENCOUNTER — Encounter: Admitting: Registered Nurse

## 2023-08-22 VITALS — BP 108/69 | HR 90 | Ht 72.0 in | Wt 255.0 lb

## 2023-08-22 DIAGNOSIS — G894 Chronic pain syndrome: Secondary | ICD-10-CM | POA: Diagnosis not present

## 2023-08-22 DIAGNOSIS — Z794 Long term (current) use of insulin: Secondary | ICD-10-CM

## 2023-08-22 DIAGNOSIS — E1140 Type 2 diabetes mellitus with diabetic neuropathy, unspecified: Secondary | ICD-10-CM | POA: Diagnosis not present

## 2023-08-22 DIAGNOSIS — F341 Dysthymic disorder: Secondary | ICD-10-CM

## 2023-08-22 DIAGNOSIS — Z79891 Long term (current) use of opiate analgesic: Secondary | ICD-10-CM

## 2023-08-22 DIAGNOSIS — Z5181 Encounter for therapeutic drug level monitoring: Secondary | ICD-10-CM | POA: Diagnosis not present

## 2023-08-22 MED ORDER — MORPHINE SULFATE 30 MG PO TABS: ORAL_TABLET | ORAL | 0 refills | Status: DC

## 2023-08-22 MED ORDER — MORPHINE SULFATE ER 30 MG PO TBCR: 30.0000 mg | EXTENDED_RELEASE_TABLET | Freq: Two times a day (BID) | ORAL | 0 refills | Status: DC

## 2023-08-22 MED ORDER — CLONAZEPAM 0.5 MG PO TABS: ORAL_TABLET | ORAL | 3 refills | Status: DC

## 2023-08-22 NOTE — Progress Notes (Signed)
 Subjective:    Patient ID: [name], female    DOB: May 26, 1970, 53 y.o.   MRN: [id number]  HPI: [name] is a 53 y.o. female who returns for follow up appointment for chronic pain and medication refill. She states her pain is located in her bilateral hands and bilateral feet with tingling and burning. She rates her pain 5. Her current exercise regime is walking and performing stretching exercises.  Ms, [place] Morphine  equivalent is 120.00 MME. She is also prescribed Clonazepam  .We have discussed the black box warning of using opioids and benzodiazepines. I highlighted the dangers of using these drugs together and discussed the adverse events including respiratory suppression, overdose, cognitive impairment and importance of compliance with current regimen. We will continue to monitor and adjust as indicated.    Last Oral Swab was Performed on 06/28/2023, it was consistent.      Pain Inventory Average Pain 5 Pain Right Now 5 My pain is constant, sharp, burning, dull, stabbing, tingling, and aching  In the last 24 hours, has pain interfered with the following? General activity 7 Relation with others 7 Enjoyment of life 6 What TIME of day is your pain at its worst? evening and varies Sleep (in general) Poor  Pain is worse with: walking, bending, standing, and some activites Pain improves with: rest and medication Relief from Meds: 7  Family History  Problem Relation Age of Onset   Stroke Mother    Fibromyalgia Mother    Aortic aneurysm Mother    Stroke Father    Colon polyps Brother    Stroke Maternal Grandmother    Colon cancer Maternal Grandmother    Heart Problems Paternal Grandmother    Dementia Paternal Grandmother    Heart attack Paternal Grandfather    Esophageal cancer Neg Hx    Rectal cancer Neg Hx    Stomach cancer Neg Hx    Social History   Socioeconomic History   Marital status: Legally Separated    Spouse name: Not on file    Number of children: 1   Years of education: Not on file   Highest education level: Bachelor's degree (e.g., BA, AB, BS)  Occupational History   Occupation: Disabled  Tobacco Use   Smoking status: Never   Smokeless tobacco: Never  Vaping Use   Vaping status: Never Used  Substance and Sexual Activity   Alcohol  use: No    Alcohol /week: 0.0 standard drinks of alcohol    Drug use: No    Comment: rx opiods   Sexual activity: Yes    Birth control/protection: Condom  Other Topics Concern   Not on file  Social History Narrative   Not on file   Social Drivers of Health   Financial Resource Strain: High Risk (03/14/2023)   Overall Financial Resource Strain (CARDIA)    Difficulty of Paying Living Expenses: Very hard  Food Insecurity: No Food Insecurity (05/31/2023)   Hunger Vital Sign    Worried About Running Out of Food in the Last Year: Never true    Ran Out of Food in the Last Year: Never true  Recent Concern: Food Insecurity - Food Insecurity Present (03/14/2023)   Hunger Vital Sign    Worried About Running Out of Food in the Last Year: Often true    Ran Out of Food in the Last Year: Often true  Transportation Needs: No Transportation Needs (05/31/2023)   PRAPARE - Transportation    Lack of Transportation (Medical): No    Lack  of Transportation (Non-Medical): No  Physical Activity: Inactive (03/14/2023)   Exercise Vital Sign    Days of Exercise per Week: 1 day    Minutes of Exercise per Session: 0 min  Stress: Stress Concern Present (03/14/2023)   [employer] of Occupational Health - Occupational Stress Questionnaire    Feeling of Stress : Very much  Social Connections: Socially Isolated (03/14/2023)   Social Connection and Isolation Panel [NHANES]    Frequency of Communication with Friends and Family: Once a week    Frequency of Social Gatherings with Friends and Family: Once a week    Attends Religious Services: Never    Database administrator or Organizations: No     Attends Engineer, structural: Not on file    Marital Status: Separated   Past Surgical History:  Procedure Laterality Date   ABDOMINOPLASTY     CHOLECYSTECTOMY     Past Surgical History:  Procedure Laterality Date   ABDOMINOPLASTY     CHOLECYSTECTOMY     Past Medical History:  Diagnosis Date   Anxiety    Cataract    DEPRESSION/ANXIETY 08/08/2009   Diabetes mellitus without complication (HCC)    Hyperlipidemia    IBS 02/02/2010   Neuropathy    Polyneuropathy 12/04/2010   Renal stones    Sleep apnea 05/2021   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) 08/08/2009   BP 108/69 (BP Location: Left Arm, Patient Position: Sitting)   Pulse 90   Ht 6' (1.829 m)   Wt 255 lb (115.7 kg)   LMP 05/06/2018   SpO2 98%   BMI 34.58 kg/m   Opioid Risk Score:   Fall Risk Score:  `1  Depression screen [facility] 2/9     08/22/2023    2:55 PM 06/28/2023    3:08 PM 06/17/2023    2:27 PM 05/31/2023    1:33 PM 03/21/2023    2:40 PM 03/15/2023    3:19 PM 01/21/2023    3:25 PM  Depression screen PHQ 2/9  Decreased Interest 0 1 1 1  0 2 0  Down, Depressed, Hopeless 0 1 1 1  0 2 0  PHQ - 2 Score 0 2 2 2  0 4 0  Altered sleeping   3 3  3    Tired, decreased energy   3 3  3    Change in appetite   0 0  0   Feeling bad or failure about yourself    3 1  2    Trouble concentrating   1 0  1   Moving slowly or fidgety/restless   0 0  0   Suicidal thoughts   0 0  0   PHQ-9 Score   12 9  13      Review of Systems  All other systems reviewed and are negative.      Objective:   Physical Exam Vitals and nursing note reviewed.  Constitutional:      Appearance: Normal appearance.  Cardiovascular:     Rate and Rhythm: Normal rate and regular rhythm.     Pulses: Normal pulses.     Heart sounds: Normal heart sounds.  Pulmonary:     Effort: Pulmonary effort is normal.     Breath sounds: Normal breath sounds.  Musculoskeletal:      Comments: Normal Muscle Bulk and Muscle Testing Reveals:  Upper Extremities: Full ROM and Muscle Strength 5/5  Lower Extremities: Full ROM and Muscle Strength 5/5 Arises from  Table with ease Narrow Based  Gait     Skin:    General: Skin is warm and dry.  Neurological:     Mental Status: She is alert and oriented to person, place, and time.  Psychiatric:        Mood and Affect: Mood normal.        Behavior: Behavior normal.         Assessment & Plan:  1.Severe peripheral polyneuropathy of undetermined etiology.: 08/22/2023 Refilled: MS Contin  30 mg one tablet every 12 hours #60 and  MSIR 30 mg one tablet at HS, may take a tablet during the day as needed #60.We will continue the opioid monitoring program, this consists of regular clinic visits, examinations, urine drug screen, pill counts as well as use of [place]  Controlled Substance Reporting system. A 12 month History has been reviewed on the [place]  Controlled Substance Reporting System on 08/22/2023 2. Bilateral progressive upper extremity hand pain/ Polyneuropathy. Continue with current medication regime. Tight Glucose Control. Adhere to healthy diet regime. Continue with current medication regimen with nortriptyline : 08/22/2023 3. Muscle Spasms: Continue current medication regimen with  Zanaflex . Continue to Monitor.  08/22/2023 4. Depression/Anxiety: Continue current medication regimen with Paxil  and  Klonopin . Continue to Monitor. 08/22/2023  5. Left Foot Wound: Wound Care Following. Continue to Monitor. 08/22/2023   F/U in 1 month

## 2023-08-23 ENCOUNTER — Ambulatory Visit

## 2023-08-23 VITALS — Ht 72.0 in | Wt 250.0 lb

## 2023-08-23 DIAGNOSIS — Z1211 Encounter for screening for malignant neoplasm of colon: Secondary | ICD-10-CM

## 2023-08-23 NOTE — Progress Notes (Signed)
 No egg or soy allergy known to patient  No issues known to pt with past sedation with any surgeries or procedures Patient denies ever being told they had issues or difficulty with intubation  No FH of Malignant Hyperthermia Pt is not on diet pills Pt is not on  home 02  Pt is not on blood thinners  Pt denies issues with constipation- Chronic, narcotic induced  No A fib or A flutter Have any cardiac testing pending--NONE Pt can ambulate- uses cane sometimes due to neuropathy Pt denies use of chewing tobacco Discussed diabetic I weight loss medication holds Discussed NSAID holds Checked BMI Pt instructed to use Singlecare.com or GoodRx for a price reduction on prep  Patient's chart reviewed by Rogena Class CNRA prior to previsit and patient appropriate for the [facility].  Pre visit completed and red dot placed by patient's name on their procedure day (on provider's schedule).

## 2023-08-26 ENCOUNTER — Ambulatory Visit: Admitting: Obstetrics & Gynecology

## 2023-08-28 ENCOUNTER — Ambulatory Visit

## 2023-09-03 ENCOUNTER — Encounter: Payer: Self-pay | Admitting: *Deleted

## 2023-09-04 ENCOUNTER — Encounter: Payer: Self-pay | Admitting: Gastroenterology

## 2023-09-06 ENCOUNTER — Encounter: Admitting: Gastroenterology

## 2023-09-11 ENCOUNTER — Other Ambulatory Visit: Payer: Self-pay | Admitting: Student

## 2023-09-11 DIAGNOSIS — I152 Hypertension secondary to endocrine disorders: Secondary | ICD-10-CM

## 2023-09-18 ENCOUNTER — Encounter: Admitting: Registered Nurse

## 2023-09-20 ENCOUNTER — Other Ambulatory Visit (HOSPITAL_COMMUNITY): Payer: Self-pay

## 2023-09-20 ENCOUNTER — Encounter: Attending: Physical Medicine & Rehabilitation | Admitting: Registered Nurse

## 2023-09-20 VITALS — BP 115/75 | HR 103 | Ht 72.0 in | Wt 262.0 lb

## 2023-09-20 DIAGNOSIS — G894 Chronic pain syndrome: Secondary | ICD-10-CM | POA: Insufficient documentation

## 2023-09-20 DIAGNOSIS — M545 Low back pain, unspecified: Secondary | ICD-10-CM | POA: Insufficient documentation

## 2023-09-20 DIAGNOSIS — E1140 Type 2 diabetes mellitus with diabetic neuropathy, unspecified: Secondary | ICD-10-CM | POA: Insufficient documentation

## 2023-09-20 DIAGNOSIS — G8929 Other chronic pain: Secondary | ICD-10-CM | POA: Diagnosis present

## 2023-09-20 DIAGNOSIS — Z5181 Encounter for therapeutic drug level monitoring: Secondary | ICD-10-CM | POA: Insufficient documentation

## 2023-09-20 DIAGNOSIS — Z794 Long term (current) use of insulin: Secondary | ICD-10-CM | POA: Diagnosis not present

## 2023-09-20 DIAGNOSIS — Z79891 Long term (current) use of opiate analgesic: Secondary | ICD-10-CM | POA: Diagnosis not present

## 2023-09-20 DIAGNOSIS — F341 Dysthymic disorder: Secondary | ICD-10-CM | POA: Insufficient documentation

## 2023-09-20 MED ORDER — CLONAZEPAM 0.5 MG PO TABS: ORAL_TABLET | ORAL | 3 refills | Status: DC

## 2023-09-20 MED ORDER — MORPHINE SULFATE 30 MG PO TABS: ORAL_TABLET | ORAL | 0 refills | Status: DC

## 2023-09-20 MED ORDER — MORPHINE SULFATE ER 30 MG PO TBCR
30.0000 mg | EXTENDED_RELEASE_TABLET | Freq: Two times a day (BID) | ORAL | 0 refills | Status: DC
Start: 2023-09-20 — End: 2023-09-20

## 2023-09-20 MED ORDER — MORPHINE SULFATE ER 30 MG PO TBCR
30.0000 mg | EXTENDED_RELEASE_TABLET | Freq: Two times a day (BID) | ORAL | 0 refills | Status: DC
  Filled 2023-09-20: qty 60, 30d supply, fill #0

## 2023-09-20 NOTE — Progress Notes (Signed)
 Subjective:    Patient ID: [name], female    DOB: 21-Jul-1970, 53 y.o.   MRN: [id number]  HPI: [name] is a 53 y.o. female who returns for follow up appointment for chronic pain and medication refill.She  states her pain is located in  her bilateral hands and bilateral feet with tingling and burning. She also reports lower back pain. She rates her pain 7. Her current exercise regime is walking and performing stretching exercises.  [name]  equivalent is 120.00 MME. She is also prescribed Clonazepam  .We have discussed the black box warning of using opioids and benzodiazepines. I highlighted the dangers of using these drugs together and discussed the adverse events including respiratory suppression, overdose, cognitive impairment and importance of compliance with current regimen. We will continue to monitor and adjust as indicated.     Last Oral Swab was Performed on 06/28/2023, it was consistent.     Pain Inventory Average Pain 7 Pain Right Now 7 My pain is constant, sharp, burning, dull, stabbing, tingling, and aching  In the last 24 hours, has pain interfered with the following? General activity 8 Relation with others 8 Enjoyment of life 9 What TIME of day is your pain at its worst? evening Sleep (in general) Poor  Pain is worse with: walking, bending, standing, and some activites Pain improves with: rest and medication Relief from Meds: 7  Family History  Problem Relation Age of Onset   Stroke Mother    Fibromyalgia Mother    Aortic aneurysm Mother    Stroke Father    Colon polyps Brother    Stroke Maternal Grandmother    Colon cancer Maternal Grandmother    Heart Problems Paternal Grandmother    Dementia Paternal Grandmother    Heart attack Paternal Grandfather    Esophageal cancer Neg Hx    Rectal cancer Neg Hx    Stomach cancer Neg Hx    Social History   Socioeconomic History   Marital status: Legally Separated    Spouse  name: Not on file   Number of children: 1   Years of education: Not on file   Highest education level: Bachelor's degree (e.g., BA, AB, BS)  Occupational History   Occupation: Disabled  Tobacco Use   Smoking status: Never   Smokeless tobacco: Never  Vaping Use   Vaping status: Never Used  Substance and Sexual Activity   Alcohol  use: No    Alcohol /week: 0.0 standard drinks of alcohol    Drug use: No    Comment: rx opiods   Sexual activity: Yes    Birth control/protection: Condom  Other Topics Concern   Not on file  Social History Narrative   Not on file   Social Drivers of Health   Financial Resource Strain: High Risk (03/14/2023)   Overall Financial Resource Strain (CARDIA)    Difficulty of Paying Living Expenses: Very hard  Food Insecurity: No Food Insecurity (05/31/2023)   Hunger Vital Sign    Worried About Running Out of Food in the Last Year: Never true    Ran Out of Food in the Last Year: Never true  Recent Concern: Food Insecurity - Food Insecurity Present (03/14/2023)   Hunger Vital Sign    Worried About Running Out of Food in the Last Year: Often true    Ran Out of Food in the Last Year: Often true  Transportation Needs: No Transportation Needs (05/31/2023)   PRAPARE - Administrator, Civil Service (Medical):  No    Lack of Transportation (Non-Medical): No  Physical Activity: Inactive (03/14/2023)   Exercise Vital Sign    Days of Exercise per Week: 1 day    Minutes of Exercise per Session: 0 min  Stress: Stress Concern Present (03/14/2023)   [employer] of Occupational Health - Occupational Stress Questionnaire    Feeling of Stress : Very much  Social Connections: Socially Isolated (03/14/2023)   Social Connection and Isolation Panel    Frequency of Communication with Friends and Family: Once a week    Frequency of Social Gatherings with Friends and Family: Once a week    Attends Religious Services: Never    Database administrator or  Organizations: No    Attends Engineer, structural: Not on file    Marital Status: Separated   Past Surgical History:  Procedure Laterality Date   ABDOMINOPLASTY     CHOLECYSTECTOMY     Past Surgical History:  Procedure Laterality Date   ABDOMINOPLASTY     CHOLECYSTECTOMY     Past Medical History:  Diagnosis Date   Anxiety    Cataract    DEPRESSION/ANXIETY 08/08/2009   Diabetes mellitus without complication (HCC)    Hyperlipidemia    IBS 02/02/2010   Neuropathy    Polyneuropathy 12/04/2010   Renal stones    Sleep apnea 05/2021   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) 08/08/2009   BP 115/75   Pulse (!) 103   Ht 6' (1.829 m)   Wt 262 lb (118.8 kg)   LMP 05/06/2018   SpO2 98%   BMI 35.53 kg/m   Opioid Risk Score:   Fall Risk Score:  `1  Depression screen [facility] 2/9     08/22/2023    2:55 PM 06/28/2023    3:08 PM 06/17/2023    2:27 PM 05/31/2023    1:33 PM 03/21/2023    2:40 PM 03/15/2023    3:19 PM 01/21/2023    3:25 PM  Depression screen PHQ 2/9  Decreased Interest 0 1 1 1  0 2 0  Down, Depressed, Hopeless 0 1 1 1  0 2 0  PHQ - 2 Score 0 2 2 2  0 4 0  Altered sleeping   3 3  3    Tired, decreased energy   3 3  3    Change in appetite   0 0  0   Feeling bad or failure about yourself    3 1  2    Trouble concentrating   1 0  1   Moving slowly or fidgety/restless   0 0  0   Suicidal thoughts   0 0  0   PHQ-9 Score   12 9  13       Review of Systems  Musculoskeletal:        Bilateral hand and foot pain  All other systems reviewed and are negative.      Objective:   Physical Exam Vitals and nursing note reviewed.  Constitutional:      Appearance: Normal appearance. She is obese.  Cardiovascular:     Rate and Rhythm: Regular rhythm. Tachycardia present.     Pulses: Normal pulses.     Heart sounds: Normal heart sounds.  Pulmonary:     Effort: Pulmonary effort is normal.      Breath sounds: Normal breath sounds.  Musculoskeletal:     Comments: Normal Muscle Bulk and Muscle Testing Reveals:  Upper Extremities: Full ROM  and Muscle Strength  5/5  Lumbar Paraspinal Tenderness: L-4-L-5 Lower Extremities: Full ROM and Muscle Strength 5/5 Arises from Table slowly Narrow Based  Gait     Skin:    General: Skin is warm and dry.  Neurological:     Mental Status: She is alert and oriented to person, place, and time.  Psychiatric:        Mood and Affect: Mood normal.        Behavior: Behavior normal.           Assessment & Plan:  1.Severe peripheral polyneuropathy of undetermined etiology.: 09/20/2023 Refilled: MS Contin  30 mg one tablet every 12 hours #60 and  MSIR 30 mg one tablet at HS, may take a tablet during the day as needed #60.We will continue the opioid monitoring program, this consists of regular clinic visits, examinations, urine drug screen, pill counts as well as use of [place]  Controlled Substance Reporting system. A 12 month History has been reviewed on the [place]  Controlled Substance Reporting System on 09/20/2023 2. Bilateral progressive upper extremity hand pain/ Polyneuropathy. Continue with current medication regime. Tight Glucose Control. Adhere to healthy diet regime. Continue with current medication regimen with nortriptyline : 09/20/2023 3. Muscle Spasms: Continue current medication regimen with  Zanaflex . Continue to Monitor.  09/20/2023 4. Depression/Anxiety: Continue current medication regimen with Paxil  and  Klonopin . Continue to Monitor. 09/20/2023  5. Left Foot Wound: Wound Care Following. Continue to Monitor. 09/20/2023   F/U in 1 month

## 2023-09-23 ENCOUNTER — Ambulatory Visit: Admitting: Obstetrics and Gynecology

## 2023-09-28 ENCOUNTER — Encounter: Payer: Self-pay | Admitting: Registered Nurse

## 2023-10-09 DIAGNOSIS — G4733 Obstructive sleep apnea (adult) (pediatric): Secondary | ICD-10-CM | POA: Diagnosis not present

## 2023-10-14 ENCOUNTER — Other Ambulatory Visit: Payer: Self-pay

## 2023-10-14 DIAGNOSIS — F325 Major depressive disorder, single episode, in full remission: Secondary | ICD-10-CM

## 2023-10-14 NOTE — Progress Notes (Unsigned)
 Subjective:    Patient ID: [name], female    DOB: [birth year], 53 y.o.   MRN: [id number]  HPI: [name] is a 53 y.o. female who returns for follow up appointment for chronic pain and medication refill. She states her  pain is located in her bilateral hands and bilateral feet with tingling and burning. She reports left leg pain since she resume riding her stationary bicycle, she was instructed tto resume bike riding slowing and keepo provider updated on her left leg pain. She verbalizes understanding. She  rates her pain 5. Her current exercise regime is walking, stationary bicycle  for 15 minutes QOD and performing stretching exercises.  Ms. [place] Morphine  equivalent is 120.00 MME. She  is also prescribed Clonazepam  .We have discussed the black box warning of using opioids and benzodiazepines. I highlighted the dangers of using these drugs together and discussed the adverse events including respiratory suppression, overdose, cognitive impairment and importance of compliance with current regimen. We will continue to monitor and adjust as indicated.    Last Oral Swab was Performed on 06/28/2023, it was consistent.      Pain Inventory Average Pain 5 Pain Right Now 5 My pain is constant, sharp, burning, dull, stabbing, tingling, and aching  In the last 24 hours, has pain interfered with the following? General activity 0 Relation with others 0 Enjoyment of life 5 What TIME of day is your pain at its worst? morning , daytime, evening, and night Sleep (in general) Poor  Pain is worse with: walking, bending, standing, and some activites Pain improves with: rest, medication, and massage Relief from Meds: 8  Family History  Problem Relation Age of Onset   Stroke Mother    Fibromyalgia Mother    Aortic aneurysm Mother    Stroke Father    Colon polyps Brother    Stroke Maternal Grandmother    Colon cancer Maternal Grandmother    Heart Problems Paternal  Grandmother    Dementia Paternal Grandmother    Heart attack Paternal Grandfather    Esophageal cancer Neg Hx    Rectal cancer Neg Hx    Stomach cancer Neg Hx    Social History   Socioeconomic History   Marital status: Legally Separated    Spouse name: Not on file   Number of children: 1   Years of education: Not on file   Highest education level: Bachelor's degree (e.g., BA, AB, BS)  Occupational History   Occupation: Disabled  Tobacco Use   Smoking status: Never   Smokeless tobacco: Never  Vaping Use   Vaping status: Never Used  Substance and Sexual Activity   Alcohol  use: No    Alcohol /week: 0.0 standard drinks of alcohol    Drug use: No    Comment: rx opiods   Sexual activity: Yes    Birth control/protection: Condom  Other Topics Concern   Not on file  Social History Narrative   Not on file   Social Drivers of Health   Financial Resource Strain: High Risk (03/14/2023)   Overall Financial Resource Strain (CARDIA)    Difficulty of Paying Living Expenses: Very hard  Food Insecurity: No Food Insecurity (05/31/2023)   Hunger Vital Sign    Worried About Running Out of Food in the Last Year: Never true    Ran Out of Food in the Last Year: Never true  Recent Concern: Food Insecurity - Food Insecurity Present (03/14/2023)   Hunger Vital Sign    Worried About Running  Out of Food in the Last Year: Often true    Ran Out of Food in the Last Year: Often true  Transportation Needs: No Transportation Needs (05/31/2023)   PRAPARE - Administrator, Civil Service (Medical): No    Lack of Transportation (Non-Medical): No  Physical Activity: Inactive (03/14/2023)   Exercise Vital Sign    Days of Exercise per Week: 1 day    Minutes of Exercise per Session: 0 min  Stress: Stress Concern Present (03/14/2023)   [employer] of Occupational Health - Occupational Stress Questionnaire    Feeling of Stress : Very much  Social Connections: Socially Isolated (03/14/2023)    Social Connection and Isolation Panel    Frequency of Communication with Friends and Family: Once a week    Frequency of Social Gatherings with Friends and Family: Once a week    Attends Religious Services: Never    Database administrator or Organizations: No    Attends Engineer, structural: Not on file    Marital Status: Separated   Past Surgical History:  Procedure Laterality Date   ABDOMINOPLASTY     CHOLECYSTECTOMY     Past Surgical History:  Procedure Laterality Date   ABDOMINOPLASTY     CHOLECYSTECTOMY     Past Medical History:  Diagnosis Date   Anxiety    Cataract    DEPRESSION/ANXIETY 08/08/2009   Diabetes mellitus without complication (HCC)    Hyperlipidemia    IBS 02/02/2010   Neuropathy    Polyneuropathy 12/04/2010   Renal stones    Sleep apnea 05/2021   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) 08/08/2009   LMP 05/06/2018   Opioid Risk Score:   Fall Risk Score:  `1  Depression screen [facility] 2/9     08/22/2023    2:55 PM 06/28/2023    3:08 PM 06/17/2023    2:27 PM 05/31/2023    1:33 PM 03/21/2023    2:40 PM 03/15/2023    3:19 PM 01/21/2023    3:25 PM  Depression screen PHQ 2/9  Decreased Interest 0 1 1 1  0 2 0  Down, Depressed, Hopeless 0 1 1 1  0 2 0  PHQ - 2 Score 0 2 2 2  0 4 0  Altered sleeping   3 3  3    Tired, decreased energy   3 3  3    Change in appetite   0 0  0   Feeling bad or failure about yourself    3 1  2    Trouble concentrating   1 0  1   Moving slowly or fidgety/restless   0 0  0   Suicidal thoughts   0 0  0   PHQ-9 Score   12 9  13      Review of Systems  Musculoskeletal:  Positive for back pain and gait problem.       Pain in both legs, feet,hands  All other systems reviewed and are negative.      Objective:   Physical Exam Vitals and nursing note reviewed.  Constitutional:      Appearance: Normal appearance. She is obese.  Cardiovascular:      Rate and Rhythm: Regular rhythm. Tachycardia present.     Pulses: Normal pulses.     Heart sounds: Normal heart sounds.  Pulmonary:     Effort: Pulmonary effort is normal.     Breath sounds: Normal breath sounds.  Musculoskeletal:  Comments: Normal Muscle Bulk and Muscle Testing Reveals:  Upper Extremities:Full  ROM and Muscle Strength 5/5 Lower Extremities: Full ROM and Muscle Strength 5/5 Arises from Table Slowly using cane for support Narrow based  Gait     Skin:    General: Skin is warm and dry.  Neurological:     Mental Status: She is alert and oriented to person, place, and time.  Psychiatric:        Mood and Affect: Mood normal.        Behavior: Behavior normal.          Assessment & Plan:  1.Severe peripheral polyneuropathy of undetermined etiology.: 10/15/2023 Refilled: MS Contin  30 mg one tablet every 12 hours #60 and  MSIR 30 mg one tablet at HS, may take a tablet during the day as needed #60.We will continue the opioid monitoring program, this consists of regular clinic visits, examinations, urine drug screen, pill counts as well as use of [place]  Controlled Substance Reporting system. A 12 month History has been reviewed on the [place]  Controlled Substance Reporting System on 10/15/2023 2. Bilateral progressive upper extremity hand pain/ Polyneuropathy. Continue with current medication regime. Tight Glucose Control. Adhere to healthy diet regime. Continue with current medication regimen with nortriptyline : 10/15/2023 3. Muscle Spasms: Continue current medication regimen with  Zanaflex . Continue to Monitor.  10/15/2023 4. Depression/Anxiety: Continue current medication regimen with Paxil  and  Klonopin . Continue to Monitor. 10/15/2023  5. Left Foot Wound: Wound Care Following. Continue to Monitor. 10/15/2023   F/U in 1 month

## 2023-10-15 ENCOUNTER — Encounter: Payer: Self-pay | Admitting: Registered Nurse

## 2023-10-15 ENCOUNTER — Encounter: Attending: Physical Medicine & Rehabilitation | Admitting: Registered Nurse

## 2023-10-15 VITALS — BP 116/80 | HR 104 | Ht 72.0 in | Wt 261.0 lb

## 2023-10-15 DIAGNOSIS — E1140 Type 2 diabetes mellitus with diabetic neuropathy, unspecified: Secondary | ICD-10-CM | POA: Diagnosis not present

## 2023-10-15 DIAGNOSIS — G894 Chronic pain syndrome: Secondary | ICD-10-CM | POA: Insufficient documentation

## 2023-10-15 DIAGNOSIS — Z794 Long term (current) use of insulin: Secondary | ICD-10-CM

## 2023-10-15 DIAGNOSIS — R000 Tachycardia, unspecified: Secondary | ICD-10-CM | POA: Diagnosis not present

## 2023-10-15 DIAGNOSIS — Z79891 Long term (current) use of opiate analgesic: Secondary | ICD-10-CM | POA: Diagnosis not present

## 2023-10-15 DIAGNOSIS — Z5181 Encounter for therapeutic drug level monitoring: Secondary | ICD-10-CM | POA: Diagnosis not present

## 2023-10-15 DIAGNOSIS — F341 Dysthymic disorder: Secondary | ICD-10-CM | POA: Insufficient documentation

## 2023-10-15 MED ORDER — PAROXETINE HCL 40 MG PO TABS: 40.0000 mg | ORAL_TABLET | Freq: Every day | ORAL | 0 refills | Status: DC

## 2023-10-15 MED ORDER — MORPHINE SULFATE ER 30 MG PO TBCR
30.0000 mg | EXTENDED_RELEASE_TABLET | Freq: Two times a day (BID) | ORAL | 0 refills | Status: DC
Start: 2023-10-15 — End: 2023-11-20

## 2023-10-15 MED ORDER — MORPHINE SULFATE 30 MG PO TABS: ORAL_TABLET | ORAL | 0 refills | Status: DC

## 2023-10-16 ENCOUNTER — Telehealth: Payer: Self-pay | Admitting: Gastroenterology

## 2023-10-16 ENCOUNTER — Ambulatory Visit: Admitting: Registered Nurse

## 2023-10-16 NOTE — Telephone Encounter (Signed)
 Inbound call from patient, states she has questions about her upcoming procedure tomorrow. Patient states her sugar is getting low, she states is it at 85. She also states she was given two boxes of prep medication. She wants to confirm she should only use one box of prep medication.

## 2023-10-16 NOTE — Telephone Encounter (Signed)
 Spoke with patient and she stated that she received 2 boxes of suprep and wanted to know how many bottles to drink. Advised patient to drink the 2 bottles in a split dose, one tonight, and one in the morning before procedure. Pt also stated that due to her having narcotic induced constipation she has not had a bowel movement in almost a week. Advised pt to start first bottle of suprep now and if she has not had a bowel movement by 10pm tonight to call the on-call number for further guidance. Lastly, pt had concerns about her sugar levels, stated it was at 85 right now. Advised pt to get apple juice, hard candy to help keep sugar levels up. Pt verbalized understanding and had no further concerns at the end of the call.

## 2023-10-17 ENCOUNTER — Encounter: Admitting: Gastroenterology

## 2023-10-17 ENCOUNTER — Telehealth: Payer: Self-pay | Admitting: *Deleted

## 2023-10-17 NOTE — Telephone Encounter (Signed)
 Good Morning Dr Leigh   Received a call from patient requesting to cancel today's procedure for 3:00 pm due to her having to eat something this morning around 6 am. Patient states she is diabetic. She will call back to reschedule.

## 2023-10-17 NOTE — Telephone Encounter (Signed)
 Inbound call from patient cancelling to days procedure for 3 pm. Patient states she had something to eat at 6 am this morning. Requesting f/u call from a nurse. Please advise.   Thank you

## 2023-10-17 NOTE — Telephone Encounter (Signed)
 Okay thanks Sarah.  She did not show up today. POD A RN can you clarify with this patient if she plans on ever doing this procedure and what happened for her to no show? If she does want to reschedule, she will be given one more chance, will need to be a double prep, and if she cancels or no shows again will be discharged from the practice. She also will incur a no show fee for today. Thanks

## 2023-10-17 NOTE — Telephone Encounter (Signed)
 So, the patient has already cancelled this twice before leading to a clinic visit and then rescheduled again. This was her 4th time scheduling the exam.   I had thought she was going to be awake for this and doing it unsedated. Can you clarify how much she ate? If she prepped we potentially could still proceed based on how much she ate and timing of the exam. Further, to clarify, this should have been a double prep for her case.  She should be charged a no show fee for this occurrence.  She needs to be honest with us  if she will actually do the exam. If she wishes to do it, I will give her one more chance to schedule this exam, using double prep. If she does not show up or late cancels again she will be discharged from the practice and have to seek her GI care elsewhere.

## 2023-10-17 NOTE — Telephone Encounter (Signed)
 Returned the patient's phone call but she was unavailable. Left VM.

## 2023-10-18 NOTE — Telephone Encounter (Signed)
 Thanks Dottie, appreciate your help with this

## 2023-10-18 NOTE — Telephone Encounter (Signed)
 I have spoken to patient who says she does want to have unsedated colonoscopy completed. Patient states that one late cancel/no show was due to a funeral she had to attend and the no show yesterday was because her glucose dropped down to 60 so she was scared to go to sleep and ended up eating. States she did call first thing in the morning to advise she couldn't come but admits she was not well prepared' for test and had no liquids with sugar in them to raise her glucose levels. States she will be better prepared next time. She is advised that she must keep this next appointment for colonoscopy in order to remain our patient.   Patient has rescheduled to 12/06/23 at 1 pm (she requested 1 pm timing though I suggested morning appointment to allow for better glucose control). Updated prep instructions (2 day miralax /suprep) have been sent via mychart to patient. Patient states that she still has a full Suprep kit at home so no need to resend this to pharmacy.

## 2023-10-18 NOTE — Telephone Encounter (Signed)
 Left message for patient to call back

## 2023-10-22 ENCOUNTER — Telehealth: Payer: Self-pay | Admitting: *Deleted

## 2023-10-22 NOTE — Telephone Encounter (Signed)
 Prior auth initiated with [employer] vial CoverMyMeds [place] (Key: [id number]) PA Case ID #: [id number] Rx #: [phone] Status Sent to Plan today

## 2023-10-23 NOTE — Telephone Encounter (Signed)
 Outcome Approved on July 29 by [employer] 2017 NCPDP Request Reference Number: [id number]. MORPHINE  SUL TAB 30MG  ER is approved through 01/22/2024. For further questions, call [employer] at [phone]. Effective Date: 10/22/2023 Authorization Expiration Date: 01/22/2024

## 2023-10-28 ENCOUNTER — Other Ambulatory Visit (HOSPITAL_COMMUNITY): Payer: Self-pay

## 2023-10-28 ENCOUNTER — Other Ambulatory Visit: Payer: Self-pay

## 2023-10-29 DIAGNOSIS — E119 Type 2 diabetes mellitus without complications: Secondary | ICD-10-CM | POA: Diagnosis not present

## 2023-10-29 DIAGNOSIS — H25813 Combined forms of age-related cataract, bilateral: Secondary | ICD-10-CM | POA: Diagnosis not present

## 2023-10-29 LAB — HM DIABETES EYE EXAM

## 2023-10-31 ENCOUNTER — Ambulatory Visit: Admitting: Obstetrics & Gynecology

## 2023-11-14 ENCOUNTER — Telehealth: Payer: Self-pay | Admitting: *Deleted

## 2023-11-14 NOTE — Progress Notes (Signed)
 Complex Care Management Note Care Guide Note  11/14/2023 Name: [name] MRN: [id number] DOB: 1970/05/26   Complex Care Management Outreach Attempts: An unsuccessful telephone outreach was attempted today to offer the patient information about available complex care management services.  Follow Up Plan:  Additional outreach attempts will be made to offer the patient complex care management information and services.   Encounter Outcome:  No Answer  Harlene Satterfield  [facility]  [facility] Guide  Direct Dial: [phone]  Fax [phone]

## 2023-11-15 NOTE — Progress Notes (Signed)
 Complex Care Management Note Care Guide Note  11/15/2023 Name: [name] MRN: [id number] DOB: 03-10-1971   Complex Care Management Outreach Attempts: A second unsuccessful outreach was attempted today to offer the patient with information about available complex care management services.  Follow Up Plan:  Additional outreach attempts will be made to offer the patient complex care management information and services.   Encounter Outcome:  No Answer  Harlene Satterfield  [facility]  [facility], [facility] Guide  Direct Dial: [phone]  Fax [phone]

## 2023-11-18 NOTE — Progress Notes (Signed)
 Complex Care Management Note Care Guide Note  11/18/2023 Name: [name] MRN: [id number] DOB: 1970-05-06   Complex Care Management Outreach Attempts: A third unsuccessful outreach was attempted today to offer the patient with information about available complex care management services.  Follow Up Plan:  No further outreach attempts will be made at this time. We have been unable to contact the patient to offer or enroll patient in complex care management services.  Encounter Outcome:  No Answer  Harlene Satterfield  [facility]  [facility], [facility] Guide  Direct Dial: [phone]  Fax [phone]

## 2023-11-20 ENCOUNTER — Telehealth: Payer: Self-pay | Admitting: Registered Nurse

## 2023-11-20 ENCOUNTER — Encounter: Admitting: Registered Nurse

## 2023-11-20 MED ORDER — MORPHINE SULFATE 30 MG PO TABS: ORAL_TABLET | ORAL | 0 refills | Status: DC

## 2023-11-20 MED ORDER — MORPHINE SULFATE ER 30 MG PO TBCR: 30.0000 mg | EXTENDED_RELEASE_TABLET | Freq: Two times a day (BID) | ORAL | 0 refills | Status: DC

## 2023-11-20 NOTE — Telephone Encounter (Signed)
 PMP was reviewed MS Contin  and MSIR e-scribed to pharmacy. [name] is aware via My Chart

## 2023-11-20 NOTE — Telephone Encounter (Signed)
 P rescheduled to 9/17 needs med refill

## 2023-11-30 ENCOUNTER — Encounter: Payer: Self-pay | Admitting: Gastroenterology

## 2023-12-02 ENCOUNTER — Ambulatory Visit: Admitting: Obstetrics

## 2023-12-06 ENCOUNTER — Encounter: Admitting: Gastroenterology

## 2023-12-10 ENCOUNTER — Other Ambulatory Visit: Payer: Self-pay | Admitting: Registered Nurse

## 2023-12-11 ENCOUNTER — Telehealth: Payer: Self-pay

## 2023-12-11 ENCOUNTER — Encounter: Attending: Physical Medicine & Rehabilitation | Admitting: Registered Nurse

## 2023-12-11 VITALS — BP 117/65 | Ht 72.0 in | Wt 261.0 lb

## 2023-12-11 DIAGNOSIS — Z5181 Encounter for therapeutic drug level monitoring: Secondary | ICD-10-CM | POA: Insufficient documentation

## 2023-12-11 DIAGNOSIS — E1140 Type 2 diabetes mellitus with diabetic neuropathy, unspecified: Secondary | ICD-10-CM | POA: Insufficient documentation

## 2023-12-11 DIAGNOSIS — Z794 Long term (current) use of insulin: Secondary | ICD-10-CM | POA: Diagnosis not present

## 2023-12-11 DIAGNOSIS — Z79891 Long term (current) use of opiate analgesic: Secondary | ICD-10-CM | POA: Diagnosis not present

## 2023-12-11 DIAGNOSIS — G894 Chronic pain syndrome: Secondary | ICD-10-CM | POA: Insufficient documentation

## 2023-12-11 DIAGNOSIS — F341 Dysthymic disorder: Secondary | ICD-10-CM | POA: Insufficient documentation

## 2023-12-11 MED ORDER — MORPHINE SULFATE ER 30 MG PO TBCR: 30.0000 mg | EXTENDED_RELEASE_TABLET | Freq: Two times a day (BID) | ORAL | 0 refills | Status: DC

## 2023-12-11 MED ORDER — MORPHINE SULFATE 30 MG PO TABS
ORAL_TABLET | ORAL | 0 refills | Status: DC
Start: 2023-12-11 — End: 2024-01-20

## 2023-12-11 NOTE — Progress Notes (Signed)
 Subjective:    Patient ID: [name], female    DOB: March 12, 1971, 53 y.o.   MRN: [id number]  HPI: [name] is a 53 y.o. female whose visit was changed to a telephone visit, I connected with [name] by telephone and verified that I am speaking with the correct person using two identifiers.  Location: Patient: In her home  Provider: In the Office    I discussed the limitations, risks, security and privacy concerns of performing an evaluation and management service by telephone and the availability of in person appointments. I also discussed with the patient that there may be a patient responsible charge related to this service. The patient expressed understanding and agreed to proceed.  She states her pain is located in her bilateral hands and bilateral feet with tingling and burning. She  rates her pain 7. Her current exercise regime is walking, using stationary bicycle for 20 minutes twice week and performing stretching exercises with bands.   Ms. Mimnaugh Morphine  equivalent is 120.00 MME.  She is also prescribed clonazepam . .We have discussed the black box warning of using opioids and benzodiazepines. I highlighted the dangers of using these drugs together and discussed the adverse events including respiratory suppression, overdose, cognitive impairment and importance of compliance with current regimen. We will continue to monitor and adjust as indicated.     Last Oral Swab was Performed on 06/28/2023, it was consistent.    Pain Inventory Average Pain 6 Pain Right Now 7 My pain is constant, sharp, burning, dull, stabbing, tingling, and aching  In the last 24 hours, has pain interfered with the following? General activity 10 Relation with others 10 Enjoyment of life 10 What TIME of day is your pain at its worst? morning , daytime, evening, and night Sleep (in general) Poor  Pain is worse with: walking, bending, sitting, standing, and some  activites Pain improves with: medication and massage Relief from Meds: 8  Family History  Problem Relation Age of Onset   Stroke Mother    Fibromyalgia Mother    Aortic aneurysm Mother    Stroke Father    Colon polyps Brother    Stroke Maternal Grandmother    Colon cancer Maternal Grandmother    Heart Problems Paternal Grandmother    Dementia Paternal Grandmother    Heart attack Paternal Grandfather    Esophageal cancer Neg Hx    Rectal cancer Neg Hx    Stomach cancer Neg Hx    Social History   Socioeconomic History   Marital status: Legally Separated    Spouse name: Not on file   Number of children: 1   Years of education: Not on file   Highest education level: Bachelor's degree (e.g., BA, AB, BS)  Occupational History   Occupation: Disabled  Tobacco Use   Smoking status: Never   Smokeless tobacco: Never  Vaping Use   Vaping status: Never Used  Substance and Sexual Activity   Alcohol  use: No    Alcohol /week: 0.0 standard drinks of alcohol    Drug use: No    Comment: rx opiods   Sexual activity: Yes    Birth control/protection: Condom  Other Topics Concern   Not on file  Social History Narrative   Not on file   Social Drivers of Health   Financial Resource Strain: High Risk (03/14/2023)   Overall Financial Resource Strain (CARDIA)    Difficulty of Paying Living Expenses: Very hard  Food Insecurity: No Food Insecurity (05/31/2023)  Hunger Vital Sign    Worried About Running Out of Food in the Last Year: Never true    Ran Out of Food in the Last Year: Never true  Recent Concern: Food Insecurity - Food Insecurity Present (03/14/2023)   Hunger Vital Sign    Worried About Running Out of Food in the Last Year: Often true    Ran Out of Food in the Last Year: Often true  Transportation Needs: No Transportation Needs (05/31/2023)   PRAPARE - Administrator, Civil Service (Medical): No    Lack of Transportation (Non-Medical): No  Physical Activity:  Inactive (03/14/2023)   Exercise Vital Sign    Days of Exercise per Week: 1 day    Minutes of Exercise per Session: 0 min  Stress: Stress Concern Present (03/14/2023)   [employer] of Occupational Health - Occupational Stress Questionnaire    Feeling of Stress : Very much  Social Connections: Socially Isolated (03/14/2023)   Social Connection and Isolation Panel    Frequency of Communication with Friends and Family: Once a week    Frequency of Social Gatherings with Friends and Family: Once a week    Attends Religious Services: Never    Database administrator or Organizations: No    Attends Engineer, structural: Not on file    Marital Status: Separated   Past Surgical History:  Procedure Laterality Date   ABDOMINOPLASTY     CHOLECYSTECTOMY     Past Surgical History:  Procedure Laterality Date   ABDOMINOPLASTY     CHOLECYSTECTOMY     Past Medical History:  Diagnosis Date   Anxiety    Cataract    DEPRESSION/ANXIETY 08/08/2009   Diabetes mellitus without complication (HCC)    Hyperlipidemia    IBS 02/02/2010   Neuropathy    Polyneuropathy 12/04/2010   Renal stones    Sleep apnea 05/2021   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) 08/08/2009   BP 117/65 (Cuff Size: Normal)   Ht 6' (1.829 m)   Wt 261 lb (118.4 kg)   LMP 05/06/2018   BMI 35.40 kg/m   Opioid Risk Score:   Fall Risk Score:  `1  Depression screen [employer] 2/9     10/15/2023    3:07 PM 08/22/2023    2:55 PM 06/28/2023    3:08 PM 06/17/2023    2:27 PM 05/31/2023    1:33 PM 03/21/2023    2:40 PM 03/15/2023    3:19 PM  Depression screen PHQ 2/9  Decreased Interest 0 0 1 1 1  0 2  Down, Depressed, Hopeless 0 0 1 1 1  0 2  PHQ - 2 Score 0 0 2 2 2  0 4  Altered sleeping    3 3  3   Tired, decreased energy    3 3  3   Change in appetite    0 0  0  Feeling bad or failure about yourself     3 1  2   Trouble concentrating    1 0  1   Moving slowly or fidgety/restless    0 0  0  Suicidal thoughts    0 0  0  PHQ-9 Score    12 9  13       Review of Systems  Musculoskeletal:  Positive for arthralgias and myalgias.       Bilateral knee, hand and foot pain  All other systems reviewed and are negative.  Objective:   Physical Exam Vitals and nursing note reviewed.  Musculoskeletal:     Comments: No Physical Assessment Performed: Telephone visit           Assessment & Plan:  1.Severe peripheral polyneuropathy of undetermined etiology.: 12/11/2023 Refilled: MS Contin  30 mg one tablet every 12 hours #60 and  MSIR 30 mg one tablet at HS, may take a tablet during the day as needed #60.We will continue the opioid monitoring program, this consists of regular clinic visits, examinations, urine drug screen, pill counts as well as use of [place]  Controlled Substance Reporting system. A 12 month History has been reviewed on the [place]  Controlled Substance Reporting System on 12/11/2023 2. Bilateral progressive upper extremity hand pain/ Polyneuropathy. Continue with current medication regime. Tight Glucose Control. Adhere to healthy diet regime. Continue with current medication regimen with nortriptyline : 12/11/2023 3. Muscle Spasms: Continue current medication regimen with  Zanaflex . Continue to Monitor.  12/11/2023 4. Depression/Anxiety: Continue current medication regimen with Paxil  and  Klonopin . Continue to Monitor. 12/11/2023  5. Left Foot Wound: Wound Care Following. Continue to Monitor. 12/11/2023   F/U in 1 month Telephone visit Established Patient Location of patient: In her Home Location of Provider: In the Office  Total Time Spent:  10 Minutes

## 2023-12-11 NOTE — Telephone Encounter (Signed)
 Called, left voicemail for patient re:  arriving between 2:50-3:00 for today's office visit time.  Also, sent a mychart message to patient.

## 2023-12-22 ENCOUNTER — Encounter: Payer: Self-pay | Admitting: Registered Nurse

## 2024-01-02 ENCOUNTER — Other Ambulatory Visit: Payer: Self-pay | Admitting: Family Medicine

## 2024-01-02 ENCOUNTER — Telehealth: Payer: Self-pay

## 2024-01-02 ENCOUNTER — Telehealth: Payer: Self-pay | Admitting: Gastroenterology

## 2024-01-02 DIAGNOSIS — Z1231 Encounter for screening mammogram for malignant neoplasm of breast: Secondary | ICD-10-CM

## 2024-01-02 MED ORDER — NA SULFATE-K SULFATE-MG SULF 17.5-3.13-1.6 GM/177ML PO SOLN: 1.0000 | Freq: Once | ORAL | 0 refills | Status: AC

## 2024-01-02 NOTE — Telephone Encounter (Signed)
 RN returned call to patient who needed new prep instructions for her rescheduled colonoscopy. Patient also requested a new prescription for the prep medication because she states she has recently moved and cannot locate the previous prescription. RN confirmed that patient will not have taken an injection of patient's GLP-1 for a full week prior to the procedure. Patient states that her last dose was 12/28/23.   RN provided new instructions and sent them through My Chart as patient requested. RN also sent a new prescription for prep medication to patient's preferred pharmacy.  All patient questions were answered; patient stated understanding.

## 2024-01-02 NOTE — Telephone Encounter (Signed)
Patient needing updated prep instructions. Please advise.

## 2024-01-02 NOTE — Telephone Encounter (Signed)
 See previous note

## 2024-01-06 ENCOUNTER — Ambulatory Visit: Admitting: Gastroenterology

## 2024-01-06 ENCOUNTER — Encounter: Payer: Self-pay | Admitting: Gastroenterology

## 2024-01-06 VITALS — BP 136/74 | HR 104 | Temp 98.6°F | Resp 14 | Ht 72.0 in | Wt 250.0 lb

## 2024-01-06 DIAGNOSIS — Z1211 Encounter for screening for malignant neoplasm of colon: Secondary | ICD-10-CM

## 2024-01-06 MED ORDER — FLEET ENEMA RE ENEM
1.0000 | ENEMA | Freq: Once | RECTAL | Status: AC
  Administered 2024-01-06: 1 via RECTAL

## 2024-01-06 MED ORDER — SODIUM CHLORIDE 0.9 % IV SOLN: 500.0000 mL | Freq: Once | INTRAVENOUS | Status: DC

## 2024-01-06 NOTE — Patient Instructions (Signed)
 YOU HAD AN ENDOSCOPIC PROCEDURE TODAY AT THE [facility]:   Refer to the procedure report that was given to you for any specific questions about what was found during the examination.  If the procedure report does not answer your questions, please call your gastroenterologist to clarify.  If you requested that your care partner not be given the details of your procedure findings, then the procedure report has been included in a sealed envelope for you to review at your convenience later.  YOU SHOULD EXPECT: Some feelings of bloating in the abdomen. Passage of more gas than usual.  Walking can help get rid of the air that was put into your GI tract during the procedure and reduce the bloating. If you had a lower endoscopy (such as a colonoscopy or flexible sigmoidoscopy) you may notice spotting of blood in your stool or on the toilet paper. If you underwent a bowel prep for your procedure, you may not have a normal bowel movement for a few days.  Please Note:  You might notice some irritation and congestion in your nose or some drainage.  This is from the oxygen used during your procedure.  There is no need for concern and it should clear up in a day or so.  SYMPTOMS TO REPORT IMMEDIATELY:   2 month recall put in to reschedule colonoscopy; please follow up  Resume previous diet Continue present medications You will need a double prep for next attempt Take Miralax  three times daily in addition to bowel regimen for chronic constipation, especially leading up to next attempt at prep     For urgent or emergent issues, a gastroenterologist can be reached at any hour by calling [phone]. Do not use MyChart messaging for urgent concerns.    DIET:  We do recommend a small meal at first, but then you may proceed to your regular diet.  Drink plenty of fluids but you should avoid alcoholic beverages for 24 hours.  ACTIVITY:  You should plan to take it easy for the rest of today and you  should NOT DRIVE or use heavy machinery until tomorrow (because of the sedation medicines used during the test).    FOLLOW UP: Our staff will call the number listed on your records the next business day following your procedure.  We will call around 7:15- 8:00 am to check on you and address any questions or concerns that you may have regarding the information given to you following your procedure. If we do not reach you, we will leave a message.     If any biopsies were taken you will be contacted by phone or by letter within the next 1-3 weeks.  Please call us  at [phone] if you have not heard about the biopsies in 3 weeks.    SIGNATURES/CONFIDENTIALITY: You and/or your care partner have signed paperwork which will be entered into your electronic medical record.  These signatures attest to the fact that that the information above on your After Visit Summary has been reviewed and is understood.  Full responsibility of the confidentiality of this discharge information lies with you and/or your care-partner.

## 2024-01-06 NOTE — Progress Notes (Signed)
 Patient requested no anesthesia. Questionable prep. Dr. ODESSIA patient decided they desired to attempt procedure.  Procedure started, then stopped 2/2 poor prep. Patient taken to PACU & report given to RN.

## 2024-01-06 NOTE — Progress Notes (Deleted)
 Pt's states no medical or surgical changes since previsit or office visit.

## 2024-01-06 NOTE — Progress Notes (Signed)
 Pt took Miralax  17g daily for 4 days. Reports that she did not have any results from either dose of Suprep and so she gave herself a fleet enema this morning. Reported that after the enema she had liquid come out but she did not look at it. Prior to starting prep her last BM's were Thursday and Friday and they were solid. Discussed with MD. Fleet enema given to assess stool. Results were brown stool, no solid pieces but did not appear fully watery. MD discussed with pt, pt requested to proceed with procedure to see if MD could get adequate visualization for a complete exam. Pt understands that procedure may have to be aborted if there is any solid stool. Pt verbalized understanding.

## 2024-01-06 NOTE — Progress Notes (Signed)
 [facility] History and Physical   Primary Care Physician:  Alba Sharper, MD   Reason for Procedure:   Colon cancer screening  Plan:    colonoscopy   HPI: [name] is a 53 y.o. female  here for colonoscopy screening - she has not had an exam in many years. She has chronic constipation. Brother has had colon polyps. Otherwise feels well without any cardiopulmonary symptoms. She was supposed to be a double prep but was only given a single prep to take. She had an enema and has liquid stool, unclear how well we will see but she strongly wishes to try it today. She declines sedation, wants to do this awake.  I have discussed risks / benefits of anesthesia and endoscopic procedure with Sonny JAYSON Holt and they wish to proceed with the exams as outlined today.    Past Medical History:  Diagnosis Date   Anxiety    Cataract    DEPRESSION/ANXIETY 08/08/2009   Diabetes mellitus without complication (HCC)    Hyperlipidemia    IBS 02/02/2010   Neuropathy    Polyneuropathy 12/04/2010   Renal stones    Sleep apnea 05/2021   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) 08/08/2009    Past Surgical History:  Procedure Laterality Date   ABDOMINOPLASTY     CHOLECYSTECTOMY      Prior to Admission medications   Medication Sig Start Date End Date Taking? Authorizing Provider  Na Sulfate-K Sulfate-Mg Sulfate concentrate (SUPREP) 17.5-3.13-1.6 GM/177ML SOLN  01/02/24  Yes [provider]  Accu-Chek Softclix Lancets lancets Use as instructed 05/03/22   Anders Otto DASEN, MD  aspirin  81 MG tablet Take 81 mg by mouth daily.    [provider]  atorvastatin  (LIPITOR) 80 MG tablet TAKE 1 TABLET(80 MG) BY MOUTH DAILY 05/24/23   Joshua Domino, DO  clonazePAM  (KLONOPIN ) 0.5 MG tablet TAKE 3 TABLETS BY MOUTH AT BEDTIME AS NEEDED 09/20/23   Debby Fidela CROME, NP  Continuous Glucose Sensor  (FREESTYLE LIBRE 3 PLUS SENSOR) MISC Change sensor every 15 days. 01/29/23   McDiarmid, Krystal BIRCH, MD  enalapril  (VASOTEC ) 10 MG tablet TAKE 1 TABLET BY MOUTH DAILY 09/11/23   Joshua Domino, DO  fenofibrate  (TRICOR ) 145 MG tablet TAKE 1 TABLET(145 MG) BY MOUTH DAILY 08/20/23   Joshua Domino, DO  glucose blood (ACCU-CHEK GUIDE TEST) test strip USE AS DIRECTED THREE TIMES DAILY 06/13/23   Orlando Pond, DO  Insulin  Pen Needle (BD PEN NEEDLE NANO 2ND GEN) 32G X 4 MM MISC USE TO INJECT INSULIN  DAILY Strength:32G X 4 MM 04/09/23   Joshua Domino, DO  Lancets Misc. (ACCU-CHEK SOFTCLIX LANCET DEV) KIT 1 applicator by Does not apply route in the morning, at noon, and at bedtime. 05/12/19   Mullis, Kiersten P, DO  LANTUS  SOLOSTAR 100 UNIT/ML Solostar Pen ADMINISTER 34 UNITS UNDER THE SKIN INTO SKIN DAILY 07/22/23   Joshua Domino, DO  metFORMIN  (GLUCOPHAGE ) 1000 MG tablet TAKE 1 TABLET(1000 MG) BY MOUTH TWICE DAILY WITH A MEAL 08/20/23   Joshua Domino, DO  morphine  (MS CONTIN ) 30 MG 12 hr tablet Take 1 tablet (30 mg total) by mouth every 12 (twelve) hours. 12/11/23   Debby Fidela CROME, NP  morphine  (MSIR) 30 MG tablet One tablet at HS, may take a tablet during the day. 12/11/23   Debby Fidela CROME, NP  Multiple Vitamin (MULTIVITAMIN) tablet Take 1 tablet by mouth daily.    [provider]  mupirocin  ointment (BACTROBAN ) 2 % Apply 1 Application topically 2 (two) times daily. Patient taking differently: Apply 1 Application topically daily as needed (foot ulcers). 06/04/22   Sikora, Rebecca, DPM  nortriptyline  (PAMELOR ) 50 MG capsule TAKE 1 CAPSULE(50 MG) BY MOUTH AT BEDTIME 05/15/23   Debby Fidela CROME, NP  OZEMPIC , 2 MG/DOSE, 8 MG/3ML SOPN INJECT 2MG  INTO SKINONCE A WEEK 07/23/23   Joshua Domino, DO  PARoxetine  (PAXIL ) 40 MG tablet Take 1 tablet (40 mg total) by mouth daily. 10/15/23   Alba Sharper, MD  tiZANidine  (ZANAFLEX ) 2 MG tablet TAKE 1 TABLET(2 MG) BY MOUTH THREE TIMES DAILY 12/10/23   Debby Fidela L, NP  VITAMIN  D, CHOLECALCIFEROL, PO Take 1 capsule by mouth daily.    [provider]    Current Outpatient Medications  Medication Sig Dispense Refill   Na Sulfate-K Sulfate-Mg Sulfate concentrate (SUPREP) 17.5-3.13-1.6 GM/177ML SOLN      Accu-Chek Softclix Lancets lancets Use as instructed 100 each 12   aspirin  81 MG tablet Take 81 mg by mouth daily.     atorvastatin  (LIPITOR) 80 MG tablet TAKE 1 TABLET(80 MG) BY MOUTH DAILY 90 tablet 3   clonazePAM  (KLONOPIN ) 0.5 MG tablet TAKE 3 TABLETS BY MOUTH AT BEDTIME AS NEEDED 90 tablet 3   Continuous Glucose Sensor (FREESTYLE LIBRE 3 PLUS SENSOR) MISC Change sensor every 15 days. 2 each 11   enalapril  (VASOTEC ) 10 MG tablet TAKE 1 TABLET BY MOUTH DAILY 90 tablet 0   fenofibrate  (TRICOR ) 145 MG tablet TAKE 1 TABLET(145 MG) BY MOUTH DAILY 90 tablet 2   glucose blood (ACCU-CHEK GUIDE TEST) test strip USE AS DIRECTED THREE TIMES DAILY 100 strip 5   Insulin  Pen Needle (BD PEN NEEDLE NANO 2ND GEN) 32G X 4 MM MISC USE TO INJECT INSULIN  DAILY Strength:32G X 4 MM 100 each 11   Lancets Misc. (ACCU-CHEK SOFTCLIX LANCET DEV) KIT 1 applicator by Does not apply route in the morning, at noon, and at bedtime. 1 kit 1   LANTUS  SOLOSTAR 100 UNIT/ML Solostar Pen ADMINISTER 34 UNITS UNDER THE SKIN INTO SKIN DAILY 15 mL 3   metFORMIN  (GLUCOPHAGE ) 1000 MG tablet TAKE 1 TABLET(1000 MG) BY MOUTH TWICE DAILY WITH A MEAL 180 tablet 2   morphine  (MS CONTIN ) 30 MG 12 hr tablet Take 1 tablet (30 mg total) by mouth every 12 (twelve) hours. 60 tablet 0   morphine  (MSIR) 30 MG tablet One tablet at HS, may take a tablet during the day. 60 tablet 0   Multiple Vitamin (MULTIVITAMIN) tablet Take 1 tablet by mouth daily.     mupirocin  ointment (BACTROBAN ) 2 % Apply 1 Application topically 2 (two) times daily. (Patient taking differently: Apply 1 Application topically daily as needed (foot ulcers).) 30 g 2   nortriptyline  (PAMELOR ) 50 MG capsule TAKE 1 CAPSULE(50 MG) BY MOUTH AT BEDTIME 90  capsule 3   OZEMPIC , 2 MG/DOSE, 8 MG/3ML SOPN INJECT 2MG  INTO SKINONCE A WEEK 3 mL 11   PARoxetine  (PAXIL ) 40 MG tablet Take 1 tablet (40 mg total) by mouth daily. 90 tablet 0   tiZANidine  (ZANAFLEX ) 2 MG tablet TAKE 1 TABLET(2 MG) BY MOUTH THREE TIMES DAILY 270 tablet 1   VITAMIN D , CHOLECALCIFEROL, PO Take 1 capsule by mouth daily.     Current Facility-Administered Medications  Medication Dose Route Frequency Provider Last Rate Last Admin   0.9 %  sodium chloride  infusion  500 mL Intravenous Once Jaelynne Hockley, Elspeth SQUIBB, MD  Allergies as of 01/06/2024 - Review Complete 01/06/2024  Allergen Reaction Noted   Linezolid  Shortness Of Breath 05/31/2023   Vancomycin  Other (See Comments) 08/08/2009   Doxycycline  hyclate Other (See Comments) 05/31/2023    Family History  Problem Relation Age of Onset   Stroke Mother    Fibromyalgia Mother    Aortic aneurysm Mother    Stroke Father    Colon polyps Brother    Stroke Maternal Grandmother    Colon cancer Maternal Grandmother    Heart Problems Paternal Grandmother    Dementia Paternal Grandmother    Heart attack Paternal Grandfather    Esophageal cancer Neg Hx    Rectal cancer Neg Hx    Stomach cancer Neg Hx     Social History   Socioeconomic History   Marital status: Legally Separated    Spouse name: Not on file   Number of children: 1   Years of education: Not on file   Highest education level: Bachelor's degree (e.g., BA, AB, BS)  Occupational History   Occupation: Disabled  Tobacco Use   Smoking status: Never   Smokeless tobacco: Never  Vaping Use   Vaping status: Never Used  Substance and Sexual Activity   Alcohol  use: No    Alcohol /week: 0.0 standard drinks of alcohol    Drug use: No    Comment: rx opiods   Sexual activity: Yes    Birth control/protection: Condom  Other Topics Concern   Not on file  Social History Narrative   Not on file   Social Drivers of Health   Financial Resource Strain: High Risk  (03/14/2023)   Overall Financial Resource Strain (CARDIA)    Difficulty of Paying Living Expenses: Very hard  Food Insecurity: No Food Insecurity (05/31/2023)   Hunger Vital Sign    Worried About Running Out of Food in the Last Year: Never true    Ran Out of Food in the Last Year: Never true  Recent Concern: Food Insecurity - Food Insecurity Present (03/14/2023)   Hunger Vital Sign    Worried About Running Out of Food in the Last Year: Often true    Ran Out of Food in the Last Year: Often true  Transportation Needs: No Transportation Needs (05/31/2023)   PRAPARE - Administrator, Civil Service (Medical): No    Lack of Transportation (Non-Medical): No  Physical Activity: Inactive (03/14/2023)   Exercise Vital Sign    Days of Exercise per Week: 1 day    Minutes of Exercise per Session: 0 min  Stress: Stress Concern Present (03/14/2023)   [employer] of Occupational Health - Occupational Stress Questionnaire    Feeling of Stress : Very much  Social Connections: Socially Isolated (03/14/2023)   Social Connection and Isolation Panel    Frequency of Communication with Friends and Family: Once a week    Frequency of Social Gatherings with Friends and Family: Once a week    Attends Religious Services: Never    Database administrator or Organizations: No    Attends Engineer, structural: Not on file    Marital Status: Separated  Intimate Partner Violence: Not At Risk (05/31/2023)   Humiliation, Afraid, Rape, and Kick questionnaire    Fear of Current or Ex-Partner: No    Emotionally Abused: No    Physically Abused: No    Sexually Abused: No    Review of Systems: All other review of systems negative except as mentioned in the HPI.  Physical Exam: Vital  signs BP 139/89   Pulse (!) 112   Temp 98.6 F (37 C) (Temporal)   Ht 6' (1.829 m)   Wt 250 lb (113.4 kg)   LMP 05/06/2018   SpO2 93%   BMI 33.91 kg/m   General:   Alert,  Well-developed, pleasant and  cooperative in NAD Lungs:  Clear throughout to auscultation.   Heart:  Regular rate and rhythm Abdomen:  Soft, nontender and nondistended.   Neuro/Psych:  Alert and cooperative. Normal mood and affect. A and O x 3  Marcey Naval, MD [facility]

## 2024-01-06 NOTE — Op Note (Signed)
  [facility] Patient Name: [name] Procedure Date: 01/06/2024 3:39 PM MRN: [id number] Endoscopist: Elspeth P. Leigh , MD, [id number] Age: 53 Referring MD:  Date of Birth: 1970/05/03 Gender: Female Account #: [id number] Procedure:                Colonoscopy Indications:              Screening for colorectal malignant neoplasm -                            chronic constipation. Should have had a double prep                            but unfortunately was given only a single prep Medicines:                None Procedure:                Pre-Anesthesia Assessment:                           - Prior to the procedure, a History and Physical                            was performed, and patient medications and                            allergies were reviewed. The patient's tolerance of                            previous anesthesia was also reviewed. The risks                            and benefits of the procedure and the sedation                            options and risks were discussed with the patient.                            All questions were answered, and informed consent                            was obtained. Prior Anticoagulants: The patient has                            taken no anticoagulant or antiplatelet agents. ASA                            Grade Assessment: III - A patient with severe                            systemic disease. After reviewing the risks and                            benefits, the patient was deemed in satisfactory  condition to undergo the procedure.                           After obtaining informed consent, the colonoscope                            was passed under direct vision. Throughout the                            procedure, the patient's blood pressure, pulse, and                            oxygen saturations were monitored continuously. The                            Olympus Scope SN  [id number] was introduced through the                            anus with the intention of advancing to the cecum.                            The scope was advanced to the rectum before the                            procedure was aborted. Medications were given. The                            colonoscopy was performed without difficulty. The                            patient tolerated the procedure well. The quality                            of the bowel preparation was poor. The rectum was                            photographed. Scope In: 4:25:24 PM Scope Out: 4:27:22 PM Total Procedure Duration: 0 hours 1 minute 58 seconds  Findings:                 The perianal and digital rectal examinations were                            normal.                           A large amount of solid stool was found in the                            recto-sigmoid colon, precluding visualization. The                            procedure was aborted. Complications:            No immediate complications. Estimated blood loss:  None. Estimated Blood Loss:     Estimated blood loss: none. Impression:               - Preparation of the colon was poor.                           - Stool in the recto-sigmoid colon - aborted Recommendation:           - Patient has a contact number available for                            emergencies. The signs and symptoms of potential                            delayed complications were discussed with the                            patient. Return to normal activities tomorrow.                            Written discharge instructions were provided to the                            patient.                           - Resume previous diet.                           - Continue present medications.                           - Repeat colonoscopy because the bowel preparation                            was suboptimal. Patient needs a double prep for                             next attempt                           - Take Miralax  three times daily in addition to                            bowel regimen for chronic constipation, especially                            leading up to next attempt at prep Elspeth P. Catheline Hixon, MD 01/06/2024 4:33:34 PM This report has been signed electronically.

## 2024-01-07 ENCOUNTER — Telehealth: Payer: Self-pay

## 2024-01-07 NOTE — Telephone Encounter (Signed)
Attempted to reach patient for post-procedure f/u call. No answer. Left message for her to please not hesitate to call if she has any questions/concerns regarding her care. 

## 2024-01-17 NOTE — Progress Notes (Signed)
 Subjective:    Patient ID: [name], female    DOB: 1970-11-08, 53 y.o.   MRN: [id number]  HPI: [name] is a 53 y.o. female who returns for follow up appointment for chronic pain and medication refill. She states her pain is located in her  bilateral hands and bilateral feet with tingling and burning. She rates her pain 7. Her current exercise regime is walking and performing stretching exercises.  Ms. Wallner Morphine  equivalent is 120.00 MME. She is also prescribed Clonazepam  .We have discussed the black box warning of using opioids and benzodiazepines. I highlighted the dangers of using these drugs together and discussed the adverse events including respiratory suppression, overdose, cognitive impairment and importance of compliance with current regimen. We will continue to monitor and adjust as indicated.    Oral Swab was Performed today.    Pain Inventory Average Pain 6 Pain Right Now 7 My pain is constant, sharp, burning, dull, stabbing, tingling, and aching  In the last 24 hours, has pain interfered with the following? General activity 9 Relation with others 8 Enjoyment of life 8 What TIME of day is your pain at its worst? varies Sleep (in general) Poor  Pain is worse with: walking, bending, standing, and some activites Pain improves with: rest, medication, and massage Relief from Meds: 8  Family History  Problem Relation Age of Onset   Stroke Mother    Fibromyalgia Mother    Aortic aneurysm Mother    Stroke Father    Colon polyps Brother    Stroke Maternal Grandmother    Colon cancer Maternal Grandmother    Heart Problems Paternal Grandmother    Dementia Paternal Grandmother    Heart attack Paternal Grandfather    Esophageal cancer Neg Hx    Rectal cancer Neg Hx    Stomach cancer Neg Hx    Social History   Socioeconomic History   Marital status: Legally Separated    Spouse name: Not on file   Number of children: 1   Years of  education: Not on file   Highest education level: Bachelor's degree (e.g., BA, AB, BS)  Occupational History   Occupation: Disabled  Tobacco Use   Smoking status: Never   Smokeless tobacco: Never  Vaping Use   Vaping status: Never Used  Substance and Sexual Activity   Alcohol  use: No    Alcohol /week: 0.0 standard drinks of alcohol    Drug use: No    Comment: rx opiods   Sexual activity: Yes    Birth control/protection: Condom  Other Topics Concern   Not on file  Social History Narrative   Not on file   Social Drivers of Health   Financial Resource Strain: High Risk (03/14/2023)   Overall Financial Resource Strain (CARDIA)    Difficulty of Paying Living Expenses: Very hard  Food Insecurity: No Food Insecurity (05/31/2023)   Hunger Vital Sign    Worried About Running Out of Food in the Last Year: Never true    Ran Out of Food in the Last Year: Never true  Recent Concern: Food Insecurity - Food Insecurity Present (03/14/2023)   Hunger Vital Sign    Worried About Running Out of Food in the Last Year: Often true    Ran Out of Food in the Last Year: Often true  Transportation Needs: No Transportation Needs (05/31/2023)   PRAPARE - Administrator, Civil Service (Medical): No    Lack of Transportation (Non-Medical): No  Physical Activity:  Inactive (03/14/2023)   Exercise Vital Sign    Days of Exercise per Week: 1 day    Minutes of Exercise per Session: 0 min  Stress: Stress Concern Present (03/14/2023)   [employer] of Occupational Health - Occupational Stress Questionnaire    Feeling of Stress : Very much  Social Connections: Socially Isolated (03/14/2023)   Social Connection and Isolation Panel    Frequency of Communication with Friends and Family: Once a week    Frequency of Social Gatherings with Friends and Family: Once a week    Attends Religious Services: Never    Database Administrator or Organizations: No    Attends Engineer, Structural: Not on  file    Marital Status: Separated   Past Surgical History:  Procedure Laterality Date   ABDOMINOPLASTY     CHOLECYSTECTOMY     COLONOSCOPY     Past Surgical History:  Procedure Laterality Date   ABDOMINOPLASTY     CHOLECYSTECTOMY     COLONOSCOPY     Past Medical History:  Diagnosis Date   Anxiety    Cataract    DEPRESSION/ANXIETY 08/08/2009   Diabetes mellitus without complication (HCC)    GERD (gastroesophageal reflux disease)    Hyperlipidemia    high triglycerides   IBS 02/02/2010   Neuropathy    Polyneuropathy 12/04/2010   Renal stones    Sleep apnea 05/2021   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) 08/08/2009   LMP 05/06/2018   Opioid Risk Score:   Fall Risk Score:  `1  Depression screen [facility] 2/9     10/15/2023    3:07 PM 08/22/2023    2:55 PM 06/28/2023    3:08 PM 06/17/2023    2:27 PM 05/31/2023    1:33 PM 03/21/2023    2:40 PM 03/15/2023    3:19 PM  Depression screen PHQ 2/9  Decreased Interest 0 0 1 1 1  0 2  Down, Depressed, Hopeless 0 0 1 1 1  0 2  PHQ - 2 Score 0 0 2 2 2  0 4  Altered sleeping    3 3  3   Tired, decreased energy    3 3  3   Change in appetite    0 0  0  Feeling bad or failure about yourself     3 1  2   Trouble concentrating    1 0  1  Moving slowly or fidgety/restless    0 0  0  Suicidal thoughts    0 0  0  PHQ-9 Score    12 9  13     Review of Systems  Musculoskeletal:        Pain below the both calves down to toes of both feet, pain in both hands  All other systems reviewed and are negative.      Objective:   Physical Exam Vitals and nursing note reviewed.  Constitutional:      Appearance: Normal appearance.  Cardiovascular:     Rate and Rhythm: Normal rate and regular rhythm.     Pulses: Normal pulses.     Heart sounds: Normal heart sounds.  Pulmonary:     Effort: Pulmonary effort is normal.     Breath sounds: Normal breath sounds.   Musculoskeletal:     Comments: Normal Muscle Bulk and Muscle Testing Reveals:  Upper Extremities: Full ROM and Muscle Strength 5/5 Lower Extremities: Full ROM and Muscle Strength 5/5 Arises from Table  slowly Narrow Based  Gait     Skin:    General: Skin is warm and dry.  Neurological:     Mental Status: She is alert and oriented to person, place, and time.  Psychiatric:        Mood and Affect: Mood normal.        Behavior: Behavior normal.          Assessment & Plan:  1.Severe peripheral polyneuropathy of undetermined etiology.: 01/20/2024 Refilled: MS Contin  30 mg one tablet every 12 hours #60 and  MSIR 30 mg one tablet at HS, may take a tablet during the day as needed #60.We will continue the opioid monitoring program, this consists of regular clinic visits, examinations, urine drug screen, pill counts as well as use of [place]  Controlled Substance Reporting system. A 12 month History has been reviewed on the [place]  Controlled Substance Reporting System on 01/20/2024 2. Bilateral progressive upper extremity hand pain/ Polyneuropathy. Continue with current medication regime. Tight Glucose Control. Adhere to healthy diet regime. Continue with current medication regimen with nortriptyline : 01/20/2024 3. Muscle Spasms: Continue current medication regimen with  Zanaflex . Continue to Monitor.  01/20/2024 4. Depression/Anxiety: Continue current medication regimen with Paxil  and  Klonopin . Continue to Monitor. 01/20/2024  5. Left Foot Wound: No complaints today. Wound Care Following. Continue to Monitor. 01/20/2024   F/U in 1 month

## 2024-01-20 ENCOUNTER — Encounter: Payer: Self-pay | Admitting: Registered Nurse

## 2024-01-20 ENCOUNTER — Encounter: Attending: Physical Medicine & Rehabilitation | Admitting: Registered Nurse

## 2024-01-20 VITALS — BP 125/68 | HR 100 | Ht 72.0 in | Wt 257.0 lb

## 2024-01-20 DIAGNOSIS — Z79891 Long term (current) use of opiate analgesic: Secondary | ICD-10-CM | POA: Insufficient documentation

## 2024-01-20 DIAGNOSIS — G894 Chronic pain syndrome: Secondary | ICD-10-CM | POA: Insufficient documentation

## 2024-01-20 DIAGNOSIS — Z794 Long term (current) use of insulin: Secondary | ICD-10-CM | POA: Diagnosis not present

## 2024-01-20 DIAGNOSIS — F341 Dysthymic disorder: Secondary | ICD-10-CM | POA: Insufficient documentation

## 2024-01-20 DIAGNOSIS — E1140 Type 2 diabetes mellitus with diabetic neuropathy, unspecified: Secondary | ICD-10-CM | POA: Diagnosis not present

## 2024-01-20 DIAGNOSIS — Z5181 Encounter for therapeutic drug level monitoring: Secondary | ICD-10-CM | POA: Diagnosis not present

## 2024-01-20 MED ORDER — CLONAZEPAM 0.5 MG PO TABS: ORAL_TABLET | ORAL | 3 refills | Status: DC

## 2024-01-20 MED ORDER — MORPHINE SULFATE 30 MG PO TABS: ORAL_TABLET | ORAL | 0 refills | Status: DC

## 2024-01-20 MED ORDER — MORPHINE SULFATE ER 30 MG PO TBCR: 30.0000 mg | EXTENDED_RELEASE_TABLET | Freq: Two times a day (BID) | ORAL | 0 refills | Status: DC

## 2024-01-21 ENCOUNTER — Other Ambulatory Visit: Payer: Self-pay

## 2024-01-21 DIAGNOSIS — E118 Type 2 diabetes mellitus with unspecified complications: Secondary | ICD-10-CM

## 2024-01-22 ENCOUNTER — Ambulatory Visit: Admitting: Obstetrics and Gynecology

## 2024-01-22 MED ORDER — LANTUS SOLOSTAR 100 UNIT/ML ~~LOC~~ SOPN: 34.0000 [IU] | PEN_INJECTOR | Freq: Every day | SUBCUTANEOUS | 0 refills | Status: DC

## 2024-01-23 LAB — DRUG TOX MONITOR 1 W/CONF, ORAL FLD
AMINOCLONAZEPAM: 1.73 ng/mL — ABNORMAL HIGH (ref <0.50)
Alprazolam: NEGATIVE ng/mL (ref <0.50)
Amphetamines: NEGATIVE ng/mL (ref <10)
Barbiturates: NEGATIVE ng/mL (ref <10)
Benzodiazepines: POSITIVE ng/mL — AB (ref <0.50)
Buprenorphine: NEGATIVE ng/mL (ref <0.10)
Chlordiazepoxide: NEGATIVE ng/mL (ref <0.50)
Clonazepam: NEGATIVE ng/mL (ref <0.50)
Cocaine: NEGATIVE ng/mL (ref <5.0)
Codeine: NEGATIVE ng/mL (ref <2.5)
Diazepam: NEGATIVE ng/mL (ref <0.50)
Dihydrocodeine: NEGATIVE ng/mL (ref <2.5)
Fentanyl: NEGATIVE ng/mL (ref <0.10)
Flunitrazepam: NEGATIVE ng/mL (ref <0.50)
Flurazepam: NEGATIVE ng/mL (ref <0.50)
Heroin Metabolite: NEGATIVE ng/mL (ref <1.0)
Hydrocodone: NEGATIVE ng/mL (ref <2.5)
Hydromorphone: NEGATIVE ng/mL (ref <2.5)
Lorazepam: NEGATIVE ng/mL (ref <0.50)
MARIJUANA: NEGATIVE ng/mL (ref <2.5)
MDMA: NEGATIVE ng/mL (ref <10)
Meprobamate: NEGATIVE ng/mL (ref <2.5)
Methadone: NEGATIVE ng/mL (ref <5.0)
Midazolam: NEGATIVE ng/mL (ref <0.50)
Morphine: 27.8 ng/mL — ABNORMAL HIGH (ref <2.5)
Nicotine Metabolite: NEGATIVE ng/mL (ref <5.0)
Nordiazepam: NEGATIVE ng/mL (ref <0.50)
Norhydrocodone: NEGATIVE ng/mL (ref <2.5)
Noroxycodone: NEGATIVE ng/mL (ref <2.5)
Opiates: POSITIVE ng/mL — AB (ref <2.5)
Oxazepam: NEGATIVE ng/mL (ref <0.50)
Oxycodone: NEGATIVE ng/mL (ref <2.5)
Oxymorphone: NEGATIVE ng/mL (ref <2.5)
Phencyclidine: NEGATIVE ng/mL (ref <10)
Tapentadol: NEGATIVE ng/mL (ref <5.0)
Temazepam: NEGATIVE ng/mL (ref <0.50)
Tramadol: NEGATIVE ng/mL (ref <5.0)
Triazolam: NEGATIVE ng/mL (ref <0.50)
Zolpidem: NEGATIVE ng/mL (ref <5.0)

## 2024-01-23 LAB — DRUG TOX ALC METAB W/CON, ORAL FLD: Alcohol Metabolite: NEGATIVE ng/mL (ref <25)

## 2024-01-28 ENCOUNTER — Other Ambulatory Visit: Payer: Self-pay | Admitting: Family Medicine

## 2024-01-28 ENCOUNTER — Other Ambulatory Visit: Payer: Self-pay

## 2024-01-28 DIAGNOSIS — I152 Hypertension secondary to endocrine disorders: Secondary | ICD-10-CM

## 2024-01-28 MED ORDER — ENALAPRIL MALEATE 10 MG PO TABS: 10.0000 mg | ORAL_TABLET | Freq: Every day | ORAL | 0 refills | Status: DC

## 2024-01-30 ENCOUNTER — Other Ambulatory Visit: Payer: Self-pay | Admitting: Family Medicine

## 2024-01-31 ENCOUNTER — Encounter: Payer: Self-pay | Admitting: Family Medicine

## 2024-02-03 ENCOUNTER — Ambulatory Visit

## 2024-02-04 ENCOUNTER — Ambulatory Visit (INDEPENDENT_AMBULATORY_CARE_PROVIDER_SITE_OTHER): Admitting: Family Medicine

## 2024-02-04 ENCOUNTER — Ambulatory Visit: Admitting: Family Medicine

## 2024-02-04 VITALS — BP 121/70 | HR 107 | Ht 72.0 in | Wt 264.8 lb

## 2024-02-04 DIAGNOSIS — E118 Type 2 diabetes mellitus with unspecified complications: Secondary | ICD-10-CM | POA: Diagnosis not present

## 2024-02-04 DIAGNOSIS — R42 Dizziness and giddiness: Secondary | ICD-10-CM

## 2024-02-04 DIAGNOSIS — Z23 Encounter for immunization: Secondary | ICD-10-CM

## 2024-02-04 DIAGNOSIS — E781 Pure hyperglyceridemia: Secondary | ICD-10-CM

## 2024-02-04 DIAGNOSIS — I152 Hypertension secondary to endocrine disorders: Secondary | ICD-10-CM | POA: Diagnosis not present

## 2024-02-04 DIAGNOSIS — E1159 Type 2 diabetes mellitus with other circulatory complications: Secondary | ICD-10-CM | POA: Diagnosis not present

## 2024-02-04 NOTE — Progress Notes (Signed)
    SUBJECTIVE:  She is accompanied today by friend who helps with some history.  CHIEF COMPLAINT / HPI:   Dizzy spells She had an episode this past weekend right when she was waking up - 15 sec of room spinning then it stopped. She lay in bed for 20 minutes and it happened again. She felt nauseous during both episodes; does not recall any other symptoms, including heart racing. Her BP at this time was 103/51. Throughout the day afterwards she had little bouts of dizziness.  She has had dizziness all of my life. No recent illnesses, head injuries. Standing has triggered bouts of dizziness before. Has tinnitus, this is not not worsening; saw ENT a few years ago without additional concerns. No vision changes - has eyes checked annually. She is very worried about brain tumors and would like an MRI.  She reports Very severe neuropathy which affects her balance. Echo in 2023 with normal ventricular and valvular function.  T2DM Last A1c 6.7 in Feb 2025.  She is rx'd metformin  1000 mg BID, Ozempic  2 mg weekly, 38 U insulin  daily. She is taking this without issue, no missed doses.  PERTINENT  PMH / PSH: Reviewed.  OBJECTIVE:   BP 121/70   Pulse (!) 107   Ht 5' 6 (1.676 m)   Wt 264 lb 12.8 oz (120.1 kg)   LMP 05/06/2018   SpO2 97%   BMI 42.74 kg/m   General: well-appearing, no acute distress. HEENT: normocephalic, PERRLA, EOM grossly intact, MMM Cardio: Regular rate, regular rhythm, no murmurs on exam. Pulm: Clear, no wheezing, no crackles. No increased work of breathing. Extremities: no peripheral edema. Moves all extremities equally. Psych:  Cognition and judgment appear intact. Alert, communicative, and cooperative with normal attention span and concentration. Neuro: CN II: PERRL CN III, IV,VI: EOMI CV V: Normal sensation in V1, V2, V3 CVII: Symmetric smile and brow raise CN VIII: Normal hearing CN IX,X: Symmetric palate raise  CN XI: 5/5 shoulder shrug CN XII:  Symmetric tongue protrusion  UE and LE strength 5/5 Equal sensation in UE and LE bilaterally    ASSESSMENT/PLAN:   Assessment & Plan Dizziness The most likely diagnosis is dizziness related to vestibular dysfunction.   I have considered and concluded the patient has a very low likelihood of having: CNS causes (CVA, CN8 tumor, Seizures, MS) Vascular structural disease (vertebral artery or aortic dissection) Cardiac structural disease (valvular, tamponade, myxoma, ischemia,) Arrhythmias, or Adrenal insufficiency.  She is on a number of medications which can cause dizziness, including tizanidine , clonazepam . Possibly also orthostatic dizziness given sometimes low pressures on enalapril . I think brain tumor is unlikely, however given progression of symptoms throughout her life it is not impossible. - labs collected today - I recommend vestibular rehabilitation - I discussed Brain MRI and through shared decision making determined we will order this - consider decreasing enalapril  in future if indicated Type 2 diabetes mellitus with complication, without long-term current use of insulin  (HCC) Stable. - A1c today - continue medications as above Hypertriglyceridemia Checking lipid panel today.    Lauraine Norse, DO [facility]

## 2024-02-04 NOTE — Patient Instructions (Addendum)
 It was so good to see you today! Thank you for allowing me to take care of you.  Today we discussed the following concerns and plans:  Dizziness - we got labs today; I will let you know the results when available. - I recommend vestibular rehab PT. They should call you to schedule. - I have also ordered a brain MRI  If you have any concerns, please call the clinic or schedule an appointment.  It was a pleasure to take care of you today. Be well!  Lauraine Norse, DO [facility], PGY-2  Do you need your medications delivered to your home?   We'll send your prescription to the [facility] for delivery.          Address: [address] [place], [place], [place] [address]          Phone: [phone]  Please call the [facility] to speak with a pharmacist and set up your home medication delivery. If you have any questions, feel free to contact us  -- we're happy to help!  Other [facility] that offer affordable prices on both prescriptions and over-the-counter items, as well as convenient services like vaccinations, are  [facility], at [facility]         Address:  [address], [place], [place] [address]         Phone: [phone]  [facility], located in the [facility]        Address: [address] [place], [place] [address]        Phone: [phone]  [facility], at [facility]       Address: [address], [place], [place] [address]       Phone: [phone]  [facility], at [facility]       Address: [address], [place], [place] [address]       Phone: [phone]

## 2024-02-04 NOTE — Assessment & Plan Note (Signed)
Checking lipid panel today. 

## 2024-02-04 NOTE — Assessment & Plan Note (Signed)
 Stable. - A1c today - continue medications as above

## 2024-02-05 ENCOUNTER — Encounter: Payer: Self-pay | Admitting: Family Medicine

## 2024-02-05 ENCOUNTER — Ambulatory Visit: Payer: Self-pay | Admitting: Family Medicine

## 2024-02-05 LAB — CBC
Hematocrit: 45.6 % (ref 34.0–46.6)
Hemoglobin: 14.4 g/dL (ref 11.1–15.9)
MCH: 26.7 pg (ref 26.6–33.0)
MCHC: 31.6 g/dL (ref 31.5–35.7)
MCV: 85 fL (ref 79–97)
Platelets: 317 x10E3/uL (ref 150–450)
RBC: 5.39 x10E6/uL — ABNORMAL HIGH (ref 3.77–5.28)
RDW: 13.8 % (ref 11.7–15.4)
WBC: 6.4 x10E3/uL (ref 3.4–10.8)

## 2024-02-05 LAB — COMPREHENSIVE METABOLIC PANEL WITH GFR
ALT: 28 IU/L (ref 0–32)
AST: 23 IU/L (ref 0–40)
Albumin: 4.5 g/dL (ref 3.8–4.9)
Alkaline Phosphatase: 50 IU/L (ref 49–135)
BUN/Creatinine Ratio: 32 — ABNORMAL HIGH (ref 9–23)
BUN: 21 mg/dL (ref 6–24)
Bilirubin Total: 0.3 mg/dL (ref 0.0–1.2)
CO2: 18 mmol/L — ABNORMAL LOW (ref 20–29)
Calcium: 10.1 mg/dL (ref 8.7–10.2)
Chloride: 99 mmol/L (ref 96–106)
Creatinine, Ser: 0.66 mg/dL (ref 0.57–1.00)
Globulin, Total: 2.5 g/dL (ref 1.5–4.5)
Glucose: 132 mg/dL — ABNORMAL HIGH (ref 70–99)
Potassium: 4.3 mmol/L (ref 3.5–5.2)
Sodium: 138 mmol/L (ref 134–144)
Total Protein: 7 g/dL (ref 6.0–8.5)
eGFR: 104 mL/min/1.73 (ref >59)

## 2024-02-05 LAB — HEMOGLOBIN A1C
Est. average glucose Bld gHb Est-mCnc: 169 mg/dL
Hgb A1c MFr Bld: 7.5 % — ABNORMAL HIGH (ref 4.8–5.6)

## 2024-02-05 LAB — VITAMIN D 25 HYDROXY (VIT D DEFICIENCY, FRACTURES): Vit D, 25-Hydroxy: 28.5 ng/mL — ABNORMAL LOW (ref 30.0–100.0)

## 2024-02-05 LAB — LIPID PANEL
Chol/HDL Ratio: 3.5 ratio (ref 0.0–4.4)
Cholesterol, Total: 121 mg/dL (ref 100–199)
HDL: 35 mg/dL — ABNORMAL LOW (ref >39)
LDL Chol Calc (NIH): 43 mg/dL (ref 0–99)
Triglycerides: 283 mg/dL — ABNORMAL HIGH (ref 0–149)
VLDL Cholesterol Cal: 43 mg/dL — ABNORMAL HIGH (ref 5–40)

## 2024-02-05 LAB — IRON,TIBC AND FERRITIN PANEL
Ferritin: 42 ng/mL (ref 15–150)
Iron Saturation: 13 % — ABNORMAL LOW (ref 15–55)
Iron: 62 ug/dL (ref 27–159)
Total Iron Binding Capacity: 467 ug/dL — ABNORMAL HIGH (ref 250–450)
UIBC: 405 ug/dL (ref 131–425)

## 2024-02-05 LAB — TSH: TSH: 1.9 u[IU]/mL (ref 0.450–4.500)

## 2024-02-05 LAB — VITAMIN B12: Vitamin B-12: 706 pg/mL (ref 232–1245)

## 2024-02-10 ENCOUNTER — Ambulatory Visit: Admitting: Dermatology

## 2024-02-10 ENCOUNTER — Encounter: Payer: Self-pay | Admitting: Dermatology

## 2024-02-13 ENCOUNTER — Encounter: Attending: Physical Medicine & Rehabilitation | Admitting: Registered Nurse

## 2024-02-13 ENCOUNTER — Encounter: Payer: Self-pay | Admitting: Registered Nurse

## 2024-02-13 VITALS — BP 139/83 | HR 96 | Wt 264.0 lb

## 2024-02-13 DIAGNOSIS — Z5181 Encounter for therapeutic drug level monitoring: Secondary | ICD-10-CM | POA: Insufficient documentation

## 2024-02-13 DIAGNOSIS — Z794 Long term (current) use of insulin: Secondary | ICD-10-CM | POA: Diagnosis not present

## 2024-02-13 DIAGNOSIS — Z79891 Long term (current) use of opiate analgesic: Secondary | ICD-10-CM | POA: Insufficient documentation

## 2024-02-13 DIAGNOSIS — G894 Chronic pain syndrome: Secondary | ICD-10-CM | POA: Insufficient documentation

## 2024-02-13 DIAGNOSIS — E1140 Type 2 diabetes mellitus with diabetic neuropathy, unspecified: Secondary | ICD-10-CM | POA: Insufficient documentation

## 2024-02-13 DIAGNOSIS — F341 Dysthymic disorder: Secondary | ICD-10-CM | POA: Diagnosis not present

## 2024-02-13 MED ORDER — MORPHINE SULFATE ER 30 MG PO TBCR: 30.0000 mg | EXTENDED_RELEASE_TABLET | Freq: Two times a day (BID) | ORAL | 0 refills | Status: DC

## 2024-02-13 MED ORDER — MORPHINE SULFATE 30 MG PO TABS: ORAL_TABLET | ORAL | 0 refills | Status: DC

## 2024-02-13 NOTE — Progress Notes (Signed)
 Subjective:    Patient ID: [name], female    DOB: June 12, 1970, 53 y.o.   MRN: [id number]  HPI: [name] is a 53 y.o. female who returns for follow up appointment for chronic pain and medication refill. She states her pain is located in her bilateral hands and bilateral feet with tingling and burning. She rates her pain 8. Her current exercise regime is walking and performing stretching exercises.  [name] Morphine  equivalent is 120,00 MME. She is also prescribed Clonazepam  .We have discussed the black box warning of using opioids and benzodiazepines. I highlighted the dangers of using these drugs together and discussed the adverse events including respiratory suppression, overdose, cognitive impairment and importance of compliance with current regimen. We will continue to monitor and adjust as indicated.   Last Oral Swab was Performed on 01/20/2024, it was consistent.    Pain Inventory Average Pain 6-7 Pain Right Now 8 My pain is constant, sharp, burning, dull, stabbing, tingling, and aching  In the last 24 hours, has pain interfered with the following? General activity 8 Relation with others 8 Enjoyment of life 8 What TIME of day is your pain at its worst? varies Sleep (in general) Poor  Pain is worse with: walking, bending, unsure, and some activites Pain improves with: rest and medication Relief from Meds: 7-8  Family History  Problem Relation Age of Onset   Stroke Mother    Fibromyalgia Mother    Aortic aneurysm Mother    Stroke Father    Colon polyps Brother    Stroke Maternal Grandmother    Colon cancer Maternal Grandmother    Heart Problems Paternal Grandmother    Dementia Paternal Grandmother    Heart attack Paternal Grandfather    Esophageal cancer Neg Hx    Rectal cancer Neg Hx    Stomach cancer Neg Hx    Social History   Socioeconomic History   Marital status: Legally Separated    Spouse name: Not on file   Number of children:  1   Years of education: Not on file   Highest education level: Bachelor's degree (e.g., BA, AB, BS)  Occupational History   Occupation: Disabled  Tobacco Use   Smoking status: Never   Smokeless tobacco: Never  Vaping Use   Vaping status: Never Used  Substance and Sexual Activity   Alcohol  use: No    Alcohol /week: 0.0 standard drinks of alcohol    Drug use: No    Comment: rx opiods   Sexual activity: Yes    Birth control/protection: Condom  Other Topics Concern   Not on file  Social History Narrative   Not on file   Social Drivers of Health   Financial Resource Strain: High Risk (03/14/2023)   Overall Financial Resource Strain (CARDIA)    Difficulty of Paying Living Expenses: Very hard  Food Insecurity: No Food Insecurity (05/31/2023)   Hunger Vital Sign    Worried About Running Out of Food in the Last Year: Never true    Ran Out of Food in the Last Year: Never true  Recent Concern: Food Insecurity - Food Insecurity Present (03/14/2023)   Hunger Vital Sign    Worried About Running Out of Food in the Last Year: Often true    Ran Out of Food in the Last Year: Often true  Transportation Needs: No Transportation Needs (05/31/2023)   PRAPARE - Administrator, Civil Service (Medical): No    Lack of Transportation (Non-Medical): No  Physical Activity: Inactive (03/14/2023)   Exercise Vital Sign    Days of Exercise per Week: 1 day    Minutes of Exercise per Session: 0 min  Stress: Stress Concern Present (03/14/2023)   [employer] of Occupational Health - Occupational Stress Questionnaire    Feeling of Stress : Very much  Social Connections: Socially Isolated (03/14/2023)   Social Connection and Isolation Panel    Frequency of Communication with Friends and Family: Once a week    Frequency of Social Gatherings with Friends and Family: Once a week    Attends Religious Services: Never    Database Administrator or Organizations: No    Attends Hospital Doctor: Not on file    Marital Status: Separated   Past Surgical History:  Procedure Laterality Date   ABDOMINOPLASTY     CHOLECYSTECTOMY     COLONOSCOPY     Past Surgical History:  Procedure Laterality Date   ABDOMINOPLASTY     CHOLECYSTECTOMY     COLONOSCOPY     Past Medical History:  Diagnosis Date   Anxiety    Cataract    DEPRESSION/ANXIETY 08/08/2009   Diabetes mellitus without complication (HCC)    GERD (gastroesophageal reflux disease)    Hyperlipidemia    high triglycerides   IBS 02/02/2010   Neuropathy    Polyneuropathy 12/04/2010   Renal stones    Sleep apnea 05/2021   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) 08/08/2009   BP 139/83 (BP Location: Left Arm, Patient Position: Sitting, Cuff Size: Normal)   Pulse 96   Wt 264 lb (119.7 kg) Comment: last documented weight  LMP 05/06/2018   SpO2 99%   BMI 35.80 kg/m   Opioid Risk Score:   Fall Risk Score:  `1  Depression screen [facility] 2/9     02/04/2024    4:13 PM 01/20/2024    3:28 PM 10/15/2023    3:07 PM 08/22/2023    2:55 PM 06/28/2023    3:08 PM 06/17/2023    2:27 PM 05/31/2023    1:33 PM  Depression screen PHQ 2/9  Decreased Interest 2 0 0 0 1 1 1   Down, Depressed, Hopeless 1 0 0 0 1 1 1   PHQ - 2 Score 3 0 0 0 2 2 2   Altered sleeping 3     3 3   Tired, decreased energy 3     3 3   Change in appetite 0     0 0  Feeling bad or failure about yourself  3     3 1   Trouble concentrating 0     1 0  Moving slowly or fidgety/restless 0     0 0  Suicidal thoughts 0     0 0  PHQ-9 Score 12     12  9    Difficult doing work/chores Somewhat difficult           Data saved with a previous flowsheet row definition      Review of Systems  Musculoskeletal:  Positive for arthralgias, back pain and myalgias.       Low back pain, bilateral hand and foot pain  All other systems reviewed and are negative.      Objective:   Physical  Exam Vitals and nursing note reviewed.  Constitutional:      Appearance: Normal appearance. She is obese.  Cardiovascular:     Rate and Rhythm: Normal rate and regular rhythm.  Pulses: Normal pulses.     Heart sounds: Normal heart sounds.  Pulmonary:     Effort: Pulmonary effort is normal.     Breath sounds: Normal breath sounds.  Musculoskeletal:     Comments: Normal Muscle Bulk and Muscle Testing Reveals:  Upper Extremities: Full ROM and Muscle Strength 5/5  Lower Extremities: Full ROM and Muscle Strength 5/5 Arises from Table Slowly  Narrow Based  Gait     Skin:    General: Skin is warm and dry.  Neurological:     Mental Status: She is alert and oriented to person, place, and time.  Psychiatric:        Mood and Affect: Mood normal.        Behavior: Behavior normal.          Assessment & Plan:  1.Severe peripheral polyneuropathy of undetermined etiology.: 02/13/2024 Refilled: MS Contin  30 mg one tablet every 12 hours #60 and  MSIR 30 mg one tablet at HS, may take a tablet during the day as needed #60.We will continue the opioid monitoring program, this consists of regular clinic visits, examinations, urine drug screen, pill counts as well as use of [place]  Controlled Substance Reporting system. A 12 month History has been reviewed on the [place]  Controlled Substance Reporting System on 02/13/2024 2. Bilateral progressive upper extremity hand pain/ Polyneuropathy. Continue with current medication regime. Tight Glucose Control. Adhere to healthy diet regime. Continue with current medication regimen with nortriptyline : 02/13/2024 3. Muscle Spasms: Continue current medication regimen with  Zanaflex . Continue to Monitor.  02/13/2024 4. Depression/Anxiety: Continue current medication regimen with Paxil  and  Klonopin . Continue to Monitor. 02/13/2024  5. Left Foot Wound: No complaints today. Wound Care Following. Continue to Monitor. 02/13/2024   F/U in 1 month

## 2024-02-19 ENCOUNTER — Telehealth: Payer: Self-pay

## 2024-02-19 NOTE — Telephone Encounter (Signed)
 Called and LVM informing patient of upcoming MRI.  Cena JONELLE Pesa, CMA

## 2024-02-21 DIAGNOSIS — E1140 Type 2 diabetes mellitus with diabetic neuropathy, unspecified: Secondary | ICD-10-CM

## 2024-02-21 DIAGNOSIS — G894 Chronic pain syndrome: Secondary | ICD-10-CM

## 2024-02-24 ENCOUNTER — Other Ambulatory Visit: Payer: Self-pay | Admitting: Family Medicine

## 2024-02-24 ENCOUNTER — Telehealth: Payer: Self-pay

## 2024-02-24 DIAGNOSIS — E1159 Type 2 diabetes mellitus with other circulatory complications: Secondary | ICD-10-CM

## 2024-02-24 DIAGNOSIS — F325 Major depressive disorder, single episode, in full remission: Secondary | ICD-10-CM

## 2024-02-24 NOTE — Telephone Encounter (Signed)
 PA for Morphine  submitted on Cover my meds

## 2024-02-26 MED ORDER — MORPHINE SULFATE 30 MG PO TABS: ORAL_TABLET | ORAL | 0 refills | Status: AC

## 2024-02-26 MED ORDER — MORPHINE SULFATE ER 30 MG PO TBCR: 30.0000 mg | EXTENDED_RELEASE_TABLET | Freq: Two times a day (BID) | ORAL | 0 refills | Status: AC

## 2024-02-26 NOTE — Telephone Encounter (Signed)
 Approved on December 1 by [facility] 2017 NCPDP Request Reference Number: [id number]. MORPHINE  SUL TAB 30MG  ER is approved through 05/24/2024. For further questions, call [employer] at [phone]. Effective Date: 02/24/2024 Authorization Expiration Date: 05/24/2024

## 2024-02-26 NOTE — Telephone Encounter (Signed)
 Addressed via my chart, please seen in encounters

## 2024-02-26 NOTE — Telephone Encounter (Signed)
 Requested Prescriptions   Pending Prescriptions Disp Refills   morphine  (MSIR) 30 MG tablet 60 tablet 0    Sig: One tablet at HS, may take a tablet during the day.   morphine  (MS CONTIN ) 30 MG 12 hr tablet 60 tablet 0    Sig: Take 1 tablet (30 mg total) by mouth every 12 (twelve) hours.     Date of patient request: 02/21/2024 Last office visit: 02/13/2024 Upcoming visit: 03/13/2024 Date of last refill: 02/13/2024 Last refill amount: #60 0 refills

## 2024-02-27 ENCOUNTER — Ambulatory Visit: Admitting: Obstetrics and Gynecology

## 2024-02-27 ENCOUNTER — Other Ambulatory Visit (HOSPITAL_COMMUNITY)
Admission: RE | Admit: 2024-02-27 | Discharge: 2024-02-27 | Disposition: A | Source: Ambulatory Visit | Attending: Obstetrics and Gynecology | Admitting: Obstetrics and Gynecology

## 2024-02-27 ENCOUNTER — Encounter: Payer: Self-pay | Admitting: Obstetrics and Gynecology

## 2024-02-27 VITALS — BP 119/80 | HR 108 | Ht 72.0 in | Wt 265.0 lb

## 2024-02-27 DIAGNOSIS — R935 Abnormal findings on diagnostic imaging of other abdominal regions, including retroperitoneum: Secondary | ICD-10-CM

## 2024-02-27 DIAGNOSIS — Z01411 Encounter for gynecological examination (general) (routine) with abnormal findings: Secondary | ICD-10-CM | POA: Diagnosis not present

## 2024-02-27 DIAGNOSIS — N858 Other specified noninflammatory disorders of uterus: Secondary | ICD-10-CM | POA: Diagnosis not present

## 2024-02-27 NOTE — Patient Instructions (Addendum)
 [email or web address]  It was nice meeting you today! You will see your results in the MyChart app within 1 week  It is normal to have cramping and bleeding for the next 2-3 days. You should feel better every day.  Please call us  if you have any severe pain, bleeding that soaks more than 1 pad in a hour, have fevers, or feel like you're going to pass out.  You can take tylenol  and ibuprofen as needed for pain

## 2024-02-27 NOTE — Progress Notes (Signed)
 ANNUAL EXAM Patient name: [name] MRN [id number]  Date of birth: [birth year] Chief Complaint:   New Patient (Initial Visit) (GYN)  History of Present Illness:   [name] is a 53 y.o. [phone] with Patient's last menstrual period was 05/06/2018. being seen today for a routine annual exam.  Current complaints:   Episode of PMB 2023. EL 6mm > EMB proliferative endometrium. No bleeding or follow up since.  Decreased libido  Last pap 01/15/22. Results were: ASCUS w/ HRHPV negative. H/O abnormal pap: yes as above Last mammogram: 2021. Results were: normal. Last colonoscopy: 12/2023, inadequate prep     02/04/2024    4:13 PM 01/20/2024    3:28 PM 10/15/2023    3:07 PM 08/22/2023    2:55 PM 06/28/2023    3:08 PM  Depression screen PHQ 2/9  Decreased Interest 2 0 0 0 1  Down, Depressed, Hopeless 1 0 0 0 1  PHQ - 2 Score 3 0 0 0 2  Altered sleeping 3      Tired, decreased energy 3      Change in appetite 0      Feeling bad or failure about yourself  3      Trouble concentrating 0      Moving slowly or fidgety/restless 0      Suicidal thoughts 0      PHQ-9 Score 12      Difficult doing work/chores Somewhat difficult             No data to display         Review of Systems:   Pertinent items are noted in HPI Denies any headaches, blurred vision, fatigue, shortness of breath, chest pain, abdominal pain, abnormal vaginal discharge/itching/odor/irritation, problems with periods, bowel movements, urination, or intercourse unless otherwise stated above. Pertinent History Reviewed:  Reviewed past medical,surgical, social and family history.  Reviewed problem list, medications and allergies. Physical Assessment:   Vitals:   02/27/24 1522  BP: 119/80  Pulse: (!) 108  Weight: 265 lb (120.2 kg)  Height: 6' (1.829 m)  Body mass index is 35.94 kg/m.        Physical Examination:   General appearance - well appearing, and in no distress  Mental status - alert,  oriented to person, place, and time  Chest - respiratory effort normal  Heart - normal peripheral perfusion  Breasts - deferred  Abdomen - soft, nontender, nondistended, no masses or organomegaly  Pelvic - VULVA: normal appearing vulva with no masses, tenderness or lesions  VAGINA: normal appearing vagina with normal color and discharge, no lesions  CERVIX: normal appearing cervix without discharge or lesions, no CMT  UTERUS: uterus is felt to be normal size, shape, consistency and nontender   ADNEXA: No adnexal masses or tenderness noted.  Chaperone present for exam  No results found. However, due to the size of the patient record, not all encounters were searched. Please check Results Review for a complete set of results.  Assessment & Plan:  1) Well-Woman Exam Mammogram: scheduled 12/9 Colonoscopy: needs repeat CSY due to inadequate prep Pap: Due 12/2024 HIV/HCV: UTD  2) Low libido Discussed multifactorial causes. Declines vaginal estrogen for now. Will start with AASECT and follow up prn  3) Proliferative endometrium Discussed elevated risk of EIN/endometrial cancer in s/o BMI, DM, and postmenopausal biopsy with proliferative endometrium Offered expectant management (no bleeding, working on [employer]), repeat biopsy today, or active management with PO progestins or IUD. She opts for  repeat biopsy to help determine next steps  Follow-up: Return for pending biopsy results.  Kieth JAYSON Carolin, MD 02/27/2024 4:12 PM

## 2024-02-27 NOTE — Progress Notes (Addendum)
 53 y.o. New GYN presents for AEX/ PAP/ US  follow-up referral for Endometrial Thickening.    US  01/24/2022  PHQ-9=9  // GAD-7=9

## 2024-02-27 NOTE — Progress Notes (Signed)
      GYNECOLOGY OFFICE PROCEDURE NOTE   [name] is a 53 y.o. G2P0011 here for endometrial biopsy for proliferative endometrium   ENDOMETRIAL BIOPSY     The indications for endometrial biopsy were reviewed.   Risks of the biopsy including cramping, bleeding, infection, uterine perforation, inadequate specimen and need for additional procedures were discussed. Offered alternative of hysteroscopy, dilation and curettage in OR. The patient states she understands the R/B/I/A and agrees to undergo procedure today. Urine pregnancy test was Not indicated. Consent was signed. Time out was performed.    Patient was positioned in dorsal lithotomy position. A vaginal speculum was placed.  The cervix was visualized and was prepped with Betadine.  A single-toothed tenaculum was placed on the anterior lip of the cervix to stabilize it. The 3 mm pipelle was easily introduced into the endometrial cavity without difficulty to a depth of 7 cm, and a Moderate amount of tissue was obtained after two passes and sent to pathology. The instruments were removed from the patient's vagina. Minimal bleeding from the cervix was noted. The patient tolerated the procedure well.   Patient was given post procedure instructions.  Will follow up pathology and manage accordingly; patient will be contacted with results and recommendations.  Routine preventative health maintenance measures emphasized.     Kieth Carolin, MD Obstetrician & Gynecologist, [facility] for [employer], [facility]

## 2024-02-28 ENCOUNTER — Ambulatory Visit (HOSPITAL_COMMUNITY)

## 2024-03-02 ENCOUNTER — Ambulatory Visit: Payer: Self-pay | Admitting: Obstetrics and Gynecology

## 2024-03-02 LAB — SURGICAL PATHOLOGY

## 2024-03-03 ENCOUNTER — Ambulatory Visit: Admitting: Behavioral Health

## 2024-03-03 ENCOUNTER — Inpatient Hospital Stay: Admission: RE | Admit: 2024-03-03 | Source: Ambulatory Visit

## 2024-03-06 ENCOUNTER — Telehealth: Payer: Self-pay | Admitting: Physical Medicine & Rehabilitation

## 2024-03-06 ENCOUNTER — Telehealth: Payer: Self-pay

## 2024-03-06 ENCOUNTER — Encounter: Payer: Self-pay | Admitting: Physician Assistant

## 2024-03-06 NOTE — Telephone Encounter (Signed)
 [name] (Key: [place]) PA Case ID #: [id number] Rx #: [id number] Need Help? Call us  at [phone] Status sent iconSent to Plan today Drug Morphine  Sulfate 30MG  tablets ePA cloud logo Form [employer] Electronic Prior Authorization Form [phone] NCPDP) Original Claim Info [phone] Unauthorized DEA ClassMME 90.00 exceeded;Ttl MME 120.00MG Maximum Days Supply of 5New Prescription RequiredMaximum Days Supply of 5DUR1:PA Required MD Call [phone]:DDI:Benzo+Opioid ENTER N

## 2024-03-06 NOTE — Telephone Encounter (Signed)
 Patient called and said a prior shara is needed for medication. She states the wrong one keeps getting sent in and the correct one should be morphine  -- immediate release (IR)

## 2024-03-09 NOTE — Telephone Encounter (Signed)
 Lvm for patient letting her know the medication had been approved asked her to give us  a call with anything else she needed.

## 2024-03-09 NOTE — Telephone Encounter (Signed)
 Multiple messages are in this patient's chart regarding the same thing. Please see message from 12/15

## 2024-03-13 ENCOUNTER — Ambulatory Visit (HOSPITAL_COMMUNITY)

## 2024-03-13 ENCOUNTER — Encounter: Attending: Physical Medicine & Rehabilitation | Admitting: Registered Nurse

## 2024-03-13 ENCOUNTER — Encounter: Payer: Self-pay | Admitting: Registered Nurse

## 2024-03-13 VITALS — BP 127/75 | HR 102 | Ht 72.0 in

## 2024-03-13 DIAGNOSIS — G894 Chronic pain syndrome: Secondary | ICD-10-CM | POA: Insufficient documentation

## 2024-03-13 DIAGNOSIS — E1140 Type 2 diabetes mellitus with diabetic neuropathy, unspecified: Secondary | ICD-10-CM | POA: Diagnosis present

## 2024-03-13 DIAGNOSIS — Z5181 Encounter for therapeutic drug level monitoring: Secondary | ICD-10-CM | POA: Insufficient documentation

## 2024-03-13 DIAGNOSIS — Z79891 Long term (current) use of opiate analgesic: Secondary | ICD-10-CM | POA: Insufficient documentation

## 2024-03-13 DIAGNOSIS — R000 Tachycardia, unspecified: Secondary | ICD-10-CM | POA: Insufficient documentation

## 2024-03-13 DIAGNOSIS — F341 Dysthymic disorder: Secondary | ICD-10-CM | POA: Diagnosis present

## 2024-03-13 MED ORDER — MORPHINE SULFATE ER 30 MG PO TBCR: 30.0000 mg | EXTENDED_RELEASE_TABLET | Freq: Two times a day (BID) | ORAL | 0 refills | Status: DC

## 2024-03-13 MED ORDER — MORPHINE SULFATE 30 MG PO TABS: ORAL_TABLET | ORAL | 0 refills | Status: DC

## 2024-03-13 NOTE — Progress Notes (Signed)
 "  Subjective:    Patient ID: [name], female    DOB: [birth year], 53 y.o.   MRN: [id number]  HPI: [name] is a 53 y.o. female who returns for follow up appointment for chronic pain and medication refill. She states her pain is located in her bilateral hands and bilateral feet with tingling and burning. She rates her pain 6. Her current exercise regime is walking and performing stretching exercises.  [name] Morphine  equivalent is 120.00 MME. She is also prescribed Clonazepam  .We have discussed the black box warning of using opioids and benzodiazepines. I highlighted the dangers of using these drugs together and discussed the adverse events including respiratory suppression, overdose, cognitive impairment and importance of compliance with current regimen. We will continue to monitor and adjust as indicated.     Last Oral Swab was Performed on 01/20/2024, it was consistent.     Pain Inventory Average Pain 7 Pain Right Now 6 My pain is constant, sharp, burning, dull, stabbing, tingling, and aching  In the last 24 hours, has pain interfered with the following? General activity 9 Relation with others 9 Enjoyment of life 9 What TIME of day is your pain at its worst? na Sleep (in general) Poor  Pain is worse with: walking, bending, and standing Pain improves with: rest and medication Relief from Meds: 8  Family History  Problem Relation Age of Onset   Stroke Mother    Fibromyalgia Mother    Aortic aneurysm Mother    Stroke Father    Colon polyps Brother    Cancer Maternal Grandmother    Stroke Maternal Grandmother    Colon cancer Maternal Grandmother    Heart Problems Paternal Grandmother    Dementia Paternal Grandmother    Heart attack Paternal Grandfather    Esophageal cancer Neg Hx    Rectal cancer Neg Hx    Stomach cancer Neg Hx    Social History   Socioeconomic History   Marital status: Legally Separated    Spouse name: Not on file   Number  of children: 1   Years of education: Not on file   Highest education level: Bachelor's degree (e.g., BA, AB, BS)  Occupational History   Occupation: Disabled  Tobacco Use   Smoking status: Never   Smokeless tobacco: Never  Vaping Use   Vaping status: Never Used  Substance and Sexual Activity   Alcohol  use: No    Alcohol /week: 0.0 standard drinks of alcohol    Drug use: No    Comment: rx opiods   Sexual activity: Yes    Birth control/protection: Condom  Other Topics Concern   Not on file  Social History Narrative   Not on file   Social Drivers of Health   Tobacco Use: Low Risk (03/13/2024)   Patient History    Smoking Tobacco Use: Never    Smokeless Tobacco Use: Never    Passive Exposure: Not on file  Financial Resource Strain: High Risk (03/14/2023)   Overall Financial Resource Strain (CARDIA)    Difficulty of Paying Living Expenses: Very hard  Food Insecurity: No Food Insecurity (05/31/2023)   Hunger Vital Sign    Worried About Running Out of Food in the Last Year: Never true    Ran Out of Food in the Last Year: Never true  Recent Concern: Food Insecurity - Food Insecurity Present (03/14/2023)   Hunger Vital Sign    Worried About Running Out of Food in the Last Year: Often true  Ran Out of Food in the Last Year: Often true  Transportation Needs: No Transportation Needs (05/31/2023)   PRAPARE - Administrator, Civil Service (Medical): No    Lack of Transportation (Non-Medical): No  Physical Activity: Inactive (03/14/2023)   Exercise Vital Sign    Days of Exercise per Week: 1 day    Minutes of Exercise per Session: 0 min  Stress: Stress Concern Present (03/14/2023)   [employer] of Occupational Health - Occupational Stress Questionnaire    Feeling of Stress : Very much  Social Connections: Socially Isolated (03/14/2023)   Social Connection and Isolation Panel    Frequency of Communication with Friends and Family: Once a week    Frequency of Social  Gatherings with Friends and Family: Once a week    Attends Religious Services: Never    Database Administrator or Organizations: No    Attends Engineer, Structural: Not on file    Marital Status: Separated  Depression (PHQ2-9): Low Risk (03/13/2024)   Depression (PHQ2-9)    PHQ-2 Score: 0  Recent Concern: Depression (PHQ2-9) - High Risk (02/04/2024)   Depression (PHQ2-9)    PHQ-2 Score: 12  Alcohol  Screen: Not on file  Housing: Low Risk (05/31/2023)   Housing Stability Vital Sign    Unable to Pay for Housing in the Last Year: No    Number of Times Moved in the Last Year: 0    Homeless in the Last Year: No  Utilities: Not At Risk (05/31/2023)   AHC Utilities    Threatened with loss of utilities: No  Health Literacy: Not on file   Past Surgical History:  Procedure Laterality Date   ABDOMINOPLASTY     CHOLECYSTECTOMY     COLONOSCOPY     Past Surgical History:  Procedure Laterality Date   ABDOMINOPLASTY     CHOLECYSTECTOMY     COLONOSCOPY     Past Medical History:  Diagnosis Date   Anxiety    Cataract    DEPRESSION/ANXIETY 08/08/2009   Diabetes mellitus without complication (HCC)    GERD (gastroesophageal reflux disease)    Hyperlipidemia    high triglycerides   IBS 02/02/2010   Neuropathy    Polyneuropathy 12/04/2010   Renal stones    Sleep apnea 05/2021   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) 08/08/2009   Vaginal Pap smear, abnormal    BP 127/75   Pulse (!) 102   Ht 6' (1.829 m)   LMP 05/06/2018   SpO2 98%   BMI 35.94 kg/m   Opioid Risk Score:   Fall Risk Score:  `1  Depression screen PHQ 2/9     03/13/2024    2:40 PM 02/04/2024    4:13 PM 01/20/2024    3:28 PM 10/15/2023    3:07 PM 08/22/2023    2:55 PM 06/28/2023    3:08 PM 06/17/2023    2:27 PM  Depression screen PHQ 2/9  Decreased Interest 0 2 0 0 0 1 1  Down, Depressed, Hopeless 0 1 0 0 0 1 1  PHQ - 2 Score 0 3  0 0 0 2 2  Altered sleeping  3     3  Tired, decreased energy  3     3  Change in appetite  0     0  Feeling bad or failure about yourself   3     3  Trouble concentrating  0  1  Moving slowly or fidgety/restless  0     0  Suicidal thoughts  0     0  PHQ-9 Score  12     12   Difficult doing work/chores  Somewhat difficult          Data saved with a previous flowsheet row definition     Review of Systems  Musculoskeletal:        Bil hands and feet       Objective:   Physical Exam Vitals and nursing note reviewed.  Constitutional:      Appearance: Normal appearance. She is obese.  Cardiovascular:     Rate and Rhythm: Regular rhythm. Tachycardia present.     Pulses: Normal pulses.     Heart sounds: Normal heart sounds.  Pulmonary:     Effort: Pulmonary effort is normal.     Breath sounds: Normal breath sounds.  Musculoskeletal:     Comments: Normal Muscle Bulk and Muscle Testing Reveals:  Upper Extremities: Full ROM and Muscle Strength 5/5 Lower Extremities: Full ROM and Muscle Strength 5/5 Arises from Table with ease Narrow Based  Gait     Skin:    General: Skin is warm and dry.  Neurological:     Mental Status: She is alert and oriented to person, place, and time.  Psychiatric:        Mood and Affect: Mood normal.        Behavior: Behavior normal.           Assessment & Plan:  1.Severe peripheral polyneuropathy of undetermined etiology.: 03/13/2024 Refilled: MS Contin  30 mg one tablet every 12 hours #60 and  MSIR 30 mg one tablet at HS, may take a tablet during the day as needed #60.We will continue the opioid monitoring program, this consists of regular clinic visits, examinations, urine drug screen, pill counts as well as use of [place]  Controlled Substance Reporting system. A 12 month History has been reviewed on the [place]  Controlled Substance Reporting System on 03/13/2024 2. Bilateral progressive upper extremity hand pain/ Polyneuropathy.  Continue with current medication regime. Tight Glucose Control. Adhere to healthy diet regime. Continue with current medication regimen with nortriptyline : 03/13/2024 3. Muscle Spasms: Continue current medication regimen with  Zanaflex . Continue to Monitor.  03/13/2024 4. Depression/Anxiety: Continue current medication regimen with Paxil  and  Klonopin . Continue to Monitor. 03/13/2024  5. Left Foot Wound: No complaints today. Wound Care Following. Continue to Monitor. 03/13/2024   F/U in 1 month    "

## 2024-03-15 ENCOUNTER — Other Ambulatory Visit: Payer: Self-pay | Admitting: Family Medicine

## 2024-03-15 DIAGNOSIS — E118 Type 2 diabetes mellitus with unspecified complications: Secondary | ICD-10-CM

## 2024-03-25 ENCOUNTER — Telehealth: Payer: Self-pay

## 2024-03-25 NOTE — Telephone Encounter (Signed)
 Pharmacy Patient Advocate Encounter   Received notification from CoverMyMeds that prior authorization for FREESTYLE LIBRE 3 PLUS SENSOR is required/requested.   Insurance verification completed.   The patient is insured through [facility] MEDICAID.   Per test claim: PA required; PA submitted to above mentioned insurance via Latent Key/confirmation #/EOC [id number]. Status is pending  No recent CGM chart notes to attach for renewal.

## 2024-03-27 NOTE — Telephone Encounter (Signed)
 Pharmacy Patient Advocate Encounter  Received notification from [facility] MEDICAID that Prior Authorization for FREESTYLE LIBRE 3 PLUS SENSOR has been DENIED.  Full denial letter will be uploaded to the media tab. See denial reason below.  Per your health plan's criteria, this product is covered if you meet the following:   If this is the first reauthorization request, one of the following:   (I) You have been able to improve your blood sugar levels (glycemic control).  (II) You continue to use an external insulin  pump.   Patient will need an appt.  PA #/Case ID/Reference #: [id number]

## 2024-03-30 ENCOUNTER — Telehealth: Payer: Self-pay | Admitting: Registered Nurse

## 2024-03-30 DIAGNOSIS — E1140 Type 2 diabetes mellitus with diabetic neuropathy, unspecified: Secondary | ICD-10-CM

## 2024-03-30 DIAGNOSIS — G894 Chronic pain syndrome: Secondary | ICD-10-CM

## 2024-03-30 MED ORDER — MORPHINE SULFATE 30 MG PO TABS: ORAL_TABLET | ORAL | 0 refills | Status: DC

## 2024-03-30 NOTE — Telephone Encounter (Signed)
 PDMP was reviewed.  MSIR e-scribed to pharmacy. Ms. Zoeller is aware via My Chart

## 2024-03-31 ENCOUNTER — Ambulatory Visit

## 2024-04-10 ENCOUNTER — Ambulatory Visit (HOSPITAL_BASED_OUTPATIENT_CLINIC_OR_DEPARTMENT_OTHER)

## 2024-04-15 ENCOUNTER — Encounter: Admitting: Registered Nurse

## 2024-04-21 ENCOUNTER — Telehealth: Payer: Self-pay | Admitting: Registered Nurse

## 2024-04-21 NOTE — Telephone Encounter (Signed)
 Patient called stating that all [facility] locations are closed and she is unable to obtain her medication. She is requesting information on any pharmacies that may be open so the prescription can be sent there for pickup.

## 2024-04-22 ENCOUNTER — Telehealth: Payer: Self-pay | Admitting: Registered Nurse

## 2024-04-22 ENCOUNTER — Encounter: Admitting: Registered Nurse

## 2024-04-22 DIAGNOSIS — F341 Dysthymic disorder: Secondary | ICD-10-CM

## 2024-04-22 DIAGNOSIS — G894 Chronic pain syndrome: Secondary | ICD-10-CM

## 2024-04-22 DIAGNOSIS — E1140 Type 2 diabetes mellitus with diabetic neuropathy, unspecified: Secondary | ICD-10-CM

## 2024-04-22 MED ORDER — CLONAZEPAM 0.5 MG PO TABS: ORAL_TABLET | ORAL | 3 refills | Status: AC

## 2024-04-22 MED ORDER — MORPHINE SULFATE ER 30 MG PO TBCR: 30.0000 mg | EXTENDED_RELEASE_TABLET | Freq: Two times a day (BID) | ORAL | 0 refills | Status: DC

## 2024-04-22 MED ORDER — MORPHINE SULFATE 30 MG PO TABS: ORAL_TABLET | ORAL | 0 refills | Status: DC

## 2024-04-22 NOTE — Telephone Encounter (Signed)
 PDMP was Reviewed.  MS Contin , MSIR and Clonazepam  prescription sent to pharmacy.  Ms. Shain is aware via My-Chart

## 2024-04-22 NOTE — Telephone Encounter (Signed)
 Pt call in for med ref morphine  30 mg immediate release, and morphine  extended release

## 2024-04-24 ENCOUNTER — Telehealth: Payer: Self-pay

## 2024-04-24 NOTE — Telephone Encounter (Signed)
 Received call from Kristi at [facility] regarding upcoming MRI on 05/04/24.  Per appt note, MRI was approved at [facility]. She is asking if authorization has been updated to [facility] or if patient needs to be rescheduled at [facility].   Advised her that I would reach out to our staff that handles prior auths for imaging.   Call back number for [place] is [phone] ext: [phone]  Chiquita JAYSON English, RN

## 2024-04-29 ENCOUNTER — Other Ambulatory Visit: Payer: Self-pay | Admitting: Family Medicine

## 2024-04-29 DIAGNOSIS — E118 Type 2 diabetes mellitus with unspecified complications: Secondary | ICD-10-CM

## 2024-04-30 ENCOUNTER — Other Ambulatory Visit: Payer: Self-pay

## 2024-05-01 ENCOUNTER — Encounter: Attending: Physical Medicine & Rehabilitation | Admitting: Registered Nurse

## 2024-05-01 VITALS — BP 111/76 | HR 92 | Ht 72.0 in | Wt 259.0 lb

## 2024-05-01 DIAGNOSIS — G894 Chronic pain syndrome: Secondary | ICD-10-CM

## 2024-05-01 DIAGNOSIS — F341 Dysthymic disorder: Secondary | ICD-10-CM

## 2024-05-01 DIAGNOSIS — G8929 Other chronic pain: Secondary | ICD-10-CM

## 2024-05-01 DIAGNOSIS — M545 Low back pain, unspecified: Secondary | ICD-10-CM

## 2024-05-01 DIAGNOSIS — Z5181 Encounter for therapeutic drug level monitoring: Secondary | ICD-10-CM

## 2024-05-01 DIAGNOSIS — E1140 Type 2 diabetes mellitus with diabetic neuropathy, unspecified: Secondary | ICD-10-CM

## 2024-05-01 DIAGNOSIS — Z79891 Long term (current) use of opiate analgesic: Secondary | ICD-10-CM

## 2024-05-01 MED ORDER — MORPHINE SULFATE ER 30 MG PO TBCR: 30.0000 mg | EXTENDED_RELEASE_TABLET | Freq: Two times a day (BID) | ORAL | 0 refills | Status: AC

## 2024-05-01 MED ORDER — MORPHINE SULFATE 30 MG PO TABS: ORAL_TABLET | ORAL | 0 refills | Status: AC

## 2024-05-01 MED ORDER — NORTRIPTYLINE HCL 50 MG PO CAPS: 50.0000 mg | ORAL_CAPSULE | Freq: Every day | ORAL | 3 refills | Status: AC

## 2024-05-01 NOTE — Progress Notes (Unsigned)
 "  Subjective:    Patient ID: [name], female    DOB: [birth year], 54 y.o.   MRN: [id number]  HPI: [name] is a 54 y.o. female who returns for follow up appointment for chronic pain and medication refill. states *** pain is located in  ***. rates pain ***. current exercise regime is walking and performing stretching exercises.  [name] Morphine  equivalent is *** MME.   Last Oral Swab was Performed on 01/19/2025, it was consistent     Pain Inventory Average Pain 6 Pain Right Now 8 My pain is constant, sharp, burning, dull, stabbing, tingling, and aching  In the last 24 hours, has pain interfered with the following? General activity 8 Relation with others 8 Enjoyment of life 8 What TIME of day is your pain at its worst? varies Sleep (in general) Poor  Pain is worse with: walking, bending, sitting, standing, unsure, and some activites Pain improves with: rest and medication Relief from Meds: 7  Family History  Problem Relation Age of Onset   Stroke Mother    Fibromyalgia Mother    Aortic aneurysm Mother    Stroke Father    Colon polyps Brother    Cancer Maternal Grandmother    Stroke Maternal Grandmother    Colon cancer Maternal Grandmother    Heart Problems Paternal Grandmother    Dementia Paternal Grandmother    Heart attack Paternal Grandfather    Esophageal cancer Neg Hx    Rectal cancer Neg Hx    Stomach cancer Neg Hx    Social History   Socioeconomic History   Marital status: Legally Separated    Spouse name: Not on file   Number of children: 1   Years of education: Not on file   Highest education level: Bachelor's degree (e.g., BA, AB, BS)  Occupational History   Occupation: Disabled  Tobacco Use   Smoking status: Never   Smokeless tobacco: Never  Vaping Use   Vaping status: Never Used  Substance and Sexual Activity   Alcohol  use: No    Alcohol /week: 0.0 standard drinks of alcohol    Drug use: No    Comment: rx opiods    Sexual activity: Yes    Birth control/protection: Condom  Other Topics Concern   Not on file  Social History Narrative   Not on file   Social Drivers of Health   Tobacco Use: Low Risk (03/13/2024)   Patient History    Smoking Tobacco Use: Never    Smokeless Tobacco Use: Never    Passive Exposure: Not on file  Financial Resource Strain: High Risk (03/14/2023)   Overall Financial Resource Strain (CARDIA)    Difficulty of Paying Living Expenses: Very hard  Food Insecurity: No Food Insecurity (05/31/2023)   Hunger Vital Sign    Worried About Running Out of Food in the Last Year: Never true    Ran Out of Food in the Last Year: Never true  Recent Concern: Food Insecurity - Food Insecurity Present (03/14/2023)   Hunger Vital Sign    Worried About Running Out of Food in the Last Year: Often true    Ran Out of Food in the Last Year: Often true  Transportation Needs: No Transportation Needs (05/31/2023)   PRAPARE - Administrator, Civil Service (Medical): No    Lack of Transportation (Non-Medical): No  Physical Activity: Inactive (03/14/2023)   Exercise Vital Sign    Days of Exercise per Week: 1 day    Minutes  of Exercise per Session: 0 min  Stress: Stress Concern Present (03/14/2023)   [employer] of Occupational Health - Occupational Stress Questionnaire    Feeling of Stress : Very much  Social Connections: Socially Isolated (03/14/2023)   Social Connection and Isolation Panel    Frequency of Communication with Friends and Family: Once a week    Frequency of Social Gatherings with Friends and Family: Once a week    Attends Religious Services: Never    Database Administrator or Organizations: No    Attends Engineer, Structural: Not on file    Marital Status: Separated  Depression (PHQ2-9): Low Risk (03/13/2024)   Depression (PHQ2-9)    PHQ-2 Score: 0  Recent Concern: Depression (PHQ2-9) - High Risk (02/04/2024)   Depression (PHQ2-9)    PHQ-2 Score: 12   Alcohol  Screen: Not on file  Housing: Low Risk (05/31/2023)   Housing Stability Vital Sign    Unable to Pay for Housing in the Last Year: No    Number of Times Moved in the Last Year: 0    Homeless in the Last Year: No  Utilities: Not At Risk (05/31/2023)   AHC Utilities    Threatened with loss of utilities: No  Health Literacy: Not on file   Past Surgical History:  Procedure Laterality Date   ABDOMINOPLASTY     CHOLECYSTECTOMY     COLONOSCOPY     Past Surgical History:  Procedure Laterality Date   ABDOMINOPLASTY     CHOLECYSTECTOMY     COLONOSCOPY     Past Medical History:  Diagnosis Date   Anxiety    Cataract    DEPRESSION/ANXIETY 08/08/2009   Diabetes mellitus without complication (HCC)    GERD (gastroesophageal reflux disease)    Hyperlipidemia    high triglycerides   IBS 02/02/2010   Neuropathy    Polyneuropathy 12/04/2010   Renal stones    Sleep apnea 05/2021   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) 08/08/2009   Vaginal Pap smear, abnormal    LMP 05/06/2018   Opioid Risk Score:   Fall Risk Score:  `1  Depression screen PHQ 2/9     03/13/2024    2:40 PM 02/04/2024    4:13 PM 01/20/2024    3:28 PM 10/15/2023    3:07 PM 08/22/2023    2:55 PM 06/28/2023    3:08 PM 06/17/2023    2:27 PM  Depression screen PHQ 2/9  Decreased Interest 0 2 0 0 0 1 1  Down, Depressed, Hopeless 0 1 0 0 0 1 1  PHQ - 2 Score 0 3 0 0 0 2 2  Altered sleeping  3     3  Tired, decreased energy  3     3  Change in appetite  0     0  Feeling bad or failure about yourself   3     3  Trouble concentrating  0     1  Moving slowly or fidgety/restless  0     0  Suicidal thoughts  0     0  PHQ-9 Score  12     12   Difficult doing work/chores  Somewhat difficult          Data saved with a previous flowsheet row definition    Review of Systems  Musculoskeletal:        Hand and foot pain  All other systems  reviewed and  are negative.      Objective:   Physical Exam        Assessment & Plan:  1.Severe peripheral polyneuropathy of undetermined etiology.: 03/13/2024 Refilled: MS Contin  30 mg one tablet every 12 hours #60 and  MSIR 30 mg one tablet at HS, may take a tablet during the day as needed #60.We will continue the opioid monitoring program, this consists of regular clinic visits, examinations, urine drug screen, pill counts as well as use of [place]  Controlled Substance Reporting system. A 12 month History has been reviewed on the [place]  Controlled Substance Reporting System on 03/13/2024 2. Bilateral progressive upper extremity hand pain/ Polyneuropathy. Continue with current medication regime. Tight Glucose Control. Adhere to healthy diet regime. Continue with current medication regimen with nortriptyline : 03/13/2024 3. Muscle Spasms: Continue current medication regimen with  Zanaflex . Continue to Monitor.  03/13/2024 4. Depression/Anxiety: Continue current medication regimen with Paxil  and  Klonopin . Continue to Monitor. 03/13/2024  5. Left Foot Wound: No complaints today. Wound Care Following. Continue to Monitor. 03/13/2024   F/U in 1 month    "

## 2024-05-04 ENCOUNTER — Other Ambulatory Visit

## 2024-05-06 ENCOUNTER — Ambulatory Visit

## 2024-06-17 ENCOUNTER — Encounter: Admitting: Registered Nurse
